# Patient Record
Sex: Female | Born: 1985 | Race: Black or African American | Hispanic: No | Marital: Single | Smoking: Former smoker
Health system: Southern US, Community
[De-identification: ages and names within clinical notes are randomized; demographics above are authoritative.]

## PROBLEM LIST (undated history)

## (undated) ENCOUNTER — Inpatient Hospital Stay (HOSPITAL_COMMUNITY): Payer: Self-pay

## (undated) ENCOUNTER — Ambulatory Visit (HOSPITAL_COMMUNITY): Admission: EM

## (undated) DIAGNOSIS — R87629 Unspecified abnormal cytological findings in specimens from vagina: Secondary | ICD-10-CM

## (undated) DIAGNOSIS — R569 Unspecified convulsions: Secondary | ICD-10-CM

## (undated) DIAGNOSIS — F419 Anxiety disorder, unspecified: Secondary | ICD-10-CM

## (undated) DIAGNOSIS — B009 Herpesviral infection, unspecified: Secondary | ICD-10-CM

## (undated) DIAGNOSIS — C801 Malignant (primary) neoplasm, unspecified: Secondary | ICD-10-CM

## (undated) DIAGNOSIS — O24419 Gestational diabetes mellitus in pregnancy, unspecified control: Secondary | ICD-10-CM

## (undated) DIAGNOSIS — I739 Peripheral vascular disease, unspecified: Secondary | ICD-10-CM

## (undated) DIAGNOSIS — G4730 Sleep apnea, unspecified: Secondary | ICD-10-CM

## (undated) DIAGNOSIS — K760 Fatty (change of) liver, not elsewhere classified: Secondary | ICD-10-CM

## (undated) DIAGNOSIS — G43909 Migraine, unspecified, not intractable, without status migrainosus: Secondary | ICD-10-CM

## (undated) DIAGNOSIS — K219 Gastro-esophageal reflux disease without esophagitis: Secondary | ICD-10-CM

## (undated) DIAGNOSIS — Z87440 Personal history of urinary (tract) infections: Secondary | ICD-10-CM

## (undated) DIAGNOSIS — Z8719 Personal history of other diseases of the digestive system: Secondary | ICD-10-CM

## (undated) DIAGNOSIS — F32A Depression, unspecified: Secondary | ICD-10-CM

## (undated) DIAGNOSIS — K449 Diaphragmatic hernia without obstruction or gangrene: Secondary | ICD-10-CM

## (undated) HISTORY — DX: Anxiety disorder, unspecified: F41.9

## (undated) HISTORY — DX: Depression, unspecified: F32.A

## (undated) HISTORY — DX: Fatty (change of) liver, not elsewhere classified: K76.0

## (undated) HISTORY — PX: DILATION AND CURETTAGE OF UTERUS: SHX78

## (undated) HISTORY — DX: Personal history of urinary (tract) infections: Z87.440

## (undated) HISTORY — PX: ABDOMINAL HYSTERECTOMY: SHX81

## (undated) HISTORY — DX: Migraine, unspecified, not intractable, without status migrainosus: G43.909

## (undated) HISTORY — DX: Unspecified abnormal cytological findings in specimens from vagina: R87.629

## (undated) HISTORY — DX: Sleep apnea, unspecified: G47.30

## (undated) HISTORY — DX: Diaphragmatic hernia without obstruction or gangrene: K44.9

## (undated) HISTORY — DX: Gestational diabetes mellitus in pregnancy, unspecified control: O24.419

---
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
## 2007-01-03 ENCOUNTER — Emergency Department (HOSPITAL_COMMUNITY): Admission: EM | Admit: 2007-01-03 | Discharge: 2007-01-03 | Payer: Self-pay | Admitting: Emergency Medicine

## 2008-01-11 ENCOUNTER — Emergency Department (HOSPITAL_COMMUNITY): Admission: EM | Admit: 2008-01-11 | Discharge: 2008-01-11 | Payer: Self-pay | Admitting: Emergency Medicine

## 2008-02-06 ENCOUNTER — Inpatient Hospital Stay (HOSPITAL_COMMUNITY): Admission: AD | Admit: 2008-02-06 | Discharge: 2008-02-06 | Payer: Self-pay | Admitting: Obstetrics & Gynecology

## 2008-07-24 ENCOUNTER — Inpatient Hospital Stay (HOSPITAL_COMMUNITY): Admission: AD | Admit: 2008-07-24 | Discharge: 2008-07-24 | Payer: Self-pay | Admitting: Obstetrics and Gynecology

## 2008-08-04 ENCOUNTER — Inpatient Hospital Stay (HOSPITAL_COMMUNITY): Admission: AD | Admit: 2008-08-04 | Discharge: 2008-08-04 | Payer: Self-pay | Admitting: Obstetrics and Gynecology

## 2008-10-01 ENCOUNTER — Inpatient Hospital Stay (HOSPITAL_COMMUNITY): Admission: AD | Admit: 2008-10-01 | Discharge: 2008-10-04 | Payer: Self-pay | Admitting: Obstetrics and Gynecology

## 2009-04-18 ENCOUNTER — Observation Stay (HOSPITAL_COMMUNITY): Admission: AD | Admit: 2009-04-18 | Discharge: 2009-04-19 | Payer: Self-pay | Admitting: Obstetrics and Gynecology

## 2009-04-21 HISTORY — PX: WISDOM TOOTH EXTRACTION: SHX21

## 2009-04-24 ENCOUNTER — Observation Stay (HOSPITAL_COMMUNITY): Admission: AD | Admit: 2009-04-24 | Discharge: 2009-04-27 | Payer: Self-pay | Admitting: Obstetrics and Gynecology

## 2009-04-25 ENCOUNTER — Ambulatory Visit (HOSPITAL_COMMUNITY): Admission: RE | Admit: 2009-04-25 | Discharge: 2009-04-25 | Payer: Self-pay | Admitting: Obstetrics and Gynecology

## 2009-05-02 ENCOUNTER — Inpatient Hospital Stay (HOSPITAL_COMMUNITY): Admission: AD | Admit: 2009-05-02 | Discharge: 2009-05-02 | Payer: Self-pay | Admitting: Obstetrics and Gynecology

## 2009-05-03 ENCOUNTER — Inpatient Hospital Stay (HOSPITAL_COMMUNITY): Admission: AD | Admit: 2009-05-03 | Discharge: 2009-05-03 | Payer: Self-pay | Admitting: Obstetrics and Gynecology

## 2009-05-13 ENCOUNTER — Inpatient Hospital Stay (HOSPITAL_COMMUNITY): Admission: AD | Admit: 2009-05-13 | Discharge: 2009-05-13 | Payer: Self-pay | Admitting: Obstetrics and Gynecology

## 2009-05-23 ENCOUNTER — Inpatient Hospital Stay (HOSPITAL_COMMUNITY): Admission: AD | Admit: 2009-05-23 | Discharge: 2009-05-23 | Payer: Self-pay | Admitting: Obstetrics and Gynecology

## 2009-06-17 ENCOUNTER — Inpatient Hospital Stay (HOSPITAL_COMMUNITY): Admission: AD | Admit: 2009-06-17 | Discharge: 2009-06-17 | Payer: Self-pay | Admitting: Obstetrics and Gynecology

## 2009-06-30 ENCOUNTER — Inpatient Hospital Stay (HOSPITAL_COMMUNITY): Admission: AD | Admit: 2009-06-30 | Discharge: 2009-06-30 | Payer: Self-pay | Admitting: Obstetrics and Gynecology

## 2009-07-17 ENCOUNTER — Encounter: Admission: RE | Admit: 2009-07-17 | Discharge: 2009-08-06 | Payer: Self-pay | Admitting: Obstetrics and Gynecology

## 2009-07-18 ENCOUNTER — Inpatient Hospital Stay (HOSPITAL_COMMUNITY): Admission: AD | Admit: 2009-07-18 | Discharge: 2009-07-18 | Payer: Self-pay | Admitting: Obstetrics and Gynecology

## 2009-07-26 ENCOUNTER — Inpatient Hospital Stay (HOSPITAL_COMMUNITY): Admission: AD | Admit: 2009-07-26 | Discharge: 2009-07-26 | Payer: Self-pay | Admitting: Obstetrics and Gynecology

## 2009-08-04 ENCOUNTER — Inpatient Hospital Stay (HOSPITAL_COMMUNITY): Admission: AD | Admit: 2009-08-04 | Discharge: 2009-08-06 | Payer: Self-pay | Admitting: Obstetrics and Gynecology

## 2009-08-09 ENCOUNTER — Inpatient Hospital Stay (HOSPITAL_COMMUNITY): Admission: AD | Admit: 2009-08-09 | Discharge: 2009-08-11 | Payer: Self-pay | Admitting: Obstetrics and Gynecology

## 2009-11-26 ENCOUNTER — Emergency Department (HOSPITAL_COMMUNITY): Admission: EM | Admit: 2009-11-26 | Discharge: 2009-11-26 | Payer: Self-pay | Admitting: Family Medicine

## 2010-01-27 ENCOUNTER — Emergency Department (HOSPITAL_COMMUNITY): Admission: EM | Admit: 2010-01-27 | Discharge: 2010-01-28 | Payer: Self-pay | Admitting: Emergency Medicine

## 2010-03-28 ENCOUNTER — Inpatient Hospital Stay (HOSPITAL_COMMUNITY): Admission: AD | Admit: 2010-03-28 | Discharge: 2009-08-02 | Payer: Self-pay | Admitting: Obstetrics and Gynecology

## 2010-07-07 LAB — URINALYSIS, ROUTINE W REFLEX MICROSCOPIC
Glucose, UA: NEGATIVE mg/dL
Glucose, UA: 100 mg/dL — AB
Hgb urine dipstick: NEGATIVE
Hgb urine dipstick: NEGATIVE
Ketones, ur: NEGATIVE mg/dL
Nitrite: NEGATIVE
Protein, ur: NEGATIVE mg/dL
Specific Gravity, Urine: <1.005 — ABNORMAL LOW (ref 1.005–1.030)
Urobilinogen, UA: 0.2 mg/dL (ref 0.0–1.0)
pH: 6 (ref 5.0–8.0)

## 2010-07-07 LAB — WET PREP, GENITAL
Trich, Wet Prep: NONE SEEN
Yeast Wet Prep HPF POC: NONE SEEN

## 2010-07-07 LAB — GC/CHLAMYDIA PROBE AMP, GENITAL: GC Probe Amp, Genital: NEGATIVE

## 2010-07-07 LAB — STREP B DNA PROBE: Strep Group B Ag: NEGATIVE

## 2010-07-07 LAB — FETAL FIBRONECTIN: Fetal Fibronectin: NEGATIVE

## 2010-07-09 LAB — CULTURE, BLOOD (ROUTINE X 2)

## 2010-07-09 LAB — CBC
Hemoglobin: 10.3 g/dL — ABNORMAL LOW (ref 12.0–15.0)
MCHC: 33.6 g/dL (ref 30.0–36.0)
MCHC: 33.6 g/dL (ref 30.0–36.0)
MCHC: 33.9 g/dL (ref 30.0–36.0)
MCV: 88.1 fL (ref 78.0–100.0)
MCV: 88.5 fL (ref 78.0–100.0)
MCV: 88.8 fL (ref 78.0–100.0)
Platelets: 268 10*3/uL (ref 150–400)
RBC: 3.34 MIL/uL — ABNORMAL LOW (ref 3.87–5.11)
RBC: 3.45 MIL/uL — ABNORMAL LOW (ref 3.87–5.11)
RDW: 13.6 % (ref 11.5–15.5)
WBC: 8.2 10*3/uL (ref 4.0–10.5)

## 2010-07-09 LAB — DIFFERENTIAL
Basophils Relative: 0 % (ref 0–1)
Eosinophils Absolute: 0 10*3/uL (ref 0.0–0.7)
Neutrophils Relative %: 76 % (ref 43–77)

## 2010-07-09 LAB — RPR: RPR Ser Ql: NONREACTIVE

## 2010-07-09 LAB — CCBB MATERNAL DONOR DRAW

## 2010-07-09 LAB — WOUND CULTURE

## 2010-07-10 LAB — GLUCOSE, CAPILLARY: Glucose-Capillary: 103 mg/dL — ABNORMAL HIGH (ref 70–99)

## 2010-07-10 LAB — URINALYSIS, ROUTINE W REFLEX MICROSCOPIC
Glucose, UA: >1000 mg/dL — AB
Glucose, UA: NEGATIVE mg/dL
Hgb urine dipstick: NEGATIVE
Ketones, ur: NEGATIVE mg/dL
Ketones, ur: NEGATIVE mg/dL
Nitrite: NEGATIVE
Nitrite: NEGATIVE
Protein, ur: NEGATIVE mg/dL
Protein, ur: NEGATIVE mg/dL
Urobilinogen, UA: 1 mg/dL (ref 0.0–1.0)
pH: 5.5 (ref 5.0–8.0)
pH: 6 (ref 5.0–8.0)

## 2010-07-10 LAB — WET PREP, GENITAL: Clue Cells Wet Prep HPF POC: NONE SEEN

## 2010-07-10 LAB — GC/CHLAMYDIA PROBE AMP, GENITAL
Chlamydia, DNA Probe: NEGATIVE
GC Probe Amp, Genital: NEGATIVE

## 2010-07-10 LAB — URINE MICROSCOPIC-ADD ON

## 2010-07-14 LAB — URINALYSIS, ROUTINE W REFLEX MICROSCOPIC
Glucose, UA: 500 mg/dL — AB
Glucose, UA: 500 mg/dL — AB
Ketones, ur: NEGATIVE mg/dL
Nitrite: NEGATIVE
Nitrite: NEGATIVE
Protein, ur: NEGATIVE mg/dL
Urobilinogen, UA: 1 mg/dL (ref 0.0–1.0)
pH: 6 (ref 5.0–8.0)

## 2010-07-14 LAB — URINE CULTURE

## 2010-07-14 LAB — GC/CHLAMYDIA PROBE AMP, GENITAL: GC Probe Amp, Genital: NEGATIVE

## 2010-07-22 LAB — CBC
HCT: 33.1 % — ABNORMAL LOW (ref 36.0–46.0)
Hemoglobin: 11.1 g/dL — ABNORMAL LOW (ref 12.0–15.0)
Platelets: 314 10*3/uL (ref 150–400)
RDW: 12.8 % (ref 11.5–15.5)
WBC: 10 10*3/uL (ref 4.0–10.5)

## 2010-07-22 LAB — STREP B DNA PROBE: Strep Group B Ag: NEGATIVE

## 2010-07-29 LAB — CBC
Hemoglobin: 12.2 g/dL (ref 12.0–15.0)
MCHC: 33.9 g/dL (ref 30.0–36.0)
MCV: 91.2 fL (ref 78.0–100.0)
Platelets: 290 10*3/uL (ref 150–400)
RBC: 3.24 MIL/uL — ABNORMAL LOW (ref 3.87–5.11)
RBC: 4.02 MIL/uL (ref 3.87–5.11)
RDW: 14.5 % (ref 11.5–15.5)
WBC: 16.2 10*3/uL — ABNORMAL HIGH (ref 4.0–10.5)

## 2010-07-29 LAB — RPR: RPR Ser Ql: NONREACTIVE

## 2010-07-31 LAB — URINALYSIS, ROUTINE W REFLEX MICROSCOPIC
Glucose, UA: 100 mg/dL — AB
Hgb urine dipstick: NEGATIVE
Nitrite: NEGATIVE
Nitrite: NEGATIVE
Protein, ur: NEGATIVE mg/dL
Specific Gravity, Urine: <1.005 — ABNORMAL LOW (ref 1.005–1.030)
Specific Gravity, Urine: >1.03 — ABNORMAL HIGH (ref 1.005–1.030)
Urobilinogen, UA: 0.2 mg/dL (ref 0.0–1.0)
pH: 6 (ref 5.0–8.0)

## 2010-07-31 LAB — URINE MICROSCOPIC-ADD ON

## 2010-07-31 LAB — URINE CULTURE

## 2010-07-31 LAB — FETAL FIBRONECTIN: Fetal Fibronectin: NEGATIVE

## 2010-09-03 NOTE — H&P (Signed)
NAME:  Holly Brown, Holly Brown NO.:  [id number]   MEDICAL RECORD NO.:  [id number]          PATIENT TYPE:  INP   LOCATION:  9173                          FACILITY:  [facility]   PHYSICIAN:  Osborn Coho, M.D.   DATE OF BIRTH:  1985/09/05   DATE OF ADMISSION:  10/01/2008  DATE OF DISCHARGE:                              HISTORY & PHYSICAL   Holly Brown is a 25 year old, gravida 2, para 0 at 40.[redacted] weeks gestation  who presents in early active labor.  She is followed by the midwives at  [facility].  Her pregnancy is remarkable for:  1. Chronic constipation.  2. History of idiopathic seizures.  3. First trimester Trichomonas  4. Third trimester urinary tract infection and yeast infection.  5. Positive GBS.   PERTINENT LABS:  [name] initial hemoglobin was 13.7, hematocrit  40.5, platelets 364,000, blood type A+, Rh antibody screen negative, RPR  nonreactive x2.  Rubella titer immune, hepatitis B negative, HIV  negative, negative Pap smear, negative gonorrhea, negative Chlamydia,  positive Trichomonas on Pap at new OB visit.  Normal alpha fetoprotein  screen.  Normal 1-hour Glucola at 110 mg/dL, a 81-XBJY hemoglobin 78.2,  RPR negative at that time as well and at 38 weeks 5 days, she had  cultures that were negative for gonorrhea and chlamydia but positive for  beta strep.   CURRENT MEDICATIONS:  Prenatal vitamin only although she recently  finished a course of Macrobid for UTI and Terazol for a vaginal yeast  infection.   ALLERGIES:  CODEINE which causes swelling.   HISTORY OF PRESENT PREGNANCY:  Holly Brown began her prenatal care in  the first trimester with an interview at around [redacted] weeks gestation.  She  had her new OB exam at 10-1/2 weeks.  She was having some dental  problems with impacted wisdom teeth, upper right and the lower left and  was on antibiotics for that and never did have them extracted.  There  was some chronic constipation issues.  At the  new OB visit, she did have  some glycosuria spilling +2 glucose in her urine dip and this went on  for the whole pregnancy even though she had a normal Glucola test.  In  the first trimester she was treated for the Trichomonas that appeared on  her Pap smear.  She had upper respiratory infection at 17 weeks for  which she took some Tussionex.  She had a normal AFP test and then at 19  weeks she had an ultrasound which showed a single intrauterine  pregnancy, size consistent with dates and anterior placenta, three-  vessel cord, and normal anatomy and cervix was 3.37 cm long and she  started having some chest pain and some burning.  She never did take any  antacids for that.  At 28 weeks she had a normal Glucola which was 110.  However, the previous visit she was spilling +4 glucose.  She had normal  Dextrostix of 94.  Her chest pain continued intermittently and she began  to have pedal edema in the second trimester.  Her hemoglobin remained  stable since that time at 11.11, 11.1.  At 38.5 weeks she was being  treated for UTI with Macrobid.  She had been given a prescription and  lost it for 2 weeks and then tried taking it.  At that time Chlamydia  and gonorrhea and GBS cultures were taken and GBS came back positive.  She had an active yeast infection and was given Terazol 7 to take for  the remainder of the Macrobid course.  The last month has been  unremarkable other than ongoing glycosuria with normal Dextrostix.   MEDICAL HISTORY:  1. Holly Brown began menstruation at age 44 with cycles occurring      every month, lasting 4 to 7 days with a normal amount of flow.  Her      contraception use has included Depo-Provera.  She did have a      spontaneous loss that required a dilatation and curettage.  There      was in 2006.  2. She had Trichomonas on her Pap with this pregnancy.  3. No history of any other sexually transmitted infection.  4. She has chronic constipation.  5. She has  occasional bladder infections including one with the      pregnancy.  6. She had a history of the one seizure which did not recur during the      pregnancy.   FAMILY HISTORY:  Maternal grandmother, paternal grandmother with high  blood pressure, paternal aunt with varicosities, maternal grandmother  with anemia.  Maternal grandmother with type 2 diabetes.  Her mother has  hypothyroid.  Her  mother has had a stroke.  Her paternal grandmother  has Alzheimer's disease.  Her father has lung cancer.  Two  paternal  aunts have lung cancer and maternal grandmother has breast cancer after  menopause.   SURGICAL HISTORY:  The only surgery was a dilatation and curettage 2006.   GENETIC HISTORY:  Genetic screen is noncontributory.  There is no record  of the sickle cell screen that was mentioned in the notes and cannot be  found in the computer results.  She did have a normal AFP, however.   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  Father of the baby is listed as a friend and not really  involved in a relationship with him.  His name is Wellsite geologist.  He has a high  school education and works as a Curator.  The patient denies any use of  alcohol, tobacco or street drugs during the pregnancy.   PHYSICAL EXAMINATION:  GENERAL:  Within normal limits.  VITAL SIGNS:  Afebrile and has stable vital signs.  The fetal heart rate  is 160 and variability is present, but there are no accelerations to  meet the criteria for reactivity.  However, there is a negative  contraction stress test  in absence of late decelerations and late  contractions.  Contractions are occurring every 2-3 minutes, lasting 50  to 110 seconds, moderate to strong.  Her cervix is found to be 3-4 cm  dilated, 90% effaced, minus 1, vertex, with a bulging bag of waters,  soft and stretchy.  LUNGS:  Clear to auscultation bilaterally.  HEART:  Regular rate and rhythm.  No murmur.  EXTREMITIES:  Normal.   ABDOMEN:  Soft and gravid, nontender, appropriate for gestational age.   IMPRESSION:  A 25 year old gravida 2, para 0 at 40.3 weeks in early  active labor, positive group B  strep, a nonreactive tracing with a  negative CST.   PLAN:  Admit to CNM Services, penicillin per protocol for the GBS.  Dr.  Su Hilt is notified.      Eulogio Bear, CNM      Osborn Coho, M.D.  Electronically Signed    JM/MEDQ  D:  10/01/2008  T:  10/02/2008  Job:  [id number]

## 2010-09-06 NOTE — Op Note (Signed)
NAME:  Holly Brown, Holly Brown             ACCOUNT NO.:  [id number]   MEDICAL RECORD NO.:  [id number]          PATIENT TYPE:  AMB   LOCATION:  SDC                           [facility]ILITY:  [facility]   PHYSICIAN:  Hal Morales, M.D.DATE OF BIRTH:  January 18, 1986   DATE OF PROCEDURE:  03/08/2005  DATE OF DISCHARGE:  03/08/2005                                 OPERATIVE REPORT   PREOPERATIVE DIAGNOSIS:  Missed abortion.   POSTOPERATIVE DIAGNOSIS:  Missed abortion.   OPERATION:  Suction dilatation and evacuation.   SURGEON:  Vanessa P. Pennie Rushing, M.D.   ANESTHESIA:  Monitored anesthesia care and local.   ESTIMATED BLOOD LOSS:  Less than 10 mL.   COMPLICATIONS:  None.   FINDINGS:  The patient had a moderate amount of products of conception at  the time of D&E.   SPECIMENS TO PATHOLOGY:  Products of conception.   PROCEDURE:  The patient was taken to the operating room after appropriate  identification and placed on the operating table.  After placement of  equipment for monitored anesthesia care, she was placed in the lithotomy  position.  The perineum and vagina were prepped with multiple layers of  Betadine and a red Robinson catheter used to empty the bladder.  The  perineum was draped as a sterile field.  A Graves speculum was placed in the  vagina and a single-tooth tenaculum placed on the anterior cervix.  A  paracervical block with 10 mL of 2% Xylocaine was achieved with injections  in the 5 and 7 o'clock positions.  The cervix was then dilated to  accommodate a #8 suction curette.  This was used to suction-evacuate all  quadrants of the uterus.  Sharp curettage of the uterus revealed that  products of conception had been completely removed.  All instruments were  then removed from the vagina and the patient taken from the operating room  to the recovery room in satisfactory condition, having tolerated the  procedure well with sponge and instrument counts correct.  Blood type A   positive.      Hal Morales, M.D.  Electronically Signed     VPH/MEDQ  D:  05/16/2005  T:  05/16/2005  Job:  [id number]

## 2010-09-06 NOTE — Procedures (Signed)
EEG NUMBER:  [id number].   PERFORMED AND DICTATED:  June 30, 2005.   CLINICAL HISTORY:  A 25 year old who had a seizure a month ago described as  generalized tonic-clonic seizure. Study is being done to look for the  presence of seizures.   PROCEDURE:  The tracing is carried out on a 32-channel digital Cadwell  recorder reformatted into 16-channel montages with one devoted to EKG. The  patient was awake during the recording. The International 10/20 system lead  placement was used.   The patient takes no medication.   DESCRIPTION OF FINDINGS:  Dominant frequency is a 10 Hz 30-40 microvolt  activity that is well regulated and attenuates partially with eye opening.   Background activity is a mixture of predominantly alpha and beta range  activity.  The patient becomes drowsy with generalized 30 microvolt theta  range components. Light natural sleep did not occur.   Photic stimulation and hyperventilation caused no significant change. There  was a single sharp wave at C3 on page 69. This was not definitely  epileptogenic from an electrographic viewpoint.   EKG showed a regular sinus rhythm with ventricular response of 69 beats per  minute.   IMPRESSION:  Normal record with the patient awake and drowsy.      Deanna Artis. Sharene Skeans, M.D.  Electronically Signed     ZOX:WRUE  D:  06/30/2005 18:14:10  T:  07/01/2005 20:17:39  Job #:  [id number]   cc:   [facility]  [address] [place], [place] [address]
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
## 2010-11-22 ENCOUNTER — Ambulatory Visit (INDEPENDENT_AMBULATORY_CARE_PROVIDER_SITE_OTHER): Payer: Self-pay

## 2010-11-22 ENCOUNTER — Inpatient Hospital Stay (INDEPENDENT_AMBULATORY_CARE_PROVIDER_SITE_OTHER)
Admission: RE | Admit: 2010-11-22 | Discharge: 2010-11-22 | Disposition: A | Discharge status: Home / Self Care | Payer: Self-pay | Source: Ambulatory Visit | Attending: Family Medicine | Admitting: Family Medicine

## 2010-11-22 DIAGNOSIS — IMO0002 Reserved for concepts with insufficient information to code with codable children: Secondary | ICD-10-CM

## 2011-01-20 LAB — URINALYSIS, ROUTINE W REFLEX MICROSCOPIC
Hgb urine dipstick: NEGATIVE
Nitrite: NEGATIVE
Protein, ur: NEGATIVE
Specific Gravity, Urine: >1.03 — ABNORMAL HIGH
Urobilinogen, UA: 0.2

## 2011-01-31 LAB — LIPASE, BLOOD: Lipase: 16

## 2011-01-31 LAB — URINALYSIS, ROUTINE W REFLEX MICROSCOPIC
Bilirubin Urine: NEGATIVE
Glucose, UA: NEGATIVE
Ketones, ur: NEGATIVE
Nitrite: NEGATIVE
Protein, ur: NEGATIVE
pH: 6

## 2011-01-31 LAB — COMPREHENSIVE METABOLIC PANEL
Albumin: 3.9
BUN: 7
Calcium: 9
Glucose, Bld: 73
Total Protein: 6.6

## 2011-01-31 LAB — PREGNANCY, URINE: Preg Test, Ur: NEGATIVE

## 2011-01-31 LAB — CBC
HCT: 38.8
Hemoglobin: 13.2
MCHC: 34.1
Platelets: 331
RDW: 13.2

## 2011-01-31 LAB — DIFFERENTIAL
Lymphocytes Relative: 44
Lymphs Abs: 3.2
Monocytes Relative: 8
Neutro Abs: 3.4
Neutrophils Relative %: 46

## 2011-02-27 ENCOUNTER — Encounter (HOSPITAL_COMMUNITY): Payer: Self-pay | Admitting: *Deleted

## 2011-02-27 ENCOUNTER — Inpatient Hospital Stay (HOSPITAL_COMMUNITY)
Admission: AD | Admit: 2011-02-27 | Discharge: 2011-02-27 | Disposition: A | Discharge status: Home / Self Care | Payer: Self-pay | Source: Ambulatory Visit | Attending: Obstetrics and Gynecology | Admitting: Obstetrics and Gynecology

## 2011-02-27 DIAGNOSIS — N760 Acute vaginitis: Secondary | ICD-10-CM | POA: Insufficient documentation

## 2011-02-27 DIAGNOSIS — B9689 Other specified bacterial agents as the cause of diseases classified elsewhere: Secondary | ICD-10-CM | POA: Insufficient documentation

## 2011-02-27 DIAGNOSIS — N390 Urinary tract infection, site not specified: Secondary | ICD-10-CM | POA: Insufficient documentation

## 2011-02-27 DIAGNOSIS — A499 Bacterial infection, unspecified: Secondary | ICD-10-CM | POA: Insufficient documentation

## 2011-02-27 LAB — URINALYSIS, ROUTINE W REFLEX MICROSCOPIC
Ketones, ur: NEGATIVE mg/dL
Nitrite: NEGATIVE
Specific Gravity, Urine: 1.025 (ref 1.005–1.030)
pH: 7.5 (ref 5.0–8.0)

## 2011-02-27 LAB — URINE MICROSCOPIC-ADD ON

## 2011-02-27 LAB — WET PREP, GENITAL: Trich, Wet Prep: NONE SEEN

## 2011-02-27 MED ORDER — MEDROXYPROGESTERONE ACETATE 150 MG/ML IM SUSP
150.0000 mg | Freq: Once | INTRAMUSCULAR | Status: AC
  Administered 2011-02-27: 150 mg via INTRAMUSCULAR
  Filled 2011-02-27: qty 1

## 2011-02-27 NOTE — Progress Notes (Signed)
Patient states she started having pain with urination last night. Feels like something is going to fall out with urination.

## 2011-02-27 NOTE — ED Provider Notes (Signed)
History   25 yo G3P2012 presented unannounced c/o urinary pressure since last night. Denies fever, chills, crampin.  Does report vaginal discharge, without odor or irritation.  Had Nexplanon removed in early 2012, not using contraception since, but considering Depo.  Does not desire pregnancy.  Hx of irregular periods, with resumption of irregular bleeding since Nexplanon removed.  Consistent partner.  CSN: [id number] Arrival date & time: 02/27/2011 10:38 AM   None     Chief Complaint  Patient presents with  . Dysuria    HPI Past Medical History  Diagnosis Date  . No pertinent past medical history     Past Surgical History  Procedure Date  . Wisdom tooth extraction 2011    No family history on file.  History  Substance Use Topics  . Smoking status: Current Everyday Smoker -- 0.2 packs/day    Types: Cigarettes  . Smokeless tobacco: Not on file  . Alcohol Use: Yes     occasional social    OB History    Grav Para Term Preterm Abortions TAB SAB Ect Mult Living   3 2 2  1  1   2       Review of Systems  Constitutional: Negative.   HENT: Negative.   Eyes: Negative.   Respiratory: Negative.   Cardiovascular: Negative.   Gastrointestinal: Negative.   Genitourinary: Positive for difficulty urinating (urinary pressure with voiding). Vaginal discharge: thin, yellow d/c.  Musculoskeletal: Negative.   Skin: Negative.   Neurological: Negative.   Hematological: Negative.   Psychiatric/Behavioral: Negative.     Allergies  Review of patient's allergies indicates no known allergies.  Home Medications   Current Outpatient Rx  Name Route Sig Dispense Refill  . NAPROXEN SODIUM 220 MG PO TABS Oral Take 220 mg by mouth 2 (two) times daily with a meal. For headache       BP 119/68  Pulse 74  Temp(Src) 98.6 F (37 C) (Oral)  Resp 18  Ht 5' 9.5" (1.765 m)  Wt 86.909 kg (191 lb 9.6 oz)  BMI 27.89 kg/m2  SpO2 97%  Physical Exam  Constitutional: She is oriented to  person, place, and time. She appears well-developed and well-nourished.  HENT:  Head: Normocephalic.  Eyes: Conjunctivae are normal. Pupils are equal, round, and reactive to light.  Neck: Normal range of motion. Neck supple.  Cardiovascular: Normal rate and regular rhythm.   Pulmonary/Chest: Effort normal and breath sounds normal.  Abdominal: Soft. Bowel sounds are normal.  Genitourinary: Vagina normal. Vaginal discharge: thin, yellow vaginal discharge.  Musculoskeletal: Normal range of motion.  Neurological: She is alert and oriented to person, place, and time.  Skin: Skin is warm and dry.  Psychiatric: She has a normal mood and affect. Her behavior is normal. Judgment and thought content normal.  Pelvic:  No CMT, small amount discharge in vagina Adnexa WNL, uterus small, NT  ED Course  Procedures GC, chlamydia done Wet prep UA, sent to culture Given DepoProvera 150 mg IM in MAU  Labs Reviewed  URINALYSIS, ROUTINE W REFLEX MICROSCOPIC - Abnormal; Notable for the following:    Appearance HAZY (*)    Glucose, UA 100 (*)    Hgb urine dipstick LARGE (*)    Urobilinogen, UA 2.0 (*)    Leukocytes, UA MODERATE (*)    All other components within normal limits  URINE MICROSCOPIC-ADD ON - Abnormal; Notable for the following:    Squamous Epithelial / LPF FEW (*)    All other components within normal  limits  WET PREP, GENITAL - Abnormal; Notable for the following:    Clue Cells, Wet Prep MANY (*)    WBC, Wet Prep HPF POC MODERATE (*) MODERATE BACTERIA SEEN   All other components within normal limits  POCT PREGNANCY, URINE  URINE CULTURE  GC/CHLAMYDIA PROBE AMP, GENITAL   No results found.   1. BV (bacterial vaginosis)   2. UTI (lower urinary tract infection)       MDM  D/C'd home, with instructions regarding UTI and BV Rx Keflex 500 mg po BID x 7 days, Metronidazole 500 mg po BID x 7 days--called to [facility] on [address] for $4 Rxs DepoProvera 150 mg IM now--call office to  schedule repeat dose in 3 months. Will follow-up on urine culture.  Nigel Bridgeman, CNM 02/27/11 1355

## 2011-02-27 NOTE — Progress Notes (Signed)
CNM notified of wet prep results, DC order received.

## 2011-02-28 LAB — GC/CHLAMYDIA PROBE AMP, GENITAL: Chlamydia, DNA Probe: POSITIVE — AB

## 2011-03-01 LAB — URINE CULTURE

## 2011-06-04 ENCOUNTER — Emergency Department (HOSPITAL_COMMUNITY)
Admission: EM | Admit: 2011-06-04 | Discharge: 2011-06-04 | Disposition: A | Discharge status: Home / Self Care | Payer: Self-pay | Attending: Emergency Medicine | Admitting: Emergency Medicine

## 2011-06-04 ENCOUNTER — Encounter (HOSPITAL_COMMUNITY): Payer: Self-pay | Admitting: Emergency Medicine

## 2011-06-04 DIAGNOSIS — M7989 Other specified soft tissue disorders: Secondary | ICD-10-CM | POA: Insufficient documentation

## 2011-06-04 DIAGNOSIS — L2989 Other pruritus: Secondary | ICD-10-CM | POA: Insufficient documentation

## 2011-06-04 DIAGNOSIS — L298 Other pruritus: Secondary | ICD-10-CM | POA: Insufficient documentation

## 2011-06-04 DIAGNOSIS — M79609 Pain in unspecified limb: Secondary | ICD-10-CM | POA: Insufficient documentation

## 2011-06-04 DIAGNOSIS — R509 Fever, unspecified: Secondary | ICD-10-CM | POA: Insufficient documentation

## 2011-06-04 DIAGNOSIS — L02619 Cutaneous abscess of unspecified foot: Secondary | ICD-10-CM | POA: Insufficient documentation

## 2011-06-04 DIAGNOSIS — R209 Unspecified disturbances of skin sensation: Secondary | ICD-10-CM | POA: Insufficient documentation

## 2011-06-04 DIAGNOSIS — L03115 Cellulitis of right lower limb: Secondary | ICD-10-CM

## 2011-06-04 MED ORDER — CEPHALEXIN 500 MG PO CAPS: 500.0000 mg | ORAL_CAPSULE | Freq: Four times a day (QID) | ORAL | Status: AC

## 2011-06-04 MED ORDER — HYDROXYZINE HCL 25 MG PO TABS: 25.0000 mg | ORAL_TABLET | Freq: Four times a day (QID) | ORAL | Status: AC | PRN

## 2011-06-04 MED ORDER — HYDROCORTISONE 1 % EX CREA: TOPICAL_CREAM | CUTANEOUS | Status: DC

## 2011-06-04 NOTE — Discharge Instructions (Signed)
Please see your doctor, the urgent care, or return to the emergency department in two days for a recheck of your right foot.  If the redness or swelling increases in size or you continue to have fevers please return to the emergency department immediately.  You may return to the ER at any time for worsening condition or any new symptoms that concern you.   Cellulitis Cellulitis is an infection of the skin and the tissue beneath it. The area is typically red and tender. It is caused by germs (bacteria) (usually staph or strep) that enter the body through cuts or sores. Cellulitis most commonly occurs in the arms or lower legs.  HOME CARE INSTRUCTIONS   If you are given a prescription for medications which kill germs (antibiotics), take as directed until finished.   If the infection is on the arm or leg, keep the limb elevated as able.   Use a warm cloth several times per day to relieve pain and encourage healing.   See your caregiver for recheck of the infected site as directed if problems arise.   Only take over-the-counter or prescription medicines for pain, discomfort, or fever as directed by your caregiver.  SEEK MEDICAL CARE IF:   The area of redness (inflammation) is spreading, there are red streaks coming from the infected site, or if a part of the infection begins to turn dark in color.   The joint or bone underneath the infected skin becomes painful after the skin has healed.   The infection returns in the same or another area after it seems to have gone away.   A boil or bump swells up. This may be an abscess.   New, unexplained problems such as pain or fever develop.  SEEK IMMEDIATE MEDICAL CARE IF:   You have a fever.   You or your child feels drowsy or lethargic.   There is vomiting, diarrhea, or lasting discomfort or feeling ill (malaise) with muscle aches and pains.  MAKE SURE YOU:   Understand these instructions.   Will watch your condition.   Will get help  right away if you are not doing well or get worse.  Document Released: 01/15/2005 Document Revised: 12/18/2010 Document Reviewed: 11/24/2007 [facility] Patient Information 2012 [place], [place].

## 2011-06-04 NOTE — ED Notes (Signed)
Rt foot swelling since Friday burning and itching

## 2011-06-04 NOTE — ED Provider Notes (Signed)
History     CSN: [id number]  Arrival date & time 06/04/11  4098   First MD Initiated Contact with Patient 06/04/11 1004      Chief Complaint  Patient presents with  . Foot Pain    (Consider location/radiation/quality/duration/timing/severity/associated sxs/prior treatment) HPI Comments: The patient presents today with a 4 day history of right foot pain, itching, and swelling. Initially, she noticed a small "bump" on the dorsum of her foot that she thought might be an insect bite.  The area continued to swell and she developed fever to 102 two days ago.  Patient has used ibuprofen with minor improvement. Today, she reports mild resolution of the swelling but still experiences pressure due to the swelling, itching, and tingling exacerbated by walking. This morning, she noticed a similar area of of swelling on the volar aspect of her right wrist which is itching as well. She denies fever/chills, abdominal pain, and joint pain.   Patient is a 26 y.o. female presenting with lower extremity pain. The history is provided by the patient.  Foot Pain    Past Medical History  Diagnosis Date  . No pertinent past medical history     Past Surgical History  Procedure Date  . Wisdom tooth extraction 2011    No family history on file.  History  Substance Use Topics  . Smoking status: Current Everyday Smoker -- 0.2 packs/day    Types: Cigarettes  . Smokeless tobacco: Not on file  . Alcohol Use: Yes     occasional social    OB History    Grav Para Term Preterm Abortions TAB SAB Ect Mult Living   3 2 2  1  1   2       Review of Systems  All other systems reviewed and are negative.    Allergies  Review of patient's allergies indicates no known allergies.  Home Medications  No current outpatient prescriptions on file.  BP 128/73  Pulse 85  Temp 98.8 F (37.1 C)  SpO2 100%  Physical Exam  Nursing note and vitals reviewed. Constitutional: She is oriented to person, place,  and time. She appears well-developed and well-nourished.  HENT:  Head: Normocephalic and atraumatic.  Neck: Neck supple.  Cardiovascular: Normal rate and regular rhythm.   Pulmonary/Chest: Effort normal and breath sounds normal.  Musculoskeletal:       Right foot with erythema dorsally, mild edema.  Mild tenderness to palpation.  No discharge.  No excessive warmth.  Dorsalis pedis pulses is intact.  Pt with full AROM ankle and toes.  Capillary refill < 2 seconds.  No bony tenderness.    Neurological: She is alert and oriented to person, place, and time.  Skin:     Psychiatric: She has a normal mood and affect. Her behavior is normal. Judgment and thought content normal.    ED Course  Procedures (including critical care time)  Labs Reviewed - No data to display No results found.   1. Cellulitis of right foot       MDM  Nontoxic, afebrile patient with small area of erythema, edema to dorsum of right foot and small area of right ventral wrist.  Lesions have appearance of mild cellulitis with erythema, edema, and hx fever.  However, patient reports lesions are more pruritic than painful, declines pain medication.  Patient also has new lesion on left foot that has appearance of insect bite.  I am treating patient for cellulitis, though this may be an intense localized allergic  reaction to the insect bite.  Patient given medications for itching as well as infection.  Pt asked to return in two days for a recheck.  Patient verbalizes understanding and agrees with plan. Rise Patience, [place] 06/04/11 [phone]

## 2011-06-04 NOTE — ED Notes (Signed)
Pt received to [facility] wth c/o pain, redness and swelling to the top of her lt foot onset Friday. Pt denies any fever, no SOB. Pt is NAD

## 2011-06-05 NOTE — ED Provider Notes (Signed)
Medical screening examination/treatment/procedure(s) were performed by non-physician practitioner and as supervising physician I was immediately available for consultation/collaboration.   Gwyneth Sprout, MD 06/05/11 1501

## 2011-09-27 ENCOUNTER — Emergency Department (HOSPITAL_COMMUNITY)
Admission: EM | Admit: 2011-09-27 | Discharge: 2011-09-27 | Disposition: A | Discharge status: Home / Self Care | Payer: Medicaid Other | Attending: Emergency Medicine | Admitting: Emergency Medicine

## 2011-09-27 ENCOUNTER — Encounter (HOSPITAL_COMMUNITY): Payer: Self-pay | Admitting: *Deleted

## 2011-09-27 DIAGNOSIS — R45851 Suicidal ideations: Secondary | ICD-10-CM | POA: Insufficient documentation

## 2011-09-27 DIAGNOSIS — F329 Major depressive disorder, single episode, unspecified: Secondary | ICD-10-CM | POA: Insufficient documentation

## 2011-09-27 DIAGNOSIS — F3289 Other specified depressive episodes: Secondary | ICD-10-CM | POA: Insufficient documentation

## 2011-09-27 DIAGNOSIS — F32A Depression, unspecified: Secondary | ICD-10-CM

## 2011-09-27 LAB — RAPID URINE DRUG SCREEN, HOSP PERFORMED
Barbiturates: NOT DETECTED
Benzodiazepines: NOT DETECTED
Cocaine: NOT DETECTED
Opiates: NOT DETECTED

## 2011-09-27 LAB — COMPREHENSIVE METABOLIC PANEL
ALT: 11 U/L (ref 0–35)
Albumin: 4 g/dL (ref 3.5–5.2)
Alkaline Phosphatase: 54 U/L (ref 39–117)
Calcium: 9.6 mg/dL (ref 8.4–10.5)
Potassium: 3.5 mEq/L (ref 3.5–5.1)
Sodium: 138 mEq/L (ref 135–145)
Total Protein: 7.3 g/dL (ref 6.0–8.3)

## 2011-09-27 LAB — ETHANOL: Alcohol, Ethyl (B): <11 mg/dL (ref 0–11)

## 2011-09-27 LAB — URINALYSIS, ROUTINE W REFLEX MICROSCOPIC
Bilirubin Urine: NEGATIVE
Glucose, UA: NEGATIVE mg/dL
Nitrite: NEGATIVE
Specific Gravity, Urine: 1.033 — ABNORMAL HIGH (ref 1.005–1.030)
pH: 5.5 (ref 5.0–8.0)

## 2011-09-27 LAB — URINE MICROSCOPIC-ADD ON

## 2011-09-27 MED ORDER — ONDANSETRON HCL 4 MG PO TABS: 4.0000 mg | ORAL_TABLET | Freq: Three times a day (TID) | ORAL | Status: DC | PRN

## 2011-09-27 MED ORDER — ALUM & MAG HYDROXIDE-SIMETH 200-200-20 MG/5ML PO SUSP: 30.0000 mL | ORAL | Status: DC | PRN

## 2011-09-27 MED ORDER — MIRTAZAPINE 15 MG PO TABS: 15.0000 mg | ORAL_TABLET | Freq: Every day | ORAL | Status: DC

## 2011-09-27 MED ORDER — ACETAMINOPHEN 325 MG PO TABS: 650.0000 mg | ORAL_TABLET | ORAL | Status: DC | PRN

## 2011-09-27 NOTE — BH Assessment (Signed)
Assessment Note   Holly Brown is a 26 y.o. female brought in by police after texting her father's son that she was going to kill herself. She states that she had been arguing earlier in the evening with her ex over being able to visit her son. He refused to let her see him and she became upset. She states she called her sister stating that she was having suicidal ideation and then text her ex. She endorses depressive symptoms including crying spells, anhedonia, and feelings of worthlessness. She states she has had multiple recent stressors including finical difficulties and conflict with family. Pt explains that her family attempts to "run my life always telling me what I need to do but never helping me." She reports no close friends and was unable to identify any social supports. She states her family always "jokes telling me they think I'm I'm bipolar." She explains that she has had depressive symptoms off and on for several years but has never had mental health treatment. She states she feels stressed and overwhelmed. She reports a decrease in appetite and sleep over the past several weeks. She currently lives with her brother.   She denies HI, [facility], and SA. Pt appeared very blunted at the beginning of the assessment and then became tearful as she listed her recent stressors. Pt is currently voluntary and is apprehensive of receiving inpatient treatment.  Axis I: Major Depression, Recurrent severe Axis II: Deferred Axis III:  Past Medical History  Diagnosis Date  . No pertinent past medical history    Axis IV: economic problems, other psychosocial or environmental problems, problems related to social environment and problems with primary support group Axis V: 31-40 impairment in reality testing  Past Medical History:  Past Medical History  Diagnosis Date  . No pertinent past medical history     Past Surgical History  Procedure Date  . Wisdom tooth extraction 2011    Family  History: History reviewed. No pertinent family history.  Social History:  reports that she has been smoking Cigarettes.  She has been smoking about .25 packs per day. She does not have any smokeless tobacco history on file. She reports that she drinks alcohol. She reports that she does not use illicit drugs.  Additional Social History:  Alcohol / Drug Use History of alcohol / drug use?: No history of alcohol / drug abuse  CIWA: CIWA-Ar BP: 110/66 mmHg Pulse Rate: 77  COWS:    Allergies: No Known Allergies  Home Medications:  (Not in a hospital admission)  OB/GYN Status:  Patient's last menstrual period was 09/20/2011.  General Assessment Data Location of Assessment: [facility] ED Living Arrangements: Other relatives (brother) Can pt return to current living arrangement?: Yes Admission Status: Voluntary Is patient capable of signing voluntary admission?: Yes Transfer from: Acute Hospital Referral Source: Self/Family/Friend  Education Status Is patient currently in school?: No  Risk to self Suicidal Ideation: Yes-Currently Present Suicidal Intent: No Is patient at risk for suicide?: No Suicidal Plan?: No Access to Means: No What has been your use of drugs/alcohol within the last 12 months?: none Previous Attempts/Gestures: No How many times?: 0  Other Self Harm Risks: none Triggers for Past Attempts: None known Intentional Self Injurious Behavior: None Family Suicide History: No Recent stressful life event(s): Financial Problems;Conflict (Comment) (argument with family) Persecutory voices/beliefs?: No Depression: Yes Depression Symptoms: Feeling worthless/self pity;Fatigue;Isolating;Tearfulness;Despondent Substance abuse history and/or treatment for substance abuse?: No Suicide prevention information given to non-admitted patients: Not applicable  Risk to Others Homicidal Ideation: No Thoughts of Harm to Others: No Current Homicidal Intent: No Current Homicidal Plan:  No Access to Homicidal Means: No Identified Victim: none History of harm to others?: No Assessment of Violence: None Noted Violent Behavior Description: cooperative  Does patient have access to weapons?: No Criminal Charges Pending?: No Does patient have a court date: No  Psychosis Hallucinations: None noted Delusions: None noted  Mental Status Report Appear/Hygiene:  (casual) Eye Contact: Poor Motor Activity: Unremarkable Speech: Logical/coherent Level of Consciousness: Quiet/awake Mood: Depressed Affect: Blunted Anxiety Level: Severe Thought Processes: Coherent;Relevant Judgement: Impaired Orientation: Person;Place;Time;Situation Obsessive Compulsive Thoughts/Behaviors: None  Cognitive Functioning Concentration: Normal Memory: Recent Intact;Remote Intact IQ: Average Insight: Fair Impulse Control: Fair Appetite: Poor Weight Loss: 0  Weight Gain: 0  Sleep: Decreased Vegetative Symptoms: None  ADLScreening [facility]) Patient's cognitive ability adequate to safely complete daily activities?: Yes Patient able to express need for assistance with ADLs?: Yes Independently performs ADLs?: Yes  Abuse/Neglect [facility]) Physical Abuse: Denies Verbal Abuse: Denies Sexual Abuse: Denies  Prior Inpatient Therapy Prior Inpatient Therapy: No Prior Therapy Dates: na Prior Therapy Facilty/Provider(s): na Reason for Treatment: na  Prior Outpatient Therapy Prior Outpatient Therapy: No Prior Therapy Dates: na Prior Therapy Facilty/Provider(s): na Reason for Treatment: na  ADL Screening (condition at time of admission) Patient's cognitive ability adequate to safely complete daily activities?: Yes Patient able to express need for assistance with ADLs?: Yes Independently performs ADLs?: Yes Weakness of Legs: None Weakness of Arms/Hands: None  Home Assistive Devices/Equipment Home Assistive Devices/Equipment: None    Abuse/Neglect Assessment (Assessment to  be complete while patient is alone) Physical Abuse: Denies Verbal Abuse: Denies Sexual Abuse: Denies Exploitation of patient/patient's resources: Denies Self-Neglect: Denies Values / Beliefs Cultural Requests During Hospitalization: None Spiritual Requests During Hospitalization: None   Advance Directives (For Healthcare) Advance Directive: Patient does not have advance directive;Patient would not like information Pre-existing out of facility DNR order (yellow form or pink MOST form): No Nutrition Screen Diet: Regular Unintentional weight loss greater than 10lbs within the last month: No Problems chewing or swallowing foods and/or liquids: No Home Tube Feeding or Total Parenteral Nutrition (TPN): No Patient appears severely malnourished: No Pregnant or Lactating: No  Additional Information 1:1 In Past 12 Months?: No CIRT Risk: No Elopement Risk: No Does patient have medical clearance?: Yes     Disposition:  Disposition Disposition of Patient: Referred to;Inpatient treatment program Type of inpatient treatment program: Adult Pt has been referred to [facility] for inpatient behavioral health treatment. On Site Evaluation by:   Reviewed with Physician:     Georgina Quint A 09/27/2011 6:45 AM

## 2011-09-27 NOTE — ED Notes (Signed)
Awaiting pt's family to pick pt up, will monitor.

## 2011-09-27 NOTE — ED Provider Notes (Signed)
Telepsych consult notes that the patient is stable for discharge.  She was referred to community health organizations.  Gerhard Munch, MD 09/27/11 1429

## 2011-09-27 NOTE — ED Provider Notes (Addendum)
History     CSN: [id number]  Arrival date & time 09/27/11  4098   First MD Initiated Contact with Patient 09/27/11 0536      Chief Complaint  Patient presents with  . Suicidal    (Consider location/radiation/quality/duration/timing/severity/associated sxs/prior treatment) HPI This is a 26 year old black female who got an argument this morning with her son's father. She wanted her son to move in with her and he objected to this. She became upset and told him as well as her sister that she wanted to kill herself. Her son's father reported this to the police and they brought her here. She states that she didn't really mean it and that she no longer is having any suicidal thought. She states she has been "stressed out" for a long time. She admits to feeling depressed, anxious with insomnia and poor appetite. She denies acute somatic complaints including chest pain, abdominal pain, headache, fever, chills, nausea, vomiting or diarrhea. She denies hearing voices.  Past Medical History  Diagnosis Date  . No pertinent past medical history     Past Surgical History  Procedure Date  . Wisdom tooth extraction 2011    History reviewed. No pertinent family history.  History  Substance Use Topics  . Smoking status: Current Everyday Smoker -- 0.2 packs/day    Types: Cigarettes  . Smokeless tobacco: Not on file  . Alcohol Use: Yes     occasional social    OB History    Grav Para Term Preterm Abortions TAB SAB Ect Mult Living   3 2 2  1  1   2       Review of Systems  All other systems reviewed and are negative.    Allergies  Review of patient's allergies indicates no known allergies.  Home Medications  No current outpatient prescriptions on file.  BP 103/59  Pulse 68  Temp(Src) 97.7 F (36.5 C) (Oral)  Resp 22  Ht 5\' 9"  (1.753 m)  Wt 206 lb (93.441 kg)  BMI 30.42 kg/m2  SpO2 98%  LMP 09/20/2011  Physical Exam General: Well-developed, well-nourished female in no acute  distress; appearance consistent with age of record HENT: normocephalic, atraumatic Eyes: pupils equal round and reactive to light; extraocular muscles intact Neck: supple Heart: regular rate and rhythm Lungs: clear to auscultation bilaterally Abdomen: soft; nondistended Extremities: No deformity; full range of motion Neurologic: Awake, alert and oriented; motor function intact in all extremities and symmetric; no facial droop Skin: Warm and dry Psychiatric: Depressed with congruent affect    ED Course  Procedures (including critical care time)     MDM   Nursing notes and vitals signs, including pulse oximetry, reviewed.  Summary of this visit's results, reviewed by myself:  Labs:  Results for orders placed during the hospital encounter of 09/27/11  ETHANOL      Component Value Range   Alcohol, Ethyl (B) <11  0 - 11 (mg/dL)  COMPREHENSIVE METABOLIC PANEL      Component Value Range   Sodium 138  135 - 145 (mEq/L)   Potassium 3.5  3.5 - 5.1 (mEq/L)   Chloride 102  96 - 112 (mEq/L)   CO2 25  19 - 32 (mEq/L)   Glucose, Bld 100 (*) 70 - 99 (mg/dL)   BUN 10  6 - 23 (mg/dL)   Creatinine, Ser 1.19  0.50 - 1.10 (mg/dL)   Calcium 9.6  8.4 - 14.7 (mg/dL)   Total Protein 7.3  6.0 - 8.3 (g/dL)  Albumin 4.0  3.5 - 5.2 (g/dL)   AST 17  0 - 37 (U/L)   ALT 11  0 - 35 (U/L)   Alkaline Phosphatase 54  39 - 117 (U/L)   Total Bilirubin 0.8  0.3 - 1.2 (mg/dL)   GFR calc non Af Amer >90  >90 (mL/min)   GFR calc Af Amer >90  >90 (mL/min)  URINALYSIS, ROUTINE W REFLEX MICROSCOPIC      Component Value Range   Color, Urine YELLOW  YELLOW    APPearance CLEAR  CLEAR    Specific Gravity, Urine 1.033 (*) 1.005 - 1.030    pH 5.5  5.0 - 8.0    Glucose, UA NEGATIVE  NEGATIVE (mg/dL)   Hgb urine dipstick NEGATIVE  NEGATIVE    Bilirubin Urine NEGATIVE  NEGATIVE    Ketones, ur TRACE (*) NEGATIVE (mg/dL)   Protein, ur NEGATIVE  NEGATIVE (mg/dL)   Urobilinogen, UA 1.0  0.0 - 1.0 (mg/dL)    Nitrite NEGATIVE  NEGATIVE    Leukocytes, UA TRACE (*) NEGATIVE   PREGNANCY, URINE      Component Value Range   Preg Test, Ur NEGATIVE  NEGATIVE   URINE MICROSCOPIC-ADD ON      Component Value Range   Squamous Epithelial / LPF RARE  RARE    Bacteria, UA MANY (*) RARE    Urine-Other MUCOUS PRESENT      5:51 AM Psychiatric holding orders written. Discussed with Judeth Cornfield of [facility] to arrange for the patient to be evaluated.        Hanley Seamen, MD 09/27/11 0543  Hanley Seamen, MD 09/27/11 [phone]

## 2011-09-27 NOTE — ED Notes (Signed)
Per [employer] ,  Pt brought in voluntarily for suicidal ideation,  [employer] was called to residence for domestic issues.

## 2011-09-27 NOTE — Discharge Instructions (Signed)
Depression  Depression is a strong emotion of feeling unhappy that can last for weeks, months, or even longer. Depression causes problems with the ability to function in life. It upsets your:   Relationships.   Sleep.   Eating habits.   Work habits.  HOME CARE  Take all medicine as told by your doctor.   Talk with a therapist, counselor, or friend.   Eat a healthy diet.   Exercise regularly.   Do not drink alcohol or use drugs.  GET HELP RIGHT AWAY IF: You start to have thoughts about hurting yourself or others. MAKE SURE YOU:  Understand these instructions.   Will watch your condition.   Will get help right away if you are not doing well or get worse.  Document Released: 05/10/2010 Document Revised: 03/27/2011 Document Reviewed: 05/10/2010 [employer] Patient Information 2012 [employer]. 

## 2011-09-27 NOTE — ED Notes (Signed)
Pt states she is stressed out about multiple issues. Pt did not want to talk about the issues

## 2012-03-09 ENCOUNTER — Encounter (HOSPITAL_COMMUNITY): Payer: Self-pay | Admitting: Family

## 2012-03-09 ENCOUNTER — Inpatient Hospital Stay (HOSPITAL_COMMUNITY)
Admission: AD | Admit: 2012-03-09 | Discharge: 2012-03-09 | Disposition: A | Discharge status: Home / Self Care | Payer: Medicaid Other | Source: Ambulatory Visit | Attending: Obstetrics & Gynecology | Admitting: Obstetrics & Gynecology

## 2012-03-09 DIAGNOSIS — Z331 Pregnant state, incidental: Secondary | ICD-10-CM

## 2012-03-09 DIAGNOSIS — R300 Dysuria: Secondary | ICD-10-CM | POA: Insufficient documentation

## 2012-03-09 DIAGNOSIS — N390 Urinary tract infection, site not specified: Secondary | ICD-10-CM

## 2012-03-09 DIAGNOSIS — O2340 Unspecified infection of urinary tract in pregnancy, unspecified trimester: Secondary | ICD-10-CM

## 2012-03-09 DIAGNOSIS — O2390 Unspecified genitourinary tract infection in pregnancy, unspecified trimester: Secondary | ICD-10-CM

## 2012-03-09 LAB — URINALYSIS, ROUTINE W REFLEX MICROSCOPIC
Glucose, UA: 100 mg/dL — AB
Ketones, ur: NEGATIVE mg/dL
Nitrite: POSITIVE — AB
Specific Gravity, Urine: 1.025 (ref 1.005–1.030)
pH: 6 (ref 5.0–8.0)

## 2012-03-09 LAB — POCT PREGNANCY, URINE: Preg Test, Ur: POSITIVE — AB

## 2012-03-09 LAB — URINE MICROSCOPIC-ADD ON

## 2012-03-09 MED ORDER — CEPHALEXIN 500 MG PO CAPS: 500.0000 mg | ORAL_CAPSULE | Freq: Four times a day (QID) | ORAL | Status: DC

## 2012-03-09 NOTE — MAU Note (Addendum)
Pt reports history of UTIs and this is "worst one ever"'. Symptoms began 2 weeks ago. Takes Aleve and Tylenol for pain; She is bleeding with urination and reports 10/10 pain/pressure with urination.  Fever Fri and Sat; relieved by Tylenol.  LMP 10/24; reports normal period.

## 2012-03-09 NOTE — MAU Provider Note (Signed)
History     CSN: [id number]  Arrival date and time: 03/09/12 1535   First Provider Initiated Contact with Patient 03/09/12 1644      Chief Complaint  Patient presents with  . Dysuria   HPI Dysuria  The current episode started 1 to 4 weeks ago. The problem occurs every urination. The problem has been gradually worsening. The quality of the pain is described as aching. The pain is moderate. The maximum temperature recorded prior to her arrival was 101 - 101.9 F. The fever has been present for 1 - 2 days. There is no history of pyelonephritis. Associated symptoms include flank pain, frequency and hematuria. Pertinent negatives include no nausea or vomiting. She has tried acetaminophen and home medications for the symptoms. The treatment provided mild relief. Her past medical history is significant for recurrent UTIs.  LMP 02/12/12, normal expected time and normal flow of 5-6 days. Not using anything for contraception.   Past Medical History  Diagnosis Date  . No pertinent past medical history     Past Surgical History  Procedure Date  . Wisdom tooth extraction 2011    History reviewed. No pertinent family history.  History  Substance Use Topics  . Smoking status: Current Every Day Smoker -- 0.2 packs/day    Types: Cigarettes  . Smokeless tobacco: Not on file  . Alcohol Use: Yes     Comment: occasional social    Allergies: No Known Allergies  Prescriptions prior to admission  Medication Sig Dispense Refill  . mirtazapine (REMERON) 15 MG tablet Take 15 mg by mouth at bedtime as needed. For sleep      . naproxen sodium (ANAPROX) 220 MG tablet Take 660 mg by mouth daily as needed. For pain        Review of Systems  Constitutional: Positive for fever.  Gastrointestinal: Positive for abdominal pain. Negative for nausea and vomiting.  Genitourinary: Positive for frequency, hematuria and flank pain. Negative for dysuria.   Physical Exam   Blood pressure 127/73, pulse 86,  temperature 98.1 F (36.7 C), temperature source Oral, resp. rate 16, height 5\' 9"  (1.753 m), weight 88.996 kg (196 lb 3.2 oz), last menstrual period 02/12/2012, SpO2 100.00%.  Physical Exam  Constitutional: She is oriented to person, place, and time. She appears well-developed and well-nourished.  Cardiovascular: Normal rate, regular rhythm and normal heart sounds.   Respiratory: Effort normal and breath sounds normal.  GI: Soft. Bowel sounds are normal. She exhibits no distension and no mass. There is tenderness in the suprapubic area. There is CVA tenderness. There is no rebound and no guarding.  Neurological: She is alert and oriented to person, place, and time.  Skin: Skin is warm and dry.  Psychiatric: She has a normal mood and affect. Her behavior is normal. Judgment and thought content normal.   Results for orders placed during the hospital encounter of 03/09/12 (from the past 24 hour(s))  URINALYSIS, ROUTINE W REFLEX MICROSCOPIC     Status: Abnormal   Collection Time   03/09/12  3:40 PM      Component Value Range   Color, Urine YELLOW  YELLOW   APPearance CLEAR  CLEAR   Specific Gravity, Urine 1.025  1.005 - 1.030   pH 6.0  5.0 - 8.0   Glucose, UA 100 (*) NEGATIVE mg/dL   Hgb urine dipstick MODERATE (*) NEGATIVE   Bilirubin Urine NEGATIVE  NEGATIVE   Ketones, ur NEGATIVE  NEGATIVE mg/dL   Protein, ur NEGATIVE  NEGATIVE  mg/dL   Urobilinogen, UA 1.0  0.0 - 1.0 mg/dL   Nitrite POSITIVE (*) NEGATIVE   Leukocytes, UA MODERATE (*) NEGATIVE  URINE MICROSCOPIC-ADD ON     Status: Abnormal   Collection Time   03/09/12  3:40 PM      Component Value Range   Squamous Epithelial / LPF RARE  RARE   WBC, UA TOO NUMEROUS TO COUNT  <3 WBC/hpf   RBC / HPF 0-2  <3 RBC/hpf   Bacteria, UA MANY (*) RARE  POCT PREGNANCY, URINE     Status: Abnormal   Collection Time   03/09/12  4:21 PM      Component Value Range   Preg Test, Ur POSITIVE (*) NEGATIVE    MAU Course  Procedures  MDM Eve  Key NP consulted with Dr. Marice Potter. HPI reviewed. No w/u necessary for early pregnancy in view of no vaginal bleeding or abdominal pain. Patient surprised with positive UPT.  Assessment and Plan  A: UTI confirmed by UA     Positive urine pregnancy test, [redacted]w[redacted]d based on LMP   P: Treat with Keflex 500 mg qid x1 week. Return to MAU for fever over 101.5, continued bladder symptoms, or increased flank pain. Patient is surprised by positive pregnancy test but does admit she does not use contraceptive. Encouraged her to see her provide to discuss d/c of Remeron and to establish prenatal care.   Corky Downs 03/09/2012, 4:48 PM   I have reviewed the student's HPI, reviewed plan of care, I personally discussed patient's care and plan of care with Dr. Marice Potter.

## 2012-03-09 NOTE — MAU Note (Signed)
Patient states she has had pain with urination for about 2 weeks. Has tried Azo that helped for a short time. Has been getting worse and has blood in the urine now.

## 2012-03-11 LAB — URINE CULTURE

## 2012-03-15 ENCOUNTER — Inpatient Hospital Stay (HOSPITAL_COMMUNITY)
Admission: AD | Admit: 2012-03-15 | Discharge: 2012-03-15 | Disposition: A | Discharge status: Home / Self Care | Payer: Medicaid Other | Source: Ambulatory Visit | Attending: Obstetrics & Gynecology | Admitting: Obstetrics & Gynecology

## 2012-03-15 ENCOUNTER — Encounter (HOSPITAL_COMMUNITY): Payer: Self-pay

## 2012-03-15 ENCOUNTER — Inpatient Hospital Stay (HOSPITAL_COMMUNITY): Payer: Medicaid Other

## 2012-03-15 DIAGNOSIS — Z3490 Encounter for supervision of normal pregnancy, unspecified, unspecified trimester: Secondary | ICD-10-CM

## 2012-03-15 DIAGNOSIS — O99891 Other specified diseases and conditions complicating pregnancy: Secondary | ICD-10-CM | POA: Insufficient documentation

## 2012-03-15 DIAGNOSIS — R109 Unspecified abdominal pain: Secondary | ICD-10-CM

## 2012-03-15 LAB — CBC
Hemoglobin: 12 g/dL (ref 12.0–15.0)
MCHC: 33.1 g/dL (ref 30.0–36.0)
RBC: 4.05 MIL/uL (ref 3.87–5.11)
WBC: 8.4 10*3/uL (ref 4.0–10.5)

## 2012-03-15 LAB — URINALYSIS, ROUTINE W REFLEX MICROSCOPIC
Glucose, UA: NEGATIVE mg/dL
Ketones, ur: NEGATIVE mg/dL
Leukocytes, UA: NEGATIVE
Protein, ur: NEGATIVE mg/dL

## 2012-03-15 LAB — ABO/RH: ABO/RH(D): A POS

## 2012-03-15 LAB — HCG, QUANTITATIVE, PREGNANCY: hCG, Beta Chain, Quant, S: 2539 m[IU]/mL — ABNORMAL HIGH (ref <5)

## 2012-03-15 LAB — WET PREP, GENITAL
Trich, Wet Prep: NONE SEEN
Yeast Wet Prep HPF POC: NONE SEEN

## 2012-03-15 NOTE — MAU Provider Note (Signed)
Attestation of Attending Supervision of Advanced Practitioner (CNM/NP): Evaluation and management procedures were performed by the Advanced Practitioner under my supervision and collaboration.  I have reviewed the Advanced Practitioner's note and chart, and I agree with the management and plan.    , MD, FACOG Attending Obstetrician & Gynecologist Faculty Practice, [facility]  

## 2012-03-15 NOTE — Discharge Instructions (Signed)
Pregnancy - First Trimester During sexual intercourse, millions of sperm go into the vagina. Only 1 sperm will penetrate and fertilize the female egg while it is in the Fallopian tube. One week later, the fertilized egg implants into the wall of the uterus. An embryo begins to develop into a baby. At 6 to 8 weeks, the eyes and face are formed and the heartbeat can be seen on ultrasound. At the end of 12 weeks (first trimester), all the baby's organs are formed. Now that you are pregnant, you will want to do everything you can to have a healthy baby. Two of the most important things are to get good prenatal care and follow your caregiver's instructions. Prenatal care is all the medical care you receive before the baby's birth. It is given to prevent, find, and treat problems during the pregnancy and childbirth. PRENATAL EXAMS  During prenatal visits, your weight, blood pressure and urine are checked. This is done to make sure you are healthy and progressing normally during the pregnancy.  A pregnant woman should gain 25 to 35 pounds during the pregnancy. However, if you are over weight or underweight, your caregiver will advise you regarding your weight.  Your caregiver will ask and answer questions for you.  Blood work, cervical cultures, other necessary tests and a Pap test are done during your prenatal exams. These tests are done to check on your health and the probable health of your baby. Tests are strongly recommended and done for HIV with your permission. This is the virus that causes AIDS. These tests are done because medications can be given to help prevent your baby from being born with this infection should you have been infected without knowing it. Blood work is also used to find out your blood type, previous infections and follow your blood levels (hemoglobin).  Low hemoglobin (anemia) is common during pregnancy. Iron and vitamins are given to help prevent this. Later in the pregnancy, blood  tests for diabetes will be done along with any other tests if any problems develop. You may need tests to make sure you and the baby are doing well.  You may need other tests to make sure you and the baby are doing well. CHANGES DURING THE FIRST TRIMESTER (THE FIRST 3 MONTHS OF PREGNANCY) Your body goes through many changes during pregnancy. They vary from person to person. Talk to your caregiver about changes you notice and are concerned about. Changes can include:  Your menstrual period stops.  The egg and sperm carry the genes that determine what you look like. Genes from you and your partner are forming a baby. The female genes determine whether the baby is a boy or a girl.  Your body increases in girth and you may feel bloated.  Feeling sick to your stomach (nauseous) and throwing up (vomiting). If the vomiting is uncontrollable, call your caregiver.  Your breasts will begin to enlarge and become tender.  Your nipples may stick out more and become darker.  The need to urinate more. Painful urination may mean you have a bladder infection.  Tiring easily.  Loss of appetite.  Cravings for certain kinds of food.  At first, you may gain or lose a couple of pounds.  You may have changes in your emotions from day to day (excited to be pregnant or concerned something may go wrong with the pregnancy and baby).  You may have more vivid and strange dreams. HOME CARE INSTRUCTIONS   It is very important   to avoid all smoking, alcohol and un-prescribed drugs during your pregnancy. These affect the formation and growth of the baby. Avoid chemicals while pregnant to ensure the delivery of a healthy infant.  Start your prenatal visits by the 12th week of pregnancy. They are usually scheduled monthly at first, then more often in the last 2 months before delivery. Keep your caregiver's appointments. Follow your caregiver's instructions regarding medication use, blood and lab tests, exercise, and  diet.  During pregnancy, you are providing food for you and your baby. Eat regular, well-balanced meals. Choose foods such as meat, fish, milk and other low fat dairy products, vegetables, fruits, and whole-grain breads and cereals. Your caregiver will tell you of the ideal weight gain.  You can help morning sickness by keeping soda crackers at the bedside. Eat a couple before arising in the morning. You may want to use the crackers without salt on them.  Eating 4 to 5 small meals rather than 3 large meals a day also may help the nausea and vomiting.  Drinking liquids between meals instead of during meals also seems to help nausea and vomiting.  A physical sexual relationship may be continued throughout pregnancy if there are no other problems. Problems may be early (premature) leaking of amniotic fluid from the membranes, vaginal bleeding, or belly (abdominal) pain.  Exercise regularly if there are no restrictions. Check with your caregiver or physical therapist if you are unsure of the safety of some of your exercises. Greater weight gain will occur in the last 2 trimesters of pregnancy. Exercising will help:  Control your weight.  Keep you in shape.  Prepare you for labor and delivery.  Help you lose your pregnancy weight after you deliver your baby.  Wear a good support or jogging bra for breast tenderness during pregnancy. This may help if worn during sleep too.  Ask when prenatal classes are available. Begin classes when they are offered.  Do not use hot tubs, steam rooms or saunas.  Wear your seat belt when driving. This protects you and your baby if you are in an accident.  Avoid raw meat, uncooked cheese, cat litter boxes and soil used by cats throughout the pregnancy. These carry germs that can cause birth defects in the baby.  The first trimester is a good time to visit your dentist for your dental health. Getting your teeth cleaned is OK. Use a softer toothbrush and brush  gently during pregnancy.  Ask for help if you have financial, counseling or nutritional needs during pregnancy. Your caregiver will be able to offer counseling for these needs as well as refer you for other special needs.  Do not take any medications or herbs unless told by your caregiver.  Inform your caregiver if there is any mental or physical domestic violence.  Make a list of emergency phone numbers of family, friends, hospital, and police and fire departments.  Write down your questions. Take them to your prenatal visit.  Do not douche.  Do not cross your legs.  If you have to stand for long periods of time, rotate you feet or take small steps in a circle.  You may have more vaginal secretions that may require a sanitary pad. Do not use tampons or scented sanitary pads. MEDICATIONS AND DRUG USE IN PREGNANCY  Take prenatal vitamins as directed. The vitamin should contain 1 milligram of folic acid. Keep all vitamins out of reach of children. Only a couple vitamins or tablets containing iron may be   fatal to a baby or young child when ingested.  Avoid use of all medications, including herbs, over-the-counter medications, not prescribed or suggested by your caregiver. Only take over-the-counter or prescription medicines for pain, discomfort, or fever as directed by your caregiver. Do not use aspirin, ibuprofen, or naproxen unless directed by your caregiver.  Let your caregiver also know about herbs you may be using.  Alcohol is related to a number of birth defects. This includes fetal alcohol syndrome. All alcohol, in any form, should be avoided completely. Smoking will cause low birth rate and premature babies.  Street or illegal drugs are very harmful to the baby. They are absolutely forbidden. A baby born to an addicted mother will be addicted at birth. The baby will go through the same withdrawal an adult does.  Let your caregiver know about any medications that you have to take  and for what reason you take them. MISCARRIAGE IS COMMON DURING PREGNANCY A miscarriage does not mean you did something wrong. It is not a reason to worry about getting pregnant again. Your caregiver will help you with questions you may have. If you have a miscarriage, you may need minor surgery. SEEK MEDICAL CARE IF:  You have any concerns or worries during your pregnancy. It is better to call with your questions if you feel they cannot wait, rather than worry about them. SEEK IMMEDIATE MEDICAL CARE IF:   An unexplained oral temperature above 102 F (38.9 C) develops, or as your caregiver suggests.  You have leaking of fluid from the vagina (birth canal). If leaking membranes are suspected, take your temperature and inform your caregiver of this when you call.  There is vaginal spotting or bleeding. Notify your caregiver of the amount and how many pads are used.  You develop a bad smelling vaginal discharge with a change in the color.  You continue to feel sick to your stomach (nauseated) and have no relief from remedies suggested. You vomit blood or coffee ground-like materials.  You lose more than 2 pounds of weight in 1 week.  You gain more than 2 pounds of weight in 1 week and you notice swelling of your face, hands, feet, or legs.  You gain 5 pounds or more in 1 week (even if you do not have swelling of your hands, face, legs, or feet).  You get exposed to German measles and have never had them.  You are exposed to fifth disease or chickenpox.  You develop belly (abdominal) pain. Round ligament discomfort is a common non-cancerous (benign) cause of abdominal pain in pregnancy. Your caregiver still must evaluate this.  You develop headache, fever, diarrhea, pain with urination, or shortness of breath.  You fall or are in a car accident or have any kind of trauma.  There is mental or physical violence in your home. Document Released: 04/01/2001 Document Revised: 06/30/2011  Document Reviewed: 10/03/2008 [employer] Patient Information 2013 [employer].  

## 2012-03-15 NOTE — MAU Note (Signed)
Here last wk for UTI, found out was preg.  On Friday started having cramping, has been severe.

## 2012-03-15 NOTE — MAU Provider Note (Signed)
Chief Complaint: No chief complaint on file.   None    SUBJECTIVE HPI: Holly Brown is a 26 y.o. [phone] at [redacted]w[redacted]d by LMP who presents to maternity admissions reporting severe abdominal cramping, more on the right side x3 days, worsening today.  Patient's last menstrual period was 02/12/2012.  She denies vaginal bleeding, vaginal itching/burning, urinary symptoms, h/a, dizziness, n/v, or fever/chills.     Past Medical History  Diagnosis Date  . No pertinent past medical history    Past Surgical History  Procedure Date  . Wisdom tooth extraction 2011   History   Social History  . Marital Status: Single    Spouse Name: N/A    Number of Children: N/A  . Years of Education: N/A   Occupational History  . Not on file.   Social History Main Topics  . Smoking status: Former Smoker -- 0.2 packs/day    Types: Cigarettes  . Smokeless tobacco: Not on file  . Alcohol Use: Yes     Comment: occasional social  . Drug Use: No  . Sexually Active: Yes    Birth Control/ Protection: None   Other Topics Concern  . Not on file   Social History Narrative  . No narrative on file   No current facility-administered medications on file prior to encounter.   No current outpatient prescriptions on file prior to encounter.   No Known Allergies  ROS: Pertinent items in HPI  OBJECTIVE Blood pressure 129/66, pulse 96, temperature 98.3 F (36.8 C), temperature source Oral, resp. rate 18, height 5' 7.5" (1.715 m), weight 89.359 kg (197 lb), last menstrual period 02/12/2012. GENERAL: Well-developed, well-nourished female in no acute distress.  HEENT: Normocephalic HEART: normal rate RESP: normal effort ABDOMEN: Soft, non-tender Negative CVA tenderness EXTREMITIES: Nontender, no edema NEURO: Alert and oriented Pelvic exam: Cervix pink, visually closed, without lesion, scant white creamy discharge, vaginal walls and external genitalia normal Bimanual exam: Cervix 0/long/high, firm,  anterior, neg CMT, uterus nontender, slightly enlarged, adnexa with mild tenderness on right, none on left, no enlargement or mass bilaterally  LAB RESULTS Results for orders placed during the hospital encounter of 03/15/12 (from the past 24 hour(s))  HCG, QUANTITATIVE, PREGNANCY     Status: Abnormal   Collection Time   03/15/12 12:40 PM      Component Value Range   hCG, Beta Chain, Quant, S 2539 (*) <5 mIU/mL  ABO/RH     Status: Normal (Preliminary result)   Collection Time   03/15/12 12:40 PM      Component Value Range   ABO/RH(D) A POS    CBC     Status: Normal   Collection Time   03/15/12 12:40 PM      Component Value Range   WBC 8.4  4.0 - 10.5 K/uL   RBC 4.05  3.87 - 5.11 MIL/uL   Hemoglobin 12.0  12.0 - 15.0 g/dL   HCT 45.4  09.8 - 11.9 %   MCV 89.4  78.0 - 100.0 fL   MCH 29.6  26.0 - 34.0 pg   MCHC 33.1  30.0 - 36.0 g/dL   RDW 14.7  82.9 - 56.2 %   Platelets 366  150 - 400 K/uL    IMAGING  ASSESSMENT 1. Normal IUP (intrauterine pregnancy) on prenatal ultrasound     PLAN Discharge home F/U with early prenatal care Continue Keflex as prescribed at last MAU visit Pregnancy verification letter given Return to MAU as needed    Medication List  As of 03/15/2012  2:24 PM    ASK your doctor about these medications         cephALEXin 500 MG capsule   Commonly known as: KEFLEX   Take 500 mg by mouth 4 (four) times daily. 7 day course, not yet completed      multivitamin with minerals Tabs   Take 1 tablet by mouth daily.      naproxen sodium 220 MG tablet   Commonly known as: ANAPROX   Take 660 mg by mouth daily as needed. For pain         Sharen Counter Certified Nurse-Midwife 03/15/2012  2:24 PM

## 2012-04-02 ENCOUNTER — Inpatient Hospital Stay (HOSPITAL_COMMUNITY)
Admission: AD | Admit: 2012-04-02 | Discharge: 2012-04-02 | Disposition: A | Discharge status: Home / Self Care | Payer: Medicaid Other | Source: Ambulatory Visit | Attending: Obstetrics and Gynecology | Admitting: Obstetrics and Gynecology

## 2012-04-02 ENCOUNTER — Encounter (HOSPITAL_COMMUNITY): Payer: Self-pay | Admitting: *Deleted

## 2012-04-02 DIAGNOSIS — O219 Vomiting of pregnancy, unspecified: Secondary | ICD-10-CM

## 2012-04-02 DIAGNOSIS — O210 Mild hyperemesis gravidarum: Secondary | ICD-10-CM | POA: Insufficient documentation

## 2012-04-02 LAB — URINALYSIS, ROUTINE W REFLEX MICROSCOPIC
Glucose, UA: 250 mg/dL — AB
Hgb urine dipstick: NEGATIVE
Protein, ur: NEGATIVE mg/dL
Specific Gravity, Urine: >1.03 — ABNORMAL HIGH (ref 1.005–1.030)

## 2012-04-02 MED ORDER — ONDANSETRON HCL 4 MG PO TABS: 4.0000 mg | ORAL_TABLET | Freq: Three times a day (TID) | ORAL | Status: DC | PRN

## 2012-04-02 MED ORDER — PROMETHAZINE HCL 12.5 MG PO TABS: 12.5000 mg | ORAL_TABLET | Freq: Four times a day (QID) | ORAL | Status: DC | PRN

## 2012-04-02 MED ORDER — ONDANSETRON 8 MG PO TBDP
8.0000 mg | ORAL_TABLET | Freq: Once | ORAL | Status: AC
  Administered 2012-04-02: 8 mg via ORAL
  Filled 2012-04-02: qty 1

## 2012-04-02 NOTE — MAU Provider Note (Signed)
History     CSN: [id number]  Arrival date and time: 04/02/12 1154   None     Chief Complaint  Patient presents with  . Morning Sickness   HPI Holly Brown is 26 y.o. [phone] [redacted]w[redacted]d weeks presenting with persistent nausea vomiting.  Has not had vomiting with this pregnancy until yesterday.  Has vomited X 6 yesterday.  ? Fever since Wednesday.  Took ibuprofen Wed night.  Fever went down.  No fever today.  Weak and abdominal cramping. Had complete workup 11/25 to confirm IUP.  She was here for UTI at [redacted]wks gestation, completed medication.  Patient drank a ginger-ale on the way to MAU, states she threw some of it up.    Past Medical History  Diagnosis Date  . No pertinent past medical history     Past Surgical History  Procedure Date  . Wisdom tooth extraction 2011    Family History  Problem Relation Age of Onset  . Cancer Father   . Hypertension Maternal Grandmother   . Diabetes Maternal Grandmother   . Hypertension Paternal Grandmother     History  Substance Use Topics  . Smoking status: Former Smoker -- 0.2 packs/day    Types: Cigarettes  . Smokeless tobacco: Not on file  . Alcohol Use: Yes     Comment: occasional social    Allergies: No Known Allergies  Prescriptions prior to admission  Medication Sig Dispense Refill  . ibuprofen (ADVIL,MOTRIN) 200 MG tablet Take 400 mg by mouth once.      . Multiple Vitamin (MULTIVITAMIN WITH MINERALS) TABS Take 1 tablet by mouth daily.        Review of Systems  Constitutional: Negative for fever and chills.  Gastrointestinal: Positive for nausea, vomiting and abdominal pain (mild cramping). Negative for diarrhea and constipation.  Genitourinary:       Neg for vaginal bleeding or pelvic pain. Positive for breast tenderness  Neurological: Positive for weakness. Negative for headaches.   Physical Exam   Blood pressure 122/69, pulse 89, temperature 99 F (37.2 C), temperature source Oral, resp. rate 18, height 5' 7.75"  (1.721 m), weight 198 lb (89.812 kg), last menstrual period 02/12/2012.  Physical Exam  Constitutional: She is oriented to person, place, and time. She appears well-developed and well-nourished. No distress.  HENT:  Head: Normocephalic.  Neck: Normal range of motion.  Cardiovascular: Normal rate.   Respiratory: Effort normal. Right breast exhibits tenderness (mild). Right breast exhibits no inverted nipple, no mass, no nipple discharge and no skin change. Left breast exhibits tenderness (mild). Left breast exhibits no inverted nipple, no mass, no nipple discharge and no skin change. Breasts are symmetrical.  GI: Soft. She exhibits no distension and no mass. There is no tenderness. There is no rebound and no guarding.  Neurological: She is alert and oriented to person, place, and time.  Skin: Skin is warm and dry.  Psychiatric: She has a normal mood and affect. Her behavior is normal.   Results for orders placed during the hospital encounter of 04/02/12 (from the past 24 hour(s))  URINALYSIS, ROUTINE W REFLEX MICROSCOPIC     Status: Abnormal   Collection Time   04/02/12 12:00 PM      Component Value Range   Color, Urine YELLOW  YELLOW   APPearance CLEAR  CLEAR   Specific Gravity, Urine >1.030 (*) 1.005 - 1.030   pH 6.0  5.0 - 8.0   Glucose, UA 250 (*) NEGATIVE mg/dL   Hgb urine dipstick  NEGATIVE  NEGATIVE   Bilirubin Urine NEGATIVE  NEGATIVE   Ketones, ur NEGATIVE  NEGATIVE mg/dL   Protein, ur NEGATIVE  NEGATIVE mg/dL   Urobilinogen, UA 0.2  0.0 - 1.0 mg/dL   Nitrite NEGATIVE  NEGATIVE   Leukocytes, UA NEGATIVE  NEGATIVE   MAU Course  Procedures  MDM Zofran 8 ODT po in MAU 16:25  Patient is feeling less nauseated.  Trial of crackers and ginger ale.   16:40  Patient is able to keep apple juice down.  States the graham crackers did not taste good and she doesn't feel like they "settled" well.  Discussed plan of care with antiemetic.  Assessment and Plan  A:  Nausea and vomiting  at [redacted]w[redacted]d gestation  P:  Rx for Phenergan 12.5mg  tabs for night time use prn     Rx for Zofran for day time use     Begin prenatal care with doctor of her choice     Note for work  KEY,EVE M 04/02/2012, 3:11 PM

## 2012-04-02 NOTE — MAU Note (Signed)
Hasn't been able to keep anything down.  Cramping for the last couple wks.  Nauseated, weak.had fever off and on.  No sore throat.  only complaint now is nausea and weak.

## 2012-04-06 NOTE — MAU Provider Note (Signed)
Attestation of Attending Supervision of Advanced Practitioner (CNM/NP): Evaluation and management procedures were performed by the Advanced Practitioner under my supervision and collaboration.  I have reviewed the Advanced Practitioner's note and chart, and I agree with the management and plan.  , 04/06/2012 5:49 PM

## 2012-04-19 ENCOUNTER — Encounter (HOSPITAL_COMMUNITY): Payer: Self-pay

## 2012-04-19 ENCOUNTER — Inpatient Hospital Stay (HOSPITAL_COMMUNITY)
Admission: AD | Admit: 2012-04-19 | Discharge: 2012-04-19 | Disposition: A | Discharge status: Home / Self Care | Payer: Medicaid Other | Source: Ambulatory Visit | Attending: Obstetrics and Gynecology | Admitting: Obstetrics and Gynecology

## 2012-04-19 DIAGNOSIS — A499 Bacterial infection, unspecified: Secondary | ICD-10-CM

## 2012-04-19 DIAGNOSIS — B9689 Other specified bacterial agents as the cause of diseases classified elsewhere: Secondary | ICD-10-CM | POA: Insufficient documentation

## 2012-04-19 DIAGNOSIS — N760 Acute vaginitis: Secondary | ICD-10-CM | POA: Insufficient documentation

## 2012-04-19 DIAGNOSIS — R109 Unspecified abdominal pain: Secondary | ICD-10-CM | POA: Insufficient documentation

## 2012-04-19 DIAGNOSIS — O26899 Other specified pregnancy related conditions, unspecified trimester: Secondary | ICD-10-CM

## 2012-04-19 DIAGNOSIS — O2390 Unspecified genitourinary tract infection in pregnancy, unspecified trimester: Secondary | ICD-10-CM | POA: Insufficient documentation

## 2012-04-19 DIAGNOSIS — Z3490 Encounter for supervision of normal pregnancy, unspecified, unspecified trimester: Secondary | ICD-10-CM

## 2012-04-19 LAB — CBC WITH DIFFERENTIAL/PLATELET
HCT: 33.7 % — ABNORMAL LOW (ref 36.0–46.0)
Hemoglobin: 11.3 g/dL — ABNORMAL LOW (ref 12.0–15.0)
Lymphocytes Relative: 29 % (ref 12–46)
Monocytes Absolute: 0.7 10*3/uL (ref 0.1–1.0)
Monocytes Relative: 8 % (ref 3–12)
Neutro Abs: 5.9 10*3/uL (ref 1.7–7.7)
RBC: 3.74 MIL/uL — ABNORMAL LOW (ref 3.87–5.11)
WBC: 9.4 10*3/uL (ref 4.0–10.5)

## 2012-04-19 LAB — WET PREP, GENITAL
Trich, Wet Prep: NONE SEEN
Yeast Wet Prep HPF POC: NONE SEEN

## 2012-04-19 LAB — COMPREHENSIVE METABOLIC PANEL
AST: 14 U/L (ref 0–37)
Alkaline Phosphatase: 47 U/L (ref 39–117)
BUN: 9 mg/dL (ref 6–23)
CO2: 24 mEq/L (ref 19–32)
Chloride: 102 mEq/L (ref 96–112)
Creatinine, Ser: 0.64 mg/dL (ref 0.50–1.10)
GFR calc non Af Amer: >90 mL/min (ref >90)
Potassium: 4 mEq/L (ref 3.5–5.1)
Total Bilirubin: 0.4 mg/dL (ref 0.3–1.2)

## 2012-04-19 LAB — URINALYSIS, ROUTINE W REFLEX MICROSCOPIC
Glucose, UA: 500 mg/dL — AB
Leukocytes, UA: NEGATIVE
Specific Gravity, Urine: >1.03 — ABNORMAL HIGH (ref 1.005–1.030)
pH: 6 (ref 5.0–8.0)

## 2012-04-19 MED ORDER — METRONIDAZOLE 500 MG PO TABS: 500.0000 mg | ORAL_TABLET | Freq: Two times a day (BID) | ORAL | Status: DC

## 2012-04-19 NOTE — MAU Note (Signed)
Patient states she has been having period like cramping in the lower and back for about one week. Denies any bleeding or leaking. No vomiting or diarrhea.

## 2012-04-19 NOTE — MAU Provider Note (Signed)
History     CSN: [id number]  Arrival date and time: 04/19/12 1710   First Provider Initiated Contact with Patient 04/19/12 1812      Chief Complaint  Patient presents with  . Abdominal Cramping   HPI Holly Brown is 26 y.o. [phone] 107w4d weeks presenting with cramping, sharp pain that comes and goes.  Make her stop what she is doing.  Denies vaginal bleeding or abnormal discharge.  Has not begun prenatal care.  Rates pain as 10/10.  Nausea off and on, denies vomiting.  Taking Zofran for nausea that helps.  Denies UTI sxs.  Sometimes she wakes up at night shaking.  Denies diabetes.  Had 1 seizure in 2006 ? Cause.     Past Medical History  Diagnosis Date  . No pertinent past medical history     Past Surgical History  Procedure Date  . Wisdom tooth extraction 2011    Family History  Problem Relation Age of Onset  . Cancer Father   . Hypertension Maternal Grandmother   . Diabetes Maternal Grandmother   . Hypertension Paternal Grandmother     History  Substance Use Topics  . Smoking status: Former Smoker -- 0.2 packs/day    Types: Cigarettes  . Smokeless tobacco: Not on file  . Alcohol Use: Yes     Comment: occasional social    Allergies: No Known Allergies  Prescriptions prior to admission  Medication Sig Dispense Refill  . acetaminophen (TYLENOL) 325 MG tablet Take 650 mg by mouth every 6 (six) hours as needed. Stomach cramping      . ondansetron (ZOFRAN) 4 MG tablet Take 1 tablet (4 mg total) by mouth every 8 (eight) hours as needed for nausea.  20 tablet  0  . Prenatal Vit-Fe Fumarate-FA (PRENATAL MULTIVITAMIN) TABS Take 1 tablet by mouth daily.      . promethazine (PHENERGAN) 12.5 MG tablet Take 1 tablet (12.5 mg total) by mouth every 6 (six) hours as needed for nausea.  30 tablet  0  . [DISCONTINUED] Multiple Vitamin (MULTIVITAMIN WITH MINERALS) TABS Take 1 tablet by mouth daily.        Review of Systems  Constitutional: Negative.   Respiratory:  Negative.   Cardiovascular: Negative.   Gastrointestinal: Positive for nausea and abdominal pain. Negative for vomiting.  Genitourinary: Negative for dysuria and urgency.       Negative for vaginal bleeding or discharge   Physical Exam   Blood pressure 110/53, pulse 77, temperature 98.7 F (37.1 C), temperature source Oral, resp. rate 18, height 5\' 9"  (1.753 m), weight 199 lb (90.266 kg), last menstrual period 02/12/2012, SpO2 100.00%.  Physical Exam  Constitutional: She is oriented to person, place, and time. She appears well-developed and well-nourished. No distress.  HENT:  Head: Normocephalic.  Neck: Normal range of motion.  Cardiovascular: Normal rate.   Respiratory: Effort normal.  GI: Soft. She exhibits no distension and no mass. There is no tenderness. There is no rebound and no guarding.  Genitourinary: There is no tenderness or lesion on the right labia. There is no tenderness or lesion on the left labia. Uterus is enlarged. Uterus is not tender. Cervix exhibits no discharge and no friability. Right adnexum displays no mass, no tenderness and no fullness. Left adnexum displays no mass, no tenderness and no fullness. No bleeding around the vagina. Injury: small amount of white discharge without odor.  Neurological: She is alert and oriented to person, place, and time.  Skin: Skin is warm and  dry.  Psychiatric: She has a normal mood and affect. Her behavior is normal.   Results for orders placed during the hospital encounter of 04/19/12 (from the past 24 hour(s))  URINALYSIS, ROUTINE W REFLEX MICROSCOPIC     Status: Abnormal   Collection Time   04/19/12  5:55 PM      Component Value Range   Color, Urine YELLOW  YELLOW   APPearance CLEAR  CLEAR   Specific Gravity, Urine >1.030 (*) 1.005 - 1.030   pH 6.0  5.0 - 8.0   Glucose, UA 500 (*) NEGATIVE mg/dL   Hgb urine dipstick NEGATIVE  NEGATIVE   Bilirubin Urine NEGATIVE  NEGATIVE   Ketones, ur 15 (*) NEGATIVE mg/dL   Protein,  ur NEGATIVE  NEGATIVE mg/dL   Urobilinogen, UA 1.0  0.0 - 1.0 mg/dL   Nitrite NEGATIVE  NEGATIVE   Leukocytes, UA NEGATIVE  NEGATIVE  CBC WITH DIFFERENTIAL     Status: Abnormal   Collection Time   04/19/12  6:38 PM      Component Value Range   WBC 9.4  4.0 - 10.5 K/uL   RBC 3.74 (*) 3.87 - 5.11 MIL/uL   Hemoglobin 11.3 (*) 12.0 - 15.0 g/dL   HCT 11.9 (*) 14.7 - 82.9 %   MCV 90.1  78.0 - 100.0 fL   MCH 30.2  26.0 - 34.0 pg   MCHC 33.5  30.0 - 36.0 g/dL   RDW 56.2  13.0 - 86.5 %   Platelets 282  150 - 400 K/uL   Neutrophils Relative 62  43 - 77 %   Neutro Abs 5.9  1.7 - 7.7 K/uL   Lymphocytes Relative 29  12 - 46 %   Lymphs Abs 2.7  0.7 - 4.0 K/uL   Monocytes Relative 8  3 - 12 %   Monocytes Absolute 0.7  0.1 - 1.0 K/uL   Eosinophils Relative 1  0 - 5 %   Eosinophils Absolute 0.1  0.0 - 0.7 K/uL   Basophils Relative 0  0 - 1 %   Basophils Absolute 0.0  0.0 - 0.1 K/uL  COMPREHENSIVE METABOLIC PANEL     Status: Abnormal   Collection Time   04/19/12  6:38 PM      Component Value Range   Sodium 135  135 - 145 mEq/L   Potassium 4.0  3.5 - 5.1 mEq/L   Chloride 102  96 - 112 mEq/L   CO2 24  19 - 32 mEq/L   Glucose, Bld 88  70 - 99 mg/dL   BUN 9  6 - 23 mg/dL   Creatinine, Ser 7.84  0.50 - 1.10 mg/dL   Calcium 9.3  8.4 - 69.6 mg/dL   Total Protein 6.4  6.0 - 8.3 g/dL   Albumin 3.2 (*) 3.5 - 5.2 g/dL   AST 14  0 - 37 U/L   ALT 10  0 - 35 U/L   Alkaline Phosphatase 47  39 - 117 U/L   Total Bilirubin 0.4  0.3 - 1.2 mg/dL   GFR calc non Af Amer >90  >90 mL/min   GFR calc Af Amer >90  >90 mL/min  WET PREP, GENITAL     Status: Abnormal   Collection Time   04/19/12  7:08 PM      Component Value Range   Yeast Wet Prep HPF POC NONE SEEN  NONE SEEN   Trich, Wet Prep NONE SEEN  NONE SEEN   Clue Cells Wet Prep  HPF POC FEW (*) NONE SEEN   WBC, Wet Prep HPF POC MODERATE (*) NONE SEEN   MAU Course  Procedures  Bedside ultrasound + cardiac activity and movement  MDM GC/CHL culture  done 11/25 negative   Assessment and Plan  A:  Abdominal pain in early pregnancy     Viable pregnancy]     Bacterial vaginosis  P:  May take tylenol for cramping.      Begin prenatal care with doctor of your choice.     Rx for Flagyl to pharmacy  ,EVE M 04/19/2012, 6:34 PM

## 2012-04-21 NOTE — L&D Delivery Note (Signed)
Delivery Note At 1:25 PM a viable female was delivered via Vaginal, Spontaneous Delivery (Presentation: Left Occiput Anterior).  APGAR: 9, 9; weight .   Placenta status: Intact, Spontaneous.  Cord: 3 vessels with the following complications: None.  Cord pH: not done  Anesthesia: None  Episiotomy: None Lacerations: None Suture Repair: 2.0 Est. Blood Loss (mL): 250  Mom to postpartum.  Baby to nursery-stable.  , A 11/08/2012, 1:50 PM

## 2012-04-22 NOTE — MAU Provider Note (Signed)
Attestation of Attending Supervision of Advanced Practitioner (CNM/NP): Evaluation and management procedures were performed by the Advanced Practitioner under my supervision and collaboration.  I have reviewed the Advanced Practitioner's note and chart, and I agree with the management and plan.  , 04/22/2012 8:55 AM

## 2012-04-28 ENCOUNTER — Encounter (HOSPITAL_COMMUNITY): Payer: Self-pay

## 2012-05-14 ENCOUNTER — Encounter (HOSPITAL_COMMUNITY): Payer: Self-pay | Admitting: *Deleted

## 2012-05-14 ENCOUNTER — Inpatient Hospital Stay (HOSPITAL_COMMUNITY)
Admission: AD | Admit: 2012-05-14 | Discharge: 2012-05-14 | Disposition: A | Discharge status: Home / Self Care | Payer: Medicaid Other | Source: Ambulatory Visit | Attending: Obstetrics & Gynecology | Admitting: Obstetrics & Gynecology

## 2012-05-14 DIAGNOSIS — O209 Hemorrhage in early pregnancy, unspecified: Secondary | ICD-10-CM | POA: Insufficient documentation

## 2012-05-14 DIAGNOSIS — O99891 Other specified diseases and conditions complicating pregnancy: Secondary | ICD-10-CM | POA: Insufficient documentation

## 2012-05-14 DIAGNOSIS — J111 Influenza due to unidentified influenza virus with other respiratory manifestations: Secondary | ICD-10-CM | POA: Insufficient documentation

## 2012-05-14 DIAGNOSIS — A491 Streptococcal infection, unspecified site: Secondary | ICD-10-CM | POA: Insufficient documentation

## 2012-05-14 DIAGNOSIS — R509 Fever, unspecified: Secondary | ICD-10-CM | POA: Insufficient documentation

## 2012-05-14 LAB — RAPID STREP SCREEN (MED CTR MEBANE ONLY): Streptococcus, Group A Screen (Direct): NEGATIVE

## 2012-05-14 LAB — URINALYSIS, ROUTINE W REFLEX MICROSCOPIC
Glucose, UA: 500 mg/dL — AB
Hgb urine dipstick: NEGATIVE
Protein, ur: NEGATIVE mg/dL
pH: 5.5 (ref 5.0–8.0)

## 2012-05-14 LAB — INFLUENZA PANEL BY PCR (TYPE A & B)
H1N1 flu by pcr: NOT DETECTED
Influenza B By PCR: NEGATIVE

## 2012-05-14 MED ORDER — OSELTAMIVIR PHOSPHATE 75 MG PO CAPS: 75.0000 mg | ORAL_CAPSULE | Freq: Two times a day (BID) | ORAL | Status: DC

## 2012-05-14 NOTE — MAU Note (Signed)
Pt states she started running a fever with a sore throat on Tuesday. States some bright red vaginal bleeding when she wipes.

## 2012-05-14 NOTE — MAU Provider Note (Signed)
History     CSN: [id number]  Arrival date and time: 05/14/12 1058   First Provider Initiated Contact with Patient 05/14/12 1224      Chief Complaint  Patient presents with  . Fever  . Chills  . Vaginal Bleeding   HPI  Patient is a 27 y/o female that presents with spotting and flu-like symptoms since Tuesday.  The patient states that she has had flu contact.  She states that her spotting began yesterday.  She has had no previous hx of spotting.  She reports the spotting is when she wipes and there has been no clots.  She does not report any abdominal cramping.  The patient did not state that the bleeding was heavy.  The patients flu symptoms began on Tuesday.  She had a self reported temperature of 101.6 yesterday.  She complains of H/A, coughing, some SOB, and palpitations.  She has had the chills along with night sweats.  She denies N/V, diplopia, malaise, lightheadedness, diarrhea, dysuria, or rashes.  The patient is a G4P2 with SVD for both pregnancies.  She reports no prior complications with those pregnancies.    OB History    Grav Para Term Preterm Abortions TAB SAB Ect Mult Living   4 2 2  1  1   2       Past Medical History  Diagnosis Date  . No pertinent past medical history     Past Surgical History  Procedure Date  . Wisdom tooth extraction 2011    Family History  Problem Relation Age of Onset  . Cancer Father   . Hypertension Maternal Grandmother   . Diabetes Maternal Grandmother   . Hypertension Paternal Grandmother     History  Substance Use Topics  . Smoking status: Former Smoker -- 0.2 packs/day    Types: Cigarettes  . Smokeless tobacco: Not on file  . Alcohol Use: Yes     Comment: occasional social    Allergies: No Known Allergies  Prescriptions prior to admission  Medication Sig Dispense Refill  . acetaminophen (TYLENOL) 325 MG tablet Take 650 mg by mouth every 6 (six) hours as needed. Stomach cramping      . Prenatal Vit-Fe Fumarate-FA  (PRENATAL MULTIVITAMIN) TABS Take 1 tablet by mouth daily.        Review of Systems  Constitutional: Positive for fever, chills and malaise/fatigue.  HENT: Positive for congestion and sore throat.   Respiratory: Positive for cough.   Cardiovascular: Positive for palpitations.  Gastrointestinal: Positive for nausea. Negative for vomiting, abdominal pain and diarrhea.       Spotting   Genitourinary: Negative for dysuria.  Neurological: Positive for headaches.   Physical Exam   Blood pressure 114/65, pulse 87, temperature 99.1 F (37.3 C), temperature source Oral, resp. rate 18, last menstrual period 02/12/2012, SpO2 100.00%.  Physical Exam  Heart: RRR with not extra sounds, clicks, or murmurs Lungs: clear and equal breath sounds bilaterally, no adventitious sounds appreciated ABD: no rashes or masses noted.  No organomegaly.  Soft and non tender to palpation Throat: gums and gingiva intact.  No petechiae noted.  Posterior pharynx mildly erythematous with lymphadenopathy.  No exudates noted. Ears: external canal without erythema bilaterally. Right TM slightly bulging with signs of infection.  Left TM clear Neck: neck supple without lymphadenopathy.  Good ROM Pelvic: no lesions noted on the external genitalia.  Cervical os closed with no signs of active bleeding.  Bimanual: no uterine or adnexal tenderness noted  MAU Course  Procedures   Assessment and Plan  Influenza - flu swap Strep infection - rapid strep test Vaginal Bleeding - pelvic exam- cervix closed, no sign of active bleeding - Fetal Heart sounds appreciated  Plan: Discharge with Tamiflu  Evalee Mutton 05/14/2012, 12:41 PM

## 2012-05-16 NOTE — Progress Notes (Signed)
Patient called for flu test results from 05/14/12 encounter.  Negative test results given.

## 2012-06-09 ENCOUNTER — Other Ambulatory Visit (HOSPITAL_COMMUNITY): Payer: Self-pay | Admitting: Obstetrics

## 2012-06-09 DIAGNOSIS — N949 Unspecified condition associated with female genital organs and menstrual cycle: Secondary | ICD-10-CM

## 2012-06-10 ENCOUNTER — Ambulatory Visit (HOSPITAL_COMMUNITY)
Admission: RE | Admit: 2012-06-10 | Discharge: 2012-06-10 | Disposition: A | Discharge status: Home / Self Care | Payer: Medicaid Other | Source: Ambulatory Visit | Attending: Obstetrics | Admitting: Obstetrics

## 2012-06-10 DIAGNOSIS — Z3689 Encounter for other specified antenatal screening: Secondary | ICD-10-CM | POA: Insufficient documentation

## 2012-06-10 DIAGNOSIS — O09299 Supervision of pregnancy with other poor reproductive or obstetric history, unspecified trimester: Secondary | ICD-10-CM | POA: Insufficient documentation

## 2012-06-10 DIAGNOSIS — N949 Unspecified condition associated with female genital organs and menstrual cycle: Secondary | ICD-10-CM

## 2012-06-10 LAB — OB RESULTS CONSOLE RPR: RPR: NONREACTIVE

## 2012-06-10 LAB — OB RESULTS CONSOLE HEPATITIS B SURFACE ANTIGEN: Hepatitis B Surface Ag: NEGATIVE

## 2012-06-18 ENCOUNTER — Inpatient Hospital Stay (HOSPITAL_COMMUNITY)
Admission: AD | Admit: 2012-06-18 | Discharge: 2012-06-18 | Disposition: A | Discharge status: Home / Self Care | Payer: Medicaid Other | Source: Ambulatory Visit | Attending: Obstetrics | Admitting: Obstetrics

## 2012-06-18 ENCOUNTER — Encounter (HOSPITAL_COMMUNITY): Payer: Self-pay | Admitting: *Deleted

## 2012-06-18 DIAGNOSIS — A499 Bacterial infection, unspecified: Secondary | ICD-10-CM

## 2012-06-18 DIAGNOSIS — O2390 Unspecified genitourinary tract infection in pregnancy, unspecified trimester: Secondary | ICD-10-CM | POA: Insufficient documentation

## 2012-06-18 DIAGNOSIS — N760 Acute vaginitis: Secondary | ICD-10-CM

## 2012-06-18 DIAGNOSIS — O99891 Other specified diseases and conditions complicating pregnancy: Secondary | ICD-10-CM | POA: Insufficient documentation

## 2012-06-18 DIAGNOSIS — B9689 Other specified bacterial agents as the cause of diseases classified elsewhere: Secondary | ICD-10-CM | POA: Insufficient documentation

## 2012-06-18 LAB — URINALYSIS, ROUTINE W REFLEX MICROSCOPIC
Leukocytes, UA: NEGATIVE
Nitrite: NEGATIVE
Specific Gravity, Urine: >1.03 — ABNORMAL HIGH (ref 1.005–1.030)
Urobilinogen, UA: 2 mg/dL — ABNORMAL HIGH (ref 0.0–1.0)

## 2012-06-18 LAB — WET PREP, GENITAL
Trich, Wet Prep: NONE SEEN
Yeast Wet Prep HPF POC: NONE SEEN

## 2012-06-18 MED ORDER — METRONIDAZOLE 500 MG PO TABS: 500.0000 mg | ORAL_TABLET | Freq: Two times a day (BID) | ORAL | Status: DC

## 2012-06-18 NOTE — MAU Provider Note (Signed)
History     CSN: [id number]  Arrival date and time: 06/18/12 1813   First Provider Initiated Contact with Patient 06/18/12 1912      Chief Complaint  Patient presents with  . Vaginal Discharge   HPI Ms. ENOLA SIEBERS is a 27 y.o. [phone] at [redacted]w[redacted]d who presents to MAU today with complaint of LOF. The patient states that starting last night she noticed a thin, clear discharge in her underwear. She find it comes out more with coughing and sneezing and then leaks afterwards as well. She denies abnormal discharge previously, vaginal bleeding, abdominal pain or contractions. She does feel lower abdominal pressure, but states that this has occurred earlier in pregnancy as well. Dr. Gaynell Face has prescribed an abdominal binder to help with her symptoms. The pressure today feels the same as previously, but lasts longer when it is present. She does not have any history of PTL and no complications with this pregnancy.   OB History   Grav Para Term Preterm Abortions TAB SAB Ect Mult Living   4 2 2  1  1   2       Past Medical History  Diagnosis Date  . No pertinent past medical history   . Medical history non-contributory     Past Surgical History  Procedure Laterality Date  . Wisdom tooth extraction  2011  . Dilation and curettage of uterus      Family History  Problem Relation Age of Onset  . Cancer Father   . Hypertension Maternal Grandmother   . Diabetes Maternal Grandmother   . Hypertension Paternal Grandmother     History  Substance Use Topics  . Smoking status: Former Smoker -- 0.25 packs/day    Types: Cigarettes  . Smokeless tobacco: Not on file  . Alcohol Use: No    Allergies: No Known Allergies  Prescriptions prior to admission  Medication Sig Dispense Refill  . Prenatal Vit-Fe Fumarate-FA (PRENATAL MULTIVITAMIN) TABS Take 1 tablet by mouth daily.        Review of Systems  Constitutional: Negative for fever, chills and malaise/fatigue.  Gastrointestinal:  Positive for abdominal pain. Negative for nausea, vomiting, diarrhea and constipation.  Genitourinary: Negative for dysuria, urgency and frequency.       Neg - vaginal bleeding   Physical Exam   Blood pressure 117/65, pulse 81, temperature 98.2 F (36.8 C), temperature source Oral, resp. rate 16, height 5\' 8"  (1.727 m), weight 204 lb (92.534 kg), last menstrual period 02/12/2012.  Physical Exam  Constitutional: She is oriented to person, place, and time. She appears well-developed and well-nourished. No distress.  HENT:  Head: Normocephalic and atraumatic.  Cardiovascular: Normal rate.   Respiratory: Effort normal.  GI: Soft. She exhibits no distension and no mass. There is no tenderness. There is no rebound and no guarding.  Genitourinary: Vagina normal. Uterus is enlarged. Uterus is not tender. Cervix exhibits discharge (small amount of thin, white discharge noted at the cervical os and in the vagina). Cervix exhibits no motion tenderness and no friability. Right adnexum displays no mass and no tenderness. Left adnexum displays no mass and no tenderness.  Neurological: She is alert and oriented to person, place, and time.  Skin: Skin is warm and dry. No erythema.  Psychiatric: She has a normal mood and affect.  Cervix: Closed, long, thick and posterior  Results for orders placed during the hospital encounter of 06/18/12 (from the past 24 hour(s))  URINALYSIS, ROUTINE W REFLEX MICROSCOPIC  Status: Abnormal   Collection Time    06/18/12  6:49 PM      Result Value Range   Color, Urine YELLOW  YELLOW   APPearance CLEAR  CLEAR   Specific Gravity, Urine >1.030 (*) 1.005 - 1.030   pH 6.0  5.0 - 8.0   Glucose, UA 250 (*) NEGATIVE mg/dL   Hgb urine dipstick NEGATIVE  NEGATIVE   Bilirubin Urine NEGATIVE  NEGATIVE   Ketones, ur NEGATIVE  NEGATIVE mg/dL   Protein, ur NEGATIVE  NEGATIVE mg/dL   Urobilinogen, UA 2.0 (*) 0.0 - 1.0 mg/dL   Nitrite NEGATIVE  NEGATIVE   Leukocytes, UA  NEGATIVE  NEGATIVE  WET PREP, GENITAL     Status: Abnormal   Collection Time    06/18/12  7:25 PM      Result Value Range   Yeast Wet Prep HPF POC NONE SEEN  NONE SEEN   Trich, Wet Prep NONE SEEN  NONE SEEN   Clue Cells Wet Prep HPF POC FEW (*) NONE SEEN   WBC, Wet Prep HPF POC MANY (*) NONE SEEN     MAU Course  Procedures None  MDM Wet prep, GC/Chlamydia obtained GC/Chlamydia pending Neg - pooling Neg - ferning  Assessment and Plan  A: Bacterial Vaginosis  P: Discharge home Rx for Flagyl sent to patient's pharmacy Discussed hygiene products and probiotics to avoid recurrence Patient instructed to follow-up with Dr. Gaynell Face as scheduled Patient may return to MAU as needed or if her condition were to change or worsen   Freddi Starr, PA-C  06/18/2012, 8:03 PM

## 2012-06-18 NOTE — MAU Note (Signed)
First noted some leaking last night, clear and watery.  Little bit of leaking all day today.  Also feeling pressure

## 2012-07-06 ENCOUNTER — Other Ambulatory Visit (HOSPITAL_COMMUNITY): Payer: Self-pay | Admitting: Obstetrics

## 2012-07-06 DIAGNOSIS — Z3482 Encounter for supervision of other normal pregnancy, second trimester: Secondary | ICD-10-CM

## 2012-07-07 ENCOUNTER — Ambulatory Visit (HOSPITAL_COMMUNITY)
Admission: RE | Admit: 2012-07-07 | Discharge: 2012-07-07 | Disposition: A | Discharge status: Home / Self Care | Payer: Medicaid Other | Source: Ambulatory Visit | Attending: Obstetrics | Admitting: Obstetrics

## 2012-07-07 DIAGNOSIS — Z3482 Encounter for supervision of other normal pregnancy, second trimester: Secondary | ICD-10-CM

## 2012-07-07 DIAGNOSIS — Z3689 Encounter for other specified antenatal screening: Secondary | ICD-10-CM | POA: Insufficient documentation

## 2012-07-07 DIAGNOSIS — O4400 Placenta previa specified as without hemorrhage, unspecified trimester: Secondary | ICD-10-CM | POA: Insufficient documentation

## 2012-07-09 ENCOUNTER — Ambulatory Visit (HOSPITAL_COMMUNITY): Payer: Medicaid Other

## 2012-07-28 ENCOUNTER — Encounter (HOSPITAL_COMMUNITY): Payer: Self-pay | Admitting: *Deleted

## 2012-07-28 ENCOUNTER — Inpatient Hospital Stay (HOSPITAL_COMMUNITY)
Admission: AD | Admit: 2012-07-28 | Discharge: 2012-07-28 | Disposition: A | Discharge status: Home / Self Care | Payer: Medicaid Other | Source: Ambulatory Visit | Attending: Obstetrics | Admitting: Obstetrics

## 2012-07-28 DIAGNOSIS — R109 Unspecified abdominal pain: Secondary | ICD-10-CM | POA: Insufficient documentation

## 2012-07-28 DIAGNOSIS — M549 Dorsalgia, unspecified: Secondary | ICD-10-CM

## 2012-07-28 DIAGNOSIS — O99891 Other specified diseases and conditions complicating pregnancy: Secondary | ICD-10-CM | POA: Insufficient documentation

## 2012-07-28 DIAGNOSIS — O26892 Other specified pregnancy related conditions, second trimester: Secondary | ICD-10-CM

## 2012-07-28 DIAGNOSIS — N949 Unspecified condition associated with female genital organs and menstrual cycle: Secondary | ICD-10-CM

## 2012-07-28 DIAGNOSIS — M545 Low back pain, unspecified: Secondary | ICD-10-CM | POA: Insufficient documentation

## 2012-07-28 LAB — URINALYSIS, ROUTINE W REFLEX MICROSCOPIC
Bilirubin Urine: NEGATIVE
Hgb urine dipstick: NEGATIVE
Specific Gravity, Urine: >1.03 — ABNORMAL HIGH (ref 1.005–1.030)
pH: 6 (ref 5.0–8.0)

## 2012-07-28 MED ORDER — CYCLOBENZAPRINE HCL 10 MG PO TABS: 10.0000 mg | ORAL_TABLET | Freq: Two times a day (BID) | ORAL | Status: DC | PRN

## 2012-07-28 MED ORDER — ACETAMINOPHEN 500 MG PO TABS
1000.0000 mg | ORAL_TABLET | Freq: Once | ORAL | Status: AC
  Administered 2012-07-28: 1000 mg via ORAL
  Filled 2012-07-28: qty 2

## 2012-07-28 MED ORDER — ACETAMINOPHEN-CODEINE #3 300-30 MG PO TABS: 1.0000 | ORAL_TABLET | Freq: Four times a day (QID) | ORAL | Status: DC | PRN

## 2012-07-28 MED ORDER — CYCLOBENZAPRINE HCL 10 MG PO TABS
10.0000 mg | ORAL_TABLET | Freq: Once | ORAL | Status: AC
  Administered 2012-07-28: 10 mg via ORAL
  Filled 2012-07-28: qty 1

## 2012-07-28 NOTE — MAU Note (Signed)
States when the baby moves she feels it in her vagina.

## 2012-07-28 NOTE — MAU Provider Note (Signed)
History     CSN: [id number]  Arrival date and time: 07/28/12 1025   First Provider Initiated Contact with Patient 07/28/12 1138      Chief Complaint  Patient presents with  . Abdominal Pain  . Back Pain   HPI Ms. Holly Brown is a 27 y.o. [phone] at [redacted]w[redacted]d who presents to MAU today with pain in lower back and lower abdomen since last night. The patient states that she took tylenol last night and slept, but still had pain when she woke up this morning. She has not taken any medication today. She denies vaginal bleeding, abnormal discharge. She endorses occasional contractions and reports good fetal movement.   OB History   Grav Para Term Preterm Abortions TAB SAB Ect Mult Living   4 2 2  1  1   2       Past Medical History  Diagnosis Date  . No pertinent past medical history   . Medical history non-contributory     Past Surgical History  Procedure Laterality Date  . Wisdom tooth extraction  2011  . Dilation and curettage of uterus      Family History  Problem Relation Age of Onset  . Cancer Father   . Hypertension Maternal Grandmother   . Diabetes Maternal Grandmother   . Hypertension Paternal Grandmother     History  Substance Use Topics  . Smoking status: Former Smoker -- 0.25 packs/day    Types: Cigarettes  . Smokeless tobacco: Not on file  . Alcohol Use: No    Allergies: No Known Allergies  Prescriptions prior to admission  Medication Sig Dispense Refill  . metroNIDAZOLE (FLAGYL) 500 MG tablet Take 1 tablet (500 mg total) by mouth 2 (two) times daily.  14 tablet  0  . Prenatal Vit-Fe Fumarate-FA (PRENATAL MULTIVITAMIN) TABS Take 1 tablet by mouth daily.        Review of Systems  Constitutional: Negative for fever.  Gastrointestinal: Positive for abdominal pain. Negative for nausea, vomiting, diarrhea and constipation.  Genitourinary: Negative for dysuria, urgency and frequency.       Neg - vaginal bleeding, discharge  Musculoskeletal: Positive for  back pain.  Neurological: Negative for tingling and focal weakness.   Physical Exam   Blood pressure 114/49, pulse 88, temperature 98.3 F (36.8 C), temperature source Oral, resp. rate 16, height 5\' 8"  (1.727 m), weight 207 lb (93.895 kg), last menstrual period 02/12/2012, SpO2 100.00%.  Physical Exam  Constitutional: She is oriented to person, place, and time. She appears well-developed and well-nourished. No distress.  HENT:  Head: Normocephalic and atraumatic.  Cardiovascular: Normal rate, regular rhythm and normal heart sounds.   Respiratory: Effort normal and breath sounds normal. No respiratory distress.  GI: Soft. Bowel sounds are normal. She exhibits no distension and no mass. There is tenderness (mild tenderness to palpation of the lower abdomen and LUQ). There is no rebound and no guarding.  Musculoskeletal: She exhibits tenderness. She exhibits no edema.       Lumbar back: She exhibits tenderness and pain. She exhibits normal range of motion, no bony tenderness, no swelling, no edema, no deformity and no spasm.  Neurological: She is alert and oriented to person, place, and time.  Skin: Skin is warm and dry. No erythema.  Psychiatric: She has a normal mood and affect.   Results for orders placed during the hospital encounter of 07/28/12 (from the past 24 hour(s))  URINALYSIS, ROUTINE W REFLEX MICROSCOPIC     Status: Abnormal  Collection Time    07/28/12 10:45 AM      Result Value Range   Color, Urine YELLOW  YELLOW   APPearance CLEAR  CLEAR   Specific Gravity, Urine >1.030 (*) 1.005 - 1.030   pH 6.0  5.0 - 8.0   Glucose, UA 500 (*) NEGATIVE mg/dL   Hgb urine dipstick NEGATIVE  NEGATIVE   Bilirubin Urine NEGATIVE  NEGATIVE   Ketones, ur 15 (*) NEGATIVE mg/dL   Protein, ur NEGATIVE  NEGATIVE mg/dL   Urobilinogen, UA 0.2  0.0 - 1.0 mg/dL   Nitrite NEGATIVE  NEGATIVE   Leukocytes, UA NEGATIVE  NEGATIVE   Fetal monitoring: Baseline: 135 bpm, moderate variability, +  accelerations, no decelerations Contractions: none  MAU Course  Procedures None  MDM Discussed with Dr. Gaynell Face. Rx for Tylenol #3 and flexeril. Warm bath and hot water bottle to the back. Firm bed for back pain.   Assessment and Plan  A: Round ligament pain Back pain in pregnancy  P: Discharge home Rx for tylenol #3 and flexeril sent to patient's pharmacy Patient encouraged to use warm baths, hot water bottle for back pain and lower abdominal pain Patient encouraged to keep follow-up with Dr. Gaynell Face as scheduled Patient may return to MAU as needed or if her condition were to change or worsen  Freddi Starr, PA-C 07/28/2012, 11:40 AM

## 2012-07-28 NOTE — MAU Note (Signed)
Patient states she started having low back pain and abdominal pain since last night. Has had pain when walking for the entire pregnancy. Denies bleeding, leaking, nausea, vomiting or diarrhea. Reports feeling fetal movement.

## 2012-09-01 ENCOUNTER — Other Ambulatory Visit (HOSPITAL_COMMUNITY): Payer: Self-pay | Admitting: Obstetrics

## 2012-09-06 ENCOUNTER — Other Ambulatory Visit (HOSPITAL_COMMUNITY): Payer: Self-pay | Admitting: Obstetrics

## 2012-09-10 ENCOUNTER — Other Ambulatory Visit (HOSPITAL_COMMUNITY): Payer: Self-pay | Admitting: Obstetrics

## 2012-09-10 ENCOUNTER — Ambulatory Visit (HOSPITAL_COMMUNITY)
Admission: RE | Admit: 2012-09-10 | Discharge: 2012-09-10 | Disposition: A | Discharge status: Home / Self Care | Payer: Medicaid Other | Source: Ambulatory Visit | Attending: Obstetrics | Admitting: Obstetrics

## 2012-09-10 DIAGNOSIS — O26873 Cervical shortening, third trimester: Secondary | ICD-10-CM

## 2012-09-10 DIAGNOSIS — Z3689 Encounter for other specified antenatal screening: Secondary | ICD-10-CM | POA: Insufficient documentation

## 2012-09-10 DIAGNOSIS — O09299 Supervision of pregnancy with other poor reproductive or obstetric history, unspecified trimester: Secondary | ICD-10-CM | POA: Insufficient documentation

## 2012-09-10 DIAGNOSIS — O4400 Placenta previa specified as without hemorrhage, unspecified trimester: Secondary | ICD-10-CM | POA: Insufficient documentation

## 2012-09-15 ENCOUNTER — Other Ambulatory Visit (HOSPITAL_COMMUNITY): Payer: Medicaid Other

## 2012-09-23 ENCOUNTER — Ambulatory Visit (HOSPITAL_COMMUNITY): Admission: RE | Admit: 2012-09-23 | Payer: Medicaid Other | Source: Ambulatory Visit

## 2012-10-16 ENCOUNTER — Encounter (HOSPITAL_COMMUNITY): Payer: Self-pay | Admitting: *Deleted

## 2012-10-16 ENCOUNTER — Inpatient Hospital Stay (HOSPITAL_COMMUNITY)
Admission: AD | Admit: 2012-10-16 | Discharge: 2012-10-17 | Disposition: A | Discharge status: Home / Self Care | Payer: Medicaid Other | Source: Ambulatory Visit | Attending: Obstetrics | Admitting: Obstetrics

## 2012-10-16 DIAGNOSIS — O99891 Other specified diseases and conditions complicating pregnancy: Secondary | ICD-10-CM | POA: Insufficient documentation

## 2012-10-16 LAB — WET PREP, GENITAL
Trich, Wet Prep: NONE SEEN
Yeast Wet Prep HPF POC: NONE SEEN

## 2012-10-16 NOTE — MAU Note (Signed)
PT SAYS  SHE GOT OFF WORK AT 830-  AT HOME HER UNDERWEAR DRENCHED-  SAYS NOT URINE.  SAYS  NO PAD ON NOW AND UNDERWEAR NOT WET.     NO VE IN OFFICE. DENIES HSV AND MRSA.

## 2012-10-27 ENCOUNTER — Encounter (HOSPITAL_COMMUNITY): Payer: Self-pay

## 2012-10-27 ENCOUNTER — Inpatient Hospital Stay (HOSPITAL_COMMUNITY)
Admission: AD | Admit: 2012-10-27 | Discharge: 2012-10-27 | Disposition: A | Discharge status: Home / Self Care | Payer: Medicaid Other | Source: Ambulatory Visit | Attending: Obstetrics | Admitting: Obstetrics

## 2012-10-27 DIAGNOSIS — O479 False labor, unspecified: Secondary | ICD-10-CM | POA: Insufficient documentation

## 2012-10-27 NOTE — MAU Note (Signed)
contrctions

## 2012-10-27 NOTE — MAU Note (Signed)
Patient is in with c/o ctx q40m. She denies vaginal bleeding or lof. She reports good fetal movement. She reports good fetal movement.

## 2012-11-02 ENCOUNTER — Inpatient Hospital Stay (HOSPITAL_COMMUNITY)
Admission: AD | Admit: 2012-11-02 | Discharge: 2012-11-03 | Disposition: A | Discharge status: Home / Self Care | Payer: Medicaid Other | Source: Ambulatory Visit | Attending: Obstetrics | Admitting: Obstetrics

## 2012-11-02 ENCOUNTER — Encounter (HOSPITAL_COMMUNITY): Payer: Self-pay | Admitting: *Deleted

## 2012-11-02 DIAGNOSIS — O479 False labor, unspecified: Secondary | ICD-10-CM | POA: Insufficient documentation

## 2012-11-02 MED ORDER — ZOLPIDEM TARTRATE 5 MG PO TABS
5.0000 mg | ORAL_TABLET | Freq: Once | ORAL | Status: AC
  Administered 2012-11-03: 5 mg via ORAL
  Filled 2012-11-02: qty 1

## 2012-11-02 NOTE — MAU Note (Signed)
Pt states she started to having labor pains 2 hours ago,pt complaining of leg spasms that she has "always had"

## 2012-11-02 NOTE — MAU Note (Signed)
Pt presents with complaint of contractions.  

## 2012-11-07 ENCOUNTER — Inpatient Hospital Stay (HOSPITAL_COMMUNITY)
Admission: AD | Admit: 2012-11-07 | Discharge: 2012-11-10 | DRG: 775 | Disposition: A | Discharge status: Home / Self Care | Payer: Medicaid Other | Source: Ambulatory Visit | Attending: Obstetrics | Admitting: Obstetrics

## 2012-11-07 ENCOUNTER — Encounter (HOSPITAL_COMMUNITY): Payer: Self-pay | Admitting: *Deleted

## 2012-11-07 NOTE — MAU Note (Signed)
Contractions every 4-5 minutes x 2 hours. Denies leaking of fluid or vaginal bleeding. Positive fetal movement.

## 2012-11-08 ENCOUNTER — Encounter (HOSPITAL_COMMUNITY): Payer: Self-pay | Admitting: *Deleted

## 2012-11-08 LAB — RPR: RPR Ser Ql: NONREACTIVE

## 2012-11-08 LAB — CBC
Hemoglobin: 11 g/dL — ABNORMAL LOW (ref 12.0–15.0)
MCH: 30.1 pg (ref 26.0–34.0)
MCHC: 34 g/dL (ref 30.0–36.0)
Platelets: 240 10*3/uL (ref 150–400)
RDW: 13.1 % (ref 11.5–15.5)

## 2012-11-08 MED ORDER — BUTORPHANOL TARTRATE 1 MG/ML IJ SOLN
1.0000 mg | INTRAMUSCULAR | Status: DC | PRN
  Filled 2012-11-08 (×2): qty 1

## 2012-11-08 MED ORDER — OXYTOCIN 40 UNITS IN LACTATED RINGERS INFUSION - SIMPLE MED
62.5000 mL/h | INTRAVENOUS | Status: DC
  Filled 2012-11-08: qty 1000

## 2012-11-08 MED ORDER — DIPHENHYDRAMINE HCL 25 MG PO CAPS: 25.0000 mg | ORAL_CAPSULE | Freq: Four times a day (QID) | ORAL | Status: DC | PRN

## 2012-11-08 MED ORDER — FLEET ENEMA 7-19 GM/118ML RE ENEM: 1.0000 | ENEMA | RECTAL | Status: DC | PRN

## 2012-11-08 MED ORDER — OXYTOCIN 40 UNITS IN LACTATED RINGERS INFUSION - SIMPLE MED
1.0000 m[IU]/min | INTRAVENOUS | Status: DC
  Administered 2012-11-08: 2 m[IU]/min via INTRAVENOUS

## 2012-11-08 MED ORDER — LACTATED RINGERS IV SOLN
INTRAVENOUS | Status: DC
  Administered 2012-11-08: 02:00:00 via INTRAVENOUS

## 2012-11-08 MED ORDER — LACTATED RINGERS IV SOLN: 500.0000 mL | INTRAVENOUS | Status: DC | PRN

## 2012-11-08 MED ORDER — FERROUS SULFATE 325 (65 FE) MG PO TABS
325.0000 mg | ORAL_TABLET | Freq: Two times a day (BID) | ORAL | Status: DC
  Administered 2012-11-09 – 2012-11-10 (×3): 325 mg via ORAL
  Filled 2012-11-08 (×3): qty 1

## 2012-11-08 MED ORDER — ONDANSETRON HCL 4 MG/2ML IJ SOLN: 4.0000 mg | Freq: Four times a day (QID) | INTRAMUSCULAR | Status: DC | PRN

## 2012-11-08 MED ORDER — LIDOCAINE HCL (PF) 1 % IJ SOLN
30.0000 mL | INTRAMUSCULAR | Status: DC | PRN
  Filled 2012-11-08: qty 30

## 2012-11-08 MED ORDER — OXYCODONE-ACETAMINOPHEN 5-325 MG PO TABS: 1.0000 | ORAL_TABLET | ORAL | Status: DC | PRN

## 2012-11-08 MED ORDER — OXYTOCIN BOLUS FROM INFUSION: 500.0000 mL | INTRAVENOUS | Status: DC

## 2012-11-08 MED ORDER — BUTORPHANOL TARTRATE 1 MG/ML IJ SOLN
1.0000 mg | INTRAMUSCULAR | Status: DC | PRN
  Administered 2012-11-08 (×2): 1 mg via INTRAVENOUS

## 2012-11-08 MED ORDER — ACETAMINOPHEN 325 MG PO TABS: 650.0000 mg | ORAL_TABLET | ORAL | Status: DC | PRN

## 2012-11-08 MED ORDER — DIBUCAINE 1 % RE OINT: 1.0000 "application " | TOPICAL_OINTMENT | RECTAL | Status: DC | PRN

## 2012-11-08 MED ORDER — TETANUS-DIPHTH-ACELL PERTUSSIS 5-2.5-18.5 LF-MCG/0.5 IM SUSP: 0.5000 mL | Freq: Once | INTRAMUSCULAR | Status: DC

## 2012-11-08 MED ORDER — OXYCODONE-ACETAMINOPHEN 5-325 MG PO TABS
1.0000 | ORAL_TABLET | ORAL | Status: DC | PRN
  Administered 2012-11-09 (×2): 1 via ORAL
  Filled 2012-11-08 (×2): qty 1

## 2012-11-08 MED ORDER — IBUPROFEN 600 MG PO TABS
600.0000 mg | ORAL_TABLET | Freq: Four times a day (QID) | ORAL | Status: DC | PRN
  Filled 2012-11-08 (×6): qty 1

## 2012-11-08 MED ORDER — ONDANSETRON HCL 4 MG PO TABS: 4.0000 mg | ORAL_TABLET | ORAL | Status: DC | PRN

## 2012-11-08 MED ORDER — IBUPROFEN 600 MG PO TABS: 600.0000 mg | ORAL_TABLET | Freq: Four times a day (QID) | ORAL | Status: DC | PRN

## 2012-11-08 MED ORDER — WITCH HAZEL-GLYCERIN EX PADS: 1.0000 "application " | MEDICATED_PAD | CUTANEOUS | Status: DC | PRN

## 2012-11-08 MED ORDER — OXYTOCIN 40 UNITS IN LACTATED RINGERS INFUSION - SIMPLE MED
62.5000 mL/h | INTRAVENOUS | Status: DC
  Administered 2012-11-08: 62.5 mL/h via INTRAVENOUS

## 2012-11-08 MED ORDER — CITRIC ACID-SODIUM CITRATE 334-500 MG/5ML PO SOLN: 30.0000 mL | ORAL | Status: DC | PRN

## 2012-11-08 MED ORDER — LACTATED RINGERS IV SOLN: INTRAVENOUS | Status: DC

## 2012-11-08 MED ORDER — LANOLIN HYDROUS EX OINT: TOPICAL_OINTMENT | CUTANEOUS | Status: DC | PRN

## 2012-11-08 MED ORDER — IBUPROFEN 600 MG PO TABS
600.0000 mg | ORAL_TABLET | Freq: Four times a day (QID) | ORAL | Status: DC
  Administered 2012-11-08 – 2012-11-09 (×3): 600 mg via ORAL

## 2012-11-08 MED ORDER — SENNOSIDES-DOCUSATE SODIUM 8.6-50 MG PO TABS: 2.0000 | ORAL_TABLET | Freq: Every day | ORAL | Status: DC

## 2012-11-08 MED ORDER — ONDANSETRON HCL 4 MG/2ML IJ SOLN: 4.0000 mg | INTRAMUSCULAR | Status: DC | PRN

## 2012-11-08 MED ORDER — TERBUTALINE SULFATE 1 MG/ML IJ SOLN: 0.2500 mg | Freq: Once | INTRAMUSCULAR | Status: DC | PRN

## 2012-11-08 MED ORDER — BENZOCAINE-MENTHOL 20-0.5 % EX AERO: 1.0000 "application " | INHALATION_SPRAY | CUTANEOUS | Status: DC | PRN

## 2012-11-08 MED ORDER — PRENATAL MULTIVITAMIN CH: 1.0000 | ORAL_TABLET | Freq: Every day | ORAL | Status: DC

## 2012-11-08 MED ORDER — SIMETHICONE 80 MG PO CHEW: 80.0000 mg | CHEWABLE_TABLET | ORAL | Status: DC | PRN

## 2012-11-08 MED ORDER — ZOLPIDEM TARTRATE 5 MG PO TABS: 5.0000 mg | ORAL_TABLET | Freq: Every evening | ORAL | Status: DC | PRN

## 2012-11-08 NOTE — H&P (Signed)
This is Dr. Francoise Ceo dictating the history and physical on  Holly Brown she's a 27 year old gravida 4 para 2012 at 39 weeks and 4 days [facility] 11/18/2012 admitted in labor 4 cm last night and and this morning at 6:30 she was 4 cm 80% vertex -3 contracting mildly so she was started on Pitocin stimulation she progressed rapidly had a normal vaginal delivery by the nurse Apgar 9 and 22 female placenta spontaneous there were no lacerations shows a negative GBS Past medical history negative Past surgical history negative Social history negative Physical exam well-developed female in labor HEENT negative Rest negative Heart regular rhythm no murmurs no gallops Abdomen 20 week postpartum size uterus Pelvic negative

## 2012-11-08 NOTE — Progress Notes (Signed)
Holly Brown is a 27 y.o. [phone] at [redacted]w[redacted]d by LMP admitted for active labor  Subjective:   Objective: BP 124/71  Pulse 85  Temp(Src) 98.2 F (36.8 C) (Oral)  Resp 18  Ht 5\' 10"  (1.778 m)  Wt 223 lb (101.152 kg)  BMI 32 kg/m2  LMP 02/12/2012      FHT:  FHR: 150 bpm, variability: moderate,  accelerations:  Present,  decelerations:  Absent UC:   regular, every 3-5 minutes SVE:   Dilation: 5 Effacement (%): 80 Station: -3 Exam by:: L. Munford RN  Labs: Lab Results  Component Value Date   WBC 8.5 11/08/2012   HGB 11.0* 11/08/2012   HCT 32.4* 11/08/2012   MCV 88.8 11/08/2012   PLT 240 11/08/2012    Assessment / Plan: Spontaneous labor, progressing normally  Labor: Progressing normally Preeclampsia:  n/a Fetal Wellbeing:  Category I Pain Control:  Labor support without medications I/D:  n/a Anticipated MOD:  NSVD  , A 11/08/2012, 2:59 AM

## 2012-11-08 NOTE — H&P (Signed)
Holly Brown is a 27 y.o. female presenting for UC's. Maternal Medical History:  Reason for admission: Contractions.  26 y o G4 P2.  EDC 11-18-12.  Presents with UC's.  Fetal activity: Perceived fetal activity is normal.   Last perceived fetal movement was within the past hour.    Prenatal Complications - Diabetes: none.    OB History   Grav Para Term Preterm Abortions TAB SAB Ect Mult Living   4 2 2  1  1   2      Past Medical History  Diagnosis Date  . No pertinent past medical history   . Medical history non-contributory    Past Surgical History  Procedure Laterality Date  . Wisdom tooth extraction  2011  . Dilation and curettage of uterus     Family History: family history includes Cancer in her father; Diabetes in her maternal grandmother; and Hypertension in her maternal grandmother and paternal grandmother. Social History:  reports that she has quit smoking. Her smoking use included Cigarettes. She smoked 0.25 packs per day. She does not have any smokeless tobacco history on file. She reports that she does not drink alcohol or use illicit drugs.   Prenatal Transfer Tool  Maternal Diabetes: No Genetic Screening: Normal Maternal Ultrasounds/Referrals: Normal Fetal Ultrasounds or other Referrals:  None Maternal Substance Abuse:  No Significant Maternal Medications:  None Significant Maternal Lab Results:  None Other Comments:  None  Review of Systems  All other systems reviewed and are negative.    Dilation: 5 Effacement (%): 80 Station: -3 Exam by:: L. Munford RN Blood pressure 124/71, pulse 85, temperature 97.9 F (36.6 C), temperature source Oral, resp. rate 20, height 5\' 10"  (1.778 m), weight 223 lb 3.2 oz (101.243 kg), last menstrual period 02/12/2012. Maternal Exam:  Uterine Assessment: Contraction frequency is regular.   Abdomen: Patient reports no abdominal tenderness. Fetal presentation: vertex  Pelvis: adequate for delivery.   Cervix: Cervix  evaluated by digital exam.     Physical Exam  Nursing note and vitals reviewed. Constitutional: She appears well-developed and well-nourished.  Cardiovascular: Normal rate and regular rhythm.   Respiratory: Effort normal and breath sounds normal.  GI: Soft.    Prenatal labs: ABO, Rh: --/--/A POS (11/25 1240) Antibody: Negative (02/20 0000) Rubella: Immune (02/20 0000) RPR: Nonreactive (02/20 0000)  HBsAg: Negative (02/20 0000)  HIV: Non-reactive (02/20 0000)  GBS:     Assessment/Plan: 38 weeks.  Active labor.  Admit.   , A 11/08/2012, 1:52 AM

## 2012-11-09 LAB — CBC
Hemoglobin: 9.7 g/dL — ABNORMAL LOW (ref 12.0–15.0)
MCH: 29.8 pg (ref 26.0–34.0)
Platelets: 236 10*3/uL (ref 150–400)
RBC: 3.25 MIL/uL — ABNORMAL LOW (ref 3.87–5.11)
WBC: 10.5 10*3/uL (ref 4.0–10.5)

## 2012-11-09 MED ORDER — COMPLETENATE 29-1 MG PO CHEW
1.0000 | CHEWABLE_TABLET | Freq: Every day | ORAL | Status: DC
  Filled 2012-11-09 (×2): qty 1

## 2012-11-09 MED ORDER — IBUPROFEN 100 MG/5ML PO SUSP
600.0000 mg | Freq: Four times a day (QID) | ORAL | Status: DC
  Administered 2012-11-10: 600 mg via ORAL
  Filled 2012-11-09 (×2): qty 30

## 2012-11-09 NOTE — Progress Notes (Signed)
Patient ID: Holly Brown, female   DOB: 05-09-1985, 27 y.o.   MRN: [id number] Pp 1 Vs nl Fundus firm Legs neg Doing well

## 2012-11-09 NOTE — Progress Notes (Signed)
UR chart review completed.  

## 2012-11-10 NOTE — Discharge Summary (Signed)
Obstetric Discharge Summary Reason for Admission: onset of labor Prenatal Procedures: none Intrapartum Procedures: spontaneous vaginal delivery Postpartum Procedures: none Complications-Operative and Postpartum: none Hemoglobin  Date Value Range Status  11/09/2012 9.7* 12.0 - 15.0 g/dL Final     HCT  Date Value Range Status  11/09/2012 28.8* 36.0 - 46.0 % Final    Physical Exam:  General: alert Lochia: appropriate Uterine Fundus: firm Incision: healing well DVT Evaluation: No evidence of DVT seen on physical exam.  Discharge Diagnoses: Term Pregnancy-delivered  Discharge Information: Date: 11/10/2012 Activity: pelvic rest Diet: routine Medications: None Condition: stable Instructions: refer to practice specific booklet Discharge to: home Follow-up Information   Schedule an appointment as soon as possible for a visit with Kathreen Cosier, MD.   Contact information:   [address] [place] [address] [phone]       Newborn Data: Live born female  Birth Weight: 7 lb 12.6 oz (3532 g) APGAR: 9, 9  Home with mother.  , A 11/10/2012, 6:47 AM

## 2013-01-18 ENCOUNTER — Ambulatory Visit (INDEPENDENT_AMBULATORY_CARE_PROVIDER_SITE_OTHER): Payer: Medicaid Other | Admitting: Advanced Practice Midwife

## 2013-01-18 ENCOUNTER — Encounter: Payer: Self-pay | Admitting: Advanced Practice Midwife

## 2013-01-18 ENCOUNTER — Ambulatory Visit: Payer: Self-pay | Admitting: Advanced Practice Midwife

## 2013-01-18 VITALS — BP 114/75 | HR 87 | Temp 98.8°F | Ht 69.0 in | Wt 187.0 lb

## 2013-01-18 DIAGNOSIS — Z3202 Encounter for pregnancy test, result negative: Secondary | ICD-10-CM

## 2013-01-18 DIAGNOSIS — IMO0001 Reserved for inherently not codable concepts without codable children: Secondary | ICD-10-CM

## 2013-01-18 DIAGNOSIS — Z30430 Encounter for insertion of intrauterine contraceptive device: Secondary | ICD-10-CM | POA: Insufficient documentation

## 2013-01-18 DIAGNOSIS — Z309 Encounter for contraceptive management, unspecified: Secondary | ICD-10-CM | POA: Insufficient documentation

## 2013-01-18 LAB — POCT URINE PREGNANCY: Preg Test, Ur: NEGATIVE

## 2013-01-18 NOTE — Progress Notes (Signed)
IUD Procedure Note   DIAGNOSIS: Desires long-term, reversible contraception   PROCEDURE: IUD placement Performing Provider: Dory Horn CNM  Patient counseled prior to procedure. I explained risks and benefits of Mirena IUD, reviewed alternative forms of contraception. Patient stated understanding and consented to continue with procedure.   LMP: 12/17/12 Pregnancy Test: Negative Lot #: [id number] Expiration Date: 8/16   IUD type: [x]  Mirena    PROCEDURE:  Timeout procedure was performed to ensure right patient and right site.  A bimanual exam was performed to determine the position of the uterus, midline. The speculum was placed. The vagina and cervix was sterilized in the usual manner and sterile technique was maintained throughout the course of the procedure. A single toothed tenaculum was applied to the posterior lip of the cervix and gentle traction applied. The depth of the uterus was sounded to 7.5 cm. With gentle traction on the tenaculum, the IUD was inserted to the appropriate depth and inserted without difficulty.  The string was cut to an estimated 4 cm length. Bleeding was minimal. The patient tolerated the procedure well.   Follow up: The patient tolerated the procedure well without complications.  Standard post-procedure care is explained and return precautions are given.   Wilson Singer CNM

## 2013-01-18 NOTE — Progress Notes (Signed)
Subjective:     Holly Brown is a 27 y.o. female here for a birth control consult. No current complaints.  Patient is interested in the [place].  She delivered a baby boy on 11/08/12 and had unprotected intercourse once about four weeks ago.   Personal health questionnaire reviewed: yes.   Gynecologic History Patient's last menstrual period was 12/17/2012. Contraception: none Last Pap: 05/05/2012. Results were: normal Last mammogram: N/A  Patient had intercourse 5 weeks ago, negative UPT today. Denies risk of STI.  Obstetric History OB History  Gravida Para Term Preterm AB SAB TAB Ectopic Multiple Living  4 3 3  1 1    3     # Outcome Date GA Lbr Len/2nd Weight Sex Delivery Anes PTL Lv  4 TRM 11/08/12 [redacted]w[redacted]d  7 lb 12.6 oz (3.532 kg) M SVD None  Y  3 TRM     M SVD   Y  2 TRM     F SVD   Y  1 SAB                The following portions of the patient's history were reviewed and updated as appropriate: allergies, current medications, past family history, past medical history, past social history, past surgical history and problem list.  Review of Systems A comprehensive review of systems was negative.    Objective:    BP 114/75  Pulse 87  Temp(Src) 98.8 F (37.1 C) (Oral)  Ht 5\' 9"  (1.753 m)  Wt 187 lb (84.823 kg)  BMI 27.6 kg/m2  LMP 12/17/2012  Breastfeeding? No General appearance: alert and cooperative Pelvic: cervix normal in appearance, external genitalia normal, no adnexal masses or tenderness, no cervical motion tenderness, rectovaginal septum normal, uterus normal size, shape, and consistency and vagina normal without discharge    Assessment:    Healthy female exam.  Canidate for the mirena IUD Mirena IUD insert today w/ out complication   Plan:    Education reviewed: safe sex/STD prevention and Contraception counseling. Contraception: IUD.   See procedure note.   50 min spent with patient greater than 80% spent in counseling and coordination of care.     Wilson Singer CNM

## 2013-01-19 LAB — GC/CHLAMYDIA PROBE AMP
CT Probe RNA: NEGATIVE
GC Probe RNA: NEGATIVE

## 2013-01-28 ENCOUNTER — Ambulatory Visit: Payer: Self-pay | Admitting: Advanced Practice Midwife

## 2013-02-24 ENCOUNTER — Other Ambulatory Visit: Payer: Self-pay

## 2013-03-07 ENCOUNTER — Ambulatory Visit (INDEPENDENT_AMBULATORY_CARE_PROVIDER_SITE_OTHER): Payer: Medicaid Other | Admitting: Obstetrics

## 2013-03-07 ENCOUNTER — Encounter: Payer: Self-pay | Admitting: Obstetrics

## 2013-03-07 VITALS — BP 123/77 | HR 93 | Temp 98.5°F

## 2013-03-07 DIAGNOSIS — Z30431 Encounter for routine checking of intrauterine contraceptive device: Secondary | ICD-10-CM

## 2013-03-07 NOTE — Progress Notes (Signed)
Subjective:     Holly Brown is a 27 y.o. female here for a ro`utine exam.  Current complaints: Patient is in the office today for follow up to insertion of IUD. Patient reports she has had extended bleeding after insertion that has subsided. Patient reports she can feel her string.  Personal health questionnaire reviewed: yes.   Gynecologic History Patient's last menstrual period was 01/19/2013. Contraception: IUD Last Pap: 05/2012. Results were: normal   Obstetric History OB History  Gravida Para Term Preterm AB SAB TAB Ectopic Multiple Living  4 3 3  1 1    3     # Outcome Date GA Lbr Len/2nd Weight Sex Delivery Anes PTL Lv  4 TRM 11/08/12 [redacted]w[redacted]d  7 lb 12.6 oz (3.532 kg) M SVD None  Y  3 TRM     M SVD   Y  2 TRM     F SVD   Y  1 SAB                The following portions of the patient's history were reviewed and updated as appropriate: allergies, current medications, past family history, past medical history, past social history, past surgical history and problem list.  Review of Systems Pertinent items are noted in HPI.    Objective:    General appearance: alert and no distress    Abdomen:  Soft, NT.  Pelvic:  NEFG.  Vagina without discharge.  Cervix normal and IUD string visible.  Uterus NSSC, NT.  Adnexa negative.    Assessment:    Healthy female exam.   IUD surveillance.   Plan:    Education reviewed: safe sex/STD prevention.    F/U for routine GYN care with Dr. Gaynell Face.

## 2013-05-18 ENCOUNTER — Encounter (HOSPITAL_COMMUNITY): Payer: Self-pay | Admitting: Emergency Medicine

## 2013-05-18 ENCOUNTER — Emergency Department (HOSPITAL_COMMUNITY)
Admission: EM | Admit: 2013-05-18 | Discharge: 2013-05-19 | Disposition: A | Discharge status: Home / Self Care | Payer: Medicaid Other | Attending: Emergency Medicine | Admitting: Emergency Medicine

## 2013-05-18 ENCOUNTER — Emergency Department (HOSPITAL_COMMUNITY): Payer: Medicaid Other

## 2013-05-18 DIAGNOSIS — Z87891 Personal history of nicotine dependence: Secondary | ICD-10-CM | POA: Insufficient documentation

## 2013-05-18 DIAGNOSIS — R51 Headache: Secondary | ICD-10-CM | POA: Insufficient documentation

## 2013-05-18 DIAGNOSIS — Z79899 Other long term (current) drug therapy: Secondary | ICD-10-CM | POA: Insufficient documentation

## 2013-05-18 DIAGNOSIS — R002 Palpitations: Secondary | ICD-10-CM | POA: Insufficient documentation

## 2013-05-18 NOTE — ED Notes (Signed)
Pt c/o palpitations with intermittent upper mid chest discomfort. Pt denies changes in diet or medications. A & O, NAD. EKG completed

## 2013-05-18 NOTE — Progress Notes (Signed)
   CARE MANAGEMENT ED NOTE 05/18/2013  Patient:  Holly Brown,Holly Brown   Account Number:  [id number]  Date Initiated:  05/18/2013  Documentation initiated by:  Radford PaxFERRERO,  Subjective/Objective Assessment:   Patient presents to Ed with palpitations and chest discomfort     Subjective/Objective Assessment Detail:     Action/Plan:   EKG completed   Action/Plan Detail:   Anticipated DC Date:       Status Recommendation to Physician:   Result of Recommendation:    Other ED Services  Consult Working Plan    DC Planning Services  Other  PCP issues    Choice offered to / List presented to:            Status of service:  Completed, signed off  ED Comments:   ED Comments Detail:  Patient confirms she has Medicaid insurance.  Patient confirms she does not have  a pcp listed on her Medicaid card.  [facility] provided patient a list of pcps who accept Medicaid insurance in [place].  Patient thankful for resources.

## 2013-05-19 LAB — CBC WITH DIFFERENTIAL/PLATELET
BASOS PCT: 0 % (ref 0–1)
Basophils Absolute: 0 10*3/uL (ref 0.0–0.1)
EOS ABS: 0.2 10*3/uL (ref 0.0–0.7)
EOS PCT: 2 % (ref 0–5)
HEMATOCRIT: 37.4 % (ref 36.0–46.0)
HEMOGLOBIN: 12.7 g/dL (ref 12.0–15.0)
Lymphocytes Relative: 40 % (ref 12–46)
Lymphs Abs: 3.1 10*3/uL (ref 0.7–4.0)
MCH: 30.8 pg (ref 26.0–34.0)
MCHC: 34 g/dL (ref 30.0–36.0)
MCV: 90.6 fL (ref 78.0–100.0)
MONO ABS: 0.5 10*3/uL (ref 0.1–1.0)
MONOS PCT: 6 % (ref 3–12)
Neutro Abs: 4 10*3/uL (ref 1.7–7.7)
Neutrophils Relative %: 51 % (ref 43–77)
Platelets: 292 10*3/uL (ref 150–400)
RBC: 4.13 MIL/uL (ref 3.87–5.11)
RDW: 12.8 % (ref 11.5–15.5)
WBC: 7.8 10*3/uL (ref 4.0–10.5)

## 2013-05-19 LAB — BASIC METABOLIC PANEL
BUN: 15 mg/dL (ref 6–23)
CO2: 24 mEq/L (ref 19–32)
Calcium: 9 mg/dL (ref 8.4–10.5)
Chloride: 103 mEq/L (ref 96–112)
Creatinine, Ser: 0.83 mg/dL (ref 0.50–1.10)
GFR calc Af Amer: >90 mL/min (ref >90)
GLUCOSE: 80 mg/dL (ref 70–99)
POTASSIUM: 3.5 meq/L — AB (ref 3.7–5.3)
Sodium: 139 mEq/L (ref 137–147)

## 2013-05-19 LAB — TSH: TSH: 1.221 u[IU]/mL (ref 0.350–4.500)

## 2013-05-19 MED ORDER — POTASSIUM CHLORIDE CRYS ER 20 MEQ PO TBCR
40.0000 meq | EXTENDED_RELEASE_TABLET | Freq: Once | ORAL | Status: AC
  Administered 2013-05-19: 40 meq via ORAL
  Filled 2013-05-19: qty 2

## 2013-05-19 NOTE — Discharge Instructions (Signed)
Palpitations  A palpitation is the feeling that your heartbeat is irregular or is faster than normal. It may feel like your heart is fluttering or skipping a beat. Palpitations are usually not a serious problem. However, in some cases, you may need further medical evaluation. CAUSES  Palpitations can be caused by:  Smoking.  Caffeine or other stimulants, such as diet pills or energy drinks.  Alcohol.  Stress and anxiety.  Strenuous physical activity.  Fatigue.  Certain medicines.  Heart disease, especially if you have a history of arrhythmias. This includes atrial fibrillation, atrial flutter, or supraventricular tachycardia.  An improperly working pacemaker or defibrillator. DIAGNOSIS  To find the cause of your palpitations, your caregiver will take your history and perform a physical exam. Tests may also be done, including:  Electrocardiography (ECG). This test records the heart's electrical activity.  Cardiac monitoring. This allows your caregiver to monitor your heart rate and rhythm in real time.  Holter monitor. This is a portable device that records your heartbeat and can help diagnose heart arrhythmias. It allows your caregiver to track your heart activity for several days, if needed.  Stress tests by exercise or by giving medicine that makes the heart beat faster. TREATMENT  Treatment of palpitations depends on the cause of your symptoms and can vary greatly. Most cases of palpitations do not require any treatment other than time, relaxation, and monitoring your symptoms. Other causes, such as atrial fibrillation, atrial flutter, or supraventricular tachycardia, usually require further treatment. HOME CARE INSTRUCTIONS   Avoid:  Caffeinated coffee, tea, soft drinks, diet pills, and energy drinks.  Chocolate.  Alcohol.  Stop smoking if you smoke.  Reduce your stress and anxiety. Things that can help you relax include:  A method that measures bodily functions so  you can learn to control them (biofeedback).  Yoga.  Meditation.  Physical activity such as swimming, jogging, or walking.  Get plenty of rest and sleep. SEEK MEDICAL CARE IF:   You continue to have a fast or irregular heartbeat beyond 24 hours.  Your palpitations occur more often. SEEK IMMEDIATE MEDICAL CARE IF:  You develop chest pain or shortness of breath.  You have a severe headache.  You feel dizzy, or you faint. MAKE SURE YOU:  Understand these instructions.  Will watch your condition.  Will get help right away if you are not doing well or get worse. Document Released: 04/04/2000 Document Revised: 08/02/2012 Document Reviewed: 06/06/2011 [facility] Patient Information 2014 [place], [place].   If you were a [facility] patient or do not have insurance, here are some clinics in our community that may be able to provide care for free or on sliding payment scales.  [facility], [address], [place], [phone]; Monday to Friday, 9 a.m. - 7 p.m.; Saturday 9 a.m. to 1 p.m.   [facility], [address], [place]; [phone]; or [address],  [place]; [phone].    [facility] [place], [place]. [address] [place]; [phone]; Monday to Wednesday, 8:30 a.m. - 5 p.m.; Thursday, 8:30 a.m. - 8 p.m.   [facility], [address], [place]; [phone]; Monday to Friday, 8 a.m. - 4:30 p.m.    [facility], [place] [address], [place], [phone]; first and third Saturday of the  month, 9:30 a.m. - 12:30 p.m.   Living [address], [place], [phone]; second Saturday of the month, 9  a.m. -noon.   RESOURCE GUIDE  Dental Problems  Patients with Medicaid: [facility] [phone]                                                                    [phone] [address] [employer] [address] Phone:  [phone]                                                                             Phone:  [phone]  If unable to pay or uninsured, contact:  [facility] or [facility]. to become qualified for the adult dental clinic.  Chronic Pain Problems Contact [facility]  [phone] Patients need to be referred by their primary care doctor.  Insufficient Money for Medicine Contact [employer]:  call "211" or [facility] [phone].  No Primary Care Doctor Call [facility]  [phone] Other agencies that provide inexpensive medical care    [facility]  [phone]    [facility]  [phone]    [facility]  [phone]    [facility]  [phone]    [facility]  [phone]  Psychological Services [facility]  [phone] [facility]  [phone] [facility]   [phone] (emergency services [phone])  Abuse/Neglect [facility] Child Abuse Hotline (484)611-1548 [facility] Child Abuse Hotline 620-593-6989 (After Hours)  Emergency Shelter [facility] 3301373292  Maternity Homes Room at the [place] [facility] [place] [phone] [place] Alert Services [phone]  MRSA Hotline #:   531-863-5993  [facility] Resources  [facility] [place]     [employer]                          [facility]. [address] [place]                        [address]          [address] [place] [address]  [place]                                                Cristobal Goldmann Phone:  [phone]  Phone:  [phone]                   Phone:  [phone]  [facility] Phone:  [phone]  [facility] Child Abuse Hotline (743)786-6519(336) [phone](336) [phone] (After Hours)   Community Resources: *IF YOU ARE IN IMMEDIATE DANGER  CALL 911!  Abuse/Neglect:  Family Services Crisis Hotline [facility]([place]): [phone](336) 203-209-1181 Center Against Violence [facility]([place]): [phone]) [phone]  After hours, holidays and weekends: [phone](336) [phone] National Domestic Violence Hotline: 251 415 7310(612)097-1074  Mental Health: [facility]: [address]: [phone](336) [phone]  Health Clinics:  [facility]): [phone] Monday - Friday 8 AM - 9 PM, Saturday and Sunday 10 AM - 9 PM   [place] [facility]  [address]: [phone](336) [phone] Monday- Friday 8:30 AM - 5:30 PM, Sat 9 AM - 1 PM  24 HR [place] [facility] [address]: [phone](336) [phone] [facility]: [phone] [facility] [address]: [phone]  24 HR [facility]: [address] [phone]

## 2013-05-19 NOTE — ED Provider Notes (Addendum)
CSN: [id number]     Arrival date & time 05/18/13  1914 History   First MD Initiated Contact with Patient 05/18/13 2300     Chief Complaint  Patient presents with  . Palpitations   (Consider location/radiation/quality/duration/timing/severity/associated sxs/prior Treatment) HPI 28 year old female presents to emergency department with 2 days of intermittent sense of palpitations.  She reports that she will have occasional rushes of adrenaline and are associated with headache.  These episodes last less than 1 minute.  Headache is described as global.  No associated symptoms.  She denies previous history of same.  Palpitations have been ongoing for the last 2 days.  She is currently feeling.  These palpitations while in the emergency department.  She denies any medications, no caffeine, no illicit substances.  No family history of similar symptoms.  She does not currently have a primary care Dr.  She denies any additional stressors in her life, no history of anxiety. Past Medical History  Diagnosis Date  . No pertinent past medical history   . Medical history non-contributory    Past Surgical History  Procedure Laterality Date  . Wisdom tooth extraction  2011  . Dilation and curettage of uterus     Family History  Problem Relation Age of Onset  . Cancer Father   . Hypertension Maternal Grandmother   . Diabetes Maternal Grandmother   . Hypertension Paternal Grandmother    History  Substance Use Topics  . Smoking status: Former Smoker -- 0.25 packs/day    Types: Cigarettes    Start date: 12/21/2011  . Smokeless tobacco: Not on file  . Alcohol Use: No   OB History   Grav Para Term Preterm Abortions TAB SAB Ect Mult Living   4 3 3  1  1   3      Review of Systems  See History of Present Illness; otherwise all other systems are reviewed and negative Allergies  Review of patient's allergies indicates no known allergies.  Home Medications   Current Outpatient Rx  Name  Route  Sig   Dispense  Refill  . levonorgestrel (MIRENA) 20 MCG/24HR IUD   Intrauterine   1 each by Intrauterine route once.         . Prenatal Vit-Fe Fumarate-FA (PRENATAL MULTIVITAMIN) TABS   Oral   Take 1 tablet by mouth daily.           BP 119/72  Pulse 62  Temp(Src) 97.9 F (36.6 C) (Oral)  Resp 12  Ht 5\' 10"  (1.778 m)  Wt 198 lb (89.812 kg)  BMI 28.41 kg/m2  SpO2 99%  Breastfeeding? No Physical Exam  Nursing note and vitals reviewed. Constitutional: She is oriented to person, place, and time. She appears well-developed and well-nourished.  HENT:  Head: Normocephalic and atraumatic.  Right Ear: External ear normal.  Left Ear: External ear normal.  Nose: Nose normal.  Mouth/Throat: Oropharynx is clear and moist.  Eyes: Conjunctivae and EOM are normal. Pupils are equal, round, and reactive to light.  Neck: Normal range of motion. Neck supple. No JVD present. No tracheal deviation present. No thyromegaly present.  Cardiovascular: Normal rate, regular rhythm, normal heart sounds and intact distal pulses.  Exam reveals no gallop and no friction rub.   No murmur heard. Pulmonary/Chest: Effort normal and breath sounds normal. No stridor. No respiratory distress. She has no wheezes. She has no rales. She exhibits no tenderness.  Abdominal: Soft. Bowel sounds are normal. She exhibits no distension and no mass. There is  no tenderness. There is no rebound and no guarding.  Musculoskeletal: Normal range of motion. She exhibits no edema and no tenderness.  Lymphadenopathy:    She has no cervical adenopathy.  Neurological: She is alert and oriented to person, place, and time. She has normal reflexes. No cranial nerve deficit. She exhibits normal muscle tone. Coordination normal.  Skin: Skin is warm and dry. No rash noted. No erythema. No pallor.  Psychiatric: She has a normal mood and affect. Her behavior is normal. Judgment and thought content normal.    ED Course  Procedures (including  critical care time) Labs Review Labs Reviewed  BASIC METABOLIC PANEL - Abnormal; Notable for the following:    Potassium 3.5 (*)    All other components within normal limits  CBC WITH DIFFERENTIAL  TSH   Imaging Review Dg Chest 2 View  05/18/2013   CLINICAL DATA:  Chest pain, palpitations  EXAM: CHEST  2 VIEW  COMPARISON:  01/03/2007  FINDINGS: The heart size and mediastinal contours are within normal limits. Both lungs are clear. The visualized skeletal structures are unremarkable.  IMPRESSION: No active cardiopulmonary disease.   Electronically Signed   By: Ruel Favorsrevor  Shick M.D.   On: 05/18/2013 19:57    EKG Interpretation    Date/Time:  Wednesday May 18 2013 19:28:10 EST Ventricular Rate:  59 PR Interval:  144 QRS Duration: 84 QT Interval:  412 QTC Calculation: 408 R Axis:   55 Text Interpretation:  Sinus rhythm Atrial premature complex No old tracing to compare Confirmed by   MD,  ([id number]) on 05/18/2013 11:10:16 PM            MDM   1. Palpitations    28 year old female with reported palpitations.  She is having occasional PACs, but no significant arrhythmia noted.  She has not had a rush of adrenaline with headache.  I felt unlikely she has something is rare as pheochromocytoma.  We'll check baseline labs, and refer her to primary care Dr. for further workup.    Olivia Mackielga M , MD 05/19/13 0017  12:43 AM K is 3.5, will give 40 meq po here, and handout for diet supplementation.  Olivia Mackielga M , MD 05/19/13 [phone]

## 2014-02-03 ENCOUNTER — Other Ambulatory Visit: Payer: Self-pay

## 2014-02-20 ENCOUNTER — Encounter (HOSPITAL_COMMUNITY): Payer: Self-pay | Admitting: Emergency Medicine

## 2014-03-07 LAB — PROCEDURE REPORT - SCANNED: Pap: ABNORMAL — AB

## 2014-05-07 ENCOUNTER — Encounter (HOSPITAL_COMMUNITY): Payer: Self-pay | Admitting: *Deleted

## 2014-05-07 ENCOUNTER — Emergency Department (HOSPITAL_COMMUNITY)
Admission: EM | Admit: 2014-05-07 | Discharge: 2014-05-07 | Disposition: A | Discharge status: Home / Self Care | Payer: Medicaid Other | Attending: Emergency Medicine | Admitting: Emergency Medicine

## 2014-05-07 DIAGNOSIS — W01198A Fall on same level from slipping, tripping and stumbling with subsequent striking against other object, initial encounter: Secondary | ICD-10-CM | POA: Diagnosis not present

## 2014-05-07 DIAGNOSIS — Y9289 Other specified places as the place of occurrence of the external cause: Secondary | ICD-10-CM | POA: Insufficient documentation

## 2014-05-07 DIAGNOSIS — Z043 Encounter for examination and observation following other accident: Secondary | ICD-10-CM | POA: Diagnosis not present

## 2014-05-07 DIAGNOSIS — R569 Unspecified convulsions: Secondary | ICD-10-CM

## 2014-05-07 DIAGNOSIS — Y998 Other external cause status: Secondary | ICD-10-CM | POA: Insufficient documentation

## 2014-05-07 DIAGNOSIS — Y9389 Activity, other specified: Secondary | ICD-10-CM | POA: Insufficient documentation

## 2014-05-07 DIAGNOSIS — Z87891 Personal history of nicotine dependence: Secondary | ICD-10-CM | POA: Insufficient documentation

## 2014-05-07 HISTORY — DX: Unspecified convulsions: R56.9

## 2014-05-07 LAB — BASIC METABOLIC PANEL
ANION GAP: 8 (ref 5–15)
BUN: 9 mg/dL (ref 6–23)
CALCIUM: 9.5 mg/dL (ref 8.4–10.5)
CHLORIDE: 104 meq/L (ref 96–112)
CO2: 29 mmol/L (ref 19–32)
CREATININE: 0.77 mg/dL (ref 0.50–1.10)
GFR calc Af Amer: >90 mL/min (ref >90)
GLUCOSE: 86 mg/dL (ref 70–99)
POTASSIUM: 3.5 mmol/L (ref 3.5–5.1)
Sodium: 141 mmol/L (ref 135–145)

## 2014-05-07 LAB — CBC
HCT: 39.6 % (ref 36.0–46.0)
Hemoglobin: 13.1 g/dL (ref 12.0–15.0)
MCH: 30.5 pg (ref 26.0–34.0)
MCHC: 33.1 g/dL (ref 30.0–36.0)
MCV: 92.1 fL (ref 78.0–100.0)
PLATELETS: 375 10*3/uL (ref 150–400)
RBC: 4.3 MIL/uL (ref 3.87–5.11)
RDW: 13.1 % (ref 11.5–15.5)
WBC: 8.5 10*3/uL (ref 4.0–10.5)

## 2014-05-07 LAB — CBG MONITORING, ED: GLUCOSE-CAPILLARY: 77 mg/dL (ref 70–99)

## 2014-05-07 MED ORDER — LEVETIRACETAM 500 MG PO TABS: 500.0000 mg | ORAL_TABLET | Freq: Two times a day (BID) | ORAL | Status: DC

## 2014-05-07 MED ORDER — ACETAMINOPHEN 500 MG PO TABS
1000.0000 mg | ORAL_TABLET | Freq: Once | ORAL | Status: AC
  Administered 2014-05-07: 1000 mg via ORAL
  Filled 2014-05-07: qty 2

## 2014-05-07 MED ORDER — LEVETIRACETAM IN NACL 1000 MG/100ML IV SOLN
1000.0000 mg | Freq: Once | INTRAVENOUS | Status: AC
  Administered 2014-05-07: 1000 mg via INTRAVENOUS
  Filled 2014-05-07: qty 100

## 2014-05-07 NOTE — Discharge Instructions (Signed)

## 2014-05-07 NOTE — ED Notes (Addendum)
Pt reports having a seizure this am, witnessed by her mother. Reports hx of seizures but 7-8 years ago and pt has never taken seizure meds. Reports hitting back of her head on coffee table. No acute distress noted at this time.

## 2014-05-07 NOTE — ED Provider Notes (Signed)
CSN: [id number]     Arrival date & time 05/07/14  1330 History   First MD Initiated Contact with Patient 05/07/14 1547     Chief Complaint  Patient presents with  . Seizures     (Consider location/radiation/quality/duration/timing/severity/associated sxs/prior Treatment) HPI 29 year old female with past history of one grand mal type seizure during her life about 6 years ago who presents to ED after having a seizure this morning.  Patient states she was eating breakfast and going about her usual day when she was told she had a seizure lasting approximately 1 minute. The seizure was witnessed and she was noted to fall from a seated position and hit the back of her head on the floor. Patient states she was told she was having generalized shaking. No tongue biting or incontinence reported. Patient states after the seizure she had mild chest tightness which she did not have before. She denies having any shortness of breath, chest pain, nausea, vomiting, abdominal pain or other symptoms. Patient states she is currently on her period. She states prior to today she's been in her usual state of health and has had no recent illness. She denies any recent drug or alcohol use. No other complaints at this time. Patient states she has never been on any antiepileptic drugs or seen neurology.   Past Medical History  Diagnosis Date  . No pertinent past medical history   . Medical history non-contributory   . Seizures    Past Surgical History  Procedure Laterality Date  . Wisdom tooth extraction  2011  . Dilation and curettage of uterus     Family History  Problem Relation Age of Onset  . Cancer Father   . Hypertension Maternal Grandmother   . Diabetes Maternal Grandmother   . Hypertension Paternal Grandmother    History  Substance Use Topics  . Smoking status: Former Smoker -- 0.25 packs/day    Types: Cigarettes    Start date: 12/21/2011  . Smokeless tobacco: Not on file  . Alcohol Use: No    OB History    Gravida Para Term Preterm AB TAB SAB Ectopic Multiple Living   4 3 3  1  1   3      Review of Systems  Constitutional: Negative for fever and chills.  HENT: Negative for congestion, rhinorrhea and sore throat.   Eyes: Negative for visual disturbance.  Respiratory: Negative for cough and shortness of breath.   Cardiovascular: Negative for chest pain, palpitations and leg swelling.  Gastrointestinal: Negative for nausea, vomiting, abdominal pain, diarrhea and constipation.  Genitourinary: Negative for dysuria, hematuria, vaginal bleeding and vaginal discharge.  Musculoskeletal: Negative for back pain and neck pain.  Skin: Negative for rash.  Neurological: Positive for seizures. Negative for weakness and headaches.  All other systems reviewed and are negative.     Allergies  Review of patient's allergies indicates no known allergies.  Home Medications   Prior to Admission medications   Medication Sig Start Date End Date Taking? Authorizing Provider  levonorgestrel (MIRENA) 20 MCG/24HR IUD 1 each by Intrauterine route once. Implanted September 19,2014   Yes Historical Provider, MD   BP 113/51 mmHg  Pulse 66  Temp(Src) 98.2 F (36.8 C) (Oral)  Resp 20  Ht 5\' 10"  (1.778 m)  Wt 185 lb (83.915 kg)  BMI 26.54 kg/m2  SpO2 99% Physical Exam  Constitutional: She is oriented to person, place, and time. She appears well-developed and well-nourished. No distress.  HENT:  Head: Normocephalic and atraumatic.  Eyes: Conjunctivae are normal.  Neck: Normal range of motion.  Cardiovascular: Normal rate, regular rhythm, normal heart sounds and intact distal pulses.   No murmur heard. Pulmonary/Chest: Effort normal and breath sounds normal. No respiratory distress. She has no wheezes. She has no rales. She exhibits no tenderness.  Abdominal: Soft. Bowel sounds are normal. She exhibits no distension.  Musculoskeletal: Normal range of motion.  Neurological: She is alert and  oriented to person, place, and time. No cranial nerve deficit. GCS eye subscore is 4. GCS verbal subscore is 5. GCS motor subscore is 6.  HDS, AAOx4. PERRL, EOMI, TML, face sym. CN 2-12 grossly intact. 5/5 sym, no drift, SILT, normal gait and coordination.    Skin: Skin is warm and dry.  Psychiatric: She has a normal mood and affect.  Nursing note and vitals reviewed.   ED Course  Procedures (including critical care time) Labs Review Labs Reviewed  BASIC METABOLIC PANEL  CBC  CBG MONITORING, ED    Imaging Review No results found.   EKG Interpretation None      MDM   Final diagnoses:  None    Patient presents after having seizure. No seizure activity on arrival. Vital signs within normal limits. Neuro exam benign. History suggests likely grand mal type versus syncope. Will load patient with Keppra at this time. Discussed with neurology on call and they recommend starting Keppra 500 mg twice a day. Patient will follow up closely with neurology as an outpatient.  Clinical Impression: 1. Seizure     Disposition: Discharge  Condition: Good  I have discussed the results, Dx and Tx plan with the pt(& family if present). He/she/they expressed understanding and agree(s) with the plan. Discharge instructions discussed at great length. Strict return precautions discussed and pt &/or family have verbalized understanding of the instructions. No further questions at time of discharge.    New Prescriptions   LEVETIRACETAM (KEPPRA) 500 MG TABLET    Take 1 tablet (500 mg total) by mouth 2 (two) times daily.    Follow Up: [facility] [address]  [address] [place] [address]  Schedule an appointment as soon as possible for a visit in 2 weeks   [facility] EMERGENCY DEPARTMENT [address] [id number] [place] [address] [phone]  If symptoms worsen   Pt seen in conjunction with Dr. Audree Camel,  MD  Ames Dura, DO [facility] Emergency Medicine Resident - PGY-2    Ames Dura, MD 05/07/14 6295  Audree Camel, MD 05/11/14 [phone]

## 2014-05-07 NOTE — ED Notes (Addendum)
Pt  States she was sitting in a chair today, states her friend saw her fall out of the chair and start shaking like she was having a seizure. Pt states this occurred around 10 or 11 this am, last seizure 7 years ago, no definite dx then, pt is not on seizure meds. C/o posterior headache from hit her head on end table as well as some chest tightness. Pt alert, oriented, nad.

## 2014-05-07 NOTE — ED Notes (Signed)
Patient called x1 time for a room with no answer.

## 2014-05-24 ENCOUNTER — Encounter: Payer: Self-pay | Admitting: Diagnostic Neuroimaging

## 2014-05-24 ENCOUNTER — Ambulatory Visit (INDEPENDENT_AMBULATORY_CARE_PROVIDER_SITE_OTHER): Payer: Medicaid Other | Admitting: Diagnostic Neuroimaging

## 2014-05-24 ENCOUNTER — Encounter: Payer: Self-pay | Admitting: *Deleted

## 2014-05-24 VITALS — BP 106/65 | HR 79 | Ht 70.0 in | Wt 192.8 lb

## 2014-05-24 DIAGNOSIS — R55 Syncope and collapse: Secondary | ICD-10-CM

## 2014-05-24 DIAGNOSIS — R569 Unspecified convulsions: Secondary | ICD-10-CM

## 2014-05-24 MED ORDER — LEVETIRACETAM 500 MG PO TABS: 500.0000 mg | ORAL_TABLET | Freq: Two times a day (BID) | ORAL | Status: DC

## 2014-05-24 NOTE — Patient Instructions (Signed)
 -   According to [place] law, you can not drive unless you are seizure free for at least 6 months and under physician's care.   - Please maintain seizure precautions. Do not participate in activities where a loss of awareness could harm you or someone else. No swimming alone, no tub bathing, no hot tubs, no driving, no operating motorized vehicles (cars, ATVs, motocycles, etc), lawnmowers or power tools. No standing at heights, such as rooftops, ladders or stairs. Avoid hot objects such as stoves, heaters, open fires. Wear a helmet when riding a bicycle, scooter, skateboard, etc. and avoid areas of traffic. Set your water heater to 120 degrees or less.  

## 2014-05-24 NOTE — Progress Notes (Signed)
[facility]  PATIENT: Holly Brown DOB: 11/21/1985  REFERRING CLINICIAN: ER  HISTORY FROM: patient  REASON FOR VISIT: new consult    HISTORICAL  CHIEF COMPLAINT:  Chief Complaint  Patient presents with  . New Evaluation    seizures    HISTORY OF PRESENT ILLNESS:   29 year old right-handed female here for evaluation of seizure. 2 weeks ago patient was at home, sitting at the countertop on the stool, when without warning she had loss of consciousness, fell backwards and had shaking for approximately 1 minute. No tongue biting or incontinence. No prodromal warning symptoms. Patient was somewhat confused for 2-3 minutes following the event. Event was witnessed by patient's boyfriend. Patient went to the emergency room for evaluation, diagnosed with possible seizure, discharged on levetiracetam 500 mg twice a day.  8 years ago patient had a similar event. Patient had been sitting down, and after standing up she felt room spinning, lightheadedness and passed out. This was witnessed by patient's mother. Patient had some shaking with this event. She was evaluated outpatient clinic which apparently showed no significant abnormalities.  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 She has not been back to work since this event.  No history of significant head trauma, meningitis or encephalitis. No family history of seizure. Patient has had staring spell, daydreaming spells in the past as noted per mother. Usually if mother would Patient on shoulder she would snap out of it. Patient did not have any significant problems paying attention in school.   REVIEW OF SYSTEMS: Full 14 system review of systems performed and notable only for blurred vision chest pain.  ALLERGIES: No Known Allergies  HOME MEDICATIONS: Outpatient Prescriptions Prior to Visit  Medication Sig Dispense Refill  . levonorgestrel (MIRENA) 20 MCG/24HR IUD 1 each by  Intrauterine route once. Implanted September 19,2014    . levETIRAcetam (KEPPRA) 500 MG tablet Take 1 tablet (500 mg total) by mouth 2 (two) times daily. 60 tablet 0   No facility-administered medications prior to visit.    PAST MEDICAL HISTORY: Past Medical History  Diagnosis Date  . No pertinent past medical history   . Medical history non-contributory   . Seizures     PAST SURGICAL HISTORY: Past Surgical History  Procedure Laterality Date  . Wisdom tooth extraction  2011  . Dilation and curettage of uterus      FAMILY HISTORY: Family History  Problem Relation Age of Onset  . Cancer Father   . Hypertension Maternal Grandmother   . Diabetes Maternal Grandmother   . Hypertension Paternal Grandmother     SOCIAL HISTORY:  History   Social History  . Marital Status: Single    Spouse Name: N/A    Number of Children: 3  . Years of Education: college   Occupational History  . [employer]     Social History Main Topics  . Smoking status: Current Some Day Smoker -- 0.25 packs/day    Types: Cigarettes    Start date: 12/21/2011  . Smokeless tobacco: Not on file  . Alcohol Use: 0.0 oz/week    0 Not specified per week     Comment: ocassionally   . Drug Use: No  . Sexual Activity: No   Other Topics Concern  . Not on file   Social History Narrative   Lives with her grandmother and her three children   Does not drink caffeine         PHYSICAL  EXAM  Filed Vitals:   05/24/14 1013  BP: 106/65  Pulse: 79  Height:  (1.778 m)  Weight: 192 lb 12.8 oz (87.454 kg)    Body mass index is 27.66 kg/(m^2).   Visual Acuity Screening   Right eye Left eye Both eyes  Without correction: 20/50 20/100   With correction:       No flowsheet data found.  GENERAL EXAM: Patient is in no distress; well developed, nourished and groomed; neck is supple  CARDIOVASCULAR: Regular rate and rhythm, no murmurs, no carotid bruits  NEUROLOGIC: MENTAL STATUS:  awake, alert, oriented to person, place and time, recent and remote memory intact, normal attention and concentration, language fluent, comprehension intact, naming intact, fund of knowledge appropriate CRANIAL NERVE: no papilledema on fundoscopic exam, pupils equal and reactive to light, visual fields full to confrontation, extraocular muscles intact, no nystagmus, facial sensation and strength symmetric, hearing intact, palate elevates symmetrically, uvula midline, shoulder shrug symmetric, tongue midline. MOTOR: normal bulk and tone, full strength in the BUE, BLE SENSORY: normal and symmetric to light touch, temperature, vibration   COORDINATION: finger-nose-finger, fine finger movements normal REFLEXES: deep tendon reflexes present and symmetric GAIT/STATION: narrow based gait; able to walk tandem; romberg is negative    DIAGNOSTIC DATA (LABS, IMAGING, TESTING) - I reviewed patient records, labs, notes, testing and imaging myself where available.  Lab Results  Component Value Date   WBC 8.5 05/07/2014   HGB 13.1 05/07/2014   HCT 39.6 05/07/2014   MCV 92.1 05/07/2014   PLT 375 05/07/2014      Component Value Date/Time   NA 141 05/07/2014 1434   K 3.5 05/07/2014 1434   CL 104 05/07/2014 1434   CO2 29 05/07/2014 1434   GLUCOSE 86 05/07/2014 1434   BUN 9 05/07/2014 1434   CREATININE 0.77 05/07/2014 1434   CALCIUM 9.5 05/07/2014 1434   PROT 6.4 04/19/2012 1838   ALBUMIN 3.2* 04/19/2012 1838   AST 14 04/19/2012 1838   ALT 10 04/19/2012 1838   ALKPHOS 47 04/19/2012 1838   BILITOT 0.4 04/19/2012 1838   GFRNONAA >90 05/07/2014 1434   GFRAA >90 05/07/2014 1434   No results found for: CHOL, HDL, LDLCALC, LDLDIRECT, TRIG, CHOLHDL No results found for: FAOZ3Y No results found for: VITAMINB12 Lab Results  Component Value Date   TSH 1.221 05/18/2013      ASSESSMENT AND PLAN  29 y.o. year old female here with 2 events of loss of consciousness in her life (2008, 2016)  both  associated with convulsions. First event had prodrome of standing up in room spinning sensation. Second event occurred when patient was sitting down without any warning sign. First event sounds more like an orthostatic syncopal event. The event 2 weeks ago sounds more like a seizure.  Ddx: seizure vs convulsive syncope   PLAN: - Additional workup. - No driving until seizure free x 6 months. - Continue LEV  BID. - Cardiology consult to evaluate for convulsive syncope / arrhythmia as alternate explanation of recent event.   Orders Placed This Encounter  Procedures  . MR Brain W Wo Contrast  . Ambulatory referral to Cardiology  . EEG adult   Meds ordered this encounter  Medications  . levETIRAcetam (KEPPRA) 500 MG tablet    Sig: Take 1 tablet (500 mg total) by mouth 2 (two) times daily.    Dispense:  60 tablet    Refill:  12   Return in about 3 months (around 08/22/2014).  Suanne Marker, MD 05/24/2014, 11:11 AM Certified in Neurology, Neurophysiology and Neuroimaging  [facility] [address], [address] [place], [place] [address] [phone]

## 2014-05-30 ENCOUNTER — Ambulatory Visit (INDEPENDENT_AMBULATORY_CARE_PROVIDER_SITE_OTHER): Payer: Medicaid Other | Admitting: Diagnostic Neuroimaging

## 2014-05-30 DIAGNOSIS — R569 Unspecified convulsions: Secondary | ICD-10-CM

## 2014-05-30 DIAGNOSIS — R55 Syncope and collapse: Secondary | ICD-10-CM

## 2014-06-12 ENCOUNTER — Inpatient Hospital Stay: Admission: RE | Admit: 2014-06-12 | Payer: Medicaid Other | Source: Ambulatory Visit

## 2014-06-14 NOTE — Procedures (Signed)
   [facility]  EEG (ELECTROENCEPHALOGRAM) REPORT   STUDY DATE: 05/30/14 PATIENT NAME: Holly Brown DOB: 12/05/1985 MRN: [id number]  ORDERING CLINICIAN: Joycelyn SchmidVikram Penumalli, MD   TECHNOLOGIST: Gearldine ShownLorraine Jones  TECHNIQUE: Electroencephalogram was recorded utilizing standard 10-20 system of lead placement and reformatted into average and bipolar montages.  RECORDING TIME: 28 minutes  ACTIVATION: hyperventilation and photic stimulation  CLINICAL INFORMATION: 29 year old female with seizure vs syncope.  FINDINGS: Background rhythms of 10-11 hertz and 50-60 microvolts. No focal, lateralizing, epileptiform activity or seizures are seen. Patient recorded in the awake state.   IMPRESSION:  Normal EEG in the awake state.    INTERPRETING PHYSICIAN:  Suanne MarkerVIKRAM R. PENUMALLI, MD Certified in Neurology, Neurophysiology and Neuroimaging  [facility] [address], [address] [place], [place] [address] [phone]

## 2014-06-15 ENCOUNTER — Telehealth: Payer: Self-pay | Admitting: Diagnostic Neuroimaging

## 2014-06-15 ENCOUNTER — Telehealth: Payer: Self-pay | Admitting: *Deleted

## 2014-06-15 MED ORDER — LEVETIRACETAM ER 500 MG PO TB24: 1000.0000 mg | ORAL_TABLET | Freq: Every day | ORAL | Status: DC

## 2014-06-15 NOTE — Telephone Encounter (Signed)
Spoke with the pt on the phone and explained that Dr. Marjory LiesPenumalli would be ok with switching her to the generic version of Keppra XL 1000 mg at bedtime if she wanted to do this. She stated that she would. So i told her I would ask Dr. Marjory LiesPenumalli to order this for her. She stated a thank you

## 2014-06-15 NOTE — Telephone Encounter (Signed)
-----   Message from Elise BenneSamantha Allen, RN sent at 06/15/2014 10:43 AM EST ----- She said she would like to switch to this please. Lelon MastSamantha  ----- Message -----    From: Suanne MarkerVikram R Penumalli, MD    Sent: 06/15/2014  10:29 AM      To: Elise BenneSamantha Allen, RN  We can change to extended release (generic) version. levetiracetam XR 1000mg  at bedtime if desired. -VRP  ----- Message -----    From: Elise BenneSamantha Allen, RN    Sent: 06/15/2014   8:47 AM      To: Suanne MarkerVikram R Penumalli, MD  Pt stating that the 500 mg Keppra dose in the am is making her too tired to function at work. She wants to know if there is a smaller dose available. States that she has been skipping doses to be able to work. Could she take the full 1000mg  at night so she is not sleepy in the am? [place]

## 2014-06-15 NOTE — Telephone Encounter (Signed)
Rx sent in. LEV XR 500mg  (2 tabs at bedtime). -VRP

## 2014-06-20 ENCOUNTER — Encounter: Payer: Self-pay | Admitting: Neurology

## 2014-06-20 ENCOUNTER — Encounter: Payer: Medicaid Other | Admitting: Neurology

## 2014-06-20 NOTE — Progress Notes (Signed)
Patient reported being told by ER to follow-up with Neurology and set up appointment here. After clarifying with patient, she has already been seen by Dr. Marjory LiesPenumalli for the seizures. She would like to continue care with [facility] as scheduled. Patient was not seen. This encounter was created in error - please disregard.

## 2014-06-22 ENCOUNTER — Ambulatory Visit
Admission: RE | Admit: 2014-06-22 | Discharge: 2014-06-22 | Disposition: A | Discharge status: Home / Self Care | Payer: Medicaid Other | Source: Ambulatory Visit | Attending: Diagnostic Neuroimaging | Admitting: Diagnostic Neuroimaging

## 2014-06-22 DIAGNOSIS — R569 Unspecified convulsions: Secondary | ICD-10-CM | POA: Diagnosis not present

## 2014-06-22 DIAGNOSIS — R55 Syncope and collapse: Secondary | ICD-10-CM

## 2014-06-22 MED ORDER — GADOBENATE DIMEGLUMINE 529 MG/ML IV SOLN: 18.0000 mL | Freq: Once | INTRAVENOUS | Status: AC | PRN

## 2014-06-26 ENCOUNTER — Institutional Professional Consult (permissible substitution): Payer: Medicaid Other | Admitting: Cardiology

## 2014-07-04 ENCOUNTER — Ambulatory Visit (INDEPENDENT_AMBULATORY_CARE_PROVIDER_SITE_OTHER): Payer: Medicaid Other | Admitting: Cardiology

## 2014-07-04 ENCOUNTER — Encounter: Payer: Self-pay | Admitting: Cardiology

## 2014-07-04 VITALS — BP 110/74 | HR 73 | Ht 70.0 in | Wt 190.8 lb

## 2014-07-04 DIAGNOSIS — R079 Chest pain, unspecified: Secondary | ICD-10-CM

## 2014-07-04 DIAGNOSIS — R55 Syncope and collapse: Secondary | ICD-10-CM

## 2014-07-04 DIAGNOSIS — R002 Palpitations: Secondary | ICD-10-CM | POA: Insufficient documentation

## 2014-07-04 NOTE — Patient Instructions (Signed)
Your physician recommends that you continue on your current medications as directed. Please refer to the Current Medication list given to you today.  Your physician has requested that you have an echocardiogram. Echocardiography is a painless test that uses sound waves to create images of your heart. It provides your doctor with information about the size and shape of your heart and how well your heart's chambers and valves are working. This procedure takes approximately one hour. There are no restrictions for this procedure.  Your physician has requested that you have an exercise tolerance test. For further information please visit [email or web address]. Please also follow instruction sheet, as given.  Your physician has recommended that you wear an event monitor. Event monitors are medical devices that record the heart's electrical activity. Doctors most often us these monitors to diagnose arrhythmias. Arrhythmias are problems with the speed or rhythm of the heartbeat. The monitor is a small, portable device. You can wear one while you do your normal daily activities. This is usually used to diagnose what is causing palpitations/syncope (passing out).  Your physician recommends that you schedule a follow-up appointment AS NEEDED with Dr. Mayford Knifeurner pending the results of your tests.

## 2014-07-04 NOTE — Progress Notes (Signed)
Cardiology Office Note   Date:  07/04/2014   ID:  Holly Brown, DOB 1985-12-25, MRN [id number]  PCP:  No PCP Per Patient  Cardiologist:   Quintella Reichert, MD   Chief Complaint  Patient presents with  . Palpitations  . Loss of Consciousness      History of Present Illness: Holly Brown is a 29 y.o. female who presents for evaluation of palpitations.  She was seen in the ER in January 2015.  She told them that she would feel occasional adrenaline rushes associated with headaches and usually last about a minute.  Since that ER visit she has continued to have palpitations off and on. She has a ? History of seizures in the past and had a seizure and presented to the ER on 05/07/2014.  She had felt fine that am and was eating breakfast and then had a seizure that was witnessed and lasted about 1 minute.  She hit her head on the coffee table and suffered a minor concussion but there was no tongue biting or urinary incontinence.  After the episode she had some chest tightness.  She denies any SOB.  She has continued to have some mild chest tightness since then.  She describes a fluttering in her chest as well.  This has been off an on for a while but since the seizure it occurs almost on a daily basis.  The chest tightness occurs more when she is stressed and then is she sits down and takes deep breathes it will ease off.  She denies any dizziness, syncope or LE edema.  EKG showed NSR with no ST changes and normal QT    Past Medical History  Diagnosis Date  . No pertinent past medical history   . Medical history non-contributory   . Seizures     Past Surgical History  Procedure Laterality Date  . Wisdom tooth extraction  2011  . Dilation and curettage of uterus       Current Outpatient Prescriptions  Medication Sig Dispense Refill  . levETIRAcetam (KEPPRA XR) 500 MG 24 hr tablet Take 2 tablets (1,000 mg total) by mouth at bedtime. 60 tablet 12  . levonorgestrel (MIRENA) 20  MCG/24HR IUD 1 each by Intrauterine route once. Implanted September 19,2014     No current facility-administered medications for this visit.    Allergies:   Review of patient's allergies indicates no known allergies.    Social History:  The patient  reports that she has been smoking Cigarettes.  She started smoking about 2 years ago. She has been smoking about 0.25 packs per day. She does not have any smokeless tobacco history on file. She reports that she drinks alcohol. She reports that she does not use illicit drugs.   Family History:  The patient's family history includes Cancer in her father; Diabetes in her maternal grandmother and mother; Hypertension in her maternal grandmother and paternal grandmother.    ROS:  Please see the history of present illness.   Otherwise, review of systems are positive for none.   All other systems are reviewed and negative.    PHYSICAL EXAM: VS:  BP 110/74 mmHg  Pulse 73  Ht  (1.778 m)  Wt 190 lb 12.8 oz (86.546 kg)  BMI 27.38 kg/m2  SpO2 97%  LMP 06/22/2014 (Exact Date) , BMI Body mass index is 27.38 kg/(m^2). GEN: Well nourished, well developed, in no acute distress HEENT: normal Neck: no JVD, carotid bruits, or masses  Cardiac: RRR; no murmurs, rubs, or gallops,no edema  Respiratory:  clear to auscultation bilaterally, normal work of breathing GI: soft, nontender, nondistended, + BS MS: no deformity or atrophy Skin: warm and dry, no rash Neuro:  Strength and sensation are intact Psych: euthymic mood, full affect   EKG:  EKG is not ordered today.    Recent Labs: 05/07/2014: BUN 9; Creatinine 0.77; Hemoglobin 13.1; Platelets 375; Potassium 3.5; Sodium 141    Lipid Panel No results found for: CHOL, TRIG, HDL, CHOLHDL, VLDL, LDLCALC, LDLDIRECT    Wt Readings from Last 3 Encounters:  07/04/14 190 lb 12.8 oz (86.546 kg)  06/20/14 195 lb (88.451 kg)  05/24/14 192 lb 12.8 oz (87.454 kg)      ASSESSMENT AND PLAN:  1.   Palpitations - these have been fairly frequent.  With history of seizure need to rule out a primary arrhythmia as the cause of the seizure. I will get an event monitor to assess. 2.  Chest tightness that is most likely stress related.  I will get an ETT to assess further. 3.  Seizure - ? Secondary to syncope vs. Primary seizure - I will get a 2D echo to assess LVF   Current medicines are reviewed at length with the patient today.  The patient does not have concerns regarding medicines.  The following changes have been made:  no change  Labs/ tests ordered today include: ETT/ event monitor /2D echo  Orders Placed This Encounter  Procedures  . Exercise tolerance test  . Cardiac event monitor  . 2D Echocardiogram without contrast     Disposition:   FU with me PRN pending results of studies i  SignedQuintella Reichert, , R, MD  07/04/2014 10:24 AM    [facility] [address], [place], [place]  [address] Phone: [phone]; Fax: [phone]

## 2014-07-06 ENCOUNTER — Encounter: Payer: Self-pay | Admitting: *Deleted

## 2014-07-06 ENCOUNTER — Ambulatory Visit (HOSPITAL_COMMUNITY): Payer: Medicaid Other | Attending: Cardiology

## 2014-07-06 ENCOUNTER — Encounter (INDEPENDENT_AMBULATORY_CARE_PROVIDER_SITE_OTHER): Payer: Medicaid Other

## 2014-07-06 DIAGNOSIS — R55 Syncope and collapse: Secondary | ICD-10-CM | POA: Insufficient documentation

## 2014-07-06 DIAGNOSIS — R079 Chest pain, unspecified: Secondary | ICD-10-CM

## 2014-07-06 DIAGNOSIS — R002 Palpitations: Secondary | ICD-10-CM

## 2014-07-06 NOTE — Progress Notes (Signed)
2D Echo completed. 07/06/2014 

## 2014-07-06 NOTE — Progress Notes (Signed)
Patient ID: Holly Brown, female   DOB: 04/18/1986, 29 y.o.   MRN: [id number] [employer] 30 day cardiac event monitor applied to patient.

## 2014-08-18 ENCOUNTER — Ambulatory Visit (INDEPENDENT_AMBULATORY_CARE_PROVIDER_SITE_OTHER): Payer: Medicaid Other | Admitting: Physician Assistant

## 2014-08-18 DIAGNOSIS — R55 Syncope and collapse: Secondary | ICD-10-CM

## 2014-08-18 DIAGNOSIS — R079 Chest pain, unspecified: Secondary | ICD-10-CM

## 2014-08-18 NOTE — Progress Notes (Signed)
Exercise Treadmill Test  Pre-Exercise Testing Evaluation Rhythm: normal sinus  Rate: 72 bpm     Test  Exercise Tolerance Test Ordering MD: Armanda Magicraci Turner, MD  Interpreting MD: Tereso NewcomerScott , PA-C  Unique Test No: 1  Treadmill:  1  Indication for ETT: chest pain - rule out ischemia  Contraindication to ETT: No   Stress Modality: exercise - treadmill  Cardiac Imaging Performed: non   Protocol: standard Bruce - maximal  Max BP:  178/72  Max MPHR (bpm):  194 85% MPR (bpm):  165  MPHR obtained (bpm):  179 % MPHR obtained:  92  Reached 85% MPHR (min:sec):  6:24 Total Exercise Time (min-sec):  9:00  Workload in METS:  10.1 Borg Scale: 16  Reason ETT Terminated:  desired heart rate attained    ST Segment Analysis At Rest: normal ST segments - no evidence of significant ST depression With Exercise: no evidence of significant ST depression  Other Information Arrhythmia:  No Angina during ETT:  absent (0) Quality of ETT:  diagnostic  ETT Interpretation:  normal - no evidence of ischemia by ST analysis  Comments: Good exercise capacity. No chest pain. Normal BP response to exercise. No ST changes to suggest ischemia.   Recommendations: FU with Dr. Armanda Magicraci Turner as planned. Signed,  Tereso NewcomerScott , PA-C   08/18/2014 9:36 AM

## 2014-08-21 ENCOUNTER — Telehealth: Payer: Self-pay | Admitting: Cardiology

## 2014-08-21 NOTE — Telephone Encounter (Signed)
Left message to call back  

## 2014-08-21 NOTE — Telephone Encounter (Signed)
2D echo was normal.  CHADS2VASC score is 1 (female) so no anticoagulation needed.  Please find out if she is still having palpitations

## 2014-08-21 NOTE — Telephone Encounter (Signed)
Please let patient know that heart monitor showed NSR with an episode of atrial fibrillation with controlled VR.

## 2014-08-22 ENCOUNTER — Ambulatory Visit: Payer: Medicaid Other | Admitting: Diagnostic Neuroimaging

## 2014-08-24 NOTE — Telephone Encounter (Signed)
Informed patient of results and verbal understanding expressed.  Patient st she is not having anymore palpitations. Patient understands to call if she has palpitations or other cardiac symptoms.

## 2014-08-28 ENCOUNTER — Encounter: Payer: Self-pay | Admitting: Diagnostic Neuroimaging

## 2014-09-01 ENCOUNTER — Encounter: Payer: Self-pay | Admitting: Diagnostic Neuroimaging

## 2014-09-01 ENCOUNTER — Encounter: Payer: Self-pay | Admitting: *Deleted

## 2014-09-01 ENCOUNTER — Ambulatory Visit (INDEPENDENT_AMBULATORY_CARE_PROVIDER_SITE_OTHER): Payer: Medicaid Other | Admitting: Diagnostic Neuroimaging

## 2014-09-01 VITALS — BP 111/78 | HR 78 | Ht 70.0 in | Wt 198.7 lb

## 2014-09-01 DIAGNOSIS — R55 Syncope and collapse: Secondary | ICD-10-CM

## 2014-09-01 DIAGNOSIS — R569 Unspecified convulsions: Secondary | ICD-10-CM

## 2014-09-01 MED ORDER — LEVETIRACETAM ER 500 MG PO TB24: 1000.0000 mg | ORAL_TABLET | Freq: Every day | ORAL | Status: DC

## 2014-09-01 NOTE — Patient Instructions (Signed)
Continue current medications. 

## 2014-09-01 NOTE — Progress Notes (Signed)
[facility]  PATIENT: Holly Brown DOB: 24-Oct-1985  REFERRING CLINICIAN:  HISTORY FROM: patient  REASON FOR VISIT: follow up    HISTORICAL  CHIEF COMPLAINT:  Chief Complaint  Patient presents with  . Follow-up    convulsions     HISTORY OF PRESENT ILLNESS:   UPDATE 09/01/14: Since last visit, doing well. No further events. Went to cardiology --> echo and stress test good. Heart monitor detected 1 episode of afib with controlled ventricular response, and due to "CHADS2VASC score is 1 (female) so no anticoagulation needed" (per Dr. Mayford Knife, cardiology).   PRIOR HPI (05/27/14, VRP): 29 year old right-handed female here for evaluation of seizure. 2 weeks ago patient was at home, sitting at the countertop on the stool, when without warning she had loss of consciousness, fell backwards and had shaking for approximately 1 minute. No tongue biting or incontinence. No prodromal warning symptoms. Patient was somewhat confused for 2-3 minutes following the event. Event was witnessed by patient's boyfriend. Patient went to the emergency room for evaluation, diagnosed with possible seizure, discharged on levetiracetam 500 mg twice a day. 8 years ago patient had a similar event. Patient had been sitting down, and after standing up she felt room spinning, lightheadedness and passed out. This was witnessed by patient's mother. Patient had some shaking with this event. She was evaluated outpatient clinic which apparently showed no significant abnormalities. 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 She has not been back to work since this event. No history of significant head trauma, meningitis or encephalitis. No family history of seizure. Patient has had staring spell, daydreaming spells in the past as noted per mother. Usually if mother would Patient on shoulder she would snap out of it. Patient did not have any significant problems paying attention in  school.   REVIEW OF SYSTEMS: Full 14 system review of systems performed and notable only for as per HPI.  ALLERGIES: No Known Allergies  HOME MEDICATIONS: Outpatient Prescriptions Prior to Visit  Medication Sig Dispense Refill  . levonorgestrel (MIRENA) 20 MCG/24HR IUD 1 each by Intrauterine route once. Implanted September 19,2014    . levETIRAcetam (KEPPRA XR) 500 MG 24 hr tablet Take 2 tablets (1,000 mg total) by mouth at bedtime. 60 tablet 12   No facility-administered medications prior to visit.    PAST MEDICAL HISTORY: Past Medical History  Diagnosis Date  . No pertinent past medical history   . Medical history non-contributory   . Seizures     PAST SURGICAL HISTORY: Past Surgical History  Procedure Laterality Date  . Wisdom tooth extraction  2011  . Dilation and curettage of uterus      FAMILY HISTORY: Family History  Problem Relation Age of Onset  . Cancer Father     colon  . Hypertension Maternal Grandmother   . Diabetes Maternal Grandmother   . Hypertension Paternal Grandmother   . Diabetes Mother     SOCIAL HISTORY:  History   Social History  . Marital Status: Single    Spouse Name: N/A  . Number of Children: 3  . Years of Education: college   Occupational History  . [employer]     Social History Main Topics  . Smoking status: Current Some Day Smoker -- 0.25 packs/day    Types: Cigarettes    Start date: 12/21/2011  . Smokeless tobacco: Not on file  . Alcohol Use: 0.0 oz/week    0 Standard drinks or equivalent per week  Comment: ocassionally   . Drug Use: No  . Sexual Activity: No   Other Topics Concern  . Not on file   Social History Narrative   Lives with her grandmother and her three children   Does not drink caffeine         PHYSICAL EXAM  Filed Vitals:   09/01/14 0821  BP: 111/78  Pulse: 78  Height: 5\' 10"  (1.778 m)  Weight: 198 lb 11.2 oz (90.13 kg)    Body mass index is 28.51 kg/(m^2).  No exam data  present  No flowsheet data found.  GENERAL EXAM: Patient is in no distress; well developed, nourished and groomed; neck is supple  CARDIOVASCULAR: Regular rate and rhythm, no murmurs, no carotid bruits  NEUROLOGIC: MENTAL STATUS: awake, alert, language fluent, comprehension intact, naming intact, fund of knowledge appropriate CRANIAL NERVE: no papilledema on fundoscopic exam, pupils equal and reactive to light, visual fields full to confrontation, extraocular muscles intact, no nystagmus, facial sensation and strength symmetric, hearing intact, palate elevates symmetrically, uvula midline, shoulder shrug symmetric, tongue midline. MOTOR: normal bulk and tone, full strength in the BUE, BLE SENSORY: normal and symmetric to light touch, temperature, vibration   COORDINATION: finger-nose-finger, fine finger movements normal REFLEXES: deep tendon reflexes present and symmetric GAIT/STATION: narrow based gait; able to walk tandem; romberg is negative    DIAGNOSTIC DATA (LABS, IMAGING, TESTING) - I reviewed patient records, labs, notes, testing and imaging myself where available.  Lab Results  Component Value Date   WBC 8.5 05/07/2014   HGB 13.1 05/07/2014   HCT 39.6 05/07/2014   MCV 92.1 05/07/2014   PLT 375 05/07/2014      Component Value Date/Time   NA 141 05/07/2014 1434   K 3.5 05/07/2014 1434   CL 104 05/07/2014 1434   CO2 29 05/07/2014 1434   GLUCOSE 86 05/07/2014 1434   BUN 9 05/07/2014 1434   CREATININE 0.77 05/07/2014 1434   CALCIUM 9.5 05/07/2014 1434   PROT 6.4 04/19/2012 1838   ALBUMIN 3.2* 04/19/2012 1838   AST 14 04/19/2012 1838   ALT 10 04/19/2012 1838   ALKPHOS 47 04/19/2012 1838   BILITOT 0.4 04/19/2012 1838   GFRNONAA >90 05/07/2014 1434   GFRAA >90 05/07/2014 1434   No results found for: CHOL, HDL, LDLCALC, LDLDIRECT, TRIG, CHOLHDL No results found for: ZOXW9UHGBA1C No results found for: VITAMINB12 Lab Results  Component Value Date   TSH 1.221 05/18/2013     06/23/14 MRI brain - normal [I reviewed images myself and agree with interpretation. -VRP]   05/30/14 EEG - normal  07/06/14 TTE - normal  08/18/14 exercise treadmill test - normal - no evidence of ischemia by ST analysis     ASSESSMENT AND PLAN  29 y.o. year old female here with 2 events of loss of consciousness in her life (2008, 2016)  both associated with convulsions. First event had prodrome of standing up in room spinning sensation. Second event (05/07/14) occurred when patient was sitting down without any warning sign. First event sounds more like an orthostatic syncopal event. Second event sounds more like a seizure. Doing well on LEV XR 1000mg  qhs.  Ddx: seizure vs convulsive syncope  PLAN: - No driving until seizure free x 6 months (last event 05/07/14). - Continue LEV XR 1000mg  qhs  Meds ordered this encounter  Medications  . levETIRAcetam (KEPPRA XR) 500 MG 24 hr tablet    Sig: Take 2 tablets (1,000 mg total) by mouth at bedtime.  Dispense:  180 tablet    Refill:  4   Return in about 6 months (around 03/04/2015).  I spent 25 minutes of face to face time with patient. Greater than 50% of time was spent in counseling and coordination of care with patient.     Suanne MarkerVIKRAM R. PENUMALLI, MD 09/01/2014, 8:47 AM Certified in Neurology, Neurophysiology and Neuroimaging  [facility] [address], [address] [place], [place] [address] [phone]

## 2014-10-04 ENCOUNTER — Telehealth: Payer: Self-pay | Admitting: Diagnostic Neuroimaging

## 2014-10-04 NOTE — Telephone Encounter (Signed)
Patient called requesting return to work note to be faxed to her employer. She states she has faxed it 2x but employer is saying they did not get. She is wanting it faxed from [employer]. Her employer is [employer] and fax # [phone]. Please call and advise. Patient can be reached at [phone].

## 2014-10-04 NOTE — Telephone Encounter (Signed)
Called and left a message informing the pt that I had faxed the paperwork and had received a conformation. i asked her to call back for anything else and with any questions

## 2014-11-06 ENCOUNTER — Encounter: Payer: Self-pay | Admitting: *Deleted

## 2014-11-06 ENCOUNTER — Telehealth: Payer: Self-pay | Admitting: Diagnostic Neuroimaging

## 2014-11-06 NOTE — Telephone Encounter (Addendum)
The patient is wanting a letter stating that she has been seizure free for 6 month dated today so she could go back to work. The fax number to her job is [phone] The best number to contact her is [phone].

## 2014-11-06 NOTE — Telephone Encounter (Addendum)
Tried to reach patient by mobile number, recording stated person could not receive calls. Unable to leave voice mail re: her letter she requested has been successfully faxed per her request.   4:26 pm   Attempted to reach patient again, unsuccessful.

## 2015-01-06 ENCOUNTER — Encounter (HOSPITAL_COMMUNITY): Payer: Self-pay | Admitting: Emergency Medicine

## 2015-01-06 ENCOUNTER — Emergency Department (HOSPITAL_COMMUNITY)
Admission: EM | Admit: 2015-01-06 | Discharge: 2015-01-06 | Disposition: A | Discharge status: Home / Self Care | Payer: Medicaid Other | Attending: Physician Assistant | Admitting: Physician Assistant

## 2015-01-06 DIAGNOSIS — R51 Headache: Secondary | ICD-10-CM | POA: Insufficient documentation

## 2015-01-06 DIAGNOSIS — R112 Nausea with vomiting, unspecified: Secondary | ICD-10-CM | POA: Insufficient documentation

## 2015-01-06 DIAGNOSIS — Z720 Tobacco use: Secondary | ICD-10-CM | POA: Insufficient documentation

## 2015-01-06 DIAGNOSIS — Z3202 Encounter for pregnancy test, result negative: Secondary | ICD-10-CM | POA: Insufficient documentation

## 2015-01-06 DIAGNOSIS — G40909 Epilepsy, unspecified, not intractable, without status epilepticus: Secondary | ICD-10-CM | POA: Insufficient documentation

## 2015-01-06 DIAGNOSIS — R519 Headache, unspecified: Secondary | ICD-10-CM

## 2015-01-06 LAB — CBC WITH DIFFERENTIAL/PLATELET
BASOS PCT: 0 %
Basophils Absolute: 0 10*3/uL (ref 0.0–0.1)
EOS PCT: 1 %
Eosinophils Absolute: 0.1 10*3/uL (ref 0.0–0.7)
HCT: 38.3 % (ref 36.0–46.0)
Hemoglobin: 12.8 g/dL (ref 12.0–15.0)
Lymphocytes Relative: 27 %
Lymphs Abs: 2.8 10*3/uL (ref 0.7–4.0)
MCH: 30.7 pg (ref 26.0–34.0)
MCHC: 33.4 g/dL (ref 30.0–36.0)
MCV: 91.8 fL (ref 78.0–100.0)
MONO ABS: 0.7 10*3/uL (ref 0.1–1.0)
Monocytes Relative: 7 %
NEUTROS ABS: 6.7 10*3/uL (ref 1.7–7.7)
Neutrophils Relative %: 65 %
PLATELETS: 313 10*3/uL (ref 150–400)
RBC: 4.17 MIL/uL (ref 3.87–5.11)
RDW: 12.8 % (ref 11.5–15.5)
WBC: 10.5 10*3/uL (ref 4.0–10.5)

## 2015-01-06 LAB — BASIC METABOLIC PANEL
ANION GAP: 8 (ref 5–15)
BUN: 17 mg/dL (ref 6–20)
CALCIUM: 9 mg/dL (ref 8.9–10.3)
CO2: 23 mmol/L (ref 22–32)
Chloride: 105 mmol/L (ref 101–111)
Creatinine, Ser: 1.05 mg/dL — ABNORMAL HIGH (ref 0.44–1.00)
GFR calc non Af Amer: >60 mL/min (ref >60)
Glucose, Bld: 88 mg/dL (ref 65–99)
Potassium: 3.8 mmol/L (ref 3.5–5.1)
Sodium: 136 mmol/L (ref 135–145)

## 2015-01-06 LAB — HCG, SERUM, QUALITATIVE: Preg, Serum: NEGATIVE

## 2015-01-06 MED ORDER — PROCHLORPERAZINE EDISYLATE 5 MG/ML IJ SOLN
10.0000 mg | Freq: Once | INTRAMUSCULAR | Status: AC
  Administered 2015-01-06: 10 mg via INTRAVENOUS
  Filled 2015-01-06: qty 2

## 2015-01-06 MED ORDER — KETOROLAC TROMETHAMINE 30 MG/ML IJ SOLN
30.0000 mg | Freq: Once | INTRAMUSCULAR | Status: AC
  Administered 2015-01-06: 30 mg via INTRAVENOUS
  Filled 2015-01-06: qty 1

## 2015-01-06 MED ORDER — SODIUM CHLORIDE 0.9 % IV BOLUS (SEPSIS)
1000.0000 mL | Freq: Once | INTRAVENOUS | Status: AC
  Administered 2015-01-06: 1000 mL via INTRAVENOUS

## 2015-01-06 NOTE — ED Provider Notes (Addendum)
CSN: [id number]     Arrival date & time 01/06/15  1948 History   First MD Initiated Contact with Patient 01/06/15 2050     Chief Complaint  Patient presents with  . Migraine     (Consider location/radiation/quality/duration/timing/severity/associated sxs/prior Treatment) HPI   Patient is a 29 year old female presenting with headache. Patient states she woke up with a headache. She says it's been getting worse over the course the day. She called her mom and her mom told her might be migraines and she came here to the hospital. She tried ibuprofen and Tylenol at home. She said the lights are bothering her eyes, she has nausea.  Past Medical History  Diagnosis Date  . No pertinent past medical history   . Medical history non-contributory   . Seizures    Past Surgical History  Procedure Laterality Date  . Wisdom tooth extraction  2011  . Dilation and curettage of uterus     Family History  Problem Relation Age of Onset  . Cancer Father     colon  . Hypertension Maternal Grandmother   . Diabetes Maternal Grandmother   . Hypertension Paternal Grandmother   . Diabetes Mother    Social History  Substance Use Topics  . Smoking status: Current Some Day Smoker -- 0.25 packs/day    Types: Cigarettes    Start date: 12/21/2011  . Smokeless tobacco: None  . Alcohol Use: 0.0 oz/week    0 Standard drinks or equivalent per week     Comment: ocassionally    OB History    Gravida Para Term Preterm AB TAB SAB Ectopic Multiple Living   Review of Systems  Constitutional: Negative for activity change and fatigue.  HENT: Negative for congestion and drooling.   Eyes: Negative for discharge.  Respiratory: Negative for cough and chest tightness.   Cardiovascular: Negative for chest pain.  Gastrointestinal: Positive for nausea and vomiting. Negative for abdominal distention.  Genitourinary: Negative for dysuria and difficulty urinating.  Musculoskeletal: Negative for  joint swelling.  Skin: Negative for rash.  Allergic/Immunologic: Negative for immunocompromised state.  Neurological: Positive for headaches. Negative for dizziness, seizures, syncope, speech difficulty and weakness.  Psychiatric/Behavioral: Negative for behavioral problems and agitation.      Allergies  Review of patient's allergies indicates no known allergies.  Home Medications   Prior to Admission medications   Medication Sig Start Date End Date Taking? Authorizing Provider  acetaminophen (TYLENOL) 325 MG tablet Take 650 mg by mouth every 6 (six) hours as needed for moderate pain.   Yes Historical Provider, MD  ibuprofen (ADVIL,MOTRIN) 200 MG tablet Take 400 mg by mouth every 6 (six) hours as needed for moderate pain.   Yes Historical Provider, MD  levETIRAcetam (KEPPRA XR) 500 MG 24 hr tablet Take 2 tablets (1,000 mg total) by mouth at bedtime. 09/01/14  Yes Suanne Marker, MD  naproxen sodium (ANAPROX) 220 MG tablet Take 660 mg by mouth daily as needed (pain).   Yes Historical Provider, MD  levonorgestrel (MIRENA) 20 MCG/24HR IUD 1 each by Intrauterine route once. Implanted September 19,2014    Historical Provider, MD   BP 128/71 mmHg  Pulse 103  Temp(Src) 98.5 F (36.9 C) (Oral)  Resp 20  Ht  (1.803 m)  Wt 195 lb (88.451 kg)  BMI 27.21 kg/m2  SpO2 100%  LMP 12/19/2014 (Exact Date) Physical Exam  Constitutional: She is oriented to person,  place, and time. She appears well-developed and well-nourished.  HENT:  Head: Normocephalic and atraumatic.  Eyes: Conjunctivae are normal. Pupils are equal, round, and reactive to light. Right eye exhibits no discharge.  No papilledema noted  Neck: Neck supple.  Cardiovascular: Normal rate, regular rhythm and normal heart sounds.   No murmur heard. Pulmonary/Chest: Effort normal and breath sounds normal. She has no wheezes. She has no rales.  Abdominal: Soft. She exhibits no distension. There is no tenderness.   Musculoskeletal: Normal range of motion. She exhibits no edema.  Neurological: She is oriented to person, place, and time. No cranial nerve deficit.  Skin: Skin is warm and dry. No rash noted. She is not diaphoretic.  Psychiatric: She has a normal mood and affect. Her behavior is normal.  Nursing note and vitals reviewed.   ED Course  Procedures (including critical care time) Labs Review Labs Reviewed  CBC WITH DIFFERENTIAL/PLATELET  BASIC METABOLIC PANEL  HCG, SERUM, QUALITATIVE    Imaging Review No results found. I have personally reviewed and evaluated these images and lab results as part of my medical decision-making.   EKG Interpretation None      MDM   Final diagnoses:  None   patient is a healthy 29 year old female presenting today with worsening headache. Patient states that she feels like it's a migraine according to gooogle and her mom. She has photophobia and nausea. Do not suspect subarachnoid hemorrhage given the fact that its gotten worse over time rather than a thunderclap headache. Patient has normal cranial nerve exam. No papilledema. We will treat with migraine cocktail and anticipate improvement.  Patietn was inteded to have neurology follow up but has stopped going, we will encourage her and her mother to make sure she goes to her neurology follow up.   Courteney Randall An, MD 01/06/15 2322   Courteney Randall An, MD 01/06/15 2332

## 2015-01-06 NOTE — ED Notes (Signed)
Pt c/o migraine since this morning. Has taken Aleve/Tylenol/Ibuprofen without alleviation. C/o nausea and light sensitivity. No other c/c. Ambulatory to triage.

## 2015-01-06 NOTE — ED Notes (Signed)
Pt given discharge information and voiced understanding. Pt departed with family member, under her own power and in NAD.

## 2015-01-06 NOTE — ED Notes (Signed)
Nurse drawing labs. 

## 2015-01-06 NOTE — ED Notes (Signed)
PTA pt has taken  ibuprofen @ approx 1630 today. Aleve last taken at 0500 today.

## 2015-03-09 ENCOUNTER — Ambulatory Visit: Payer: Medicaid Other | Admitting: Diagnostic Neuroimaging

## 2015-03-23 ENCOUNTER — Encounter: Payer: Self-pay | Admitting: Diagnostic Neuroimaging

## 2015-03-23 ENCOUNTER — Ambulatory Visit (INDEPENDENT_AMBULATORY_CARE_PROVIDER_SITE_OTHER): Payer: Medicaid Other | Admitting: Diagnostic Neuroimaging

## 2015-03-23 VITALS — BP 106/70 | HR 73 | Ht 70.5 in | Wt 211.2 lb

## 2015-03-23 DIAGNOSIS — G43109 Migraine with aura, not intractable, without status migrainosus: Secondary | ICD-10-CM | POA: Diagnosis not present

## 2015-03-23 DIAGNOSIS — R569 Unspecified convulsions: Secondary | ICD-10-CM

## 2015-03-23 MED ORDER — LEVETIRACETAM ER 500 MG PO TB24: 1000.0000 mg | ORAL_TABLET | Freq: Every day | ORAL | Status: DC

## 2015-03-23 MED ORDER — RIZATRIPTAN BENZOATE 10 MG PO TBDP: 10.0000 mg | ORAL_TABLET | ORAL | Status: DC | PRN

## 2015-03-23 MED ORDER — TOPIRAMATE 50 MG PO TABS: 50.0000 mg | ORAL_TABLET | Freq: Two times a day (BID) | ORAL | Status: DC

## 2015-03-23 NOTE — Patient Instructions (Signed)
Thank you for coming to see [place] at [facility]. I hope we have been able to provide you high quality care today.  You may receive a patient satisfaction survey over the next few weeks. We would appreciate your feedback and comments so that we may continue to improve ourselves and the health of our patients.  - I will check MRI brain  - try topiramate 5m at bedtime; after 1-2 weeks increase to twice a day - use rizatriptan as needed for breakthrough headaches; max 2 tabs per day or 8 tabs per month   ~~~~~~~~~~~~~~~~~~~~~~~~~~~~~~~~~~~~~~~~~~~~~~~~~~~~~~~~~~~~~~~~~  DR. 'S GUIDE TO HAPPY AND HEALTHY LIVING These are some of my general health and wellness recommendations. Some of them may apply to you better than others. Please use common sense as you try these suggestions and feel free to ask me any questions.   ACTIVITY/FITNESS Mental, social, emotional and physical stimulation are very important for brain and body health. Try learning a new activity (arts, music, language, sports, games).  Keep moving your body to the best of your abilities. You can do this at home, inside or outside, the park, community center, gym or anywhere you like. Consider a physical therapist or personal trainer to get started. Consider the app Sworkit. Fitness trackers such as smart-watches, smart-phones or Fitbits can help as well.   NUTRITION Eat more plants: colorful vegetables, nuts, seeds and berries.  Eat less sugar, salt, preservatives and processed foods.  Avoid toxins such as cigarettes and alcohol.  Drink water when you are thirsty. Warm water with a slice of lemon is an excellent morning drink to start the day.  Consider these websites for more information The Nutrition Source (hhttps://www.henry-hernandez.biz/ [employer] Nutrition (wWindowBlog.ch   RELAXATION Consider practicing mindfulness meditation or other relaxation  techniques such as deep breathing, prayer, yoga, tai chi, massage. See website mindful.org or the apps Headspace or Calm to help get started.   SLEEP Try to get at least 7-8+ hours sleep per day. Regular exercise and reduced caffeine will help you sleep better. Practice good sleep hygeine techniques. See website sleep.org for more information.   PLANNING Prepare estate planning, living will, healthcare POA documents. Sometimes this is best planned with the help of an attorney. Theconversationproject.org and agingwithdignity.org are excellent resources.

## 2015-03-23 NOTE — Progress Notes (Signed)
[facility]  PATIENT: Holly Brown DOB: 01/27/1986  REFERRING CLINICIAN:  HISTORY FROM: patient  REASON FOR VISIT: follow up    HISTORICAL  CHIEF COMPLAINT:  Chief Complaint  Patient presents with  . Follow-up    HISTORY OF PRESENT ILLNESS:   UPDATE 03/23/15: Since last visit, no seizures. However, now having new onset headaches since Sept 2016, went to ER, had migraine treatments. Has throbbing, global HA, with photophobia, severe, hrs at a time, 1-2 per week. No nausea or vomiting. No triggers. Sometimes sees black dots/floaters before headaches.  UPDATE 09/01/14: Since last visit, doing well. No further events. Went to cardiology --> echo and stress test good. Heart monitor detected 1 episode of afib with controlled ventricular response, and due to "CHADS2VASC score is 1 (female) so no anticoagulation needed" (per Dr. Mayford Knife, cardiology).   PRIOR HPI (05/27/14, VRP): 29 year old right-handed female here for evaluation of seizure. 2 weeks ago patient was at home, sitting at the countertop on the stool, when without warning she had loss of consciousness, fell backwards and had shaking for approximately 1 minute. No tongue biting or incontinence. No prodromal warning symptoms. Patient was somewhat confused for 2-3 minutes following the event. Event was witnessed by patient's boyfriend. Patient went to the emergency room for evaluation, diagnosed with possible seizure, discharged on levetiracetam 500 mg twice a day. 8 years ago patient had a similar event. Patient had been sitting down, and after standing up she felt room spinning, lightheadedness and passed out. This was witnessed by patient's mother. Patient had some shaking with this event. She was evaluated outpatient clinic which apparently showed no significant abnormalities. 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 She has not been back to work since this event. No history of  significant head trauma, meningitis or encephalitis. No family history of seizure. Patient has had staring spell, daydreaming spells in the past as noted per mother. Usually if mother would Patient on shoulder she would snap out of it. Patient did not have any significant problems paying attention in school.   REVIEW OF SYSTEMS: Full 14 system review of systems performed and notable only for as per HPI.  ALLERGIES: No Known Allergies  HOME MEDICATIONS: Outpatient Prescriptions Prior to Visit  Medication Sig Dispense Refill  . acetaminophen (TYLENOL) 325 MG tablet Take 650 mg by mouth every 6 (six) hours as needed for moderate pain.    Marland Kitchen ibuprofen (ADVIL,MOTRIN) 200 MG tablet Take 400 mg by mouth every 6 (six) hours as needed for moderate pain.    Marland Kitchen levonorgestrel (MIRENA) 20 MCG/24HR IUD 1 each by Intrauterine route once. Implanted September 19,2014    . naproxen sodium (ANAPROX) 220 MG tablet Take 660 mg by mouth as needed (pain).     Marland Kitchen levETIRAcetam (KEPPRA XR) 500 MG 24 hr tablet Take 2 tablets (1,000 mg total) by mouth at bedtime. 180 tablet 4   No facility-administered medications prior to visit.    PAST MEDICAL HISTORY: Past Medical History  Diagnosis Date  . No pertinent past medical history   . Medical history non-contributory   . Seizures (HCC)     PAST SURGICAL HISTORY: Past Surgical History  Procedure Laterality Date  . Wisdom tooth extraction  2011  . Dilation and curettage of uterus      FAMILY HISTORY: Family History  Problem Relation Age of Onset  . Cancer Father     colon  . Hypertension Maternal Grandmother   .  Diabetes Maternal Grandmother   . Hypertension Paternal Grandmother   . Diabetes Mother     SOCIAL HISTORY:  Social History   Social History  . Marital Status: Single    Spouse Name: N/A  . Number of Children: 3  . Years of Education: college   Occupational History  . [employer]     Social History Main Topics  . Smoking  status: Current Some Day Smoker -- 0.25 packs/day    Types: Cigarettes    Start date: 12/21/2011  . Smokeless tobacco: Not on file  . Alcohol Use: 0.0 oz/week    0 Standard drinks or equivalent per week     Comment: ocassionally   . Drug Use: No  . Sexual Activity: No   Other Topics Concern  . Not on file   Social History Narrative   Lives with her grandmother and her three children   Does not drink caffeine         PHYSICAL EXAM  Filed Vitals:   03/23/15 1138  BP: 106/70  Pulse: 73  Height: 5' 10.5" (1.791 m)  Weight: 211 lb 3.2 oz (95.8 kg)    Body mass index is 29.87 kg/(m^2).  No exam data present  No flowsheet data found.  GENERAL EXAM: Patient is in no distress; well developed, nourished and groomed; neck is supple  CARDIOVASCULAR: Regular rate and rhythm, no murmurs, no carotid bruits  NEUROLOGIC: MENTAL STATUS: awake, alert, language fluent, comprehension intact, naming intact, fund of knowledge appropriate CRANIAL NERVE: no papilledema on fundoscopic exam, pupils equal and reactive to light, visual fields full to confrontation, extraocular muscles intact, no nystagmus, facial sensation and strength symmetric, hearing intact, palate elevates symmetrically, uvula midline, shoulder shrug symmetric, tongue midline. MOTOR: normal bulk and tone, full strength in the BUE, BLE SENSORY: normal and symmetric to light touch, temperature, vibration   COORDINATION: finger-nose-finger, fine finger movements normal REFLEXES: deep tendon reflexes present and symmetric GAIT/STATION: narrow based gait; able to walk tandem; romberg is negative    DIAGNOSTIC DATA (LABS, IMAGING, TESTING) - I reviewed patient records, labs, notes, testing and imaging myself where available.  Lab Results  Component Value Date   WBC 10.5 01/06/2015   HGB 12.8 01/06/2015   HCT 38.3 01/06/2015   MCV 91.8 01/06/2015   PLT 313 01/06/2015      Component Value Date/Time   NA 136  01/06/2015 2135   K 3.8 01/06/2015 2135   CL 105 01/06/2015 2135   CO2 23 01/06/2015 2135   GLUCOSE 88 01/06/2015 2135   BUN 17 01/06/2015 2135   CREATININE 1.05* 01/06/2015 2135   CALCIUM 9.0 01/06/2015 2135   PROT 6.4 04/19/2012 1838   ALBUMIN 3.2* 04/19/2012 1838   AST 14 04/19/2012 1838   ALT 10 04/19/2012 1838   ALKPHOS 47 04/19/2012 1838   BILITOT 0.4 04/19/2012 1838   GFRNONAA >60 01/06/2015 2135   GFRAA >60 01/06/2015 2135   No results found for: CHOL, HDL, LDLCALC, LDLDIRECT, TRIG, CHOLHDL No results found for: ZOXW9U No results found for: VITAMINB12 Lab Results  Component Value Date   TSH 1.221 05/18/2013    06/23/14 MRI brain - normal [I reviewed images myself and agree with interpretation. -VRP]   05/30/14 EEG - normal  07/06/14 TTE - normal  08/18/14 exercise treadmill test - normal - no evidence of ischemia by ST analysis     ASSESSMENT AND PLAN  29 y.o. year old female here with 2 events of loss of consciousness  in her life (2008, 2016)  both associated with convulsions. First event had prodrome of standing up in room spinning sensation. Second event (05/07/14) occurred when patient was sitting down without any warning sign. First event sounds more like an orthostatic syncopal event. Second event sounds more like a seizure. Doing well on LEV XR 1000mg  qhs.  Now with new onset headaches with migraine features since Sept 2016. Likely migraine with aura. Will check MRI to rule out secondary causes.   Dx:  Convulsions, unspecified convulsion type (HCC) - Plan: MR Brain Wo Contrast  Migraine with aura and without status migrainosus, not intractable - Plan: MR Brain Wo Contrast   PLAN: I spent 25 minutes of face to face time with patient. Greater than 50% of time was spent in counseling and coordination of care with patient.   - Continue LEV XR 1000mg  qhs - start TPX 50mg  qhs for migraine prevention; then increase to BID - rizatriptan prn breakthrough  headaches - check MRI brain to rule out secondary causes of headaches  Meds ordered this encounter  Medications  . topiramate (TOPAMAX) 50 MG tablet    Sig: Take 1 tablet (50 mg total) by mouth 2 (two) times daily.    Dispense:  60 tablet    Refill:  6  . rizatriptan (MAXALT-MLT) 10 MG disintegrating tablet    Sig: Take 1 tablet (10 mg total) by mouth as needed for migraine. May repeat in 2 hours if needed    Dispense:  9 tablet    Refill:  6  . levETIRAcetam (KEPPRA XR) 500 MG 24 hr tablet    Sig: Take 2 tablets (1,000 mg total) by mouth at bedtime.    Dispense:  180 tablet    Refill:  4   Return in about 3 months (around 06/21/2015).     Suanne MarkerVIKRAM R. , MD 03/23/2015, 12:09 PM Certified in Neurology, Neurophysiology and Neuroimaging  [facility] [address], [address] [place], [place] [address] [phone]

## 2015-04-10 ENCOUNTER — Ambulatory Visit (INDEPENDENT_AMBULATORY_CARE_PROVIDER_SITE_OTHER): Payer: Medicaid Other | Admitting: Certified Nurse Midwife

## 2015-04-10 VITALS — BP 115/78 | HR 92 | Wt 214.0 lb

## 2015-04-10 DIAGNOSIS — Z30432 Encounter for removal of intrauterine contraceptive device: Secondary | ICD-10-CM

## 2015-04-10 DIAGNOSIS — Z30013 Encounter for initial prescription of injectable contraceptive: Secondary | ICD-10-CM

## 2015-04-10 MED ORDER — CITRANATAL HARMONY 30-1-260 MG PO CAPS: 1.0000 | ORAL_CAPSULE | Freq: Every day | ORAL | Status: DC

## 2015-04-10 MED ORDER — MEDROXYPROGESTERONE ACETATE 150 MG/ML IM SUSP: 150.0000 mg | INTRAMUSCULAR | Status: DC

## 2015-04-10 NOTE — Progress Notes (Signed)
Patient ID: Holly Brown, female   DOB: 07/16/1985, 29 y.o.   MRN: [id number]  MIRENA REMOVAL   Reasons  for removal:  Is having severe lower abdominal pain, and irregular bleeding. Has the same bleeding pattern with Nexplanon and Depo injections in the past.  IUD was placed 01/18/13.       A timeout was performed confirming the patient, the procedure and allergy status. The patient was placed in the lithotomy position, a speculum was placed.  Cervix and strings visualized.   Long forceps used in a strile manner.  Stings grasped with long forceps and device removed intact.  The patient tolerated the procedure well.  New contraceptive method: Depo-Injections.  Prenatal vitamins given to the patient.

## 2015-04-13 ENCOUNTER — Telehealth: Payer: Self-pay | Admitting: *Deleted

## 2015-04-13 ENCOUNTER — Ambulatory Visit
Admission: RE | Admit: 2015-04-13 | Discharge: 2015-04-13 | Disposition: A | Discharge status: Home / Self Care | Payer: Medicaid Other | Source: Ambulatory Visit | Attending: Diagnostic Neuroimaging | Admitting: Diagnostic Neuroimaging

## 2015-04-13 DIAGNOSIS — G43109 Migraine with aura, not intractable, without status migrainosus: Secondary | ICD-10-CM | POA: Diagnosis not present

## 2015-04-13 DIAGNOSIS — R569 Unspecified convulsions: Secondary | ICD-10-CM

## 2015-04-13 LAB — SURESWAB, VAGINOSIS/VAGINITIS PLUS
Atopobium vaginae: 6 Log (cells/mL)
C. TROPICALIS, DNA: NOT DETECTED
C. albicans, DNA: NOT DETECTED
C. glabrata, DNA: NOT DETECTED
C. parapsilosis, DNA: NOT DETECTED
C. trachomatis RNA, TMA: NOT DETECTED
GARDNERELLA VAGINALIS: 7.3 Log (cells/mL)
LACTOBACILLUS SPECIES: NOT DETECTED Log (cells/mL)
MEGASPHAERA SPECIES: 7.3 Log (cells/mL)
N. gonorrhoeae RNA, TMA: DETECTED — AB
T. VAGINALIS RNA, QL TMA: DETECTED — AB

## 2015-04-13 NOTE — Telephone Encounter (Signed)
Patient is bleeding after removal of IUD- heavy with clots. 12:52 Attempt to call patient back- LM  On VM- can have a withdrawal bleed if it goes on for more than 7 days call for appointment.

## 2015-04-17 ENCOUNTER — Other Ambulatory Visit: Payer: Self-pay | Admitting: Certified Nurse Midwife

## 2015-04-17 DIAGNOSIS — A5909 Other urogenital trichomoniasis: Secondary | ICD-10-CM

## 2015-04-17 DIAGNOSIS — N760 Acute vaginitis: Secondary | ICD-10-CM

## 2015-04-17 MED ORDER — TERCONAZOLE 0.4 % VA CREA: 1.0000 | TOPICAL_CREAM | Freq: Every day | VAGINAL | Status: DC

## 2015-04-17 MED ORDER — TINIDAZOLE 500 MG PO TABS: 2.0000 g | ORAL_TABLET | Freq: Every day | ORAL | Status: DC

## 2015-04-17 MED ORDER — FLUCONAZOLE 100 MG PO TABS: 100.0000 mg | ORAL_TABLET | Freq: Once | ORAL | Status: DC

## 2015-04-19 ENCOUNTER — Ambulatory Visit (INDEPENDENT_AMBULATORY_CARE_PROVIDER_SITE_OTHER): Payer: Medicaid Other | Admitting: *Deleted

## 2015-04-19 VITALS — BP 109/75 | HR 75 | Temp 98.8°F | Wt 214.0 lb

## 2015-04-19 DIAGNOSIS — A64 Unspecified sexually transmitted disease: Secondary | ICD-10-CM

## 2015-04-19 DIAGNOSIS — A549 Gonococcal infection, unspecified: Secondary | ICD-10-CM

## 2015-04-19 MED ORDER — CEFTRIAXONE SODIUM 1 G IJ SOLR
250.0000 mg | Freq: Once | INTRAMUSCULAR | Status: AC
  Administered 2015-04-19: 250 mg via INTRAMUSCULAR

## 2015-04-19 NOTE — Progress Notes (Signed)
Patient is in the office for Rocephin injection for GC. She is aware to avoid IC afterward and she will need to come back for TOC in 6-8 weeks. Patient is going to start her depo but she forgot her injection today. She is going to abstain until she gets her depo.

## 2015-04-26 ENCOUNTER — Ambulatory Visit: Payer: Self-pay | Admitting: Certified Nurse Midwife

## 2015-05-10 ENCOUNTER — Ambulatory Visit: Payer: Self-pay | Admitting: Certified Nurse Midwife

## 2015-05-15 ENCOUNTER — Ambulatory Visit: Payer: Self-pay | Admitting: Certified Nurse Midwife

## 2015-06-27 ENCOUNTER — Ambulatory Visit: Payer: Medicaid Other | Admitting: Diagnostic Neuroimaging

## 2015-07-11 ENCOUNTER — Telehealth: Payer: Self-pay | Admitting: *Deleted

## 2015-07-11 NOTE — Telephone Encounter (Signed)
Spoke with patient and rescheduled her FU for 07/18/15 due to provider being out of office with illness. She verbalized understanding.

## 2015-07-12 ENCOUNTER — Ambulatory Visit: Payer: Medicaid Other | Admitting: Diagnostic Neuroimaging

## 2015-07-18 ENCOUNTER — Ambulatory Visit: Payer: Self-pay | Admitting: Diagnostic Neuroimaging

## 2015-07-20 ENCOUNTER — Encounter: Payer: Self-pay | Admitting: Diagnostic Neuroimaging

## 2015-09-28 ENCOUNTER — Ambulatory Visit: Payer: Medicaid Other | Admitting: Certified Nurse Midwife

## 2015-10-10 ENCOUNTER — Encounter: Payer: Self-pay | Admitting: Certified Nurse Midwife

## 2015-10-10 ENCOUNTER — Encounter: Payer: Self-pay | Admitting: Internal Medicine

## 2015-10-10 ENCOUNTER — Ambulatory Visit (INDEPENDENT_AMBULATORY_CARE_PROVIDER_SITE_OTHER): Payer: Medicaid Other | Admitting: Certified Nurse Midwife

## 2015-10-10 VITALS — BP 113/74 | HR 75 | Wt 216.0 lb

## 2015-10-10 DIAGNOSIS — Z30018 Encounter for initial prescription of other contraceptives: Secondary | ICD-10-CM

## 2015-10-10 DIAGNOSIS — N926 Irregular menstruation, unspecified: Secondary | ICD-10-CM

## 2015-10-10 DIAGNOSIS — Z01419 Encounter for gynecological examination (general) (routine) without abnormal findings: Secondary | ICD-10-CM | POA: Diagnosis not present

## 2015-10-10 DIAGNOSIS — Z800 Family history of malignant neoplasm of digestive organs: Secondary | ICD-10-CM

## 2015-10-10 DIAGNOSIS — Z113 Encounter for screening for infections with a predominantly sexual mode of transmission: Secondary | ICD-10-CM

## 2015-10-10 DIAGNOSIS — Z0000 Encounter for general adult medical examination without abnormal findings: Secondary | ICD-10-CM | POA: Diagnosis not present

## 2015-10-10 DIAGNOSIS — N921 Excessive and frequent menstruation with irregular cycle: Secondary | ICD-10-CM

## 2015-10-10 MED ORDER — ETONOGESTREL-ETHINYL ESTRADIOL 0.12-0.015 MG/24HR VA RING: VAGINAL_RING | VAGINAL | Status: DC

## 2015-10-10 NOTE — Progress Notes (Signed)
Patient ID: Holly Brown, female   DOB: 11/03/1985, 30 y.o.   MRN: [id number]    Subjective:      Holly KayDesiree E Orrick is a 30 y.o. female here for a routine exam.  Current complaints: desires contraception d/t heavy periods with clots, menorrhagia and irregular periods. Reports two periods in May.   IUD was removed  04/10/15.  Started having regular periods in January.  Depo injections worked in the past for her to have amenorrhea, but does not want to start back on Depo d/t concerns of weight gain.  Currently smoking about 1-2X/wk.  Is trying to quit smoking.  Desires to [employer].   Employed full time.    Personal health questionnaire:  Is patient Ashkenazi Jewish, have a family history of breast and/or ovarian cancer: no Is there a family history of uterine cancer diagnosed at age < 3450, gastrointestinal cancer, urinary tract cancer, family member who is a Personnel officerLynch syndrome-associated carrier: yes, deceased from colon cancer Is the patient overweight and hypertensive, family history of diabetes, personal history of gestational diabetes, preeclampsia or PCOS: yes Is patient over 155, have PCOS,  family history of premature CHD under age 28, diabetes, smoke, have hypertension or peripheral artery disease:  yes At any time, has a partner hit, kicked or otherwise hurt or frightened you?: no Over the past 2 weeks, have you felt down, depressed or hopeless?: no Over the past 2 weeks, have you felt little interest or pleasure in doing things?:no   Gynecologic History Patient's last menstrual period was 08/24/2015. Contraception: abstinence Last Pap: unknown. Results were: normal according to the patient Last mammogram: N/A.  Obstetric History OB History  Gravida Para Term Preterm AB SAB TAB Ectopic Multiple Living  4 3 3  1 1    3     # Outcome Date GA Lbr Len/2nd Weight Sex Delivery Anes PTL Lv  4 Term 11/08/12 [id number]  7 lb 12.6 oz (3.532 kg) M Vag-Spont None  Y  3 Term     M Vag-Spont   Y  2  Term     F Vag-Spont   Y  1 SAB               Past Medical History  Diagnosis Date  . No pertinent past medical history   . Medical history non-contributory   . Seizures [facility](HCC)     Past Surgical History  Procedure Laterality Date  . Wisdom tooth extraction  2011  . Dilation and curettage of uterus       Current outpatient prescriptions:  .  levETIRAcetam (KEPPRA XR) 500 MG 24 hr tablet, Take 2 tablets (1,000 mg total) by mouth at bedtime., Disp: 180 tablet, Rfl: 4 .  acetaminophen (TYLENOL) 325 MG tablet, Take 650 mg by mouth every 6 (six) hours as needed for moderate pain. Reported on 10/10/2015, Disp: , Rfl:  .  ibuprofen (ADVIL,MOTRIN) 200 MG tablet, Take 400 mg by mouth every 6 (six) hours as needed for moderate pain. Reported on 10/10/2015, Disp: , Rfl:  .  naproxen sodium (ANAPROX) 220 MG tablet, Take 660 mg by mouth as needed (pain). Reported on 10/10/2015, Disp: , Rfl:  .  rizatriptan (MAXALT-MLT) 10 MG disintegrating tablet, Take 1 tablet (10 mg total) by mouth as needed for migraine. May repeat in 2 hours if needed (Patient not taking: Reported on 10/10/2015), Disp: 9 tablet, Rfl: 6 .  topiramate (TOPAMAX) 50 MG tablet, Take 1 tablet (50 mg total) by mouth 2 (  two) times daily. (Patient not taking: Reported on 10/10/2015), Disp: 60 tablet, Rfl: 6 No Known Allergies  Social History  Substance Use Topics  . Smoking status: Current Some Day Smoker -- 0.25 packs/day    Types: Cigarettes    Start date: 12/21/2011  . Smokeless tobacco: Not on file  . Alcohol Use: 0.0 oz/week    0 Standard drinks or equivalent per week     Comment: ocassionally     Family History  Problem Relation Age of Onset  . Cancer Father     colon  . Hypertension Maternal Grandmother   . Diabetes Maternal Grandmother   . Hypertension Paternal Grandmother   . Diabetes Mother       Review of Systems  Constitutional: negative for fatigue and weight loss Respiratory: negative for cough and  wheezing Cardiovascular: negative for chest pain, fatigue and palpitations Gastrointestinal: negative for abdominal pain and change in bowel habits Musculoskeletal:negative for myalgias Neurological: negative for gait problems and tremors Behavioral/Psych: negative for abusive relationship, depression Endocrine: negative for temperature intolerance   Genitourinary:negative for abnormal menstrual periods, genital lesions, hot flashes, sexual problems and vaginal discharge Integument/breast: negative for breast lump, breast tenderness, nipple discharge and skin lesion(s)    Objective:       BP 113/74 mmHg  Pulse 75  Wt 216 lb (97.977 kg)  LMP 08/24/2015 General:   alert  Skin:   no rash or abnormalities  Lungs:   clear to auscultation bilaterally  Heart:   regular rate and rhythm, S1, S2 normal, no murmur, click, rub or gallop  Breasts:   normal without suspicious masses, skin or nipple changes or axillary nodes  Abdomen:  normal findings: no organomegaly, soft, non-tender and no hernia  Pelvis:  External genitalia: normal general appearance Urinary system: urethral meatus normal and bladder without fullness, nontender Vaginal: normal without tenderness, induration or masses Cervix: normal appearance Adnexa: normal bimanual exam Uterus: anteverted and non-tender, normal size   Lab Review Urine pregnancy test Labs reviewed yes Radiologic studies reviewed no  50% of 30 min visit spent on counseling and coordination of care.   Assessment:    Healthy female exam.   STD screening exam  Irregular periods with menorrhagia  Family hx of colon CA in father    Plan:    Education reviewed: calcium supplements, depression evaluation, low fat, low cholesterol diet, safe sex/STD prevention, self breast exams, skin cancer screening and weight bearing exercise. Contraception: NuvaRing vaginal inserts. Follow up in: 1 year.   No orders of the defined types were placed in this  encounter.   Orders Placed This Encounter  Procedures  . CBC with Differential/Platelet  . Comprehensive metabolic panel  . TSH  . Hepatitis B surface antigen  . RPR  . Hepatitis C antibody  . HIV antibody  . Ambulatory referral to Gastroenterology    Referral Priority:  Routine    Referral Type:  Consultation    Referral Reason:  Specialty Services Required    Number of Visits Requested:  1   Need to obtain previous records Possible management options include: genetics counseling Follow up as needed.

## 2015-10-11 ENCOUNTER — Encounter: Payer: Self-pay | Admitting: Internal Medicine

## 2015-10-11 LAB — COMPREHENSIVE METABOLIC PANEL
A/G RATIO: 1.4 (ref 1.2–2.2)
ALBUMIN: 4.3 g/dL (ref 3.5–5.5)
ALK PHOS: 65 IU/L (ref 39–117)
ALT: 11 IU/L (ref 0–32)
AST: 14 IU/L (ref 0–40)
BILIRUBIN TOTAL: 0.7 mg/dL (ref 0.0–1.2)
BUN / CREAT RATIO: 7 — AB (ref 9–23)
BUN: 7 mg/dL (ref 6–20)
CHLORIDE: 99 mmol/L (ref 96–106)
CO2: 22 mmol/L (ref 18–29)
Calcium: 9.3 mg/dL (ref 8.7–10.2)
Creatinine, Ser: 0.94 mg/dL (ref 0.57–1.00)
GFR calc non Af Amer: 83 mL/min/{1.73_m2} (ref >59)
GFR, EST AFRICAN AMERICAN: 96 mL/min/{1.73_m2} (ref >59)
GLUCOSE: 95 mg/dL (ref 65–99)
Globulin, Total: 3.1 g/dL (ref 1.5–4.5)
POTASSIUM: 3.8 mmol/L (ref 3.5–5.2)
Sodium: 139 mmol/L (ref 134–144)
Total Protein: 7.4 g/dL (ref 6.0–8.5)

## 2015-10-11 LAB — HEPATITIS C ANTIBODY

## 2015-10-11 LAB — CBC WITH DIFFERENTIAL/PLATELET
BASOS ABS: 0 10*3/uL (ref 0.0–0.2)
BASOS: 0 %
EOS (ABSOLUTE): 0.2 10*3/uL (ref 0.0–0.4)
Eos: 2 %
Hematocrit: 39.2 % (ref 34.0–46.6)
Hemoglobin: 13 g/dL (ref 11.1–15.9)
Immature Grans (Abs): 0 10*3/uL (ref 0.0–0.1)
Immature Granulocytes: 0 %
LYMPHS ABS: 3 10*3/uL (ref 0.7–3.1)
Lymphs: 38 %
MCH: 30 pg (ref 26.6–33.0)
MCHC: 33.2 g/dL (ref 31.5–35.7)
MCV: 90 fL (ref 79–97)
Monocytes Absolute: 0.5 10*3/uL (ref 0.1–0.9)
Monocytes: 7 %
NEUTROS ABS: 4.2 10*3/uL (ref 1.4–7.0)
Neutrophils: 53 %
PLATELETS: 402 10*3/uL — AB (ref 150–379)
RBC: 4.34 x10E6/uL (ref 3.77–5.28)
RDW: 12.8 % (ref 12.3–15.4)
WBC: 7.9 10*3/uL (ref 3.4–10.8)

## 2015-10-11 LAB — HEPATITIS B SURFACE ANTIGEN: Hepatitis B Surface Ag: NEGATIVE

## 2015-10-11 LAB — RPR: RPR Ser Ql: NONREACTIVE

## 2015-10-11 LAB — HIV ANTIBODY (ROUTINE TESTING W REFLEX): HIV Screen 4th Generation wRfx: NONREACTIVE

## 2015-10-11 LAB — TSH: TSH: 1.19 u[IU]/mL (ref 0.450–4.500)

## 2015-10-12 LAB — PAP IG W/ RFLX HPV ASCU: PAP SMEAR COMMENT: 0

## 2015-10-13 LAB — NUSWAB VG+, CANDIDA 6SP
ATOPOBIUM VAGINAE: HIGH {score} — AB
BVAB 2: HIGH Score — AB
CANDIDA GLABRATA, NAA: NEGATIVE
CANDIDA KRUSEI, NAA: NEGATIVE
CANDIDA LUSITANIAE, NAA: NEGATIVE
CANDIDA PARAPSILOSIS, NAA: NEGATIVE
Candida albicans, NAA: NEGATIVE
Candida tropicalis, NAA: NEGATIVE
Chlamydia trachomatis, NAA: NEGATIVE
Neisseria gonorrhoeae, NAA: NEGATIVE
Trich vag by NAA: POSITIVE — AB

## 2015-10-16 ENCOUNTER — Other Ambulatory Visit: Payer: Self-pay | Admitting: Certified Nurse Midwife

## 2015-10-16 DIAGNOSIS — N760 Acute vaginitis: Principal | ICD-10-CM

## 2015-10-16 DIAGNOSIS — A5901 Trichomonal vulvovaginitis: Secondary | ICD-10-CM

## 2015-10-16 DIAGNOSIS — B9689 Other specified bacterial agents as the cause of diseases classified elsewhere: Secondary | ICD-10-CM

## 2015-10-16 MED ORDER — TINIDAZOLE 500 MG PO TABS: 2.0000 g | ORAL_TABLET | Freq: Every day | ORAL | Status: DC

## 2015-11-15 ENCOUNTER — Telehealth: Payer: Self-pay | Admitting: *Deleted

## 2015-11-19 ENCOUNTER — Other Ambulatory Visit: Payer: Self-pay | Admitting: *Deleted

## 2015-11-19 DIAGNOSIS — N760 Acute vaginitis: Principal | ICD-10-CM

## 2015-11-19 DIAGNOSIS — B9689 Other specified bacterial agents as the cause of diseases classified elsewhere: Secondary | ICD-10-CM

## 2015-11-19 DIAGNOSIS — A599 Trichomoniasis, unspecified: Secondary | ICD-10-CM

## 2015-11-19 DIAGNOSIS — A5901 Trichomonal vulvovaginitis: Secondary | ICD-10-CM

## 2015-11-19 MED ORDER — TINIDAZOLE 500 MG PO TABS: 2.0000 g | ORAL_TABLET | Freq: Every day | ORAL | 0 refills | Status: DC

## 2015-11-19 MED ORDER — METRONIDAZOLE 500 MG PO TABS: 2000.0000 mg | ORAL_TABLET | Freq: Once | ORAL | 0 refills | Status: AC

## 2015-11-20 ENCOUNTER — Other Ambulatory Visit: Payer: Self-pay | Admitting: Certified Nurse Midwife

## 2015-11-28 NOTE — Telephone Encounter (Signed)
Patient aware of lab result and plan of care.

## 2015-11-28 NOTE — Telephone Encounter (Signed)
Lab result discussed with patient and she understands plan of care.

## 2015-11-29 ENCOUNTER — Telehealth: Payer: Self-pay | Admitting: *Deleted

## 2015-11-29 NOTE — Telephone Encounter (Signed)
Patient is aware of PAP result and has an appointment scheduled for 12/11/15 per patient.

## 2015-11-29 NOTE — Telephone Encounter (Signed)
Message left for patient to call office

## 2015-11-30 ENCOUNTER — Encounter: Payer: Self-pay | Admitting: *Deleted

## 2015-12-04 ENCOUNTER — Encounter (HOSPITAL_COMMUNITY): Payer: Self-pay | Admitting: *Deleted

## 2015-12-04 ENCOUNTER — Emergency Department (HOSPITAL_COMMUNITY)
Admission: EM | Admit: 2015-12-04 | Discharge: 2015-12-04 | Disposition: A | Discharge status: Home / Self Care | Payer: Medicaid Other | Attending: Emergency Medicine | Admitting: Emergency Medicine

## 2015-12-04 DIAGNOSIS — L509 Urticaria, unspecified: Secondary | ICD-10-CM | POA: Insufficient documentation

## 2015-12-04 DIAGNOSIS — Z791 Long term (current) use of non-steroidal anti-inflammatories (NSAID): Secondary | ICD-10-CM | POA: Insufficient documentation

## 2015-12-04 DIAGNOSIS — L508 Other urticaria: Secondary | ICD-10-CM

## 2015-12-04 DIAGNOSIS — Z79899 Other long term (current) drug therapy: Secondary | ICD-10-CM | POA: Diagnosis not present

## 2015-12-04 DIAGNOSIS — F17210 Nicotine dependence, cigarettes, uncomplicated: Secondary | ICD-10-CM | POA: Diagnosis not present

## 2015-12-04 DIAGNOSIS — R21 Rash and other nonspecific skin eruption: Secondary | ICD-10-CM | POA: Diagnosis present

## 2015-12-04 MED ORDER — FAMOTIDINE 20 MG PO TABS: 20.0000 mg | ORAL_TABLET | Freq: Two times a day (BID) | ORAL | 0 refills | Status: DC

## 2015-12-04 MED ORDER — DIPHENHYDRAMINE HCL 25 MG PO CAPS: 25.0000 mg | ORAL_CAPSULE | Freq: Four times a day (QID) | ORAL | 0 refills | Status: DC | PRN

## 2015-12-04 MED ORDER — DIPHENHYDRAMINE HCL 50 MG/ML IJ SOLN
25.0000 mg | Freq: Once | INTRAMUSCULAR | Status: AC
  Administered 2015-12-04: 25 mg via INTRAVENOUS
  Filled 2015-12-04: qty 1

## 2015-12-04 MED ORDER — METHYLPREDNISOLONE SODIUM SUCC 125 MG IJ SOLR
125.0000 mg | Freq: Once | INTRAMUSCULAR | Status: AC
  Administered 2015-12-04: 125 mg via INTRAVENOUS
  Filled 2015-12-04: qty 2

## 2015-12-04 MED ORDER — FAMOTIDINE IN NACL 20-0.9 MG/50ML-% IV SOLN
20.0000 mg | Freq: Once | INTRAVENOUS | Status: AC
  Administered 2015-12-04: 20 mg via INTRAVENOUS
  Filled 2015-12-04 (×2): qty 50

## 2015-12-04 NOTE — Progress Notes (Signed)
Entered in d/c instructions Medicaid [place] Access Covered Patient     As a Medicaid client you MUST contact DSS/SSI each time you change address, move to another [place] or another state to keep your address updated  [place] Transportation to Dr appts if you are have full Medicaid: [phone], [phone]   InstructionsHaynes Bast: [place]: [phone] [address] [place], [place] [address] [email or web address].ncdhhs.gov/ Use this website to assist with understanding your coverage finding medicaid doctors & to renew application  [place] dept public health     Medicaid [place] access response hx indicates the assigned pcp [facility] [address] [place], [place] [address] [phone]   Instructions: This is your assigned Medicaid [place] access doctor If you prefer to see another Medicaid doctor other than the one on your Medicaid card PLEASE CALL DSS [phone] or [phone]

## 2015-12-04 NOTE — ED Triage Notes (Signed)
Pt reports hives all over since last night.  Took benadryl 25mg  0700 today.  Denies eating new food or new meds, or new detergent

## 2015-12-04 NOTE — Progress Notes (Signed)
Medicaid [place] access response hx indicates the [facility] [address] [place], [place] [id number] [phone]

## 2015-12-04 NOTE — ED Provider Notes (Signed)
[facility]-EMERGENCY DEPT Provider Note   CSN: [id number] Arrival date & time: 12/04/15  1137  By signing my name below, I, Aggie MoatsJenny Song, attest that this documentation has been prepared under the direction and in the presence of Langston MaskerKaren , [place]. Electronically signed by: Aggie MoatsJenny Song, ED Scribe. 12/04/15. 2:41 PM.    History   Chief Complaint Chief Complaint  Patient presents with  . Allergic Reaction  . Urticaria     The history is provided by the patient. No language interpreter was used.  Urticaria    HPI Comments:  Holly Brown is a 30 y.o. female who presents to the Emergency Department complaining of hives, which started this morning. Pt reports that she woke up with hives localized all over body. Associated symptoms include warmth to affected areas and itching. Pt took 25 mg of Benadryl this morning. Denies fever. Pt has not had contact with new foods, medications or detergents.   Past Medical History:  Diagnosis Date  . Medical history non-contributory   . No pertinent past medical history   . Seizures [facility](HCC)     Patient Active Problem List   Diagnosis Date Noted  . Family hx of colon cancer 10/10/2015  . Syncope 07/04/2014  . Heart palpitations 07/04/2014  . Chest pain 07/04/2014  . Contraception management 01/18/2013    Past Surgical History:  Procedure Laterality Date  . DILATION AND CURETTAGE OF UTERUS    . WISDOM TOOTH EXTRACTION  2011    OB History    Gravida Para Term Preterm AB Living   4 3 3   1 3    SAB TAB Ectopic Multiple Live Births   1       3       Home Medications    Prior to Admission medications   Medication Sig Start Date End Date Taking? Authorizing Provider  acetaminophen (TYLENOL) 325 MG tablet Take 650 mg by mouth every 6 (six) hours as needed for moderate pain. Reported on 10/10/2015    Historical Provider, MD  etonogestrel-ethinyl estradiol (NUVARING) 0.12-0.015 MG/24HR vaginal ring Insert vaginally and leave in place for 4  consecutive weeks, then remove and replace. 10/10/15   Rachelle A Denney, CNM  ibuprofen (ADVIL,MOTRIN) 200 MG tablet Take 400 mg by mouth every 6 (six) hours as needed for moderate pain. Reported on 10/10/2015    Historical Provider, MD  levETIRAcetam (KEPPRA XR) 500 MG 24 hr tablet Take 2 tablets (1,000 mg total) by mouth at bedtime. 03/23/15   Suanne MarkerVikram R Penumalli, MD  naproxen sodium (ANAPROX) 220 MG tablet Take 660 mg by mouth as needed (pain). Reported on 10/10/2015    Historical Provider, MD  rizatriptan (MAXALT-MLT) 10 MG disintegrating tablet Take 1 tablet (10 mg total) by mouth as needed for migraine. May repeat in 2 hours if needed Patient not taking: Reported on 10/10/2015 03/23/15   Suanne MarkerVikram R Penumalli, MD  topiramate (TOPAMAX) 50 MG tablet Take 1 tablet (50 mg total) by mouth 2 (two) times daily. Patient not taking: Reported on 10/10/2015 03/23/15   Suanne MarkerVikram R Penumalli, MD    Family History Family History  Problem Relation Age of Onset  . Colon cancer Father   . Hypertension Maternal Grandmother   . Diabetes Maternal Grandmother   . Hypertension Paternal Grandmother   . Diabetes Mother     Social History Social History  Substance Use Topics  . Smoking status: Current Some Day Smoker    Packs/day: 0.25    Types: Cigarettes  Start date: 12/21/2011  . Smokeless tobacco: Not on file  . Alcohol use 0.0 oz/week     Comment: ocassionally      Allergies   Review of patient's allergies indicates no known allergies.   Review of Systems Review of Systems  Constitutional: Negative for fever.  Skin: Positive for rash.  All other systems reviewed and are negative.    Physical Exam Updated Vital Signs BP 130/77 (BP Location: Left Arm)   Pulse 104   Temp 98.8 F (37.1 C) (Oral)   Resp 16   Ht 5\' 10"  (1.778 m)   Wt 220 lb (99.8 kg)   LMP 10/09/2015 Comment: nuvaring  SpO2 100%   BMI 31.57 kg/m   Physical Exam  Constitutional: She appears well-developed and well-nourished.   HENT:  Head: Normocephalic and atraumatic.  Eyes: Conjunctivae are normal. Right eye exhibits no discharge. Left eye exhibits no discharge.  Pulmonary/Chest: Effort normal. No respiratory distress.  Neurological: She is alert. Coordination normal.  Skin: Skin is warm and dry. Rash noted. She is not diaphoretic. No erythema.  Large diffuse hive like areas, full body.   Psychiatric: She has a normal mood and affect.  Nursing note and vitals reviewed.    ED Treatments / Results  DIAGNOSTIC STUDIES:  Oxygen Saturation is 100% on room air, normal by my interpretation.    COORDINATION OF CARE:  12:18 PM Discussed treatment plan with pt at bedside, which includes IV Solumedrol, IV Benadryl and IV Pepcid and observe, and pt has agreed to plan.  Labs (all labs ordered are listed, but only abnormal results are displayed) Labs Reviewed - No data to display  EKG  EKG Interpretation None       Radiology No results found.  Procedures Procedures (including critical care time)  Medications Ordered in ED Medications - No data to display   Initial Impression / Assessment and Plan / ED Course  I have reviewed the triage vital signs and the nursing notes.  Pertinent labs & imaging results that were available during my care of the patient were reviewed by me and considered in my medical decision making (see chart for details).  Clinical Course    MDM Pt was re-examined. Decreased redness, no throat swelling or SOB. Pt to be discharged with prescription for Benadryl and Pepcid. .mdm  Final Clinical Impressions(s) / ED Diagnoses   Final diagnoses:  Urticaria, acute    New Prescriptions New Prescriptions   DIPHENHYDRAMINE (BENADRYL) 25 MG CAPSULE    Take 1 capsule (25 mg total) by mouth every 6 (six) hours as needed.   FAMOTIDINE (PEPCID) 20 MG TABLET    Take 1 tablet (20 mg total) by mouth 2 (two) times daily.    I personally performed the services in this  documentation, which was scribed in my presence.  The recorded information has been reviewed and considered.   Barnet PallKaren SofiaPAC.    Lonia SkinnerLeslie K KingvaleSofia, PA-C 12/04/15 1445    Alvira MondayErin Schlossman, MD 12/06/15 [phone]

## 2015-12-04 NOTE — ED Notes (Signed)
PT DISCHARGED. INSTRUCTIONS AND PRESCRIPTIONS GIVEN. AAOX4. PT IN NO APPARENT DISTRESS OR PAIN. THE OPPORTUNITY TO ASK QUESTIONS WAS PROVIDED. 

## 2015-12-13 ENCOUNTER — Ambulatory Visit: Payer: Medicaid Other | Admitting: Internal Medicine

## 2015-12-18 ENCOUNTER — Other Ambulatory Visit: Payer: Self-pay | Admitting: Obstetrics

## 2015-12-18 ENCOUNTER — Ambulatory Visit (INDEPENDENT_AMBULATORY_CARE_PROVIDER_SITE_OTHER): Payer: Medicaid Other | Admitting: Obstetrics

## 2015-12-18 ENCOUNTER — Encounter: Payer: Self-pay | Admitting: Obstetrics

## 2015-12-18 VITALS — BP 121/80 | HR 88 | Temp 99.5°F | Wt 215.8 lb

## 2015-12-18 DIAGNOSIS — Z8742 Personal history of other diseases of the female genital tract: Secondary | ICD-10-CM

## 2015-12-18 DIAGNOSIS — R87613 High grade squamous intraepithelial lesion on cytologic smear of cervix (HGSIL): Secondary | ICD-10-CM

## 2015-12-18 DIAGNOSIS — Z01818 Encounter for other preprocedural examination: Secondary | ICD-10-CM

## 2015-12-18 DIAGNOSIS — Z3202 Encounter for pregnancy test, result negative: Secondary | ICD-10-CM

## 2015-12-18 LAB — POCT URINE PREGNANCY: Preg Test, Ur: NEGATIVE

## 2015-12-18 NOTE — Progress Notes (Signed)
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 The prior pap showed no abnormalities.  Prior cervical/vaginal disease: Trichomoniasis / BV. Prior cervical treatment: no treatment.  Procedure Details  The risks and benefits of the procedure and Written informed consent obtained.  A time-out was performed confirming the patient, procedure and allergy status  Speculum placed in vagina and excellent visualization of cervix achieved, cervix swabbed x 3 with acetic acid solution.  Findings: Cervix: no visible lesions, no mosaicism, no punctation, no abnormal vasculature and acetowhite lesion(s) noted at 1 o'clock; SCJ visualized 360 degrees without lesions, endocervical curettage performed, cervical biopsies taken at 1 and 6 o'clock, specimen labelled and sent to pathology and hemostasis achieved with silver nitrate.   Vaginal inspection: normal without visible lesions. Vulvar colposcopy: vulvar colposcopy not performed.   Physical Exam   Specimens: ECC and Cervical Biopsies  Complications: none.  Plan: Specimens labelled and sent to Pathology. Will base further treatment on Pathology findings. Post biopsy instructions given to patient. Return to discuss Pathology results in 2 weeks.

## 2015-12-27 LAB — NUSWAB VG+, CANDIDA 6SP
CANDIDA ALBICANS, NAA: NEGATIVE
CANDIDA GLABRATA, NAA: NEGATIVE
CANDIDA KRUSEI, NAA: NEGATIVE
CANDIDA LUSITANIAE, NAA: NEGATIVE
CANDIDA PARAPSILOSIS, NAA: NEGATIVE
CANDIDA TROPICALIS, NAA: NEGATIVE
CHLAMYDIA TRACHOMATIS, NAA: NEGATIVE
Neisseria gonorrhoeae, NAA: NEGATIVE
TRICH VAG BY NAA: POSITIVE — AB

## 2015-12-28 ENCOUNTER — Telehealth: Payer: Self-pay | Admitting: Certified Nurse Midwife

## 2015-12-28 ENCOUNTER — Other Ambulatory Visit: Payer: Self-pay | Admitting: Obstetrics

## 2015-12-28 DIAGNOSIS — A599 Trichomoniasis, unspecified: Secondary | ICD-10-CM

## 2015-12-28 MED ORDER — METRONIDAZOLE 500 MG PO TABS: 2000.0000 mg | ORAL_TABLET | Freq: Once | ORAL | 0 refills | Status: AC

## 2015-12-28 NOTE — Telephone Encounter (Signed)
Patient called and stated that needs someone to call her back to help her understand her test results.

## 2015-12-31 NOTE — Progress Notes (Signed)
Pt. Appt. 01/08/16

## 2016-01-02 ENCOUNTER — Telehealth: Payer: Self-pay | Admitting: *Deleted

## 2016-01-02 NOTE — Telephone Encounter (Signed)
Phone message left to call for lab results. 

## 2016-01-03 ENCOUNTER — Telehealth: Payer: Self-pay | Admitting: *Deleted

## 2016-01-03 NOTE — Telephone Encounter (Signed)
Test results explained to patient as seen in my chart.Patient made aware needs to consult with Dr Clearance CootsHarper about having a Cryo done.A TOC needs to be done 4-6 weeks after completion of Antibiotic treatment.

## 2016-01-07 ENCOUNTER — Encounter: Payer: Self-pay | Admitting: *Deleted

## 2016-01-08 ENCOUNTER — Ambulatory Visit: Payer: Self-pay | Admitting: Obstetrics

## 2016-01-18 ENCOUNTER — Ambulatory Visit: Payer: Medicaid Other | Admitting: Obstetrics

## 2016-01-22 ENCOUNTER — Encounter (HOSPITAL_COMMUNITY): Payer: Self-pay | Admitting: Emergency Medicine

## 2016-01-22 ENCOUNTER — Emergency Department (HOSPITAL_COMMUNITY)
Admission: EM | Admit: 2016-01-22 | Discharge: 2016-01-22 | Disposition: A | Discharge status: Home / Self Care | Payer: Medicaid Other | Attending: Emergency Medicine | Admitting: Emergency Medicine

## 2016-01-22 DIAGNOSIS — J069 Acute upper respiratory infection, unspecified: Secondary | ICD-10-CM | POA: Diagnosis not present

## 2016-01-22 DIAGNOSIS — F17210 Nicotine dependence, cigarettes, uncomplicated: Secondary | ICD-10-CM | POA: Diagnosis not present

## 2016-01-22 DIAGNOSIS — Z79899 Other long term (current) drug therapy: Secondary | ICD-10-CM | POA: Insufficient documentation

## 2016-01-22 DIAGNOSIS — R05 Cough: Secondary | ICD-10-CM | POA: Diagnosis present

## 2016-01-22 LAB — RAPID STREP SCREEN (MED CTR MEBANE ONLY): Streptococcus, Group A Screen (Direct): NEGATIVE

## 2016-01-22 MED ORDER — BENZONATATE 100 MG PO CAPS: 100.0000 mg | ORAL_CAPSULE | Freq: Every evening | ORAL | 0 refills | Status: DC | PRN

## 2016-01-22 MED ORDER — KETOROLAC TROMETHAMINE 60 MG/2ML IM SOLN
60.0000 mg | Freq: Once | INTRAMUSCULAR | Status: AC
  Administered 2016-01-22: 60 mg via INTRAMUSCULAR
  Filled 2016-01-22: qty 2

## 2016-01-22 MED ORDER — IBUPROFEN 100 MG PO TABS: 800.0000 mg | ORAL_TABLET | Freq: Three times a day (TID) | ORAL | 0 refills | Status: DC | PRN

## 2016-01-22 MED ORDER — GUAIFENESIN-DM 100-10 MG/5ML PO SYRP: 5.0000 mL | ORAL_SOLUTION | ORAL | 0 refills | Status: DC | PRN

## 2016-01-22 NOTE — ED Provider Notes (Signed)
[facility] DEPT Provider Note   CSN: [id number] Arrival date & time: 01/22/16  [id number]     History   Chief Complaint Chief Complaint  Patient presents with  . Sore Throat  . Cough    HPI Holly Brown is a 30 y.o. female.  HPI   Pt has been sick for four days with chills, bodyaches, nasal congestion, cough productive of yellow sputum.  Has been taking theraflu, cough drops, hot lemon juice and honey without improvement.  Denies CP, SOB.    Past Medical History:  Diagnosis Date  . Medical history non-contributory   . No pertinent past medical history   . Seizures [facility](HCC)     Patient Active Problem List   Diagnosis Date Noted  . Family hx of colon cancer 10/10/2015  . Syncope 07/04/2014  . Heart palpitations 07/04/2014  . Chest pain 07/04/2014  . Contraception management 01/18/2013    Past Surgical History:  Procedure Laterality Date  . DILATION AND CURETTAGE OF UTERUS    . WISDOM TOOTH EXTRACTION  2011    OB History    Gravida Para Term Preterm AB Living   4 3 3   1 3    SAB TAB Ectopic Multiple Live Births   1       3       Home Medications    Prior to Admission medications   Medication Sig Start Date End Date Taking? Authorizing Provider  acetaminophen (TYLENOL) 325 MG tablet Take 650 mg by mouth every 6 (six) hours as needed for moderate pain. Reported on 10/10/2015    Historical Provider, MD  benzonatate (TESSALON) 100 MG capsule Take 1 capsule (100 mg total) by mouth at bedtime as needed for cough. 01/22/16   Trixie DredgeEmily , PA-C  etonogestrel-ethinyl estradiol (NUVARING) 0.12-0.015 MG/24HR vaginal ring Insert vaginally and leave in place for 4 consecutive weeks, then remove and replace. 10/10/15   Rachelle A Denney, CNM  famotidine (PEPCID) 20 MG tablet Take 1 tablet (20 mg total) by mouth 2 (two) times daily. 12/04/15   Elson AreasLeslie K Sofia, PA-C  guaiFENesin-dextromethorphan (ROBITUSSIN DM) 100-10 MG/5ML syrup Take 5 mLs by mouth every 4 (four) hours as  needed for cough (and congestion). 01/22/16   Trixie DredgeEmily , PA-C  ibuprofen (MOTRIN JUNIOR STRENGTH) 100 MG tablet Take 8 tablets (800 mg total) by mouth every 8 (eight) hours as needed for pain or fever. 01/22/16   Trixie DredgeEmily , PA-C  levETIRAcetam (KEPPRA XR) 500 MG 24 hr tablet Take 2 tablets (1,000 mg total) by mouth at bedtime. 03/23/15   Suanne MarkerVikram R Penumalli, MD  rizatriptan (MAXALT-MLT) 10 MG disintegrating tablet Take 1 tablet (10 mg total) by mouth as needed for migraine. May repeat in 2 hours if needed 03/23/15   Suanne MarkerVikram R Penumalli, MD    Family History Family History  Problem Relation Age of Onset  . Colon cancer Father   . Hypertension Maternal Grandmother   . Diabetes Maternal Grandmother   . Hypertension Paternal Grandmother   . Diabetes Mother     Social History Social History  Substance Use Topics  . Smoking status: Current Some Day Smoker    Packs/day: 0.25    Types: Cigarettes    Start date: 12/21/2011  . Smokeless tobacco: Current User     Comment: patient is trying different methods of quitting  . Alcohol use 0.0 oz/week     Comment: ocassionally      Allergies   Review of patient's allergies indicates no known allergies.  Review of Systems Review of Systems  All other systems reviewed and are negative.    Physical Exam Updated Vital Signs BP 125/77 (BP Location: Right Arm)   Pulse 83   Temp 98.7 F (37.1 C) (Oral)   Resp 16   LMP 12/23/2015   SpO2 98%   Physical Exam  Constitutional: She appears well-developed and well-nourished. No distress.  HENT:  Head: Normocephalic and atraumatic.  Mouth/Throat: Uvula is midline. No trismus in the jaw. Posterior oropharyngeal erythema present. No oropharyngeal exudate, posterior oropharyngeal edema or tonsillar abscesses.  Eyes: Conjunctivae are normal.  Neck: Neck supple.  Cardiovascular: Normal rate and regular rhythm.   Pulmonary/Chest: Effort normal and breath sounds normal. No respiratory distress. She has  no wheezes. She has no rales.  Neurological: She is alert.  Skin: She is not diaphoretic.  Nursing note and vitals reviewed.    ED Treatments / Results  Labs (all labs ordered are listed, but only abnormal results are displayed) Labs Reviewed  RAPID STREP SCREEN (NOT AT [facility])  CULTURE, GROUP A STREP [facility])    EKG  EKG Interpretation None       Radiology No results found.  Procedures Procedures (including critical care time)  Medications Ordered in ED Medications  ketorolac (TORADOL) injection 60 mg (60 mg Intramuscular Given 01/22/16 1039)     Initial Impression / Assessment and Plan / ED Course  I have reviewed the triage vital signs and the nursing notes.  Pertinent labs & imaging results that were available during my care of the patient were reviewed by me and considered in my medical decision making (see chart for details).  Clinical Course    Afebrile, nontoxic patient with constellation of symptoms suggestive of viral syndrome.  No concerning findings on exam.  Discharged home with supportive care, PCP follow up.  Discussed result, findings, treatment, and follow up  with patient.  Pt given return precautions.  Pt verbalizes understanding and agrees with plan.      Final Clinical Impressions(s) / ED Diagnoses   Final diagnoses:  Viral upper respiratory tract infection    New Prescriptions Discharge Medication List as of 01/22/2016 10:39 AM    START taking these medications   Details  benzonatate (TESSALON) 100 MG capsule Take 1 capsule (100 mg total) by mouth at bedtime as needed for cough., Starting Tue 01/22/2016, Print    guaiFENesin-dextromethorphan (ROBITUSSIN DM) 100-10 MG/5ML syrup Take 5 mLs by mouth every 4 (four) hours as needed for cough (and congestion)., Starting Tue 01/22/2016, Print         [place], PA-C 01/22/16 1502    Cathren Laine, MD 01/22/16 [phone]

## 2016-01-22 NOTE — ED Triage Notes (Addendum)
Pt c/o sudden onset sore throat, malaise, cough with yellow phlegm onset Saturday. Self treated with cough drops. Posterior pharynx erythematous, tonsils 1+ without exudate.

## 2016-01-22 NOTE — Discharge Instructions (Signed)
Read the information below.  Use the prescribed medication as directed.  Please discuss all new medications with your pharmacist.  You may return to the Emergency Department at any time for worsening condition or any new symptoms that concern you.  If you develop high fevers that do not resolve with tylenol or ibuprofen, you have difficulty swallowing or breathing, or you are unable to tolerate fluids by mouth, return to the ER for a recheck.    °

## 2016-01-24 LAB — CULTURE, GROUP A STREP (THRC)

## 2016-02-15 ENCOUNTER — Ambulatory Visit: Payer: Medicaid Other | Admitting: Internal Medicine

## 2016-02-15 ENCOUNTER — Ambulatory Visit (INDEPENDENT_AMBULATORY_CARE_PROVIDER_SITE_OTHER): Payer: Medicaid Other | Admitting: Gastroenterology

## 2016-02-15 ENCOUNTER — Encounter: Payer: Self-pay | Admitting: Gastroenterology

## 2016-02-15 VITALS — BP 100/70 | HR 80 | Ht 68.75 in | Wt 214.1 lb

## 2016-02-15 DIAGNOSIS — Z800 Family history of malignant neoplasm of digestive organs: Secondary | ICD-10-CM | POA: Diagnosis not present

## 2016-02-15 MED ORDER — NA SULFATE-K SULFATE-MG SULF 17.5-3.13-1.6 GM/177ML PO SOLN: 1.0000 | Freq: Once | ORAL | 0 refills | Status: AC

## 2016-02-15 NOTE — Patient Instructions (Addendum)
You have been scheduled for a colonoscopy. Please follow written instructions given to you at your visit today.  Please pick up your prep supplies at the pharmacy within the next 1-3 days. [facility] [place].  If you use inhalers (even only as needed), please bring them with you on the day of your procedure. Your physician has requested that you go to www.startemmi.com and enter the access code given to you at your visit today. This web site gives a general overview about your procedure. However, you should still follow specific instructions given to you by our office regarding your preparation for the procedure.

## 2016-02-15 NOTE — Progress Notes (Signed)
Error

## 2016-02-15 NOTE — Progress Notes (Addendum)
02/15/2016 SHANEEKA SCARBORO [id number] 07-06-1985   HISTORY OF PRESENT ILLNESS:  This is a pleasant 30 year old female who is new to our practice and was referred here by Orvilla Cornwall, CNM, to discuss colonoscopy.  Her father died of colon cancer when she was about 30 years old.  Not sure how old he was when he was diagnosed.  She denies any GI complaints.   Past Medical History:  Diagnosis Date  . Migraines   . Seizures (HCC)    Past Surgical History:  Procedure Laterality Date  . DILATION AND CURETTAGE OF UTERUS    . WISDOM TOOTH EXTRACTION  2011    reports that she has been smoking Cigarettes.  She started smoking about 4 years ago. She has been smoking about 0.25 packs per day. She has never used smokeless tobacco. She reports that she drinks alcohol. She reports that she does not use drugs. family history includes Colon cancer in her father; Diabetes in her maternal grandmother and mother; Hypertension in her maternal grandmother and paternal grandmother. No Known Allergies    Outpatient Encounter Prescriptions as of 02/15/2016  Medication Sig  . benzonatate (TESSALON) 100 MG capsule Take 1 capsule (100 mg total) by mouth at bedtime as needed for cough.  . famotidine (PEPCID) 20 MG tablet Take 1 tablet (20 mg total) by mouth 2 (two) times daily.  Marland Kitchen levETIRAcetam (KEPPRA XR) 500 MG 24 hr tablet Take 2 tablets (1,000 mg total) by mouth at bedtime.  . rizatriptan (MAXALT-MLT) 10 MG disintegrating tablet Take 1 tablet (10 mg total) by mouth as needed for migraine. May repeat in 2 hours if needed  . [DISCONTINUED] acetaminophen (TYLENOL) 325 MG tablet Take 650 mg by mouth every 6 (six) hours as needed for moderate pain. Reported on 10/10/2015  . [DISCONTINUED] etonogestrel-ethinyl estradiol (NUVARING) 0.12-0.015 MG/24HR vaginal ring Insert vaginally and leave in place for 4 consecutive weeks, then remove and replace.  . [DISCONTINUED] guaiFENesin-dextromethorphan (ROBITUSSIN  DM) 100-10 MG/5ML syrup Take 5 mLs by mouth every 4 (four) hours as needed for cough (and congestion).  . [DISCONTINUED] ibuprofen (MOTRIN JUNIOR STRENGTH) 100 MG tablet Take 8 tablets (800 mg total) by mouth every 8 (eight) hours as needed for pain or fever.   No facility-administered encounter medications on file as of 02/15/2016.      REVIEW OF SYSTEMS  : All other systems reviewed and negative except where noted in the History of Present Illness.   PHYSICAL EXAM: BP 100/70 (BP Location: Left Arm, Patient Position: Sitting, Cuff Size: Normal)   Pulse 80   Ht 5' 8.75" (1.746 m) Comment: height measured without shoes  Wt 214 lb 2 oz (97.1 kg)   LMP 01/26/2016   BMI 31.85 kg/m  General: Well developed black female in no acute distress Head: Normocephalic and atraumatic Eyes:  Sclerae anicteric, conjunctiva pink. Ears: Normal auditory acuity Lungs: Clear throughout to auscultation Heart: Regular rate and rhythm Abdomen: Soft, non-distended.  Normal bowel sounds.  Non-tender. Rectal:  Will be done at the time of colonoscopy. Musculoskeletal: Symmetrical with no gross deformities  Skin: No lesions on visible extremities Extremities: No edema  Neurological: Alert oriented x 4, grossly non-focal Psychological:  Alert and cooperative. Normal mood and affect  ASSESSMENT AND PLAN: -Family history of colon cancer:  Father died of colon cancer when she was about 30 years old.  Will schedule colonoscopy with Dr. Rhea Belton.  The risks, benefits, and alternatives to colonoscopy were discussed with the  patient and she consents to proceed.    CC:  Roe Coombsenney, Rachelle A, CNM  Addendum: Reviewed and agree with initial management. Beverley FiedlerJay M Pyrtle, MD

## 2016-04-24 ENCOUNTER — Ambulatory Visit (AMBULATORY_SURGERY_CENTER): Payer: Medicaid Other | Admitting: Internal Medicine

## 2016-04-24 ENCOUNTER — Encounter: Payer: Self-pay | Admitting: Internal Medicine

## 2016-04-24 VITALS — BP 114/67 | HR 71 | Temp 99.3°F | Resp 20 | Ht 68.0 in | Wt 214.0 lb

## 2016-04-24 DIAGNOSIS — Z1212 Encounter for screening for malignant neoplasm of rectum: Secondary | ICD-10-CM | POA: Diagnosis not present

## 2016-04-24 DIAGNOSIS — Z1211 Encounter for screening for malignant neoplasm of colon: Secondary | ICD-10-CM | POA: Diagnosis not present

## 2016-04-24 DIAGNOSIS — Z800 Family history of malignant neoplasm of digestive organs: Secondary | ICD-10-CM

## 2016-04-24 MED ORDER — SODIUM CHLORIDE 0.9 % IV SOLN: 500.0000 mL | INTRAVENOUS | Status: DC

## 2016-04-24 NOTE — Patient Instructions (Signed)
Discharge instructions given. Will schedule a repeat colonoscopy with pre-visit. Incomplete exam. Resume previous medications. YOU HAD AN ENDOSCOPIC PROCEDURE TODAY AT THE [facility]:   Refer to the procedure report that was given to you for any specific questions about what was found during the examination.  If the procedure report does not answer your questions, please call your gastroenterologist to clarify.  If you requested that your care partner not be given the details of your procedure findings, then the procedure report has been included in a sealed envelope for you to review at your convenience later.  YOU SHOULD EXPECT: Some feelings of bloating in the abdomen. Passage of more gas than usual.  Walking can help get rid of the air that was put into your GI tract during the procedure and reduce the bloating. If you had a lower endoscopy (such as a colonoscopy or flexible sigmoidoscopy) you may notice spotting of blood in your stool or on the toilet paper. If you underwent a bowel prep for your procedure, you may not have a normal bowel movement for a few days.  Please Note:  You might notice some irritation and congestion in your nose or some drainage.  This is from the oxygen used during your procedure.  There is no need for concern and it should clear up in a day or so.  SYMPTOMS TO REPORT IMMEDIATELY:   Following lower endoscopy (colonoscopy or flexible sigmoidoscopy):  Excessive amounts of blood in the stool  Significant tenderness or worsening of abdominal pains  Swelling of the abdomen that is new, acute  Fever of 100F or higher   For urgent or emergent issues, a gastroenterologist can be reached at any hour by calling [phone].   DIET:  We do recommend a small meal at first, but then you may proceed to your regular diet.  Drink plenty of fluids but you should avoid alcoholic beverages for 24 hours.  ACTIVITY:  You should plan to take it easy for the rest  of today and you should NOT DRIVE or use heavy machinery until tomorrow (because of the sedation medicines used during the test).    FOLLOW UP: Our staff will call the number listed on your records the next business day following your procedure to check on you and address any questions or concerns that you may have regarding the information given to you following your procedure. If we do not reach you, we will leave a message.  However, if you are feeling well and you are not experiencing any problems, there is no need to return our call.  We will assume that you have returned to your regular daily activities without incident.  If any biopsies were taken you will be contacted by phone or by letter within the next 1-3 weeks.  Please call us at [phone] if you have not heard about the biopsies in 3 weeks.    SIGNATURES/CONFIDENTIALITY: You and/or your care partner have signed paperwork which will be entered into your electronic medical record.  These signatures attest to the fact that that the information above on your After Visit Summary has been reviewed and is understood.  Full responsibility of the confidentiality of this discharge information lies with you and/or your care-partner.

## 2016-04-24 NOTE — Progress Notes (Signed)
Report given to PACU RN, vss 

## 2016-04-24 NOTE — Op Note (Signed)
[facility] Patient Name: Holly Brown Procedure Date: 04/24/2016 10:52 AM MRN: [id number] Endoscopist: Beverley Fiedler , MD Age: 31 Referring MD:  Date of Birth: 1985/10/29 Gender: Female Account #: [id number] Procedure:                Colonoscopy Indications:              Screening in patient at increased risk: Family                            history of 1st-degree relative with colorectal                            cancer before age 3 years (father at age 45), This                            is the patient's first colonoscopy Medicines:                Monitored Anesthesia Care Procedure:                Pre-Anesthesia Assessment:                           - Prior to the procedure, a History and Physical                            was performed, and patient medications and                            allergies were reviewed. The patient's tolerance of                            previous anesthesia was also reviewed. The risks                            and benefits of the procedure and the sedation                            options and risks were discussed with the patient.                            All questions were answered, and informed consent                            was obtained. Prior Anticoagulants: The patient has                            taken no previous anticoagulant or antiplatelet                            agents. ASA Grade Assessment: II - A patient with                            mild systemic disease. After reviewing the risks  and benefits, the patient was deemed in                            satisfactory condition to undergo the procedure.                           After obtaining informed consent, the colonoscope                            was passed under direct vision. Throughout the                            procedure, the patient's blood pressure, pulse, and                            oxygen saturations were  monitored continuously. The                            Model PCF-H190L [id number](SN#[id number]) scope was introduced                            through the anus with the intention of advancing to                            the cecum. The scope was advanced to the sigmoid                            colon before the procedure was aborted. Medications                            were given. The colonoscopy was performed with                            difficulty due to unsatisfactory bowel prep. The                            patient tolerated the procedure well. The quality                            of the bowel preparation was unsatisfactory. The                            rectum was photographed. Scope In: 11:10:41 AM Scope Out: 11:10:53 AM Total Procedure Duration: 0 hours 0 minutes 12 seconds  Findings:                 The digital rectal exam was normal.                           A large amount of solid stool was found in the                            rectum and in the recto-sigmoid colon, precluding  visualization.                           Retroflexion in the rectum was not performed. Complications:            No immediate complications. Estimated Blood Loss:     Estimated blood loss: none. Impression:               - Preparation of the colon was unsatisfactory.                           - Stool in the rectum and in the recto-sigmoid                            colon.                           - No specimens collected. Recommendation:           - Patient has a contact number available for                            emergencies. The signs and symptoms of potential                            delayed complications were discussed with the                            patient. Return to normal activities tomorrow.                            Written discharge instructions were provided to the                            patient.                           - Resume previous  diet.                           - Continue present medications.                           - Repeat colonoscopy with 2 day preparation at the                            next available appointment for screening purposes. Beverley Fiedler, MD 04/24/2016 11:14:22 AM This report has been signed electronically.

## 2016-04-25 ENCOUNTER — Telehealth: Payer: Self-pay | Admitting: *Deleted

## 2016-04-25 NOTE — Telephone Encounter (Signed)
No answer, message box full. Unable to leave message.

## 2016-06-13 ENCOUNTER — Ambulatory Visit (AMBULATORY_SURGERY_CENTER): Payer: Self-pay | Admitting: *Deleted

## 2016-06-13 VITALS — Ht 70.0 in | Wt 208.0 lb

## 2016-06-13 DIAGNOSIS — Z800 Family history of malignant neoplasm of digestive organs: Secondary | ICD-10-CM

## 2016-06-13 MED ORDER — NA SULFATE-K SULFATE-MG SULF 17.5-3.13-1.6 GM/177ML PO SOLN: ORAL | 0 refills | Status: DC

## 2016-06-13 NOTE — Progress Notes (Signed)
Patient denies any allergies to eggs or soy. Patient denies any problems with anesthesia/sedation. Patient denies any oxygen use at home and does not take any diet/weight loss medications. 2 day prep instructions given to patient.

## 2016-06-26 ENCOUNTER — Encounter: Payer: Medicaid Other | Admitting: Internal Medicine

## 2016-08-26 ENCOUNTER — Telehealth: Payer: Self-pay | Admitting: Internal Medicine

## 2016-08-26 ENCOUNTER — Encounter: Payer: Medicaid Other | Admitting: Internal Medicine

## 2016-09-08 NOTE — Telephone Encounter (Signed)
Patient not billed Colon NO SHOW FEE due to Insurance Medicaid. This is her 1st time.

## 2016-11-19 ENCOUNTER — Other Ambulatory Visit: Payer: Self-pay | Admitting: *Deleted

## 2016-11-19 ENCOUNTER — Other Ambulatory Visit: Payer: Self-pay | Admitting: Diagnostic Neuroimaging

## 2016-11-19 NOTE — Telephone Encounter (Signed)
Received fax form [facility] and needing a refill of generic keppra.  Pt last seen 2016.  Called and LMVM for pt to return call.    Busy signal at pharmacy.

## 2016-11-20 NOTE — Telephone Encounter (Signed)
[facility] spoke with [facility]adion. Advised him that this patient has not been seen since Dec 2016, and this office has been unable to reach her on all numbers available. He stated he has no way of knowing if refill was requested by someone or if the refill request was automatically generated. He stated that they have a note that patient needs a follow up appointment. He stated that if patient calls back, she will be told she must call and schedule a FU. He verbalized understanding of call.

## 2016-11-20 NOTE — Telephone Encounter (Signed)
LVM on mobile number requesting patient call back re: Keppra refill. Advised her she must be seen for follow up, was last seen 03/2015. Left office number. Called mother, on [place]. Mother's listed number is no longer in service.  Called patient's listed work phone, was unable to reach anyone.  [facility] phone immediately rang busy.

## 2016-12-03 ENCOUNTER — Telehealth: Payer: Self-pay | Admitting: Diagnostic Neuroimaging

## 2016-12-03 MED ORDER — LEVETIRACETAM ER 500 MG PO TB24: 1000.0000 mg | ORAL_TABLET | Freq: Every day | ORAL | 0 refills | Status: DC

## 2016-12-03 NOTE — Telephone Encounter (Signed)
LVM requesting patient call back to reschedule her FU. Advised her this RN can get her in sooner than Dec. Advised her after she reschedules, a refill will be sent in.

## 2016-12-03 NOTE — Addendum Note (Signed)
Addended by: [place] PinkHESSON,  C on: 12/03/2016 03:58 PM   Modules accepted: Orders

## 2016-12-03 NOTE — Telephone Encounter (Signed)
Patient returned call, stated she would be able to come in sooner. Rescheduled to be seen 12/17/16 and advised a refill will be sent in today. Advised her Dr Marjory LiesPenumalli must see her at least yearly to continue to refill Keppra. Advised she arrive 30 min early for FU. She verbalized understanding, appreciation.

## 2016-12-03 NOTE — Telephone Encounter (Signed)
Pt calling to see if a refill of levETIRAcetam (KEPPRA XR) 500 MG 24 hr tablet can be called in while she waits to be seen appointment set for 12-17, she is on wait list.  Pt states she has been without medication for about 3 weeks, please call.  [facility]   [facility] [place], [place] - [address] [phone] (Phone) [phone] (Fax)

## 2016-12-08 ENCOUNTER — Emergency Department (HOSPITAL_COMMUNITY): Payer: Medicaid Other

## 2016-12-08 ENCOUNTER — Emergency Department (HOSPITAL_COMMUNITY)
Admission: EM | Admit: 2016-12-08 | Discharge: 2016-12-09 | Disposition: A | Discharge status: Home / Self Care | Payer: Medicaid Other | Attending: Emergency Medicine | Admitting: Emergency Medicine

## 2016-12-08 ENCOUNTER — Encounter (HOSPITAL_COMMUNITY): Payer: Self-pay | Admitting: *Deleted

## 2016-12-08 DIAGNOSIS — F17210 Nicotine dependence, cigarettes, uncomplicated: Secondary | ICD-10-CM | POA: Diagnosis not present

## 2016-12-08 DIAGNOSIS — Z79899 Other long term (current) drug therapy: Secondary | ICD-10-CM | POA: Diagnosis not present

## 2016-12-08 DIAGNOSIS — W108XXA Fall (on) (from) other stairs and steps, initial encounter: Secondary | ICD-10-CM | POA: Insufficient documentation

## 2016-12-08 DIAGNOSIS — S99911A Unspecified injury of right ankle, initial encounter: Secondary | ICD-10-CM | POA: Diagnosis present

## 2016-12-08 DIAGNOSIS — Y92008 Other place in unspecified non-institutional (private) residence as the place of occurrence of the external cause: Secondary | ICD-10-CM | POA: Diagnosis not present

## 2016-12-08 DIAGNOSIS — Y999 Unspecified external cause status: Secondary | ICD-10-CM | POA: Diagnosis not present

## 2016-12-08 DIAGNOSIS — Y939 Activity, unspecified: Secondary | ICD-10-CM | POA: Insufficient documentation

## 2016-12-08 DIAGNOSIS — S93401A Sprain of unspecified ligament of right ankle, initial encounter: Secondary | ICD-10-CM | POA: Diagnosis not present

## 2016-12-08 MED ORDER — ACETAMINOPHEN 500 MG PO TABS
1000.0000 mg | ORAL_TABLET | Freq: Once | ORAL | Status: AC
  Administered 2016-12-08: 1000 mg via ORAL
  Filled 2016-12-08: qty 2

## 2016-12-08 NOTE — ED Provider Notes (Signed)
[facility]-EMERGENCY DEPT Provider Note   CSN: [id number] Arrival date & time: 12/08/16  2158     History   Chief Complaint Chief Complaint  Patient presents with  . Fall    HPI Holly Brown is a 31 y.o. female with a hx of migraines, seizures (on keppra) presents to the Emergency Department complaining of acute, persistent, right ankle pain onset around 7pm when she missed the last step at her home and inverted her ankle, causing immediate pain.  Pt denies falling or hitting her head.  She reports she has been able to walk with pain.  She denies treatments PTA.  Movement and palpation make the pain worse.  Nothing makes it better.  Pt denies numbness, tingling, weakness, open wounds.     The history is provided by the patient and medical records. No language interpreter was used.    Past Medical History:  Diagnosis Date  . Migraines   . Seizures (HCC)    Jan.2017    Patient Active Problem List   Diagnosis Date Noted  . Family history of colon cancer 10/10/2015  . Syncope 07/04/2014  . Heart palpitations 07/04/2014  . Chest pain 07/04/2014  . Contraception management 01/18/2013    Past Surgical History:  Procedure Laterality Date  . DILATION AND CURETTAGE OF UTERUS    . WISDOM TOOTH EXTRACTION  2011    OB History    Gravida Para Term Preterm AB Living   4 3 3   1 3    SAB TAB Ectopic Multiple Live Births   1       3       Home Medications    Prior to Admission medications   Medication Sig Start Date End Date Taking? Authorizing Provider  benzonatate (TESSALON) 100 MG capsule Take 1 capsule (100 mg total) by mouth at bedtime as needed for cough. 01/22/16   Trixie Dredge, PA-C  famotidine (PEPCID) 20 MG tablet Take 1 tablet (20 mg total) by mouth 2 (two) times daily. 12/04/15   Elson Areas, PA-C  levETIRAcetam (KEPPRA XR) 500 MG 24 hr tablet Take 2 tablets (1,000 mg total) by mouth at bedtime. 12/03/16   Penumalli, Glenford Bayley, MD  Na Sulfate-K Sulfate-Mg Sulf  17.5-3.13-1.6 GM/180ML SOLN Suprep (no substitutions)-TAKE AS DIRECTED. 06/13/16   Pyrtle, Carie Caddy, MD  rizatriptan (MAXALT-MLT) 10 MG disintegrating tablet Take 1 tablet (10 mg total) by mouth as needed for migraine. May repeat in 2 hours if needed 03/23/15   Penumalli, Glenford Bayley, MD    Family History Family History  Problem Relation Age of Onset  . Colon cancer Father   . Hypertension Maternal Grandmother   . Diabetes Maternal Grandmother   . Hypertension Paternal Grandmother   . Diabetes Mother     Social History Social History  Substance Use Topics  . Smoking status: Current Some Day Smoker    Packs/day: 0.25    Types: Cigarettes    Start date: 12/21/2011  . Smokeless tobacco: Never Used     Comment: patient is trying different methods of quitting  . Alcohol use 0.0 oz/week     Comment: ocassionally      Allergies   Patient has no known allergies.   Review of Systems Review of Systems  Constitutional: Negative for chills and fever.  Gastrointestinal: Negative for nausea and vomiting.  Musculoskeletal: Positive for arthralgias and joint swelling. Negative for back pain, neck pain and neck stiffness.  Skin: Negative for wound.  Neurological: Negative for  numbness.  Hematological: Does not bruise/bleed easily.  Psychiatric/Behavioral: The patient is not nervous/anxious.   All other systems reviewed and are negative.    Physical Exam Updated Vital Signs BP 108/67 (BP Location: Right Arm)   Pulse 87   Temp 98.2 F (36.8 C) (Oral)   Resp 18   Ht 5\' 11"  (1.803 m)   Wt 97.5 kg (215 lb)   LMP 11/24/2016 (Approximate)   SpO2 99%   BMI 29.99 kg/m   Physical Exam  Constitutional: She appears well-developed and well-nourished. No distress.  HENT:  Head: Normocephalic and atraumatic.  Eyes: Conjunctivae are normal.  Neck: Normal range of motion.  Cardiovascular: Normal rate, regular rhythm and intact distal pulses.   Capillary refill < 3 sec  Pulmonary/Chest: Effort  normal.  Musculoskeletal: She exhibits tenderness. She exhibits no edema.  Right ankle:  Decreased ROM due to pain.  Minimal swelling to the ankle with TTP along the medial joint line.  No TTP along the lateral joint line.  No palpable defect to the achilles tendon. No open wounds.  Neurological: She is alert. Coordination normal.  Sensation intact to normal touch Strength 4/5 with plantar flexion, dorsiflexion of the right ankle due to pain.  5/5 strength in the right great toe, knee and hip.  Skin: Skin is warm and dry. She is not diaphoretic.  No tenting of the skin  Psychiatric: She has a normal mood and affect.  Nursing note and vitals reviewed.    ED Treatments / Results   Radiology Dg Ankle Complete Right  Result Date: 12/08/2016 CLINICAL DATA:  Right ankle pain after twisting injury walking down steps. Pain and swelling laterally. EXAM: RIGHT ANKLE - COMPLETE 3+ VIEW COMPARISON:  None. FINDINGS: There is no evidence of fracture, dislocation, or joint effusion. There is no evidence of arthropathy or other focal bone abnormality. Soft tissues are unremarkable. IMPRESSION: Negative radiographs of the right ankle. Electronically Signed   By: Rubye Oaks M.D.   On: 12/08/2016 23:11    Procedures Procedures (including critical care time)  Medications Ordered in ED Medications  acetaminophen (TYLENOL) tablet 1,000 mg (not administered)     Initial Impression / Assessment and Plan / ED Course  I have reviewed the triage vital signs and the nursing notes.  Pertinent labs & imaging results that were available during my care of the patient were reviewed by me and considered in my medical decision making (see chart for details).     Patient X-Ray negative for obvious fracture or dislocation. Pain managed in ED. Pt advised to follow up with orthopedics if symptoms persist for possibility of missed fracture diagnosis. Patient given brace while in ED, conservative therapy  recommended and discussed. Patient will be dc home & is agreeable with above plan.  Final Clinical Impressions(s) / ED Diagnoses   Final diagnoses:  Sprain of right ankle, unspecified ligament, initial encounter    New Prescriptions New Prescriptions   No medications on file     Milta Deiters 12/08/16 2325    Shon Baton, MD 12/10/16 [phone]

## 2016-12-08 NOTE — ED Triage Notes (Signed)
Pt states she missed a step tonight hurting rt ankle and top of foot.

## 2016-12-08 NOTE — Discharge Instructions (Signed)

## 2016-12-17 ENCOUNTER — Telehealth: Payer: Self-pay | Admitting: *Deleted

## 2016-12-17 ENCOUNTER — Ambulatory Visit: Payer: Self-pay | Admitting: Diagnostic Neuroimaging

## 2016-12-17 NOTE — Telephone Encounter (Signed)
Called patient to advise she missed her follow up today. She stated she forgot. Advised her that this RN discussed with Dr Marjory Lies who stated she may follow up with [facility] for medication refills if she would prefer. She stated she has not been there in a long time; advised she call and ask about an appointment and call this RN back if she needs or wants to continue to be follow here for her seizures. Patient verbalized understanding, appreciation.

## 2016-12-18 ENCOUNTER — Encounter: Payer: Self-pay | Admitting: Diagnostic Neuroimaging

## 2017-03-16 ENCOUNTER — Other Ambulatory Visit: Payer: Self-pay

## 2017-03-16 ENCOUNTER — Emergency Department (HOSPITAL_COMMUNITY)
Admission: EM | Admit: 2017-03-16 | Discharge: 2017-03-16 | Disposition: A | Discharge status: Home / Self Care | Payer: Medicaid Other | Attending: Emergency Medicine | Admitting: Emergency Medicine

## 2017-03-16 ENCOUNTER — Encounter (HOSPITAL_COMMUNITY): Payer: Self-pay

## 2017-03-16 DIAGNOSIS — M79644 Pain in right finger(s): Secondary | ICD-10-CM | POA: Diagnosis present

## 2017-03-16 DIAGNOSIS — F17210 Nicotine dependence, cigarettes, uncomplicated: Secondary | ICD-10-CM | POA: Diagnosis not present

## 2017-03-16 DIAGNOSIS — Z79899 Other long term (current) drug therapy: Secondary | ICD-10-CM | POA: Insufficient documentation

## 2017-03-16 MED ORDER — IBUPROFEN 200 MG PO TABS
600.0000 mg | ORAL_TABLET | Freq: Once | ORAL | Status: AC
  Administered 2017-03-16: 600 mg via ORAL
  Filled 2017-03-16: qty 3

## 2017-03-16 NOTE — Discharge Instructions (Signed)
You can take Advil every 6 hours as needed for pain.   It is recommended use of the false nail to remove it.   Follow-up with your primary care if symptoms persist.

## 2017-03-16 NOTE — ED Triage Notes (Signed)
Pt states that her right index fingernail was coming off and super glue was put on it instead of nail glue, Thursday it started to burn and she says it hurts to bend her finger

## 2017-03-16 NOTE — ED Notes (Signed)
Patient right pointer finger hurts when she stretches it out. No obvious injury. No dried glue seen.

## 2017-03-16 NOTE — ED Provider Notes (Signed)
[facility]-EMERGENCY DEPT Provider Note   CSN: [id number] Arrival date & time: 03/16/17  [id number]     History   Chief Complaint Chief [id number]  Patient presents with  . Hand Pain    HPI Holly Brown is a 31 y.o. female presenting to the ED with a few days of right index finger pain.  She states her acrylic nail was falling off at the base and she applied superglue instead of nail glue on Thursday.  States the glue burned initially, and has been having some pain the end of her right finger since that time.  Pain is worse with movement.  No recent injuries.  No purulent drainage.  No fever.  The history is provided by the patient.    Past Medical History:  Diagnosis Date  . Migraines   . Seizures (HCC)    Jan.2017    Patient Active Problem List   Diagnosis Date Noted  . Family history of colon cancer 10/10/2015  . Syncope 07/04/2014  . Heart palpitations 07/04/2014  . Chest pain 07/04/2014  . Contraception management 01/18/2013    Past Surgical History:  Procedure Laterality Date  . DILATION AND CURETTAGE OF UTERUS    . WISDOM TOOTH EXTRACTION  2011    OB History    Gravida Para Term Preterm AB Living   4 3 3   1 3    SAB TAB Ectopic Multiple Live Births   1       3       Home Medications    Prior to Admission medications   Medication Sig Start Date End Date Taking? Authorizing Provider  benzonatate (TESSALON) 100 MG capsule Take 1 capsule (100 mg total) by mouth at bedtime as needed for cough. 01/22/16   Trixie DredgeWest, Emily, PA-C  famotidine (PEPCID) 20 MG tablet Take 1 tablet (20 mg total) by mouth 2 (two) times daily. 12/04/15   Elson AreasSofia, Leslie K, PA-C  levETIRAcetam (KEPPRA XR) 500 MG 24 hr tablet Take 2 tablets (1,000 mg total) by mouth at bedtime. 12/03/16   Penumalli, Glenford BayleyVikram R, MD  Na Sulfate-K Sulfate-Mg Sulf 17.5-3.13-1.6 GM/180ML SOLN Suprep (no substitutions)-TAKE AS DIRECTED. 06/13/16   Pyrtle, Carie CaddyJay M, MD  rizatriptan (MAXALT-MLT) 10 MG  disintegrating tablet Take 1 tablet (10 mg total) by mouth as needed for migraine. May repeat in 2 hours if needed 03/23/15   Penumalli, Glenford BayleyVikram R, MD    Family History Family History  Problem Relation Age of Onset  . Colon cancer Father   . Hypertension Maternal Grandmother   . Diabetes Maternal Grandmother   . Hypertension Paternal Grandmother   . Diabetes Mother     Social History Social History   Tobacco Use  . Smoking status: Current Some Day Smoker    Packs/day: 0.25    Types: Cigarettes    Start date: 12/21/2011  . Smokeless tobacco: Never Used  . Tobacco comment: patient is trying different methods of quitting  Substance Use Topics  . Alcohol use: Yes    Alcohol/week: 0.0 oz    Comment: ocassionally   . Drug use: No     Allergies   Patient has no known allergies.   Review of Systems Review of Systems  Constitutional: Negative for fever.  Musculoskeletal: Positive for myalgias.  Skin: Negative for color change.     Physical Exam Updated Vital Signs BP 117/83 (BP Location: Left Arm)   Pulse 78   Temp 97.7 F (36.5 C) (Oral)   Resp  20   Ht 5\' 10"  (1.778 m)   Wt 97.5 kg (215 lb)   LMP 02/27/2017   SpO2 99%   BMI 30.85 kg/m   Physical Exam  Constitutional: She appears well-developed and well-nourished. No distress.  HENT:  Head: Normocephalic and atraumatic.  Eyes: Conjunctivae are normal.  Cardiovascular: Normal rate and intact distal pulses.  Pulmonary/Chest: Effort normal.  Musculoskeletal:  Distal right first phalanx with tenderness, no firmness or edema noted.  No erythema.  No purulent drainage.  No paronychia.  No glue visualized.  Full normal range of motion.  Psychiatric: She has a normal mood and affect. Her behavior is normal.  Nursing note and vitals reviewed.    ED Treatments / Results  Labs (all labs ordered are listed, but only abnormal results are displayed) Labs Reviewed - No data to display  EKG  EKG Interpretation None         Radiology No results found.  Procedures Procedures (including critical care time)  Medications Ordered in ED Medications  ibuprofen (ADVIL,MOTRIN) tablet 600 mg (600 mg Oral Given 03/16/17 0236)     Initial Impression / Assessment and Plan / ED Course  I have reviewed the triage vital signs and the nursing notes.  Pertinent labs & imaging results that were available during my care of the patient were reviewed by me and considered in my medical decision making (see chart for details).     Patient with right first distal phalanx pain after applying superglue to her acrylic nail.  No evidence of infection.  No injury.  Normal range of motion.  Afebrile.  Recommend patient soak the finger to remove the nail.  Patient to follow-up with her PCP if symptoms persist.  Safe for discharge.  Discussed results, findings, treatment and follow up. Patient advised of return precautions. Patient verbalized understanding and agreed with plan.  Final Clinical Impressions(s) / ED Diagnoses   Final diagnoses:  Finger pain, right    ED Discharge Orders    None       Robinson, [place] N, PA-C 03/16/17 0244    Palumbo, April, MD 03/16/17 [id number]

## 2017-03-20 ENCOUNTER — Emergency Department (HOSPITAL_COMMUNITY)
Admission: EM | Admit: 2017-03-20 | Discharge: 2017-03-20 | Disposition: A | Discharge status: Home / Self Care | Payer: Medicaid Other | Attending: Emergency Medicine | Admitting: Emergency Medicine

## 2017-03-20 ENCOUNTER — Other Ambulatory Visit: Payer: Self-pay

## 2017-03-20 ENCOUNTER — Emergency Department (HOSPITAL_COMMUNITY)
Admission: EM | Admit: 2017-03-20 | Discharge: 2017-03-20 | Disposition: A | Discharge status: Home / Self Care | Payer: Medicaid Other | Source: Home / Self Care | Attending: Emergency Medicine | Admitting: Emergency Medicine

## 2017-03-20 ENCOUNTER — Encounter (HOSPITAL_COMMUNITY): Payer: Self-pay | Admitting: Emergency Medicine

## 2017-03-20 DIAGNOSIS — Z5321 Procedure and treatment not carried out due to patient leaving prior to being seen by health care provider: Secondary | ICD-10-CM | POA: Diagnosis not present

## 2017-03-20 DIAGNOSIS — L03011 Cellulitis of right finger: Secondary | ICD-10-CM

## 2017-03-20 DIAGNOSIS — M79643 Pain in unspecified hand: Secondary | ICD-10-CM | POA: Diagnosis present

## 2017-03-20 MED ORDER — LIDOCAINE HCL (PF) 1 % IJ SOLN
5.0000 mL | Freq: Once | INTRAMUSCULAR | Status: AC
  Administered 2017-03-20: 5 mL via INTRADERMAL
  Filled 2017-03-20: qty 5

## 2017-03-20 MED ORDER — DOXYCYCLINE HYCLATE 100 MG PO CAPS: 100.0000 mg | ORAL_CAPSULE | Freq: Two times a day (BID) | ORAL | 0 refills | Status: DC

## 2017-03-20 NOTE — ED Triage Notes (Signed)
Pt states she does not want to wait   Pt stated "If I cant get pain medication right now then I am not going to wait"

## 2017-03-20 NOTE — ED Provider Notes (Signed)
[facility] Note   CSN: [id number] Arrival date & time: 03/20/17  0242     History   Chief Complaint Chief Complaint  Patient presents with  . Finger Swelling    HPI Holly Brown is a 31 y.o. female.  Patient presents to the emergency department with a chief complaint of right index finger pain.  She reports swelling and pain around her nail and underneath her nail.  She states the symptoms started yesterday.  She denies any fevers, chills, or any injuries.  She states that she removed her false nail, but didn't have any relief of her symptoms.  She has tried taking OTC meds without relief.   The history is provided by the patient. No language interpreter was used.    Past Medical History:  Diagnosis Date  . Migraines   . Seizures (HCC)    Jan.2017    Patient Active Problem List   Diagnosis Date Noted  . Family history of colon cancer 10/10/2015  . Syncope 07/04/2014  . Heart palpitations 07/04/2014  . Chest pain 07/04/2014  . Contraception management 01/18/2013    Past Surgical History:  Procedure Laterality Date  . DILATION AND CURETTAGE OF UTERUS    . WISDOM TOOTH EXTRACTION  2011    OB History    Gravida Para Term Preterm AB Living   4 3 3   1 3    SAB TAB Ectopic Multiple Live Births   1       3       Home Medications    Prior to Admission medications   Medication Sig Start Date End Date Taking? Authorizing Provider  benzonatate (TESSALON) 100 MG capsule Take 1 capsule (100 mg total) by mouth at bedtime as needed for cough. 01/22/16   Trixie DredgeWest, Emily, PA-C  famotidine (PEPCID) 20 MG tablet Take 1 tablet (20 mg total) by mouth 2 (two) times daily. 12/04/15   Elson AreasSofia, Leslie K, PA-C  levETIRAcetam (KEPPRA XR) 500 MG 24 hr tablet Take 2 tablets (1,000 mg total) by mouth at bedtime. 12/03/16   Penumalli, Glenford BayleyVikram R, MD  Na Sulfate-K Sulfate-Mg Sulf 17.5-3.13-1.6 GM/180ML SOLN Suprep (no substitutions)-TAKE AS  DIRECTED. 06/13/16   Pyrtle, Carie CaddyJay M, MD  rizatriptan (MAXALT-MLT) 10 MG disintegrating tablet Take 1 tablet (10 mg total) by mouth as needed for migraine. May repeat in 2 hours if needed 03/23/15   Penumalli, Glenford BayleyVikram R, MD    Family History Family History  Problem Relation Age of Onset  . Colon cancer Father   . Hypertension Maternal Grandmother   . Diabetes Maternal Grandmother   . Hypertension Paternal Grandmother   . Diabetes Mother     Social History Social History   Tobacco Use  . Smoking status: Current Some Day Smoker    Packs/day: 0.25    Types: Cigarettes    Start date: 12/21/2011  . Smokeless tobacco: Never Used  . Tobacco comment: patient is trying different methods of quitting  Substance Use Topics  . Alcohol use: Yes    Alcohol/week: 0.0 oz    Comment: ocassionally   . Drug use: No     Allergies   Patient has no known allergies.   Review of Systems Review of Systems  All other systems reviewed and are negative.    Physical Exam Updated Vital Signs BP 125/63 (BP Location: Left Arm)   Pulse 84   Temp 99 F (37.2 C) (Oral)   Resp 16   Ht  5\' 10"  (1.778 m)   Wt 97.5 kg (215 lb)   LMP 02/27/2017   SpO2 100%   BMI 30.85 kg/m   Physical Exam  Constitutional: She is oriented to person, place, and time. No distress.  HENT:  Head: Normocephalic and atraumatic.  Eyes: Conjunctivae and EOM are normal. Pupils are equal, round, and reactive to light.  Neck: No tracheal deviation present.  Cardiovascular: Normal rate.  Pulmonary/Chest: Effort normal. No respiratory distress.  Abdominal: Soft.  Musculoskeletal: Normal range of motion.  Neurological: She is alert and oriented to person, place, and time.  Skin: Skin is warm and dry. She is not diaphoretic.  Subungual abscess of right index finger  Psychiatric: Judgment normal.  Nursing note and vitals reviewed.    ED Treatments / Results  Labs (all labs ordered are listed, but only abnormal results are  displayed) Labs Reviewed - No data to display  EKG  EKG Interpretation None       Radiology No results found.  Procedures Procedures (including critical care time) INCISION AND DRAINAGE Performed by: Roxy HorsemanBROWNING,  Consent: Verbal consent obtained. Risks and benefits: risks, benefits and alternatives were discussed Type: abscess  Body area: right index subungual   Anesthesia: digital block  Incision was made with a electocautery.  Local anesthetic: lidocaine 1% without epinephrine  Anesthetic total: 4 ml  Complexity: complex Blunt dissection to break up loculations  Drainage: purulent  Drainage amount: moderate  Packing material: not packed  Patient tolerance: Patient tolerated the procedure well with no immediate complications.    Medications Ordered in ED Medications  lidocaine (PF) (XYLOCAINE) 1 % injection 5 mL (not administered)     Initial Impression / Assessment and Plan / ED Course  I have reviewed the triage vital signs and the nursing notes.  Pertinent labs & imaging results that were available during my care of the patient were reviewed by me and considered in my medical decision making (see chart for details).     Patient was subungual abscess to right index finger.  This was drained using electrocautery device.  Patient had significant relief of her symptoms.  Will discharge to home with doxy.  Final Clinical Impressions(s) / ED Diagnoses   Final diagnoses:  Subungual infection of finger of right hand    ED Discharge Orders    None       Roxy HorsemanBrowning, , PA-C 03/20/17 0530    Geoffery Lyonselo, Douglas, MD 03/20/17 [phone]

## 2017-03-20 NOTE — ED Notes (Signed)
See EDP assessment 

## 2017-03-20 NOTE — ED Notes (Signed)
Pt verbalizes understanding of d/c instructions. Pt received prescriptions. Pt ambulatory at d/c with all belongings.  

## 2017-03-20 NOTE — ED Triage Notes (Signed)
Patient reports pain with mild swelling at right distal index finger onset yesterday , denies injury / no drainage .

## 2017-04-06 ENCOUNTER — Ambulatory Visit: Payer: Medicaid Other | Admitting: Diagnostic Neuroimaging

## 2017-05-13 ENCOUNTER — Other Ambulatory Visit (HOSPITAL_COMMUNITY)
Admission: RE | Admit: 2017-05-13 | Discharge: 2017-05-13 | Disposition: A | Discharge status: Home / Self Care | Payer: Medicaid Other | Source: Ambulatory Visit | Attending: Certified Nurse Midwife | Admitting: Certified Nurse Midwife

## 2017-05-13 ENCOUNTER — Encounter: Payer: Self-pay | Admitting: Certified Nurse Midwife

## 2017-05-13 ENCOUNTER — Ambulatory Visit (INDEPENDENT_AMBULATORY_CARE_PROVIDER_SITE_OTHER): Payer: Medicaid Other | Admitting: Certified Nurse Midwife

## 2017-05-13 VITALS — BP 138/79 | HR 74 | Ht 70.0 in | Wt 219.6 lb

## 2017-05-13 DIAGNOSIS — Z309 Encounter for contraceptive management, unspecified: Secondary | ICD-10-CM

## 2017-05-13 DIAGNOSIS — N760 Acute vaginitis: Secondary | ICD-10-CM

## 2017-05-13 DIAGNOSIS — Z01411 Encounter for gynecological examination (general) (routine) with abnormal findings: Secondary | ICD-10-CM | POA: Insufficient documentation

## 2017-05-13 DIAGNOSIS — Z30011 Encounter for initial prescription of contraceptive pills: Secondary | ICD-10-CM

## 2017-05-13 DIAGNOSIS — Z113 Encounter for screening for infections with a predominantly sexual mode of transmission: Secondary | ICD-10-CM

## 2017-05-13 DIAGNOSIS — B9689 Other specified bacterial agents as the cause of diseases classified elsewhere: Secondary | ICD-10-CM

## 2017-05-13 DIAGNOSIS — A5901 Trichomonal vulvovaginitis: Secondary | ICD-10-CM | POA: Insufficient documentation

## 2017-05-13 DIAGNOSIS — A5489 Other gonococcal infections: Secondary | ICD-10-CM | POA: Insufficient documentation

## 2017-05-13 DIAGNOSIS — N926 Irregular menstruation, unspecified: Secondary | ICD-10-CM

## 2017-05-13 DIAGNOSIS — Z800 Family history of malignant neoplasm of digestive organs: Secondary | ICD-10-CM

## 2017-05-13 DIAGNOSIS — Z01419 Encounter for gynecological examination (general) (routine) without abnormal findings: Secondary | ICD-10-CM

## 2017-05-13 DIAGNOSIS — Z1151 Encounter for screening for human papillomavirus (HPV): Secondary | ICD-10-CM | POA: Insufficient documentation

## 2017-05-13 DIAGNOSIS — N898 Other specified noninflammatory disorders of vagina: Secondary | ICD-10-CM

## 2017-05-13 LAB — POCT URINE PREGNANCY: Preg Test, Ur: NEGATIVE

## 2017-05-13 MED ORDER — METRONIDAZOLE 500 MG PO TABS: 500.0000 mg | ORAL_TABLET | Freq: Two times a day (BID) | ORAL | 0 refills | Status: DC

## 2017-05-13 MED ORDER — LEVONORGEST-ETH ESTRAD 91-DAY 0.15-0.03 MG PO TABS: 1.0000 | ORAL_TABLET | Freq: Every day | ORAL | 4 refills | Status: DC

## 2017-05-13 NOTE — Progress Notes (Signed)
LMP:04/26/2017 Mammogram:N/A  Last Pap:10/10/2015 HGSIL never scheduled Cryo Colonoscopy:N/A  Contraception:condoms sometimes PCP: None  STD Screening: Declines GN:FAOZHCC:heavy and irregular periods w/ clots with sharp pain in both pelvic and vaginal area per pt pt takes IBP 800mg  for relief.

## 2017-05-13 NOTE — Progress Notes (Signed)
Subjective:        Holly Brown is a 32 y.o. female here for a routine exam.  IUD was removed  04/10/15.  Started having regular periods in January, irregular previously and heavy per patient report.  Depo injections worked in the past for her to have amenorrhea, but does not want to start back on Depo d/t concerns of weight gain. Has tried [employer] in the past.  Currently smoking about 1-2X/wk.  Is trying to quit smoking.  Employed full time. Hx of abnormal pap smears, colpo perfored 12/18/15.  Will try Seasonale for contraception and period control.  Encouraged to enroll for insurance through her employer.  Aware that she needs a colonoscopy given her family history; did not have one several years ago.  Aware that she most likely needs a colpo or cryotherapy depending on her pap smear results.  Has only smoked 1 cigg/ this year (2019); encouragement given to quit.   Personal health questionnaire:  Is patient Ashkenazi Jewish, have a family history of breast and/or ovarian cancer: no Is there a family history of uterine cancer diagnosed at age < 40, gastrointestinal cancer, urinary tract cancer, family member who is a Personnel officer syndrome-associated carrier: yes, deceased from colon cancer Is the patient overweight and hypertensive, family history of diabetes, personal history of gestational diabetes, preeclampsia or PCOS: yes Is patient over 56, have PCOS,  family history of premature CHD under age 44, diabetes, smoke, have hypertension or peripheral artery disease:  yes At any time, has a partner hit, kicked or otherwise hurt or frightened you?: no Over the past 2 weeks, have you felt down, depressed or hopeless?: no Over the past 2 weeks, have you felt little interest or pleasure in doing things?:no   Gynecologic History Patient's last menstrual period was 04/26/2017. Contraception: none Last Pap: 10/10/15. Results were: abnormal; HSIL Last mammogram: n/a <40 years.   Obstetric  History OB History  Gravida Para Term Preterm AB Living  4 3 3   1 3   SAB TAB Ectopic Multiple Live Births  1       3    # Outcome Date GA Lbr Len/2nd Weight Sex Delivery Anes PTL Lv  4 Term 11/08/12 [redacted]w[redacted]d  7 lb 12.6 oz (3.532 kg) M Vag-Spont None  LIV     Birth Comments: No complications during pregnancy or delivery.  No special care nursery, went home with mother after 2 days.  3 Term     M Vag-Spont   LIV  2 Term     F Vag-Spont   LIV  1 SAB               Past Medical History:  Diagnosis Date  . Migraines   . Seizures (HCC)    Jan.2017    Past Surgical History:  Procedure Laterality Date  . DILATION AND CURETTAGE OF UTERUS    . WISDOM TOOTH EXTRACTION  2011     Current Outpatient Medications:  .  benzonatate (TESSALON) 100 MG capsule, Take 1 capsule (100 mg total) by mouth at bedtime as needed for cough. (Patient not taking: Reported on 05/13/2017), Disp: 10 capsule, Rfl: 0 .  doxycycline (VIBRAMYCIN) 100 MG capsule, Take 1 capsule (100 mg total) by mouth 2 (two) times daily. (Patient not taking: Reported on 05/13/2017), Disp: 20 capsule, Rfl: 0 .  famotidine (PEPCID) 20 MG tablet, Take 1 tablet (20 mg total) by mouth 2 (two) times daily. (Patient not taking: Reported on 05/13/2017),  Disp: 30 tablet, Rfl: 0 .  levETIRAcetam (KEPPRA XR) 500 MG 24 hr tablet, Take 2 tablets (1,000 mg total) by mouth at bedtime. (Patient not taking: Reported on 05/13/2017), Disp: 180 tablet, Rfl: 0 .  levonorgestrel-ethinyl estradiol (SEASONALE,INTROVALE,JOLESSA) 0.15-0.03 MG tablet, Take 1 tablet by mouth daily., Disp: 1 Package, Rfl: 4 .  metroNIDAZOLE (FLAGYL) 500 MG tablet, Take 1 tablet (500 mg total) by mouth 2 (two) times daily., Disp: 14 tablet, Rfl: 0 .  Na Sulfate-K Sulfate-Mg Sulf 17.5-3.13-1.6 GM/180ML SOLN, Suprep (no substitutions)-TAKE AS DIRECTED. (Patient not taking: Reported on 05/13/2017), Disp: 354 mL, Rfl: 0 .  rizatriptan (MAXALT-MLT) 10 MG disintegrating tablet, Take 1 tablet (10  mg total) by mouth as needed for migraine. May repeat in 2 hours if needed (Patient not taking: Reported on 05/13/2017), Disp: 9 tablet, Rfl: 6  Current Facility-Administered Medications:  .  0.9 %  sodium chloride infusion, 500 mL, Intravenous, Continuous, Pyrtle, Carie CaddyJay M, MD No Known Allergies  Social History   Tobacco Use  . Smoking status: Current Some Day Smoker    Packs/day: 0.25    Types: Cigarettes    Start date: 12/21/2011  . Smokeless tobacco: Never Used  . Tobacco comment: patient is trying different methods of quitting  Substance Use Topics  . Alcohol use: Yes    Alcohol/week: 0.0 oz    Comment: ocassionally     Family History  Problem Relation Age of Onset  . Colon cancer Father   . Hypertension Maternal Grandmother   . Diabetes Maternal Grandmother   . Hypertension Paternal Grandmother   . Diabetes Mother       Review of Systems  Constitutional: negative for fatigue and weight loss Respiratory: negative for cough and wheezing Cardiovascular: negative for chest pain, fatigue and palpitations Gastrointestinal: negative for abdominal pain and change in bowel habits Musculoskeletal:negative for myalgias Neurological: negative for gait problems and tremors Behavioral/Psych: negative for abusive relationship, depression Endocrine: negative for temperature intolerance    Genitourinary:negative for abnormal menstrual periods, genital lesions, hot flashes, sexual problems and vaginal discharge Integument/breast: negative for breast lump, breast tenderness, nipple discharge and skin lesion(s)    Objective:       BP 138/79   Pulse 74   Ht 5\' 10"  (1.778 m)   Wt 219 lb 9.6 oz (99.6 kg)   LMP 04/26/2017   BMI 31.51 kg/m  General:   alert  Skin:   no rash or abnormalities  Lungs:   clear to auscultation bilaterally  Heart:   regular rate and rhythm, S1, S2 normal, no murmur, click, rub or gallop  Breasts:   normal without suspicious masses, skin or nipple changes or  axillary nodes  Abdomen:  normal findings: no organomegaly, soft, non-tender and no hernia  Pelvis:  External genitalia: normal general appearance Urinary system: urethral meatus normal and bladder without fullness, nontender Vaginal: normal without tenderness, induration or masses Cervix: normal appearance, off right side wall of vagina towards the left  Adnexa: normal bimanual exam Uterus: anteverted, anteflexed and non-tender, normal size   Lab Review Urine pregnancy test Labs reviewed yes Radiologic studies reviewed no  50% of 45 min visit spent on counseling and coordination of care.   Assessment & Plan    Healthy female exam.    1. Irregular periods    - POCT urine pregnancy  2. Encounter for initial prescription of contraceptive pills    - levonorgestrel-ethinyl estradiol (SEASONALE,INTROVALE,JOLESSA) 0.15-0.03 MG tablet; Take 1 tablet by mouth daily.  Dispense:  1 Package; Refill: 4 - Cytology - PAP  3. BV (bacterial vaginosis)    - Cervicovaginal ancillary only - metroNIDAZOLE (FLAGYL) 500 MG tablet; Take 1 tablet (500 mg total) by mouth 2 (two) times daily.  Dispense: 14 tablet; Refill: 0  4. Screen for STD (sexually transmitted disease)    - Cervicovaginal ancillary only  5. Vaginal discharge    - Cervicovaginal ancillary only  6. Well woman exam    - Cytology - PAP  7. Family history of colon cancer    - Ambulatory referral to Gastroenterology   Education reviewed: calcium supplements, depression evaluation, low fat, low cholesterol diet, safe sex/STD prevention, self breast exams, skin cancer screening and weight bearing exercise. Contraception: OCP (estrogen/progesterone). Follow up in: 1 month for colposcopy or cryotherapy depending on Pap results and insurance .   Meds ordered this encounter  Medications  . levonorgestrel-ethinyl estradiol (SEASONALE,INTROVALE,JOLESSA) 0.15-0.03 MG tablet    Sig: Take 1 tablet by mouth daily.    Dispense:  1  Package    Refill:  4  . metroNIDAZOLE (FLAGYL) 500 MG tablet    Sig: Take 1 tablet (500 mg total) by mouth 2 (two) times daily.    Dispense:  14 tablet    Refill:  0   Orders Placed This Encounter  Procedures  . Ambulatory referral to Gastroenterology    Referral Priority:   Routine    Referral Type:   Consultation    Referral Reason:   Specialty Services Required    Number of Visits Requested:   1  . POCT urine pregnancy    Possible management options include: LARK  Follow up as needed.

## 2017-05-14 LAB — CERVICOVAGINAL ANCILLARY ONLY
Bacterial vaginitis: POSITIVE — AB
Candida vaginitis: NEGATIVE
Chlamydia: NEGATIVE
Neisseria Gonorrhea: POSITIVE — AB
Trichomonas: POSITIVE — AB

## 2017-05-15 ENCOUNTER — Ambulatory Visit (INDEPENDENT_AMBULATORY_CARE_PROVIDER_SITE_OTHER): Payer: Medicaid Other

## 2017-05-15 ENCOUNTER — Other Ambulatory Visit: Payer: Self-pay | Admitting: Certified Nurse Midwife

## 2017-05-15 VITALS — BP 115/77 | HR 85

## 2017-05-15 DIAGNOSIS — A549 Gonococcal infection, unspecified: Secondary | ICD-10-CM | POA: Diagnosis not present

## 2017-05-15 DIAGNOSIS — O98211 Gonorrhea complicating pregnancy, first trimester: Secondary | ICD-10-CM | POA: Insufficient documentation

## 2017-05-15 DIAGNOSIS — A599 Trichomoniasis, unspecified: Secondary | ICD-10-CM | POA: Insufficient documentation

## 2017-05-15 LAB — CYTOLOGY - PAP
Diagnosis: NEGATIVE
HPV 16/18/45 genotyping: POSITIVE — AB
HPV: DETECTED — AB

## 2017-05-15 MED ORDER — AZITHROMYCIN 250 MG PO TABS: ORAL_TABLET | ORAL | 0 refills | Status: DC

## 2017-05-15 MED ORDER — CEFTRIAXONE SODIUM 250 MG IJ SOLR
250.0000 mg | Freq: Once | INTRAMUSCULAR | Status: AC
  Administered 2017-05-15: 250 mg via INTRAMUSCULAR

## 2017-05-15 NOTE — Progress Notes (Signed)
Chart reviewed for nurse visit. Agree with plan of care. Azithromycin was sent in to pharmacy.   Pincus Largehelps, Jazma Y, DO 05/15/2017 11:38 AM

## 2017-05-15 NOTE — Progress Notes (Signed)
az

## 2017-05-15 NOTE — Progress Notes (Signed)
Pt here for rocephin inj. Inj given in Right upper outer quad. Pt tolerated well

## 2017-05-18 ENCOUNTER — Telehealth: Payer: Self-pay | Admitting: Pediatrics

## 2017-05-18 ENCOUNTER — Other Ambulatory Visit: Payer: Self-pay | Admitting: Certified Nurse Midwife

## 2017-05-18 DIAGNOSIS — R87810 Cervical high risk human papillomavirus (HPV) DNA test positive: Secondary | ICD-10-CM | POA: Insufficient documentation

## 2017-05-18 NOTE — Telephone Encounter (Signed)
Pt called office and states she was recently dx'd w/ +GC, +BVag, +Trich, and +strep throat.  She reports rec the rocephin inj and taking Zithromax 1 gm dose.  She states she is currently taking Flagyl for BVag/Trich and Amoxil for strep.  She states the flagyl is to big for her to swallow d/t throat pain.  I advised her to crush and put in chocolate pudding or apple sauce.  Use lemon/lime soda to "wash it down".  I advised her to call back if she has any questions. She voiced understanding and agreed with plan.  [facility] notified.

## 2017-06-11 ENCOUNTER — Ambulatory Visit: Payer: Medicaid Other | Admitting: Certified Nurse Midwife

## 2017-06-22 ENCOUNTER — Encounter: Payer: Medicaid Other | Admitting: Obstetrics and Gynecology

## 2017-06-22 ENCOUNTER — Ambulatory Visit: Payer: 59 | Admitting: Certified Nurse Midwife

## 2017-06-22 ENCOUNTER — Other Ambulatory Visit (HOSPITAL_COMMUNITY)
Admission: RE | Admit: 2017-06-22 | Discharge: 2017-06-22 | Disposition: A | Discharge status: Home / Self Care | Payer: 59 | Source: Ambulatory Visit | Attending: Certified Nurse Midwife | Admitting: Certified Nurse Midwife

## 2017-06-22 ENCOUNTER — Ambulatory Visit (INDEPENDENT_AMBULATORY_CARE_PROVIDER_SITE_OTHER): Payer: 59 | Admitting: Certified Nurse Midwife

## 2017-06-22 ENCOUNTER — Encounter: Payer: Self-pay | Admitting: Certified Nurse Midwife

## 2017-06-22 VITALS — BP 123/84 | HR 86 | Wt 229.0 lb

## 2017-06-22 DIAGNOSIS — F17210 Nicotine dependence, cigarettes, uncomplicated: Secondary | ICD-10-CM | POA: Insufficient documentation

## 2017-06-22 DIAGNOSIS — Z87898 Personal history of other specified conditions: Secondary | ICD-10-CM | POA: Diagnosis not present

## 2017-06-22 DIAGNOSIS — Z8742 Personal history of other diseases of the female genital tract: Secondary | ICD-10-CM

## 2017-06-22 DIAGNOSIS — Z30011 Encounter for initial prescription of contraceptive pills: Secondary | ICD-10-CM | POA: Diagnosis not present

## 2017-06-22 DIAGNOSIS — N898 Other specified noninflammatory disorders of vagina: Secondary | ICD-10-CM

## 2017-06-22 DIAGNOSIS — Z3202 Encounter for pregnancy test, result negative: Secondary | ICD-10-CM

## 2017-06-22 DIAGNOSIS — Z23 Encounter for immunization: Secondary | ICD-10-CM | POA: Diagnosis not present

## 2017-06-22 DIAGNOSIS — A549 Gonococcal infection, unspecified: Secondary | ICD-10-CM

## 2017-06-22 DIAGNOSIS — R87810 Cervical high risk human papillomavirus (HPV) DNA test positive: Secondary | ICD-10-CM | POA: Diagnosis not present

## 2017-06-22 LAB — POCT URINE PREGNANCY: Preg Test, Ur: NEGATIVE

## 2017-06-22 MED ORDER — LEVONORGEST-ETH ESTRAD 91-DAY 0.15-0.03 MG PO TABS: 1.0000 | ORAL_TABLET | Freq: Every day | ORAL | 4 refills | Status: DC

## 2017-06-22 NOTE — Progress Notes (Signed)
C/O: sharp pains while on cycle.   Has not been able to pick up BCP's.  Gardasil given in Lt Deltoid w/no problems  Pt waited 15 mins to assure no reaction  Office supplied

## 2017-06-22 NOTE — Progress Notes (Signed)
Patient ID: Holly Brown, female   DOB: 02/04/1986, 32 y.o.   MRN: [id number]  Chief Complaint  Patient presents with  . Follow-up    TOC + Trich/Gonorrhea     HPI Holly Brown is a 32 y.o. female.  Here for f/u on Gonorrhea and Trichomonas that was positive on 05/13/17.  Has not been taking OCPs because she was not able to pick them up from the pharmacy.  UPT in office today.  Gardasil series started for +HPV on pap smear in January. Educated on yearly pap smears and the importance.     HPI  Past Medical History:  Diagnosis Date  . Migraines   . Seizures (HCC)    Jan.2017    Past Surgical History:  Procedure Laterality Date  . DILATION AND CURETTAGE OF UTERUS    . WISDOM TOOTH EXTRACTION  2011    Family History  Problem Relation Age of Onset  . Colon cancer Father   . Hypertension Maternal Grandmother   . Diabetes Maternal Grandmother   . Hypertension Paternal Grandmother   . Diabetes Mother     Social History Social History   Tobacco Use  . Smoking status: Current Some Day Smoker    Packs/day: 0.25    Types: Cigarettes    Start date: 12/21/2011  . Smokeless tobacco: Never Used  . Tobacco comment: patient is trying different methods of quitting  Substance Use Topics  . Alcohol use: Yes    Alcohol/week: 0.0 oz    Comment: ocassionally   . Drug use: No    No Known Allergies  Current Outpatient Medications  Medication Sig Dispense Refill  . azithromycin (ZITHROMAX) 250 MG tablet Take 4 tablets all together now. (Patient not taking: Reported on 06/22/2017) 4 tablet 0  . levonorgestrel-ethinyl estradiol (SEASONALE,INTROVALE,JOLESSA) 0.15-0.03 MG tablet Take 1 tablet by mouth daily. 1 Package 4  . metroNIDAZOLE (FLAGYL) 500 MG tablet Take 1 tablet (500 mg total) by mouth 2 (two) times daily. (Patient not taking: Reported on 06/22/2017) 14 tablet 0   Current Facility-Administered Medications  Medication Dose Route Frequency Provider Last Rate Last Dose  . 0.9 %   sodium chloride infusion  500 mL Intravenous Continuous Pyrtle, Carie CaddyJay M, MD        Review of Systems Review of Systems Constitutional: negative for fatigue and weight loss Respiratory: negative for cough and wheezing Cardiovascular: negative for chest pain, fatigue and palpitations Gastrointestinal: negative for abdominal pain and change in bowel habits Genitourinary:negative Integument/breast: negative for nipple discharge Musculoskeletal:negative for myalgias Neurological: negative for gait problems and tremors Behavioral/Psych: negative for abusive relationship, depression Endocrine: negative for temperature intolerance      Blood pressure 123/84, pulse 86, weight 229 lb (103.9 kg), last menstrual period 06/16/2017.  Physical Exam Physical Exam General:   alert  Skin:   no rash or abnormalities  Lungs:   clear to auscultation bilaterally  Heart:   regular rate and rhythm, S1, S2 normal, no murmur, click, rub or gallop  Breasts:   deferred  Abdomen:  normal findings: no organomegaly, soft, non-tender and no hernia  Pelvis:  External genitalia: normal general appearance Urinary system: urethral meatus normal and bladder without fullness, nontender Vaginal: normal without tenderness, induration or masses Cervix: no CMT Adnexa: normal bimanual exam Uterus: anteverted and non-tender, normal size    50% of 15 min visit spent on counseling and coordination of care.   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   Plan    Orders Placed This Encounter  Procedures  . HPV 9-valent vaccine,Recombinat  . POCT urine pregnancy   Meds ordered this encounter  Medications  . levonorgestrel-ethinyl estradiol (SEASONALE,INTROVALE,JOLESSA) 0.15-0.03 MG tablet    Sig: Take 1 tablet by mouth daily.    Dispense:  1 Package    Refill:  4     Follow up as needed.

## 2017-06-23 LAB — CERVICOVAGINAL ANCILLARY ONLY
BACTERIAL VAGINITIS: POSITIVE — AB
Chlamydia: NEGATIVE
Neisseria Gonorrhea: NEGATIVE
TRICH (WINDOWPATH): NEGATIVE

## 2017-06-24 ENCOUNTER — Other Ambulatory Visit: Payer: Self-pay | Admitting: Certified Nurse Midwife

## 2017-06-24 DIAGNOSIS — B9689 Other specified bacterial agents as the cause of diseases classified elsewhere: Secondary | ICD-10-CM

## 2017-06-24 DIAGNOSIS — N760 Acute vaginitis: Principal | ICD-10-CM

## 2017-06-24 MED ORDER — TINIDAZOLE 500 MG PO TABS: 2.0000 g | ORAL_TABLET | Freq: Every day | ORAL | 0 refills | Status: DC

## 2017-07-09 ENCOUNTER — Encounter: Payer: Self-pay | Admitting: Neurology

## 2017-07-09 ENCOUNTER — Ambulatory Visit: Payer: Self-pay | Admitting: Neurology

## 2017-08-10 ENCOUNTER — Other Ambulatory Visit: Payer: Self-pay | Admitting: *Deleted

## 2017-08-10 MED ORDER — ETONOGESTREL-ETHINYL ESTRADIOL 0.12-0.015 MG/24HR VA RING: VAGINAL_RING | VAGINAL | 12 refills | Status: DC

## 2017-08-10 NOTE — Progress Notes (Signed)
Pt called to office with [facility] with current birth control. Pt request to change back to [employer]. Per R.Denney, CNM- may change back to [employer]. Rx has been sent to pharmacy.

## 2017-08-19 ENCOUNTER — Ambulatory Visit (INDEPENDENT_AMBULATORY_CARE_PROVIDER_SITE_OTHER): Payer: 59

## 2017-08-19 DIAGNOSIS — Z23 Encounter for immunization: Secondary | ICD-10-CM | POA: Diagnosis not present

## 2017-08-19 NOTE — Progress Notes (Signed)
Pt here for her 2nd gardasil inj. Inj given in left deltoid. Pt tolerated well. Pt advised to schedule nurse visit for 4 months out for her last gardasil inj.

## 2017-11-04 ENCOUNTER — Ambulatory Visit (INDEPENDENT_AMBULATORY_CARE_PROVIDER_SITE_OTHER): Payer: 59 | Admitting: Obstetrics

## 2017-11-04 ENCOUNTER — Encounter: Payer: Self-pay | Admitting: Obstetrics

## 2017-11-04 ENCOUNTER — Other Ambulatory Visit (HOSPITAL_COMMUNITY)
Admission: RE | Admit: 2017-11-04 | Discharge: 2017-11-04 | Disposition: A | Discharge status: Home / Self Care | Payer: 59 | Source: Ambulatory Visit | Attending: Obstetrics | Admitting: Obstetrics

## 2017-11-04 VITALS — BP 135/76 | HR 96 | Wt 240.6 lb

## 2017-11-04 DIAGNOSIS — N898 Other specified noninflammatory disorders of vagina: Secondary | ICD-10-CM | POA: Diagnosis not present

## 2017-11-04 DIAGNOSIS — N760 Acute vaginitis: Secondary | ICD-10-CM

## 2017-11-04 DIAGNOSIS — N944 Primary dysmenorrhea: Secondary | ICD-10-CM | POA: Diagnosis not present

## 2017-11-04 DIAGNOSIS — B9689 Other specified bacterial agents as the cause of diseases classified elsewhere: Secondary | ICD-10-CM | POA: Diagnosis not present

## 2017-11-04 MED ORDER — TINIDAZOLE 500 MG PO TABS: 1.0000 g | ORAL_TABLET | Freq: Every day | ORAL | 5 refills | Status: DC

## 2017-11-04 MED ORDER — IBUPROFEN 800 MG PO TABS: 800.0000 mg | ORAL_TABLET | Freq: Three times a day (TID) | ORAL | 5 refills | Status: DC | PRN

## 2017-11-04 NOTE — Progress Notes (Signed)
Patient ID: Holly Brown, female   DOB: 05/28/1985, 32 y.o.   MRN: [id number]  Chief Complaint  Patient presents with  . Vaginal Discharge    HPI Holly Brown is a 32 y.o. female.  hAS malodorous vaginal discharge.  Also having intense cramping with periods. HPI  Past Medical History:  Diagnosis Date  . Migraines   . Seizures (HCC)    Jan.2017    Past Surgical History:  Procedure Laterality Date  . DILATION AND CURETTAGE OF UTERUS    . WISDOM TOOTH EXTRACTION  2011    Family History  Problem Relation Age of Onset  . Colon cancer Father   . Hypertension Maternal Grandmother   . Diabetes Maternal Grandmother   . Hypertension Paternal Grandmother   . Diabetes Mother     Social History Social History   Tobacco Use  . Smoking status: Current Some Day Smoker    Packs/day: 0.25    Types: Cigarettes    Start date: 12/21/2011  . Smokeless tobacco: Never Used  . Tobacco comment: patient is trying different methods of quitting  Substance Use Topics  . Alcohol use: Yes    Alcohol/week: 0.0 oz    Comment: ocassionally   . Drug use: No    No Known Allergies  Current Outpatient Medications  Medication Sig Dispense Refill  . levonorgestrel-ethinyl estradiol (SEASONALE,INTROVALE,JOLESSA) 0.15-0.03 MG tablet Take 1 tablet by mouth daily. 1 Package 4  . azithromycin (ZITHROMAX) 250 MG tablet Take 4 tablets all together now. (Patient not taking: Reported on 06/22/2017) 4 tablet 0  . etonogestrel-ethinyl estradiol (NUVARING) 0.12-0.015 MG/24HR vaginal ring Insert vaginally and leave in place for 3 consecutive weeks, then remove for 1 week. (Patient not taking: Reported on 11/04/2017) 1 each 12  . ibuprofen (ADVIL,MOTRIN) 800 MG tablet Take 1 tablet (800 mg total) by mouth every 8 (eight) hours as needed. 30 tablet 5  . metroNIDAZOLE (FLAGYL) 500 MG tablet Take 1 tablet (500 mg total) by mouth 2 (two) times daily. (Patient not taking: Reported on 06/22/2017) 14 tablet 0  .  tinidazole (TINDAMAX) 500 MG tablet Take 2 tablets (1,000 mg total) by mouth daily with breakfast. 10 tablet 5   Current Facility-Administered Medications  Medication Dose Route Frequency Provider Last Rate Last Dose  . 0.9 %  sodium chloride infusion  500 mL Intravenous Continuous Pyrtle, Carie CaddyJay M, MD        Review of Systems Review of Systems Constitutional: negative for fatigue and weight loss Respiratory: negative for cough and wheezing Cardiovascular: negative for chest pain, fatigue and palpitations Gastrointestinal: negative for abdominal pain and change in bowel habits Genitourinary:POSITIVE for malodorous vaginal discharge and painful periods Integument/breast: negative for nipple discharge Musculoskeletal:negative for myalgias Neurological: negative for gait problems and tremors Behavioral/Psych: negative for abusive relationship, depression Endocrine: negative for temperature intolerance      Blood pressure 135/76, pulse 96, weight 240 lb 9.6 oz (109.1 kg), last menstrual period 10/25/2017.  Physical Exam Physical Exam           General:  Alert and no distress Abdomen:  normal findings: no organomegaly, soft, non-tender and no hernia  Pelvis:  External genitalia: normal general appearance Urinary system: urethral meatus normal and bladder without fullness, nontender Vaginal: normal without tenderness, induration or masses Cervix: normal appearance Adnexa: normal bimanual exam Uterus: anteverted and non-tender, normal size    50% of 15 min visit spent on counseling and coordination of care.   Data Reviewed Wet Prep and cultures  Assessment     1. Vaginal discharge Rx: - Cervicovaginal ancillary only  2. BV (bacterial vaginosis) Rx: - tinidazole (TINDAMAX) 500 MG tablet; Take 2 tablets (1,000 mg total) by mouth daily with breakfast.  Dispense: 10 tablet; Refill: 5  3. Primary dysmenorrhea Rx: - ibuprofen (ADVIL,MOTRIN) 800 MG tablet; Take 1 tablet (800 mg  total) by mouth every 8 (eight) hours as needed.  Dispense: 30 tablet; Refill: 5    Plan    Follow up prn   No orders of the defined types were placed in this encounter.  Meds ordered this encounter  Medications  . tinidazole (TINDAMAX) 500 MG tablet    Sig: Take 2 tablets (1,000 mg total) by mouth daily with breakfast.    Dispense:  10 tablet    Refill:  5  . ibuprofen (ADVIL,MOTRIN) 800 MG tablet    Sig: Take 1 tablet (800 mg total) by mouth every 8 (eight) hours as needed.    Dispense:  30 tablet    Refill:  5    Brock Bad MD 11-04-2017

## 2017-11-04 NOTE — Progress Notes (Signed)
RGYN patient presents for problem visit today. CC: vaginal discharge for a few weeks now per pt. Pt also notes when she is on her cycle she will have sharp pains/spasms that last a few sec.and clots.

## 2017-11-05 LAB — CERVICOVAGINAL ANCILLARY ONLY
BACTERIAL VAGINITIS: POSITIVE — AB
CHLAMYDIA, DNA PROBE: NEGATIVE
Candida vaginitis: NEGATIVE
Neisseria Gonorrhea: NEGATIVE
TRICH (WINDOWPATH): NEGATIVE

## 2017-12-08 DIAGNOSIS — H52223 Regular astigmatism, bilateral: Secondary | ICD-10-CM | POA: Diagnosis not present

## 2017-12-22 ENCOUNTER — Ambulatory Visit: Payer: 59

## 2017-12-30 ENCOUNTER — Ambulatory Visit: Payer: 59

## 2018-01-04 ENCOUNTER — Ambulatory Visit (HOSPITAL_COMMUNITY)
Admission: EM | Admit: 2018-01-04 | Discharge: 2018-01-04 | Disposition: A | Discharge status: Home / Self Care | Payer: 59 | Attending: Family Medicine | Admitting: Family Medicine

## 2018-01-04 ENCOUNTER — Encounter (HOSPITAL_COMMUNITY): Payer: Self-pay | Admitting: Emergency Medicine

## 2018-01-04 ENCOUNTER — Telehealth (HOSPITAL_COMMUNITY): Payer: Self-pay

## 2018-01-04 DIAGNOSIS — R110 Nausea: Secondary | ICD-10-CM | POA: Diagnosis not present

## 2018-01-04 DIAGNOSIS — F17210 Nicotine dependence, cigarettes, uncomplicated: Secondary | ICD-10-CM | POA: Insufficient documentation

## 2018-01-04 DIAGNOSIS — R102 Pelvic and perineal pain: Secondary | ICD-10-CM | POA: Diagnosis not present

## 2018-01-04 DIAGNOSIS — N390 Urinary tract infection, site not specified: Secondary | ICD-10-CM | POA: Diagnosis not present

## 2018-01-04 DIAGNOSIS — N3000 Acute cystitis without hematuria: Secondary | ICD-10-CM

## 2018-01-04 DIAGNOSIS — N925 Other specified irregular menstruation: Secondary | ICD-10-CM

## 2018-01-04 DIAGNOSIS — R350 Frequency of micturition: Secondary | ICD-10-CM | POA: Diagnosis not present

## 2018-01-04 DIAGNOSIS — N644 Mastodynia: Secondary | ICD-10-CM | POA: Diagnosis not present

## 2018-01-04 DIAGNOSIS — Z800 Family history of malignant neoplasm of digestive organs: Secondary | ICD-10-CM | POA: Insufficient documentation

## 2018-01-04 DIAGNOSIS — Z3201 Encounter for pregnancy test, result positive: Secondary | ICD-10-CM | POA: Insufficient documentation

## 2018-01-04 LAB — POCT URINALYSIS DIP (DEVICE)
BILIRUBIN URINE: NEGATIVE
Glucose, UA: NEGATIVE mg/dL
Hgb urine dipstick: NEGATIVE
Ketones, ur: 15 mg/dL — AB
Nitrite: POSITIVE — AB
PH: 5.5 (ref 5.0–8.0)
Protein, ur: NEGATIVE mg/dL
SPECIFIC GRAVITY, URINE: 1.025 (ref 1.005–1.030)
Urobilinogen, UA: 0.2 mg/dL (ref 0.0–1.0)

## 2018-01-04 LAB — HCG, QUANTITATIVE, PREGNANCY: hCG, Beta Chain, Quant, S: 1638 m[IU]/mL — ABNORMAL HIGH (ref <5)

## 2018-01-04 LAB — POCT PREGNANCY, URINE: Preg Test, Ur: POSITIVE — AB

## 2018-01-04 MED ORDER — AMOXICILLIN-POT CLAVULANATE 875-125 MG PO TABS: 1.0000 | ORAL_TABLET | Freq: Two times a day (BID) | ORAL | 0 refills | Status: DC

## 2018-01-04 NOTE — Telephone Encounter (Signed)
Pt called regarding hcg. Educated on starting prenatal vitamins and refraining from alcohol, tobacco, and ibuprofen usage.  Patient had questions about using Excedrin migraine for headaches. Advised patient to just use tylenol until they can follow up with an OBGYN for further recommendations.

## 2018-01-04 NOTE — ED Provider Notes (Signed)
[facility]    CSN: [id number] Arrival date & time: 01/04/18  1003     History   Chief Complaint Chief Complaint  Patient presents with  . Pelvic Pain  . Possible Pregnancy    HPI Holly Brown is a 32 y.o. female.   Patient has urinary frequency and skipped her.  That was due last week.  Her periods are normal irregular.  As far as pregnancy symptoms she has having some tiredness and nausea and breast soreness.  HPI  Past Medical History:  Diagnosis Date  . Migraines   . Seizures (HCC)    Jan.2017    Patient Active Problem List   Diagnosis Date Noted  . Cervical high risk HPV (human papillomavirus) test positive 05/18/2017  . Gonorrhea 05/15/2017  . Trichomoniasis 05/15/2017  . Family history of colon cancer 10/10/2015  . Syncope 07/04/2014  . Heart palpitations 07/04/2014  . Chest pain 07/04/2014  . Contraception management 01/18/2013    Past Surgical History:  Procedure Laterality Date  . DILATION AND CURETTAGE OF UTERUS    . WISDOM TOOTH EXTRACTION  2011    OB History    Gravida  4   Para  3   Term  3   Preterm      AB  1   Living  3     SAB  1   TAB      Ectopic      Multiple      Live Births  3            Home Medications    Prior to Admission medications   Medication Sig Start Date End Date Taking? Authorizing Provider  azithromycin (ZITHROMAX) 250 MG tablet Take 4 tablets all together now. Patient not taking: Reported on 06/22/2017 05/15/17   Roe Coombsenney, Rachelle A, CNM  etonogestrel-ethinyl estradiol (NUVARING) 0.12-0.015 MG/24HR vaginal ring Insert vaginally and leave in place for 3 consecutive weeks, then remove for 1 week. Patient not taking: Reported on 11/04/2017 08/10/17   Orvilla Cornwallenney, Rachelle A, CNM  ibuprofen (ADVIL,MOTRIN) 800 MG tablet Take 1 tablet (800 mg total) by mouth every 8 (eight) hours as needed. 11/04/17   Brock BadHarper, Charles A, MD  levonorgestrel-ethinyl estradiol (SEASONALE,INTROVALE,JOLESSA) 0.15-0.03  MG tablet Take 1 tablet by mouth daily. Patient not taking: Reported on 01/04/2018 06/22/17   Orvilla Cornwallenney, Rachelle A, CNM  metroNIDAZOLE (FLAGYL) 500 MG tablet Take 1 tablet (500 mg total) by mouth 2 (two) times daily. Patient not taking: Reported on 06/22/2017 05/13/17   Roe Coombsenney, Rachelle A, CNM  tinidazole (TINDAMAX) 500 MG tablet Take 2 tablets (1,000 mg total) by mouth daily with breakfast. Patient not taking: Reported on 01/04/2018 11/04/17   Brock BadHarper, Charles A, MD    Family History Family History  Problem Relation Age of Onset  . Colon cancer Father   . Hypertension Maternal Grandmother   . Diabetes Maternal Grandmother   . Hypertension Paternal Grandmother   . Diabetes Mother     Social History Social History   Tobacco Use  . Smoking status: Current Some Day Smoker    Packs/day: 0.25    Types: Cigarettes    Start date: 12/21/2011  . Smokeless tobacco: Never Used  . Tobacco comment: patient is trying different methods of quitting  Substance Use Topics  . Alcohol use: Yes    Alcohol/week: 0.0 standard drinks    Comment: ocassionally   . Drug use: No     Allergies   Patient has no known allergies.  Review of Systems Review of Systems  Constitutional: Negative.   Genitourinary: Positive for frequency and pelvic pain.     Physical Exam Triage Vital Signs ED Triage Vitals [01/04/18 1122]  Enc Vitals Group     BP (!) 141/73     Pulse Rate 92     Resp 16     Temp 98.7 F (37.1 C)     Temp src      SpO2 100 %     Weight      Height      Head Circumference      Peak Flow      Pain Score      Pain Loc      Pain Edu?      Excl. in GC?    No data found.  Updated Vital Signs BP (!) 141/73   Pulse 92   Temp 98.7 F (37.1 C)   Resp 16   LMP 11/19/2017   SpO2 100%   Visual Acuity Right Eye Distance:   Left Eye Distance:   Bilateral Distance:    Right Eye Near:   Left Eye Near:    Bilateral Near:     Physical Exam  Constitutional: She appears  well-developed and well-nourished.  Cardiovascular: Normal rate, regular rhythm and normal heart sounds.  Pulmonary/Chest: Effort normal and breath sounds normal.  Abdominal: Soft. Bowel sounds are normal.     UC Treatments / Results  Labs (all labs ordered are listed, but only abnormal results are displayed) Labs Reviewed  POCT URINALYSIS DIP (DEVICE) - Abnormal; Notable for the following components:      Result Value   Ketones, ur 15 (*)    Nitrite POSITIVE (*)    Leukocytes, UA SMALL (*)    All other components within normal limits  POCT PREGNANCY, URINE - Abnormal; Notable for the following components:   Preg Test, Ur POSITIVE (*)    All other components within normal limits    EKG None  Radiology No results found.  Procedures Procedures (including critical care time)  Medications Ordered in UC Medications - No data to display  Initial Impression / Assessment and Plan / UC Course  I have reviewed the triage vital signs and the nursing notes.  Pertinent labs & imaging results that were available during my care of the patient were reviewed by me and considered in my medical decision making (see chart for details).     Urinary tract infection.  Will treat with antibiotic Positive pregnancy test.  Will obtain quantitative hCG G to determine how far along pregnancy is Final Clinical Impressions(s) / UC Diagnoses   Final diagnoses:  None   Discharge Instructions   None    ED Prescriptions    None     Controlled Substance Prescriptions [place] Controlled Substance Registry consulted? No   Frederica Kuster, MD 01/04/18 1145

## 2018-01-04 NOTE — ED Triage Notes (Signed)
Pt c/o lower pelvic pain, states her last cycle was the beginning of august. Took preg test at home with possible positive.

## 2018-01-05 ENCOUNTER — Inpatient Hospital Stay (HOSPITAL_COMMUNITY): Payer: 59

## 2018-01-05 ENCOUNTER — Other Ambulatory Visit: Payer: Self-pay

## 2018-01-05 ENCOUNTER — Encounter (HOSPITAL_COMMUNITY): Payer: Self-pay | Admitting: *Deleted

## 2018-01-05 ENCOUNTER — Inpatient Hospital Stay (HOSPITAL_COMMUNITY)
Admission: AD | Admit: 2018-01-05 | Discharge: 2018-01-05 | Disposition: A | Discharge status: Home / Self Care | Payer: 59 | Source: Ambulatory Visit | Attending: Obstetrics & Gynecology | Admitting: Obstetrics & Gynecology

## 2018-01-05 DIAGNOSIS — O2311 Infections of bladder in pregnancy, first trimester: Secondary | ICD-10-CM | POA: Insufficient documentation

## 2018-01-05 DIAGNOSIS — Z3A01 Less than 8 weeks gestation of pregnancy: Secondary | ICD-10-CM | POA: Insufficient documentation

## 2018-01-05 DIAGNOSIS — R109 Unspecified abdominal pain: Secondary | ICD-10-CM | POA: Diagnosis not present

## 2018-01-05 DIAGNOSIS — B9689 Other specified bacterial agents as the cause of diseases classified elsewhere: Secondary | ICD-10-CM | POA: Diagnosis not present

## 2018-01-05 DIAGNOSIS — N760 Acute vaginitis: Secondary | ICD-10-CM

## 2018-01-05 DIAGNOSIS — O23591 Infection of other part of genital tract in pregnancy, first trimester: Secondary | ICD-10-CM | POA: Insufficient documentation

## 2018-01-05 DIAGNOSIS — O26891 Other specified pregnancy related conditions, first trimester: Secondary | ICD-10-CM | POA: Diagnosis not present

## 2018-01-05 DIAGNOSIS — O3680X0 Pregnancy with inconclusive fetal viability, not applicable or unspecified: Secondary | ICD-10-CM

## 2018-01-05 DIAGNOSIS — N3000 Acute cystitis without hematuria: Secondary | ICD-10-CM

## 2018-01-05 LAB — WET PREP, GENITAL
Sperm: NONE SEEN
TRICH WET PREP: NONE SEEN
YEAST WET PREP: NONE SEEN

## 2018-01-05 LAB — HCG, QUANTITATIVE, PREGNANCY: hCG, Beta Chain, Quant, S: 2191 m[IU]/mL — ABNORMAL HIGH (ref <5)

## 2018-01-05 MED ORDER — CEPHALEXIN 500 MG PO CAPS: 500.0000 mg | ORAL_CAPSULE | Freq: Four times a day (QID) | ORAL | 0 refills | Status: DC

## 2018-01-05 MED ORDER — CEPHALEXIN 500 MG PO CAPS: 500.0000 mg | ORAL_CAPSULE | Freq: Three times a day (TID) | ORAL | 0 refills | Status: DC

## 2018-01-05 NOTE — MAU Provider Note (Signed)
History     CSN: [id number]  Arrival date and time: 01/05/18 1422   First Provider Initiated Contact with Patient 01/05/18 1520      Chief Complaint  Patient presents with  . Abdominal Pain   Holly Brown is a 32 y.o. [id number] at 5161w4d with 1 week history of suprapubic abdominal pain.  She describes intermittent menstrual-like cramps that are at times severe.  Pain does not radiate.  No associated vaginal spotting or bleeding.  She tried ibuprofen with partial relief earlier in the week before she knew she was pregnant.  She denies vaginal bleeding.  [facility] for urinary frequency and late menses yesterday and given Augmentin for UTI which she started yesterday. Culture shows >100,000 E. Coli with sensitivities pending.      OB History  Gravida Para Term Preterm AB Living  5 3 3   1 3   SAB TAB Ectopic Multiple Live Births  1       3    # Outcome Date GA Lbr Len/2nd Weight Sex Delivery Anes PTL Lv  5 Current           4 Term 11/08/12 1761w4d  3532 g M Vag-Spont None  LIV     Birth Comments: No complications during pregnancy or delivery.  No special care nursery, went home with mother after 2 days.  3 Term     M Vag-Spont   LIV  2 Term     F Vag-Spont   LIV  1 SAB              Past Medical History:  Diagnosis Date  . Migraines   . Seizures (HCC)    Jan.2017    Past Surgical History:  Procedure Laterality Date  . DILATION AND CURETTAGE OF UTERUS    . WISDOM TOOTH EXTRACTION  2011    Family History  Problem Relation Age of Onset  . Colon cancer Father   . Hypertension Maternal Grandmother   . Diabetes Maternal Grandmother   . Hypertension Paternal Grandmother   . Diabetes Mother     Social History   Tobacco Use  . Smoking status: Current Some Day Smoker    Packs/day: 0.25    Types: Cigarettes    Start date: 12/21/2011  . Smokeless tobacco: Never Used  . Tobacco comment: patient is trying different methods of quitting  Substance Use  Topics  . Alcohol use: Yes    Alcohol/week: 0.0 standard drinks    Comment: ocassionally   . Drug use: No    Allergies: No Known Allergies  Facility-Administered Medications Prior to Admission  Medication Dose Route Frequency Provider Last Rate Last Dose  . 0.9 %  sodium chloride infusion  500 mL Intravenous Continuous Pyrtle, Carie CaddyJay M, MD       Medications Prior to Admission  Medication Sig Dispense Refill Last Dose  . amoxicillin-clavulanate (AUGMENTIN) 875-125 MG tablet Take 1 tablet by mouth 2 (two) times daily. 10 tablet 0   . azithromycin (ZITHROMAX) 250 MG tablet Take 4 tablets all together now. (Patient not taking: Reported on 06/22/2017) 4 tablet 0 Not Taking  . etonogestrel-ethinyl estradiol (NUVARING) 0.12-0.015 MG/24HR vaginal ring Insert vaginally and leave in place for 3 consecutive weeks, then remove for 1 week. (Patient not taking: Reported on 11/04/2017) 1 each 12 Not Taking  . ibuprofen (ADVIL,MOTRIN) 800 MG tablet Take 1 tablet (800 mg total) by mouth every 8 (eight) hours as needed. 30 tablet 5   .  levonorgestrel-ethinyl estradiol (SEASONALE,INTROVALE,JOLESSA) 0.15-0.03 MG tablet Take 1 tablet by mouth daily. (Patient not taking: Reported on 01/04/2018) 1 Package 4 Not Taking at Unknown time  . metroNIDAZOLE (FLAGYL) 500 MG tablet Take 1 tablet (500 mg total) by mouth 2 (two) times daily. (Patient not taking: Reported on 06/22/2017) 14 tablet 0 Not Taking  . tinidazole (TINDAMAX) 500 MG tablet Take 2 tablets (1,000 mg total) by mouth daily with breakfast. (Patient not taking: Reported on 01/04/2018) 10 tablet 5 Not Taking at Unknown time    Review of Systems Physical Exam   Blood pressure 116/73, pulse 92, temperature 98.6 F (37 C), temperature source Oral, resp. rate 17, height 5\' 10"  (1.778 m), weight 104.6 kg, last menstrual period 11/20/2017, SpO2 100 %.  Physical Exam  Nursing note and vitals reviewed. Constitutional: She is oriented to person, place, and time. She  appears well-developed and well-nourished.  HENT:  Head: Normocephalic.  Eyes: No scleral icterus.  Neck: Normal range of motion.  Cardiovascular: Normal rate.  Respiratory: Effort normal.  GI: Soft. There is tenderness.  Minimally tender in suprapubic region  Musculoskeletal: Normal range of motion.  Neurological: She is alert and oriented to person, place, and time.  Skin: Skin is warm and dry.    MAU Course  Procedures Results for orders placed or performed during the hospital encounter of 01/05/18 (from the past 24 hour(s))  Wet prep, genital     Status: Abnormal   Collection Time: 01/05/18  3:59 PM  Result Value Ref Range   Yeast Wet Prep HPF POC NONE SEEN NONE SEEN   Trich, Wet Prep NONE SEEN NONE SEEN   Clue Cells Wet Prep HPF POC PRESENT (A) NONE SEEN   WBC, Wet Prep HPF POC MODERATE (A) NONE SEEN   Sperm NONE SEEN   hCG, quantitative, pregnancy     Status: Abnormal   Collection Time: 01/05/18  4:04 PM  Result Value Ref Range   hCG, Beta Chain, Quant, S 2,191 (H) <5 mIU/mL   US Ob Less Than 14 Weeks With Ob Transvaginal  Result Date: 01/05/2018 CLINICAL DATA:  Abdominal pain in 1st trimester pregnancy. EXAM: OBSTETRIC <14 WK [place] AND TRANSVAGINAL OB US TECHNIQUE: Both transabdominal and transvaginal ultrasound examinations were performed for complete evaluation of the gestation as well as the maternal uterus, adnexal regions, and pelvic cul-de-sac. Transvaginal technique was performed to assess early pregnancy. COMPARISON:  None. FINDINGS: Intrauterine gestational sac: Single, with double decidual sac sign noted Yolk sac:  Not Visualized. Embryo:  Not Visualized. MSD: 3 mm   5 w   0 d Subchorionic hemorrhage:  None visualized. Maternal uterus/adnexae: Normal appearance of both ovaries. No mass or abnormal free fluid identified. IMPRESSION: Single early intrauterine gestational sac measuring 5 weeks 0 days by mean sac diameter. Consider following b-hCG levels, with followup  ultrasound to assess viability in 10-14 days. No significant maternal uterine or adnexal abnormality identified. Electronically Signed   By: Myles Rosenthal M.D.   On: 01/05/2018 16:43    Will D/C Augmentin, start Keflex. Advised increased fluids.  Ectopic and bleeding precautions discussed. Return for severe pain or heavy bleeding.  Assessment and Plan   1. Abdominal pain in pregnancy, first trimester   2. BV (bacterial vaginosis)   3. Acute cystitis without hematuria   4. Pregnancy of unknown anatomic location    Allergies as of 01/05/2018   No Known Allergies     Medication List    STOP taking these medications  amoxicillin-clavulanate 875-125 MG tablet Commonly known as:  AUGMENTIN   azithromycin 250 MG tablet Commonly known as:  ZITHROMAX   etonogestrel-ethinyl estradiol 0.12-0.015 MG/24HR vaginal ring Commonly known as:  NUVARING   ibuprofen 800 MG tablet Commonly known as:  ADVIL,MOTRIN   levonorgestrel-ethinyl estradiol 0.15-0.03 MG tablet Commonly known as:  SEASONALE,INTROVALE,JOLESSA   metroNIDAZOLE 500 MG tablet Commonly known as:  FLAGYL   tinidazole 500 MG tablet Commonly known as:  TINDAMAX     TAKE these medications   cephALEXin 500 MG capsule Commonly known as:  KEFLEX Take 1 capsule (500 mg total) by mouth 3 (three) times daily.      Follow-up Information    [facility] .   Specialty:  Obstetrics and Gynecology Contact information: [address] [place] [address] [phone]            CNM 01/05/2018, 3:20 PM

## 2018-01-05 NOTE — Discharge Instructions (Signed)
Abdominal Pain During Pregnancy Abdominal pain is common in pregnancy. Most of the time, it does not cause harm. There are many causes of abdominal pain. Some causes are more serious than others and sometimes the cause is not known. Abdominal pain can be a sign that something is very wrong with the pregnancy or the pain may have nothing to do with the pregnancy. Always tell your health care provider if you have any abdominal pain. Follow these instructions at home:  Do not have sex or put anything in your vagina until your symptoms go away completely.  Watch your abdominal pain for any changes.  Get plenty of rest until your pain improves.  Drink enough fluid to keep your urine clear or pale yellow.  Take over-the-counter or prescription medicines only as told by your health care provider.  Keep all follow-up visits as told by your health care provider. This is important. Contact a health care provider if:  You have a fever.  Your pain gets worse or you have cramping.  Your pain continues after resting. Get help right away if:  You are bleeding, leaking fluid, or passing tissue from the vagina.  You have vomiting or diarrhea that does not go away.  You have painful or bloody urination.  You notice a decrease in your baby's movements.  You feel very weak or faint.  You have shortness of breath.  You develop a severe headache with abdominal pain.  You have abnormal vaginal discharge with abdominal pain. This information is not intended to replace advice given to you by your health care provider. Make sure you discuss any questions you have with your health care provider. Document Released: 04/07/2005 Document Revised: 01/17/2016 Document Reviewed: 11/04/2012 Elsevier Interactive Patient Education  2018 [employer].  Ectopic Pregnancy An ectopic pregnancy is when the fertilized egg attaches (implants) outside the uterus. Most ectopic pregnancies occur in one of the  tubes where eggs travel from the ovary to the uterus (fallopian tubes), but the implanting can occur in other locations. In rare cases, ectopic pregnancies occur on the ovary, intestine, pelvis, abdomen, or cervix. In an ectopic pregnancy, the fertilized egg does not have the ability to develop into a normal, healthy baby. A ruptured ectopic pregnancy is one in which tearing or bursting of a fallopian tube causes internal bleeding. Often, there is intense lower abdominal pain, and vaginal bleeding sometimes occurs. Having an ectopic pregnancy can be life-threatening. If this dangerous condition is not treated, it can lead to blood loss, shock, or even death. What are the causes? The most common cause of this condition is damage to one of the fallopian tubes. A fallopian tube may be narrowed or blocked, and that keeps the fertilized egg from reaching the uterus. What increases the risk? This condition is more likely to develop in women of childbearing age who have different levels of risk. The levels of risk can be divided into three categories. High risk  You have gone through infertility treatment.  You have had an ectopic pregnancy before.  You have had surgery on the fallopian tubes, or another surgical procedure, such as an abortion.  You have had surgery to have the fallopian tubes tied (tubal ligation).  You have problems or diseases of the fallopian tubes.  You have been exposed to diethylstilbestrol (DES). This medicine was used until 1971, and it had effects on babies whose mothers took the medicine.  You become pregnant while using an IUD (intrauterine device) for birth  control. Moderate risk  You have a history of infertility.  You have had an STI (sexually transmitted infection).  You have a history of pelvic inflammatory disease (PID).  You have scarring from endometriosis.  You have multiple sexual partners.  You smoke. Low risk  You have had pelvic surgery.  You  use vaginal douches.  You became sexually active before age 51. What are the signs or symptoms? Common symptoms of this condition include normal pregnancy symptoms, such as missing a period, nausea, tiredness, abdominal pain, breast tenderness, and bleeding. However, ectopic pregnancy will have additional symptoms, such as:  Pain with intercourse.  Irregular vaginal bleeding or spotting.  Cramping or pain on one side or in the lower abdomen.  Fast heartbeat, low blood pressure, and sweating.  Passing out while having a bowel movement.  Symptoms of a ruptured ectopic pregnancy and internal bleeding may include:  Sudden, severe pain in the abdomen and pelvis.  Dizziness, weakness, light-headedness, or fainting.  Pain in the shoulder or neck area.  How is this diagnosed? This condition is diagnosed by:  A pelvic exam to locate pain or a mass in the abdomen.  A pregnancy test. This blood test checks for the presence as well as the specific level of pregnancy hormone in the bloodstream.  Ultrasound. This is performed if a pregnancy test is positive. In this test, a probe is inserted into the vagina. The probe will detect a fetus, possibly in a location other than the uterus.  Taking a sample of uterus tissue (dilation and curettage, or D&C).  Surgery to perform a visual exam of the inside of the abdomen using a thin, lighted tube that has a tiny camera on the end (laparoscope).  Culdocentesis. This procedure involves inserting a needle at the top of the vagina, behind the uterus. If blood is present in this area, it may indicate that a fallopian tube is torn.  How is this treated? This condition is treated with medicine or surgery. Medicine  An injection of a medicine (methotrexate) may be given to cause the pregnancy tissue to be absorbed. This medicine may save your fallopian tube. It may be given if: ? The diagnosis is made early, with no signs of active bleeding. ? The  fallopian tube has not ruptured. ? You are considered to be a good candidate for the medicine. Usually, pregnancy hormone blood levels are checked after methotrexate treatment. This is to be sure that the medicine is effective. It may take 4-6 weeks for the pregnancy to be absorbed. Most pregnancies will be absorbed by 3 weeks. Surgery  A laparoscope may be used to remove the pregnancy tissue.  If severe internal bleeding occurs, a larger cut (incision) may be made in the lower abdomen (laparotomy) to remove the fetus and placenta. This is done to stop the bleeding.  Part or all of the fallopian tube may be removed (salpingectomy) along with the fetus and placenta. The fallopian tube may also be repaired during the surgery.  In very rare circumstances, removal of the uterus (hysterectomy) may be required.  After surgery, pregnancy hormone testing may be done to be sure that there is no pregnancy tissue left. Whether your treatment is medicine or surgery, you may receive a Rho (D) immune globulin shot to prevent problems with any future pregnancy. This shot may be given if:  You are Rh-negative and the baby's father is Rh-positive.  You are Rh-negative and you do not know the Rh type of the  baby's father.  Follow these instructions at home:  Rest and limit your activity after the procedure for as long as told by your health care provider.  Until your health care provider says that it is safe: ? Do not lift anything that is heavier than 10 lb (4.5 kg), or the limit that your health care provider tells you. ? Avoid physical exercise and any movement that requires effort (is strenuous).  To help prevent constipation: ? Eat a healthy diet that includes fruits, vegetables, and whole grains. ? Drink 6-8 glasses of water per day. Get help right away if:  You develop worsening pain that is not relieved by medicine.  You have: ? A fever or chills. ? Vaginal bleeding. ? Redness and  swelling at the incision site. ? Nausea and vomiting.  You feel dizzy or weak.  You feel light-headed or you faint. This information is not intended to replace advice given to you by your health care provider. Make sure you discuss any questions you have with your health care provider. Document Released: 05/15/2004 Document Revised: 12/05/2015 Document Reviewed: 11/07/2015 Elsevier Interactive Patient Education  2018 [employer]. Bacterial Vaginosis Bacterial vaginosis is a vaginal infection that occurs when the normal balance of bacteria in the vagina is disrupted. It results from an overgrowth of certain bacteria. This is the most common vaginal infection among women ages 36-44. Because bacterial vaginosis increases your risk for STIs (sexually transmitted infections), getting treated can help reduce your risk for chlamydia, gonorrhea, herpes, and HIV (human immunodeficiency virus). Treatment is also important for preventing complications in pregnant women, because this condition can cause an early (premature) delivery. What are the causes? This condition is caused by an increase in harmful bacteria that are normally present in small amounts in the vagina. However, the reason that the condition develops is not fully understood. What increases the risk? The following factors may make you more likely to develop this condition:  Having a new sexual partner or multiple sexual partners.  Having unprotected sex.  Douching.  Having an intrauterine device (IUD).  Smoking.  Drug and alcohol abuse.  Taking certain antibiotic medicines.  Being pregnant.  You cannot get bacterial vaginosis from toilet seats, bedding, swimming pools, or contact with objects around you. What are the signs or symptoms? Symptoms of this condition include:  Grey or white vaginal discharge. The discharge can also be watery or foamy.  A fish-like odor with discharge, especially after sexual intercourse or  during menstruation.  Itching in and around the vagina.  Burning or pain with urination.  Some women with bacterial vaginosis have no signs or symptoms. How is this diagnosed? This condition is diagnosed based on:  Your medical history.  A physical exam of the vagina.  Testing a sample of vaginal fluid under a microscope to look for a large amount of bad bacteria or abnormal cells. Your health care provider may use a cotton swab or a small wooden spatula to collect the sample.  How is this treated? This condition is treated with antibiotics. These may be given as a pill, a vaginal cream, or a medicine that is put into the vagina (suppository). If the condition comes back after treatment, a second round of antibiotics may be needed. Follow these instructions at home: Medicines  Take over-the-counter and prescription medicines only as told by your health care provider.  Take or use your antibiotic as told by your health care provider. Do not stop taking or using the antibiotic  even if you start to feel better. General instructions  If you have a female sexual partner, tell her that you have a vaginal infection. She should see her health care provider and be treated if she has symptoms. If you have a female sexual partner, he does not need treatment.  During treatment: ? Avoid sexual activity until you finish treatment. ? Do not douche. ? Avoid alcohol as directed by your health care provider. ? Avoid breastfeeding as directed by your health care provider.  Drink enough water and fluids to keep your urine clear or pale yellow.  Keep the area around your vagina and rectum clean. ? Wash the area daily with warm water. ? Wipe yourself from front to back after using the toilet.  Keep all follow-up visits as told by your health care provider. This is important. How is this prevented?  Do not douche.  Wash the outside of your vagina with warm water only.  Use protection when  having sex. This includes latex condoms and dental dams.  Limit how many sexual partners you have. To help prevent bacterial vaginosis, it is best to have sex with just one partner (monogamous).  Make sure you and your sexual partner are tested for STIs.  Wear cotton or cotton-lined underwear.  Avoid wearing tight pants and pantyhose, especially during summer.  Limit the amount of alcohol that you drink.  Do not use any products that contain nicotine or tobacco, such as cigarettes and e-cigarettes. If you need help quitting, ask your health care provider.  Do not use illegal drugs. Where to find more information:  Centers for Disease Control and Prevention: SolutionApps.co.zawww.cdc.gov/std  American Sexual Health Association (ASHA): www.ashastd.org  [place]. Department of Health and Health and safety inspectorHuman Services, Office on Women's Health: ConventionalMedicines.siwww.womenshealth.gov/ or http://www.anderson-williamson.info/https://www.womenshealth.gov/a-z-topics/bacterial-vaginosis Contact a health care provider if:  Your symptoms do not improve, even after treatment.  You have more discharge or pain when urinating.  You have a fever.  You have pain in your abdomen.  You have pain during sex.  You have vaginal bleeding between periods. Summary  Bacterial vaginosis is a vaginal infection that occurs when the normal balance of bacteria in the vagina is disrupted.  Because bacterial vaginosis increases your risk for STIs (sexually transmitted infections), getting treated can help reduce your risk for chlamydia, gonorrhea, herpes, and HIV (human immunodeficiency virus). Treatment is also important for preventing complications in pregnant women, because the condition can cause an early (premature) delivery.  This condition is treated with antibiotic medicines. These may be given as a pill, a vaginal cream, or a medicine that is put into the vagina (suppository). This information is not intended to replace advice given to you by your health care provider. Make sure you  discuss any questions you have with your health care provider. Document Released: 04/07/2005 Document Revised: 08/11/2016 Document Reviewed: 12/22/2015 Elsevier Interactive Patient Education  [employer]2018 [employer] Inc.

## 2018-01-05 NOTE — MAU Note (Signed)
Thought she was preg.  Been cramping for the longest, feels like she was going to start her period. [facility] is when she found out she was preg.  No bleeding or d/c.

## 2018-01-05 NOTE — Progress Notes (Signed)
Vaginal cultures obtained & sent to lab. 

## 2018-01-06 LAB — URINE CULTURE

## 2018-01-06 LAB — GC/CHLAMYDIA PROBE AMP (~~LOC~~) NOT AT ARMC
Chlamydia: NEGATIVE
Neisseria Gonorrhea: POSITIVE — AB

## 2018-01-06 LAB — HIV ANTIBODY (ROUTINE TESTING W REFLEX): HIV SCREEN 4TH GENERATION: NONREACTIVE

## 2018-01-07 ENCOUNTER — Telehealth (HOSPITAL_COMMUNITY): Payer: Self-pay

## 2018-01-07 NOTE — Telephone Encounter (Signed)
Urine culture positive for E.coli. This was [facility] with Keflex.  Pt called and made aware.

## 2018-01-08 ENCOUNTER — Ambulatory Visit (INDEPENDENT_AMBULATORY_CARE_PROVIDER_SITE_OTHER): Payer: 59 | Admitting: *Deleted

## 2018-01-08 ENCOUNTER — Other Ambulatory Visit: Payer: Self-pay | Admitting: *Deleted

## 2018-01-08 VITALS — BP 119/72 | HR 86 | Wt 233.3 lb

## 2018-01-08 DIAGNOSIS — A549 Gonococcal infection, unspecified: Secondary | ICD-10-CM | POA: Diagnosis not present

## 2018-01-08 DIAGNOSIS — Z3200 Encounter for pregnancy test, result unknown: Secondary | ICD-10-CM

## 2018-01-08 MED ORDER — AZITHROMYCIN 250 MG PO TABS
1000.0000 mg | ORAL_TABLET | Freq: Once | ORAL | Status: AC
  Administered 2018-01-08: 1000 mg via ORAL

## 2018-01-08 MED ORDER — CEFTRIAXONE SODIUM 250 MG IJ SOLR
250.0000 mg | Freq: Once | INTRAMUSCULAR | Status: AC
  Administered 2018-01-08: 250 mg via INTRAMUSCULAR

## 2018-01-08 NOTE — Progress Notes (Signed)
I have reviewed the chart and agree with nursing staff's documentation of this patient's encounter.  Vonzella NippleJulie Wenzel, PA-C 01/08/2018 3:47 PM

## 2018-01-08 NOTE — Progress Notes (Signed)
Mickie Kayesiree E Nishikawa here for Rocephin  Injection for positive gonorrhea test on 01/05/18 while in [facility].  Injection administered without complication. Pt also given Zithromax 1g for 1 dose which she took while here in the office. [facility] and left [employer] message.  Osvaldo Humanshley P , RN 01/08/2018  3:36 PM

## 2018-01-11 ENCOUNTER — Ambulatory Visit: Payer: 59

## 2018-01-20 ENCOUNTER — Ambulatory Visit (HOSPITAL_COMMUNITY)
Admission: RE | Admit: 2018-01-20 | Discharge: 2018-01-20 | Disposition: A | Discharge status: Home / Self Care | Payer: 59 | Source: Ambulatory Visit | Attending: Obstetrics & Gynecology | Admitting: Obstetrics & Gynecology

## 2018-01-20 ENCOUNTER — Ambulatory Visit (INDEPENDENT_AMBULATORY_CARE_PROVIDER_SITE_OTHER): Payer: 59 | Admitting: *Deleted

## 2018-01-20 ENCOUNTER — Telehealth: Payer: Self-pay | Admitting: Family Medicine

## 2018-01-20 DIAGNOSIS — Z3200 Encounter for pregnancy test, result unknown: Secondary | ICD-10-CM

## 2018-01-20 DIAGNOSIS — Z3480 Encounter for supervision of other normal pregnancy, unspecified trimester: Secondary | ICD-10-CM

## 2018-01-20 DIAGNOSIS — Z3481 Encounter for supervision of other normal pregnancy, first trimester: Secondary | ICD-10-CM

## 2018-01-20 DIAGNOSIS — O3680X0 Pregnancy with inconclusive fetal viability, not applicable or unspecified: Secondary | ICD-10-CM | POA: Diagnosis not present

## 2018-01-20 DIAGNOSIS — Z3201 Encounter for pregnancy test, result positive: Secondary | ICD-10-CM | POA: Insufficient documentation

## 2018-01-20 DIAGNOSIS — Z3A01 Less than 8 weeks gestation of pregnancy: Secondary | ICD-10-CM | POA: Diagnosis not present

## 2018-01-20 NOTE — Telephone Encounter (Signed)
Patient left a message with the after hours line wanting someone to call her back in regards to her ultrasound that she had today (01/20/18). Stating that ultrasound given is not adding up to what she was told and wants a further explanation.

## 2018-01-20 NOTE — Progress Notes (Signed)
Here for results of [place]. Reviewed with Dr. Earlene Plater and informed patient US shows live baby [redacted]w[redacted]d with EDD 09/07/18. Already has ob appt with [facility]. Not taking any meds- will start prenatal vitamins. Pictures given.

## 2018-01-20 NOTE — Progress Notes (Signed)
I have reviewed this chart and agree with the RN/CMA assessment and management.    K. Meryl Davis, M.D. [facility]  

## 2018-01-22 NOTE — Telephone Encounter (Signed)
Called patient and she states she was confused because they told her she was 7 weeks but at the top of the ultrasound picture it says 11/20/17 and that she would've been [redacted]w[redacted]d. Told patient that was based off her reported LMP but the ultrasound revealed that day, that wasn't the best dating for her. Discussed she is [redacted]w[redacted]d today and reviewed due date. Patient verbalized understanding & had no other questions.

## 2018-02-01 ENCOUNTER — Encounter: Payer: Self-pay | Admitting: Advanced Practice Midwife

## 2018-02-01 ENCOUNTER — Other Ambulatory Visit (HOSPITAL_COMMUNITY)
Admission: RE | Admit: 2018-02-01 | Discharge: 2018-02-01 | Disposition: A | Discharge status: Home / Self Care | Payer: 59 | Source: Ambulatory Visit | Attending: Advanced Practice Midwife | Admitting: Advanced Practice Midwife

## 2018-02-01 ENCOUNTER — Encounter: Payer: Self-pay | Admitting: Obstetrics

## 2018-02-01 ENCOUNTER — Ambulatory Visit (INDEPENDENT_AMBULATORY_CARE_PROVIDER_SITE_OTHER): Payer: 59 | Admitting: Advanced Practice Midwife

## 2018-02-01 VITALS — BP 135/81 | HR 86 | Wt 234.6 lb

## 2018-02-01 DIAGNOSIS — O09899 Supervision of other high risk pregnancies, unspecified trimester: Secondary | ICD-10-CM | POA: Insufficient documentation

## 2018-02-01 DIAGNOSIS — Z3A00 Weeks of gestation of pregnancy not specified: Secondary | ICD-10-CM | POA: Insufficient documentation

## 2018-02-01 DIAGNOSIS — K117 Disturbances of salivary secretion: Secondary | ICD-10-CM

## 2018-02-01 DIAGNOSIS — O0990 Supervision of high risk pregnancy, unspecified, unspecified trimester: Secondary | ICD-10-CM | POA: Insufficient documentation

## 2018-02-01 DIAGNOSIS — Z3480 Encounter for supervision of other normal pregnancy, unspecified trimester: Secondary | ICD-10-CM | POA: Diagnosis not present

## 2018-02-01 DIAGNOSIS — N760 Acute vaginitis: Secondary | ICD-10-CM | POA: Insufficient documentation

## 2018-02-01 DIAGNOSIS — Z23 Encounter for immunization: Secondary | ICD-10-CM

## 2018-02-01 DIAGNOSIS — Z3481 Encounter for supervision of other normal pregnancy, first trimester: Secondary | ICD-10-CM

## 2018-02-01 DIAGNOSIS — O23599 Infection of other part of genital tract in pregnancy, unspecified trimester: Secondary | ICD-10-CM | POA: Insufficient documentation

## 2018-02-01 DIAGNOSIS — O09211 Supervision of pregnancy with history of pre-term labor, first trimester: Secondary | ICD-10-CM

## 2018-02-01 DIAGNOSIS — A549 Gonococcal infection, unspecified: Secondary | ICD-10-CM

## 2018-02-01 DIAGNOSIS — B9689 Other specified bacterial agents as the cause of diseases classified elsewhere: Secondary | ICD-10-CM | POA: Diagnosis not present

## 2018-02-01 DIAGNOSIS — O09219 Supervision of pregnancy with history of pre-term labor, unspecified trimester: Secondary | ICD-10-CM

## 2018-02-01 DIAGNOSIS — R87810 Cervical high risk human papillomavirus (HPV) DNA test positive: Secondary | ICD-10-CM

## 2018-02-01 MED ORDER — GLYCOPYRROLATE 2 MG PO TABS: 2.0000 mg | ORAL_TABLET | Freq: Three times a day (TID) | ORAL | 3 refills | Status: DC | PRN

## 2018-02-01 NOTE — Patient Instructions (Signed)
First Trimester of Pregnancy The first trimester of pregnancy is from week 1 until the end of week 13 (months 1 through 3). A week after a sperm fertilizes an egg, the egg will implant on the wall of the uterus. This embryo will begin to develop into a baby. Genes from you and your partner will form the baby. The female genes will determine whether the baby will be a boy or a girl. At 6-8 weeks, the eyes and face will be formed, and the heartbeat can be seen on ultrasound. At the end of 12 weeks, all the baby's organs will be formed. Now that you are pregnant, you will want to do everything you can to have a healthy baby. Two of the most important things are to get good prenatal care and to follow your health care provider's instructions. Prenatal care is all the medical care you receive before the baby's birth. This care will help prevent, find, and treat any problems during the pregnancy and childbirth. Body changes during your first trimester Your body goes through many changes during pregnancy. The changes vary from woman to woman.  You may gain or lose a couple of pounds at first.  You may feel sick to your stomach (nauseous) and you may throw up (vomit). If the vomiting is uncontrollable, call your health care provider.  You may tire easily.  You may develop headaches that can be relieved by medicines. All medicines should be approved by your health care provider.  You may urinate more often. Painful urination may mean you have a bladder infection.  You may develop heartburn as a result of your pregnancy.  You may develop constipation because certain hormones are causing the muscles that push stool through your intestines to slow down.  You may develop hemorrhoids or swollen veins (varicose veins).  Your breasts may begin to grow larger and become tender. Your nipples may stick out more, and the tissue that surrounds them (areola) may become darker.  Your gums may bleed and may be  sensitive to brushing and flossing.  Dark spots or blotches (chloasma, mask of pregnancy) may develop on your face. This will likely fade after the baby is born.  Your menstrual periods will stop.  You may have a loss of appetite.  You may develop cravings for certain kinds of food.  You may have changes in your emotions from day to day, such as being excited to be pregnant or being concerned that something may go wrong with the pregnancy and baby.  You may have more vivid and strange dreams.  You may have changes in your hair. These can include thickening of your hair, rapid growth, and changes in texture. Some women also have hair loss during or after pregnancy, or hair that feels dry or thin. Your hair will most likely return to normal after your baby is born.  What to expect at prenatal visits During a routine prenatal visit:  You will be weighed to make sure you and the baby are growing normally.  Your blood pressure will be taken.  Your abdomen will be measured to track your baby's growth.  The fetal heartbeat will be listened to between weeks 10 and 14 of your pregnancy.  Test results from any previous visits will be discussed.  Your health care provider may ask you:  How you are feeling.  If you are feeling the baby move.  If you have had any abnormal symptoms, such as leaking fluid, bleeding, severe headaches,   or abdominal cramping.  If you are using any tobacco products, including cigarettes, chewing tobacco, and electronic cigarettes.  If you have any questions.  Other tests that may be performed during your first trimester include:  Blood tests to find your blood type and to check for the presence of any previous infections. The tests will also be used to check for low iron levels (anemia) and protein on red blood cells (Rh antibodies). Depending on your risk factors, or if you previously had diabetes during pregnancy, you may have tests to check for high blood  sugar that affects pregnant women (gestational diabetes).  Urine tests to check for infections, diabetes, or protein in the urine.  An ultrasound to confirm the proper growth and development of the baby.  Fetal screens for spinal cord problems (spina bifida) and Down syndrome.  HIV (human immunodeficiency virus) testing. Routine prenatal testing includes screening for HIV, unless you choose not to have this test.  You may need other tests to make sure you and the baby are doing well.  Follow these instructions at home: Medicines  Follow your health care provider's instructions regarding medicine use. Specific medicines may be either safe or unsafe to take during pregnancy.  Take a prenatal vitamin that contains at least 600 micrograms (mcg) of folic acid.  If you develop constipation, try taking a stool softener if your health care provider approves. Eating and drinking  Eat a balanced diet that includes fresh fruits and vegetables, whole grains, good sources of protein such as meat, eggs, or tofu, and low-fat dairy. Your health care provider will help you determine the amount of weight gain that is right for you.  Avoid raw meat and uncooked cheese. These carry germs that can cause birth defects in the baby.  Eating four or five small meals rather than three large meals a day may help relieve nausea and vomiting. If you start to feel nauseous, eating a few soda crackers can be helpful. Drinking liquids between meals, instead of during meals, also seems to help ease nausea and vomiting.  Limit foods that are high in fat and processed sugars, such as fried and sweet foods.  To prevent constipation: ? Eat foods that are high in fiber, such as fresh fruits and vegetables, whole grains, and beans. ? Drink enough fluid to keep your urine clear or pale yellow. Activity  Exercise only as directed by your health care provider. Most women can continue their usual exercise routine during  pregnancy. Try to exercise for 30 minutes at least 5 days a week. Exercising will help you: ? Control your weight. ? Stay in shape. ? Be prepared for labor and delivery.  Experiencing pain or cramping in the lower abdomen or lower back is a good sign that you should stop exercising. Check with your health care provider before continuing with normal exercises.  Try to avoid standing for long periods of time. Move your legs often if you must stand in one place for a long time.  Avoid heavy lifting.  Wear low-heeled shoes and practice good posture.  You may continue to have sex unless your health care provider tells you not to. Relieving pain and discomfort  Wear a good support bra to relieve breast tenderness.  Take warm sitz baths to soothe any pain or discomfort caused by hemorrhoids. Use hemorrhoid cream if your health care provider approves.  Rest with your legs elevated if you have leg cramps or low back pain.  If you develop   varicose veins in your legs, wear support hose. Elevate your feet for 15 minutes, 3-4 times a day. Limit salt in your diet. Prenatal care  Schedule your prenatal visits by the twelfth week of pregnancy. They are usually scheduled monthly at first, then more often in the last 2 months before delivery.  Write down your questions. Take them to your prenatal visits.  Keep all your prenatal visits as told by your health care provider. This is important. Safety  Wear your seat belt at all times when driving.  Make a list of emergency phone numbers, including numbers for family, friends, the hospital, and police and fire departments. General instructions  Ask your health care provider for a referral to a local prenatal education class. Begin classes no later than the beginning of month 6 of your pregnancy.  Ask for help if you have counseling or nutritional needs during pregnancy. Your health care provider can offer advice or refer you to specialists for help  with various needs.  Do not use hot tubs, steam rooms, or saunas.  Do not douche or use tampons or scented sanitary pads.  Do not cross your legs for long periods of time.  Avoid cat litter boxes and soil used by cats. These carry germs that can cause birth defects in the baby and possibly loss of the fetus by miscarriage or stillbirth.  Avoid all smoking, herbs, alcohol, and medicines not prescribed by your health care provider. Chemicals in these products affect the formation and growth of the baby.  Do not use any products that contain nicotine or tobacco, such as cigarettes and e-cigarettes. If you need help quitting, ask your health care provider. You may receive counseling support and other resources to help you quit.  Schedule a dentist appointment. At home, brush your teeth with a soft toothbrush and be gentle when you floss. Contact a health care provider if:  You have dizziness.  You have mild pelvic cramps, pelvic pressure, or nagging pain in the abdominal area.  You have persistent nausea, vomiting, or diarrhea.  You have a bad smelling vaginal discharge.  You have pain when you urinate.  You notice increased swelling in your face, hands, legs, or ankles.  You are exposed to fifth disease or chickenpox.  You are exposed to German measles (rubella) and have never had it. Get help right away if:  You have a fever.  You are leaking fluid from your vagina.  You have spotting or bleeding from your vagina.  You have severe abdominal cramping or pain.  You have rapid weight gain or loss.  You vomit blood or material that looks like coffee grounds.  You develop a severe headache.  You have shortness of breath.  You have any kind of trauma, such as from a fall or a car accident. Summary  The first trimester of pregnancy is from week 1 until the end of week 13 (months 1 through 3).  Your body goes through many changes during pregnancy. The changes vary from  woman to woman.  You will have routine prenatal visits. During those visits, your health care provider will examine you, discuss any test results you may have, and talk with you about how you are feeling. This information is not intended to replace advice given to you by your health care provider. Make sure you discuss any questions you have with your health care provider. Document Released: 04/01/2001 Document Revised: 03/19/2016 Document Reviewed: 03/19/2016 Elsevier Interactive Patient Education  2018 [employer]   Inc[employer]  

## 2018-02-01 NOTE — Progress Notes (Signed)
   PRENATAL VISIT NOTE  Subjective:  Holly Brown is a 32 y.o. [phone] at [redacted]w[redacted]d being seen today for initial prenatal visit.  She is currently monitored for the following issues for this low-risk pregnancy and has Contraception management; Syncope; Heart palpitations; Chest pain; Family history of colon cancer; Gonorrhea; Trichomoniasis; Cervical high risk HPV (human papillomavirus) test positive; and Supervision of other normal pregnancy, antepartum on their problem list.  Patient reports spitting.  Contractions: Not present. Vag. Bleeding: None.   . Denies leaking of fluid.   The following portions of the patient's history were reviewed and updated as appropriate: allergies, current medications, past family history, past medical history, past social history, past surgical history and problem list. Problem list updated.  Objective:   Vitals:   02/01/18 1327  BP: 135/81  Pulse: 86  Weight: 106.4 kg    Fetal Status:           Bedside US with visible FHR, CRL c/w LMP dates  General:  Alert, oriented and cooperative. Patient is in no acute distress.  Skin: Skin is warm and dry. No rash noted.   Cardiovascular: Normal heart rate noted  Respiratory: Normal respiratory effort, no problems with respiration noted  Abdomen: Soft, gravid, appropriate for gestational age.  Pain/Pressure: Absent     Pelvic: Cervical exam deferred        Extremities: Normal range of motion.  Edema: None  Mental Status: Normal mood and affect. Normal behavior. Normal judgment and thought content.   Assessment and Plan:  Pregnancy: [id number] at [redacted]w[redacted]d  1. Supervision of other normal pregnancy, antepartum --Discussed and offered genetic screening options, including Quad screen/AFP, NIPS testing, and option to decline testing. Benefits/risks/alternatives reviewed. Pt aware that anatomy US is form of genetic screening with lower accuracy in detecting trisomies than blood work.  Pt chooses NIPS for genetic  screening today. At 8.6 weeks, pt is too early for NIPS, will draw all labs at next visit. --Anticipatory guidance about next visits/weeks of pregnancy given. - Cervicovaginal ancillary only - Enroll Patient in Babyscripts - Culture, OB Urine  2. Need for immunization against influenza  - Flu Vaccine QUAD 36+ mos IM  3. Ptyalism  - glycopyrrolate (ROBINUL) 2 MG tablet; Take 1 tablet (2 mg total) by mouth 3 (three) times daily as needed.  Dispense: 30 tablet; Refill: 3  4.  Gonorrhea --Positive on 01/05/18, treated.  TOC today.   Preterm labor symptoms and general obstetric precautions including but not limited to vaginal bleeding, contractions, leaking of fluid and fetal movement were reviewed in detail with the patient. Please refer to After Visit Summary for other counseling recommendations.  No follow-ups on file.  Future Appointments  Date Time Provider Department Center  03/01/2018 10:15 AM [facility]irby, Wilmer Floor, CNM [facility] None    Sharen Counter, CNM

## 2018-02-02 LAB — CERVICOVAGINAL ANCILLARY ONLY
BACTERIAL VAGINITIS: POSITIVE — AB
CANDIDA VAGINITIS: NEGATIVE
CHLAMYDIA, DNA PROBE: NEGATIVE
Neisseria Gonorrhea: NEGATIVE
Trichomonas: NEGATIVE

## 2018-02-05 LAB — URINE CULTURE, OB REFLEX

## 2018-02-05 LAB — CULTURE, OB URINE

## 2018-02-08 ENCOUNTER — Telehealth: Payer: Self-pay | Admitting: Advanced Practice Midwife

## 2018-02-08 MED ORDER — METRONIDAZOLE 500 MG PO TABS: 500.0000 mg | ORAL_TABLET | Freq: Two times a day (BID) | ORAL | 0 refills | Status: AC

## 2018-02-08 MED ORDER — CEPHALEXIN 500 MG PO CAPS: 500.0000 mg | ORAL_CAPSULE | Freq: Three times a day (TID) | ORAL | 0 refills | Status: AC

## 2018-02-08 NOTE — Telephone Encounter (Signed)
Left message regarding lab results. Pt vaginal swab negative for gonorrhea as test of cure but positive for BV.  She also has a UTI with positive urine culture.  Pt to take Keflex 500 mg TID x 7 days then complete course of Flagyl 500 mg BID x 7 days.  Rx sent to pt pharmacy.

## 2018-02-08 NOTE — Telephone Encounter (Signed)
Pt returned call. Notified her of UTI with Rx and BV with Rx. Pt to treat UTI, may treat BV now, or not treat if no symptoms.  Pt states understanding.

## 2018-03-01 ENCOUNTER — Ambulatory Visit (INDEPENDENT_AMBULATORY_CARE_PROVIDER_SITE_OTHER): Payer: 59 | Admitting: Advanced Practice Midwife

## 2018-03-01 ENCOUNTER — Encounter: Payer: Self-pay | Admitting: Advanced Practice Midwife

## 2018-03-01 VITALS — BP 119/69 | HR 90 | Wt 237.0 lb

## 2018-03-01 DIAGNOSIS — O09219 Supervision of pregnancy with history of pre-term labor, unspecified trimester: Secondary | ICD-10-CM

## 2018-03-01 DIAGNOSIS — O09899 Supervision of other high risk pregnancies, unspecified trimester: Secondary | ICD-10-CM

## 2018-03-01 DIAGNOSIS — R519 Headache, unspecified: Secondary | ICD-10-CM

## 2018-03-01 DIAGNOSIS — R102 Pelvic and perineal pain unspecified side: Secondary | ICD-10-CM

## 2018-03-01 DIAGNOSIS — O26899 Other specified pregnancy related conditions, unspecified trimester: Secondary | ICD-10-CM

## 2018-03-01 DIAGNOSIS — O0991 Supervision of high risk pregnancy, unspecified, first trimester: Secondary | ICD-10-CM

## 2018-03-01 DIAGNOSIS — O26891 Other specified pregnancy related conditions, first trimester: Secondary | ICD-10-CM

## 2018-03-01 DIAGNOSIS — O0990 Supervision of high risk pregnancy, unspecified, unspecified trimester: Secondary | ICD-10-CM

## 2018-03-01 DIAGNOSIS — O09211 Supervision of pregnancy with history of pre-term labor, first trimester: Secondary | ICD-10-CM

## 2018-03-01 DIAGNOSIS — R51 Headache: Secondary | ICD-10-CM

## 2018-03-01 MED ORDER — COMFORT FIT MATERNITY SUPP MED MISC: 1.0000 | Freq: Every day | 0 refills | Status: DC

## 2018-03-01 NOTE — Patient Instructions (Addendum)
° °  PREGNANCY SUPPORT BELT: °You are not alone, Seventy-five percent of women have some sort of abdominal or back pain at some point in their pregnancy. Your baby is growing at a fast pace, which means that your whole body is rapidly trying to adjust to the changes. As your uterus grows, your back may start feeling a bit under stress and this can result in back or abdominal pain that can go from mild, and therefore bearable, to severe pains that will not allow you to sit or lay down comfortably, When it comes to dealing with pregnancy-related pains and cramps, some pregnant women usually prefer natural remedies, which the market is filled with nowadays. For example, wearing a pregnancy support belt can help ease and lessen your discomfort and pain. °WHAT ARE THE BENEFITS OF WEARING A PREGNANCY SUPPORT BELT? A pregnancy support belt provides support to the lower portion of the belly taking some of the weight of the growing uterus and distributing to the other parts of your body. It is designed make you comfortable and gives you extra support. Over the years, the pregnancy apparel market has been studying the needs and wants of pregnant women and they have come up with the most comfortable pregnancy support belts that woman could ever ask for. In fact, you will no longer have to wear a stretched-out or bulky pregnancy belt that is visible underneath your clothes and makes you feel even more uncomfortable. Nowadays, a pregnancy support belt is made of comfortable and stretchy materials that will not irritate your skin but will actually make you feel at ease and you will not even notice you are wearing it. They are easy to put on and adjust during the day and can be worn at night for additional support.  °BENEFITS: °• Relives Back pain °• Relieves Abdominal Muscle and Leg Pain °• Stabilizes the Pelvic Ring °• Offers a Cushioned Abdominal Lift Pad °• Relieves pressure on the Sciatic Nerve Within Minutes °WHERE TO GET  YOUR PREGNANCY BELT: [employer] [phone] @[address] [place],  [phone] ° ° °

## 2018-03-01 NOTE — Progress Notes (Signed)
   PRENATAL VISIT NOTE  Subjective:  Holly Brown is a 32 y.o. [phone] at [redacted]w[redacted]d being seen today for ongoing prenatal care.  She is currently monitored for the following issues for this low-risk pregnancy and has Syncope; Heart palpitations; Family history of colon cancer; Gonorrhea; Cervical high risk HPV (human papillomavirus) test positive; Supervision of high risk pregnancy, antepartum; and History of preterm delivery, currently pregnant on their problem list.  Patient reports headache and intermittent abdominal pain.  Contractions: Not present. Vag. Bleeding: None.  Movement: Absent. Denies leaking of fluid.   The following portions of the patient's history were reviewed and updated as appropriate: allergies, current medications, past family history, past medical history, past social history, past surgical history and problem list. Problem list updated.  Objective:   Vitals:   03/01/18 1025  BP: 119/69  Pulse: 90  Weight: 107.5 kg    Fetal Status:     Movement: Absent     General:  Alert, oriented and cooperative. Patient is in no acute distress.  Skin: Skin is warm and dry. No rash noted.   Cardiovascular: Normal heart rate noted  Respiratory: Normal respiratory effort, no problems with respiration noted  Abdomen: Soft, gravid, appropriate for gestational age.  Pain/Pressure: Present     Pelvic: Cervical exam deferred        Extremities: Normal range of motion.  Edema: None  Mental Status: Normal mood and affect. Normal behavior. Normal judgment and thought content.   Assessment and Plan:  Pregnancy: [id number] at [redacted]w[redacted]d  1. Supervision of high risk pregnancy, antepartum --Anticipatory guidance about next visits/weeks of pregnancy given.  2. History of preterm delivery, currently pregnant --Previous 36 week delivery --17-P discussed. Pt undecided.  3. Pain of round ligament affecting pregnancy, antepartum --Bilateral groin pain when changing positions in bed or after  walking long distances --Rest/ice/heat/warm bath/Tylenol/pregnancy support belt as pregnancy progresses - Elastic Bandages & Supports (COMFORT FIT MATERNITY SUPP MED) MISC; 1 Device by Does not apply route daily.  Dispense: 1 each; Refill: 0  4. Nonintractable episodic headache, unspecified headache type --Pt with weekly headaches, resolved by Tylenol and rest but she does not like to take medicine. Tylenol makes her sleepy so she does not take it at work.   --Elevated BP at work, 140s/90s.  Only checks when she has a h/a. Today she is 119/69.   --Check BP at home/work when she does not have pain to compare. BP check in 2 weeks in office.  --Increase PO fluids, take Tylenol at earlier onset of h/a. --Offered Fioricet or Flexeril but pt declines, does not want to take medicine and cannot take at work.   Preterm labor symptoms and general obstetric precautions including but not limited to vaginal bleeding, contractions, leaking of fluid and fetal movement were reviewed in detail with the patient. Please refer to After Visit Summary for other counseling recommendations.  Return in about 2 weeks (around 03/15/2018).  Future Appointments  Date Time Provider Department Center  03/16/2018 10:30 AM [facility] NURSE [facility] None  03/29/2018 10:00 AM Leftwich-Kirby, Wilmer Floor, CNM [facility] None    Sharen Counter, CNM

## 2018-03-01 NOTE — Progress Notes (Signed)
Pt complains of cramping in pelvic area and notices that it may be hard to walk  Pt states she has been checking B/P at work and it has been elevated Denies no any edema, no visual changes  Notes HA daily take tylenol for relief.

## 2018-03-04 ENCOUNTER — Inpatient Hospital Stay (HOSPITAL_COMMUNITY): Payer: 59

## 2018-03-04 ENCOUNTER — Inpatient Hospital Stay (HOSPITAL_COMMUNITY)
Admission: AD | Admit: 2018-03-04 | Discharge: 2018-03-04 | Disposition: A | Discharge status: Home / Self Care | Payer: 59 | Source: Ambulatory Visit | Attending: Obstetrics & Gynecology | Admitting: Obstetrics & Gynecology

## 2018-03-04 ENCOUNTER — Encounter (HOSPITAL_COMMUNITY): Payer: Self-pay

## 2018-03-04 DIAGNOSIS — O99611 Diseases of the digestive system complicating pregnancy, first trimester: Secondary | ICD-10-CM | POA: Insufficient documentation

## 2018-03-04 DIAGNOSIS — O2341 Unspecified infection of urinary tract in pregnancy, first trimester: Secondary | ICD-10-CM | POA: Diagnosis not present

## 2018-03-04 DIAGNOSIS — R1011 Right upper quadrant pain: Secondary | ICD-10-CM | POA: Diagnosis not present

## 2018-03-04 DIAGNOSIS — I959 Hypotension, unspecified: Secondary | ICD-10-CM | POA: Diagnosis not present

## 2018-03-04 DIAGNOSIS — O219 Vomiting of pregnancy, unspecified: Secondary | ICD-10-CM

## 2018-03-04 DIAGNOSIS — R1084 Generalized abdominal pain: Secondary | ICD-10-CM | POA: Diagnosis not present

## 2018-03-04 DIAGNOSIS — Z87891 Personal history of nicotine dependence: Secondary | ICD-10-CM | POA: Insufficient documentation

## 2018-03-04 DIAGNOSIS — Z3A13 13 weeks gestation of pregnancy: Secondary | ICD-10-CM

## 2018-03-04 DIAGNOSIS — R112 Nausea with vomiting, unspecified: Secondary | ICD-10-CM | POA: Diagnosis not present

## 2018-03-04 DIAGNOSIS — R12 Heartburn: Secondary | ICD-10-CM | POA: Insufficient documentation

## 2018-03-04 DIAGNOSIS — O26891 Other specified pregnancy related conditions, first trimester: Secondary | ICD-10-CM | POA: Diagnosis not present

## 2018-03-04 DIAGNOSIS — R110 Nausea: Secondary | ICD-10-CM | POA: Diagnosis not present

## 2018-03-04 DIAGNOSIS — R52 Pain, unspecified: Secondary | ICD-10-CM | POA: Diagnosis not present

## 2018-03-04 LAB — CBC
HCT: 37.6 % (ref 36.0–46.0)
HEMOGLOBIN: 12.4 g/dL (ref 12.0–15.0)
MCH: 30 pg (ref 26.0–34.0)
MCHC: 33 g/dL (ref 30.0–36.0)
MCV: 90.8 fL (ref 80.0–100.0)
Platelets: 374 10*3/uL (ref 150–400)
RBC: 4.14 MIL/uL (ref 3.87–5.11)
RDW: 13 % (ref 11.5–15.5)
WBC: 15.1 10*3/uL — ABNORMAL HIGH (ref 4.0–10.5)
nRBC: 0 % (ref 0.0–0.2)

## 2018-03-04 LAB — URINALYSIS, ROUTINE W REFLEX MICROSCOPIC
Bilirubin Urine: NEGATIVE
Hgb urine dipstick: NEGATIVE
KETONES UR: 20 mg/dL — AB
Leukocytes, UA: NEGATIVE
Nitrite: POSITIVE — AB
PH: 7 (ref 5.0–8.0)
Protein, ur: NEGATIVE mg/dL
SPECIFIC GRAVITY, URINE: 1.02 (ref 1.005–1.030)

## 2018-03-04 LAB — COMPREHENSIVE METABOLIC PANEL
ALBUMIN: 3.6 g/dL (ref 3.5–5.0)
ALT: 28 U/L (ref 0–44)
AST: 23 U/L (ref 15–41)
Alkaline Phosphatase: 62 U/L (ref 38–126)
Anion gap: 11 (ref 5–15)
BUN: 9 mg/dL (ref 6–20)
CO2: 21 mmol/L — AB (ref 22–32)
CREATININE: 0.83 mg/dL (ref 0.44–1.00)
Calcium: 9.3 mg/dL (ref 8.9–10.3)
Chloride: 104 mmol/L (ref 98–111)
GFR calc Af Amer: >60 mL/min (ref >60)
Glucose, Bld: 124 mg/dL — ABNORMAL HIGH (ref 70–99)
POTASSIUM: 3.7 mmol/L (ref 3.5–5.1)
SODIUM: 136 mmol/L (ref 135–145)
Total Bilirubin: 0.6 mg/dL (ref 0.3–1.2)
Total Protein: 7.7 g/dL (ref 6.5–8.1)

## 2018-03-04 LAB — LIPASE, BLOOD: Lipase: 24 U/L (ref 11–51)

## 2018-03-04 MED ORDER — METOCLOPRAMIDE HCL 5 MG/ML IJ SOLN
10.0000 mg | Freq: Once | INTRAMUSCULAR | Status: AC
  Administered 2018-03-04: 10 mg via INTRAVENOUS
  Filled 2018-03-04: qty 2

## 2018-03-04 MED ORDER — CEPHALEXIN 500 MG PO CAPS: 500.0000 mg | ORAL_CAPSULE | Freq: Four times a day (QID) | ORAL | 0 refills | Status: DC

## 2018-03-04 MED ORDER — FAMOTIDINE IN NACL 20-0.9 MG/50ML-% IV SOLN
20.0000 mg | Freq: Once | INTRAVENOUS | Status: AC
  Administered 2018-03-04: 20 mg via INTRAVENOUS
  Filled 2018-03-04: qty 50

## 2018-03-04 MED ORDER — LACTATED RINGERS IV BOLUS
1000.0000 mL | Freq: Once | INTRAVENOUS | Status: AC
  Administered 2018-03-04: 1000 mL via INTRAVENOUS

## 2018-03-04 MED ORDER — PROMETHAZINE HCL 25 MG/ML IJ SOLN
12.5000 mg | Freq: Four times a day (QID) | INTRAMUSCULAR | Status: DC | PRN
  Administered 2018-03-04: 12.5 mg via INTRAVENOUS
  Filled 2018-03-04: qty 1

## 2018-03-04 MED ORDER — MORPHINE SULFATE (PF) 4 MG/ML IV SOLN
4.0000 mg | Freq: Once | INTRAVENOUS | Status: AC
  Administered 2018-03-04: 4 mg via INTRAVENOUS
  Filled 2018-03-04: qty 1

## 2018-03-04 MED ORDER — PROMETHAZINE HCL 25 MG PO TABS: 25.0000 mg | ORAL_TABLET | Freq: Four times a day (QID) | ORAL | 0 refills | Status: DC | PRN

## 2018-03-04 MED ORDER — FAMOTIDINE 20 MG PO TABS: 20.0000 mg | ORAL_TABLET | Freq: Two times a day (BID) | ORAL | 0 refills | Status: DC

## 2018-03-04 NOTE — MAU Note (Signed)
Presents via EMS with upper right sided abdominal pain.  Patient is writhing in the bed.  No VB or discharge.  Hasn't taken anything for the pain.  Pain came on suddenly.

## 2018-03-04 NOTE — MAU Provider Note (Addendum)
History    CSN: [id number] Arrival date and time: 03/04/18 0425  Chief Complaint  Patient presents with  . Abdominal Pain   HPI 32yo [id number] at 2938w2d who presents from EMS with epigastric and RUQ abdominal pain, nausea and vomiting, and diarrhea. Per EMS report, she woke up about 4 hours prior to arrival with pain and had two episodes of non-bilious emesis. She reports some bloody streaks with most recent episode. Has had nausea off/on the beginning of her pregnancy but denies ever having pain like this. States had diarrhea yesterday, no blood in stool.   Denies vaginal bleeding, vaginal discharge. Denies any urinary symptoms. Denies history of any abdominal surgeries. Denies fever, sick contacts, recent travel.    OB History    Gravida  5   Para  3   Term  2   Preterm  1   AB  1   Living  3     SAB  1   TAB      Ectopic      Multiple      Live Births  3           Past Medical History:  Diagnosis Date  . Migraines   . Seizures (HCC)    Jan.2017    Past Surgical History:  Procedure Laterality Date  . DILATION AND CURETTAGE OF UTERUS    . WISDOM TOOTH EXTRACTION  2011    Family History  Problem Relation Age of Onset  . Colon cancer Father   . Hypertension Maternal Grandmother   . Diabetes Maternal Grandmother   . Hypertension Paternal Grandmother   . Diabetes Mother     Social History   Tobacco Use  . Smoking status: Former Smoker    Packs/day: 0.25    Types: Cigarettes    Start date: 12/21/2011  . Smokeless tobacco: Never Used  Substance Use Topics  . Alcohol use: Not Currently    Alcohol/week: 0.0 standard drinks    Comment: ocassionally   . Drug use: No   Allergies: No Known Allergies  Medications Prior to Admission  Medication Sig Dispense Refill Last Dose  . Elastic Bandages & Supports (COMFORT FIT MATERNITY SUPP MED) MISC 1 Device by Does not apply route daily. 1 each 0   . glycopyrrolate (ROBINUL) 2 MG tablet Take 1 tablet (2 mg  total) by mouth 3 (three) times daily as needed. 30 tablet 3    *Somewhat limited by patient discomfort, participation* Review of Systems  Constitutional: Negative for appetite change and fever.  Respiratory: Negative for shortness of breath.   Cardiovascular: Negative for chest pain and leg swelling.  Gastrointestinal: Positive for abdominal pain, diarrhea, nausea and vomiting. Negative for blood in stool.  Genitourinary: Negative for difficulty urinating, dysuria, flank pain, pelvic pain, urgency, vaginal bleeding, vaginal discharge and vaginal pain.  Musculoskeletal: Negative for back pain.  Neurological: Negative for light-headedness and headaches.    Physical Exam   Blood pressure (!) 111/45, pulse 73, temperature 98.8 F (37.1 C), temperature source Oral, resp. rate 20, last menstrual period 11/20/2017, SpO2 100 %.  Physical Exam  Nursing note and vitals reviewed. Constitutional: She is oriented to person, place, and time. She appears well-developed and well-nourished. She appears distressed (uncomfortable appearing, lying on right side in fetal position and writhing around).  HENT:  Head: Normocephalic and atraumatic.  Eyes: Conjunctivae and EOM are normal. No scleral icterus.  Cardiovascular: Normal rate, regular rhythm, normal heart sounds and intact distal pulses.  No  murmur heard. Respiratory: Effort normal and breath sounds normal. She has no wheezes.  GI: Soft. Bowel sounds are normal. She exhibits no distension. There is tenderness (RUQ, epigastric). There is guarding (+) Murphy's sign. There is no rebound.  Musculoskeletal: She exhibits no edema.  Neurological: She is alert and oriented to person, place, and time.  Skin: Skin is warm and dry. No rash noted.  Psychiatric:  Tearful, moaning difficult to redirect   MAU Course  Procedures Results for orders placed or performed during the hospital encounter of 03/04/18 (from the past 24 hour(s))  CBC     Status: Abnormal    Collection Time: 03/04/18  4:56 AM  Result Value Ref Range   WBC 15.1 (H) 4.0 - 10.5 K/uL   RBC 4.14 3.87 - 5.11 MIL/uL   Hemoglobin 12.4 12.0 - 15.0 g/dL   HCT 16.1 09.6 - 04.5 %   MCV 90.8 80.0 - 100.0 fL   MCH 30.0 26.0 - 34.0 pg   MCHC 33.0 30.0 - 36.0 g/dL   RDW 40.9 81.1 - 91.4 %   Platelets 374 150 - 400 K/uL   nRBC 0.0 0.0 - 0.2 %  Comprehensive metabolic panel     Status: Abnormal   Collection Time: 03/04/18  4:56 AM  Result Value Ref Range   Sodium 136 135 - 145 mmol/L   Potassium 3.7 3.5 - 5.1 mmol/L   Chloride 104 98 - 111 mmol/L   CO2 21 (L) 22 - 32 mmol/L   Glucose, Bld 124 (H) 70 - 99 mg/dL   BUN 9 6 - 20 mg/dL   Creatinine, Ser 7.82 0.44 - 1.00 mg/dL   Calcium 9.3 8.9 - 95.6 mg/dL   Total Protein 7.7 6.5 - 8.1 g/dL   Albumin 3.6 3.5 - 5.0 g/dL   AST 23 15 - 41 U/L   ALT 28 0 - 44 U/L   Alkaline Phosphatase 62 38 - 126 U/L   Total Bilirubin 0.6 0.3 - 1.2 mg/dL   GFR calc non Af Amer >60 >60 mL/min   GFR calc Af Amer >60 >60 mL/min   Anion gap 11 5 - 15  Lipase, blood     Status: None   Collection Time: 03/04/18  4:56 AM  Result Value Ref Range   Lipase 24 11 - 51 U/L  Urinalysis, Routine w reflex microscopic     Status: Abnormal   Collection Time: 03/04/18  7:18 AM  Result Value Ref Range   Color, Urine YELLOW YELLOW   APPearance HAZY (A) CLEAR   Specific Gravity, Urine 1.020 1.005 - 1.030   pH 7.0 5.0 - 8.0   Glucose, UA >=500 (A) NEGATIVE mg/dL   Hgb urine dipstick NEGATIVE NEGATIVE   Bilirubin Urine NEGATIVE NEGATIVE   Ketones, ur 20 (A) NEGATIVE mg/dL   Protein, ur NEGATIVE NEGATIVE mg/dL   Nitrite POSITIVE (A) NEGATIVE   Leukocytes, UA NEGATIVE NEGATIVE   RBC / HPF 0-5 0 - 5 RBC/hpf   WBC, UA 0-5 0 - 5 WBC/hpf   Bacteria, UA MANY (A) NONE SEEN   Mucus PRESENT    US Abdomen Limited  Result Date: 03/04/2018 CLINICAL DATA:  RIGHT upper quadrant pain. Pregnant, 13 weeks and 2 days. EXAM: ULTRASOUND ABDOMEN LIMITED RIGHT UPPER QUADRANT  COMPARISON:  None. FINDINGS: Gallbladder: No gallstones or wall thickening visualized. No sonographic Murphy sign noted by sonographer. Common bile duct: Diameter: 3 mm. Liver: No focal lesion identified. Within normal limits in parenchymal echogenicity. Portal vein  is patent on color Doppler imaging with normal direction of blood flow towards the liver. IMPRESSION: Normal RIGHT upper quadrant ultrasound. Electronically Signed   By: Awilda Metro M.D.   On: 03/04/2018 06:12    MDM -- Very uncomfortable, laying on right side and writhing around in pain, states unable to lay on back because of pain. DDX includes cholecystitis, gallstones, GERD, gas pain, kidney stones (recent UTI). Has had confirmed IUP this pregnancy.  -- FHTs doppler 150s  -- Will obtain CBC, CMP, lipase  morphine IV for pain  RUQ U/S  U/A + culture  1L LR bolus  -- leukocytosis on CBC with left shift  stable H/H  -- CMP, lipase wnl  -- pain improved with morphine, nausea subsided with pain control  -- discussed results with patient - US wnl, now sleeping, will attempt U/A once fluids finish  -- still vomiting about an hour after phenergan, will try IV pepcid and reglan   Signed out patient to [employer]. Just awaiting urine results.  Rechecked on patient and sleeping, family in room.   Cristal Deer. Earlene Plater, DO OB/GYN Fellow   U/a + nitrites. Pt states she completed abx for last UTI. Denies urinary complaints, fever, or flank pain. No CVAT on exam & pt afebrile in MAU. Will d/c home with keflex. Urine cx in process Assessment and Plan  A:  1. Nausea and vomiting during pregnancy prior to [redacted] weeks gestation   2. RUQ pain   3. [redacted] weeks gestation of pregnancy   4. Heartburn during pregnancy in first trimester   5. UTI (urinary tract infection) during pregnancy, first trimester    P: Discharge home Rx phenergan, pepcid, keflex Urine culture pending Discussed reasons to return to MAU  Judeth Horn, NP

## 2018-03-04 NOTE — Discharge Instructions (Signed)
Abdominal Pain, Adult Many things can cause belly (abdominal) pain. Most times, belly pain is not dangerous. Many cases of belly pain can be watched and treated at home. Sometimes belly pain is serious, though. Your doctor will try to find the cause of your belly pain. Follow these instructions at home:  Take over-the-counter and prescription medicines only as told by your doctor. Do not take medicines that help you poop (laxatives) unless told to by your doctor.  Drink enough fluid to keep your pee (urine) clear or pale yellow.  Watch your belly pain for any changes.  Keep all follow-up visits as told by your doctor. This is important. Contact a doctor if:  Your belly pain changes or gets worse.  You are not hungry, or you lose weight without trying.  You are having trouble pooping (constipated) or have watery poop (diarrhea) for more than 2-3 days.  You have pain when you pee or poop.  Your belly pain wakes you up at night.  Your pain gets worse with meals, after eating, or with certain foods.  You are throwing up and cannot keep anything down.  You have a fever. Get help right away if:  Your pain does not go away as soon as your doctor says it should.  You cannot stop throwing up.  Your pain is only in areas of your belly, such as the right side or the left lower part of the belly.  You have bloody or black poop, or poop that looks like tar.  You have very bad pain, cramping, or bloating in your belly.  You have signs of not having enough fluid or water in your body (dehydration), such as: ? Dark pee, very little pee, or no pee. ? Cracked lips. ? Dry mouth. ? Sunken eyes. ? Sleepiness. ? Weakness. This information is not intended to replace advice given to you by your health care provider. Make sure you discuss any questions you have with your health care provider. Document Released: 09/24/2007 Document Revised: 10/26/2015 Document Reviewed: 09/19/2015 Elsevier  Interactive Patient Education  2018 Else[employer] Inc[employer] Pregnancy and Urinary Tract Infection What is a urinary tract infection? A urinary tract infection (UTI) is an infection of any part of the urinary tract. This includes the kidneys, the tubes that connect your kidneys to your bladder (ureters), the bladder, and the tube that carries urine out of your body (urethra). These organs make, store, and get rid of urine in the body. A UTI can be a bladder infection (cystitis) or a kidney infection (pyelonephritis). This infection may be caused by fungi, viruses, and bacteria. Bacteria are the most common cause of UTIs. You are more likely to develop a UTI during pregnancy because:  The physical and hormonal changes your body goes through can make it easier for bacteria to get into your urinary tract.  Your growing baby puts pressure on your uterus and can affect urine flow.  Does a UTI place my baby at risk? An untreated UTI during pregnancy could lead to a kidney infection, which can cause health problems that could affect your baby. Possible complications of an untreated UTI include:  Having your baby before 37 weeks of pregnancy (premature).  Having a baby with a low birth weight.  Developing high blood pressure during pregnancy (preeclampsia).  What are the symptoms of a UTI? Symptoms of a UTI include:  Fever.  Frequent urination or passing small amounts of urine frequently.  Needing to urinate urgently.  Pain or  a burning sensation with urination.  Urine that smells bad or unusual.  Cloudy urine.  Pain in the lower abdomen or back.  Trouble urinating.  Blood in the urine.  Vomiting or being less hungry than normal.  Diarrhea or abdominal pain.  Vaginal discharge.  What are the treatment options for a UTI during pregnancy? Treatment for this condition may include:  Antibiotic medicines that are safe to take during pregnancy.  Other medicines to treat less common causes  of UTI.  How can I prevent a UTI?  To prevent a UTI:  Go to the bathroom as soon as you feel the need.  Always wipe from front to back.  Wash your genital area with soap and warm water daily.  Empty your bladder before and after sex.  Wear cotton underwear.  Limit your intake of high sugar foods or drinks, such as regular soda, juice, and sweets.  Drink 6-8 glasses of water daily.  Do not wear tight-fitting pants.  Do not douche or use deodorant sprays.  Do not drink alcohol, caffeine, or carbonated drinks. These can irritate the bladder.  Contact a health care provider if:  Your symptoms do not improve or get worse.  You have a fever after two days of treatment.  You have a rash.  You have abnormal vaginal discharge.  You have back or side pain.  You have chills.  You have nausea and vomiting. Get help right away if: Seek immediate medical care if you are pregnant and:  You feel contractions in your uterus.  You have lower belly pain.  You have a gush of fluid from your vagina.  You have blood in your urine.  You are vomiting and cannot keep down any medicines or water.  This information is not intended to replace advice given to you by your health care provider. Make sure you discuss any questions you have with your health care provider. Document Released: 08/02/2010 Document Revised: 03/21/2016 Document Reviewed: 02/26/2015 Elsevier Interactive Patient Education  2017 [employer].

## 2018-03-06 LAB — CULTURE, OB URINE

## 2018-03-07 ENCOUNTER — Other Ambulatory Visit: Payer: Self-pay | Admitting: Student

## 2018-03-07 DIAGNOSIS — O2341 Unspecified infection of urinary tract in pregnancy, first trimester: Secondary | ICD-10-CM | POA: Insufficient documentation

## 2018-03-16 ENCOUNTER — Ambulatory Visit (INDEPENDENT_AMBULATORY_CARE_PROVIDER_SITE_OTHER): Payer: 59

## 2018-03-16 VITALS — BP 123/76 | HR 89 | Wt 243.9 lb

## 2018-03-16 DIAGNOSIS — O0990 Supervision of high risk pregnancy, unspecified, unspecified trimester: Secondary | ICD-10-CM

## 2018-03-16 DIAGNOSIS — O0992 Supervision of high risk pregnancy, unspecified, second trimester: Secondary | ICD-10-CM

## 2018-03-16 NOTE — Progress Notes (Signed)
Presents for BP check.  BP 123/76  P 89  Subjective:  Holly Brown is a 32 y.o. female here for BP check.   Hypertension ROS: No headaches, no dizziness, no heart palpitation, no swelling in ankles.    Objective:  LMP 11/20/2017 Comment: states it stopped and came back a couple times.  Appearance alert, well appearing, and in no distress. General exam BP noted to be well controlled today in office.    Assessment:   Blood Pressure well controlled, denies headaches.   Plan:  Current treatment plan is effective, no change in therapy..Marland Kitchen

## 2018-03-29 ENCOUNTER — Ambulatory Visit (INDEPENDENT_AMBULATORY_CARE_PROVIDER_SITE_OTHER): Payer: 59 | Admitting: Advanced Practice Midwife

## 2018-03-29 ENCOUNTER — Encounter: Payer: Self-pay | Admitting: Advanced Practice Midwife

## 2018-03-29 VITALS — BP 120/75 | HR 92 | Wt 240.0 lb

## 2018-03-29 DIAGNOSIS — O219 Vomiting of pregnancy, unspecified: Secondary | ICD-10-CM

## 2018-03-29 DIAGNOSIS — O26899 Other specified pregnancy related conditions, unspecified trimester: Secondary | ICD-10-CM

## 2018-03-29 DIAGNOSIS — O2341 Unspecified infection of urinary tract in pregnancy, first trimester: Secondary | ICD-10-CM

## 2018-03-29 DIAGNOSIS — O2342 Unspecified infection of urinary tract in pregnancy, second trimester: Secondary | ICD-10-CM

## 2018-03-29 DIAGNOSIS — O26892 Other specified pregnancy related conditions, second trimester: Secondary | ICD-10-CM

## 2018-03-29 DIAGNOSIS — O0990 Supervision of high risk pregnancy, unspecified, unspecified trimester: Secondary | ICD-10-CM

## 2018-03-29 DIAGNOSIS — O0992 Supervision of high risk pregnancy, unspecified, second trimester: Secondary | ICD-10-CM

## 2018-03-29 DIAGNOSIS — R102 Pelvic and perineal pain: Secondary | ICD-10-CM

## 2018-03-29 MED ORDER — CEFTRIAXONE SODIUM 250 MG IJ SOLR
250.0000 mg | Freq: Once | INTRAMUSCULAR | Status: AC
  Administered 2018-03-29: 250 mg via INTRAMUSCULAR

## 2018-03-29 MED ORDER — ONDANSETRON HCL 4 MG PO TABS: 4.0000 mg | ORAL_TABLET | Freq: Three times a day (TID) | ORAL | 0 refills | Status: DC | PRN

## 2018-03-29 MED ORDER — CYCLOBENZAPRINE HCL 10 MG PO TABS: 5.0000 mg | ORAL_TABLET | Freq: Three times a day (TID) | ORAL | 0 refills | Status: DC | PRN

## 2018-03-29 MED ORDER — SULFAMETHOXAZOLE-TRIMETHOPRIM 800-160 MG PO TABS: 1.0000 | ORAL_TABLET | Freq: Two times a day (BID) | ORAL | 0 refills | Status: AC

## 2018-03-29 NOTE — Progress Notes (Signed)
PRENATAL VISIT NOTE  Subjective:  Holly Brown is a 32 y.o. [id number] at 7520w6d being seen today for ongoing prenatal care.  She is currently monitored for the following issues for this low-risk pregnancy and has Syncope; Heart palpitations; Family history of colon cancer; Gonorrhea; Cervical high risk HPV (human papillomavirus) test positive; Supervision of high risk pregnancy, antepartum; History of preterm delivery, currently pregnant; and Urinary tract infection affecting care of mother in first trimester, antepartum on their problem list.  Patient reports pelvic pain, sharp shooting RLQ pain with movement.  Contractions: Not present. Vag. Bleeding: None.  Movement: Absent. Denies leaking of fluid.   The following portions of the patient's history were reviewed and updated as appropriate: allergies, current medications, past family history, past medical history, past social history, past surgical history and problem list. Problem list updated.  Objective:   Vitals:   03/29/18 1018  BP: 120/75  Pulse: 92  Weight: 108.9 kg    Fetal Status: Fetal Heart Rate (bpm): 138   Movement: Absent     General:  Alert, oriented and cooperative. Patient is in no acute distress.  Skin: Skin is warm and dry. No rash noted.   Cardiovascular: Normal heart rate noted  Respiratory: Normal respiratory effort, no problems with respiration noted  Abdomen: Soft, gravid, appropriate for gestational age.  Pain/Pressure: Present     Pelvic: Cervical exam deferred        Extremities: Normal range of motion.  Edema: None  Mental Status: Normal mood and affect. Normal behavior. Normal judgment and thought content.   Assessment and Plan:  Pregnancy: [id number] at 8520w6d  1. Supervision of high risk pregnancy, antepartum --Anticipatory guidance about next visits/weeks of pregnancy given. --Prenatal panel and genetic screening labs drawn today, too early at initial OB.  - Obstetric Panel, Including HIV - AFP,  Serum, Open Spina Bifida - Genetic Screening - Hemoglobinopathy evaluation - Cystic Fibrosis Mutation 97 - SMN1 COPY NUMBER ANALYSIS (SMA Carrier Screen) - [facility] AMB BABYSCRIPTS OPT IN - US MFM OB COMP + 14 WK; Future  2. Urinary tract infection affecting care of mother in first trimester, antepartum --Pt treated x 2 for UTI but reports today she was unable to keep down all the abx each time.  Likely one UTI, untreated vs repetitive infections requiring suppression.  --Will treat today with Rocephin IM and course of Bactrim DS.   --F/U appt in 2 weeks - Culture, OB Urine - cefTRIAXone (ROCEPHIN) injection 250 mg - sulfamethoxazole-trimethoprim (BACTRIM DS,SEPTRA DS) 800-160 MG tablet; Take 1 tablet by mouth 2 (two) times daily for 7 days.  Dispense: 14 tablet; Refill: 0  3. Nausea and vomiting during pregnancy prior to [redacted] weeks gestation  - ondansetron (ZOFRAN) 4 MG tablet; Take 1 tablet (4 mg total) by mouth every 8 (eight) hours as needed for nausea or vomiting.  Dispense: 20 tablet; Refill: 0  4. Pain of round ligament affecting pregnancy, antepartum --Persistent pain in RLQ, sudden sharp pain with movement, especially standing up, not resolved with comfort measures. Pt drinking enough water per her report. --Rest/ice/heat/warm bath/Tylenol/pregnancy support belt (to start ~ 20 weeks)  --Add Rx for Flexeril 5-10 mg TID PRN.  Pt to take at night, may not be able to take at work.    Preterm labor symptoms and general obstetric precautions including but not limited to vaginal bleeding, contractions, leaking of fluid and fetal movement were reviewed in detail with the patient. Please refer to After Visit Summary for other  counseling recommendations.  Return in about 4 weeks (around 04/26/2018).  Future Appointments  Date Time Provider Department Center  04/15/2018  8:45 AM [facility] [place] 2 [facility]    Sharen Counter, CNM

## 2018-03-29 NOTE — Patient Instructions (Addendum)
Round Ligament Pain The round ligament is a cord of muscle and tissue that helps to support the uterus. It can become a source of pain during pregnancy if it becomes stretched or twisted as the baby grows. The pain usually begins in the second trimester of pregnancy, and it can come and go until the baby is delivered. It is not a serious problem, and it does not cause harm to the baby. Round ligament pain is usually a short, sharp, and pinching pain, but it can also be a dull, lingering, and aching pain. The pain is felt in the lower side of the abdomen or in the groin. It usually starts deep in the groin and moves up to the outside of the hip area. Pain can occur with:  A sudden change in position.  Rolling over in bed.  Coughing or sneezing.  Physical activity.  Follow these instructions at home: Watch your condition for any changes. Take these steps to help with your pain:  When the pain starts, relax. Then try: ? Sitting down. ? Flexing your knees up to your abdomen. ? Lying on your side with one pillow under your abdomen and another pillow between your legs. ? Sitting in a warm bath for 15-20 minutes or until the pain goes away.  Take over-the-counter and prescription medicines only as told by your health care provider.  Move slowly when you sit and stand.  Avoid long walks if they cause pain.  Stop or lessen your physical activities if they cause pain.  Contact a health care provider if:  Your pain does not go away with treatment.  You feel pain in your back that you did not have before.  Your medicine is not helping. Get help right away if:  You develop a fever or chills.  You develop uterine contractions.  You develop vaginal bleeding.  You develop nausea or vomiting.  You develop diarrhea.  You have pain when you urinate. This information is not intended to replace advice given to you by your health care provider. Make sure you discuss any questions you have  with your health care provider. Document Released: 01/15/2008 Document Revised: 09/13/2015 Document Reviewed: 06/14/2014 Elsevier Interactive Patient Education  2018 Elsevier Inc.   Pregnancy and Urinary Tract Infection What is a urinary tract infection? A urinary tract infection (UTI) is an infection of any part of the urinary tract. This includes the kidneys, the tubes that connect your kidneys to your bladder (ureters), the bladder, and the tube that carries urine out of your body (urethra). These organs make, store, and get rid of urine in the body. A UTI can be a bladder infection (cystitis) or a kidney infection (pyelonephritis). This infection may be caused by fungi, viruses, and bacteria. Bacteria are the most common cause of UTIs. You are more likely to develop a UTI during pregnancy because:  The physical and hormonal changes your body goes through can make it easier for bacteria to get into your urinary tract.  Your growing baby puts pressure on your uterus and can affect urine flow.  Does a UTI place my baby at risk? An untreated UTI during pregnancy could lead to a kidney infection, which can cause health problems that could affect your baby. Possible complications of an untreated UTI include:  Having your baby before 37 weeks of pregnancy (premature).  Having a baby with a low birth weight.  Developing high blood pressure during pregnancy (preeclampsia).  What are the symptoms of a UTI?  Symptoms of a UTI include:  Fever.  Frequent urination or passing small amounts of urine frequently.  Needing to urinate urgently.  Pain or a burning sensation with urination.  Urine that smells bad or unusual.  Cloudy urine.  Pain in the lower abdomen or back.  Trouble urinating.  Blood in the urine.  Vomiting or being less hungry than normal.  Diarrhea or abdominal pain.  Vaginal discharge.  What are the treatment options for a UTI during pregnancy? Treatment for  this condition may include:  Antibiotic medicines that are safe to take during pregnancy.  Other medicines to treat less common causes of UTI.  How can I prevent a UTI?  To prevent a UTI:  Go to the bathroom as soon as you feel the need.  Always wipe from front to back.  Wash your genital area with soap and warm water daily.  Empty your bladder before and after sex.  Wear cotton underwear.  Limit your intake of high sugar foods or drinks, such as regular soda, juice, and sweets.  Drink 6-8 glasses of water daily.  Do not wear tight-fitting pants.  Do not douche or use deodorant sprays.  Do not drink alcohol, caffeine, or carbonated drinks. These can irritate the bladder.  Contact a health care provider if:  Your symptoms do not improve or get worse.  You have a fever after two days of treatment.  You have a rash.  You have abnormal vaginal discharge.  You have back or side pain.  You have chills.  You have nausea and vomiting. Get help right away if: Seek immediate medical care if you are pregnant and:  You feel contractions in your uterus.  You have lower belly pain.  You have a gush of fluid from your vagina.  You have blood in your urine.  You are vomiting and cannot keep down any medicines or water.  This information is not intended to replace advice given to you by your health care provider. Make sure you discuss any questions you have with your health care provider. Document Released: 08/02/2010 Document Revised: 03/21/2016 Document Reviewed: 02/26/2015 Elsevier Interactive Patient Education  2017 [employer]

## 2018-03-29 NOTE — Progress Notes (Signed)
Pt presents for ROB c/o pelvic pain making it difficult to walk.  NOB labs and NIPS needed, TOC urine cx due today.

## 2018-03-31 ENCOUNTER — Telehealth: Payer: Self-pay | Admitting: *Deleted

## 2018-03-31 NOTE — Telephone Encounter (Signed)
Received leave paperwork from [employer] via fax. Attempt to contact pt regarding paper, ?intermittent leave or ?maternity leave. LM on VM for pt to call office to discuss.

## 2018-04-01 LAB — OBSTETRIC PANEL, INCLUDING HIV
Antibody Screen: NEGATIVE
BASOS ABS: 0 10*3/uL (ref 0.0–0.2)
BASOS: 0 %
EOS (ABSOLUTE): 0.1 10*3/uL (ref 0.0–0.4)
EOS: 1 %
HEMATOCRIT: 38 % (ref 34.0–46.6)
HIV SCREEN 4TH GENERATION: NONREACTIVE
Hemoglobin: 12.6 g/dL (ref 11.1–15.9)
Hepatitis B Surface Ag: NEGATIVE
Immature Grans (Abs): 0 10*3/uL (ref 0.0–0.1)
Immature Granulocytes: 0 %
LYMPHS: 24 %
Lymphocytes Absolute: 2.3 10*3/uL (ref 0.7–3.1)
MCH: 30 pg (ref 26.6–33.0)
MCHC: 33.2 g/dL (ref 31.5–35.7)
MCV: 91 fL (ref 79–97)
MONOCYTES: 7 %
MONOS ABS: 0.7 10*3/uL (ref 0.1–0.9)
NEUTROS PCT: 68 %
Neutrophils Absolute: 6.4 10*3/uL (ref 1.4–7.0)
PLATELETS: 368 10*3/uL (ref 150–450)
RBC: 4.2 x10E6/uL (ref 3.77–5.28)
RDW: 12.4 % (ref 12.3–15.4)
RPR: NONREACTIVE
Rh Factor: POSITIVE
Rubella Antibodies, IGG: 5.01 index (ref >0.99)
WBC: 9.5 10*3/uL (ref 3.4–10.8)

## 2018-04-01 LAB — HEMOGLOBINOPATHY EVALUATION
HEMOGLOBIN A2 QUANTITATION: 2.7 % (ref 1.8–3.2)
HEMOGLOBIN F QUANTITATION: 0 % (ref 0.0–2.0)
HGB C: 0 %
HGB S: 0 %
HGB VARIANT: 0 %
Hgb A: 97.3 % (ref 96.4–98.8)

## 2018-04-01 LAB — URINE CULTURE, OB REFLEX

## 2018-04-01 LAB — CULTURE, OB URINE

## 2018-04-06 ENCOUNTER — Encounter (HOSPITAL_COMMUNITY): Payer: Self-pay

## 2018-04-07 ENCOUNTER — Encounter: Payer: Self-pay | Admitting: Advanced Practice Midwife

## 2018-04-07 LAB — AFP, SERUM, OPEN SPINA BIFIDA
AFP MOM: 2.21
AFP VALUE AFPOSL: 68.5 ng/mL
Gest. Age on Collection Date: 16.9 weeks
Maternal Age At EDD: 30.7 yr
OSBR Risk 1 IN: 1025
Test Results:: NEGATIVE
Weight: 240 [lb_av]

## 2018-04-07 LAB — CYSTIC FIBROSIS MUTATION 97: Interpretation: NOT DETECTED

## 2018-04-07 LAB — SMN1 COPY NUMBER ANALYSIS (SMA CARRIER SCREENING)

## 2018-04-08 ENCOUNTER — Encounter: Payer: Self-pay | Admitting: Advanced Practice Midwife

## 2018-04-12 ENCOUNTER — Ambulatory Visit (INDEPENDENT_AMBULATORY_CARE_PROVIDER_SITE_OTHER): Payer: 59 | Admitting: Advanced Practice Midwife

## 2018-04-12 VITALS — BP 107/71 | HR 85 | Wt 247.0 lb

## 2018-04-12 DIAGNOSIS — O26892 Other specified pregnancy related conditions, second trimester: Secondary | ICD-10-CM

## 2018-04-12 DIAGNOSIS — O2341 Unspecified infection of urinary tract in pregnancy, first trimester: Secondary | ICD-10-CM

## 2018-04-12 DIAGNOSIS — R102 Pelvic and perineal pain: Secondary | ICD-10-CM

## 2018-04-12 DIAGNOSIS — O0992 Supervision of high risk pregnancy, unspecified, second trimester: Secondary | ICD-10-CM

## 2018-04-12 DIAGNOSIS — O0990 Supervision of high risk pregnancy, unspecified, unspecified trimester: Secondary | ICD-10-CM

## 2018-04-12 DIAGNOSIS — R12 Heartburn: Secondary | ICD-10-CM

## 2018-04-12 DIAGNOSIS — O2342 Unspecified infection of urinary tract in pregnancy, second trimester: Secondary | ICD-10-CM

## 2018-04-12 MED ORDER — FAMOTIDINE 20 MG PO TABS: 20.0000 mg | ORAL_TABLET | Freq: Two times a day (BID) | ORAL | 5 refills | Status: DC | PRN

## 2018-04-12 NOTE — Patient Instructions (Signed)

## 2018-04-12 NOTE — Progress Notes (Signed)
Patient states that she has not started feeling fetal movement yet, complains of pelvic pain that comes and goes, denies bleeding.

## 2018-04-12 NOTE — Progress Notes (Signed)
   PRENATAL VISIT NOTE  Subjective:  Holly Brown is a 32 y.o. [phone] at 7448w6d being seen today for ongoing prenatal care.  She is currently monitored for the following issues for this high-risk pregnancy and has Syncope; Heart palpitations; Family history of colon cancer; Gonorrhea affecting pregnancy in first trimester; Cervical high risk HPV (human papillomavirus) test positive; Supervision of high risk pregnancy, antepartum; History of preterm delivery, currently pregnant; and Urinary tract infection affecting care of mother in first trimester, antepartum on their problem list.  Patient reports pelvic pain, mostly right hip when rolling over in bed, getting out of chair, no cramping contractions no dysuria.  Contractions: Not present. Vag. Bleeding: None.   . Denies leaking of fluid.   The following portions of the patient's history were reviewed and updated as appropriate: allergies, current medications, past family history, past medical history, past social history, past surgical history and problem list. Problem list updated.  Objective:   Vitals:   04/12/18 1059  BP: 107/71  Pulse: 85  Weight: 112 kg    Fetal Status:           General:  Alert, oriented and cooperative. Patient is in no acute distress.  Skin: Skin is warm and dry. No rash noted.   Cardiovascular: Normal heart rate noted  Respiratory: Normal respiratory effort, no problems with respiration noted  Abdomen: Soft, gravid, appropriate for gestational age.  Pain/Pressure: Present     Pelvic: Cervical exam deferred        Extremities: Normal range of motion.  Edema: Trace  Mental Status: Normal mood and affect. Normal behavior. Normal judgment and thought content.   Assessment and Plan:  Pregnancy: [id number] at 4948w6d  1. Supervision of high risk pregnancy, antepartum --Anticipatory guidance about next visits/weeks of pregnancy given.   2. Urinary tract infection affecting care of mother in first trimester,  antepartum --TOC positive 12/9.  Retreated. TOC at next visit.  3. Pelvic pain affecting pregnancy in second trimester, antepartum --Pt using heat/ice/Tylenol. --Short course of ibuprofen is Ok in mid pregnancy. --Pt reports she had PT in previous pregnancy.   - Ambulatory referral to Physical Therapy  4. Heartburn during pregnancy in second trimester --Pt using Tums but not effective.  Consider PPI if H2B not effective.  - famotidine (PEPCID) 20 MG tablet; Take 1 tablet (20 mg total) by mouth 2 (two) times daily as needed for heartburn or indigestion.  Dispense: 60 tablet; Refill: 5  Preterm labor symptoms and general obstetric precautions including but not limited to vaginal bleeding, contractions, leaking of fluid and fetal movement were reviewed in detail with the patient. Please refer to After Visit Summary for other counseling recommendations.  Return in about 4 weeks (around 05/10/2018).  Future Appointments  Date Time Provider Department Center  04/15/2018  8:45 AM [facility] US 2 [facility]  04/26/2018  8:00 AM [facility] None    Sharen CounterLisa Leftwich-Kirby, CNM

## 2018-04-15 ENCOUNTER — Other Ambulatory Visit (HOSPITAL_COMMUNITY): Payer: Self-pay | Admitting: *Deleted

## 2018-04-15 ENCOUNTER — Other Ambulatory Visit: Payer: Self-pay | Admitting: Advanced Practice Midwife

## 2018-04-15 ENCOUNTER — Ambulatory Visit (HOSPITAL_COMMUNITY)
Admission: RE | Admit: 2018-04-15 | Discharge: 2018-04-15 | Disposition: A | Discharge status: Home / Self Care | Payer: 59 | Source: Ambulatory Visit | Attending: Advanced Practice Midwife | Admitting: Advanced Practice Midwife

## 2018-04-15 DIAGNOSIS — O09212 Supervision of pregnancy with history of pre-term labor, second trimester: Secondary | ICD-10-CM | POA: Insufficient documentation

## 2018-04-15 DIAGNOSIS — O09213 Supervision of pregnancy with history of pre-term labor, third trimester: Secondary | ICD-10-CM

## 2018-04-15 DIAGNOSIS — O0990 Supervision of high risk pregnancy, unspecified, unspecified trimester: Secondary | ICD-10-CM

## 2018-04-15 DIAGNOSIS — Z3A19 19 weeks gestation of pregnancy: Secondary | ICD-10-CM | POA: Diagnosis not present

## 2018-04-15 DIAGNOSIS — Z363 Encounter for antenatal screening for malformations: Secondary | ICD-10-CM | POA: Diagnosis not present

## 2018-04-15 DIAGNOSIS — Z362 Encounter for other antenatal screening follow-up: Secondary | ICD-10-CM

## 2018-04-15 DIAGNOSIS — O99212 Obesity complicating pregnancy, second trimester: Secondary | ICD-10-CM | POA: Diagnosis not present

## 2018-04-21 NOTE — L&D Delivery Note (Signed)
Delivery Note At 5:39 PM a viable female was delivered via Vaginal, Spontaneous (Presentation: ROA- delivered with RN only in room).  APGAR: 9, 9; weight 6 lb 15.6 oz (3164 g).   Placenta status:Spontaneous, Intant .  3VC Cord:  with the following complications: None  Anesthesia:  None Episiotomy: None Lacerations: 1st degree Suture Repair: na Est. Blood Loss (mL): 75  Mom to postpartum.  Baby to Couplet care / Skin to Skin.  Isa Rankin [facility] 08/31/2018, 8:15 PM

## 2018-04-26 ENCOUNTER — Other Ambulatory Visit: Payer: 59

## 2018-04-26 ENCOUNTER — Ambulatory Visit: Payer: 59 | Attending: Advanced Practice Midwife | Admitting: Physical Therapy

## 2018-04-27 DIAGNOSIS — O339 Maternal care for disproportion, unspecified: Secondary | ICD-10-CM | POA: Diagnosis not present

## 2018-05-10 ENCOUNTER — Ambulatory Visit (INDEPENDENT_AMBULATORY_CARE_PROVIDER_SITE_OTHER): Payer: 59 | Admitting: Advanced Practice Midwife

## 2018-05-10 VITALS — BP 137/82 | HR 89 | Wt 245.0 lb

## 2018-05-10 DIAGNOSIS — O26899 Other specified pregnancy related conditions, unspecified trimester: Secondary | ICD-10-CM

## 2018-05-10 DIAGNOSIS — Z3A22 22 weeks gestation of pregnancy: Secondary | ICD-10-CM

## 2018-05-10 DIAGNOSIS — O2341 Unspecified infection of urinary tract in pregnancy, first trimester: Secondary | ICD-10-CM

## 2018-05-10 DIAGNOSIS — R102 Pelvic and perineal pain unspecified side: Secondary | ICD-10-CM

## 2018-05-10 DIAGNOSIS — O0992 Supervision of high risk pregnancy, unspecified, second trimester: Secondary | ICD-10-CM

## 2018-05-10 DIAGNOSIS — O0990 Supervision of high risk pregnancy, unspecified, unspecified trimester: Secondary | ICD-10-CM

## 2018-05-10 DIAGNOSIS — O26892 Other specified pregnancy related conditions, second trimester: Secondary | ICD-10-CM

## 2018-05-10 DIAGNOSIS — O2342 Unspecified infection of urinary tract in pregnancy, second trimester: Secondary | ICD-10-CM

## 2018-05-10 NOTE — Patient Instructions (Signed)
Segundo trimestre de embarazo Second Trimester of Pregnancy  El segundo trimestre va desde la semana14 hasta la 27 (desde el mes 4 hasta el 6). Este suele ser el momento en el que mejor se siente. En general, las nuseas matutinas han disminuido o han desaparecido completamente. Tendr ms energa y podr aumentarle el apetito. El beb en gestacin se desarrolla rpidamente. Hacia el final del sexto mes, el beb mide aproximadamente 9 pulgadas (23 cm) y pesa alrededor de 1 libras (700 g). Es probable que sienta al beb moverse entre las 18 y 20 semanas del embarazo. Siga estas indicaciones en su casa: Medicamentos  Tome los medicamentos de venta libre y los recetados solamente como se lo haya indicado el mdico. Algunos medicamentos son seguros para tomar durante el embarazo y otros no lo son.  Tome vitaminas prenatales que contengan por lo menos 600microgramos (?g) de cido flico.  Si tiene dificultad para mover el intestino (estreimiento), tome un medicamento para ablandar las heces (laxante) si su mdico se lo autoriza. Comida y bebida   Ingiera alimentos saludables de manera regular.  No coma carne cruda ni quesos sin cocinar.  Si obtiene poca cantidad de calcio de los alimentos que ingiere, consulte a su mdico sobre la posibilidad de tomar un suplemento diario de calcio.  Evite el consumo de alimentos ricos en grasas y azcares, como los alimentos fritos y los dulces.  Si tiene malestar estomacal (nuseas) o devuelve (vomita): ? Ingiera 4 o 5comidas pequeas por da en lugar de 3abundantes. ? Intente comer algunas galletitas saladas. ? Beba lquidos entre las comidas, en lugar de hacerlo durante estas.  Para evitar el estreimiento: ? Consuma alimentos ricos en fibra, como frutas y verduras frescas, cereales integrales y frijoles. ? Beba suficiente lquido para mantener el pis (orina) claro o de color amarillo plido. Actividad  Haga ejercicios solamente como se lo haya  indicado el mdico. Interrumpa la actividad fsica si comienza a tener calambres.  No haga ejercicio si hace demasiado calor, hay demasiada humedad o se encuentra en un lugar de mucha altura (altitud alta).  Evite levantar pesos excesivos.  Use zapatos con tacones bajos. Mantenga una buena postura al sentarse y pararse.  Puede continuar teniendo relaciones sexuales, a menos que el mdico le indique lo contrario. Alivio del dolor y del malestar  Use un sostn que le brinde buen soporte si sus mamas estn sensibles.  Dese baos de asiento con agua tibia para aliviar el dolor o las molestias causadas por las hemorroides. Use una crema para las hemorroides si el mdico la autoriza.  Descanse con las piernas elevadas si tiene calambres o dolor de cintura.  Si desarrolla venas hinchadas y abultadas (vrices) en las piernas: ? Use medias de compresin o medias de descanso como se lo haya indicado el mdico. ? Levante (eleve) los pies durante 15minutos, 3 o 4veces por da. ? Limite el consumo de sal en sus alimentos. Cuidado prenatal  Escriba sus preguntas. Llvelas cuando concurra a las visitas prenatales.  Concurra a todas las visitas prenatales como se lo haya indicado el mdico. Esto es importante. Seguridad  Colquese el cinturn de seguridad cuando conduzca.  Haga una lista de los nmeros de telfono de emergencia, que incluya los nmeros de telfono de familiares, amigos, el hospital, as como los departamentos de polica y bomberos. Instrucciones generales  Consulte a su mdico sobre los alimentos que debe comer o pdale que la ayude a encontrar a quien pueda aconsejarla si necesita ese servicio.    Consulte a su mdico acerca de dnde se dictan clases prenatales cerca de donde vive. Comience las clases antes del mes 6 de embarazo.  No se d baos de inmersin en agua caliente, baos turcos ni saunas.  No se haga duchas vaginales ni use tampones o toallas higinicas perfumadas.   No mantenga las piernas cruzadas durante mucho tiempo.  Vaya al dentista si an no lo hizo. Use un cepillo de cerdas suaves para cepillarse los dientes. Psese el hilo dental suavemente.  No fume, no consuma hierbas ni beba alcohol. No tome frmacos que el mdico no haya autorizado.  No consuma ningn producto que contenga nicotina o tabaco, como cigarrillos y cigarrillos electrnicos. Si necesita ayuda para dejar de fumar, consulte al mdico.  Evite el contacto con las bandejas sanitarias de los gatos y la tierra que estos animales usan. Estos elementos contienen bacterias que pueden causar defectos congnitos al beb y la posible prdida del beb (aborto espontneo) o la muerte fetal. Comunquese con un mdico si:  Tiene clicos leves o siente presin en la parte baja del vientre.  Tiene dolor al hacer pis (orinar).  Advierte un lquido con olor ftido que proviene de la vagina.  Tiene malestar estomacal (nuseas), devuelve (vomita) o tiene deposiciones acuosas (diarrea).  Sufre un dolor persistente en el abdomen.  Siente mareos. Solicite ayuda de inmediato si:  Tiene fiebre.  Tiene una prdida de lquido por la vagina.  Tiene sangrado o pequeas prdidas vaginales.  Siente dolor intenso o clicos en el abdomen.  Sube o baja de peso rpidamente.  Tiene dificultades para recuperar el aliento y siente dolor en el pecho.  Sbitamente se le hinchan mucho el rostro, las manos, los tobillos, los pies o las piernas.  No ha sentido los movimientos del beb durante una hora.  Siente un dolor de cabeza intenso que no se alivia al tomar medicamentos.  Tiene dificultad para ver. Resumen  El segundo trimestre va desde la semana14 hasta la 27, desde el mes 4 hasta el 6. Este suele ser el momento en el que mejor se siente.  Para cuidarse y cuidar a su beb en gestacin, debe comer alimentos saludables, tomar medicamentos solamente si su mdico le indica que lo haga y hacer  actividades que sean seguras para usted y su beb.  Llame al mdico si se enferma o si nota algo inusual acerca de su embarazo. Tambin llame al mdico si necesita ayuda para saber qu alimentos debe comer o si quiere saber qu actividades puede realizar de forma segura. Esta informacin no tiene como fin reemplazar el consejo del mdico. Asegrese de hacerle al mdico cualquier pregunta que tenga. Document Released: 12/08/2012 Document Revised: 12/31/2016 Document Reviewed: 12/31/2016 Elsevier Interactive Patient Education  2019 Elsevier Inc.  

## 2018-05-10 NOTE — Progress Notes (Signed)
   PRENATAL VISIT NOTE  Subjective:  Holly Brown is a 33 y.o. [phone] at [redacted]w[redacted]d being seen today for ongoing prenatal care.  She is currently monitored for the following issues for this high-risk pregnancy and has Syncope; Heart palpitations; Family history of colon cancer; Gonorrhea affecting pregnancy in first trimester; Cervical high risk HPV (human papillomavirus) test positive; Supervision of high risk pregnancy, antepartum; History of preterm delivery, currently pregnant; and Urinary tract infection affecting care of mother in first trimester, antepartum on their problem list.  Patient reports ongoing pelvic pain, unchanged.  Contractions: Not present. Vag. Bleeding: None.  Movement: Present. Denies leaking of fluid.   The following portions of the patient's history were reviewed and updated as appropriate: allergies, current medications, past family history, past medical history, past social history, past surgical history and problem list. Problem list updated.  Objective:   Vitals:   05/10/18 0935  BP: 137/82  Pulse: 89  Weight: 111.1 kg    Fetal Status: Fetal Heart Rate (bpm): 145   Movement: Present     General:  Alert, oriented and cooperative. Patient is in no acute distress.  Skin: Skin is warm and dry. No rash noted.   Cardiovascular: Normal heart rate noted  Respiratory: Normal respiratory effort, no problems with respiration noted  Abdomen: Soft, gravid, appropriate for gestational age.  Pain/Pressure: Absent     Pelvic: Cervical exam deferred        Extremities: Normal range of motion.     Mental Status: Normal mood and affect. Normal behavior. Normal judgment and thought content.   Assessment and Plan:  Pregnancy: [id number] at [redacted]w[redacted]d  1. Supervision of high risk pregnancy, antepartum --Anticipatory guidance about next visits/weeks of pregnancy given.  2. Pain of round ligament affecting pregnancy, antepartum --Pt pain is worse when up and walking at work.  Improved with maternity support but hard to wear at work when sitting. --Discussed options for pregnancy support belts to purchase that are smaller, may be more comfortable for sitting.   3. UTI (urinary tract infection) during pregnancy, first trimester --UTI x 2 in early pregnancy, pt reports she did not keep down all abx during tx.  TOC positive 12/9 so retreated. --TOC today.  If positive again, consider prophylaxis daily.   Preterm labor symptoms and general obstetric precautions including but not limited to vaginal bleeding, contractions, leaking of fluid and fetal movement were reviewed in detail with the patient. Please refer to After Visit Summary for other counseling recommendations.  No follow-ups on file.  Future Appointments  Date Time Provider Department Center  05/20/2018  8:45 AM [facility] [place] 2 [facility]  06/14/2018  8:15 AM Leftwich-Kirby, Wilmer Floor, CNM [facility] None    Sharen Counter, CNM

## 2018-05-13 LAB — URINE CULTURE, OB REFLEX

## 2018-05-13 LAB — CULTURE, OB URINE

## 2018-05-16 MED ORDER — NITROFURANTOIN MONOHYD MACRO 100 MG PO CAPS: 100.0000 mg | ORAL_CAPSULE | Freq: Every day | ORAL | 5 refills | Status: DC

## 2018-05-16 MED ORDER — NITROFURANTOIN MONOHYD MACRO 100 MG PO CAPS: 100.0000 mg | ORAL_CAPSULE | Freq: Two times a day (BID) | ORAL | 0 refills | Status: DC

## 2018-05-16 NOTE — Addendum Note (Signed)
Addended by: Sharen Counter A on: 05/16/2018 01:29 AM   Modules accepted: Orders

## 2018-05-20 ENCOUNTER — Ambulatory Visit (HOSPITAL_COMMUNITY)
Admission: RE | Admit: 2018-05-20 | Discharge: 2018-05-20 | Disposition: A | Discharge status: Home / Self Care | Payer: 59 | Source: Ambulatory Visit | Attending: Advanced Practice Midwife | Admitting: Advanced Practice Midwife

## 2018-05-20 DIAGNOSIS — Z362 Encounter for other antenatal screening follow-up: Secondary | ICD-10-CM | POA: Diagnosis not present

## 2018-05-20 DIAGNOSIS — O09212 Supervision of pregnancy with history of pre-term labor, second trimester: Secondary | ICD-10-CM

## 2018-05-20 DIAGNOSIS — Z3A24 24 weeks gestation of pregnancy: Secondary | ICD-10-CM

## 2018-05-20 DIAGNOSIS — O99212 Obesity complicating pregnancy, second trimester: Secondary | ICD-10-CM | POA: Diagnosis not present

## 2018-05-31 ENCOUNTER — Telehealth: Payer: Self-pay

## 2018-05-31 NOTE — Telephone Encounter (Signed)
Patient called in and stated that she is taking macrobid for the remainder of pregnancy due to recurrent UTI. Despite this, Pt states that all weekend she has been feeling increased pressure. Denies leaking of fluid, bleeding. Reports positive fetal movement and denies contractions. Provider advised pt to be added to schedule for tomorrow. Advised pt to go to [facility] immediately is symptoms get worse before appt. Pt agreed.

## 2018-06-01 ENCOUNTER — Ambulatory Visit (INDEPENDENT_AMBULATORY_CARE_PROVIDER_SITE_OTHER): Payer: 59 | Admitting: Advanced Practice Midwife

## 2018-06-01 ENCOUNTER — Encounter: Payer: Self-pay | Admitting: Advanced Practice Midwife

## 2018-06-01 VITALS — BP 127/81 | HR 94 | Wt 251.6 lb

## 2018-06-01 DIAGNOSIS — O0990 Supervision of high risk pregnancy, unspecified, unspecified trimester: Secondary | ICD-10-CM

## 2018-06-01 DIAGNOSIS — O09212 Supervision of pregnancy with history of pre-term labor, second trimester: Secondary | ICD-10-CM

## 2018-06-01 DIAGNOSIS — O09899 Supervision of other high risk pregnancies, unspecified trimester: Secondary | ICD-10-CM

## 2018-06-01 DIAGNOSIS — O2340 Unspecified infection of urinary tract in pregnancy, unspecified trimester: Secondary | ICD-10-CM | POA: Diagnosis not present

## 2018-06-01 DIAGNOSIS — O2342 Unspecified infection of urinary tract in pregnancy, second trimester: Secondary | ICD-10-CM

## 2018-06-01 DIAGNOSIS — O09219 Supervision of pregnancy with history of pre-term labor, unspecified trimester: Secondary | ICD-10-CM

## 2018-06-01 DIAGNOSIS — O0992 Supervision of high risk pregnancy, unspecified, second trimester: Secondary | ICD-10-CM

## 2018-06-01 DIAGNOSIS — Z3A26 26 weeks gestation of pregnancy: Secondary | ICD-10-CM

## 2018-06-01 LAB — POCT URINALYSIS DIPSTICK OB
Bilirubin, UA: NEGATIVE
Ketones, UA: NEGATIVE
Nitrite, UA: POSITIVE
ODOR: POSITIVE
PH UA: 6.5 (ref 5.0–8.0)
Spec Grav, UA: 1.01 (ref 1.010–1.025)
Urobilinogen, UA: 0.2 E.U./dL

## 2018-06-01 NOTE — Patient Instructions (Addendum)
TDaP Vaccine Pregnancy Get the Whooping Cough Vaccine While You Are Pregnant (CDC)  It is important for women to get the whooping cough vaccine in the third trimester of each pregnancy. Vaccines are the best way to prevent this disease. There are 2 different whooping cough vaccines. Both vaccines combine protection against whooping cough, tetanus and diphtheria, but they are for different age groups: Tdap: for everyone 11 years or older, including pregnant women  DTaP: for children 2 months through 6 years of age  You need the whooping cough vaccine during each of your pregnancies The recommended time to get the shot is during your 27th through 36th week of pregnancy, preferably during the earlier part of this time period. The Centers for Disease Control and Prevention (CDC) recommends that pregnant women receive the whooping cough vaccine for adolescents and adults (called Tdap vaccine) during the third trimester of each pregnancy. The recommended time to get the shot is during your 27th through 36th week of pregnancy, preferably during the earlier part of this time period. This replaces the original recommendation that pregnant women get the vaccine only if they had not previously received it. The American College of Obstetricians and Gynecologists and the American College of Nurse-Midwives support this recommendation.  You should get the whooping cough vaccine while pregnant to pass protection to your baby frame support disabled and/or not supported in this browser  Learn why Laura decided to get the whooping cough vaccine in her 3rd trimester of pregnancy and how her baby girl was born with some protection against the disease. Also available on YouTube. After receiving the whooping cough vaccine, your body will create protective antibodies (proteins produced by the body to fight off diseases) and pass some of them to your baby before birth. These antibodies provide your baby some short-term  protection against whooping cough in early life. These antibodies can also protect your baby from some of the more serious complications that come along with whooping cough. Your protective antibodies are at their highest about 2 weeks after getting the vaccine, but it takes time to pass them to your baby. So the preferred time to get the whooping cough vaccine is early in your third trimester. The amount of whooping cough antibodies in your body decreases over time. That is why CDC recommends you get a whooping cough vaccine during each pregnancy. Doing so allows each of your babies to get the greatest number of protective antibodies from you. This means each of your babies will get the best protection possible against this disease.  Getting the whooping cough vaccine while pregnant is better than getting the vaccine after you give birth Whooping cough vaccination during pregnancy is ideal so your baby will have short-term protection as soon as he is born. This early protection is important because your baby will not start getting his whooping cough vaccines until he is 2 months old. These first few months of life are when your baby is at greatest risk for catching whooping cough. This is also when he's at greatest risk for having severe, potentially life-threating complications from the infection. To avoid that gap in protection, it is best to get a whooping cough vaccine during pregnancy. You will then pass protection to your baby before he is born. To continue protecting your baby, he should get whooping cough vaccines starting at 2 months old. You may never have gotten the Tdap vaccine before and did not get it during this pregnancy. If so, you should make sure   to get the vaccine immediately after you give birth, before leaving the hospital or birthing center. It will take about 2 weeks before your body develops protection (antibodies) in response to the vaccine. Once you have protection from the vaccine,  you are less likely to give whooping cough to your newborn while caring for him. But remember, your baby will still be at risk for catching whooping cough from others. A recent study looked to see how effective Tdap was at preventing whooping cough in babies whose mothers got the vaccine while pregnant or in the hospital after giving birth. The study found that getting Tdap between 27 through 36 weeks of pregnancy is 85% more effective at preventing whooping cough in babies younger than 2 months old. Blood tests cannot tell if you need a whooping cough vaccine There are no blood tests that can tell you if you have enough antibodies in your body to protect yourself or your baby against whooping cough. Even if you have been sick with whooping cough in the past or previously received the vaccine, you still should get the vaccine during each pregnancy. Breastfeeding may pass some protective antibodies onto your baby By breastfeeding, you may pass some antibodies you have made in response to the vaccine to your baby. When you get a whooping cough vaccine during your pregnancy, you will have antibodies in your breast milk that you can share with your baby as soon as your milk comes in. However, your baby will not get protective antibodies immediately if you wait to get the whooping cough vaccine until after delivering your baby. This is because it takes about 2 weeks for your body to create antibodies. Learn more about the health benefits of breastfeeding.   Preterm Labor and Birth Information  The normal length of a pregnancy is 39-41 weeks. Preterm labor is when labor starts before 37 completed weeks of pregnancy. What are the risk factors for preterm labor? Preterm labor is more likely to occur in women who:  Have certain infections during pregnancy such as a bladder infection, sexually transmitted infection, or infection inside the uterus (chorioamnionitis).  Have a shorter-than-normal cervix.  Have  gone into preterm labor before.  Have had surgery on their cervix.  Are younger than age 55 or older than age 54.  Are African American.  Are pregnant with twins or multiple babies (multiple gestation).  Take street drugs or smoke while pregnant.  Do not gain enough weight while pregnant.  Became pregnant shortly after having been pregnant. What are the symptoms of preterm labor? Symptoms of preterm labor include:  Cramps similar to those that can happen during a menstrual period. The cramps may happen with diarrhea.  Pain in the abdomen or lower back.  Regular uterine contractions that may feel like tightening of the abdomen.  A feeling of increased pressure in the pelvis.  Increased watery or bloody mucus discharge from the vagina.  Water breaking (ruptured amniotic sac). Why is it important to recognize signs of preterm labor? It is important to recognize signs of preterm labor because babies who are born prematurely may not be fully developed. This can put them at an increased risk for:  Long-term (chronic) heart and lung problems.  Difficulty immediately after birth with regulating body systems, including blood sugar, body temperature, heart rate, and breathing rate.  Bleeding in the brain.  Cerebral palsy.  Learning difficulties.  Death. These risks are highest for babies who are born before 6 weeks of pregnancy. How  is preterm labor treated? Treatment depends on the length of your pregnancy, your condition, and the health of your baby. It may involve:  Having a stitch (suture) placed in your cervix to prevent your cervix from opening too early (cerclage).  Taking or being given medicines, such as: ? Hormone medicines. These may be given early in pregnancy to help support the pregnancy. ? Medicine to stop contractions. ? Medicines to help mature the baby's lungs. These may be prescribed if the risk of delivery is high. ? Medicines to prevent your baby from  developing cerebral palsy. If the labor happens before 34 weeks of pregnancy, you may need to stay in the hospital. What should I do if I think I am in preterm labor? If you think that you are going into preterm labor, call your health care provider right away. How can I prevent preterm labor in future pregnancies? To increase your chance of having a full-term pregnancy:  Do not use any tobacco products, such as cigarettes, chewing tobacco, and e-cigarettes. If you need help quitting, ask your health care provider.  Do not use street drugs or medicines that have not been prescribed to you during your pregnancy.  Talk with your health care provider before taking any herbal supplements, even if you have been taking them regularly.  Make sure you gain a healthy amount of weight during your pregnancy.  Watch for infection. If you think that you might have an infection, get it checked right away.  Make sure to tell your health care provider if you have gone into preterm labor before. This information is not intended to replace advice given to you by your health care provider. Make sure you discuss any questions you have with your health care provider. Document Released: 06/28/2003 Document Revised: 09/18/2015 Document Reviewed: 08/29/2015 Elsevier Interactive Patient Education  2019 Elsevier Inc.  

## 2018-06-01 NOTE — Progress Notes (Signed)
   PRENATAL VISIT NOTE  Subjective:  Holly Brown is a 33 y.o. [phone] at [redacted]w[redacted]d being seen today for ongoing prenatal care.  She is currently monitored for the following issues for this high-risk pregnancy and has Syncope; Heart palpitations; Family history of colon cancer; Gonorrhea affecting pregnancy in first trimester; Cervical high risk HPV (human papillomavirus) test positive; Supervision of high risk pregnancy, antepartum; History of preterm delivery, currently pregnant; and Urinary tract infection affecting care of mother in first trimester, antepartum on their problem list.  Patient reports pelvic pressure and pain worse than with other pregnancies.  Denies hematuria, urgency, frequency.   Contractions: Not present. Vag. Bleeding: None.  Movement: Present. Denies leaking of fluid.   Taking Macrobid as directed for UTI.   The following portions of the patient's history were reviewed and updated as appropriate: allergies, current medications, past family history, past medical history, past social history, past surgical history and problem list. Problem list updated.  Objective:   Vitals:   06/01/18 1326  BP: 127/81  Pulse: 94  Weight: 251 lb 9.6 oz (114.1 kg)    Fetal Status: Fetal Heart Rate (bpm): 149 Fundal Height: 28 cm Movement: Present  Presentation: Vertex  General:  Alert, oriented and cooperative. Patient is in no acute distress.  Skin: Skin is warm and dry. No rash noted.   Cardiovascular: Normal heart rate noted  Respiratory: Normal respiratory effort, no problems with respiration noted  Abdomen: Soft, gravid, appropriate for gestational age.  Pain/Pressure: Present     Pelvic: Cervical exam performed Dilation: Closed Effacement (%): 0 Station: Ballotable soft, moderate amount of thin, white, slightly malodorous discharge. No bleeding.   Extremities: Normal range of motion.  Edema: None  Mental Status: Normal mood and affect. Normal behavior. Normal judgment and  thought content.   Assessment and Plan:  Pregnancy: [id number] at [redacted]w[redacted]d  1. Hx UTI (urinary tract infection) in pregnancy, antepartum  - Culture, OB Urine - POC Urinalysis Dipstick OB  2. History of preterm delivery, currently pregnant  - POC Urinalysis Dipstick OB - fFN  3. Supervision of high risk pregnancy, antepartum  4. Pelvic pressure - fFN - Wet prep  Preterm labor symptoms and general obstetric precautions including but not limited to vaginal bleeding, contractions, leaking of fluid and fetal movement were reviewed in detail with the patient. Please refer to After Visit Summary for other counseling recommendations.  Return in about 2 weeks (around 06/15/2018) for ROB/GTT.  Future Appointments  Date Time Provider Department Center  06/14/2018  8:15 AM Leftwich-Kirby, Wilmer Floor, CNM [facility] None    Dorathy Kinsman, [place]

## 2018-06-02 ENCOUNTER — Other Ambulatory Visit (HOSPITAL_COMMUNITY)
Admission: RE | Admit: 2018-06-02 | Discharge: 2018-06-02 | Disposition: A | Discharge status: Home / Self Care | Payer: 59 | Source: Ambulatory Visit | Attending: Advanced Practice Midwife | Admitting: Advanced Practice Midwife

## 2018-06-02 DIAGNOSIS — O0990 Supervision of high risk pregnancy, unspecified, unspecified trimester: Secondary | ICD-10-CM | POA: Diagnosis not present

## 2018-06-02 NOTE — Addendum Note (Signed)
Addended by: Kennon Portela on: 06/02/2018 08:53 AM   Modules accepted: Orders

## 2018-06-02 NOTE — Addendum Note (Signed)
Addended by: Kennon Portela on: 06/02/2018 08:47 AM   Modules accepted: Orders

## 2018-06-03 LAB — CERVICOVAGINAL ANCILLARY ONLY
Bacterial vaginitis: POSITIVE — AB
Candida vaginitis: NEGATIVE
Chlamydia: NEGATIVE
Neisseria Gonorrhea: NEGATIVE
Trichomonas: NEGATIVE

## 2018-06-04 ENCOUNTER — Other Ambulatory Visit: Payer: Self-pay

## 2018-06-04 ENCOUNTER — Encounter (HOSPITAL_COMMUNITY): Payer: Self-pay | Admitting: *Deleted

## 2018-06-04 ENCOUNTER — Inpatient Hospital Stay (HOSPITAL_COMMUNITY)
Admission: AD | Admit: 2018-06-04 | Discharge: 2018-06-04 | Disposition: A | Discharge status: Home / Self Care | Payer: 59 | Attending: Obstetrics & Gynecology | Admitting: Obstetrics & Gynecology

## 2018-06-04 DIAGNOSIS — O26892 Other specified pregnancy related conditions, second trimester: Secondary | ICD-10-CM | POA: Diagnosis not present

## 2018-06-04 DIAGNOSIS — O09899 Supervision of other high risk pregnancies, unspecified trimester: Secondary | ICD-10-CM

## 2018-06-04 DIAGNOSIS — R109 Unspecified abdominal pain: Secondary | ICD-10-CM

## 2018-06-04 DIAGNOSIS — O2342 Unspecified infection of urinary tract in pregnancy, second trimester: Secondary | ICD-10-CM | POA: Insufficient documentation

## 2018-06-04 DIAGNOSIS — B9689 Other specified bacterial agents as the cause of diseases classified elsewhere: Secondary | ICD-10-CM | POA: Insufficient documentation

## 2018-06-04 DIAGNOSIS — O23592 Infection of other part of genital tract in pregnancy, second trimester: Secondary | ICD-10-CM | POA: Diagnosis not present

## 2018-06-04 DIAGNOSIS — O0990 Supervision of high risk pregnancy, unspecified, unspecified trimester: Secondary | ICD-10-CM

## 2018-06-04 DIAGNOSIS — Z87891 Personal history of nicotine dependence: Secondary | ICD-10-CM | POA: Insufficient documentation

## 2018-06-04 DIAGNOSIS — N760 Acute vaginitis: Secondary | ICD-10-CM | POA: Diagnosis not present

## 2018-06-04 DIAGNOSIS — Z3A26 26 weeks gestation of pregnancy: Secondary | ICD-10-CM | POA: Insufficient documentation

## 2018-06-04 DIAGNOSIS — O2340 Unspecified infection of urinary tract in pregnancy, unspecified trimester: Secondary | ICD-10-CM

## 2018-06-04 DIAGNOSIS — O26899 Other specified pregnancy related conditions, unspecified trimester: Secondary | ICD-10-CM

## 2018-06-04 DIAGNOSIS — O98211 Gonorrhea complicating pregnancy, first trimester: Secondary | ICD-10-CM

## 2018-06-04 DIAGNOSIS — O09219 Supervision of pregnancy with history of pre-term labor, unspecified trimester: Secondary | ICD-10-CM

## 2018-06-04 DIAGNOSIS — Z3A23 23 weeks gestation of pregnancy: Secondary | ICD-10-CM

## 2018-06-04 LAB — CULTURE, OB URINE

## 2018-06-04 LAB — URINE CULTURE, OB REFLEX

## 2018-06-04 LAB — URINALYSIS, MICROSCOPIC (REFLEX): WBC, UA: >50 WBC/hpf (ref 0–5)

## 2018-06-04 LAB — URINALYSIS, ROUTINE W REFLEX MICROSCOPIC
Bilirubin Urine: NEGATIVE
Glucose, UA: NEGATIVE mg/dL
KETONES UR: 15 mg/dL — AB
NITRITE: NEGATIVE
PH: 6 (ref 5.0–8.0)
PROTEIN: 30 mg/dL — AB
Specific Gravity, Urine: 1.015 (ref 1.005–1.030)

## 2018-06-04 MED ORDER — METRONIDAZOLE 0.75 % VA GEL: 1.0000 | Freq: Every day | VAGINAL | 0 refills | Status: DC

## 2018-06-04 MED ORDER — NITROFURANTOIN MONOHYD MACRO 100 MG PO CAPS: 100.0000 mg | ORAL_CAPSULE | Freq: Two times a day (BID) | ORAL | 5 refills | Status: DC

## 2018-06-04 NOTE — MAU Provider Note (Signed)
History     CSN: [id number]  Arrival date and time: 06/04/18 4128   First Provider Initiated Contact with Patient 06/04/18 1719      Chief Complaint  Patient presents with  . Cramping   Holly Brown is a 33 y.o. [phone] at [redacted]w[redacted]d who presents for Cramping. Patient states the cramping started today and has been intermittent in nature.  Patient reports that she was seen in the office on Tuesday and had a pelvic exam due to complaint of pelvic pressure.  Patient denies issues with urination, constipation, and diarrhea.  She endorses fetal movement and denies vaginal concerns including leaking, bleeding, discharge, odor, irritation, and itching.      OB History    Gravida  5   Para  3   Term  2   Preterm  1   AB  1   Living  3     SAB  1   TAB      Ectopic      Multiple      Live Births  3           Past Medical History:  Diagnosis Date  . Migraines   . Seizures (HCC)    Jan.2017    Past Surgical History:  Procedure Laterality Date  . DILATION AND CURETTAGE OF UTERUS    . WISDOM TOOTH EXTRACTION  2011    Family History  Problem Relation Age of Onset  . Colon cancer Father   . Hypertension Maternal Grandmother   . Diabetes Maternal Grandmother   . Hypertension Paternal Grandmother   . Diabetes Mother     Social History   Tobacco Use  . Smoking status: Former Smoker    Packs/day: 0.25    Types: Cigarettes    Start date: 12/21/2011  . Smokeless tobacco: Never Used  Substance Use Topics  . Alcohol use: Not Currently    Alcohol/week: 0.0 standard drinks    Comment: ocassionally   . Drug use: No    Allergies: No Known Allergies  Medications Prior to Admission  Medication Sig Dispense Refill Last Dose  . cyclobenzaprine (FLEXERIL) 10 MG tablet Take 0.5-1 tablets (5-10 mg total) by mouth 3 (three) times daily as needed for muscle spasms. (Patient taking differently: Take 5-10 mg by mouth 3 (three) times daily as needed for muscle spasms.  ) 20 tablet 0 Taking  . nitrofurantoin, macrocrystal-monohydrate, (MACROBID) 100 MG capsule Take 1 capsule (100 mg total) by mouth at bedtime. 30 capsule 5 Taking  . ondansetron (ZOFRAN) 4 MG tablet Take 1 tablet (4 mg total) by mouth every 8 (eight) hours as needed for nausea or vomiting. (Patient taking differently: Take 4 mg by mouth every 8 (eight) hours as needed for nausea or vomiting. ) 20 tablet 0 Taking    Review of Systems  Constitutional: Negative for chills and fever.  Gastrointestinal: Positive for abdominal pain. Negative for diarrhea, nausea and vomiting.  Genitourinary: Negative for vaginal bleeding and vaginal discharge.   Physical Exam   Blood pressure 127/71, pulse (!) 106, temperature 98.7 F (37.1 C), temperature source Oral, resp. rate 20, height 5\' 11"  (1.803 m), weight 113.7 kg, last menstrual period 11/20/2017, SpO2 100 %.  Physical Exam  Constitutional: She is oriented to person, place, and time. She appears well-developed and well-nourished.  HENT:  Head: Normocephalic and atraumatic.  Eyes: Conjunctivae are normal.  Neck: Normal range of motion.  Cardiovascular: Normal rate, regular rhythm and normal heart sounds.  Respiratory: Effort  normal and breath sounds normal.  GI: Soft. Bowel sounds are normal. There is no abdominal tenderness. There is no CVA tenderness.  Musculoskeletal: Normal range of motion.  Neurological: She is alert and oriented to person, place, and time.  Skin: Skin is warm and dry.  Psychiatric: She has a normal mood and affect. Her behavior is normal.    Fetal Assessment 145 bpm, Mod Var, -Decels, +15x15 Accels Toco: None graphed  MAU Course   Results for orders placed or performed during the hospital encounter of 06/04/18 (from the past 24 hour(s))  Urinalysis, Routine w reflex microscopic     Status: Abnormal   Collection Time: 06/04/18  5:38 PM  Result Value Ref Range   Color, Urine YELLOW YELLOW   APPearance CLEAR CLEAR    Specific Gravity, Urine 1.015 1.005 - 1.030   pH 6.0 5.0 - 8.0   Glucose, UA NEGATIVE NEGATIVE mg/dL   Hgb urine dipstick TRACE (A) NEGATIVE   Bilirubin Urine NEGATIVE NEGATIVE   Ketones, ur 15 (A) NEGATIVE mg/dL   Protein, ur 30 (A) NEGATIVE mg/dL   Nitrite NEGATIVE NEGATIVE   Leukocytes,Ua LARGE (A) NEGATIVE  Urinalysis, Microscopic (reflex)     Status: Abnormal   Collection Time: 06/04/18  5:38 PM  Result Value Ref Range   RBC / HPF 0-5 0 - 5 RBC/hpf   WBC, UA >50 0 - 5 WBC/hpf   Bacteria, UA MANY (A) NONE SEEN   Squamous Epithelial / LPF 0-5 0 - 5    MDM PE Labs: UA Review of office labs  Assessment and Plan  33 year old  [id number] at 26.3wks Cat I FT  Abdominal Cramping Bacterial Vaginosis UTI-Recurrent  -Discussed lab results from appt on Wednesday -Informed of need to increase Macrobid to BID x 7 days for E.coli in urine -Discussed script for Metrogel to be sent to pharmacy -Deferred Pelvic Exam and limited exam given; Exam findings discussed -Patient without further questions or concerns. -NST reactive and reassuring for fetal age -Encouraged to call or return to MAU if symptoms worsen or with the onset of new symptoms. -Discharged to home in good condition   Cherre Robins MSN, CNM 06/04/2018, 5:20 PM

## 2018-06-04 NOTE — Discharge Instructions (Signed)
Pregnancy and Urinary Tract Infection What is a urinary tract infection?  A urinary tract infection (UTI) is an infection of any part of the urinary tract. This includes the kidneys, the tubes that connect your kidneys to your bladder (ureters), the bladder, and the tube that carries urine out of your body (urethra). These organs make, store, and get rid of urine in the body.  An upper UTI affects the ureters and kidneys (pyelonephritis), and a lower UTI affects the bladder (cystitis) and urethra (urethritis). Most urinary tract infections are caused by bacteria in your genital area, around the entrance to your urinary tract (urethra). These bacteria grow and cause irritation and inflammation of your urinary tract. Why am I more likely to get a UTI during pregnancy? You are more likely to develop a UTI during pregnancy because:  The physical and hormonal changes your body goes through can make it easier for bacteria to get into your urinary tract.  Your growing baby puts pressure on your uterus and can affect urine flow. Does a UTI place my baby at risk? An untreated UTI during pregnancy could lead to a kidney infection, which can cause health problems that could affect your baby. Possible complications of an untreated UTI include:  Having your baby before 37 weeks of pregnancy (premature).  Having a baby with a low birth weight.  Developing high blood pressure during pregnancy (preeclampsia).  Having a low hemoglobin level (anemia). What are the symptoms of a UTI? Symptoms of a UTI include:  Needing to urinate right away (urgently).  Frequent urination or passing small amounts of urine frequently.  Pain or burning with urination.  Blood in the urine.  Urine that smells bad or unusual.  Trouble urinating.  Cloudy urine.  Pain in the abdomen or lower back.  Vaginal discharge. You may also have:  Vomiting or a decreased appetite.  Confusion.  Irritability or  tiredness.  A fever.  Diarrhea. What are the treatment options for a UTI during pregnancy? Treatment for this condition may include:  Antibiotic medicines that are safe to take during pregnancy.  Other medicines to treat less common causes of UTI. How can I prevent a UTI? To prevent a UTI:  Go to the bathroom as soon as you feel the need. Do not hold urine for long periods of time.  Always wipe from front to back after a bowel movement. Use each tissue one time when you wipe.  Empty your bladder after sex.  Keep your genital area dry.  Drink 6-10 glasses of water each day.  Do not douche or use deodorant sprays. Contact a health care provider if:  Your symptoms do not improve or they get worse.  You have abnormal vaginal discharge. Get help right away if:  You have a fever.  You have nausea and vomiting.  You have back or side pain.  You feel contractions in your uterus.  You have lower belly pain.  You have a gush of fluid from your vagina.  You have blood in your urine. Summary  A urinary tract infection (UTI) is an infection of any part of the urinary tract, which includes the kidneys, ureters, bladder, and urethra.  Most urinary tract infections are caused by bacteria in your genital area, around the entrance to your urinary tract (urethra).  You are more likely to develop a UTI during pregnancy.  If you were prescribed an antibiotic, take it as told by your health care provider. Do not stop taking the  antibiotic even if you start to feel better. This information is not intended to replace advice given to you by your health care provider. Make sure you discuss any questions you have with your health care provider. Document Released: 08/02/2010 Document Revised: 06/02/2017 Document Reviewed: 02/26/2015 Elsevier Interactive Patient Education  2019 [employer]. Bacterial Vaginosis  Bacterial vaginosis is a vaginal infection that occurs when the normal  balance of bacteria in the vagina is disrupted. It results from an overgrowth of certain bacteria. This is the most common vaginal infection among women ages 22-44. Because bacterial vaginosis increases your risk for STIs (sexually transmitted infections), getting treated can help reduce your risk for chlamydia, gonorrhea, herpes, and HIV (human immunodeficiency virus). Treatment is also important for preventing complications in pregnant women, because this condition can cause an early (premature) delivery. What are the causes? This condition is caused by an increase in harmful bacteria that are normally present in small amounts in the vagina. However, the reason that the condition develops is not fully understood. What increases the risk? The following factors may make you more likely to develop this condition:  Having a new sexual partner or multiple sexual partners.  Having unprotected sex.  Douching.  Having an intrauterine device (IUD).  Smoking.  Drug and alcohol abuse.  Taking certain antibiotic medicines.  Being pregnant. You cannot get bacterial vaginosis from toilet seats, bedding, swimming pools, or contact with objects around you. What are the signs or symptoms? Symptoms of this condition include:  Grey or white vaginal discharge. The discharge can also be watery or foamy.  A fish-like odor with discharge, especially after sexual intercourse or during menstruation.  Itching in and around the vagina.  Burning or pain with urination. Some women with bacterial vaginosis have no signs or symptoms. How is this diagnosed? This condition is diagnosed based on:  Your medical history.  A physical exam of the vagina.  Testing a sample of vaginal fluid under a microscope to look for a large amount of bad bacteria or abnormal cells. Your health care provider may use a cotton swab or a small wooden spatula to collect the sample. How is this treated? This condition is treated  with antibiotics. These may be given as a pill, a vaginal cream, or a medicine that is put into the vagina (suppository). If the condition comes back after treatment, a second round of antibiotics may be needed. Follow these instructions at home: Medicines  Take over-the-counter and prescription medicines only as told by your health care provider.  Take or use your antibiotic as told by your health care provider. Do not stop taking or using the antibiotic even if you start to feel better. General instructions  If you have a female sexual partner, tell her that you have a vaginal infection. She should see her health care provider and be treated if she has symptoms. If you have a female sexual partner, he does not need treatment.  During treatment: ? Avoid sexual activity until you finish treatment. ? Do not douche. ? Avoid alcohol as directed by your health care provider. ? Avoid breastfeeding as directed by your health care provider.  Drink enough water and fluids to keep your urine clear or pale yellow.  Keep the area around your vagina and rectum clean. ? Wash the area daily with warm water. ? Wipe yourself from front to back after using the toilet.  Keep all follow-up visits as told by your health care provider. This is important.  How is this prevented?  Do not douche.  Wash the outside of your vagina with warm water only.  Use protection when having sex. This includes latex condoms and dental dams.  Limit how many sexual partners you have. To help prevent bacterial vaginosis, it is best to have sex with just one partner (monogamous).  Make sure you and your sexual partner are tested for STIs.  Wear cotton or cotton-lined underwear.  Avoid wearing tight pants and pantyhose, especially during summer.  Limit the amount of alcohol that you drink.  Do not use any products that contain nicotine or tobacco, such as cigarettes and e-cigarettes. If you need help quitting, ask your  health care provider.  Do not use illegal drugs. Where to find more information  Centers for Disease Control and Prevention: SolutionApps.co.za  American Sexual Health Association (ASHA): www.ashastd.org  U.S. Department of Health and Health and safety inspector, Office on Women's Health: ConventionalMedicines.si or http://www.anderson-williamson.info/ Contact a health care provider if:  Your symptoms do not improve, even after treatment.  You have more discharge or pain when urinating.  You have a fever.  You have pain in your abdomen.  You have pain during sex.  You have vaginal bleeding between periods. Summary  Bacterial vaginosis is a vaginal infection that occurs when the normal balance of bacteria in the vagina is disrupted.  Because bacterial vaginosis increases your risk for STIs (sexually transmitted infections), getting treated can help reduce your risk for chlamydia, gonorrhea, herpes, and HIV (human immunodeficiency virus). Treatment is also important for preventing complications in pregnant women, because the condition can cause an early (premature) delivery.  This condition is treated with antibiotic medicines. These may be given as a pill, a vaginal cream, or a medicine that is put into the vagina (suppository). This information is not intended to replace advice given to you by your health care provider. Make sure you discuss any questions you have with your health care provider. Document Released: 04/07/2005 Document Revised: 08/11/2016 Document Reviewed: 12/22/2015 Elsevier Interactive Patient Education  2019 [employer].

## 2018-06-04 NOTE — MAU Note (Signed)
Presents with c/o abdominal cramping, denies VB or LOF.  Reports +FM.  Took Tylenol @ 1430, some relief noted. Drinking fluids, 2 bottles H2O thus far.

## 2018-06-09 ENCOUNTER — Telehealth: Payer: Self-pay

## 2018-06-09 MED ORDER — FLUCONAZOLE 150 MG PO TABS: 150.0000 mg | ORAL_TABLET | Freq: Once | ORAL | 0 refills | Status: AC

## 2018-06-09 NOTE — Telephone Encounter (Signed)
TC from c/o "cottage-cheese clumpy" vaginal discharge after wiping. Pt informed she has a yeast infection and it is common with recent ABx use. Pt has one more treatment left of Metrogel for BV. Consulted with CNM, Diflucan sent to pharmacy.   During call, pt asked about preterm delivery test from last week. Consulted with provider, informed pt she is not at risk for preterm delivery at this time. PTL precautions reiterated with patient.

## 2018-06-14 ENCOUNTER — Encounter: Payer: 59 | Admitting: Advanced Practice Midwife

## 2018-06-14 NOTE — Progress Notes (Deleted)
   PRENATAL VISIT NOTE  Subjective:  Holly Brown is a 33 y.o. [phone] at [redacted]w[redacted]d being seen today for ongoing prenatal care.  She is currently monitored for the following issues for this {Blank single:19197::"high-risk","low-risk"} pregnancy and has Syncope; Heart palpitations; Family history of colon cancer; Gonorrhea affecting pregnancy in first trimester; Cervical high risk HPV (human papillomavirus) test positive; Supervision of high risk pregnancy, antepartum; History of preterm delivery, currently pregnant; and Urinary tract infection affecting care of mother in first trimester, antepartum on their problem list.  Patient reports {sx:14538}.   .  .   . Denies leaking of fluid.   The following portions of the patient's history were reviewed and updated as appropriate: allergies, current medications, past family history, past medical history, past social history, past surgical history and problem list. Problem list updated.  Objective:  There were no vitals filed for this visit.  Fetal Status:           General:  Alert, oriented and cooperative. Patient is in no acute distress.  Skin: Skin is warm and dry. No rash noted.   Cardiovascular: Normal heart rate noted  Respiratory: Normal respiratory effort, no problems with respiration noted  Abdomen: Soft, gravid, appropriate for gestational age.        Pelvic: {Blank single:19197::"Cervical exam performed","Cervical exam deferred"}        Extremities: Normal range of motion.     Mental Status: Normal mood and affect. Normal behavior. Normal judgment and thought content.   Assessment and Plan:  Pregnancy: [id number] at [redacted]w[redacted]d  1. Supervision of high risk pregnancy, antepartum ***  2. History of preterm delivery, currently pregnant ***  3. Urinary tract infection affecting pregnancy ***  {Blank single:19197::"Term","Preterm"} labor symptoms and general obstetric precautions including but not limited to vaginal bleeding,  contractions, leaking of fluid and fetal movement were reviewed in detail with the patient. Please refer to After Visit Summary for other counseling recommendations.  No follow-ups on file.  No future appointments.  Sharen Counter, CNM

## 2018-06-16 ENCOUNTER — Other Ambulatory Visit: Payer: 59

## 2018-06-16 ENCOUNTER — Encounter: Payer: 59 | Admitting: Nurse Practitioner

## 2018-06-22 ENCOUNTER — Other Ambulatory Visit: Payer: 59

## 2018-06-22 ENCOUNTER — Ambulatory Visit (INDEPENDENT_AMBULATORY_CARE_PROVIDER_SITE_OTHER): Payer: 59 | Admitting: Advanced Practice Midwife

## 2018-06-22 VITALS — BP 117/77 | HR 91 | Wt 254.4 lb

## 2018-06-22 DIAGNOSIS — O0993 Supervision of high risk pregnancy, unspecified, third trimester: Secondary | ICD-10-CM

## 2018-06-22 DIAGNOSIS — O0990 Supervision of high risk pregnancy, unspecified, unspecified trimester: Secondary | ICD-10-CM

## 2018-06-22 DIAGNOSIS — Z23 Encounter for immunization: Secondary | ICD-10-CM | POA: Diagnosis not present

## 2018-06-22 DIAGNOSIS — O09213 Supervision of pregnancy with history of pre-term labor, third trimester: Secondary | ICD-10-CM

## 2018-06-22 DIAGNOSIS — Z3A29 29 weeks gestation of pregnancy: Secondary | ICD-10-CM

## 2018-06-22 DIAGNOSIS — O2340 Unspecified infection of urinary tract in pregnancy, unspecified trimester: Secondary | ICD-10-CM

## 2018-06-22 DIAGNOSIS — O2343 Unspecified infection of urinary tract in pregnancy, third trimester: Secondary | ICD-10-CM

## 2018-06-22 DIAGNOSIS — O09219 Supervision of pregnancy with history of pre-term labor, unspecified trimester: Secondary | ICD-10-CM

## 2018-06-22 DIAGNOSIS — O09899 Supervision of other high risk pregnancies, unspecified trimester: Secondary | ICD-10-CM

## 2018-06-22 NOTE — Addendum Note (Signed)
Addended by: Sharen Counter A on: 06/22/2018 09:23 AM   Modules accepted: Orders

## 2018-06-22 NOTE — Patient Instructions (Signed)
Tdap Vaccine (Tetanus, Diphtheria and Pertussis): What You Need to Know  1. Why get vaccinated?  Tetanus, diphtheria and pertussis are very serious diseases. Tdap vaccine can protect us from these diseases. And, Tdap vaccine given to pregnant women can protect newborn babies against pertussis..  TETANUS (Lockjaw) is rare in the [place] today. It causes painful muscle tightening and stiffness, usually all over the body.  · It can lead to tightening of muscles in the head and neck so you can't open your mouth, swallow, or sometimes even breathe. Tetanus kills about 1 out of 10 people who are infected even after receiving the best medical care.  DIPHTHERIA is also rare in the [place] today. It can cause a thick coating to form in the back of the throat.  · It can lead to breathing problems, heart failure, paralysis, and death.  PERTUSSIS (Whooping Cough) causes severe coughing spells, which can cause difficulty breathing, vomiting and disturbed sleep.  · It can also lead to weight loss, incontinence, and rib fractures. Up to 2 in 100 adolescents and 5 in 100 adults with pertussis are hospitalized or have complications, which could include pneumonia or death.  These diseases are caused by bacteria. Diphtheria and pertussis are spread from person to person through secretions from coughing or sneezing. Tetanus enters the body through cuts, scratches, or wounds.  Before vaccines, as many as 200,000 cases of diphtheria, 200,000 cases of pertussis, and hundreds of cases of tetanus, were reported in the [place] each year. Since vaccination began, reports of cases for tetanus and diphtheria have dropped by about 99% and for pertussis by about 80%.  2. Tdap vaccine  Tdap vaccine can protect adolescents and adults from tetanus, diphtheria, and pertussis. One dose of Tdap is routinely given at age 11 or 12. People who did not get Tdap at that age should get it as soon as possible.  Tdap is especially important  for healthcare professionals and anyone having close contact with a baby younger than 12 months.  Pregnant women should get a dose of Tdap during every pregnancy, to protect the newborn from pertussis. Infants are most at risk for severe, life-threatening complications from pertussis.  Another vaccine, called Td, protects against tetanus and diphtheria, but not pertussis. A Td booster should be given every 10 years. Tdap may be given as one of these boosters if you have never gotten Tdap before. Tdap may also be given after a severe cut or burn to prevent tetanus infection.  Your doctor or the person giving you the vaccine can give you more information.  Tdap may safely be given at the same time as other vaccines.  3. Some people should not get this vaccine  · A person who has ever had a life-threatening allergic reaction after a previous dose of any diphtheria, tetanus or pertussis containing vaccine, OR has a severe allergy to any part of this vaccine, should not get Tdap vaccine. Tell the person giving the vaccine about any severe allergies.  · Anyone who had coma or long repeated seizures within 7 days after a childhood dose of DTP or DTaP, or a previous dose of Tdap, should not get Tdap, unless a cause other than the vaccine was found. They can still get Td.  · Talk to your doctor if you:  ? have seizures or another nervous system problem,  ? had severe pain or swelling after any vaccine containing diphtheria, tetanus or pertussis,  ? ever had a condition   called Guillain-Barré Syndrome (GBS),  ? aren't feeling well on the day the shot is scheduled.  4. Risks  With any medicine, including vaccines, there is a chance of side effects. These are usually mild and go away on their own. Serious reactions are also possible but are rare.  Most people who get Tdap vaccine do not have any problems with it.  Mild problems following Tdap  (Did not interfere with activities)  · Pain where the shot was given (about 3 in 4  adolescents or 2 in 3 adults)  · Redness or swelling where the shot was given (about 1 person in 5)  · Mild fever of at least 100.4°F (up to about 1 in 25 adolescents or 1 in 100 adults)  · Headache (about 3 or 4 people in 10)  · Tiredness (about 1 person in 3 or 4)  · Nausea, vomiting, diarrhea, stomach ache (up to 1 in 4 adolescents or 1 in 10 adults)  · Chills, sore joints (about 1 person in 10)  · Body aches (about 1 person in 3 or 4)  · Rash, swollen glands (uncommon)  Moderate problems following Tdap  (Interfered with activities, but did not require medical attention)  · Pain where the shot was given (up to 1 in 5 or 6)  · Redness or swelling where the shot was given (up to about 1 in 16 adolescents or 1 in 12 adults)  · Fever over 102°F (about 1 in 100 adolescents or 1 in 250 adults)  · Headache (about 1 in 7 adolescents or 1 in 10 adults)  · Nausea, vomiting, diarrhea, stomach ache (up to 1 or 3 people in 100)  · Swelling of the entire arm where the shot was given (up to about 1 in 500).  Severe problems following Tdap  (Unable to perform usual activities; required medical attention)  · Swelling, severe pain, bleeding and redness in the arm where the shot was given (rare).  Problems that could happen after any vaccine:  · People sometimes faint after a medical procedure, including vaccination. Sitting or lying down for about 15 minutes can help prevent fainting, and injuries caused by a fall. Tell your doctor if you feel dizzy, or have vision changes or ringing in the ears.  · Some people get severe pain in the shoulder and have difficulty moving the arm where a shot was given. This happens very rarely.  · Any medication can cause a severe allergic reaction. Such reactions from a vaccine are very rare, estimated at fewer than 1 in a million doses, and would happen within a few minutes to a few hours after the vaccination.  As with any medicine, there is a very remote chance of a vaccine causing a serious  injury or death.  The safety of vaccines is always being monitored. For more information, visit: www.cdc.gov/vaccinesafety/  5. What if there is a serious problem?  What should I look for?  · Look for anything that concerns you, such as signs of a severe allergic reaction, very high fever, or unusual behavior.  Signs of a severe allergic reaction can include hives, swelling of the face and throat, difficulty breathing, a fast heartbeat, dizziness, and weakness. These would usually start a few minutes to a few hours after the vaccination.  What should I do?  · If you think it is a severe allergic reaction or other emergency that can't wait, call 9-1-1 or get the person to the nearest hospital. Otherwise,   call your doctor.  · Afterward, the reaction should be reported to the Vaccine Adverse Event Reporting System (VAERS). Your doctor might file this report, or you can do it yourself through the VAERS web site at www.vaers.hhs.gov, or by calling 1-800-822-7967.  VAERS does not give medical advice.  6. The National Vaccine Injury Compensation Program  The National Vaccine Injury Compensation Program (VICP) is a federal program that was created to compensate people who may have been injured by certain vaccines.  Persons who believe they may have been injured by a vaccine can learn about the program and about filing a claim by calling 1-800-338-2382 or visiting the VICP website at www.hrsa.gov/vaccinecompensation. There is a time limit to file a claim for compensation.  7. How can I learn more?  · Ask your doctor. He or she can give you the vaccine package insert or suggest other sources of information.  · Call your local or state health department.  · Contact the Centers for Disease Control and Prevention (CDC):  ? Call 1-800-232-4636 (1-800-CDC-INFO) or  ? Visit CDC's website at www.cdc.gov/vaccines  Vaccine Information Statement Tdap Vaccine (06/14/2013)  This information is not intended to replace advice given to you  by your health care provider. Make sure you discuss any questions you have with your health care provider.  Document Released: 10/07/2011 Document Revised: 11/23/2017 Document Reviewed: 11/23/2017  Elsevier Interactive Patient Education © 2019 [employer].

## 2018-06-22 NOTE — Addendum Note (Signed)
Addended by: Areta Haber B on: 06/22/2018 09:32 AM   Modules accepted: Orders

## 2018-06-22 NOTE — Progress Notes (Signed)
   PRENATAL VISIT NOTE  Subjective:  Holly Brown is a 33 y.o. [phone] at [redacted]w[redacted]d being seen today for ongoing prenatal care.  She is currently monitored for the following issues for this high-risk pregnancy and has Syncope; Heart palpitations; Family history of colon cancer; Gonorrhea affecting pregnancy in first trimester; Cervical high risk HPV (human papillomavirus) test positive; Supervision of high risk pregnancy, antepartum; History of preterm delivery, currently pregnant; and Urinary tract infection affecting care of mother in first trimester, antepartum on their problem list.  Patient reports no complaints.  Contractions: Not present. Vag. Bleeding: None.  Movement: Present. Denies leaking of fluid.   The following portions of the patient's history were reviewed and updated as appropriate: allergies, current medications, past family history, past medical history, past social history, past surgical history and problem list. Problem list updated.  Objective:   Vitals:   06/22/18 0838  BP: 117/77  Pulse: 91  Weight: 115.4 kg    Fetal Status: Fetal Heart Rate (bpm): 140   Movement: Present     General:  Alert, oriented and cooperative. Patient is in no acute distress.  Skin: Skin is warm and dry. No rash noted.   Cardiovascular: Normal heart rate noted  Respiratory: Normal respiratory effort, no problems with respiration noted  Abdomen: Soft, gravid, appropriate for gestational age.  Pain/Pressure: Present     Pelvic: Cervical exam deferred        Extremities: Normal range of motion.  Edema: None  Mental Status: Normal mood and affect. Normal behavior. Normal judgment and thought content.   Assessment and Plan:  Pregnancy: [id number] at [redacted]w[redacted]d  1. Supervision of high risk pregnancy, antepartum --Anticipatory guidance about next visits/weeks of pregnancy given.  2. History of preterm delivery, currently pregnant --No contractions or cramping today.  3. UTI (urinary tract  infection) in pregnancy, antepartum --On daily Macrobid for suppression  Preterm labor symptoms and general obstetric precautions including but not limited to vaginal bleeding, contractions, leaking of fluid and fetal movement were reviewed in detail with the patient. Please refer to After Visit Summary for other counseling recommendations.  Return in about 2 weeks (around 07/06/2018).  No future appointments.  Sharen Counter, CNM

## 2018-06-23 ENCOUNTER — Other Ambulatory Visit: Payer: Self-pay

## 2018-06-23 DIAGNOSIS — O24419 Gestational diabetes mellitus in pregnancy, unspecified control: Secondary | ICD-10-CM

## 2018-06-23 LAB — RPR: RPR Ser Ql: NONREACTIVE

## 2018-06-23 LAB — CBC
HEMATOCRIT: 32.2 % — AB (ref 34.0–46.6)
Hemoglobin: 11.3 g/dL (ref 11.1–15.9)
MCH: 30.9 pg (ref 26.6–33.0)
MCHC: 35.1 g/dL (ref 31.5–35.7)
MCV: 88 fL (ref 79–97)
Platelets: 342 10*3/uL (ref 150–450)
RBC: 3.66 x10E6/uL — AB (ref 3.77–5.28)
RDW: 12.4 % (ref 11.7–15.4)
WBC: 9.1 10*3/uL (ref 3.4–10.8)

## 2018-06-23 LAB — GLUCOSE TOLERANCE, 2 HOURS W/ 1HR
Glucose, 1 hour: 174 mg/dL (ref 65–179)
Glucose, 2 hour: 125 mg/dL (ref 65–152)
Glucose, Fasting: 96 mg/dL — ABNORMAL HIGH (ref 65–91)

## 2018-06-23 LAB — HIV ANTIBODY (ROUTINE TESTING W REFLEX): HIV Screen 4th Generation wRfx: NONREACTIVE

## 2018-06-23 MED ORDER — BLOOD GLUCOSE MONITORING SUPPL DEVI: 0 refills | Status: DC

## 2018-06-23 MED ORDER — GLUCOSE BLOOD VI STRP: ORAL_STRIP | 12 refills | Status: DC

## 2018-06-23 MED ORDER — ACCU-CHEK FASTCLIX LANCETS MISC: 1.0000 [IU] | Freq: Four times a day (QID) | 12 refills | Status: DC

## 2018-06-24 ENCOUNTER — Other Ambulatory Visit: Payer: Self-pay

## 2018-06-24 DIAGNOSIS — O24419 Gestational diabetes mellitus in pregnancy, unspecified control: Secondary | ICD-10-CM

## 2018-06-24 MED ORDER — GLUCOSE BLOOD VI STRP: ORAL_STRIP | 12 refills | Status: DC

## 2018-06-27 ENCOUNTER — Inpatient Hospital Stay (HOSPITAL_COMMUNITY)
Admission: AD | Admit: 2018-06-27 | Discharge: 2018-06-27 | Disposition: A | Discharge status: Home / Self Care | Payer: 59 | Attending: Obstetrics & Gynecology | Admitting: Obstetrics & Gynecology

## 2018-06-27 ENCOUNTER — Other Ambulatory Visit: Payer: Self-pay

## 2018-06-27 ENCOUNTER — Encounter (HOSPITAL_COMMUNITY): Payer: Self-pay

## 2018-06-27 DIAGNOSIS — O2343 Unspecified infection of urinary tract in pregnancy, third trimester: Secondary | ICD-10-CM | POA: Diagnosis not present

## 2018-06-27 DIAGNOSIS — Z79899 Other long term (current) drug therapy: Secondary | ICD-10-CM | POA: Insufficient documentation

## 2018-06-27 DIAGNOSIS — Z87891 Personal history of nicotine dependence: Secondary | ICD-10-CM | POA: Diagnosis not present

## 2018-06-27 DIAGNOSIS — Z3A29 29 weeks gestation of pregnancy: Secondary | ICD-10-CM | POA: Diagnosis not present

## 2018-06-27 DIAGNOSIS — O98313 Other infections with a predominantly sexual mode of transmission complicating pregnancy, third trimester: Secondary | ICD-10-CM | POA: Diagnosis not present

## 2018-06-27 DIAGNOSIS — Z87440 Personal history of urinary (tract) infections: Secondary | ICD-10-CM | POA: Diagnosis not present

## 2018-06-27 DIAGNOSIS — A6000 Herpesviral infection of urogenital system, unspecified: Secondary | ICD-10-CM | POA: Diagnosis not present

## 2018-06-27 LAB — URINALYSIS, ROUTINE W REFLEX MICROSCOPIC
Bilirubin Urine: NEGATIVE
Glucose, UA: >=500 mg/dL — AB
Hgb urine dipstick: NEGATIVE
Ketones, ur: NEGATIVE mg/dL
Nitrite: POSITIVE — AB
Protein, ur: 30 mg/dL — AB
Specific Gravity, Urine: 1.02 (ref 1.005–1.030)
WBC, UA: >50 WBC/hpf — ABNORMAL HIGH (ref 0–5)
pH: 5 (ref 5.0–8.0)

## 2018-06-27 LAB — WET PREP, GENITAL
Clue Cells Wet Prep HPF POC: NONE SEEN
Sperm: NONE SEEN
Trich, Wet Prep: NONE SEEN
Yeast Wet Prep HPF POC: NONE SEEN

## 2018-06-27 MED ORDER — CEPHALEXIN 500 MG PO CAPS: 500.0000 mg | ORAL_CAPSULE | Freq: Four times a day (QID) | ORAL | 0 refills | Status: DC

## 2018-06-27 MED ORDER — VALACYCLOVIR HCL 1 G PO TABS: 1000.0000 mg | ORAL_TABLET | Freq: Three times a day (TID) | ORAL | 0 refills | Status: DC

## 2018-06-27 NOTE — MAU Provider Note (Signed)
History     CSN: [id number]  Arrival date and time: 06/27/18 3382   First Provider Initiated Contact with Patient 06/27/18 1706      Chief Complaint  Patient presents with  . Cut on labia   HPI Holly Brown is a 33 y.o. [phone] at [redacted]w[redacted]d who presents with vaginal pain. She states she has a cut on her labia that she thinks is from her finger nails. She states it is painful and hurts with urination. She denies any leaking or bleeding. Reports normal fetal movement.   OB History    Gravida  5   Para  3   Term  2   Preterm  1   AB  1   Living  3     SAB  1   TAB      Ectopic      Multiple      Live Births  3           Past Medical History:  Diagnosis Date  . Migraines   . Seizures (HCC)    Jan.2017    Past Surgical History:  Procedure Laterality Date  . DILATION AND CURETTAGE OF UTERUS    . WISDOM TOOTH EXTRACTION  2011    Family History  Problem Relation Age of Onset  . Colon cancer Father   . Hypertension Maternal Grandmother   . Diabetes Maternal Grandmother   . Hypertension Paternal Grandmother   . Diabetes Mother     Social History   Tobacco Use  . Smoking status: Former Smoker    Packs/day: 0.25    Types: Cigarettes    Start date: 12/21/2011  . Smokeless tobacco: Never Used  Substance Use Topics  . Alcohol use: Not Currently    Alcohol/week: 0.0 standard drinks    Comment: ocassionally   . Drug use: No    Allergies: No Known Allergies  Medications Prior to Admission  Medication Sig Dispense Refill Last Dose  . cyclobenzaprine (FLEXERIL) 10 MG tablet Take 0.5-1 tablets (5-10 mg total) by mouth 3 (three) times daily as needed for muscle spasms. (Patient taking differently: Take 5-10 mg by mouth 3 (three) times daily as needed for muscle spasms. ) 20 tablet 0 Past Week at Unknown time  . nitrofurantoin, macrocrystal-monohydrate, (MACROBID) 100 MG capsule Take 1 capsule (100 mg total) by mouth 2 (two) times daily. For 7 days, then  daily as prescribed. 30 capsule 5 Past Month at Unknown time  . ACCU-CHEK FASTCLIX LANCETS MISC 1 Units by Percutaneous route 4 (four) times daily. 100 each 12   . Blood Glucose Monitoring Suppl DEVI Use as directed 4 times a day. 1 each 0   . glucose blood (ACCU-CHEK GUIDE) test strip Use to check blood sugars four times a day was instructed 100 each 12   . glucose blood test strip Use as instructed 100 each 12     Review of Systems  Constitutional: Negative.  Negative for fatigue and fever.  HENT: Negative.   Respiratory: Negative.  Negative for shortness of breath.   Cardiovascular: Negative.  Negative for chest pain.  Gastrointestinal: Negative.  Negative for abdominal pain, constipation, diarrhea, nausea and vomiting.  Genitourinary: Positive for vaginal pain. Negative for dysuria, vaginal bleeding and vaginal discharge.  Neurological: Negative.  Negative for dizziness and headaches.   Physical Exam   Blood pressure 126/70, pulse (!) 108, temperature 98.5 F (36.9 C), temperature source Oral, resp. rate 18, height 5\' 11"  (1.803 m), weight 114.1  kg, last menstrual period 11/20/2017, SpO2 99 %.  Physical Exam  Nursing note and vitals reviewed. Constitutional: She is oriented to person, place, and time. She appears well-developed and well-nourished. No distress.  HENT:  Head: Normocephalic.  Eyes: Pupils are equal, round, and reactive to light.  Cardiovascular: Normal rate, regular rhythm and normal heart sounds.  Respiratory: Effort normal and breath sounds normal. No respiratory distress.  GI: Soft. Bowel sounds are normal. She exhibits no distension. There is no abdominal tenderness.  Genitourinary:       Genitourinary Comments: Ulcerated lesions on internal vaginal walls. Small amount of creamy yellow discharge   Neurological: She is alert and oriented to person, place, and time.  Skin: Skin is warm and dry.  Psychiatric: She has a normal mood and affect. Her behavior is  normal. Judgment and thought content normal.    Fetal Tracing:  Baseline: 150 Variability: moderate Accels: 15x15 Decels: none  Toco: none   MAU Course  Procedures Results for orders placed or performed during the hospital encounter of 06/27/18 (from the past 24 hour(s))  Urinalysis, Routine w reflex microscopic     Status: Abnormal   Collection Time: 06/27/18  5:00 PM  Result Value Ref Range   Color, Urine AMBER (A) YELLOW   APPearance CLOUDY (A) CLEAR   Specific Gravity, Urine 1.020 1.005 - 1.030   pH 5.0 5.0 - 8.0   Glucose, UA >=500 (A) NEGATIVE mg/dL   Hgb urine dipstick NEGATIVE NEGATIVE   Bilirubin Urine NEGATIVE NEGATIVE   Ketones, ur NEGATIVE NEGATIVE mg/dL   Protein, ur 30 (A) NEGATIVE mg/dL   Nitrite POSITIVE (A) NEGATIVE   Leukocytes,Ua LARGE (A) NEGATIVE   RBC / HPF 6-10 0 - 5 RBC/hpf   WBC, UA >50 (H) 0 - 5 WBC/hpf   Bacteria, UA FEW (A) NONE SEEN   Squamous Epithelial / LPF 11-20 0 - 5   WBC Clumps PRESENT    Mucus PRESENT   Wet prep, genital     Status: Abnormal   Collection Time: 06/27/18  5:05 PM  Result Value Ref Range   Yeast Wet Prep HPF POC NONE SEEN NONE SEEN   Trich, Wet Prep NONE SEEN NONE SEEN   Clue Cells Wet Prep HPF POC NONE SEEN NONE SEEN   WBC, Wet Prep HPF POC MANY (A) NONE SEEN   Sperm NONE SEEN      MDM UA NST reactive HSV PCR Wet prep and gc/chlamydia  Likely primary HSV outbreak. Culture collected and will plan to treat  Patient not taking UTI suppression as prescribed. UA positive for UTI again today. Lengthy discussion with patient about importance of daily supression and risks of recurrent UTIs. No symptoms today but will treat and culture UA.  Assessment and Plan   1. Primary genital herpes simplex infection   2. Recurrent urinary tract infection affecting pregnancy in third trimester   3. [redacted] weeks gestation of pregnancy    -Discharge home in stable condition -Rx for keflex, valtrex sent to patient's pharmacy -HSV  and UTI precautions discussed -Patient advised to follow-up with [facility] as scheduled for prenatal care -Patient may return to MAU as needed or if her condition were to change or worsen   Rolm Bookbinder CNM 06/27/2018, 5:06 PM

## 2018-06-27 NOTE — MAU Note (Signed)
Holly Brown is a 33 y.o. at [redacted]w[redacted]d here in MAU reporting: pt reports that she thinks she cut herself on her labia and now it burns when she pees. States the urine and also soap burns the area. No other UTI symptoms. No abdominal pain, vaginal bleeding, or LOF. + FM  Onset of complaint: Thursday  Pain score: 0/10  Vitals:   06/27/18 1640  BP: 126/70  Pulse: (!) 108  Resp: 18  Temp: 98.5 F (36.9 C)  SpO2: 99%      FHT:+ FM  Lab orders placed from triage: UA

## 2018-06-27 NOTE — Discharge Instructions (Signed)
Safe Medications in Pregnancy   Acne: Benzoyl Peroxide Salicylic Acid  Backache/Headache: Tylenol: 2 regular strength every 4 hours OR              2 Extra strength every 6 hours  Colds/Coughs/Allergies: Benadryl (alcohol free) 25 mg every 6 hours as needed Breath right strips Claritin Cepacol throat lozenges Chloraseptic throat spray Cold-Eeze- up to three times per day Cough drops, alcohol free Flonase (by prescription only) Guaifenesin Mucinex Robitussin DM (plain only, alcohol free) Saline nasal spray/drops Sudafed (pseudoephedrine) & Actifed ** use only after [redacted] weeks gestation and if you do not have high blood pressure Tylenol Vicks Vaporub Zinc lozenges Zyrtec   Constipation: Colace Ducolax suppositories Fleet enema Glycerin suppositories Metamucil Milk of magnesia Miralax Senokot Smooth move tea  Diarrhea: Kaopectate Imodium A-D  *NO pepto Bismol  Hemorrhoids: Anusol Anusol HC Preparation H Tucks  Indigestion: Tums Maalox Mylanta Zantac  Pepcid  Insomnia: Benadryl (alcohol free) 25mg  every 6 hours as needed Tylenol PM Unisom, no Gelcaps  Leg Cramps: Tums MagGel  Nausea/Vomiting:  Bonine Dramamine Emetrol Ginger extract Sea bands Meclizine  Nausea medication to take during pregnancy:  Unisom (doxylamine succinate 25 mg tablets) Take one tablet daily at bedtime. If symptoms are not adequately controlled, the dose can be increased to a maximum recommended dose of two tablets daily (1/2 tablet in the morning, 1/2 tablet mid-afternoon and one at bedtime). Vitamin B6 100mg  tablets. Take one tablet twice a day (up to 200 mg per day).  Skin Rashes: Aveeno products Benadryl cream or 25mg  every 6 hours as needed Calamine Lotion 1% cortisone cream  Yeast infection: Gyne-lotrimin 7 Monistat 7   **If taking multiple medications, please check labels to avoid duplicating the same active ingredients **take medication as directed on  the label ** Do not exceed 4000 mg of tylenol in 24 hours **Do not take medications that contain aspirin or ibuprofen      Pregnancy and Urinary Tract Infection What is a urinary tract infection?  A urinary tract infection (UTI) is an infection of any part of the urinary tract. This includes the kidneys, the tubes that connect your kidneys to your bladder (ureters), the bladder, and the tube that carries urine out of your body (urethra). These organs make, store, and get rid of urine in the body.  An upper UTI affects the ureters and kidneys (pyelonephritis), and a lower UTI affects the bladder (cystitis) and urethra (urethritis). Most urinary tract infections are caused by bacteria in your genital area, around the entrance to your urinary tract (urethra). These bacteria grow and cause irritation and inflammation of your urinary tract. Why am I more likely to get a UTI during pregnancy? You are more likely to develop a UTI during pregnancy because:  The physical and hormonal changes your body goes through can make it easier for bacteria to get into your urinary tract.  Your growing baby puts pressure on your uterus and can affect urine flow. Does a UTI place my baby at risk? An untreated UTI during pregnancy could lead to a kidney infection, which can cause health problems that could affect your baby. Possible complications of an untreated UTI include:  Having your baby before 37 weeks of pregnancy (premature).  Having a baby with a low birth weight.  Developing high blood pressure during pregnancy (preeclampsia).  Having a low hemoglobin level (anemia). What are the symptoms of a UTI? Symptoms of a UTI include:  Needing to urinate right away (urgently).  Frequent urination or passing small amounts of urine frequently.  Pain or burning with urination.  Blood in the urine.  Urine that smells bad or unusual.  Trouble urinating.  Cloudy urine.  Pain in the abdomen or lower  back.  Vaginal discharge. You may also have:  Vomiting or a decreased appetite.  Confusion.  Irritability or tiredness.  A fever.  Diarrhea. What are the treatment options for a UTI during pregnancy? Treatment for this condition may include:  Antibiotic medicines that are safe to take during pregnancy.  Other medicines to treat less common causes of UTI. How can I prevent a UTI? To prevent a UTI:  Go to the bathroom as soon as you feel the need. Do not hold urine for long periods of time.  Always wipe from front to back after a bowel movement. Use each tissue one time when you wipe.  Empty your bladder after sex.  Keep your genital area dry.  Drink 6-10 glasses of water each day.  Do not douche or use deodorant sprays. Contact a health care provider if:  Your symptoms do not improve or they get worse.  You have abnormal vaginal discharge. Get help right away if:  You have a fever.  You have nausea and vomiting.  You have back or side pain.  You feel contractions in your uterus.  You have lower belly pain.  You have a gush of fluid from your vagina.  You have blood in your urine. Summary  A urinary tract infection (UTI) is an infection of any part of the urinary tract, which includes the kidneys, ureters, bladder, and urethra.  Most urinary tract infections are caused by bacteria in your genital area, around the entrance to your urinary tract (urethra).  You are more likely to develop a UTI during pregnancy.  If you were prescribed an antibiotic, take it as told by your health care provider. Do not stop taking the antibiotic even if you start to feel better. This information is not intended to replace advice given to you by your health care provider. Make sure you discuss any questions you have with your health care provider. Document Released: 08/02/2010 Document Revised: 06/02/2017 Document Reviewed: 02/26/2015 Elsevier Interactive Patient Education   [employer].

## 2018-06-28 LAB — GC/CHLAMYDIA PROBE AMP (~~LOC~~) NOT AT ARMC
Chlamydia: NEGATIVE
Neisseria Gonorrhea: NEGATIVE

## 2018-06-29 LAB — HSV CULTURE AND TYPING

## 2018-06-30 ENCOUNTER — Ambulatory Visit: Payer: 59 | Admitting: Registered"

## 2018-07-01 LAB — CULTURE, OB URINE: Culture: >=100000 — AB

## 2018-07-02 ENCOUNTER — Other Ambulatory Visit: Payer: Self-pay

## 2018-07-02 ENCOUNTER — Telehealth: Payer: Self-pay

## 2018-07-02 NOTE — Telephone Encounter (Signed)
Patient called stating that she is not able to swallow the valtrex pills. She is wondering if there are any other options for her for treatment.

## 2018-07-03 ENCOUNTER — Encounter: Payer: Self-pay | Admitting: Advanced Practice Midwife

## 2018-07-03 ENCOUNTER — Other Ambulatory Visit: Payer: Self-pay | Admitting: Advanced Practice Midwife

## 2018-07-03 MED ORDER — VALACYCLOVIR HCL 500 MG PO TABS: 1000.0000 mg | ORAL_TABLET | Freq: Two times a day (BID) | ORAL | 0 refills | Status: DC

## 2018-07-03 NOTE — Progress Notes (Signed)
Pt not tolerating PO Valtrex.  Rx for Valtrex 500 mg, 2 tablets for total of 1000 mg, twice daily.  Changed from 1000 mg TID as previously prescribed.

## 2018-07-05 ENCOUNTER — Other Ambulatory Visit: Payer: Self-pay | Admitting: Advanced Practice Midwife

## 2018-07-05 DIAGNOSIS — O2343 Unspecified infection of urinary tract in pregnancy, third trimester: Secondary | ICD-10-CM

## 2018-07-05 DIAGNOSIS — A6000 Herpesviral infection of urogenital system, unspecified: Secondary | ICD-10-CM

## 2018-07-05 MED ORDER — CEPHALEXIN 250 MG/5ML PO SUSR: 500.0000 mg | Freq: Every day | ORAL | 2 refills | Status: AC

## 2018-07-05 MED ORDER — ACYCLOVIR 200 MG/5ML PO SUSP: 400.0000 mg | Freq: Three times a day (TID) | ORAL | 0 refills | Status: AC

## 2018-07-05 NOTE — Progress Notes (Signed)
Pt medications changed to liquid for compliance. Pt having difficulty with Macrobid and Valtrex tablets/capsules.

## 2018-07-05 NOTE — Patient Instructions (Signed)
Third Trimester of Pregnancy The third trimester is from week 28 through week 40 (months 7 through 9). The third trimester is a time when the unborn baby (fetus) is growing rapidly. At the end of the ninth month, the fetus is about 20 inches in length and weighs 6-10 pounds. Body changes during your third trimester Your body will continue to go through many changes during pregnancy. The changes vary from woman to woman. During the third trimester:  Your weight will continue to increase. You can expect to gain 25-35 pounds (11-16 kg) by the end of the pregnancy.  You may begin to get stretch marks on your hips, abdomen, and breasts.  You may urinate more often because the fetus is moving lower into your pelvis and pressing on your bladder.  You may develop or continue to have heartburn. This is caused by increased hormones that slow down muscles in the digestive tract.  You may develop or continue to have constipation because increased hormones slow digestion and cause the muscles that push waste through your intestines to relax.  You may develop hemorrhoids. These are swollen veins (varicose veins) in the rectum that can itch or be painful.  You may develop swollen, bulging veins (varicose veins) in your legs.  You may have increased body aches in the pelvis, back, or thighs. This is due to weight gain and increased hormones that are relaxing your joints.  You may have changes in your hair. These can include thickening of your hair, rapid growth, and changes in texture. Some women also have hair loss during or after pregnancy, or hair that feels dry or thin. Your hair will most likely return to normal after your baby is born.  Your breasts will continue to grow and they will continue to become tender. A yellow fluid (colostrum) may leak from your breasts. This is the first milk you are producing for your baby.  Your belly button may stick out.  You may notice more swelling in your hands,  face, or ankles.  You may have increased tingling or numbness in your hands, arms, and legs. The skin on your belly may also feel numb.  You may feel short of breath because of your expanding uterus.  You may have more problems sleeping. This can be caused by the size of your belly, increased need to urinate, and an increase in your body's metabolism.  You may notice the fetus "dropping," or moving lower in your abdomen (lightening).  You may have increased vaginal discharge.  You may notice your joints feel loose and you may have pain around your pelvic bone. What to expect at prenatal visits You will have prenatal exams every 2 weeks until week 36. Then you will have weekly prenatal exams. During a routine prenatal visit:  You will be weighed to make sure you and the baby are growing normally.  Your blood pressure will be taken.  Your abdomen will be measured to track your baby's growth.  The fetal heartbeat will be listened to.  Any test results from the previous visit will be discussed.  You may have a cervical check near your due date to see if your cervix has softened or thinned (effaced).  You will be tested for Group B streptococcus. This happens between 35 and 37 weeks. Your health care provider may ask you:  What your birth plan is.  How you are feeling.  If you are feeling the baby move.  If you have had any abnormal   symptoms, such as leaking fluid, bleeding, severe headaches, or abdominal cramping.  If you are using any tobacco products, including cigarettes, chewing tobacco, and electronic cigarettes.  If you have any questions. Other tests or screenings that may be performed during your third trimester include:  Blood tests that check for low iron levels (anemia).  Fetal testing to check the health, activity level, and growth of the fetus. Testing is done if you have certain medical conditions or if there are problems during the pregnancy.  Nonstress test  (NST). This test checks the health of your baby to make sure there are no signs of problems, such as the baby not getting enough oxygen. During this test, a belt is placed around your belly. The baby is made to move, and its heart rate is monitored during movement. What is false labor? False labor is a condition in which you feel small, irregular tightenings of the muscles in the womb (contractions) that usually go away with rest, changing position, or drinking water. These are called Braxton Hicks contractions. Contractions may last for hours, days, or even weeks before true labor sets in. If contractions come at regular intervals, become more frequent, increase in intensity, or become painful, you should see your health care provider. What are the signs of labor?  Abdominal cramps.  Regular contractions that start at 10 minutes apart and become stronger and more frequent with time.  Contractions that start on the top of the uterus and spread down to the lower abdomen and back.  Increased pelvic pressure and dull back pain.  A watery or bloody mucus discharge that comes from the vagina.  Leaking of amniotic fluid. This is also known as your "water breaking." It could be a slow trickle or a gush. Let your health care provider know if it has a color or strange odor. If you have any of these signs, call your health care provider right away, even if it is before your due date. Follow these instructions at home: Medicines  Follow your health care provider's instructions regarding medicine use. Specific medicines may be either safe or unsafe to take during pregnancy.  Take a prenatal vitamin that contains at least 600 micrograms (mcg) of folic acid.  If you develop constipation, try taking a stool softener if your health care provider approves. Eating and drinking   Eat a balanced diet that includes fresh fruits and vegetables, whole grains, good sources of protein such as meat, eggs, or tofu,  and low-fat dairy. Your health care provider will help you determine the amount of weight gain that is right for you.  Avoid raw meat and uncooked cheese. These carry germs that can cause birth defects in the baby.  If you have low calcium intake from food, talk to your health care provider about whether you should take a daily calcium supplement.  Eat four or five small meals rather than three large meals a day.  Limit foods that are high in fat and processed sugars, such as fried and sweet foods.  To prevent constipation: ? Drink enough fluid to keep your urine clear or pale yellow. ? Eat foods that are high in fiber, such as fresh fruits and vegetables, whole grains, and beans. Activity  Exercise only as directed by your health care provider. Most women can continue their usual exercise routine during pregnancy. Try to exercise for 30 minutes at least 5 days a week. Stop exercising if you experience uterine contractions.  Avoid heavy lifting.  Do   not exercise in extreme heat or humidity, or at high altitudes.  Wear low-heel, comfortable shoes.  Practice good posture.  You may continue to have sex unless your health care provider tells you otherwise. Relieving pain and discomfort  Take frequent breaks and rest with your legs elevated if you have leg cramps or low back pain.  Take warm sitz baths to soothe any pain or discomfort caused by hemorrhoids. Use hemorrhoid cream if your health care provider approves.  Wear a good support bra to prevent discomfort from breast tenderness.  If you develop varicose veins: ? Wear support pantyhose or compression stockings as told by your healthcare provider. ? Elevate your feet for 15 minutes, 3-4 times a day. Prenatal care  Write down your questions. Take them to your prenatal visits.  Keep all your prenatal visits as told by your health care provider. This is important. Safety  Wear your seat belt at all times when driving.  Make  a list of emergency phone numbers, including numbers for family, friends, the hospital, and police and fire departments. General instructions  Avoid cat litter boxes and soil used by cats. These carry germs that can cause birth defects in the baby. If you have a cat, ask someone to clean the litter box for you.  Do not travel far distances unless it is absolutely necessary and only with the approval of your health care provider.  Do not use hot tubs, steam rooms, or saunas.  Do not drink alcohol.  Do not use any products that contain nicotine or tobacco, such as cigarettes and e-cigarettes. If you need help quitting, ask your health care provider.  Do not use any medicinal herbs or unprescribed drugs. These chemicals affect the formation and growth of the baby.  Do not douche or use tampons or scented sanitary pads.  Do not cross your legs for long periods of time.  To prepare for the arrival of your baby: ? Take prenatal classes to understand, practice, and ask questions about labor and delivery. ? Make a trial run to the hospital. ? Visit the hospital and tour the maternity area. ? Arrange for maternity or paternity leave through employers. ? Arrange for family and friends to take care of pets while you are in the hospital. ? Purchase a rear-facing car seat and make sure you know how to install it in your car. ? Pack your hospital bag. ? Prepare the baby's nursery. Make sure to remove all pillows and stuffed animals from the baby's crib to prevent suffocation.  Visit your dentist if you have not gone during your pregnancy. Use a soft toothbrush to brush your teeth and be gentle when you floss. Contact a health care provider if:  You are unsure if you are in labor or if your water has broken.  You become dizzy.  You have mild pelvic cramps, pelvic pressure, or nagging pain in your abdominal area.  You have lower back pain.  You have persistent nausea, vomiting, or  diarrhea.  You have an unusual or bad smelling vaginal discharge.  You have pain when you urinate. Get help right away if:  Your water breaks before 37 weeks.  You have regular contractions less than 5 minutes apart before 37 weeks.  You have a fever.  You are leaking fluid from your vagina.  You have spotting or bleeding from your vagina.  You have severe abdominal pain or cramping.  You have rapid weight loss or weight gain.  You have   shortness of breath with chest pain.  You notice sudden or extreme swelling of your face, hands, ankles, feet, or legs.  Your baby makes fewer than 10 movements in 2 hours.  You have severe headaches that do not go away when you take medicine.  You have vision changes. Summary  The third trimester is from week 28 through week 40, months 7 through 9. The third trimester is a time when the unborn baby (fetus) is growing rapidly.  During the third trimester, your discomfort may increase as you and your baby continue to gain weight. You may have abdominal, leg, and back pain, sleeping problems, and an increased need to urinate.  During the third trimester your breasts will keep growing and they will continue to become tender. A yellow fluid (colostrum) may leak from your breasts. This is the first milk you are producing for your baby.  False labor is a condition in which you feel small, irregular tightenings of the muscles in the womb (contractions) that eventually go away. These are called Braxton Hicks contractions. Contractions may last for hours, days, or even weeks before true labor sets in.  Signs of labor can include: abdominal cramps; regular contractions that start at 10 minutes apart and become stronger and more frequent with time; watery or bloody mucus discharge that comes from the vagina; increased pelvic pressure and dull back pain; and leaking of amniotic fluid. This information is not intended to replace advice given to you by your  health care provider. Make sure you discuss any questions you have with your health care provider. Document Released: 04/01/2001 Document Revised: 05/13/2016 Document Reviewed: 05/13/2016 Elsevier Interactive Patient Education  2019 [employer]  

## 2018-07-05 NOTE — Progress Notes (Addendum)
   PRENATAL VISIT NOTE  Subjective:  Holly Brown is a 33 y.o. [phone] at [redacted]w[redacted]d being seen today for ongoing prenatal care.  She is currently monitored for the following issues for this high-risk pregnancy and has Syncope; Heart palpitations; Family history of colon cancer; Gonorrhea affecting pregnancy in first trimester; Cervical high risk HPV (human papillomavirus) test positive; Supervision of high risk pregnancy, antepartum; History of preterm delivery, currently pregnant; and Urinary tract infection affecting care of mother in first trimester, antepartum on their problem list.  Patient reports occasional contractions.  Contractions: Not present. Vag. Bleeding: None.  Movement: Present. Denies leaking of fluid.   The following portions of the patient's history were reviewed and updated as appropriate: allergies, current medications, past family history, past medical history, past social history, past surgical history and problem list.   Objective:   Vitals:   07/06/18 0836  BP: 119/77  Pulse: 98  Weight: 116.3 kg    Fetal Status: Fetal Heart Rate (bpm): 144 Fundal Height: 31 cm Movement: Present     General:  Alert, oriented and cooperative. Patient is in no acute distress.  Skin: Skin is warm and dry. No rash noted.   Cardiovascular: Normal heart rate noted  Respiratory: Normal respiratory effort, no problems with respiration noted  Abdomen: Soft, gravid, appropriate for gestational age.  Pain/Pressure: Present     Pelvic: Cervical exam deferred        Extremities: Normal range of motion.  Edema: Trace  Mental Status: Normal mood and affect. Normal behavior. Normal judgment and thought content.   Assessment and Plan:  Pregnancy: [id number] at [redacted]w[redacted]d  1. Primary genital herpes simplex infection --Initially Valtrex 1000 mg BID but pt not tolerating pills so Rx changed to 500 mg tablets, still 1000 mg dose BID.  Pt sent a message via MyChart and medication changed to liquid,  acyclovir 400 mg TID.   --Discussed risks of vaginal delivery with HSV, diagnosis at 29 weeks so depends on timing of delivery and when this episode is clear.   2. Recurrent urinary tract infection affecting pregnancy in third trimester --Poor compliance with daily abx due to difficulty taking pills so Rx changed to Keflex 500 mg Q HS in liquid form.    3. Gestational diabetes mellitus (GDM), antepartum, gestational diabetes method of control unspecified --Pt does not have log but reports normal fasting and PP glucose.   4. Supervision of high risk pregnancy, antepartum --Reviewed safety, visitor policy, reassurance about COVID-19 for pregnancy at this time.  5. History of preterm delivery, currently pregnant --Some pelvic pressure and intermittent cramping, essentially unchanged in last 2 weeks per pt.   Preterm labor symptoms and general obstetric precautions including but not limited to vaginal bleeding, contractions, leaking of fluid and fetal movement were reviewed in detail with the patient. Please refer to After Visit Summary for other counseling recommendations.   Return in about 2 weeks (around 07/20/2018).  Future Appointments  Date Time Provider Department Center  07/20/2018  9:55 AM Leftwich-Kirby, Wilmer Floor, CNM [facility] None    Sharen Counter, CNM

## 2018-07-06 ENCOUNTER — Ambulatory Visit (INDEPENDENT_AMBULATORY_CARE_PROVIDER_SITE_OTHER): Payer: 59 | Admitting: Advanced Practice Midwife

## 2018-07-06 ENCOUNTER — Other Ambulatory Visit: Payer: Self-pay

## 2018-07-06 VITALS — BP 119/77 | HR 98 | Wt 256.4 lb

## 2018-07-06 DIAGNOSIS — O0990 Supervision of high risk pregnancy, unspecified, unspecified trimester: Secondary | ICD-10-CM

## 2018-07-06 DIAGNOSIS — Z3A30 30 weeks gestation of pregnancy: Secondary | ICD-10-CM

## 2018-07-06 DIAGNOSIS — O2343 Unspecified infection of urinary tract in pregnancy, third trimester: Secondary | ICD-10-CM

## 2018-07-06 DIAGNOSIS — O98813 Other maternal infectious and parasitic diseases complicating pregnancy, third trimester: Secondary | ICD-10-CM

## 2018-07-06 DIAGNOSIS — O24419 Gestational diabetes mellitus in pregnancy, unspecified control: Secondary | ICD-10-CM

## 2018-07-06 DIAGNOSIS — A6000 Herpesviral infection of urogenital system, unspecified: Secondary | ICD-10-CM

## 2018-07-06 DIAGNOSIS — Z8751 Personal history of pre-term labor: Secondary | ICD-10-CM

## 2018-07-06 DIAGNOSIS — O09219 Supervision of pregnancy with history of pre-term labor, unspecified trimester: Secondary | ICD-10-CM

## 2018-07-06 DIAGNOSIS — O09899 Supervision of other high risk pregnancies, unspecified trimester: Secondary | ICD-10-CM

## 2018-07-06 DIAGNOSIS — O09213 Supervision of pregnancy with history of pre-term labor, third trimester: Secondary | ICD-10-CM

## 2018-07-12 ENCOUNTER — Encounter (HOSPITAL_COMMUNITY): Payer: Self-pay

## 2018-07-12 ENCOUNTER — Other Ambulatory Visit: Payer: Self-pay

## 2018-07-12 ENCOUNTER — Inpatient Hospital Stay (HOSPITAL_COMMUNITY)
Admission: AD | Admit: 2018-07-12 | Discharge: 2018-07-13 | Disposition: A | Discharge status: Home / Self Care | Payer: 59 | Source: Ambulatory Visit | Attending: Obstetrics and Gynecology | Admitting: Obstetrics and Gynecology

## 2018-07-12 DIAGNOSIS — O26893 Other specified pregnancy related conditions, third trimester: Secondary | ICD-10-CM | POA: Diagnosis not present

## 2018-07-12 DIAGNOSIS — Z79899 Other long term (current) drug therapy: Secondary | ICD-10-CM | POA: Insufficient documentation

## 2018-07-12 DIAGNOSIS — O2343 Unspecified infection of urinary tract in pregnancy, third trimester: Secondary | ICD-10-CM | POA: Insufficient documentation

## 2018-07-12 DIAGNOSIS — Z3A31 31 weeks gestation of pregnancy: Secondary | ICD-10-CM | POA: Insufficient documentation

## 2018-07-12 DIAGNOSIS — M545 Low back pain, unspecified: Secondary | ICD-10-CM

## 2018-07-12 DIAGNOSIS — Z3689 Encounter for other specified antenatal screening: Secondary | ICD-10-CM

## 2018-07-12 DIAGNOSIS — Z87891 Personal history of nicotine dependence: Secondary | ICD-10-CM | POA: Insufficient documentation

## 2018-07-12 DIAGNOSIS — Z87440 Personal history of urinary (tract) infections: Secondary | ICD-10-CM | POA: Diagnosis not present

## 2018-07-12 LAB — URINALYSIS, ROUTINE W REFLEX MICROSCOPIC
Bilirubin Urine: NEGATIVE
Glucose, UA: >=500 mg/dL — AB
Ketones, ur: NEGATIVE mg/dL
Nitrite: POSITIVE — AB
Protein, ur: NEGATIVE mg/dL
Specific Gravity, Urine: 1.01 (ref 1.005–1.030)
WBC, UA: >50 WBC/hpf — ABNORMAL HIGH (ref 0–5)
pH: 6 (ref 5.0–8.0)

## 2018-07-12 MED ORDER — ACETAMINOPHEN 325 MG PO TABS
650.0000 mg | ORAL_TABLET | Freq: Once | ORAL | Status: AC
  Administered 2018-07-12: 650 mg via ORAL
  Filled 2018-07-12: qty 2

## 2018-07-12 NOTE — MAU Note (Signed)
Pt presents to MAU with c/o pelvic pressure and back pain that started today. Pt denies VB and LOF. +FM

## 2018-07-12 NOTE — MAU Provider Note (Signed)
History     CSN: [id number]  Arrival date and time: 07/12/18 2204   First Provider Initiated Contact with Patient 07/12/18 2305      Chief Complaint  Patient presents with  . Pelvic Pain   Holly Brown is a 33 y.o. G5P3 at [id number] who presents to MAU with complaints of pelvic pain and back pain. Reports symptoms started occurring today- this morning. Describes pelvic pain as pelvic pressure that is suprapubic and intermittent. Rates pain 2/10- has not taken any medication for pelvic pain. Has a hx of UTI and currently not taking suppression medication, "does not take pills well", recently prescribed liquid version of Macrobid today. She reports constant lower back pain that started today, describes as cramping. Rates pain 2/10 as well. Patient drove self to hospital. Denies vaginal bleeding or vaginal discharge. +FM.     OB History    Gravida  5   Para  3   Term  2   Preterm  1   AB  1   Living  3     SAB  1   TAB      Ectopic      Multiple      Live Births  3           Past Medical History:  Diagnosis Date  . Migraines   . Seizures (HCC)    Jan.2017    Past Surgical History:  Procedure Laterality Date  . DILATION AND CURETTAGE OF UTERUS    . WISDOM TOOTH EXTRACTION  2011    Family History  Problem Relation Age of Onset  . Colon cancer Father   . Hypertension Maternal Grandmother   . Diabetes Maternal Grandmother   . Hypertension Paternal Grandmother   . Diabetes Mother     Social History   Tobacco Use  . Smoking status: Former Smoker    Packs/day: 0.25    Types: Cigarettes    Start date: 12/21/2011  . Smokeless tobacco: Never Used  Substance Use Topics  . Alcohol use: Not Currently    Alcohol/week: 0.0 standard drinks    Comment: ocassionally   . Drug use: No    Allergies: No Known Allergies  Medications Prior to Admission  Medication Sig Dispense Refill Last Dose  . ACCU-CHEK FASTCLIX LANCETS MISC 1 Units by Percutaneous route  4 (four) times daily. 100 each 12 07/12/2018 at Unknown time  . acyclovir (ZOVIRAX) 200 MG/5ML suspension Take 10 mLs (400 mg total) by mouth 3 (three) times daily for 7 days. 120 mL 0 07/11/2018 at Unknown time  . Blood Glucose Monitoring Suppl DEVI Use as directed 4 times a day. 1 each 0 07/12/2018 at Unknown time  . cephALEXin (KEFLEX) 250 MG/5ML suspension Take 10 mLs (500 mg total) by mouth at bedtime for 30 days. 300 mL 2 07/11/2018 at Unknown time  . cyclobenzaprine (FLEXERIL) 10 MG tablet Take 0.5-1 tablets (5-10 mg total) by mouth 3 (three) times daily as needed for muscle spasms. (Patient taking differently: Take 5-10 mg by mouth 3 (three) times daily as needed for muscle spasms. ) 20 tablet 0 Past Month at Unknown time  . glucose blood (ACCU-CHEK GUIDE) test strip Use to check blood sugars four times a day was instructed 100 each 12 07/12/2018 at Unknown time  . glucose blood test strip Use as instructed 100 each 12 07/12/2018 at Unknown time    Review of Systems  Constitutional: Negative.   Respiratory: Negative.   Cardiovascular: Negative.  Gastrointestinal: Negative.   Genitourinary: Positive for pelvic pain. Negative for difficulty urinating, dysuria, frequency, urgency, vaginal bleeding and vaginal discharge.       Pelvic pressure  Musculoskeletal: Positive for back pain.   Physical Exam   Blood pressure 121/78, pulse (!) 104, temperature 98.3 F (36.8 C), temperature source Oral, resp. rate 18, height 5\' 10"  (1.778 m), weight 115.1 kg, last menstrual period 11/20/2017.  Physical Exam  Nursing note and vitals reviewed. Constitutional: She is oriented to person, place, and time. She appears well-developed and well-nourished. No distress.  Cardiovascular: Normal rate, regular rhythm and normal heart sounds.  Respiratory: Effort normal and breath sounds normal. No respiratory distress. She has no wheezes. She has no rales.  GI: Soft. There is no abdominal tenderness. There is no  rebound and no guarding.  Gravid appropriate for gestational age  Neurological: She is alert and oriented to person, place, and time.  Psychiatric: She has a normal mood and affect. Her behavior is normal. Thought content normal.   Dilation: Closed Effacement (%): Thick Cervical Position: Posterior Exam by:: Steward Drone CNM  FHR: 140/ moderate/ +accels/ one variable decel  Toco: 1 UC/ mild by palpation  MAU Course  Procedures  MDM FFN collected but not sent  Tylenol 650mg  ordered, patient drove self to hospital  Medical screening and cervical examination- negative CVA tenderness UA positive for nitrates   Results for orders placed or performed during the hospital encounter of 07/12/18 (from the past 24 hour(s))  Urinalysis, Routine w reflex microscopic     Status: Abnormal   Collection Time: 07/12/18 10:32 PM  Result Value Ref Range   Color, Urine YELLOW YELLOW   APPearance CLOUDY (A) CLEAR   Specific Gravity, Urine 1.010 1.005 - 1.030   pH 6.0 5.0 - 8.0   Glucose, UA >=500 (A) NEGATIVE mg/dL   Hgb urine dipstick MODERATE (A) NEGATIVE   Bilirubin Urine NEGATIVE NEGATIVE   Ketones, ur NEGATIVE NEGATIVE mg/dL   Protein, ur NEGATIVE NEGATIVE mg/dL   Nitrite POSITIVE (A) NEGATIVE   Leukocytes,Ua LARGE (A) NEGATIVE   RBC / HPF 21-50 0 - 5 RBC/hpf   WBC, UA >50 (H) 0 - 5 WBC/hpf   Bacteria, UA MANY (A) NONE SEEN   Squamous Epithelial / LPF 0-5 0 - 5   WBC Clumps PRESENT    UA positive for UTI again today. Educated and discussed with patient importance of daily suppression and risk of recurrent UTIs, patient verbalizes understanding and plans to start taking liquid form.   Discussed reasons to return to MAU. Encouraged use of maternity support belt and taking daily suppression for UTI. Follow up as scheduled for prenatal appointments. Pt stable at time of discharge.   Assessment and Plan   1. Recurrent urinary tract infection affecting pregnancy in third trimester   2.  [redacted] weeks gestation of pregnancy   3. Low back pain during pregnancy in third trimester   4. NST (non-stress test) reactive    Discharge home Follow up as scheduled for prenatal appointments Return to MAU as needed Continue medication as prescribed   Follow-up Information    [facility] Follow up.   Specialty:  Obstetrics and Gynecology Why:  Follow up as scheduled for prenatal appointments  Contact information: [address] [place] [address] [phone]         Allergies as of 07/13/2018   No Known Allergies     Medication List  TAKE these medications   Accu-Chek FastClix Lancets Misc 1 Units by Percutaneous route 4 (four) times daily.   Blood Glucose Monitoring Suppl Devi Use as directed 4 times a day.   cephALEXin 250 MG/5ML suspension Commonly known as:  KEFLEX Take 10 mLs (500 mg total) by mouth at bedtime for 30 days.   cyclobenzaprine 10 MG tablet Commonly known as:  FLEXERIL Take 0.5-1 tablets (5-10 mg total) by mouth 3 (three) times daily as needed for muscle spasms.   glucose blood test strip Use as instructed   glucose blood test strip Commonly known as:  Accu-Chek Guide Use to check blood sugars four times a day was instructed     ASK your doctor about these medications   acyclovir 200 MG/5ML suspension Commonly known as:  Zovirax Take 10 mLs (400 mg total) by mouth 3 (three) times daily for 7 days. Ask about: Should I take this medication?      Sharyon Cable CNM 07/13/2018, 12:21 AM

## 2018-07-20 ENCOUNTER — Ambulatory Visit (INDEPENDENT_AMBULATORY_CARE_PROVIDER_SITE_OTHER): Payer: 59 | Admitting: Advanced Practice Midwife

## 2018-07-20 ENCOUNTER — Encounter: Payer: Self-pay | Admitting: Advanced Practice Midwife

## 2018-07-20 ENCOUNTER — Other Ambulatory Visit: Payer: Self-pay

## 2018-07-20 VITALS — BP 113/69 | HR 98 | Wt 258.0 lb

## 2018-07-20 DIAGNOSIS — A6000 Herpesviral infection of urogenital system, unspecified: Secondary | ICD-10-CM

## 2018-07-20 DIAGNOSIS — R102 Pelvic and perineal pain: Secondary | ICD-10-CM

## 2018-07-20 DIAGNOSIS — O26893 Other specified pregnancy related conditions, third trimester: Secondary | ICD-10-CM

## 2018-07-20 DIAGNOSIS — O24419 Gestational diabetes mellitus in pregnancy, unspecified control: Secondary | ICD-10-CM

## 2018-07-20 DIAGNOSIS — M25552 Pain in left hip: Secondary | ICD-10-CM

## 2018-07-20 DIAGNOSIS — O1203 Gestational edema, third trimester: Secondary | ICD-10-CM

## 2018-07-20 DIAGNOSIS — O98513 Other viral diseases complicating pregnancy, third trimester: Secondary | ICD-10-CM

## 2018-07-20 DIAGNOSIS — O0990 Supervision of high risk pregnancy, unspecified, unspecified trimester: Secondary | ICD-10-CM

## 2018-07-20 MED ORDER — ACYCLOVIR 200 MG/5ML PO SUSP: 400.0000 mg | Freq: Three times a day (TID) | ORAL | 1 refills | Status: AC

## 2018-07-20 NOTE — Progress Notes (Signed)
   PRENATAL VISIT NOTE  Subjective:  Holly Brown is a 33 y.o. [phone] at [redacted]w[redacted]d being seen today for ongoing prenatal care.  She is currently monitored for the following issues for this high-risk pregnancy and has Syncope; Heart palpitations; Family history of colon cancer; Gonorrhea affecting pregnancy in first trimester; Cervical high risk HPV (human papillomavirus) test positive; Supervision of high risk pregnancy, antepartum; History of preterm delivery, currently pregnant; and Urinary tract infection affecting care of mother in first trimester, antepartum on their problem list.  Patient reports pain in left hip, some swelling there yesterday, resolved today.  Contractions: Irritability. Vag. Bleeding: None.  Movement: Present. Denies leaking of fluid.   The following portions of the patient's history were reviewed and updated as appropriate: allergies, current medications, past family history, past medical history, past social history, past surgical history and problem list.   Objective:   Vitals:   07/20/18 1005  BP: 113/69  Pulse: 98  Weight: 117 kg    Fetal Status:     Movement: Present     General:  Alert, oriented and cooperative. Patient is in no acute distress.  Skin: Skin is warm and dry. No rash noted.   Cardiovascular: Normal heart rate noted  Respiratory: Normal respiratory effort, no problems with respiration noted  Abdomen: Soft, gravid, appropriate for gestational age.  Pain/Pressure: Present     Pelvic: Cervical exam deferred        Extremities: Normal range of motion.  Edema: Trace  Mental Status: Normal mood and affect. Normal behavior. Normal judgment and thought content.   Assessment and Plan:  Pregnancy: [id number] at [redacted]w[redacted]d 1. Supervision of high risk pregnancy, antepartum --Anticipatory guidance about next visits/weeks of pregnancy given. --Reviewed safety, visitor policy, reassurance about COVID-19 for pregnancy at this time. Discussed possible changes  to visits, including televisits, that may occur due to COVID-19.  The office remains open if pt needs to be seen and MAU is open 24 hours/day for OB emergencies.  --Pt to return to office in 2 weeks for high risk pregnancy  2. Primary genital herpes simplex infection --Pt completed treatment dose of acyclovir. --Begin suppressive therapy at 34 weeks (next week) with acyclovir liquid (pt preference) 400 mg TID until delivery.  3. Gestational diabetes mellitus (GDM), antepartum, gestational diabetes method of control unspecified --Reviewed glucose log.  Fasting CBG all below 90. PP CBG range all below 120. Glucose well controlled, remain on current therapy.   4. Left hip pain --Pain that radiates down her left leg, unchanged in recent weeks.  5. Pelvic pain affecting pregnancy in third trimester, antepartum --Pt pain is improved from earlier in the pregnancy but still has pain in her left hip sometimes radiates down left leg.   6. Edema during pregnancy, third trimester --Pt noticed an area of swelling that was hardened on her left hip yesterday but it resolved with movement, heat, and ice.   --Soft on exam, no changes to ROM.  No pain in calf or knee. Negative Homans, leg cool to touch, no differences in left or right BLE.  No evidence of DVT.  Preterm labor symptoms and general obstetric precautions including but not limited to vaginal bleeding, contractions, leaking of fluid and fetal movement were reviewed in detail with the patient. Please refer to After Visit Summary for other counseling recommendations.   No follow-ups on file.  No future appointments.  Sharen Counter, CNM

## 2018-07-20 NOTE — Patient Instructions (Signed)
Third Trimester of Pregnancy The third trimester is from week 28 through week 40 (months 7 through 9). The third trimester is a time when the unborn baby (fetus) is growing rapidly. At the end of the ninth month, the fetus is about 20 inches in length and weighs 6-10 pounds. Body changes during your third trimester Your body will continue to go through many changes during pregnancy. The changes vary from woman to woman. During the third trimester:  Your weight will continue to increase. You can expect to gain 25-35 pounds (11-16 kg) by the end of the pregnancy.  You may begin to get stretch marks on your hips, abdomen, and breasts.  You may urinate more often because the fetus is moving lower into your pelvis and pressing on your bladder.  You may develop or continue to have heartburn. This is caused by increased hormones that slow down muscles in the digestive tract.  You may develop or continue to have constipation because increased hormones slow digestion and cause the muscles that push waste through your intestines to relax.  You may develop hemorrhoids. These are swollen veins (varicose veins) in the rectum that can itch or be painful.  You may develop swollen, bulging veins (varicose veins) in your legs.  You may have increased body aches in the pelvis, back, or thighs. This is due to weight gain and increased hormones that are relaxing your joints.  You may have changes in your hair. These can include thickening of your hair, rapid growth, and changes in texture. Some women also have hair loss during or after pregnancy, or hair that feels dry or thin. Your hair will most likely return to normal after your baby is born.  Your breasts will continue to grow and they will continue to become tender. A yellow fluid (colostrum) may leak from your breasts. This is the first milk you are producing for your baby.  Your belly button may stick out.  You may notice more swelling in your hands,  face, or ankles.  You may have increased tingling or numbness in your hands, arms, and legs. The skin on your belly may also feel numb.  You may feel short of breath because of your expanding uterus.  You may have more problems sleeping. This can be caused by the size of your belly, increased need to urinate, and an increase in your body's metabolism.  You may notice the fetus "dropping," or moving lower in your abdomen (lightening).  You may have increased vaginal discharge.  You may notice your joints feel loose and you may have pain around your pelvic bone. What to expect at prenatal visits You will have prenatal exams every 2 weeks until week 36. Then you will have weekly prenatal exams. During a routine prenatal visit:  You will be weighed to make sure you and the baby are growing normally.  Your blood pressure will be taken.  Your abdomen will be measured to track your baby's growth.  The fetal heartbeat will be listened to.  Any test results from the previous visit will be discussed.  You may have a cervical check near your due date to see if your cervix has softened or thinned (effaced).  You will be tested for Group B streptococcus. This happens between 35 and 37 weeks. Your health care provider may ask you:  What your birth plan is.  How you are feeling.  If you are feeling the baby move.  If you have had any abnormal   symptoms, such as leaking fluid, bleeding, severe headaches, or abdominal cramping.  If you are using any tobacco products, including cigarettes, chewing tobacco, and electronic cigarettes.  If you have any questions. Other tests or screenings that may be performed during your third trimester include:  Blood tests that check for low iron levels (anemia).  Fetal testing to check the health, activity level, and growth of the fetus. Testing is done if you have certain medical conditions or if there are problems during the pregnancy.  Nonstress test  (NST). This test checks the health of your baby to make sure there are no signs of problems, such as the baby not getting enough oxygen. During this test, a belt is placed around your belly. The baby is made to move, and its heart rate is monitored during movement. What is false labor? False labor is a condition in which you feel small, irregular tightenings of the muscles in the womb (contractions) that usually go away with rest, changing position, or drinking water. These are called Braxton Hicks contractions. Contractions may last for hours, days, or even weeks before true labor sets in. If contractions come at regular intervals, become more frequent, increase in intensity, or become painful, you should see your health care provider. What are the signs of labor?  Abdominal cramps.  Regular contractions that start at 10 minutes apart and become stronger and more frequent with time.  Contractions that start on the top of the uterus and spread down to the lower abdomen and back.  Increased pelvic pressure and dull back pain.  A watery or bloody mucus discharge that comes from the vagina.  Leaking of amniotic fluid. This is also known as your "water breaking." It could be a slow trickle or a gush. Let your health care provider know if it has a color or strange odor. If you have any of these signs, call your health care provider right away, even if it is before your due date. Follow these instructions at home: Medicines  Follow your health care provider's instructions regarding medicine use. Specific medicines may be either safe or unsafe to take during pregnancy.  Take a prenatal vitamin that contains at least 600 micrograms (mcg) of folic acid.  If you develop constipation, try taking a stool softener if your health care provider approves. Eating and drinking   Eat a balanced diet that includes fresh fruits and vegetables, whole grains, good sources of protein such as meat, eggs, or tofu,  and low-fat dairy. Your health care provider will help you determine the amount of weight gain that is right for you.  Avoid raw meat and uncooked cheese. These carry germs that can cause birth defects in the baby.  If you have low calcium intake from food, talk to your health care provider about whether you should take a daily calcium supplement.  Eat four or five small meals rather than three large meals a day.  Limit foods that are high in fat and processed sugars, such as fried and sweet foods.  To prevent constipation: ? Drink enough fluid to keep your urine clear or pale yellow. ? Eat foods that are high in fiber, such as fresh fruits and vegetables, whole grains, and beans. Activity  Exercise only as directed by your health care provider. Most women can continue their usual exercise routine during pregnancy. Try to exercise for 30 minutes at least 5 days a week. Stop exercising if you experience uterine contractions.  Avoid heavy lifting.  Do   not exercise in extreme heat or humidity, or at high altitudes.  Wear low-heel, comfortable shoes.  Practice good posture.  You may continue to have sex unless your health care provider tells you otherwise. Relieving pain and discomfort  Take frequent breaks and rest with your legs elevated if you have leg cramps or low back pain.  Take warm sitz baths to soothe any pain or discomfort caused by hemorrhoids. Use hemorrhoid cream if your health care provider approves.  Wear a good support bra to prevent discomfort from breast tenderness.  If you develop varicose veins: ? Wear support pantyhose or compression stockings as told by your healthcare provider. ? Elevate your feet for 15 minutes, 3-4 times a day. Prenatal care  Write down your questions. Take them to your prenatal visits.  Keep all your prenatal visits as told by your health care provider. This is important. Safety  Wear your seat belt at all times when driving.  Make  a list of emergency phone numbers, including numbers for family, friends, the hospital, and police and fire departments. General instructions  Avoid cat litter boxes and soil used by cats. These carry germs that can cause birth defects in the baby. If you have a cat, ask someone to clean the litter box for you.  Do not travel far distances unless it is absolutely necessary and only with the approval of your health care provider.  Do not use hot tubs, steam rooms, or saunas.  Do not drink alcohol.  Do not use any products that contain nicotine or tobacco, such as cigarettes and e-cigarettes. If you need help quitting, ask your health care provider.  Do not use any medicinal herbs or unprescribed drugs. These chemicals affect the formation and growth of the baby.  Do not douche or use tampons or scented sanitary pads.  Do not cross your legs for long periods of time.  To prepare for the arrival of your baby: ? Take prenatal classes to understand, practice, and ask questions about labor and delivery. ? Make a trial run to the hospital. ? Visit the hospital and tour the maternity area. ? Arrange for maternity or paternity leave through employers. ? Arrange for family and friends to take care of pets while you are in the hospital. ? Purchase a rear-facing car seat and make sure you know how to install it in your car. ? Pack your hospital bag. ? Prepare the baby's nursery. Make sure to remove all pillows and stuffed animals from the baby's crib to prevent suffocation.  Visit your dentist if you have not gone during your pregnancy. Use a soft toothbrush to brush your teeth and be gentle when you floss. Contact a health care provider if:  You are unsure if you are in labor or if your water has broken.  You become dizzy.  You have mild pelvic cramps, pelvic pressure, or nagging pain in your abdominal area.  You have lower back pain.  You have persistent nausea, vomiting, or  diarrhea.  You have an unusual or bad smelling vaginal discharge.  You have pain when you urinate. Get help right away if:  Your water breaks before 37 weeks.  You have regular contractions less than 5 minutes apart before 37 weeks.  You have a fever.  You are leaking fluid from your vagina.  You have spotting or bleeding from your vagina.  You have severe abdominal pain or cramping.  You have rapid weight loss or weight gain.  You have   shortness of breath with chest pain.  You notice sudden or extreme swelling of your face, hands, ankles, feet, or legs.  Your baby makes fewer than 10 movements in 2 hours.  You have severe headaches that do not go away when you take medicine.  You have vision changes. Summary  The third trimester is from week 28 through week 40, months 7 through 9. The third trimester is a time when the unborn baby (fetus) is growing rapidly.  During the third trimester, your discomfort may increase as you and your baby continue to gain weight. You may have abdominal, leg, and back pain, sleeping problems, and an increased need to urinate.  During the third trimester your breasts will keep growing and they will continue to become tender. A yellow fluid (colostrum) may leak from your breasts. This is the first milk you are producing for your baby.  False labor is a condition in which you feel small, irregular tightenings of the muscles in the womb (contractions) that eventually go away. These are called Braxton Hicks contractions. Contractions may last for hours, days, or even weeks before true labor sets in.  Signs of labor can include: abdominal cramps; regular contractions that start at 10 minutes apart and become stronger and more frequent with time; watery or bloody mucus discharge that comes from the vagina; increased pelvic pressure and dull back pain; and leaking of amniotic fluid. This information is not intended to replace advice given to you by your  health care provider. Make sure you discuss any questions you have with your health care provider. Document Released: 04/01/2001 Document Revised: 05/13/2016 Document Reviewed: 05/13/2016 Elsevier Interactive Patient Education  2019 [employer]  

## 2018-07-26 ENCOUNTER — Encounter: Payer: Self-pay | Admitting: Advanced Practice Midwife

## 2018-07-29 ENCOUNTER — Encounter: Payer: Self-pay | Admitting: Advanced Practice Midwife

## 2018-08-03 ENCOUNTER — Ambulatory Visit (INDEPENDENT_AMBULATORY_CARE_PROVIDER_SITE_OTHER): Payer: 59 | Admitting: Advanced Practice Midwife

## 2018-08-03 ENCOUNTER — Other Ambulatory Visit: Payer: Self-pay

## 2018-08-03 DIAGNOSIS — O0990 Supervision of high risk pregnancy, unspecified, unspecified trimester: Secondary | ICD-10-CM

## 2018-08-03 DIAGNOSIS — O24415 Gestational diabetes mellitus in pregnancy, controlled by oral hypoglycemic drugs: Secondary | ICD-10-CM | POA: Insufficient documentation

## 2018-08-03 DIAGNOSIS — R102 Pelvic and perineal pain: Secondary | ICD-10-CM

## 2018-08-03 DIAGNOSIS — O1203 Gestational edema, third trimester: Secondary | ICD-10-CM

## 2018-08-03 DIAGNOSIS — Z3A35 35 weeks gestation of pregnancy: Secondary | ICD-10-CM

## 2018-08-03 DIAGNOSIS — O26893 Other specified pregnancy related conditions, third trimester: Secondary | ICD-10-CM

## 2018-08-03 MED ORDER — METFORMIN HCL 500 MG PO TABS: 500.0000 mg | ORAL_TABLET | Freq: Two times a day (BID) | ORAL | 2 refills | Status: DC

## 2018-08-03 NOTE — Progress Notes (Signed)
TELEHEALTH VIRTUAL OBSTETRICS VISIT ENCOUNTER NOTE  I connected with Holly Brown on 08/03/18 at  8:50 AM EDT by telephone at home and verified that I am speaking with the correct person using two identifiers.   I discussed the limitations, risks, security and privacy concerns of performing an evaluation and management service by telephone and the availability of in person appointments. I also discussed with the patient that there may be a patient responsible charge related to this service. The patient expressed understanding and agreed to proceed.  Subjective:  Holly KayDesiree E Lyon is a 33 y.o. [phone] at 4142w0d being followed for ongoing prenatal care.  She is currently monitored for the following issues for this high-risk pregnancy and has Syncope; Heart palpitations; Family history of colon cancer; Gonorrhea affecting pregnancy in first trimester; Cervical high risk HPV (human papillomavirus) test positive; Supervision of high risk pregnancy, antepartum; History of preterm delivery, currently pregnant; and Urinary tract infection affecting care of mother in first trimester, antepartum on their problem list.  Patient reports occasional contractions and edema. Reports fetal movement. Denies any contractions, bleeding or leaking of fluid.   The following portions of the patient's history were reviewed and updated as appropriate: allergies, current medications, past family history, past medical history, past social history, past surgical history and problem list.   Objective:   General:  Alert, oriented and cooperative.   Mental Status: Normal mood and affect perceived. Normal judgment and thought content.  Rest of physical exam deferred due to type of encounter  Assessment and Plan:  Pregnancy: [id number] at 5242w0d  1. Supervision of high risk pregnancy, antepartum --Anticipatory guidance about next visits/weeks of pregnancy given. --Reviewed safety, visitor policy, reassurance about  COVID-19 for pregnancy at this time. Discussed possible changes to visits, including televisits, that may occur due to COVID-19.  The office remains open if pt needs to be seen and MAU is open 24 hours/day for OB emergencies.   - Babyscripts Schedule Optimization --Pt has access to BP cuff and will start using today  2. Pelvic pain affecting pregnancy in third trimester, antepartum --Sharp occasional pain with fetal movement.  Discussed as normal pain of pregnancy, may be nerve pain.  Recommend hands and knees position to rotate baby off nerve.  PTL precautions reviewed.  3. Gestational diabetes mellitus (GDM), antepartum, gestational diabetes method of control unspecified --Pt not taking fasting glucose. PP all 130-135.   --Start Metformin 500 mg BID, pt to f/u in office in 1 week  4. Edema during pregnancy in third trimester --Intermittent, resolves when lying down, elevating feet --No s/sx of PEC, no hx HTN --Pt to take BP today and enter into Babyscripts, recommended she call office if elevated >140/90.  Preterm labor symptoms and general obstetric precautions including but not limited to vaginal bleeding, contractions, leaking of fluid and fetal movement were reviewed in detail with the patient.  I discussed the assessment and treatment plan with the patient. The patient was provided an opportunity to ask questions and all were answered. The patient agreed with the plan and demonstrated an understanding of the instructions. The patient was advised to call back or seek an in-person office evaluation/[facility] for any urgent or concerning symptoms. Please refer to After Visit Summary for other counseling recommendations.   I provided 15 minutes of non-face-to-face time during this encounter.  Return in about 1 week (around 08/10/2018).  No future appointments.  Sharen CounterLisa Leftwich-Kirby, CNM [facility],  [facility]

## 2018-08-03 NOTE — Progress Notes (Signed)
S/w patient via tele visit, pt reports fetal movement with irregular contractions. Pt does not have BP cuff currently with her, states that she can use her grandmother's cuff. Pt is unsure if she wants to use babyrx, advised that I would send the link. Pt states that she has not checked BG yet today.

## 2018-08-07 ENCOUNTER — Encounter (HOSPITAL_COMMUNITY): Payer: Self-pay

## 2018-08-07 ENCOUNTER — Inpatient Hospital Stay (HOSPITAL_COMMUNITY)
Admission: AD | Admit: 2018-08-07 | Discharge: 2018-08-07 | Disposition: A | Discharge status: Home / Self Care | Payer: 59 | Attending: Obstetrics and Gynecology | Admitting: Obstetrics and Gynecology

## 2018-08-07 ENCOUNTER — Other Ambulatory Visit: Payer: Self-pay

## 2018-08-07 DIAGNOSIS — Z87891 Personal history of nicotine dependence: Secondary | ICD-10-CM | POA: Diagnosis not present

## 2018-08-07 DIAGNOSIS — Z7984 Long term (current) use of oral hypoglycemic drugs: Secondary | ICD-10-CM | POA: Insufficient documentation

## 2018-08-07 DIAGNOSIS — B9689 Other specified bacterial agents as the cause of diseases classified elsewhere: Secondary | ICD-10-CM | POA: Insufficient documentation

## 2018-08-07 DIAGNOSIS — O2343 Unspecified infection of urinary tract in pregnancy, third trimester: Secondary | ICD-10-CM

## 2018-08-07 DIAGNOSIS — O23593 Infection of other part of genital tract in pregnancy, third trimester: Secondary | ICD-10-CM | POA: Insufficient documentation

## 2018-08-07 DIAGNOSIS — Z3A35 35 weeks gestation of pregnancy: Secondary | ICD-10-CM | POA: Diagnosis not present

## 2018-08-07 DIAGNOSIS — N760 Acute vaginitis: Secondary | ICD-10-CM

## 2018-08-07 DIAGNOSIS — O4703 False labor before 37 completed weeks of gestation, third trimester: Secondary | ICD-10-CM | POA: Insufficient documentation

## 2018-08-07 DIAGNOSIS — O4693 Antepartum hemorrhage, unspecified, third trimester: Secondary | ICD-10-CM | POA: Insufficient documentation

## 2018-08-07 LAB — URINALYSIS, ROUTINE W REFLEX MICROSCOPIC

## 2018-08-07 LAB — URINALYSIS, MICROSCOPIC (REFLEX): RBC / HPF: >50 RBC/hpf (ref 0–5)

## 2018-08-07 LAB — WET PREP, GENITAL
Sperm: NONE SEEN
Trich, Wet Prep: NONE SEEN

## 2018-08-07 MED ORDER — METRONIDAZOLE 0.75 % VA GEL: 1.0000 | Freq: Every day | VAGINAL | 0 refills | Status: DC

## 2018-08-07 MED ORDER — ACETAMINOPHEN 500 MG PO TABS
1000.0000 mg | ORAL_TABLET | Freq: Once | ORAL | Status: AC
  Administered 2018-08-07: 1000 mg via ORAL
  Filled 2018-08-07: qty 2

## 2018-08-07 MED ORDER — NIFEDIPINE 10 MG PO CAPS
10.0000 mg | ORAL_CAPSULE | ORAL | Status: DC | PRN
  Filled 2018-08-07: qty 1

## 2018-08-07 NOTE — MAU Provider Note (Signed)
History     CSN: [id number]  Arrival date and time: 08/07/18 1331   First Provider Initiated Contact with Patient 08/07/18 1401      Chief Complaint  Patient presents with  . Vaginal Bleeding   Holly Brown is a 33 y.o. [phone] at 3335w4d who presents for Vaginal Bleeding.  She reports she noted bleeding about 2 hours ago that has since lightened up.  However, patient states she passed a "quarter sized clot" and become concerned. She denies contractions, but endorses cramping and fetal movement.  Patient reports mucoid type discharge that is cream in color.  She denies vaginal odor, itching, or burning.  She denies sexual activity in the last 3 days.      OB History    Gravida  5   Para  3   Term  2   Preterm  1   AB  1   Living  3     SAB  1   TAB      Ectopic      Multiple      Live Births  3           Past Medical History:  Diagnosis Date  . Migraines   . Seizures (HCC)    Jan.2017    Past Surgical History:  Procedure Laterality Date  . DILATION AND CURETTAGE OF UTERUS    . WISDOM TOOTH EXTRACTION  2011    Family History  Problem Relation Age of Onset  . Colon cancer Father   . Hypertension Maternal Grandmother   . Diabetes Maternal Grandmother   . Hypertension Paternal Grandmother   . Diabetes Mother     Social History   Tobacco Use  . Smoking status: Former Smoker    Packs/day: 0.25    Types: Cigarettes    Start date: 12/21/2011  . Smokeless tobacco: Never Used  Substance Use Topics  . Alcohol use: Not Currently    Alcohol/week: 0.0 standard drinks    Comment: ocassionally   . Drug use: No    Allergies: No Known Allergies  Medications Prior to Admission  Medication Sig Dispense Refill Last Dose  . ACCU-CHEK FASTCLIX LANCETS MISC 1 Units by Percutaneous route 4 (four) times daily. 100 each 12 Taking  . acyclovir (ZOVIRAX) 200 MG/5ML suspension Take 10 mLs (400 mg total) by mouth 3 (three) times daily for 30 days. 900 mL 1  Taking  . Blood Glucose Monitoring Suppl DEVI Use as directed 4 times a day. 1 each 0 Taking  . cyclobenzaprine (FLEXERIL) 10 MG tablet Take 0.5-1 tablets (5-10 mg total) by mouth 3 (three) times daily as needed for muscle spasms. (Patient taking differently: Take 5-10 mg by mouth 3 (three) times daily as needed for muscle spasms. ) 20 tablet 0 Taking  . glucose blood (ACCU-CHEK GUIDE) test strip Use to check blood sugars four times a day was instructed 100 each 12 Taking  . glucose blood test strip Use as instructed 100 each 12 Taking  . metFORMIN (GLUCOPHAGE) 500 MG tablet Take 1 tablet (500 mg total) by mouth 2 (two) times daily with a meal. 60 tablet 2    Reports compliance: Acycolvir, Metformin, Keflex   Review of Systems  Constitutional: Negative for chills and fever.  Respiratory: Negative for cough and shortness of breath.   Gastrointestinal: Negative for constipation, diarrhea, nausea and vomiting.  Genitourinary: Positive for vaginal bleeding and vaginal discharge. Negative for dysuria.  Neurological: Negative for dizziness, light-headedness and headaches.  Fasting 104 After Breakfast 124 Took Metformin after breakfast Physical Exam   Blood pressure 124/67, pulse (!) 101, resp. rate 18, height 5\' 10"  (1.778 m), weight 120.3 kg, last menstrual period 11/20/2017, SpO2 98 %.  Physical Exam  Constitutional: She is oriented to person, place, and time. She appears well-developed and well-nourished. No distress.  HENT:  Head: Normocephalic and atraumatic.  Eyes: Conjunctivae are normal.  Neck: Normal range of motion.  Cardiovascular: Normal rate.  Respiratory: Effort normal.  GI: Soft.  Genitourinary: Cervix exhibits no motion tenderness, no discharge and no friability.    Vaginal discharge present.     No vaginal bleeding.  No bleeding in the vagina.    Genitourinary Comments: Speculum Exam: -Vaginal Vault: Pink mucosa.  Copious amt white frothy discharge in vault -wet prep  collected -Cervix:Pink with some inflammation.  Small friable area at 7 o'clock.  No apparent discharge or bleeding from os -Bimanual Exam: Dilation: 2.5 Effacement (%): 50 Cervical Position: Posterior Station: Ballotable Presentation: Vertex Exam by:: Sabas Sous CNM      Musculoskeletal: Normal range of motion.  Neurological: She is alert and oriented to person, place, and time.  Skin: Skin is warm and dry.  Psychiatric: She has a normal mood and affect. Her behavior is normal.    Fetal Assessment 135 bpm, Mod Var, -Decels, +Accels Toco: Occasional, palpates mild  MAU Course   Results for orders placed or performed during the hospital encounter of 08/07/18 (from the past 24 hour(s))  Urinalysis, Routine w reflex microscopic     Status: Abnormal   Collection Time: 08/07/18  2:19 PM  Result Value Ref Range   Color, Urine RED (A) YELLOW   APPearance TURBID (A) CLEAR   Specific Gravity, Urine  1.005 - 1.030    TEST NOT REPORTED DUE TO COLOR INTERFERENCE OF URINE PIGMENT   pH  5.0 - 8.0    TEST NOT REPORTED DUE TO COLOR INTERFERENCE OF URINE PIGMENT   Glucose, UA (A) NEGATIVE mg/dL    TEST NOT REPORTED DUE TO COLOR INTERFERENCE OF URINE PIGMENT   Hgb urine dipstick (A) NEGATIVE    TEST NOT REPORTED DUE TO COLOR INTERFERENCE OF URINE PIGMENT   Bilirubin Urine (A) NEGATIVE    TEST NOT REPORTED DUE TO COLOR INTERFERENCE OF URINE PIGMENT   Ketones, ur (A) NEGATIVE mg/dL    TEST NOT REPORTED DUE TO COLOR INTERFERENCE OF URINE PIGMENT   Protein, ur (A) NEGATIVE mg/dL    TEST NOT REPORTED DUE TO COLOR INTERFERENCE OF URINE PIGMENT   Nitrite (A) NEGATIVE    TEST NOT REPORTED DUE TO COLOR INTERFERENCE OF URINE PIGMENT   Leukocytes,Ua (A) NEGATIVE    TEST NOT REPORTED DUE TO COLOR INTERFERENCE OF URINE PIGMENT  Urinalysis, Microscopic (reflex)     Status: Abnormal   Collection Time: 08/07/18  2:19 PM  Result Value Ref Range   RBC / HPF >50 0 - 5 RBC/hpf   WBC, UA 6-10 0 - 5 WBC/hpf    Bacteria, UA MANY (A) NONE SEEN   Squamous Epithelial / LPF 0-5 0 - 5   Mucus PRESENT   Wet prep, genital     Status: Abnormal   Collection Time: 08/07/18  2:39 PM  Result Value Ref Range   Yeast Wet Prep HPF POC (A) NONE SEEN    Specimen diluted due to transport tube containing more than 1 ml of saline, interpret results with caution.   Trich, Wet Prep NONE SEEN NONE SEEN  Clue Cells Wet Prep HPF POC PRESENT (A) NONE SEEN   WBC, Wet Prep HPF POC MANY (A) NONE SEEN   Sperm NONE SEEN    No results found.  MDM PE Labs:UA, Wet Prep, GBS Oral Hydration [employer] Assessment and Plan  33 year old [id number] at 35.4wks Cat I FT Vaginal Bleeding  -Exam findings discussed -Wet prep and GBS collected. -Will await results -NST Reactive  Follow Up (3:26 PM) Contractions Bacterial Vaginosis  -Wet prep returns significant for clue cells -Results discussed with patient -Rx for Metrogel -Patient reporting pain and discomfort since VE, tracing and palpation confirms contractions -Patient offered and declines pain medication. -Will provide oral hydration.   Follow Up (4:14 PM)  -Patient reports continued perception of contractions. -Will start procardia protocol and reassess.  Follow Up (5:05 PM)  -Patient unable to swallow procardia capsule. -Cervical exam remains the same. -Will give tylenol for pain -Will discharge to home with precautions -Encouraged to call if any questions or concerns arise prior to next scheduled office visit.  -Discharged to home in stable condition  Cherre Robins MSN, CNM 08/07/2018, 2:01 PM

## 2018-08-07 NOTE — MAU Note (Signed)
Holly Brown is a 33 y.o. at [redacted]w[redacted]d here in MAU reporting: saw bleeding today around 1230 and a clot the size of a quarter. States she is still bleeding now- sees blood when she wipes. No pain, + FM. No recent IC, no vaginal discharge.  Onset of complaint: today  Pain score: 0/10  Vitals:   08/07/18 1341  BP: 124/67  Pulse: (!) 101  Resp: 18  SpO2: 98%     FHT: +FM  Lab orders placed from triage:UA, states she is unable to give sample at this time

## 2018-08-07 NOTE — Discharge Instructions (Signed)

## 2018-08-07 NOTE — MAU Note (Signed)
Pt unable to keep procardia capsule down.

## 2018-08-09 ENCOUNTER — Ambulatory Visit (INDEPENDENT_AMBULATORY_CARE_PROVIDER_SITE_OTHER): Payer: 59

## 2018-08-09 ENCOUNTER — Other Ambulatory Visit: Payer: Self-pay

## 2018-08-09 ENCOUNTER — Other Ambulatory Visit: Payer: Self-pay | Admitting: Obstetrics

## 2018-08-09 VITALS — BP 127/75 | HR 85 | Temp 98.6°F | Wt 267.0 lb

## 2018-08-09 DIAGNOSIS — O2343 Unspecified infection of urinary tract in pregnancy, third trimester: Secondary | ICD-10-CM | POA: Diagnosis not present

## 2018-08-09 LAB — CULTURE, BETA STREP (GROUP B ONLY)

## 2018-08-09 LAB — POCT URINALYSIS DIPSTICK
Glucose, UA: NEGATIVE
Nitrite, UA: POSITIVE
Protein, UA: POSITIVE — AB
Spec Grav, UA: 1.02 (ref 1.010–1.025)
Urobilinogen, UA: 2 E.U./dL — AB
pH, UA: 6 (ref 5.0–8.0)

## 2018-08-09 NOTE — Progress Notes (Signed)
SUBJECTIVE: Holly Brown is a 33 y.o. female who complains of urinary frequency, urgency and dysuria x 3 months, with back pain, denies fever, chills, or abnormal vaginal discharge or bleeding.   OBJECTIVE: Appears well, in no apparent distress.  Vital signs are normal. Urine dipstick shows positive for leukocytes, nitrate, blood, ketones, urobilin, bili.    ASSESSMENT: Dysuria, hematuria.  PLAN: urine culture sent to Lab. Treatment per orders.  Call or return to clinic prn if these symptoms worsen or fail to improve as anticipated.

## 2018-08-11 ENCOUNTER — Ambulatory Visit (INDEPENDENT_AMBULATORY_CARE_PROVIDER_SITE_OTHER): Payer: 59 | Admitting: Certified Nurse Midwife

## 2018-08-11 ENCOUNTER — Other Ambulatory Visit: Payer: Self-pay

## 2018-08-11 ENCOUNTER — Encounter: Payer: Self-pay | Admitting: Certified Nurse Midwife

## 2018-08-11 DIAGNOSIS — O2340 Unspecified infection of urinary tract in pregnancy, unspecified trimester: Secondary | ICD-10-CM

## 2018-08-11 DIAGNOSIS — O2343 Unspecified infection of urinary tract in pregnancy, third trimester: Secondary | ICD-10-CM

## 2018-08-11 DIAGNOSIS — O0990 Supervision of high risk pregnancy, unspecified, unspecified trimester: Secondary | ICD-10-CM

## 2018-08-11 DIAGNOSIS — B009 Herpesviral infection, unspecified: Secondary | ICD-10-CM

## 2018-08-11 DIAGNOSIS — Z3A36 36 weeks gestation of pregnancy: Secondary | ICD-10-CM

## 2018-08-11 DIAGNOSIS — A6000 Herpesviral infection of urogenital system, unspecified: Secondary | ICD-10-CM | POA: Insufficient documentation

## 2018-08-11 DIAGNOSIS — O24415 Gestational diabetes mellitus in pregnancy, controlled by oral hypoglycemic drugs: Secondary | ICD-10-CM

## 2018-08-11 NOTE — Progress Notes (Signed)
   TELEHEALTH [facility] OBSTETRICS VISIT ENCOUNTER NOTE  I connected with Holly Brown on 08/11/18 at  3:35 PM EDT by webex at home and verified that I am speaking with the correct person using two identifiers.   I discussed the limitations, risks, security and privacy concerns of performing an evaluation and management service by telephone and the availability of in person appointments. I also discussed with the patient that there may be a patient responsible charge related to this service. The patient expressed understanding and agreed to proceed.  Subjective:  Holly Brown is a 33 y.o. [phone] at [redacted]w[redacted]d being followed for ongoing prenatal care.  She is currently monitored for the following issues for this high-risk pregnancy and has Syncope; Heart palpitations; Family history of colon cancer; Gonorrhea affecting pregnancy in first trimester; Cervical high risk HPV (human papillomavirus) test positive; Supervision of high risk pregnancy, antepartum; History of preterm delivery, currently pregnant; Urinary tract infection affecting care of mother in first trimester, antepartum; and Gestational diabetes mellitus (GDM) in third trimester controlled on oral hypoglycemic drug on their problem list.  Patient reports no complaints. Reports fetal movement. Denies any contractions, bleeding or leaking of fluid.   The following portions of the patient's history were reviewed and updated as appropriate: allergies, current medications, past family history, past medical history, past social history, past surgical history and problem list.   Objective:   General:  Alert, oriented and cooperative.   Mental Status: Normal mood and affect perceived. Normal judgment and thought content.  Rest of physical exam deferred due to type of encounter  Assessment and Plan:  Pregnancy: [id number] at [redacted]w[redacted]d 1. Supervision of high risk pregnancy, antepartum - Patient doing well, no complaints - Anticipatory guidance on  upcoming appointments with [place] next week due to recent Rx for Metformin on 4/14.  - COVID 19 precautions   2. Gestational diabetes mellitus (GDM) in third trimester controlled on oral hypoglycemic drug - Fasting: range from 77-82 - 2hrPP: range from 104-120 with one elevated at 123 - Continue Metformin as prescribed  - [place] MFM OB FOLLOW UP; Future - [place] MFM FETAL BPP WO NON STRESS; Future  3. UTI (urinary tract infection) in pregnancy, antepartum - Was seen on Monday 4/20 with Dr Clearance Coots for recurrent UTI, prescribed Augmentin   4. HSV (herpes simplex virus) infection - Continue suppression until delivery   Preterm labor symptoms and general obstetric precautions including but not limited to vaginal bleeding, contractions, leaking of fluid and fetal movement were reviewed in detail with the patient.  I discussed the assessment and treatment plan with the patient. The patient was provided an opportunity to ask questions and all were answered. The patient agreed with the plan and demonstrated an understanding of the instructions. The patient was advised to call back or seek an in-person office evaluation/go to MAU at [facility] for any urgent or concerning symptoms. Please refer to After Visit Summary for other counseling recommendations.   I provided 10 minutes of non-face-to-face time during this encounter.  Return in about 1 week (around 08/18/2018) for ROB-WEBEX.  Future Appointments  Date Time Provider Department Center  08/16/2018  8:00 AM [facility] NURSE [facility]  08/16/2018  8:00 AM [facility] [place] 3 [facility]  08/17/2018  9:15 AM Leftwich-Kirby, Wilmer Floor, CNM [facility] None    Sharyon Cable, CNM Center for [employer], [facility]

## 2018-08-11 NOTE — Progress Notes (Signed)
Pt is on the phone preparing for Webex visit with provider. [redacted]w[redacted]d. Pt is checking BG levels at home: Fasting (77) After meals (highest 104). Pt is waiting for babyscripts BP cuff to come in the mail.

## 2018-08-12 ENCOUNTER — Encounter: Payer: Self-pay | Admitting: Advanced Practice Midwife

## 2018-08-12 LAB — CULTURE, OB URINE

## 2018-08-12 LAB — URINE CULTURE, OB REFLEX

## 2018-08-16 ENCOUNTER — Other Ambulatory Visit: Payer: Self-pay

## 2018-08-16 ENCOUNTER — Other Ambulatory Visit (HOSPITAL_COMMUNITY): Payer: Self-pay | Admitting: *Deleted

## 2018-08-16 ENCOUNTER — Encounter (HOSPITAL_COMMUNITY): Payer: Self-pay

## 2018-08-16 ENCOUNTER — Ambulatory Visit (HOSPITAL_COMMUNITY)
Admission: RE | Admit: 2018-08-16 | Discharge: 2018-08-16 | Disposition: A | Discharge status: Home / Self Care | Payer: 59 | Source: Ambulatory Visit | Attending: Obstetrics and Gynecology | Admitting: Obstetrics and Gynecology

## 2018-08-16 ENCOUNTER — Ambulatory Visit (HOSPITAL_COMMUNITY): Payer: 59 | Admitting: *Deleted

## 2018-08-16 VITALS — BP 118/70 | HR 87 | Temp 98.6°F

## 2018-08-16 DIAGNOSIS — O24415 Gestational diabetes mellitus in pregnancy, controlled by oral hypoglycemic drugs: Secondary | ICD-10-CM | POA: Insufficient documentation

## 2018-08-16 DIAGNOSIS — Z362 Encounter for other antenatal screening follow-up: Secondary | ICD-10-CM | POA: Diagnosis not present

## 2018-08-16 DIAGNOSIS — Z3A36 36 weeks gestation of pregnancy: Secondary | ICD-10-CM

## 2018-08-17 ENCOUNTER — Encounter (HOSPITAL_COMMUNITY): Payer: Self-pay | Admitting: *Deleted

## 2018-08-17 ENCOUNTER — Other Ambulatory Visit: Payer: Self-pay

## 2018-08-17 ENCOUNTER — Ambulatory Visit (INDEPENDENT_AMBULATORY_CARE_PROVIDER_SITE_OTHER): Payer: 59 | Admitting: Advanced Practice Midwife

## 2018-08-17 ENCOUNTER — Telehealth (HOSPITAL_COMMUNITY): Payer: Self-pay | Admitting: *Deleted

## 2018-08-17 VITALS — BP 123/78 | HR 97 | Wt 271.0 lb

## 2018-08-17 DIAGNOSIS — O0993 Supervision of high risk pregnancy, unspecified, third trimester: Secondary | ICD-10-CM

## 2018-08-17 DIAGNOSIS — R102 Pelvic and perineal pain: Secondary | ICD-10-CM

## 2018-08-17 DIAGNOSIS — O24415 Gestational diabetes mellitus in pregnancy, controlled by oral hypoglycemic drugs: Secondary | ICD-10-CM

## 2018-08-17 DIAGNOSIS — O0990 Supervision of high risk pregnancy, unspecified, unspecified trimester: Secondary | ICD-10-CM

## 2018-08-17 DIAGNOSIS — H539 Unspecified visual disturbance: Secondary | ICD-10-CM

## 2018-08-17 DIAGNOSIS — Z3A37 37 weeks gestation of pregnancy: Secondary | ICD-10-CM

## 2018-08-17 DIAGNOSIS — O26893 Other specified pregnancy related conditions, third trimester: Secondary | ICD-10-CM

## 2018-08-17 NOTE — Progress Notes (Signed)
I have reviewed the chart and agree with the nurse's note and plan of care for this visit.   Lisa Leftwich-Kirby, CNM 5:12 PM  

## 2018-08-17 NOTE — Progress Notes (Signed)
NOB had Webex visit this morning is here for BP Check.  BP     123/78 Pulse 97 Wgt    271lbs

## 2018-08-17 NOTE — Telephone Encounter (Signed)
Preadmission screen  

## 2018-08-17 NOTE — Addendum Note (Signed)
Addended by: Maretta Bees on: 08/17/2018 03:27 PM   Modules accepted: Orders

## 2018-08-17 NOTE — Progress Notes (Signed)
PRENATAL VISIT NOTE TELEHEALTH VIRTUAL OBSTETRICS VISIT ENCOUNTER NOTE  I connected with@ on 08/17/18 at  9:15 AM EDT by Webex at home and verified that I am speaking with the correct person using two identifiers.   I discussed the limitations, risks, security and privacy concerns of performing an evaluation and management service by telephone and the availability of in person appointments. I also discussed with the patient that there may be a patient responsible charge related to this service. The patient expressed understanding and agreed to proceed. Subjective:  Holly Brown is a 33 y.o. [phone] at [redacted]w[redacted]d being seen today for ongoing prenatal care.  She is currently monitored for the following issues for this high-risk pregnancy and has Syncope; Heart palpitations; Family history of colon cancer; Gonorrhea affecting pregnancy in first trimester; Cervical high risk HPV (human papillomavirus) test positive; Supervision of high risk pregnancy, antepartum; History of preterm delivery, currently pregnant; Urinary tract infection affecting care of mother in first trimester, antepartum; Gestational diabetes mellitus (GDM) in third trimester controlled on oral hypoglycemic drug; and HSV (herpes simplex virus) infection on their problem list.  Patient reports visual changes with blurred vision yesterday, sharp shooting vaginal pains throughout the day.  Reports fetal movement. Contractions: Irregular. Vag. Bleeding: None.  Movement: Present. Denies any contractions, bleeding or leaking of fluid.   The following portions of the patient's history were reviewed and updated as appropriate: allergies, current medications, past family history, past medical history, past social history, past surgical history and problem list.   Objective:  There were no vitals filed for this visit.  Fetal Status:     Movement: Present     General:  Alert, oriented and cooperative. Patient is in no acute distress.   Respiratory: Normal respiratory effort, no problems with respiration noted  Mental Status: Normal mood and affect. Normal behavior. Normal judgment and thought content.  Rest of physical exam deferred due to type of encounter  Assessment and Plan:  Pregnancy: [id number] at [redacted]w[redacted]d 1. Supervision of high risk pregnancy, antepartum --Anticipatory guidance about next visits/weeks of pregnancy given. --Reviewed safety, visitor policy, reassurance about COVID-19 for pregnancy at this time. Discussed possible changes to visits, including televisits, that may occur due to COVID-19.  The office remains open if pt needs to be seen and MAU is open 24 hours/day for OB emergencies.   --Pt has not accessed Babyscripts and does not have BP cuff. --Pt to office today for BP check r/t symptoms, see below.  2. Gestational diabetes mellitus (GDM) in third trimester controlled on oral hypoglycemic drug --Fasting all in 80s, PP highest 124. Continue current course with Metformin. Pt is only taking occasionally.  3. Vaginal pain --Sharp shooting pain is likely nerve pain and/or pain at pubic symphysis related to fetal position.  No regular cramping/contractions.  Good fetal movement.  4. Pelvic pain affecting pregnancy in third trimester, antepartum --See above. Recommended she wear support belt, rest/ice/heat/Tylenol.  5. Transient visual disturbance --Pt reports episode of blurred vision lasting 2 hours yesterday.  No hx HTN.  Pt is term and does not have BP cuff at home.  I recommend she come to the office today for BP check.  RN visit scheduled.  Term labor symptoms and general obstetric precautions including but not limited to vaginal bleeding, contractions, leaking of fluid and fetal movement were reviewed in detail with the patient. I discussed the assessment and treatment plan with the patient. The patient was provided an opportunity to ask questions and  all were answered. The patient agreed with the plan  and demonstrated an understanding of the instructions. The patient was advised to call back or seek an in-person office evaluation/[facility] for any urgent or concerning symptoms. Please refer to After Visit Summary for other counseling recommendations.  I provided 12 minutes of non-face-to-face time during this encounter. Return in about 1 week (around 08/24/2018).  Future Appointments  Date Time Provider Department Center  08/23/2018  9:00 AM [facility] NURSE [facility]  08/23/2018  9:00 AM [facility] US 3 [facility]-MFCUS M[facility]-US  08/23/2018  9:45 AM [facility] NST [facility]-US  08/26/2018  9:30 AM Sharyon Cableogers, Veronica C, CNM [facility] None  08/31/2018  7:00 AM MC-LD SCHED ROOM [facility]-INDC None    Sharen CounterLisa Leftwich-Kirby, CNM [facility], [facility]

## 2018-08-17 NOTE — Progress Notes (Signed)
Pt is on the phone preparing for Webex visit with provider. [redacted]w[redacted]d Pt is checking BG levels at home: Fasting (78) After meals (highest 124). Pt did not activate babyscripts, going to resend the email and pt instructed to activate so she can get kit in the mail.

## 2018-08-18 ENCOUNTER — Encounter: Payer: Self-pay | Admitting: Advanced Practice Midwife

## 2018-08-20 ENCOUNTER — Encounter (HOSPITAL_COMMUNITY): Payer: Self-pay

## 2018-08-20 ENCOUNTER — Other Ambulatory Visit: Payer: Self-pay

## 2018-08-20 ENCOUNTER — Inpatient Hospital Stay (HOSPITAL_COMMUNITY)
Admission: AD | Admit: 2018-08-20 | Discharge: 2018-08-20 | Disposition: A | Discharge status: Home / Self Care | Payer: 59 | Attending: Obstetrics & Gynecology | Admitting: Obstetrics & Gynecology

## 2018-08-20 DIAGNOSIS — O2343 Unspecified infection of urinary tract in pregnancy, third trimester: Secondary | ICD-10-CM | POA: Diagnosis not present

## 2018-08-20 DIAGNOSIS — N3000 Acute cystitis without hematuria: Secondary | ICD-10-CM

## 2018-08-20 DIAGNOSIS — Z833 Family history of diabetes mellitus: Secondary | ICD-10-CM | POA: Diagnosis not present

## 2018-08-20 DIAGNOSIS — Z800 Family history of malignant neoplasm of digestive organs: Secondary | ICD-10-CM | POA: Insufficient documentation

## 2018-08-20 DIAGNOSIS — Z87891 Personal history of nicotine dependence: Secondary | ICD-10-CM | POA: Insufficient documentation

## 2018-08-20 DIAGNOSIS — Z79899 Other long term (current) drug therapy: Secondary | ICD-10-CM | POA: Insufficient documentation

## 2018-08-20 DIAGNOSIS — O9989 Other specified diseases and conditions complicating pregnancy, childbirth and the puerperium: Secondary | ICD-10-CM | POA: Insufficient documentation

## 2018-08-20 DIAGNOSIS — Z3A37 37 weeks gestation of pregnancy: Secondary | ICD-10-CM | POA: Insufficient documentation

## 2018-08-20 DIAGNOSIS — O24415 Gestational diabetes mellitus in pregnancy, controlled by oral hypoglycemic drugs: Secondary | ICD-10-CM | POA: Diagnosis not present

## 2018-08-20 DIAGNOSIS — M5441 Lumbago with sciatica, right side: Secondary | ICD-10-CM

## 2018-08-20 DIAGNOSIS — M545 Low back pain: Secondary | ICD-10-CM | POA: Diagnosis not present

## 2018-08-20 DIAGNOSIS — G8929 Other chronic pain: Secondary | ICD-10-CM

## 2018-08-20 DIAGNOSIS — M5442 Lumbago with sciatica, left side: Secondary | ICD-10-CM

## 2018-08-20 LAB — URINALYSIS, ROUTINE W REFLEX MICROSCOPIC
Bilirubin Urine: NEGATIVE
Glucose, UA: >=500 mg/dL — AB
Hgb urine dipstick: NEGATIVE
Ketones, ur: NEGATIVE mg/dL
Nitrite: POSITIVE — AB
Protein, ur: 30 mg/dL — AB
Specific Gravity, Urine: 1.026 (ref 1.005–1.030)
pH: 5 (ref 5.0–8.0)

## 2018-08-20 MED ORDER — CYCLOBENZAPRINE HCL 10 MG PO TABS
5.0000 mg | ORAL_TABLET | Freq: Once | ORAL | Status: AC
  Administered 2018-08-20: 5 mg via ORAL
  Filled 2018-08-20: qty 1

## 2018-08-20 MED ORDER — CEPHALEXIN 500 MG PO CAPS: 500.0000 mg | ORAL_CAPSULE | Freq: Four times a day (QID) | ORAL | 2 refills | Status: DC

## 2018-08-20 MED ORDER — OXYCODONE-ACETAMINOPHEN 5-325 MG PO TABS: 1.0000 | ORAL_TABLET | Freq: Four times a day (QID) | ORAL | 0 refills | Status: DC | PRN

## 2018-08-20 NOTE — MAU Provider Note (Signed)
Chief Complaint:  Back Pain   First Provider Initiated Contact with Patient 08/20/18 0101      HPI: Holly Brown is a 33 y.o. [id number] at 1w3dwho presents to maternity admissions reporting low back pain.  Has a history of sciatica and uses Flexeril at home This time the pain is more intense.. She reports good fetal movement, denies LOF, vaginal bleeding, vaginal itching/burning, urinary symptoms, h/a, dizziness, n/v, diarrhea, constipation or fever/chills.   RN Note: Pt reports to MAU c/o back pain that is constant. Pt reports the pain is like a aching pain and she rates the pain at a 10/10. Pt reports she took tylenol at 1600 and it only helped a little bit. No bleeding or LOF. +FM.   Past Medical History: Past Medical History:  Diagnosis Date  . Gestational diabetes    metformin  . History of UTI   . Migraines   . Seizures (HCC)    Jan.2017  . Vaginal Pap smear, abnormal     Past obstetric history: OB History  Gravida Para Term Preterm AB Living  SAB TAB Ectopic Multiple Live Births  1       3    # Outcome Date GA Lbr Len/2nd Weight Sex Delivery Anes PTL Lv  5 Current           4 Term 11/08/12 [redacted]w[redacted]d  3532 g M Vag-Spont None  LIV     Birth Comments: No complications during pregnancy or delivery.  No special care nursery, went home with mother after 2 days.  3 Preterm 08/04/09 [redacted]w[redacted]d  3345 g M Vag-Spont  Y LIV  2 Term 10/02/08    F Vag-Spont   LIV  1 SAB             Past Surgical History: Past Surgical History:  Procedure Laterality Date  . DILATION AND CURETTAGE OF UTERUS    . WISDOM TOOTH EXTRACTION  2011    Family History: Family History  Problem Relation Age of Onset  . Colon cancer Father   . Hypertension Maternal Grandmother   . Diabetes Maternal Grandmother   . Hypertension Paternal Grandmother   . Diabetes Mother     Social History: Social History   Tobacco Use  . Smoking status: Former Smoker    Packs/day: 0.25    Types:  Cigarettes    Start date: 12/21/2011  . Smokeless tobacco: Never Used  Substance Use Topics  . Alcohol use: Not Currently    Alcohol/week: 0.0 standard drinks    Comment: ocassionally   . Drug use: No    Allergies: No Known Allergies  Meds:  Medications Prior to Admission  Medication Sig Dispense Refill Last Dose  . ACCU-CHEK FASTCLIX LANCETS MISC 1 Units by Percutaneous route 4 (four) times daily. 100 each 12 08/20/2018 at Unknown time  . amoxicillin-clavulanate (AUGMENTIN) 125-31.25 MG/5ML suspension Take by mouth 3 (three) times daily.   08/20/2018 at Unknown time  . Blood Glucose Monitoring Suppl DEVI Use as directed 4 times a day. 1 each 0 08/20/2018 at Unknown time  . glucose blood (ACCU-CHEK GUIDE) test strip Use to check blood sugars four times a day was instructed 100 each 12 08/20/2018 at Unknown time  . glucose blood test strip Use as instructed 100 each 12 08/20/2018 at Unknown time  . metFORMIN (GLUCOPHAGE) 500 MG tablet Take 1 tablet (500 mg total) by mouth 2 (two) times daily with a meal. 60 tablet 2  Past Week at Unknown time  . valACYclovir (VALTREX) 500 MG tablet Take 500 mg by mouth 2 (two) times daily.     . cyclobenzaprine (FLEXERIL) 10 MG tablet Take 0.5-1 tablets (5-10 mg total) by mouth 3 (three) times daily as needed for muscle spasms. (Patient taking differently: Take 5-10 mg by mouth 3 (three) times daily as needed for muscle spasms. ) 20 tablet 0 Taking  . metroNIDAZOLE (METROGEL VAGINAL) 0.75 % vaginal gel Place 1 Applicatorful vaginally at bedtime. Insert one applicator, at bedtime, for 5 nights. (Patient not taking: Reported on 08/16/2018) 70 g 0 Not Taking    I have reviewed patient's Past Medical Hx, Surgical Hx, Family Hx, Social Hx, medications and allergies.   ROS:  Review of Systems  Constitutional: Negative for chills and fever.  Genitourinary: Negative for vaginal bleeding and vaginal discharge.  Musculoskeletal: Positive for back pain. Negative for gait  problem.  Neurological: Negative for weakness and numbness.   Other systems negative  Physical Exam   Patient Vitals for the past 24 hrs:  BP Temp Temp src Pulse Resp Height Weight  08/20/18 0025 129/79 98.6 F (37 C) Oral (!) 107 17 - -  08/20/18 0023 - - - - - 5\' 11"  (1.803 m) 123.1 kg   Constitutional: Well-developed, well-nourished female in no acute distress.  Cardiovascular: normal rate and rhythm Respiratory: normal effort, clear to auscultation bilaterally GI: Abd soft, non-tender, gravid appropriate for gestational age.   No rebound or guarding. MS: Extremities nontender, no edema, normal ROM  Back is somewhat tender though not impressively so Neurologic: Alert and oriented x 4.  GU: Neg CVAT.  PELVIC EXAM:      FHT:  Baseline 145 , moderate variability, accelerations present, no decelerations Contractions:  Irregular     Labs: Results for orders placed or performed during the hospital encounter of 08/20/18 (from the past 24 hour(s))  Urinalysis, Routine w reflex microscopic     Status: Abnormal   Collection Time: 08/20/18 12:50 AM  Result Value Ref Range   Color, Urine AMBER (A) YELLOW   APPearance HAZY (A) CLEAR   Specific Gravity, Urine 1.026 1.005 - 1.030   pH 5.0 5.0 - 8.0   Glucose, UA >=500 (A) NEGATIVE mg/dL   Hgb urine dipstick NEGATIVE NEGATIVE   Bilirubin Urine NEGATIVE NEGATIVE   Ketones, ur NEGATIVE NEGATIVE mg/dL   Protein, ur 30 (A) NEGATIVE mg/dL   Nitrite POSITIVE (A) NEGATIVE   Leukocytes,Ua SMALL (A) NEGATIVE   RBC / HPF 0-5 0 - 5 RBC/hpf   WBC, UA 6-10 0 - 5 WBC/hpf   Bacteria, UA RARE (A) NONE SEEN   Squamous Epithelial / LPF 0-5 0 - 5   Mucus PRESENT     A/Positive/-- (12/09 1105)  Imaging:    MAU Course/MDM: I have ordered labs and reviewed results. UA is remarkable for signs of infection NST reviewed and is reactive, Category I   Treatments in MAU included Flexeril with some relief.    Assessment: Single intrauterine  pregnancy at 6657w3d Low back pain Urinary Tract infection  Plan: Discharge home Rx Keflex for UTI, already on Augmentin suppression Rx #6tab Percocet for pain until antibiotic kicks in Labor precautions and fetal kick counts Follow up in Office for prenatal visits and recheck  Encouraged to return here or to other Urgent Care/ED if she develops worsening of symptoms, increase in pain, fever, or other concerning symptoms.   Pt stable at time of discharge.  Wynelle BourgeoisMarie   CNM, MSN Certified Nurse-Midwife 08/20/2018 1:02 AM

## 2018-08-20 NOTE — Discharge Instructions (Signed)

## 2018-08-20 NOTE — MAU Note (Signed)
Pt reports to MAU c/o back pain that is constant. Pt reports the pain is like a aching pain and she rates the pain at a 10/10. Pt reports she took tylenol at 1600 and it only helped a little bit. No bleeding or LOF. +FM.

## 2018-08-23 ENCOUNTER — Encounter (HOSPITAL_COMMUNITY): Payer: Self-pay

## 2018-08-23 ENCOUNTER — Other Ambulatory Visit: Payer: Self-pay

## 2018-08-23 ENCOUNTER — Other Ambulatory Visit: Payer: Self-pay | Admitting: Advanced Practice Midwife

## 2018-08-23 ENCOUNTER — Encounter: Payer: Self-pay | Admitting: Advanced Practice Midwife

## 2018-08-23 ENCOUNTER — Ambulatory Visit (HOSPITAL_COMMUNITY)
Admission: RE | Admit: 2018-08-23 | Discharge: 2018-08-23 | Disposition: A | Discharge status: Home / Self Care | Payer: 59 | Source: Ambulatory Visit | Attending: Obstetrics and Gynecology | Admitting: Obstetrics and Gynecology

## 2018-08-23 ENCOUNTER — Ambulatory Visit (HOSPITAL_COMMUNITY): Payer: 59

## 2018-08-23 ENCOUNTER — Other Ambulatory Visit (HOSPITAL_COMMUNITY): Payer: Self-pay | Admitting: Maternal & Fetal Medicine

## 2018-08-23 ENCOUNTER — Ambulatory Visit (HOSPITAL_COMMUNITY): Payer: 59 | Admitting: *Deleted

## 2018-08-23 VITALS — BP 110/71 | HR 94 | Temp 98.2°F

## 2018-08-23 DIAGNOSIS — O99213 Obesity complicating pregnancy, third trimester: Secondary | ICD-10-CM | POA: Diagnosis not present

## 2018-08-23 DIAGNOSIS — O24410 Gestational diabetes mellitus in pregnancy, diet controlled: Secondary | ICD-10-CM | POA: Insufficient documentation

## 2018-08-23 DIAGNOSIS — Z3A37 37 weeks gestation of pregnancy: Secondary | ICD-10-CM

## 2018-08-23 DIAGNOSIS — O24415 Gestational diabetes mellitus in pregnancy, controlled by oral hypoglycemic drugs: Secondary | ICD-10-CM | POA: Insufficient documentation

## 2018-08-23 DIAGNOSIS — O09213 Supervision of pregnancy with history of pre-term labor, third trimester: Secondary | ICD-10-CM

## 2018-08-23 MED ORDER — ACYCLOVIR 200 MG/5ML PO SUSP: 400.0000 mg | Freq: Three times a day (TID) | ORAL | 0 refills | Status: AC

## 2018-08-24 ENCOUNTER — Telehealth (HOSPITAL_COMMUNITY): Payer: Self-pay | Admitting: *Deleted

## 2018-08-24 NOTE — Telephone Encounter (Signed)
covid call pt refuses  Instructed to call her provider

## 2018-08-26 ENCOUNTER — Encounter: Payer: Self-pay | Admitting: Certified Nurse Midwife

## 2018-08-26 ENCOUNTER — Other Ambulatory Visit: Payer: Self-pay

## 2018-08-26 ENCOUNTER — Ambulatory Visit (INDEPENDENT_AMBULATORY_CARE_PROVIDER_SITE_OTHER): Payer: 59 | Admitting: Certified Nurse Midwife

## 2018-08-26 VITALS — BP 123/82 | HR 93 | Wt 269.9 lb

## 2018-08-26 DIAGNOSIS — O24415 Gestational diabetes mellitus in pregnancy, controlled by oral hypoglycemic drugs: Secondary | ICD-10-CM

## 2018-08-26 DIAGNOSIS — O2341 Unspecified infection of urinary tract in pregnancy, first trimester: Secondary | ICD-10-CM

## 2018-08-26 DIAGNOSIS — O0990 Supervision of high risk pregnancy, unspecified, unspecified trimester: Secondary | ICD-10-CM

## 2018-08-26 DIAGNOSIS — O2343 Unspecified infection of urinary tract in pregnancy, third trimester: Secondary | ICD-10-CM

## 2018-08-26 DIAGNOSIS — Z3A38 38 weeks gestation of pregnancy: Secondary | ICD-10-CM

## 2018-08-26 NOTE — Patient Instructions (Signed)
Labor Induction    Labor induction is when steps are taken to cause a pregnant woman to begin the labor process. Most women go into labor on their own between 37 weeks and 42 weeks of pregnancy. When this does not happen or when there is a medical need for labor to begin, steps may be taken to induce labor. Labor induction causes a pregnant woman's uterus to contract. It also causes the cervix to soften (ripen), open (dilate), and thin out (efface). Usually, labor is not induced before 39 weeks of pregnancy unless there is a medical reason to do so. Your health care provider will determine if labor induction is needed.  Before inducing labor, your health care provider will consider a number of factors, including:  · Your medical condition and your baby's.  · How many weeks along you are in your pregnancy.  · How mature your baby's lungs are.  · The condition of your cervix.  · The position of your baby.  · The size of your birth canal.  What are some reasons for labor induction?  Labor may be induced if:  · Your health or your baby's health is at risk.  · Your pregnancy is overdue by 1 week or more.  · Your water breaks but labor does not start on its own.  · There is a low amount of amniotic fluid around your baby.  You may also choose (elect) to have labor induced at a certain time. Generally, elective labor induction is done no earlier than 39 weeks of pregnancy.  What methods are used for labor induction?  Methods used for labor induction include:  · Prostaglandin medicine. This medicine starts contractions and causes the cervix to dilate and ripen. It can be taken by mouth (orally) or by being inserted into the vagina (suppository).  · Inserting a small, thin tube (catheter) with a balloon into the vagina and then expanding the balloon with water to dilate the cervix.  · Stripping the membranes. In this method, your health care provider gently separates amniotic sac tissue from the cervix. This causes the  cervix to stretch, which in turn causes the release of a hormone called progesterone. The hormone causes the uterus to contract. This procedure is often done during an office visit, after which you will be sent home to wait for contractions to begin.  · Breaking the water. In this method, your health care provider uses a small instrument to make a small hole in the amniotic sac. This eventually causes the amniotic sac to break. Contractions should begin after a few hours.  · Medicine to trigger or strengthen contractions. This medicine is given through an IV that is inserted into a vein in your arm.  Except for membrane stripping, which can be done in a clinic, labor induction is done in the hospital so that you and your baby can be carefully monitored.  How long does it take for labor to be induced?  The length of time it takes to induce labor depends on how ready your body is for labor. Some inductions can take up to 2-3 days, while others may take less than a day. Induction may take longer if:  · You are induced early in your pregnancy.  · It is your first pregnancy.  · Your cervix is not ready.  What are some risks associated with labor induction?  Some risks associated with labor induction include:  · Changes in fetal heart rate, such as being too   high, too low, or irregular (erratic).  · Failed induction.  · Infection in the mother or the baby.  · Increased risk of having a cesarean delivery.  · Fetal death.  · Breaking off (abruption) of the placenta from the uterus (rare).  · Rupture of the uterus (very rare).  When induction is needed for medical reasons, the benefits of induction generally outweigh the risks.  What are some reasons for not inducing labor?  Labor induction should not be done if:  · Your baby does not tolerate contractions.  · You have had previous surgeries on your uterus, such as a myomectomy, removal of fibroids, or a vertical scar from a previous cesarean delivery.  · Your placenta lies  very low in your uterus and blocks the opening of the cervix (placenta previa).  · Your baby is not in a head-down position.  · The umbilical cord drops down into the birth canal in front of the baby.  · There are unusual circumstances, such as the baby being very early (premature).  · You have had more than 2 previous cesarean deliveries.  Summary  · Labor induction is when steps are taken to cause a pregnant woman to begin the labor process.  · Labor induction causes a pregnant woman's uterus to contract. It also causes the cervix to ripen, dilate, and efface.  · Labor is not induced before 39 weeks of pregnancy unless there is a medical reason to do so.  · When induction is needed for medical reasons, the benefits of induction generally outweigh the risks.  This information is not intended to replace advice given to you by your health care provider. Make sure you discuss any questions you have with your health care provider.  Document Released: 08/27/2006 Document Revised: 05/21/2016 Document Reviewed: 05/21/2016  Elsevier Interactive Patient Education © 2019 [employer]

## 2018-08-26 NOTE — Progress Notes (Signed)
PRENATAL VISIT NOTE  Subjective:  Holly Brown is a 33 y.o. [phone] at 4362w2d being seen today for ongoing prenatal care.  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  Patient reports no complaints.  Contractions: Irregular. Vag. Bleeding: None.  Movement: Present. Denies leaking of fluid.   The following portions of the patient's history were reviewed and updated as appropriate: allergies, current medications, past family history, past medical history, past social history, past surgical history and problem list.   Objective:   Vitals:   08/26/18 0942  BP: 123/82  Pulse: 93  Weight: 269 lb 14.4 oz (122.4 kg)    Fetal Status: Fetal Heart Rate (bpm): 140 Fundal Height: 35 cm Movement: Present     General:  Alert, oriented and cooperative. Patient is in no acute distress.  Skin: Skin is warm and dry. No rash noted.   Cardiovascular: Normal heart rate noted  Respiratory: Normal respiratory effort, no problems with respiration noted  Abdomen: Soft, gravid, appropriate for gestational age.  Pain/Pressure: Absent     Pelvic:  detailed vaginal examination to inspect for lesions performed   Extremities: Normal range of motion.  Edema: Trace  Mental Status: Normal mood and affect. Normal behavior. Normal judgment and thought content.   Assessment and Plan:  Pregnancy: [id number] at 3462w2d 1. Supervision of high risk pregnancy, antepartum - Patient doing well, reports no  complaints or concerns  - Discussed COVID19 screening that is scheduled for tomorrow, patient reports that she initially declined because she does not understand the reason if everyone else is not getting testing, "If I go into labor on my own or my water breaks, you would not test me".  - Educated on the purpose of the screening and that If it did come back positive and she stays asymptomatic then it does not mean baby will be taken from her, pediatrician and her will have a discussion and come up with plan, patient verbalizes understanding and plans to present tomorrow for screening.  - Anticipatory guidance on upcoming appointment with postpartum appointment, can be webex appointment due to receiving Depo prior to discharge home from hospital in midst of COVID19 and BTL that she plans being put on hold.   2. Urinary tract infection affecting care of mother in first trimester, antepartum - Continue medication as prescribed for recurrent UTI   3. Gestational diabetes mellitus (GDM) in third trimester controlled on oral hypoglycemic drug - Patient is noncompliant with medication, reports stopped taking Metformin because she does not like the way it makes her feel  - Reports "blood sugar levels are good", does not have records today in office to evaluate.  - Educated on fetal risks with uncontrolled GDM and noncompliance   4. HSV - MyChart message with Fleet ContrasLisa CNM on 4/29- patient reports big abscess on labia that is questioned to be another outbreak due to both labias being swollen on 5/2. No in person evaluation of labia or introitus. Patient suspected to secondary outbreak due to [employer] message.  - Patient is office today reports that it was not an outbreak .  She reports her vagina being irritated due to having diarrhea over the past days before. Denies any symptoms of outbreak today in office. Currently on suppression and is taking medication.  Misty Stanley consulted with Dr Jolayne Panther on 5/4 who recommends  evaluation and counseling at time of labor admission to determine mode of delivery  - Detailed evaluation and assessment of vaginal area, no lesions seen on examination today, encouraged to continue suppression. Discussed risks of vaginal delivery with current outbreak or recent outbreak and risks to fetus, patient verbalizes understanding.  - IOL scheduled for Tuesday, plan to reevaluate and time of induction, discuss risks again and determine mode of delivery with attending that day.  - Dr Earlene Plater consulted today in office today and agrees d/t not being able to confirm that it was an outbreak due to no picture or in person evaluation.   Term labor symptoms and general obstetric precautions including but not limited to vaginal bleeding, contractions, leaking of fluid and fetal movement were reviewed in detail with the patient. Please refer to After Visit Summary for other counseling recommendations.   Return in about 5 weeks (around 09/30/2018) for POSTPARTUM- webex.  Future Appointments  Date Time Provider Department Center  08/27/2018  8:10 AM [facility]U 1 [facility] None  08/31/2018  7:00 AM [facility]-LD SCHED ROOM [facility] None    Sharyon Cable, CNM

## 2018-08-26 NOTE — Progress Notes (Signed)
Pt is her for ROB. [redacted]w[redacted]d. Pt reports she has been checking her BG levels at home, she reports she is not taking the Metformin due to not liking "how she feels" when she takes it.

## 2018-08-27 ENCOUNTER — Other Ambulatory Visit: Payer: Self-pay

## 2018-08-27 ENCOUNTER — Other Ambulatory Visit (HOSPITAL_COMMUNITY)
Admission: RE | Admit: 2018-08-27 | Discharge: 2018-08-27 | Disposition: A | Discharge status: Home / Self Care | Payer: 59 | Source: Ambulatory Visit | Attending: Obstetrics & Gynecology | Admitting: Obstetrics & Gynecology

## 2018-08-27 DIAGNOSIS — Z1159 Encounter for screening for other viral diseases: Secondary | ICD-10-CM | POA: Diagnosis not present

## 2018-08-27 NOTE — MAU Note (Signed)
Asymptomatic.  Swabbed without problem.

## 2018-08-28 LAB — NOVEL CORONAVIRUS, NAA (HOSP ORDER, SEND-OUT TO REF LAB; TAT 18-24 HRS): SARS-CoV-2, NAA: NOT DETECTED

## 2018-08-30 ENCOUNTER — Other Ambulatory Visit (HOSPITAL_COMMUNITY): Payer: Self-pay | Admitting: *Deleted

## 2018-08-31 ENCOUNTER — Inpatient Hospital Stay (HOSPITAL_COMMUNITY)
Admission: AD | Admit: 2018-08-31 | Discharge: 2018-09-02 | DRG: 806 | Disposition: A | Discharge status: Home / Self Care | Payer: 59 | Attending: Obstetrics and Gynecology | Admitting: Obstetrics and Gynecology

## 2018-08-31 ENCOUNTER — Encounter (HOSPITAL_COMMUNITY): Payer: Self-pay

## 2018-08-31 ENCOUNTER — Other Ambulatory Visit: Payer: Self-pay

## 2018-08-31 ENCOUNTER — Inpatient Hospital Stay (HOSPITAL_COMMUNITY): Payer: 59

## 2018-08-31 DIAGNOSIS — Z3A39 39 weeks gestation of pregnancy: Secondary | ICD-10-CM | POA: Diagnosis not present

## 2018-08-31 DIAGNOSIS — Z87891 Personal history of nicotine dependence: Secondary | ICD-10-CM | POA: Diagnosis not present

## 2018-08-31 DIAGNOSIS — A6000 Herpesviral infection of urogenital system, unspecified: Secondary | ICD-10-CM | POA: Diagnosis present

## 2018-08-31 DIAGNOSIS — O2341 Unspecified infection of urinary tract in pregnancy, first trimester: Secondary | ICD-10-CM | POA: Diagnosis present

## 2018-08-31 DIAGNOSIS — O99824 Streptococcus B carrier state complicating childbirth: Secondary | ICD-10-CM | POA: Diagnosis present

## 2018-08-31 DIAGNOSIS — O9832 Other infections with a predominantly sexual mode of transmission complicating childbirth: Secondary | ICD-10-CM | POA: Diagnosis present

## 2018-08-31 DIAGNOSIS — O98211 Gonorrhea complicating pregnancy, first trimester: Secondary | ICD-10-CM | POA: Diagnosis present

## 2018-08-31 DIAGNOSIS — O24415 Gestational diabetes mellitus in pregnancy, controlled by oral hypoglycemic drugs: Secondary | ICD-10-CM | POA: Diagnosis present

## 2018-08-31 DIAGNOSIS — O24425 Gestational diabetes mellitus in childbirth, controlled by oral hypoglycemic drugs: Secondary | ICD-10-CM | POA: Diagnosis not present

## 2018-08-31 DIAGNOSIS — O0990 Supervision of high risk pregnancy, unspecified, unspecified trimester: Secondary | ICD-10-CM

## 2018-08-31 DIAGNOSIS — R87810 Cervical high risk human papillomavirus (HPV) DNA test positive: Secondary | ICD-10-CM | POA: Diagnosis present

## 2018-08-31 DIAGNOSIS — O09899 Supervision of other high risk pregnancies, unspecified trimester: Secondary | ICD-10-CM

## 2018-08-31 LAB — CBC
HCT: 34.1 % — ABNORMAL LOW (ref 36.0–46.0)
Hemoglobin: 11.1 g/dL — ABNORMAL LOW (ref 12.0–15.0)
MCH: 28.6 pg (ref 26.0–34.0)
MCHC: 32.6 g/dL (ref 30.0–36.0)
MCV: 87.9 fL (ref 80.0–100.0)
Platelets: 341 10*3/uL (ref 150–400)
RBC: 3.88 MIL/uL (ref 3.87–5.11)
RDW: 14.5 % (ref 11.5–15.5)
WBC: 11 10*3/uL — ABNORMAL HIGH (ref 4.0–10.5)
nRBC: 0 % (ref 0.0–0.2)

## 2018-08-31 LAB — TYPE AND SCREEN
ABO/RH(D): A POS
Antibody Screen: NEGATIVE

## 2018-08-31 LAB — RPR: RPR Ser Ql: NONREACTIVE

## 2018-08-31 LAB — GLUCOSE, CAPILLARY
Glucose-Capillary: 70 mg/dL (ref 70–99)
Glucose-Capillary: 87 mg/dL (ref 70–99)

## 2018-08-31 MED ORDER — DOCUSATE SODIUM 100 MG PO CAPS
100.0000 mg | ORAL_CAPSULE | Freq: Two times a day (BID) | ORAL | Status: DC
  Administered 2018-09-01: 100 mg via ORAL
  Filled 2018-08-31 (×3): qty 1

## 2018-08-31 MED ORDER — LACTATED RINGERS IV SOLN
500.0000 mL | INTRAVENOUS | Status: DC | PRN
  Administered 2018-08-31: 300 mL via INTRAVENOUS

## 2018-08-31 MED ORDER — FENTANYL CITRATE (PF) 100 MCG/2ML IJ SOLN
INTRAMUSCULAR | Status: AC
  Filled 2018-08-31: qty 2

## 2018-08-31 MED ORDER — OXYCODONE-ACETAMINOPHEN 5-325 MG PO TABS: 2.0000 | ORAL_TABLET | ORAL | Status: DC | PRN

## 2018-08-31 MED ORDER — WITCH HAZEL-GLYCERIN EX PADS: 1.0000 "application " | MEDICATED_PAD | CUTANEOUS | Status: DC | PRN

## 2018-08-31 MED ORDER — TERBUTALINE SULFATE 1 MG/ML IJ SOLN: 0.2500 mg | Freq: Once | INTRAMUSCULAR | Status: DC | PRN

## 2018-08-31 MED ORDER — ACETAMINOPHEN 325 MG PO TABS
650.0000 mg | ORAL_TABLET | ORAL | Status: DC | PRN
  Administered 2018-09-01 (×3): 650 mg via ORAL
  Filled 2018-08-31 (×3): qty 2

## 2018-08-31 MED ORDER — ONDANSETRON HCL 4 MG/2ML IJ SOLN: 4.0000 mg | INTRAMUSCULAR | Status: DC | PRN

## 2018-08-31 MED ORDER — MISOPROSTOL 50MCG HALF TABLET: 50.0000 ug | ORAL_TABLET | Freq: Once | ORAL | Status: DC

## 2018-08-31 MED ORDER — SIMETHICONE 80 MG PO CHEW: 80.0000 mg | CHEWABLE_TABLET | ORAL | Status: DC | PRN

## 2018-08-31 MED ORDER — OXYCODONE HCL 5 MG PO TABS: 5.0000 mg | ORAL_TABLET | ORAL | Status: DC | PRN

## 2018-08-31 MED ORDER — FENTANYL CITRATE (PF) 100 MCG/2ML IJ SOLN
100.0000 ug | INTRAMUSCULAR | Status: DC | PRN
  Administered 2018-08-31: 100 ug via INTRAVENOUS

## 2018-08-31 MED ORDER — DIPHENHYDRAMINE HCL 25 MG PO CAPS: 25.0000 mg | ORAL_CAPSULE | Freq: Four times a day (QID) | ORAL | Status: DC | PRN

## 2018-08-31 MED ORDER — LACTATED RINGERS IV SOLN
INTRAVENOUS | Status: DC
  Administered 2018-08-31 (×2): via INTRAVENOUS

## 2018-08-31 MED ORDER — COCONUT OIL OIL: 1.0000 "application " | TOPICAL_OIL | Status: DC | PRN

## 2018-08-31 MED ORDER — OXYCODONE HCL 5 MG PO TABS: 10.0000 mg | ORAL_TABLET | ORAL | Status: DC | PRN

## 2018-08-31 MED ORDER — OXYCODONE-ACETAMINOPHEN 5-325 MG PO TABS: 1.0000 | ORAL_TABLET | ORAL | Status: DC | PRN

## 2018-08-31 MED ORDER — IBUPROFEN 600 MG PO TABS
600.0000 mg | ORAL_TABLET | Freq: Four times a day (QID) | ORAL | Status: DC
  Administered 2018-08-31 – 2018-09-01 (×2): 600 mg via ORAL
  Filled 2018-08-31 (×4): qty 1

## 2018-08-31 MED ORDER — SOD CITRATE-CITRIC ACID 500-334 MG/5ML PO SOLN: 30.0000 mL | ORAL | Status: DC | PRN

## 2018-08-31 MED ORDER — OXYTOCIN BOLUS FROM INFUSION
500.0000 mL | Freq: Once | INTRAVENOUS | Status: AC
  Administered 2018-08-31: 500 mL via INTRAVENOUS

## 2018-08-31 MED ORDER — ONDANSETRON HCL 4 MG PO TABS: 4.0000 mg | ORAL_TABLET | ORAL | Status: DC | PRN

## 2018-08-31 MED ORDER — LIDOCAINE HCL (PF) 1 % IJ SOLN: 30.0000 mL | INTRAMUSCULAR | Status: DC | PRN

## 2018-08-31 MED ORDER — ACETAMINOPHEN 325 MG PO TABS: 650.0000 mg | ORAL_TABLET | ORAL | Status: DC | PRN

## 2018-08-31 MED ORDER — OXYTOCIN 40 UNITS IN NORMAL SALINE INFUSION - SIMPLE MED
1.0000 m[IU]/min | INTRAVENOUS | Status: DC
  Administered 2018-08-31: 2 m[IU]/min via INTRAVENOUS

## 2018-08-31 MED ORDER — BENZOCAINE-MENTHOL 20-0.5 % EX AERO
1.0000 "application " | INHALATION_SPRAY | CUTANEOUS | Status: DC | PRN
  Filled 2018-08-31: qty 56

## 2018-08-31 MED ORDER — ONDANSETRON HCL 4 MG/2ML IJ SOLN: 4.0000 mg | Freq: Four times a day (QID) | INTRAMUSCULAR | Status: DC | PRN

## 2018-08-31 MED ORDER — OXYTOCIN 40 UNITS IN NORMAL SALINE INFUSION - SIMPLE MED
2.5000 [IU]/h | INTRAVENOUS | Status: DC
  Administered 2018-08-31: 2.5 [IU]/h via INTRAVENOUS
  Filled 2018-08-31: qty 1000

## 2018-08-31 MED ORDER — SODIUM CHLORIDE 0.9 % IV SOLN
5.0000 10*6.[IU] | Freq: Once | INTRAVENOUS | Status: AC
  Administered 2018-08-31: 5 10*6.[IU] via INTRAVENOUS
  Filled 2018-08-31: qty 5

## 2018-08-31 MED ORDER — DIBUCAINE (PERIANAL) 1 % EX OINT: 1.0000 "application " | TOPICAL_OINTMENT | CUTANEOUS | Status: DC | PRN

## 2018-08-31 MED ORDER — PRENATAL MULTIVITAMIN CH
1.0000 | ORAL_TABLET | Freq: Every day | ORAL | Status: DC
  Filled 2018-08-31: qty 1

## 2018-08-31 MED ORDER — PENICILLIN G 3 MILLION UNITS IVPB - SIMPLE MED
3.0000 10*6.[IU] | INTRAVENOUS | Status: DC
  Administered 2018-08-31: 3 10*6.[IU] via INTRAVENOUS
  Filled 2018-08-31 (×7): qty 100

## 2018-08-31 NOTE — H&P (Signed)
Holly Brown is a 33 y.o. female [phone] @[redacted]w[redacted]d  presenting for IOL for GDM on metformin.  Pregnancy was complicated by recurrent UTI with daily abx for suppression and by primary outbreak of genital HSV at 29 weeks, treated with acyclovir. She also had gonorrhea with negative TOC.  She is currently on suppressive therapy for HSV with acyclovir. She had some symptoms she thought might be prodromal symptoms on 08/21/18, which she discussed with her provider in the office but no outbreak occurred and afterwards she reported it was only irritation from wiping with frequent BMs that day.  She had no visible lesions on exam in the office on 08/26/18 and presents without symptoms or lesions on admission today, 08/31/18.     Nursing Staff Provider  Office Location  [facility] Dating  US 10/2  Language  english Anatomy US  Wnl, incomplete repeat @ 23-24w  Flu Vaccine  02/01/18 Genetic Screen  NIPS: low risk female  AFP: normal    TDaP vaccine  06/22/2018 Hgb A1C or  GTT Early  Third trimester   Rhogam  n/a   LAB RESULTS   Feeding Plan breast Blood Type A/Positive/-- (12/09 1105)   Contraception BTL- depo prior to dc home from hospital d/t COVID19 Antibody Negative (12/09 1105)  Circumcision Yes if female Rubella 5.01 (12/09 1105)  Pediatrician  [facility] RPR Non Reactive (03/03 [id number])   Support Person FOB   HBsAg Negative (12/09 1105)   Prenatal Classes Unsure  HIV Non Reactive (03/03 [id number])  BTL Consent  GBS  positive (For PCN allergy, check sensitivities)   VBAC Consent  Pap     Hgb Electro      CF normal    SMA normal    Waterbirth  [ ]  Class [ ]  Consent [ ]  CNM visit     OB History    Gravida  5   Para  3   Term  2   Preterm  1   AB  1   Living  3     SAB  1   TAB      Ectopic      Multiple      Live Births  3          Past Medical History:  Diagnosis Date  . Gestational diabetes    metformin  . History of UTI   . Migraines   . Seizures (HCC)    Jan.2017  .  Vaginal Pap smear, abnormal    Past Surgical History:  Procedure Laterality Date  . DILATION AND CURETTAGE OF UTERUS    . WISDOM TOOTH EXTRACTION  2011   Family History: family history includes Colon cancer in her father; Diabetes in her maternal grandmother and mother; Hypertension in her maternal grandmother and paternal grandmother. Social History:  reports that she has quit smoking. Her smoking use included cigarettes. She started smoking about 6 years ago. She smoked 0.25 packs per day. She has never used smokeless tobacco. She reports previous alcohol use. She reports that she does not use drugs.     Maternal Diabetes: Yes:  Diabetes Type:  Insulin/Medication controlled Genetic Screening: Normal Maternal Ultrasounds/Referrals: Normal Fetal Ultrasounds or other Referrals:  None Maternal Substance Abuse:  No Significant Maternal Medications:  Meds include: Other:  Significant Maternal Lab Results:  Lab values include: Group B Strep positive Other Comments:  Pt taking Metformin  Review of Systems  Constitutional: Negative for chills and fever.  Respiratory: Negative for shortness of breath.  Cardiovascular: Negative for chest pain.  Gastrointestinal: Negative for abdominal pain, constipation, diarrhea and vomiting.  Neurological: Negative for dizziness and headaches.  All other systems reviewed and are negative.  Maternal Medical History:  Reason for admission: IOL for GDM on meds   Contractions: Frequency: irregular.   Perceived severity is mild.    Fetal activity: Perceived fetal activity is normal.   Last perceived fetal movement was within the past hour.    Prenatal complications: Infection.   Prenatal Complications - Diabetes: gestational. Diabetes is managed by oral agent (monotherapy).      Dilation: 3.5 Effacement (%): 50 Station: -2 Exam by:: Purvis Kilts, RN Blood pressure 120/71, pulse 94, temperature 98.4 F (36.9 C), temperature source Oral, resp.  rate 18, height 5\' 11"  (1.803 m), weight 76.2 kg, last menstrual period 11/20/2017. Maternal Exam:  Uterine Assessment: Contraction strength is mild.  Contraction frequency is irregular.   Abdomen: Fetal presentation: vertex  Pelvis: adequate for delivery.   Cervix: Cervix evaluated by digital exam.     Fetal Exam Fetal Monitor Review: Mode: ultrasound.   Baseline rate: 135.  Variability: moderate (6-25 bpm).   Pattern: accelerations present and no decelerations.    Fetal State Assessment: Category I - tracings are normal.     Physical Exam  Nursing note and vitals reviewed. Constitutional: She is oriented to person, place, and time. She appears well-developed and well-nourished.  Neck: Normal range of motion.  Cardiovascular: Normal rate, regular rhythm and normal heart sounds.  Respiratory: Effort normal and breath sounds normal.  GI: Soft.  Musculoskeletal: Normal range of motion.  Neurological: She is alert and oriented to person, place, and time.  Skin: Skin is warm and dry.  Psychiatric: She has a normal mood and affect. Her behavior is normal. Judgment and thought content normal.    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 Pt aware of risks of transmission to infant, low risk since primary outbreak earlier in pregnancy and she is taking acyclovir. Pt wants to proceed with IOL and vaginal delivery. Exam performed without any evidence of lesions.  Admit to Labor and Delivery PCN for GBS prophylaxis Cytotec for cervical ripening Anticipate NSVD   Sharen Counter 08/31/2018, 10:15 AM

## 2018-08-31 NOTE — Discharge Summary (Signed)
Postpartum Discharge Summary     Patient Name: Holly Brown DOB: 10/30/1985 MRN: [id number]  Date of admission: 08/31/2018 Delivering Provider: Federico FlakeNEWTON, KIMBERLY NILES   Date of discharge: 09/02/2018  Admitting diagnosis: pregnancy Intrauterine pregnancy: 3074w0d     Secondary diagnosis:  Principal Problem:   NSVD (normal spontaneous vaginal delivery) Active Problems:   Gonorrhea affecting pregnancy in first trimester   Cervical high risk HPV (human papillomavirus) test positive   Supervision of high risk pregnancy, antepartum   History of preterm delivery, currently pregnant   Urinary tract infection affecting care of mother in first trimester, antepartum   Gestational diabetes mellitus (GDM) in third trimester controlled on oral hypoglycemic drug   Genital HSV   Gestational diabetes mellitus (GDM) controlled on oral hypoglycemic drug  Additional problems: None      Discharge diagnosis: Term Pregnancy Delivered                                                                                                Post partum procedures:DepoProvera given   Augmentation: Pitocin  Complications: None  Hospital course:  Induction of Labor With Vaginal Delivery   33 y.o. yo [id number] at 5074w0d was admitted to the hospital 08/31/2018 for induction of labor.  Indication for induction: A2 DM.  Patient had an uncomplicated labor course as follows: Membrane Rupture Time/Date: 5:39 PM ,08/31/2018   Intrapartum Procedures: Episiotomy: None [1]                                         Lacerations:  1st degree [2]  Patient had delivery of a Viable infant.  Information for the patient's newborn:  Viann ShoveSanders, Girl Elvis [[id number]][[id number]]  Delivery Method: Vaginal, Spontaneous(Filed from Delivery Summary)   08/31/2018  Details of delivery can be found in separate delivery note.  Patient had a routine postpartum course. Patient is discharged home 09/02/18.  Magnesium Sulfate recieved: No BMZ received:  No  Physical exam  Vitals:   09/01/18 0445 09/01/18 1421 09/01/18 2221 09/02/18 0646  BP: 139/76 131/78 138/69 (!) 141/85  Pulse: (!) 103 93 92 (!) 108  Resp: 16 16 16 18   Temp: 98.1 F (36.7 C) 98.3 F (36.8 C) 98.5 F (36.9 C) 98.9 F (37.2 C)  TempSrc: Oral Oral  Oral  SpO2: 100% 100% 97%   Weight:      Height:       General: alert and cooperative Lochia: appropriate Uterine Fundus: firm Incision: N/A DVT Evaluation: No evidence of DVT seen on physical exam. Labs: Lab Results  Component Value Date   WBC 11.0 (H) 08/31/2018   HGB 11.1 (L) 08/31/2018   HCT 34.1 (L) 08/31/2018   MCV 87.9 08/31/2018   PLT 341 08/31/2018   CMP Latest Ref Rng & Units 03/04/2018  Glucose 70 - 99 mg/dL 130(Q124(H)  BUN 6 - 20 mg/dL 9  Creatinine 6.570.44 - 8.461.00 mg/dL 9.620.83  Sodium 952135 - 841145 mmol/L 136  Potassium 3.5 - 5.1 mmol/L 3.7  Chloride  98 - 111 mmol/L 104  CO2 22 - 32 mmol/L 21(L)  Calcium 8.9 - 10.3 mg/dL 9.3  Total Protein 6.5 - 8.1 g/dL 7.7  Total Bilirubin 0.3 - 1.2 mg/dL 0.6  Alkaline Phos 38 - 126 U/L 62  AST 15 - 41 U/L 23  ALT 0 - 44 U/L 28    Discharge instruction: per After Visit Summary and "Baby and Me Booklet".  After visit meds:  Allergies as of 09/02/2018   No Known Allergies     Medication List    STOP taking these medications   metFORMIN 500 MG tablet Commonly known as:  GLUCOPHAGE     TAKE these medications   Accu-Chek FastClix Lancets Misc 1 Units by Percutaneous route 4 (four) times daily.   Blood Glucose Monitoring Suppl Devi Use as directed 4 times a day.   cephALEXin 500 MG capsule Commonly known as:  KEFLEX Take 1 capsule (500 mg total) by mouth 4 (four) times daily.   cyclobenzaprine 10 MG tablet Commonly known as:  FLEXERIL Take 0.5-1 tablets (5-10 mg total) by mouth 3 (three) times daily as needed for muscle spasms.   docusate sodium 100 MG capsule Commonly known as:  COLACE Take 1 capsule (100 mg total) by mouth 2 (two) times daily.    glucose blood test strip Use as instructed   glucose blood test strip Commonly known as:  Accu-Chek Guide Use to check blood sugars four times a day was instructed   ibuprofen 800 MG tablet Commonly known as:  ADVIL Take 1 tablet (800 mg total) by mouth every 8 (eight) hours as needed.   metroNIDAZOLE 0.75 % vaginal gel Commonly known as:  METROGEL VAGINAL Place 1 Applicatorful vaginally at bedtime. Insert one applicator, at bedtime, for 5 nights.   oxyCODONE-acetaminophen 5-325 MG tablet Commonly known as:  PERCOCET/ROXICET Take 1 tablet by mouth every 6 (six) hours as needed.       Diet: carb modified diet  Activity: Advance as tolerated. Pelvic rest for 6 weeks.   Outpatient follow up:4 weeks Follow up Appt: Future Appointments  Date Time Provider Department Center  09/27/2018  9:00 AM Constant, Gigi Gin, MD [facility] None  10/12/2018  8:45 AM [facility] None   Follow up Visit:  Please schedule this patient for Postpartum visit in: 6 weeks with the following provider: Any provider For C/S patients schedule nurse incision check in weeks 2 weeks: na High risk pregnancy complicated by: GDM Delivery mode:  SVD Anticipated Birth Control:  Plans Interval BTL, Depo given prior to discharge  PP Procedures needed: 2 hour GTT  Schedule Integrated BH visit: no    Newborn Data: Live born female  Birth Weight: 6 lb 15.6 oz (3164 g) APGAR: 9, 9  Newborn Delivery   Birth date/time:  08/31/2018 17:39:00 Delivery type:  Vaginal, Spontaneous     Baby Feeding: Breast Disposition:home with mother   09/02/2018 De Hollingshead, DO

## 2018-08-31 NOTE — Progress Notes (Signed)
Holly Brown is a 33 y.o. [phone] at [redacted]w[redacted]d by ultrasound admitted for induction of labor due to Gestational diabetes.  Subjective: Pt comfortable, feeling contractions but pain is mild, tolerating well.    Objective: BP 123/71   Pulse 86   Temp (!) 97.4 F (36.3 C) (Oral)   Resp 18   Ht 5\' 11"  (1.803 m)   Wt 76.2 kg   LMP 11/20/2017 Comment: states it stopped and came back a couple times.  BMI 23.43 kg/m  No intake/output data recorded. No intake/output data recorded.  FHT:  FHR: 145 bpm, variability: moderate,  accelerations:  Present,  decelerations:  Absent UC:   regular, every 4-5 minutes, but difficult to assess with TOCO SVE:   5/50/-2, vertex  Labs: Lab Results  Component Value Date   WBC 11.0 (H) 08/31/2018   HGB 11.1 (L) 08/31/2018   HCT 34.1 (L) 08/31/2018   MCV 87.9 08/31/2018   PLT 341 08/31/2018    Assessment / Plan: Induction of labor due to gestational diabetes,  progressing well on pitocin  Labor: Progressing normally Preeclampsia:  n/a Fetal Wellbeing:  Category I Pain Control:  Labor support without medications I/D:  GBS pos on PCN Anticipated MOD:  NSVD  Sharen Counter 08/31/2018, 2:05 PM

## 2018-08-31 NOTE — Progress Notes (Signed)
Holly Brown is a 33 y.o. [phone] at [redacted]w[redacted]d by ultrasound admitted for induction of labor due to Gestational diabetes.  Subjective: Pt comfortable, feeling mild cramping. S/O at bedside for support.  Objective: BP 120/71   Pulse 94   Temp 98.4 F (36.9 C) (Oral)   Resp 18   Ht 5\' 11"  (1.803 m)   Wt 76.2 kg   LMP 11/20/2017 Comment: states it stopped and came back a couple times.  BMI 23.43 kg/m  No intake/output data recorded. No intake/output data recorded.  FHT:  FHR: 135 bpm, variability: moderate,  accelerations:  Present,  decelerations:  Absent UC:   irregular, every 5-15 minutes SVE:   Dilation: 3.5 Effacement (%): 50 Station: -2 Exam by:: Purvis Kilts, RN  Labs: Lab Results  Component Value Date   WBC 11.0 (H) 08/31/2018   HGB 11.1 (L) 08/31/2018   HCT 34.1 (L) 08/31/2018   MCV 87.9 08/31/2018   PLT 341 08/31/2018    Assessment / Plan: Induction of labor due to gestational diabetes GBS positive  Labor: Hold Cytotec and start Pitocin 2 by 2 with favorable cervix Preeclampsia:  n/a Fetal Wellbeing:  Category I Pain Control:  Labor support without medications I/D:  GBS positive Anticipated MOD:  NSVD  Sharen Counter 08/31/2018, 10:11 AM

## 2018-08-31 NOTE — Progress Notes (Addendum)
Holly Brown is a 33 y.o. [phone] at [redacted]w[redacted]d by ultrasound admitted for induction of labor due to Gestational diabetes.  Subjective: Pt breathing with contractions, coping well, does not desire pain medications.  Objective: BP 134/74   Pulse 79   Temp 98.4 F (36.9 C) (Oral)   Resp 20   Ht 5\' 11"  (1.803 m)   Wt 76.2 kg   LMP 11/20/2017 Comment: states it stopped and came back a couple times.  BMI 23.43 kg/m  No intake/output data recorded. No intake/output data recorded.  FHT:  FHR: 145 bpm, variability: moderate,  accelerations:  Present,  decelerations:  Present repetitive lates, resolved with position change UC:   regular, every 1.5-2 minutes SVE:   Dilation: 6 Effacement (%): 80 Station: -2 Exam by:: Leftwich-Kirby, CNM  Labs: Lab Results  Component Value Date   WBC 11.0 (H) 08/31/2018   HGB 11.1 (L) 08/31/2018   HCT 34.1 (L) 08/31/2018   MCV 87.9 08/31/2018   PLT 341 08/31/2018    Assessment / Plan: Induction of labor due to gestational diabetes,  progressing well on pitocin  Labor: Progressing normally . Pitocin reduced from 18 to 16 mu/min. Discussed AROM, pt declined at this time, will consider later. Preeclampsia:  n/a Fetal Wellbeing:  Category II Pain Control:  Labor support without medications I/D:  GBS pos Anticipated MOD:  NSVD  Sharen Counter 08/31/2018, 4:24 PM

## 2018-09-01 LAB — ABO/RH: ABO/RH(D): A POS

## 2018-09-01 NOTE — Progress Notes (Signed)
Post Partum Day 1TSVD Subjective: no complaints  Objective: Blood pressure 139/76, pulse (!) 103, temperature 98.1 F (36.7 C), temperature source Oral, resp. rate 16, height 5\' 11"  (1.803 m), weight 76.2 kg, last menstrual period 11/20/2017, SpO2 100 %, unknown if currently breastfeeding.  Physical Exam:  General: alert Lochia: appropriate Uterine Fundus: firm Incision: healing well DVT Evaluation: No evidence of DVT seen on physical exam.  Recent Labs    08/31/18 0803  HGB 11.1*  HCT 34.1*    Assessment/Plan: Plan for discharge tomorrow   LOS: 1 day   Hermina Staggers 09/01/2018, 8:31 AM

## 2018-09-01 NOTE — Lactation Note (Signed)
This note was copied from a baby's chart. Lactation Consultation Note Baby 12 hrs old. Baby sleeping. Mom stated to [facility] she BF her other children up to 8 months then gave bottles of BM up to 1 yr. Mom has asked RN for formula, yet mom is leaking.  Gave mom hand pump to give colostrum to baby. Encouraged mom if she is leaking not to use formula. Mom has everted nipples. Encouraged to pump when breast feeling heavy or leaking. Mom [employer] Employee, gave mom DEBP through [place], insurance papers copied. Reviewed newborn behavior, STS, I&O,milk storage, supply and demand. Mom states she is tired would like to sleep while baby is sleeping. Encouraged mom to call if has any questions or needs assistance. Lactation brochure given.  Patient Name: Holly Brown Today's Date: 09/01/2018 Reason for consult: Initial assessment   Maternal Data Has patient been taught Hand Expression?: Yes  Feeding    LATCH Score       Type of Nipple: Everted at rest and after stimulation  Comfort (Breast/Nipple): Filling, red/small blisters or bruises, mild/mod discomfort        Interventions Interventions: Breast feeding basics reviewed;Hand pump;Breast compression;Breast massage;Hand express  Lactation Tools Discussed/Used Tools: Pump WIC Program: Yes Pump Review: Setup, frequency, and cleaning;Milk Storage Initiated by:: Peri Jefferson RN IBCLC Date initiated:: 09/01/18   Consult Status Consult Status: Follow-up Date: 09/02/18 Follow-up type: In-patient    Charyl Dancer 09/01/2018, 5:39 AM

## 2018-09-02 DIAGNOSIS — Z3483 Encounter for supervision of other normal pregnancy, third trimester: Secondary | ICD-10-CM

## 2018-09-02 MED ORDER — MEDROXYPROGESTERONE ACETATE 150 MG/ML IM SUSP
150.0000 mg | Freq: Once | INTRAMUSCULAR | Status: AC
  Administered 2018-09-02: 150 mg via INTRAMUSCULAR
  Filled 2018-09-02: qty 1

## 2018-09-02 MED ORDER — IBUPROFEN 800 MG PO TABS: 800.0000 mg | ORAL_TABLET | Freq: Three times a day (TID) | ORAL | 1 refills | Status: DC | PRN

## 2018-09-02 MED ORDER — DOCUSATE SODIUM 100 MG PO CAPS: 100.0000 mg | ORAL_CAPSULE | Freq: Two times a day (BID) | ORAL | 0 refills | Status: DC

## 2018-09-02 NOTE — Lactation Note (Signed)
Lactation Consultation Note  Patient Name: Holly Brown [id number] Date: 09/02/2018   Visited mother who denies questions or concerns. Baby recently bf for 30 min. Feed on demand approximately 8-12 times per day.   Reviewed engorgement care and monitoring voids/stools. Mother had [employer] DEBP for employee.      Maternal Data    Feeding    LATCH Score                   Interventions    Lactation Tools Discussed/Used     Consult Status      Dahlia Byes [facility] 09/02/2018, 11:34 AM

## 2018-09-02 NOTE — Discharge Instructions (Signed)

## 2018-09-21 DIAGNOSIS — Z3483 Encounter for supervision of other normal pregnancy, third trimester: Secondary | ICD-10-CM

## 2018-09-27 ENCOUNTER — Encounter: Payer: Self-pay | Admitting: Obstetrics and Gynecology

## 2018-09-27 ENCOUNTER — Other Ambulatory Visit (HOSPITAL_COMMUNITY)
Admission: RE | Admit: 2018-09-27 | Discharge: 2018-09-27 | Disposition: A | Discharge status: Home / Self Care | Payer: 59 | Source: Ambulatory Visit | Attending: Obstetrics and Gynecology | Admitting: Obstetrics and Gynecology

## 2018-09-27 ENCOUNTER — Other Ambulatory Visit: Payer: Self-pay

## 2018-09-27 ENCOUNTER — Ambulatory Visit (INDEPENDENT_AMBULATORY_CARE_PROVIDER_SITE_OTHER): Payer: 59 | Admitting: Obstetrics and Gynecology

## 2018-09-27 DIAGNOSIS — Z1389 Encounter for screening for other disorder: Secondary | ICD-10-CM

## 2018-09-27 NOTE — Progress Notes (Signed)
Subjective:     Holly Brown is a 33 y.o. female who presents for a postpartum visit. She is 4 weeks postpartum following a spontaneous vaginal delivery. I have fully reviewed the prenatal and intrapartum course. The delivery was at 24 gestational weeks. Outcome: spontaneous vaginal delivery. Anesthesia: none. Postpartum course has been Unremarkable. Baby's course has been Unremarkable. Baby is feeding by breast. Bleeding staining only. Bowel function is normal. Bladder function is normal. Patient is not sexually active. Contraception method is abstinence and Depo-Provera injections. Postpartum depression screening: negative.     Review of Systems Pertinent items are noted in HPI.   Objective:    BP 103/68   Pulse 80   Ht 5\' 11"  (1.803 m)   Wt 238 lb 14.4 oz (108.4 kg)   Breastfeeding Yes   BMI 33.32 kg/m   General:  alert, cooperative and no distress   Breasts:  inspection negative, no nipple discharge or bleeding, no masses or nodularity palpable  Lungs: clear to auscultation bilaterally  Heart:  regular rate and rhythm  Abdomen: soft, non-tender; bowel sounds normal; no masses,  no organomegaly   Vulva:  normal  Vagina: normal vagina, no discharge, exudate, lesion, or erythema  Cervix:  multiparous appearance  Corpus: normal size, contour, position, consistency, mobility, non-tender  Adnexa:  normal adnexa and no mass, fullness, tenderness  Rectal Exam: Not performed.        Assessment:     Normal postpartum exam. Pap smear done at today's visit.   Plan:    1. Contraception: Depo-Provera injections pending [place] BTL 2. Patient is medically cleared to resume all activities of daily living 3. Follow up in: 2 weeks for glucola or as needed.

## 2018-09-28 LAB — CYTOLOGY - PAP: Diagnosis: NEGATIVE

## 2018-10-12 ENCOUNTER — Other Ambulatory Visit (INDEPENDENT_AMBULATORY_CARE_PROVIDER_SITE_OTHER): Payer: 59

## 2018-10-12 ENCOUNTER — Other Ambulatory Visit: Payer: Self-pay

## 2018-10-12 DIAGNOSIS — O0990 Supervision of high risk pregnancy, unspecified, unspecified trimester: Secondary | ICD-10-CM | POA: Diagnosis not present

## 2018-10-13 LAB — GLUCOSE TOLERANCE, 2 HOURS W/ 1HR
Glucose, 1 hour: 149 mg/dL (ref 65–179)
Glucose, 2 hour: 136 mg/dL (ref 65–152)
Glucose, Fasting: 86 mg/dL (ref 65–91)

## 2018-10-13 LAB — RPR: RPR Ser Ql: NONREACTIVE

## 2018-10-13 LAB — HIV ANTIBODY (ROUTINE TESTING W REFLEX): HIV Screen 4th Generation wRfx: NONREACTIVE

## 2018-10-13 LAB — CBC
Hematocrit: 38 % (ref 34.0–46.6)
Hemoglobin: 12.5 g/dL (ref 11.1–15.9)
MCH: 28 pg (ref 26.6–33.0)
MCHC: 32.9 g/dL (ref 31.5–35.7)
MCV: 85 fL (ref 79–97)
Platelets: 372 10*3/uL (ref 150–450)
RBC: 4.46 x10E6/uL (ref 3.77–5.28)
RDW: 14 % (ref 11.7–15.4)
WBC: 7.3 10*3/uL (ref 3.4–10.8)

## 2018-11-10 ENCOUNTER — Telehealth: Payer: Self-pay

## 2018-11-10 NOTE — Telephone Encounter (Signed)
Returned call about recovery for BTL

## 2018-11-17 ENCOUNTER — Other Ambulatory Visit: Payer: Self-pay | Admitting: Obstetrics and Gynecology

## 2018-11-30 ENCOUNTER — Other Ambulatory Visit: Payer: Self-pay

## 2018-11-30 ENCOUNTER — Encounter (HOSPITAL_BASED_OUTPATIENT_CLINIC_OR_DEPARTMENT_OTHER): Payer: Self-pay | Admitting: *Deleted

## 2018-12-04 ENCOUNTER — Other Ambulatory Visit (HOSPITAL_COMMUNITY)
Admission: RE | Admit: 2018-12-04 | Discharge: 2018-12-04 | Disposition: A | Discharge status: Home / Self Care | Payer: 59 | Source: Ambulatory Visit | Attending: Obstetrics and Gynecology | Admitting: Obstetrics and Gynecology

## 2018-12-04 DIAGNOSIS — Z01812 Encounter for preprocedural laboratory examination: Secondary | ICD-10-CM | POA: Insufficient documentation

## 2018-12-04 DIAGNOSIS — Z20828 Contact with and (suspected) exposure to other viral communicable diseases: Secondary | ICD-10-CM | POA: Diagnosis not present

## 2018-12-04 LAB — SARS CORONAVIRUS 2 (TAT 6-24 HRS): SARS Coronavirus 2: NEGATIVE

## 2018-12-06 DIAGNOSIS — Z3482 Encounter for supervision of other normal pregnancy, second trimester: Secondary | ICD-10-CM

## 2018-12-07 NOTE — H&P (Signed)
Holly Brown is an 33 y.o. female [phone] s/p vaginal delivery 08/2018 with undesired fertility here for scheduled permanent sterilization. Patient is doing well without complaints. She is using depo-provera for contraception.   Pertinent Gynecological History: Menses: amenorrhea due to depo-provera Contraception: Depo-Provera injections DES exposure: denies Blood transfusions: none Last pap: normal Date: 09/2018 OB History: G5, P3114   Menstrual History: Patient's last menstrual period was 12/05/2018.    Past Medical History:  Diagnosis Date  . Gestational diabetes    metformin  . History of UTI   . Migraines   . Seizures ([place])    Jan.2017  . Vaginal Pap smear, abnormal     Past Surgical History:  Procedure Laterality Date  . DILATION AND CURETTAGE OF UTERUS    . WISDOM TOOTH EXTRACTION  2011    Family History  Problem Relation Age of Onset  . Colon cancer Father   . Hypertension Maternal Grandmother   . Diabetes Maternal Grandmother   . Hypertension Paternal Grandmother   . Diabetes Mother     Social History:  reports that she has quit smoking. Her smoking use included cigarettes. She started smoking about 6 years ago. She smoked 0.25 packs per day. She has never used smokeless tobacco. She reports previous alcohol use. She reports that she does not use drugs.  Allergies: No Known Allergies  Medications Prior to Admission  Medication Sig Dispense Refill Last Dose  . cyclobenzaprine (FLEXERIL) 10 MG tablet Take 0.5-1 tablets (5-10 mg total) by mouth 3 (three) times daily as needed for muscle spasms. 20 tablet 0 Past Month at Unknown time    ROS See pertinent in HPI Blood pressure 125/61, pulse 80, temperature 98.4 F (36.9 C), temperature source Oral, resp. rate 20, height 5\' 11"  (1.803 m), weight 114.1 kg, last menstrual period 12/05/2018, SpO2 100 %, not currently breastfeeding. Physical Exam GENERAL: Well-developed, well-nourished female in no acute  distress.  LUNGS: Clear to auscultation bilaterally.  HEART: Regular rate and rhythm. ABDOMEN: Soft, nontender, nondistended. No organomegaly. PELVIC: Deferred to OR. EXTREMITIES: No cyanosis, clubbing, or edema, 2+ distal pulses. Results for orders placed or performed during the hospital encounter of 12/08/18 (from the past 24 hour(s))  Pregnancy, urine POC     Status: None   Collection Time: 12/08/18 11:17 AM  Result Value Ref Range   Preg Test, Ur NEGATIVE NEGATIVE    No results found.  Assessment/Plan: 33 y.o. [id number]  with undesired fertility who desires permanent sterilization. Other reversible forms of contraception were discussed with patient; she declines all other modalities.  Risks of procedure discussed with patient including permanence of method, bleeding, infection, injury to surrounding organs and need for additional procedures including laparotomy, risk of regret.  Failure risk of 0.5-1% with increased risk of ectopic gestation if pregnancy occurs was also discussed with patient.        12/08/2018, 12:03 PM

## 2018-12-08 ENCOUNTER — Ambulatory Visit (HOSPITAL_BASED_OUTPATIENT_CLINIC_OR_DEPARTMENT_OTHER)
Admission: RE | Admit: 2018-12-08 | Discharge: 2018-12-08 | Disposition: A | Discharge status: Home / Self Care | Payer: 59 | Attending: Obstetrics and Gynecology | Admitting: Obstetrics and Gynecology

## 2018-12-08 ENCOUNTER — Ambulatory Visit (HOSPITAL_BASED_OUTPATIENT_CLINIC_OR_DEPARTMENT_OTHER): Payer: 59 | Admitting: Anesthesiology

## 2018-12-08 ENCOUNTER — Encounter (HOSPITAL_BASED_OUTPATIENT_CLINIC_OR_DEPARTMENT_OTHER): Payer: Self-pay | Admitting: Anesthesiology

## 2018-12-08 ENCOUNTER — Other Ambulatory Visit: Payer: Self-pay

## 2018-12-08 ENCOUNTER — Encounter (HOSPITAL_BASED_OUTPATIENT_CLINIC_OR_DEPARTMENT_OTHER)
Admission: RE | Disposition: A | Discharge status: Home / Self Care | Payer: Self-pay | Source: Home / Self Care | Attending: Obstetrics and Gynecology

## 2018-12-08 DIAGNOSIS — Z302 Encounter for sterilization: Secondary | ICD-10-CM | POA: Insufficient documentation

## 2018-12-08 DIAGNOSIS — Z87891 Personal history of nicotine dependence: Secondary | ICD-10-CM | POA: Insufficient documentation

## 2018-12-08 DIAGNOSIS — E119 Type 2 diabetes mellitus without complications: Secondary | ICD-10-CM | POA: Insufficient documentation

## 2018-12-08 HISTORY — PX: LAPAROSCOPIC TUBAL LIGATION: SHX1937

## 2018-12-08 LAB — POCT PREGNANCY, URINE: Preg Test, Ur: NEGATIVE

## 2018-12-08 SURGERY — LIGATION, FALLOPIAN TUBE, LAPAROSCOPIC
Anesthesia: General | Site: Abdomen | Laterality: Bilateral

## 2018-12-08 MED ORDER — ACETAMINOPHEN 325 MG PO TABS: 325.0000 mg | ORAL_TABLET | ORAL | Status: DC | PRN

## 2018-12-08 MED ORDER — OXYCODONE HCL 5 MG PO TABS
ORAL_TABLET | ORAL | Status: AC
  Filled 2018-12-08: qty 1

## 2018-12-08 MED ORDER — SUGAMMADEX SODIUM 200 MG/2ML IV SOLN
INTRAVENOUS | Status: DC | PRN
  Administered 2018-12-08: 225 mg via INTRAVENOUS

## 2018-12-08 MED ORDER — FENTANYL CITRATE (PF) 100 MCG/2ML IJ SOLN
INTRAMUSCULAR | Status: AC
  Filled 2018-12-08: qty 2

## 2018-12-08 MED ORDER — ONDANSETRON HCL 4 MG/2ML IJ SOLN
INTRAMUSCULAR | Status: DC | PRN
  Administered 2018-12-08: 4 mg via INTRAVENOUS

## 2018-12-08 MED ORDER — PROPOFOL 10 MG/ML IV BOLUS
INTRAVENOUS | Status: DC | PRN
  Administered 2018-12-08: 200 mg via INTRAVENOUS

## 2018-12-08 MED ORDER — FENTANYL CITRATE (PF) 100 MCG/2ML IJ SOLN
25.0000 ug | INTRAMUSCULAR | Status: DC | PRN
  Administered 2018-12-08 (×2): 50 ug via INTRAVENOUS

## 2018-12-08 MED ORDER — ROCURONIUM BROMIDE 100 MG/10ML IV SOLN
INTRAVENOUS | Status: DC | PRN
  Administered 2018-12-08: 40 mg via INTRAVENOUS

## 2018-12-08 MED ORDER — MIDAZOLAM HCL 2 MG/2ML IJ SOLN
INTRAMUSCULAR | Status: AC
  Filled 2018-12-08: qty 2

## 2018-12-08 MED ORDER — ONDANSETRON HCL 4 MG/2ML IJ SOLN: 4.0000 mg | Freq: Once | INTRAMUSCULAR | Status: DC | PRN

## 2018-12-08 MED ORDER — OXYCODONE-ACETAMINOPHEN 5-325 MG PO TABS: 1.0000 | ORAL_TABLET | Freq: Four times a day (QID) | ORAL | 0 refills | Status: DC | PRN

## 2018-12-08 MED ORDER — DEXAMETHASONE SODIUM PHOSPHATE 4 MG/ML IJ SOLN
INTRAMUSCULAR | Status: DC | PRN
  Administered 2018-12-08: 10 mg via INTRAVENOUS

## 2018-12-08 MED ORDER — LACTATED RINGERS IV SOLN
INTRAVENOUS | Status: DC
  Administered 2018-12-08: 12:00:00 via INTRAVENOUS

## 2018-12-08 MED ORDER — ACETAMINOPHEN 160 MG/5ML PO SOLN: 325.0000 mg | ORAL | Status: DC | PRN

## 2018-12-08 MED ORDER — LIDOCAINE HCL (CARDIAC) PF 100 MG/5ML IV SOSY
PREFILLED_SYRINGE | INTRAVENOUS | Status: DC | PRN
  Administered 2018-12-08: 100 mg via INTRAVENOUS

## 2018-12-08 MED ORDER — BUPIVACAINE HCL (PF) 0.25 % IJ SOLN
INTRAMUSCULAR | Status: DC | PRN
  Administered 2018-12-08: 10 mL

## 2018-12-08 MED ORDER — OXYCODONE HCL 5 MG PO TABS
5.0000 mg | ORAL_TABLET | Freq: Once | ORAL | Status: AC | PRN
  Administered 2018-12-08: 5 mg via ORAL

## 2018-12-08 MED ORDER — MIDAZOLAM HCL 5 MG/5ML IJ SOLN
INTRAMUSCULAR | Status: DC | PRN
  Administered 2018-12-08: 2 mg via INTRAVENOUS

## 2018-12-08 MED ORDER — FENTANYL CITRATE (PF) 100 MCG/2ML IJ SOLN
INTRAMUSCULAR | Status: DC | PRN
  Administered 2018-12-08: 50 ug via INTRAVENOUS
  Administered 2018-12-08: 100 ug via INTRAVENOUS
  Administered 2018-12-08: 50 ug via INTRAVENOUS

## 2018-12-08 MED ORDER — OXYCODONE HCL 5 MG/5ML PO SOLN: 5.0000 mg | Freq: Once | ORAL | Status: AC | PRN

## 2018-12-08 MED ORDER — SUGAMMADEX SODIUM 500 MG/5ML IV SOLN
INTRAVENOUS | Status: AC
  Filled 2018-12-08: qty 5

## 2018-12-08 MED ORDER — IBUPROFEN 600 MG PO TABS: 600.0000 mg | ORAL_TABLET | Freq: Four times a day (QID) | ORAL | 3 refills | Status: DC | PRN

## 2018-12-08 MED ORDER — MEPERIDINE HCL 25 MG/ML IJ SOLN: 6.2500 mg | INTRAMUSCULAR | Status: DC | PRN

## 2018-12-08 SURGICAL SUPPLY — 29 items
BRIEF STRETCH FOR OB PAD XXL (UNDERPADS AND DIAPERS) ×2 IMPLANT
CATH ROBINSON RED A/P 16FR (CATHETERS) ×1 IMPLANT
COVER MAYO STAND REUSABLE (DRAPES) IMPLANT
DECANTER SPIKE VIAL GLASS SM (MISCELLANEOUS) IMPLANT
DRSG OPSITE POSTOP 3X4 (GAUZE/BANDAGES/DRESSINGS) ×2 IMPLANT
DURAPREP 26ML APPLICATOR (WOUND CARE) ×2 IMPLANT
GAUZE SPONGE 4X4 16PLY XRAY LF (GAUZE/BANDAGES/DRESSINGS) ×2 IMPLANT
GLOVE BIOGEL PI IND STRL 6.5 (GLOVE) ×2 IMPLANT
GLOVE BIOGEL PI IND STRL 7.0 (GLOVE) ×2 IMPLANT
GLOVE BIOGEL PI INDICATOR 6.5 (GLOVE) ×2
GLOVE BIOGEL PI INDICATOR 7.0 (GLOVE) ×2
GLOVE SURG SS PI 6.0 STRL IVOR (GLOVE) ×2 IMPLANT
GOWN STRL REUS W/TWL LRG LVL3 (GOWN DISPOSABLE) ×2 IMPLANT
NS IRRIG 1000ML POUR BTL (IV SOLUTION) IMPLANT
PACK LAPAROSCOPY BASIN (CUSTOM PROCEDURE TRAY) ×2 IMPLANT
PACK TRENDGUARD 450 HYBRID PRO (MISCELLANEOUS) IMPLANT
PACK TRENDGUARD 600 HYBRD PROC (MISCELLANEOUS) IMPLANT
PAD ARMBOARD 7.5X6 YLW CONV (MISCELLANEOUS) ×2 IMPLANT
PAD OB MATERNITY 4.3X12.25 (PERSONAL CARE ITEMS) ×2 IMPLANT
PAD PREP 24X48 CUFFED NSTRL (MISCELLANEOUS) ×2 IMPLANT
SET TUBE SMOKE EVAC HIGH FLOW (TUBING) ×1 IMPLANT
SLEEVE SCD COMPRESS KNEE MED (MISCELLANEOUS) ×3 IMPLANT
SUT MON AB 4-0 PS1 27 (SUTURE) ×2 IMPLANT
SUT VICRYL 0 UR6 27IN ABS (SUTURE) ×2 IMPLANT
TOWEL GREEN STERILE FF (TOWEL DISPOSABLE) ×4 IMPLANT
TRENDGUARD 450 HYBRID PRO PACK (MISCELLANEOUS) ×2
TRENDGUARD 600 HYBRID PROC PK (MISCELLANEOUS)
TROCAR BALLN 12MMX100 BLUNT (TROCAR) ×2 IMPLANT
WARMER LAPAROSCOPE (MISCELLANEOUS) ×2 IMPLANT

## 2018-12-08 NOTE — Anesthesia Postprocedure Evaluation (Signed)
Anesthesia Post Note  Patient: Holly Brown  Procedure(s) Performed: LAPAROSCOPIC TUBAL LIGATION (Bilateral Abdomen)     Patient location during evaluation: PACU Anesthesia Type: General Level of consciousness: awake and alert Pain management: pain level controlled Vital Signs Assessment: post-procedure vital signs reviewed and stable Respiratory status: spontaneous breathing, nonlabored ventilation, respiratory function stable and patient connected to nasal cannula oxygen Cardiovascular status: blood pressure returned to baseline and stable Postop Assessment: no apparent nausea or vomiting Anesthetic complications: no    Last Vitals:  Vitals:   12/08/18 1345 12/08/18 1400  BP: 122/90 121/76  Pulse: 84 77  Resp: 15 15  Temp:    SpO2: 97% 98%    Last Pain:  Vitals:   12/08/18 1345  TempSrc:   PainSc: 5                  ,

## 2018-12-08 NOTE — Discharge Instructions (Signed)
°Post Anesthesia Home Care Instructions ° °Activity: °Get plenty of rest for the remainder of the day. A responsible individual must stay with you for 24 hours following the procedure.  °For the next 24 hours, DO NOT: °-Drive a car °-Operate machinery °-Drink alcoholic beverages °-Take any medication unless instructed by your physician °-Make any legal decisions or sign important papers. ° °Meals: °Start with liquid foods such as gelatin or soup. Progress to regular foods as tolerated. Avoid greasy, spicy, heavy foods. If nausea and/or vomiting occur, drink only clear liquids until the nausea and/or vomiting subsides. Call your physician if vomiting continues. ° °Special Instructions/Symptoms: °Your throat may feel dry or sore from the anesthesia or the breathing tube placed in your throat during surgery. If this causes discomfort, gargle with warm salt water. The discomfort should disappear within 24 hours. ° °If you had a scopolamine patch placed behind your ear for the management of post- operative nausea and/or vomiting: ° °1. The medication in the patch is effective for 72 hours, after which it should be removed.  Wrap patch in a tissue and discard in the trash. Wash hands thoroughly with soap and water. °2. You may remove the patch earlier than 72 hours if you experience unpleasant side effects which may include dry mouth, dizziness or visual disturbances. °3. Avoid touching the patch. Wash your hands with soap and water after contact with the patch. °   °\ ° ° °Laparoscopic Tubal Ligation, Care After °This sheet gives you information about how to care for yourself after your procedure. Your health care provider may also give you more specific instructions. If you have problems or questions, contact your health care provider. °What can I expect after the procedure? °After the procedure, it is common to have: °· A sore throat. °· Discomfort in your shoulder. °· Mild discomfort or cramping in your  abdomen. °· Gas pains. °· Pain or soreness in the area where the surgical incision was made. °· A bloated feeling. °· Tiredness. °· Nausea. °· Vomiting. °Follow these instructions at home: °Medicines °· Take over-the-counter and prescription medicines only as told by your health care provider. °· Do not take aspirin because it can cause bleeding. °· Ask your health care provider if the medicine prescribed to you: °? Requires you to avoid driving or using heavy machinery. °? Can cause constipation. You may need to take actions to prevent or treat constipation, such as: °§ Drink enough fluid to keep your urine pale yellow. °§ Take over-the-counter or prescription medicines. °§ Eat foods that are high in fiber, such as beans, whole grains, and fresh fruits and vegetables. °§ Limit foods that are high in fat and processed sugars, such as fried or sweet foods. °Incision care ° °  ° °· Follow instructions from your health care provider about how to take care of your incision. Make sure you: °? Wash your hands with soap and water before and after you change your bandage (dressing). If soap and water are not available, use hand sanitizer. °? Change your dressing as told by your health care provider. °? Leave stitches (sutures), skin glue, or adhesive strips in place. These skin closures may need to stay in place for 2 weeks or longer. If adhesive strip edges start to loosen and curl up, you may trim the loose edges. Do not remove adhesive strips completely unless your health care provider tells you to do that. °· Check your incision area every day for signs of infection. Check for: °?   Redness, swelling, or pain. °? Fluid or blood. °? Warmth. °? Pus or a bad smell. °Activity °· Rest as told by your health care provider. °· Avoid sitting for a long time without moving. Get up to take short walks every 1-2 hours. This is important to improve blood flow and breathing. Ask for help if you feel weak or unsteady. °· Return to your  normal activities as told by your health care provider. Ask your health care provider what activities are safe for you. °General instructions °· Do not take baths, swim, or use a hot tub until your health care provider approves. Ask your health care provider if you may take showers. You may only be allowed to take sponge baths. °· Have someone help you with your daily household tasks for the first few days. °· Keep all follow-up visits as told by your health care provider. This is important. °Contact a health care provider if: °· You have redness, swelling, or pain around your incision. °· Your incision feels warm to the touch. °· You have pus or a bad smell coming from your incision. °· The edges of your incision break open after the sutures have been removed. °· Your pain does not improve after 2-3 days. °· You have a rash. °· You repeatedly become dizzy or light-headed. °· Your pain medicine is not helping. °Get help right away if you: °· Have a fever. °· Faint. °· Have increasing pain in your abdomen. °· Have severe pain in one or both of your shoulders. °· Have fluid or blood coming from your sutures or from your vagina. °· Have shortness of breath or difficulty breathing. °· Have chest pain or leg pain. °· Have ongoing nausea, vomiting, or diarrhea. °Summary °· After the procedure, it is common to have mild discomfort or cramping in your abdomen. °· Take over-the-counter and prescription medicines only as told by your health care provider. °· Watch for symptoms that should prompt you to call your health care provider. °· Keep all follow-up visits as told by your health care provider. This is important. °This information is not intended to replace advice given to you by your health care provider. Make sure you discuss any questions you have with your health care provider. °Document Released: 10/25/2004 Document Revised: 03/02/2018 Document Reviewed: 03/02/2018 °Elsevier Patient Education © 2020 [employer] ° °

## 2018-12-08 NOTE — Anesthesia Procedure Notes (Signed)
Procedure Name: Intubation Date/Time: 12/08/2018 12:18 PM Performed by: Lyndee Leo, CRNA Pre-anesthesia Checklist: Patient identified, Emergency Drugs available, Suction available and Patient being monitored Patient Re-evaluated:Patient Re-evaluated prior to induction Oxygen Delivery Method: Circle system utilized Preoxygenation: Pre-oxygenation with 100% oxygen Induction Type: IV induction Ventilation: Mask ventilation without difficulty Laryngoscope Size: Mac and 3 Grade View: Grade I Tube type: Oral Tube size: 7.0 mm Number of attempts: 1 Airway Equipment and Method: Stylet and Oral airway Placement Confirmation: ETT inserted through vocal cords under direct vision,  positive ETCO2 and breath sounds checked- equal and bilateral Secured at: 22 cm Tube secured with: Tape Dental Injury: Teeth and Oropharynx as per pre-operative assessment

## 2018-12-08 NOTE — Transfer of Care (Signed)
Immediate Anesthesia Transfer of Care Note  Patient: Holly Brown  Procedure(s) Performed: LAPAROSCOPIC TUBAL LIGATION (Bilateral Abdomen)  Patient Location: PACU  Anesthesia Type:General  Level of Consciousness: awake, sedated and patient cooperative  Airway & Oxygen Therapy: Patient Spontanous Breathing and Patient connected to face mask oxygen  Post-op Assessment: Report given to RN  Post vital signs: Reviewed and stable  Last Vitals:  Vitals Value Taken Time  BP 134/92 12/08/18 1304  Temp    Pulse 110 12/08/18 1305  Resp 22 12/08/18 1305  SpO2 100 % 12/08/18 1305    Last Pain:  Vitals:   12/08/18 1111  TempSrc: Oral  PainSc: 0-No pain      Patients Stated Pain Goal: 5 (73/71/06 2694)  Complications: No apparent anesthesia complications

## 2018-12-08 NOTE — Op Note (Signed)
Holly Brown 12/08/2018  PREOPERATIVE DIAGNOSIS:  Undesired fertility  POSTOPERATIVE DIAGNOSIS:  Undesired fertility  PROCEDURE:  Laparoscopic Bilateral Tubal Sterilization using Bipolar forceps  SURGEON: Dr. Mora Bellman  ANESTHESIA:  General endotracheal  COMPLICATIONS:  None immediate.  ESTIMATED BLOOD LOSS:  Less than 20 ml.  FLUIDS: 1000 ml LR.  URINE OUTPUT:  50 ml of clear urine.  INDICATIONS: 33 y.o. [id number]  with undesired fertility, desires permanent sterilization. Other reversible forms of contraception were discussed with patient; she declines all other modalities.  Risks of procedure discussed with patient including permanence of method, bleeding, infection, injury to surrounding organs and need for additional procedures including laparotomy, risk of regret.  Failure risk of 0.5-1% with increased risk of ectopic gestation if pregnancy occurs was also discussed with patient.      FINDINGS:  Normal uterus, tubes, and ovaries.  TECHNIQUE:  The patient was taken to the operating room where general anesthesia was obtained without difficulty.  She was then placed in the dorsal lithotomy position and prepared and draped in sterile fashion.  After an adequate timeout was performed, a bivalved speculum was then placed in the patient's vagina, and the anterior lip of cervix grasped with the single-tooth tenaculum.  The uterine manipulator was then advanced into the uterus.  The speculum was removed from the vagina.  Attention was then turned to the patient's abdomen where a 10-mm skin incision was made on the umbilical fold.  The underlying fascia was identified, grasped with Kocher clamps, tented up and entered sharply with mayo scissors. The fascia was tagged with 0-Vicryl. The peritoneum was entered bluntly.The 11 mm trocar and sleeve were introduced into the abdominal cavityl.  Intraperitoneal placement was confirmed with the use of the laparoscope. Pneumoperitoneum was  achieved by the insufflation of CO2 gas. A survey of the patient's pelvis and abdomen revealed entirely normal anatomy.  The fallopian tubes were observed and found to be normal in appearance. Bipolar forceps was then advanced through the operative port and used to coagulate a 3-cm portion of the left tube in the mid isthmic area.  Good blanching and coagulation was noted at the site of the application.  There was no bleeding noted in the mesosalpinx.  A similar process was carried out on the right fallopian tube. Good hemostasis was noted overall. The instruments were then removed from the patient's abdomen and the fascial incision was repaired with 0 Vicryl, and the skin was closed with 4-0 Vicryl. The uterine manipulator and the tenaculum were removed from the vagina without complications. The patient tolerated the procedure well.  Sponge, lap, and needle counts were correct times two.  The patient was then taken to the recovery room awake, extubated and in stable  in stable condition.

## 2018-12-08 NOTE — Anesthesia Preprocedure Evaluation (Signed)
Anesthesia Evaluation  Patient identified by MRN, date of birth, ID band Patient awake    Reviewed: Allergy & Precautions, H&P , NPO status , Patient's Chart, lab work & pertinent test results, reviewed documented beta blocker date and time   Airway Mallampati: II  TM Distance: >3 FB Neck ROM: full    Dental no notable dental hx.    Pulmonary neg pulmonary ROS, former smoker,    Pulmonary exam normal breath sounds clear to auscultation       Cardiovascular Exercise Tolerance: Good negative cardio ROS   Rhythm:regular Rate:Normal     Neuro/Psych  Headaches, Seizures -,  negative psych ROS   GI/Hepatic negative GI ROS, Neg liver ROS,   Endo/Other  negative endocrine ROSdiabetes  Renal/GU negative Renal ROS  negative genitourinary   Musculoskeletal   Abdominal   Peds  Hematology negative hematology ROS (+)   Anesthesia Other Findings   Reproductive/Obstetrics negative OB ROS                             Anesthesia Physical Anesthesia Plan  ASA: II  Anesthesia Plan: General   Post-op Pain Management:    Induction: Intravenous  PONV Risk Score and Plan: 3 and Ondansetron, Treatment may vary due to age or medical condition, Dexamethasone and Midazolam  Airway Management Planned: Oral ETT and LMA  Additional Equipment:   Intra-op Plan:   Post-operative Plan: Extubation in OR  Informed Consent: I have reviewed the patients History and Physical, chart, labs and discussed the procedure including the risks, benefits and alternatives for the proposed anesthesia with the patient or authorized representative who has indicated his/her understanding and acceptance.     Dental Advisory Given  Plan Discussed with: CRNA  Anesthesia Plan Comments: (  )        Anesthesia Quick Evaluation

## 2018-12-09 ENCOUNTER — Encounter (HOSPITAL_BASED_OUTPATIENT_CLINIC_OR_DEPARTMENT_OTHER): Payer: Self-pay | Admitting: Obstetrics and Gynecology

## 2018-12-10 ENCOUNTER — Other Ambulatory Visit: Payer: Self-pay

## 2018-12-10 ENCOUNTER — Emergency Department (HOSPITAL_COMMUNITY)
Admission: EM | Admit: 2018-12-10 | Discharge: 2018-12-10 | Disposition: A | Discharge status: Home / Self Care | Payer: 59 | Attending: Emergency Medicine | Admitting: Emergency Medicine

## 2018-12-10 ENCOUNTER — Telehealth: Payer: Self-pay

## 2018-12-10 DIAGNOSIS — N939 Abnormal uterine and vaginal bleeding, unspecified: Secondary | ICD-10-CM | POA: Insufficient documentation

## 2018-12-10 DIAGNOSIS — G8918 Other acute postprocedural pain: Secondary | ICD-10-CM | POA: Diagnosis not present

## 2018-12-10 LAB — CBC WITH DIFFERENTIAL/PLATELET
Abs Immature Granulocytes: 0.07 10*3/uL (ref 0.00–0.07)
Basophils Absolute: 0 10*3/uL (ref 0.0–0.1)
Basophils Relative: 0 %
Eosinophils Absolute: 0.1 10*3/uL (ref 0.0–0.5)
Eosinophils Relative: 0 %
HCT: 41 % (ref 36.0–46.0)
Hemoglobin: 13 g/dL (ref 12.0–15.0)
Immature Granulocytes: 1 %
Lymphocytes Relative: 11 %
Lymphs Abs: 1.5 10*3/uL (ref 0.7–4.0)
MCH: 28.4 pg (ref 26.0–34.0)
MCHC: 31.7 g/dL (ref 30.0–36.0)
MCV: 89.7 fL (ref 80.0–100.0)
Monocytes Absolute: 1 10*3/uL (ref 0.1–1.0)
Monocytes Relative: 7 %
Neutro Abs: 11.6 10*3/uL — ABNORMAL HIGH (ref 1.7–7.7)
Neutrophils Relative %: 81 %
Platelets: 376 10*3/uL (ref 150–400)
RBC: 4.57 MIL/uL (ref 3.87–5.11)
RDW: 14.1 % (ref 11.5–15.5)
WBC: 14.3 10*3/uL — ABNORMAL HIGH (ref 4.0–10.5)
nRBC: 0 % (ref 0.0–0.2)

## 2018-12-10 LAB — BASIC METABOLIC PANEL
Anion gap: 10 (ref 5–15)
BUN: 12 mg/dL (ref 6–20)
CO2: 22 mmol/L (ref 22–32)
Calcium: 9.3 mg/dL (ref 8.9–10.3)
Chloride: 106 mmol/L (ref 98–111)
Creatinine, Ser: 1.08 mg/dL — ABNORMAL HIGH (ref 0.44–1.00)
GFR calc Af Amer: >60 mL/min (ref >60)
GFR calc non Af Amer: >60 mL/min (ref >60)
Glucose, Bld: 89 mg/dL (ref 70–99)
Potassium: 4.1 mmol/L (ref 3.5–5.1)
Sodium: 138 mmol/L (ref 135–145)

## 2018-12-10 LAB — TYPE AND SCREEN
ABO/RH(D): A POS
Antibody Screen: NEGATIVE

## 2018-12-10 MED ORDER — ACETAMINOPHEN 500 MG PO TABS
500.0000 mg | ORAL_TABLET | Freq: Once | ORAL | Status: AC
  Administered 2018-12-10: 500 mg via ORAL
  Filled 2018-12-10: qty 1

## 2018-12-10 MED ORDER — KETOROLAC TROMETHAMINE 10 MG PO TABS: 10.0000 mg | ORAL_TABLET | Freq: Four times a day (QID) | ORAL | 0 refills | Status: DC | PRN

## 2018-12-10 NOTE — ED Provider Notes (Signed)
[facility] Provider [facility]   CSN: [id number] Arrival date & time: 12/10/18  1246     History   Chief Complaint Chief Complaint  Patient presents with  . Incisional Pain    HPI Holly Brown is a 33 y.o. female.  Presents with pain, bleeding related to recent tubal ligation.  Tubal ligation on Wednesday.  No immediate postoperative complications.  Patient states she had been doing well pain was okay soon after surgery.  However since last night she has had a couple episodes of bleeding vaginally.  Reports one quarter sized clot last night and one this morning.  Patient also states that she has been having increased pain around her abdominal incision site.  States pain is 10 out of 10 sharp, stabbing, nonradiating.  Had some improvement with ibuprofen.  Had been taking Percocet but states she felt paranoid while taking this and has since stopped taking Percocet.  No fever, rash, swelling.     HPI  Past Medical History:  Diagnosis Date  . Gestational diabetes    metformin  . History of UTI   . Migraines   . Seizures ([facility])    Jan.2017  . Vaginal Pap smear, abnormal     Patient Active Problem List   Diagnosis Date Noted  . Gestational diabetes mellitus (GDM) controlled on oral hypoglycemic drug 08/31/2018  . NSVD (normal spontaneous vaginal delivery) 08/31/2018  . 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 Gestational diabetes mellitus (GDM) in third trimester controlled on oral hypoglycemic drug 08/03/2018  . Urinary tract infection affecting care of mother in first trimester, antepartum 03/07/2018  . Supervision of high risk pregnancy, antepartum 02/01/2018  . History of preterm delivery, currently pregnant 02/01/2018  . Cervical high risk HPV (human papillomavirus) test positive 05/18/2017  . Gonorrhea affecting pregnancy in first trimester 05/15/2017  . Family history of colon cancer 10/10/2015  . Syncope 07/04/2014  . Heart palpitations  07/04/2014    Past Surgical History:  Procedure Laterality Date  . DILATION AND CURETTAGE OF UTERUS    . LAPAROSCOPIC TUBAL LIGATION Bilateral 12/08/2018   Procedure: LAPAROSCOPIC TUBAL LIGATION;  Surgeon: Catalina Antiguaonstant, Peggy, MD;  Location: [facility];  Service: Gynecology;  Laterality: Bilateral;  . WISDOM TOOTH EXTRACTION  2011     OB History    Gravida  5   Para  4   Term  3   Preterm  1   AB  1   Living  4     SAB  1   TAB      Ectopic      Multiple  0   Live Births  4            Home Medications    Prior to Admission medications   Medication Sig Start Date End Date Taking? Authorizing Provider  cyclobenzaprine (FLEXERIL) 10 MG tablet Take 0.5-1 tablets (5-10 mg total) by mouth 3 (three) times daily as needed for muscle spasms. 03/29/18   Leftwich-Kirby, Wilmer FloorLisa A, CNM  ibuprofen (ADVIL) 600 MG tablet Take 1 tablet (600 mg total) by mouth every 6 (six) hours as needed. 12/08/18   Constant, Peggy, MD  oxyCODONE-acetaminophen (PERCOCET/ROXICET) 5-325 MG tablet Take 1 tablet by mouth every 6 (six) hours as needed. 12/08/18   Constant, Peggy, MD    Family History Family History  Problem Relation Age of Onset  . Colon cancer Father   . Hypertension Maternal Grandmother   . Diabetes Maternal Grandmother   .  Hypertension Paternal Grandmother   . Diabetes Mother     Social History Social History   Tobacco Use  . Smoking status: Former Smoker    Packs/day: 0.25    Types: Cigarettes    Start date: 12/21/2011  . Smokeless tobacco: Never Used  Substance Use Topics  . Alcohol use: Not Currently    Alcohol/week: 0.0 standard drinks    Comment: ocassionally   . Drug use: No     Allergies   Patient has no known allergies.   Review of Systems Review of Systems  Constitutional: Negative for chills and fever.  HENT: Negative for ear pain and sore throat.   Eyes: Negative for pain and visual disturbance.  Respiratory: Negative for cough and  shortness of breath.   Cardiovascular: Negative for chest pain and palpitations.  Gastrointestinal: Positive for abdominal pain. Negative for vomiting.  Genitourinary: Negative for dysuria and hematuria.  Musculoskeletal: Negative for arthralgias and back pain.  Skin: Negative for color change and rash.  Neurological: Negative for seizures and syncope.  All other systems reviewed and are negative.    Physical Exam Updated Vital Signs BP 117/76 (BP Location: Right Arm)   Pulse (!) 106   Temp 99.8 F (37.7 C) (Oral)   Resp 16   Ht 5\' 11"  (1.803 m)   Wt 114.1 kg   LMP 12/05/2018 Comment: "spotting" per patient   SpO2 98%   BMI 35.08 kg/m   Physical Exam Vitals signs and nursing note reviewed.  Constitutional:      General: She is not in acute distress.    Appearance: She is well-developed.  HENT:     Head: Normocephalic and atraumatic.  Eyes:     Conjunctiva/sclera: Conjunctivae normal.  Neck:     Musculoskeletal: Neck supple.  Cardiovascular:     Rate and Rhythm: Normal rate and regular rhythm.     Heart sounds: No murmur.  Pulmonary:     Effort: Pulmonary effort is normal. No respiratory distress.     Breath sounds: Normal breath sounds.  Abdominal:     Palpations: Abdomen is soft.     Comments: Lower abdominal surgical incision site is C/D/I, no erythema or induration noted; generalized TTP throughout abdomen  Skin:    General: Skin is warm and dry.  Neurological:     General: No focal deficit present.     Mental Status: She is alert.  Psychiatric:        Mood and Affect: Mood normal.        Behavior: Behavior normal.      ED Treatments / Results  Labs (all labs ordered are listed, but only abnormal results are displayed) Labs Reviewed  CBC WITH DIFFERENTIAL/PLATELET - Abnormal; Notable for the following components:      Result Value   WBC 14.3 (*)    Neutro Abs 11.6 (*)    All other components within normal limits  BASIC METABOLIC PANEL - Abnormal;  Notable for the following components:   Creatinine, Ser 1.08 (*)    All other components within normal limits  TYPE AND SCREEN    EKG None  Radiology No results found.  Procedures Procedures (including critical care time)  Medications Ordered in ED Medications  acetaminophen (TYLENOL) tablet 500 mg (has no administration in time range)     Initial Impression / Assessment and Plan / ED Course  I have reviewed the triage vital signs and the nursing notes.  Pertinent labs & imaging results that were available  during my care of the patient were reviewed by me and considered in my medical decision making (see chart for details).  Clinical Course as of Dec 09 1533  Fri Dec 10, 2018  1437 Discussed case with Dr. Alysia PennaErvin, some post op pain expected, small to moderate vaginal bleeding also expected, he will arrange follow up in clinic to be seen early next week, likely Monday   [RD]  1443 Updated patient, reviewed results, strict discharge instructions   [RD]    Clinical Course User Index [RD] Milagros Lollykstra,  S, MD      33 year old lady presented to the ER with complaint of vaginal bleeding, postop pain after recent tubal ligation.  On exam here patient was well-appearing, incision site looks well without obvious signs of infection, abdomen was soft but did have some tenderness around incision site.  Discussed the pain and bleeding with gynecology on-call, Dr. Alysia PennaErvin.  States symptoms within expected range.  Hemoglobin stable.  Noted mild white count, given appearance of incision site, soft abdomen, afebrile, low suspicion for abscess, postop infection.  However, reviewed strict return precautions for patient should symptoms worsen or develop fever.  Gynecology will arrange for close outpatient follow-up.    After the discussed management above, the patient was determined to be safe for discharge.  The patient was in agreement with this plan and all questions regarding their care were  answered.  ED return precautions were discussed and the patient will return to the ED with any significant worsening of condition.   Final Clinical Impressions(s) / ED Diagnoses   Final diagnoses:  Post-op pain    ED Discharge Orders    None       Milagros Lollykstra,  S, MD 12/10/18 1537

## 2018-12-10 NOTE — Discharge Instructions (Signed)
Recommend taking anti-inflammatory such as newly prescribed Toradol for ibuprofen as well as Tylenol for pain control.  If you develop fever, worsening abdominal pain, worsening vaginal bleeding, please return to the ER for reassessment.  Please call your OB/GYN office to schedule a close follow-up appointment, ideally for visit on Monday or Tuesday.

## 2018-12-10 NOTE — ED Triage Notes (Signed)
Presents from home via POV for pain and bleeding related tubal ligation on Wednesday. Report passes two quarter sized clots last night and this morning, pain that is 10/10 (stopped taking percocet d/t paranoia, taking advil otherwise). States she presented for poorly controlled pain, bad enough to make her feel SOB.

## 2018-12-10 NOTE — Telephone Encounter (Signed)
Pt c/o severe pain and vaginal bleeding w/ clots C/osoaking a pad.   no relief w/ pain medication Pt states after taking pain medication she felt as if she was having a allergic reaction Pain is 10/10x per pt Consulted w/ provider pt advised to go to ER for evaluation.

## 2018-12-14 ENCOUNTER — Other Ambulatory Visit: Payer: Self-pay | Admitting: *Deleted

## 2018-12-14 DIAGNOSIS — Z30432 Encounter for removal of intrauterine contraceptive device: Secondary | ICD-10-CM

## 2018-12-14 DIAGNOSIS — Z30013 Encounter for initial prescription of injectable contraceptive: Secondary | ICD-10-CM

## 2018-12-14 MED ORDER — MEDROXYPROGESTERONE ACETATE 150 MG/ML IM SUSP: 150.0000 mg | INTRAMUSCULAR | 3 refills | Status: DC

## 2018-12-14 NOTE — Progress Notes (Signed)
Pt called to office stating she wants to continue on Depo after recent tubal and needs Rx.  Rx for Depo sent to pharmacy today. LM on pt VM making her aware and to bring to visit.

## 2018-12-23 ENCOUNTER — Encounter: Payer: Self-pay | Admitting: Obstetrics and Gynecology

## 2018-12-23 ENCOUNTER — Ambulatory Visit (INDEPENDENT_AMBULATORY_CARE_PROVIDER_SITE_OTHER): Payer: 59 | Admitting: Obstetrics and Gynecology

## 2018-12-23 ENCOUNTER — Telehealth: Payer: Self-pay

## 2018-12-23 ENCOUNTER — Other Ambulatory Visit: Payer: Self-pay

## 2018-12-23 VITALS — BP 122/77 | HR 80 | Wt 250.6 lb

## 2018-12-23 DIAGNOSIS — N920 Excessive and frequent menstruation with regular cycle: Secondary | ICD-10-CM | POA: Diagnosis not present

## 2018-12-23 DIAGNOSIS — Z30013 Encounter for initial prescription of injectable contraceptive: Secondary | ICD-10-CM

## 2018-12-23 DIAGNOSIS — Z9889 Other specified postprocedural states: Secondary | ICD-10-CM

## 2018-12-23 MED ORDER — MEDROXYPROGESTERONE ACETATE 150 MG/ML IM SUSP
150.0000 mg | Freq: Once | INTRAMUSCULAR | Status: AC
  Administered 2018-12-23: 150 mg via INTRAMUSCULAR

## 2018-12-23 NOTE — Progress Notes (Signed)
Pt is here for post op appt, BTL on 12/08/18. Pt reports she is no longer bleeding and states she is doing well. Pt reports bleeding stopped on 12/11/18. Depo injection given today. Next depo injection due 11/19-12/3.

## 2018-12-23 NOTE — Progress Notes (Signed)
33 yp P4 s/p Lsc BTL on 12/08/18 presenting today for post op check. Patient reports feeling well without complaints. She denies fever, incisional pain or drainage from her incision.  Past Medical History:  Diagnosis Date  . Gestational diabetes    metformin  . History of UTI   . Migraines   . Seizures ([facility])    Jan.2017  . Vaginal Pap smear, abnormal    Past Surgical History:  Procedure Laterality Date  . DILATION AND CURETTAGE OF UTERUS    . LAPAROSCOPIC TUBAL LIGATION Bilateral 12/08/2018   Procedure: LAPAROSCOPIC TUBAL LIGATION;  Surgeon: Mora Bellman, MD;  Location: [place];  Service: Gynecology;  Laterality: Bilateral;  . WISDOM TOOTH EXTRACTION  2011   Family History  Problem Relation Age of Onset  . Colon cancer Father   . Hypertension Maternal Grandmother   . Diabetes Maternal Grandmother   . Hypertension Paternal Grandmother   . Diabetes Mother    Social History   Tobacco Use  . Smoking status: Former Smoker    Packs/day: 0.25    Types: Cigarettes    Start date: 12/21/2011  . Smokeless tobacco: Never Used  Substance Use Topics  . Alcohol use: Not Currently    Alcohol/week: 0.0 standard drinks    Comment: ocassionally   . Drug use: No   ROS See pertinent in HPI  Blood pressure 122/77, pulse 80, weight 250 lb 9.6 oz (113.7 kg), last menstrual period 12/05/2018, not currently breastfeeding. GENERAL: Well-developed, well-nourished female in no acute distress.  ABDOMEN: Soft, nontender, nondistended. No organomegaly. Incision: honeycomb dressing removed. Incision healed completely without erythema, induration or drainage EXTREMITIES: No cyanosis, clubbing, or edema, 2+ distal pulses.  A/P 33 yo here for post op check s/p Lsc BTL - Patient is medically cleared to resume all activities of daily living - Patient desires to continue with depo-provera for the control of her menorrhagia - RTC in 1 year for annual exam or prn

## 2018-12-23 NOTE — Telephone Encounter (Signed)
Pt requests a return to work letter for 12/28/2018.  PP appt yesterday cleared patient to resume normal activities.  Letter available in My Chart.

## 2019-03-15 ENCOUNTER — Ambulatory Visit: Payer: 59

## 2019-04-19 ENCOUNTER — Other Ambulatory Visit: Payer: Self-pay

## 2019-04-19 ENCOUNTER — Ambulatory Visit (INDEPENDENT_AMBULATORY_CARE_PROVIDER_SITE_OTHER): Payer: 59

## 2019-04-19 ENCOUNTER — Encounter (HOSPITAL_COMMUNITY): Payer: Self-pay

## 2019-04-19 ENCOUNTER — Ambulatory Visit (HOSPITAL_COMMUNITY)
Admission: EM | Admit: 2019-04-19 | Discharge: 2019-04-19 | Disposition: A | Discharge status: Home / Self Care | Payer: 59 | Attending: Family Medicine | Admitting: Family Medicine

## 2019-04-19 DIAGNOSIS — M79671 Pain in right foot: Secondary | ICD-10-CM | POA: Diagnosis not present

## 2019-04-19 DIAGNOSIS — M25571 Pain in right ankle and joints of right foot: Secondary | ICD-10-CM

## 2019-04-19 DIAGNOSIS — S99911A Unspecified injury of right ankle, initial encounter: Secondary | ICD-10-CM | POA: Diagnosis not present

## 2019-04-19 MED ORDER — DICLOFENAC SODIUM 75 MG PO TBEC: 75.0000 mg | DELAYED_RELEASE_TABLET | Freq: Two times a day (BID) | ORAL | 0 refills | Status: DC

## 2019-04-19 NOTE — ED Triage Notes (Signed)
Pt she dropped a weight on her foot at home working out. This happened a month ago.

## 2019-04-19 NOTE — ED Provider Notes (Signed)
[facility]   [id number] 04/19/19 Arrival Time: 9233  ASSESSMENT & PLAN:  1. Pain in joint involving right ankle and foot     I have personally viewed the imaging studies ordered this visit. No fractures or other abnormalities identified.   Meds ordered this encounter  Medications  . diclofenac (VOLTAREN) 75 MG EC tablet    Sig: Take 1 tablet (75 mg total) by mouth 2 (two) times daily.    Dispense:  14 tablet    Refill:  0   Placed in CAM walker to assist with weight bearing. Work note with light duty restrictions given.  Recommend:   Follow-up Information    [facility].   Why: If worsening or failing to improve as anticipated. Contact information: [address] [place] [address] [phone]          Reviewed expectations re: course of current medical issues. Questions answered. Outlined signs and symptoms indicating need for more acute intervention. Patient verbalized understanding. After Visit Summary given.  SUBJECTIVE: History from: patient. Holly Brown is a 33 y.o. female who reports fairly persistent mild to moderate pain of her right dorsal forefoot extending over ankle; described as aching; "occasionally pain shoots into my shin". Onset: gradual. First noted: a couple of days ago. Injury/trama: reports dropping a weight onto her foot 3-4 weeks ago; questions relation. Symptoms have progressed to a point and plateaued since beginning. Aggravating factors: certain movements, weight bearing and prolonged walking/standing. Alleviating factors: rest. Associated symptoms: none reported. Extremity sensation changes or weakness: none. Self treatment: acetaminophen, with minimal relief.  History of similar: no.  Past Surgical History:  Procedure Laterality Date  . DILATION AND CURETTAGE OF UTERUS    . LAPAROSCOPIC TUBAL LIGATION Bilateral 12/08/2018   Procedure: LAPAROSCOPIC TUBAL  LIGATION;  Surgeon: Catalina Antigua, MD;  Location:  SURGERY CENTER;  Service: Gynecology;  Laterality: Bilateral;  . WISDOM TOOTH EXTRACTION  2011     ROS: As per HPI. All other systems negative.    OBJECTIVE:  Vitals:   04/19/19 1008 04/19/19 1011  BP:  110/68  Pulse:  86  Resp:  18  Temp:  98.6 F (37 C)  TempSrc:  Oral  SpO2:  100%  Weight: 111.1 kg     General appearance: alert; no distress HEENT: [place]; AT Neck: supple with FROM Resp: unlabored respirations Extremities: . RLE: warm with well perfused appearance; fairly well localized moderate tenderness over right proximal dorsal foot and ankle; without gross deformities; swelling: none; bruising: none; ankle ROM: normal, with discomfort reported CV: brisk extremity capillary refill of RLE; 2+ DP/PT pulses of RLE. Skin: warm and dry; no visible rashes Neurologic: gait: favors RLE when walking; normal reflexes of RLE; normal sensation of RLE; normal strength of RLE Psychological: alert and cooperative; normal mood and affect  Imaging: DG Ankle Complete Right  Result Date: 04/19/2019 CLINICAL DATA:  Pain.  Trauma approximately 1 month prior EXAM: RIGHT ANKLE - COMPLETE 3+ VIEW COMPARISON:  December 08, 2016 FINDINGS: Frontal, oblique, and lateral views obtained. No fracture or joint effusion. Joint spaces appear normal. No erosive change. Ankle mortise appears intact. IMPRESSION: No evident fracture or appreciable arthropathy. Ankle mortise appears intact. Electronically Signed   By: Bretta Bang III M.D.   On: 04/19/2019 10:51   DG Foot Complete Right  Result Date: 04/19/2019 CLINICAL DATA:  Pain.  Dropped heavy object on foot 1 month prior EXAM: RIGHT FOOT COMPLETE -  3+ VIEW COMPARISON:  None. FINDINGS: Frontal, oblique, and lateral views obtained. No fracture or dislocation. Joint spaces appear normal. No erosive change. IMPRESSION: No fracture or dislocation.  No evident arthropathy. Electronically Signed    By: Lowella Grip III M.D.   On: 04/19/2019 10:50      No Known Allergies  Past Medical History:  Diagnosis Date  . Gestational diabetes    metformin  . History of UTI   . Migraines   . Seizures ([place])    Jan.2017  . Vaginal Pap smear, abnormal    Social History   Socioeconomic History  . Marital status: Single    Spouse name: Not on file  . Number of children: 3  . Years of education: college  . Highest education level: Not on file  Occupational History  . Occupation: Materials engineer   Tobacco Use  . Smoking status: Former Smoker    Packs/day: 0.25    Types: Cigarettes    Start date: 12/21/2011  . Smokeless tobacco: Never Used  Substance and Sexual Activity  . Alcohol use: Not Currently    Alcohol/week: 0.0 standard drinks    Comment: ocassionally   . Drug use: No  . Sexual activity: Not Currently    Partners: Male    Birth control/protection: None  Other Topics Concern  . Not on file  Social History Narrative   Lives with her grandmother and her three children   Does not drink caffeine    Social Determinants of Health   Financial Resource Strain:   . Difficulty of Paying Living Expenses: Not on file  Food Insecurity:   . Worried About Charity fundraiser in the Last Year: Not on file  . Ran Out of Food in the Last Year: Not on file  Transportation Needs:   . Lack of Transportation (Medical): Not on file  . Lack of Transportation (Non-Medical): Not on file  Physical Activity:   . Days of Exercise per Week: Not on file  . Minutes of Exercise per Session: Not on file  Stress: No Stress Concern Present  . Feeling of Stress : Not at all  Social Connections: Unknown  . Frequency of Communication with Friends and Family: More than three times a week  . Frequency of Social Gatherings with Friends and Family: More than three times a week  . Attends Religious Services: Not on file  . Active Member of Clubs or Organizations: Not on file  . Attends English as a second language teacher Meetings: Not on file  . Marital Status: Not on file   Family History  Problem Relation Age of Onset  . Colon cancer Father   . Hypertension Maternal Grandmother   . Diabetes Maternal Grandmother   . Hypertension Paternal Grandmother   . Diabetes Mother    Past Surgical History:  Procedure Laterality Date  . DILATION AND CURETTAGE OF UTERUS    . LAPAROSCOPIC TUBAL LIGATION Bilateral 12/08/2018   Procedure: LAPAROSCOPIC TUBAL LIGATION;  Surgeon: Mora Bellman, MD;  Location: [place];  Service: Gynecology;  Laterality: Bilateral;  . WISDOM TOOTH EXTRACTION  2011      Vanessa Kick, MD 04/19/19 1126

## 2019-05-31 ENCOUNTER — Ambulatory Visit (INDEPENDENT_AMBULATORY_CARE_PROVIDER_SITE_OTHER): Payer: 59

## 2019-05-31 ENCOUNTER — Other Ambulatory Visit: Payer: Self-pay

## 2019-05-31 DIAGNOSIS — Z3042 Encounter for surveillance of injectable contraceptive: Secondary | ICD-10-CM

## 2019-05-31 DIAGNOSIS — Z3202 Encounter for pregnancy test, result negative: Secondary | ICD-10-CM

## 2019-05-31 LAB — POCT URINE PREGNANCY: Preg Test, Ur: NEGATIVE

## 2019-05-31 NOTE — Progress Notes (Signed)
Holly Brown is here for restart depo.  This is her first upt. UPT negative. Pt scheduled to come back in 2 weeks for second upt and restart depo. -EH/RMA

## 2019-06-14 ENCOUNTER — Ambulatory Visit: Payer: 59

## 2019-06-15 ENCOUNTER — Ambulatory Visit: Payer: 59

## 2019-06-15 ENCOUNTER — Ambulatory Visit (INDEPENDENT_AMBULATORY_CARE_PROVIDER_SITE_OTHER): Payer: 59

## 2019-06-15 ENCOUNTER — Other Ambulatory Visit: Payer: Self-pay

## 2019-06-15 VITALS — BP 118/70 | HR 84 | Wt 259.0 lb

## 2019-06-15 DIAGNOSIS — Z3202 Encounter for pregnancy test, result negative: Secondary | ICD-10-CM | POA: Diagnosis not present

## 2019-06-15 DIAGNOSIS — Z3042 Encounter for surveillance of injectable contraceptive: Secondary | ICD-10-CM

## 2019-06-15 LAB — POCT URINE PREGNANCY: Preg Test, Ur: NEGATIVE

## 2019-06-15 MED ORDER — MEDROXYPROGESTERONE ACETATE 150 MG/ML IM SUSP
150.0000 mg | Freq: Once | INTRAMUSCULAR | Status: AC
  Administered 2019-06-15: 16:00:00 150 mg via INTRAMUSCULAR

## 2019-06-15 NOTE — Progress Notes (Signed)
GYN presents for 2nd UPT/DEPO restart. UPT today is NEGATIVE. DEPO given in RUOQ, tolerated well.  Next DEPO May 12-26, 2021  Administrations This Visit    medroxyPROGESTERone (DEPO-PROVERA) injection 150 mg    Admin Date 06/15/2019 Action Given Dose 150 mg Route Intramuscular Administered By Maretta Bees, RMA

## 2019-06-16 NOTE — Progress Notes (Signed)
Patient seen and assessed by nursing staff during this encounter. I have reviewed the chart and agree with the documentation and plan.   , MD 06/16/2019 10:24 AM    

## 2019-07-31 ENCOUNTER — Emergency Department (HOSPITAL_COMMUNITY): Payer: 59

## 2019-07-31 ENCOUNTER — Emergency Department (HOSPITAL_COMMUNITY)
Admission: EM | Admit: 2019-07-31 | Discharge: 2019-07-31 | Disposition: A | Discharge status: Home / Self Care | Payer: 59 | Attending: Emergency Medicine | Admitting: Emergency Medicine

## 2019-07-31 ENCOUNTER — Encounter (HOSPITAL_COMMUNITY): Payer: Self-pay | Admitting: Emergency Medicine

## 2019-07-31 ENCOUNTER — Other Ambulatory Visit: Payer: Self-pay

## 2019-07-31 DIAGNOSIS — X500XXA Overexertion from strenuous movement or load, initial encounter: Secondary | ICD-10-CM | POA: Diagnosis not present

## 2019-07-31 DIAGNOSIS — Y929 Unspecified place or not applicable: Secondary | ICD-10-CM | POA: Insufficient documentation

## 2019-07-31 DIAGNOSIS — S6991XA Unspecified injury of right wrist, hand and finger(s), initial encounter: Secondary | ICD-10-CM | POA: Diagnosis not present

## 2019-07-31 DIAGNOSIS — Y999 Unspecified external cause status: Secondary | ICD-10-CM | POA: Diagnosis not present

## 2019-07-31 DIAGNOSIS — Z87891 Personal history of nicotine dependence: Secondary | ICD-10-CM | POA: Insufficient documentation

## 2019-07-31 DIAGNOSIS — Y939 Activity, unspecified: Secondary | ICD-10-CM | POA: Diagnosis not present

## 2019-07-31 MED ORDER — IBUPROFEN 800 MG PO TABS
800.0000 mg | ORAL_TABLET | Freq: Once | ORAL | Status: AC
  Administered 2019-07-31: 04:00:00 800 mg via ORAL
  Filled 2019-07-31: qty 1

## 2019-07-31 NOTE — ED Provider Notes (Signed)
[facility] Provider Note   CSN: [id number] Arrival date & time: 07/31/19  0245     History Chief Complaint  Patient presents with  . Finger Injury    Holly Brown is a 34 y.o. female with a hx of seizures presents to the Emergency Department complaining of acute, persistent pain and decreased range of motion in the right middle finger onset 3 days ago.  Patient reports she was closing the trunk of her car when her right middle finger "bent all the way back."  She reports immediate pain that has been persistent since that time.  She reports she is unable to bend that finger due to severe pain.  No numbness or tingling in noted.  No open wounds.  Patient denies previous injury to the hand.  Movement and palpation make her symptoms worse.  Nothing seems to make them better.   The history is provided by the patient and medical records. No language interpreter was used.       Past Medical History:  Diagnosis Date  . Gestational diabetes    metformin  . History of UTI   . Migraines   . Seizures ([place])    Jan.2017  . Vaginal Pap smear, abnormal     Patient Active Problem List   Diagnosis Date Noted  . Gestational diabetes mellitus (GDM) controlled on oral hypoglycemic drug 08/31/2018  . NSVD (normal spontaneous vaginal delivery) 08/31/2018  . 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 Gestational diabetes mellitus (GDM) in third trimester controlled on oral hypoglycemic drug 08/03/2018  . Urinary tract infection affecting care of mother in first trimester, antepartum 03/07/2018  . Supervision of high risk pregnancy, antepartum 02/01/2018  . History of preterm delivery, currently pregnant 02/01/2018  . Cervical high risk HPV (human papillomavirus) test positive 05/18/2017  . Gonorrhea affecting pregnancy in first trimester 05/15/2017  . Family history of colon cancer 10/10/2015  . Syncope 07/04/2014  . Heart palpitations 07/04/2014    Past Surgical  History:  Procedure Laterality Date  . DILATION AND CURETTAGE OF UTERUS    . LAPAROSCOPIC TUBAL LIGATION Bilateral 12/08/2018   Procedure: LAPAROSCOPIC TUBAL LIGATION;  Surgeon: Mora Bellman, MD;  Location: [place];  Service: Gynecology;  Laterality: Bilateral;  . WISDOM TOOTH EXTRACTION  2011     OB History    Gravida  5   Para  4   Term  3   Preterm  1   AB  1   Living  4     SAB  1   TAB      Ectopic      Multiple  0   Live Births  4           Family History  Problem Relation Age of Onset  . Colon cancer Father   . Hypertension Maternal Grandmother   . Diabetes Maternal Grandmother   . Hypertension Paternal Grandmother   . Diabetes Mother     Social History   Tobacco Use  . Smoking status: Former Smoker    Packs/day: 0.25    Types: Cigarettes    Start date: 12/21/2011  . Smokeless tobacco: Never Used  Substance Use Topics  . Alcohol use: Not Currently    Alcohol/week: 0.0 standard drinks    Comment: ocassionally   . Drug use: No    Home Medications Prior to Admission medications   Medication Sig Start Date End Date Taking? Authorizing Provider  cyclobenzaprine (FLEXERIL) 10 MG tablet  Take 0.5-1 tablets (5-10 mg total) by mouth 3 (three) times daily as needed for muscle spasms. Patient not taking: Reported on 12/23/2018 03/29/18   Leftwich-Kirby, Wilmer Floor, CNM  diclofenac (VOLTAREN) 75 MG EC tablet Take 1 tablet (75 mg total) by mouth 2 (two) times daily. 04/19/19   Mardella Layman, MD  ibuprofen (ADVIL) 600 MG tablet Take 1 tablet (600 mg total) by mouth every 6 (six) hours as needed. Patient not taking: Reported on 12/23/2018 12/08/18   Constant, Peggy, MD  ketorolac (TORADOL) 10 MG tablet Take 1 tablet (10 mg total) by mouth every 6 (six) hours as needed. 12/10/18   Milagros Loll, MD  medroxyPROGESTERone (DEPO-PROVERA) 150 MG/ML injection Inject 1 mL (150 mg total) into the muscle every 3 (three) months. Patient not taking:  Reported on 12/23/2018 12/14/18   Constant, Peggy, MD  oxyCODONE-acetaminophen (PERCOCET/ROXICET) 5-325 MG tablet Take 1 tablet by mouth every 6 (six) hours as needed. 12/08/18   Constant, Peggy, MD    Allergies    Patient has no known allergies.  Review of Systems   Review of Systems  Constitutional: Negative for chills and fever.  Gastrointestinal: Negative for nausea and vomiting.  Musculoskeletal: Positive for arthralgias and joint swelling. Negative for back pain, neck pain and neck stiffness.  Skin: Negative for wound.  Neurological: Negative for numbness.  Hematological: Does not bruise/bleed easily.  Psychiatric/Behavioral: The patient is not nervous/anxious.   All other systems reviewed and are negative.   Physical Exam Updated Vital Signs BP 131/85 (BP Location: Left Arm)   Pulse 93   Temp 98.8 F (37.1 C) (Oral)   Resp 16   SpO2 100%   Physical Exam Vitals and nursing note reviewed.  Constitutional:      General: She is not in acute distress.    Appearance: She is well-developed.  HENT:     Head: Normocephalic.  Eyes:     General: No scleral icterus.    Conjunctiva/sclera: Conjunctivae normal.  Cardiovascular:     Rate and Rhythm: Normal rate.  Pulmonary:     Effort: Pulmonary effort is normal.  Musculoskeletal:     Right hand: Swelling present. Decreased range of motion. Normal sensation. There is no disruption of two-point discrimination.     Cervical back: Normal range of motion.     Comments: Full range of motion of the DIP, PIP and MCP of the thumb, pointer, ring and little fingers on the right hand.  Patient with flexion to 90 degrees at the DIP with isolation.  Patient unable to actively flex PIP.  Crepitus with passive PIP flexion.  Active flexion to 90 degrees at the MCP.  Skin:    General: Skin is warm and dry.  Neurological:     Mental Status: She is alert.     Comments: Sensation intact to normal touch throughout the entire distribution of the  right hand.  Strength 5/5 with flexion extension at the DIP, MCP and PIP of the thumb, pointer finger, ring finger and little finger.  Strength 5/5 with isolated flexion at the DIP of the right middle finger but unable to range the PIP of the right middle finger.     ED Results / Procedures / Treatments    Radiology DG Finger Middle Right  Result Date: 07/31/2019 CLINICAL DATA:  Trauma EXAM: RIGHT MIDDLE FINGER 2+V COMPARISON:  None. FINDINGS: There is no evidence of fracture or dislocation. There is no evidence of arthropathy or other focal bone abnormality. Soft tissues  are unremarkable. IMPRESSION: Negative. Electronically Signed   By: Jasmine Pang M.D.   On: 07/31/2019 03:59    Procedures Procedures (including critical care time)  Medications Ordered in ED Medications  ibuprofen (ADVIL) tablet 800 mg (800 mg Oral Given 07/31/19 0425)    ED Course  I have reviewed the triage vital signs and the nursing notes.  Pertinent labs & imaging results that were available during my care of the patient were reviewed by me and considered in my medical decision making (see chart for details).    MDM Rules/Calculators/A&P                       Patient X-Ray negative for obvious fracture or dislocation.  Personally evaluated these images.  Clinical exam however is concerning for injury/rupture of the flexor tendon of the right middle finger.  No open wounds.  Patient splinted in full extension and will be referred to hand surgery.  Pain managed in ED. Pt advised to follow up with orthopedics for further evaluation and treatment.  Conservative therapy recommended and discussed. Patient will be dc home & is agreeable with above plan.  Final Clinical Impression(s) / ED Diagnoses Final diagnoses:  Injury of finger of right hand, initial encounter    Rx / DC Orders ED Discharge Orders    None       , Boyd Kerbs 07/31/19 4742    Glynn Octave, MD 07/31/19 0700

## 2019-07-31 NOTE — ED Triage Notes (Signed)
Pt c/o pain to R middle finger, pt states he bent finger back while attempting to close her trunk 3 days ago. Swelling noted.

## 2019-07-31 NOTE — Discharge Instructions (Addendum)
1. Medications: alternate naprosyn and tylenol for pain control, usual home medications 2. Treatment: rest, ice, elevate; keep splint in place, drink plenty of fluids, gentle stretching 3. Follow Up: Please followup with orthopedics as directed; Please return to the ER for worsening symptoms or other concerns

## 2019-08-03 DIAGNOSIS — S63632A Sprain of interphalangeal joint of right middle finger, initial encounter: Secondary | ICD-10-CM | POA: Diagnosis not present

## 2019-09-07 ENCOUNTER — Other Ambulatory Visit: Payer: Self-pay

## 2019-09-07 ENCOUNTER — Ambulatory Visit (INDEPENDENT_AMBULATORY_CARE_PROVIDER_SITE_OTHER): Payer: 59

## 2019-09-07 VITALS — BP 113/71 | HR 67 | Ht 71.0 in | Wt 250.0 lb

## 2019-09-07 DIAGNOSIS — Z3042 Encounter for surveillance of injectable contraceptive: Secondary | ICD-10-CM | POA: Diagnosis not present

## 2019-09-07 MED ORDER — MEDROXYPROGESTERONE ACETATE 150 MG/ML IM SUSP
150.0000 mg | Freq: Once | INTRAMUSCULAR | Status: AC
  Administered 2019-09-07: 150 mg via INTRAMUSCULAR

## 2019-09-07 NOTE — Progress Notes (Signed)
I have reviewed this chart and agree with the RN/CMA assessment and management.    K. Meryl , M.D. Attending [facility] (Faculty Practice)   

## 2019-09-07 NOTE — Progress Notes (Signed)
GYN presents for DEPO, given in LUOQ, tolerated well.   Next DEPO August 4-18, 2021   Administrations This Visit    medroxyPROGESTERone (DEPO-PROVERA) injection 150 mg    Admin Date 09/07/2019 Action Given Dose 150 mg Route Intramuscular Administered By Maretta Bees, RMA

## 2019-09-08 DIAGNOSIS — Z03818 Encounter for observation for suspected exposure to other biological agents ruled out: Secondary | ICD-10-CM | POA: Diagnosis not present

## 2019-09-08 DIAGNOSIS — Z20822 Contact with and (suspected) exposure to covid-19: Secondary | ICD-10-CM | POA: Diagnosis not present

## 2019-11-16 IMAGING — US US MFM OB FOLLOW-UP
1 series · 14 of 28 positions shown · non-contrast
Comparison: none

[Series 1: us mfm ob follow-up · 32 acquisitions, 14 frames shown]
[im 2/32]
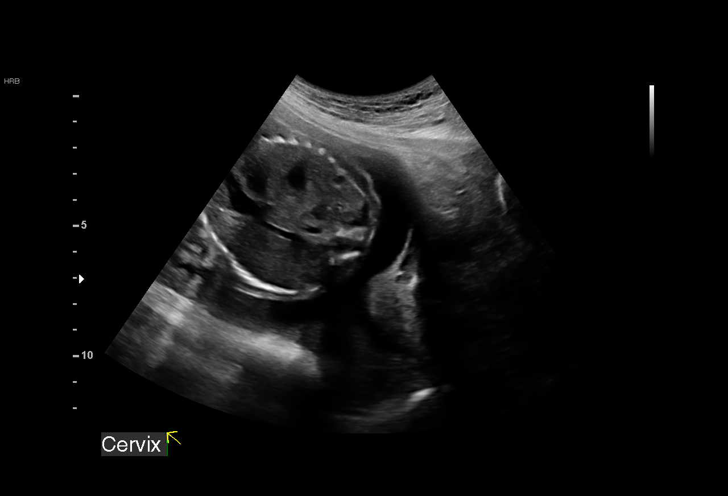
[im 4/32]
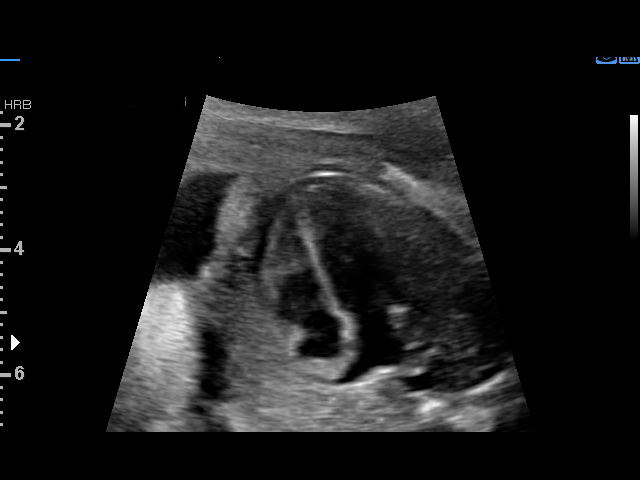
[im 6/32]
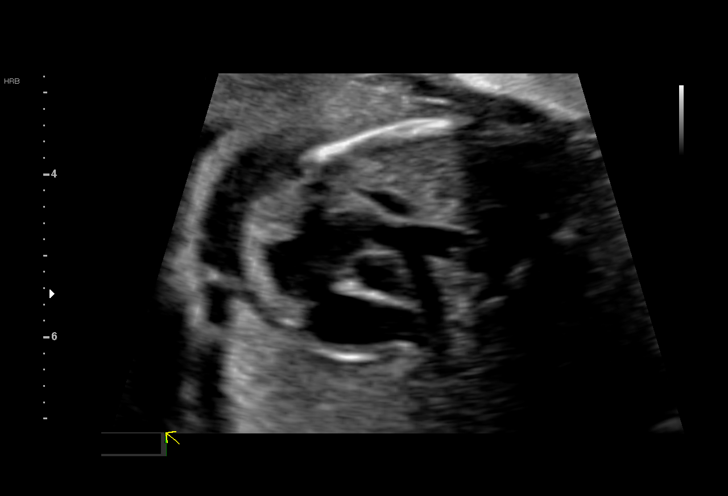
[im 9/32]
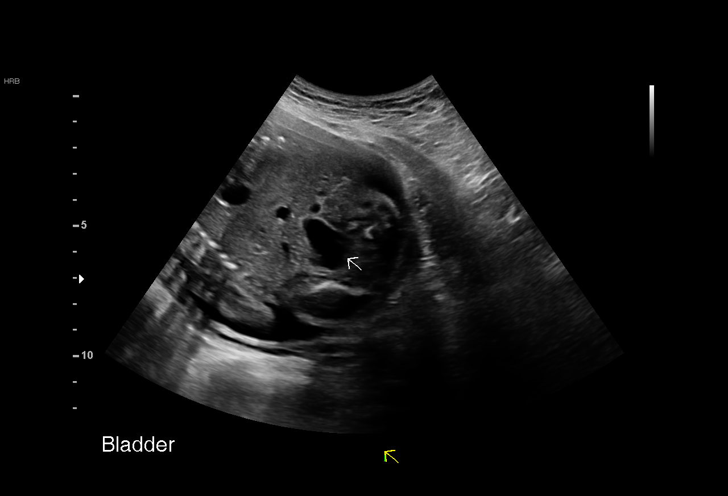
[im 11/32]
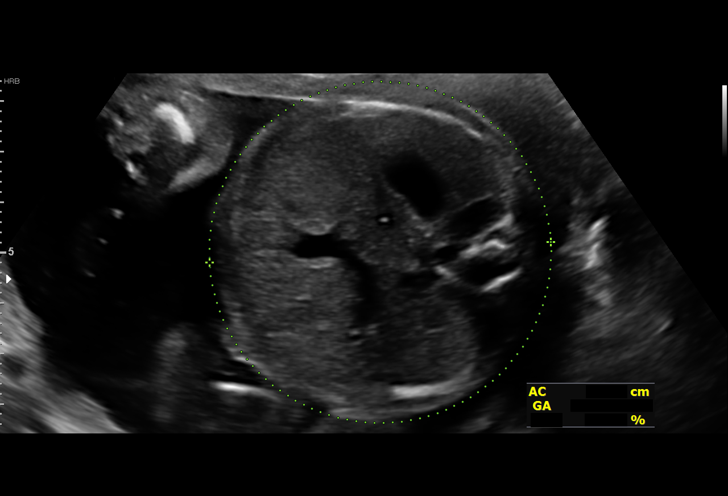
[im 13/32]
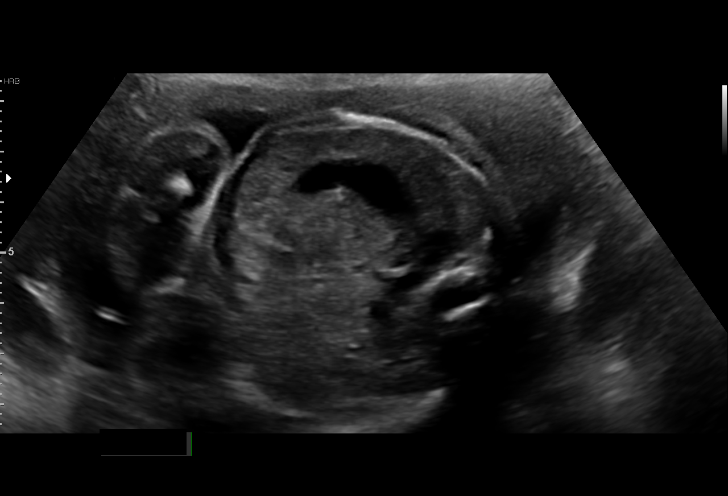
[im 15/32]
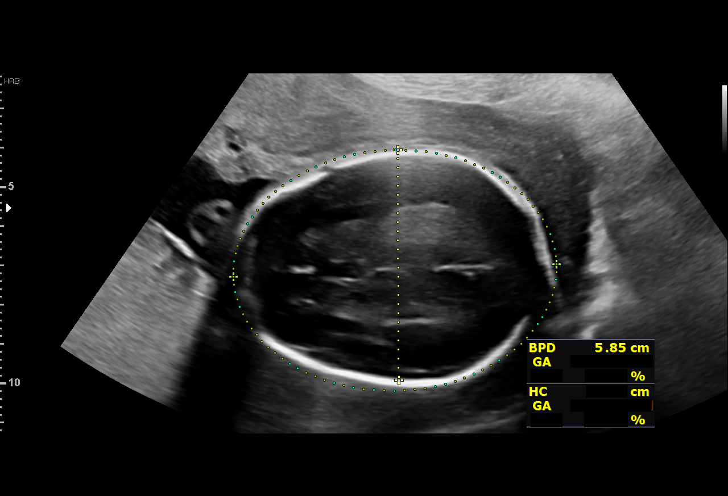
[im 18/32]
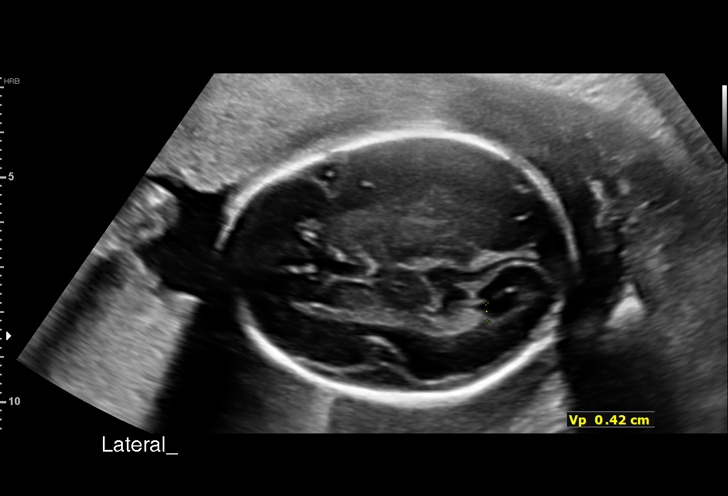
[im 20/32]
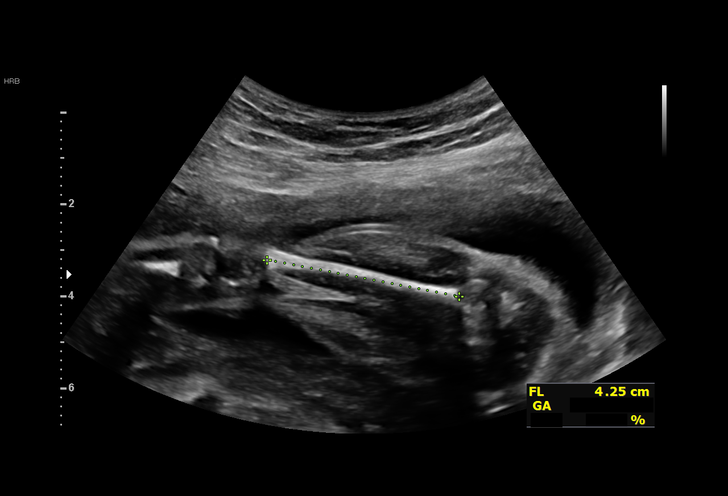
[im 22/32]
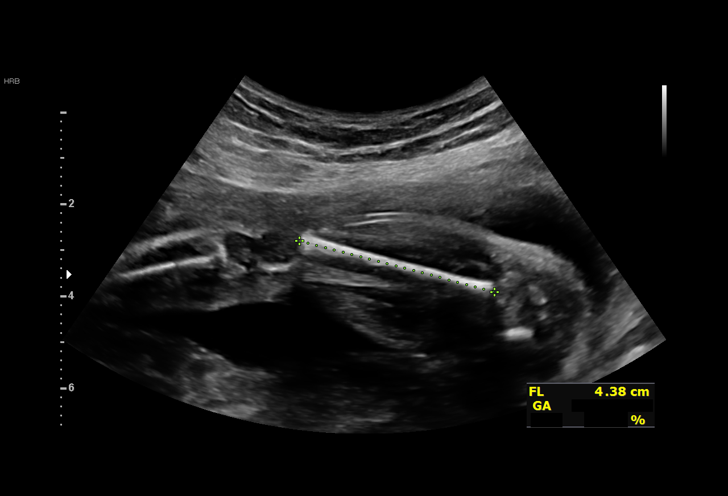
[im 25/32]
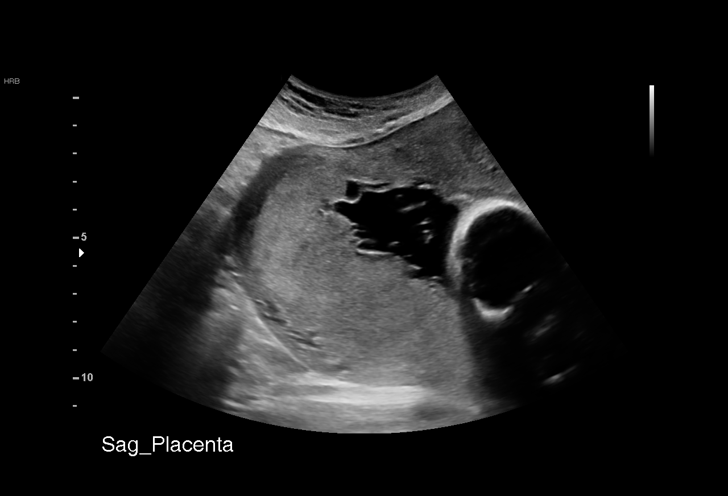
[im 27/32]
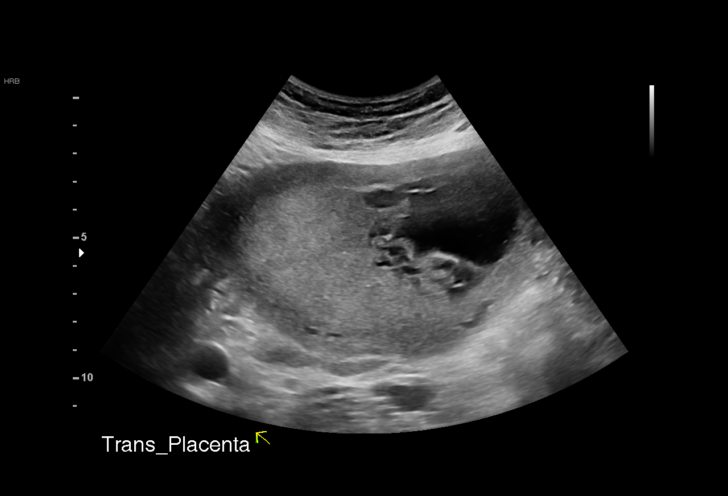
[im 29/32]
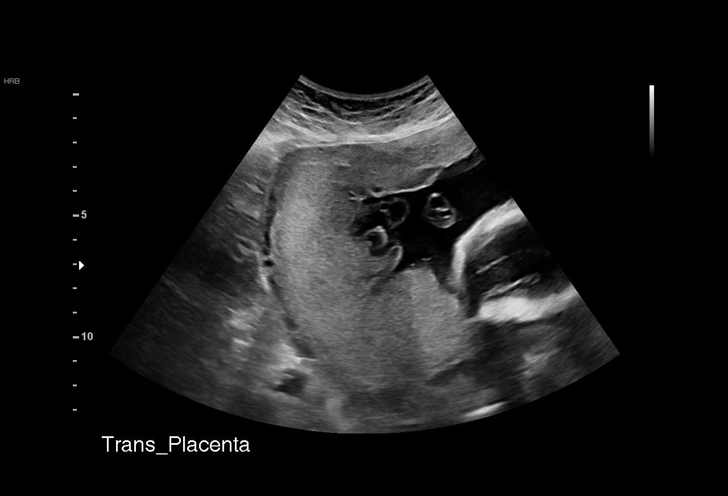
[im 32/32]
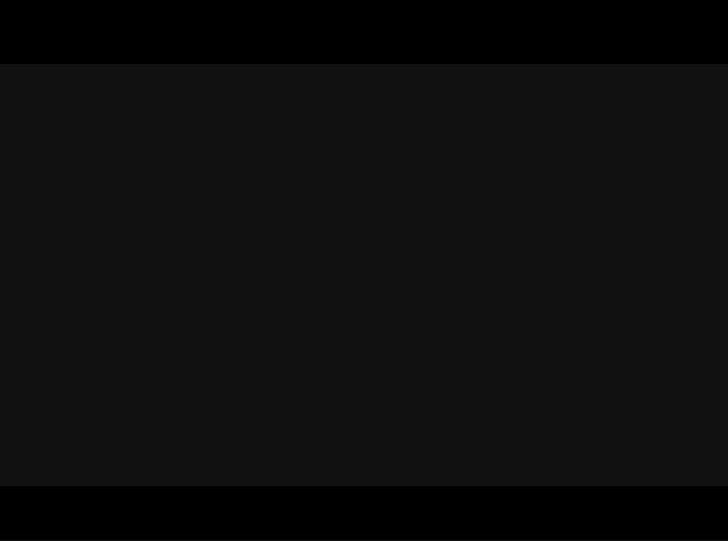

[14 of 28 positions shown; findings below may reference images not displayed]

[REDACTED]care -
                                                             [REDACTED]

 ----------------------------------------------------------------------

 ----------------------------------------------------------------------
Indications

  24 weeks gestation of pregnancy
  Encounter for antenatal screening for
  malformations
  Obesity complicating pregnancy, second
  trimester
  Poor obstetric history: Previous preterm
  delivery, antepartum (36 weeks)
 ----------------------------------------------------------------------
Vital Signs

 BMI:
Fetal Evaluation

 Num Of Fetuses:          1
 Cardiac Activity:        Observed
 Presentation:            Breech
 Placenta:                Posterior
 P. Cord Insertion:       Previously Visualized

 Amniotic Fluid
 AFI FV:      Within normal limits

                             Largest Pocket(cm)

Biometry

 BPD:      59.4  mm     G. Age:  24w 2d         42  %    CI:        73.23   %    70 - 86
                                                         FL/HC:       19.9  %    18.7 -
 HC:      220.6  mm     G. Age:  24w 0d         25  %    HC/AC:       1.06       1.05 -
 AC:      208.4  mm     G. Age:  25w 3d         75  %    FL/BPD:      73.7  %    71 - 87
 FL:       43.8  mm     G. Age:  24w 3d         41  %    FL/AC:       21.0  %    20 - 24
 HUM:      41.4  mm     G. Age:  25w 0d         59  %

 LV:        4.2  mm

 Est. FW:     741   gm   1 lb 10 oz      63  %
OB History

 Gravidity:    5         Term:   2        Prem:   1        SAB:   1
 TOP:          0       Ectopic:  0        Living: 3
Gestational Age

 LMP:           25w 6d        Date:  11/20/17                 EDD:   08/27/18
 U/S Today:     24w 4d                                        EDD:   09/05/18
 Best:          24w 2d     Det. By:  Early Ultrasound         EDD:   09/07/18
                                     (01/20/18)
Anatomy

 Cranium:               Appears normal         Aortic Arch:            Previously seen
 Cavum:                 Appears normal         Ductal Arch:            Appears normal
 Ventricles:            Appears normal         Diaphragm:              Previously seen
 Choroid Plexus:        Previously seen        Stomach:                Appears normal, left
                                                                       sided
 Cerebellum:            Previously seen        Abdomen:                Appears normal
 Posterior Fossa:       Previously seen        Abdominal Wall:         Appears nml (cord
                                                                       insert, abd wall)
 Nuchal Fold:           Previously seen        Cord Vessels:           Previously seen
 Face:                  Orbits and profile     Kidneys:                Appear normal
                        previously seen
 Lips:                  Previously seen        Bladder:                Appears normal
 Thoracic:              Appears normal         Spine:                  Previously seen
 Heart:                 Appears normal         Upper Extremities:      Previously seen
                        (4CH, axis, and situs
 RVOT:                  Appears normal         Lower Extremities:      Previously seen
 LVOT:                  Previously seen

 Other:  Female gender. Left Heel and 5th digit previously visualized. Right
         heel visualized.
Cervix Uterus Adnexa

 Cervix
 Length:           3.66  cm.
 Not visualized (advanced GA >17wks)
Impression

 Normal interval growth.
Recommendations

 Follow up as clinically indicated.

## 2019-11-30 ENCOUNTER — Ambulatory Visit: Payer: Medicaid Other

## 2019-12-12 ENCOUNTER — Other Ambulatory Visit: Payer: Self-pay

## 2019-12-12 ENCOUNTER — Ambulatory Visit (INDEPENDENT_AMBULATORY_CARE_PROVIDER_SITE_OTHER): Payer: Medicaid Other

## 2019-12-12 VITALS — BP 118/78 | HR 74 | Ht 71.0 in | Wt 245.0 lb

## 2019-12-12 DIAGNOSIS — Z3042 Encounter for surveillance of injectable contraceptive: Secondary | ICD-10-CM | POA: Diagnosis not present

## 2019-12-12 MED ORDER — MEDROXYPROGESTERONE ACETATE 150 MG/ML IM SUSP
150.0000 mg | Freq: Once | INTRAMUSCULAR | Status: AC
  Administered 2019-12-12: 150 mg via INTRAMUSCULAR

## 2019-12-12 NOTE — Progress Notes (Signed)
Agree with A & P. 

## 2019-12-12 NOTE — Progress Notes (Signed)
GYN presents for DEPO, given in RUOQ, tolerated well. Needs Annual Exam before next DEPO.  Next DEPO due Nov. 8-22, 2021  Administrations This Visit    medroxyPROGESTERone (DEPO-PROVERA) injection 150 mg    Admin Date 12/12/2019 Action Given Dose 150 mg Route Intramuscular Administered By Maretta Bees, RMA

## 2019-12-29 ENCOUNTER — Ambulatory Visit: Payer: Medicaid Other | Admitting: Obstetrics

## 2020-02-19 IMAGING — US US MFM FETAL BPP WO NON STRESS
1 series · 15 of 28 positions shown · non-contrast
Comparison: none

[Series 1: us mfm fetal bpp wo non stress · 28 acquisitions, 15 frames shown]
[im 1/28]
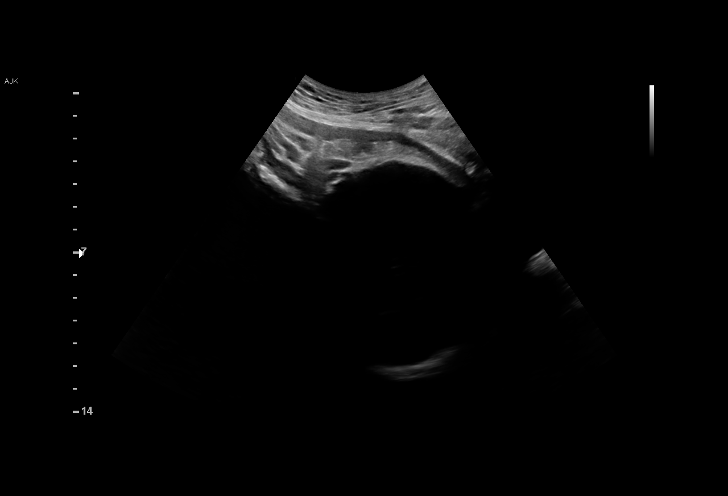
[im 3/28]
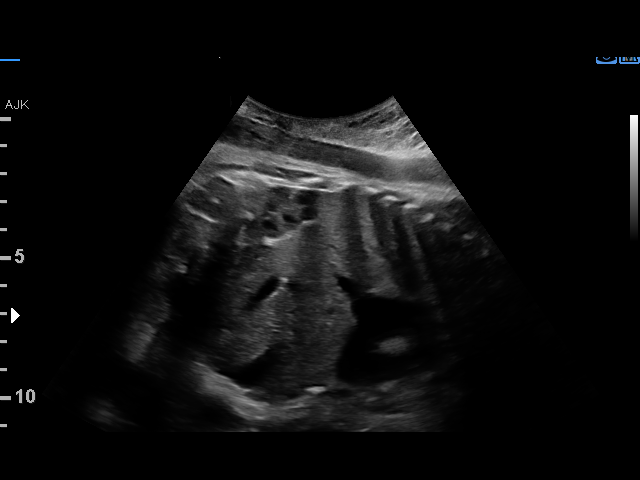
[im 5/28]
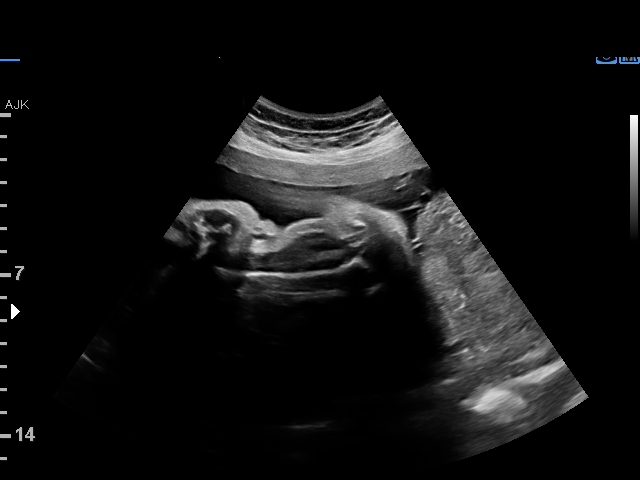
[im 7/28]
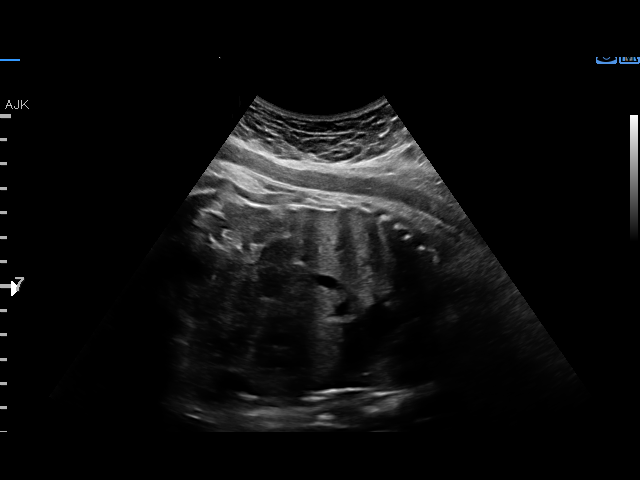
[im 9/28]
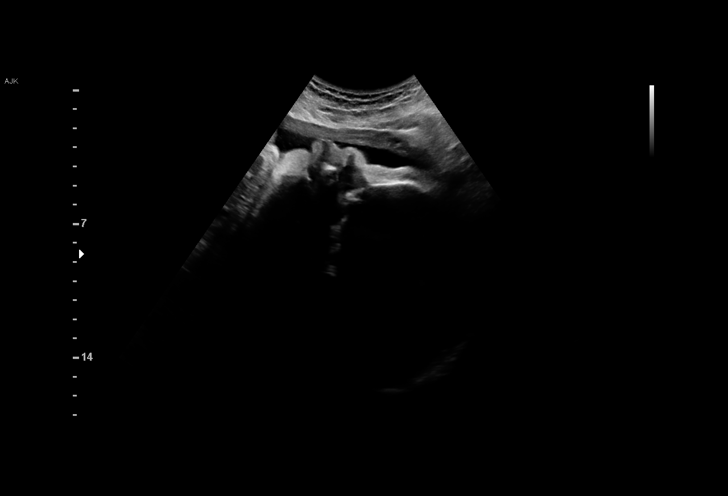
[im 11/28]
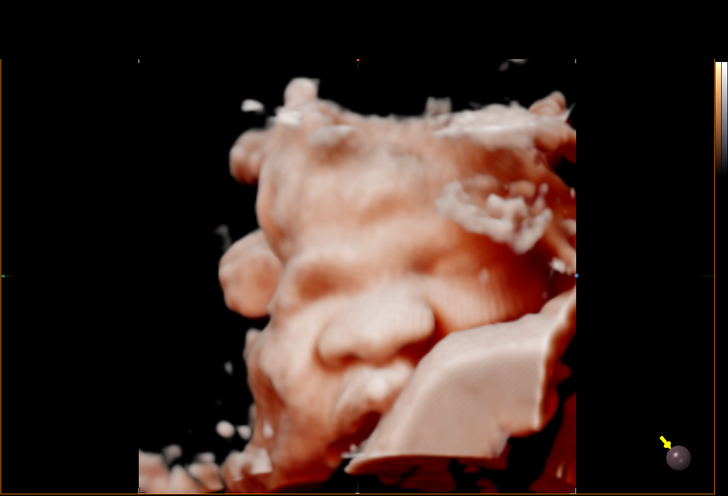
[im 13/28]
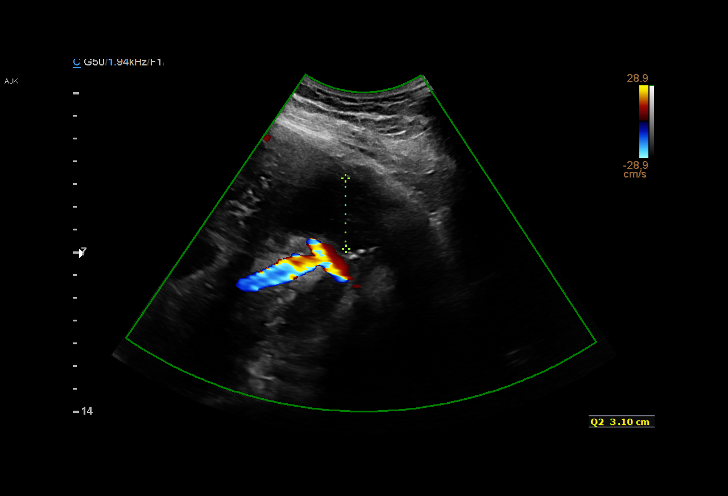
[im 15/28]
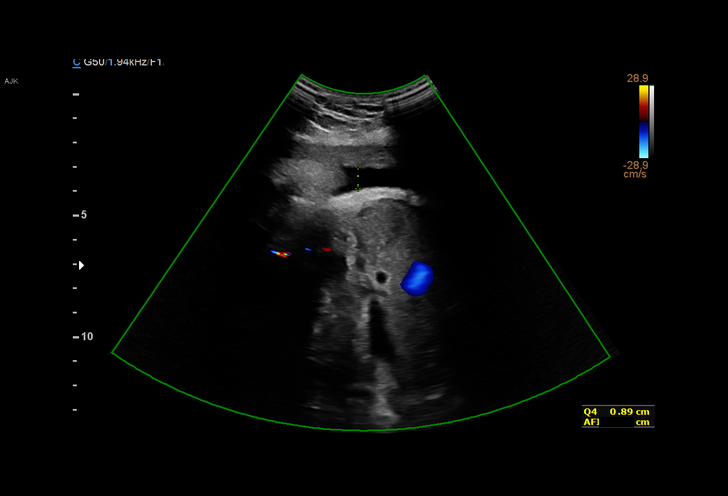
[im 16/28]
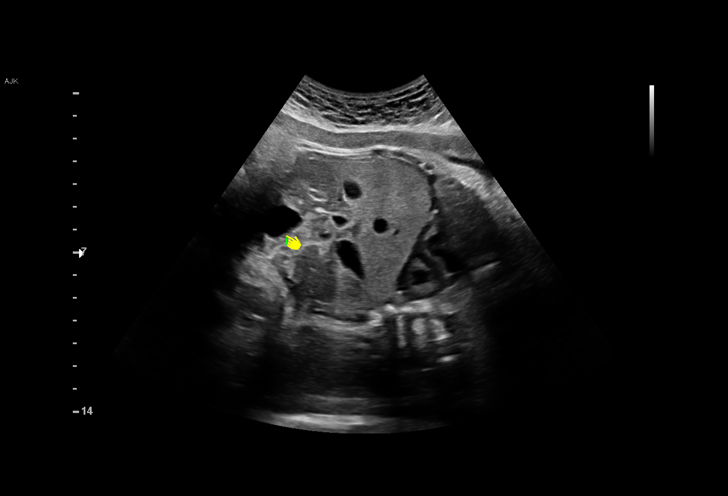
[im 18/28]
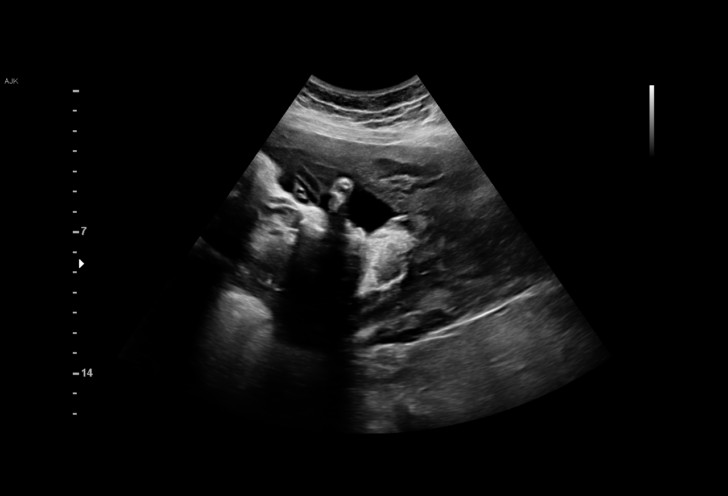
[im 20/28]
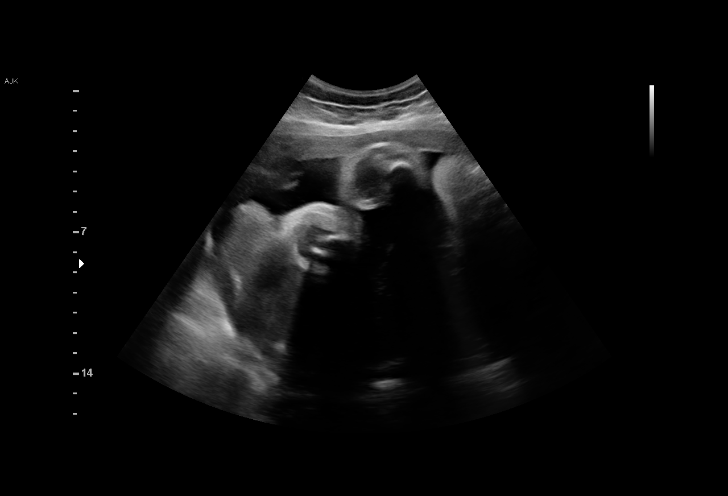
[im 22/28]
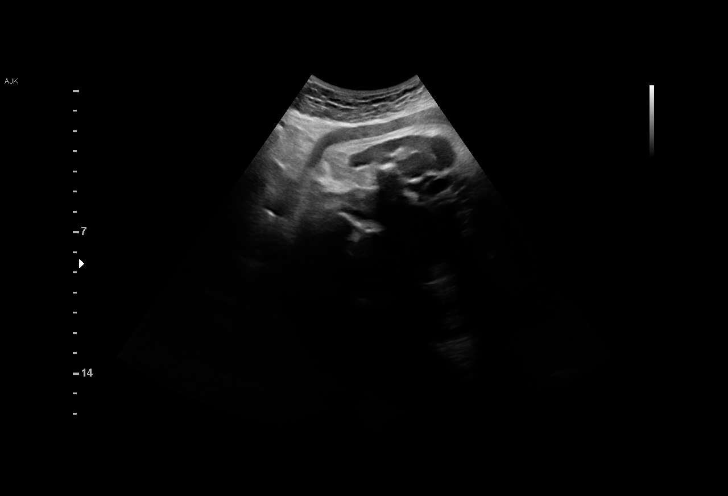
[im 24/28]
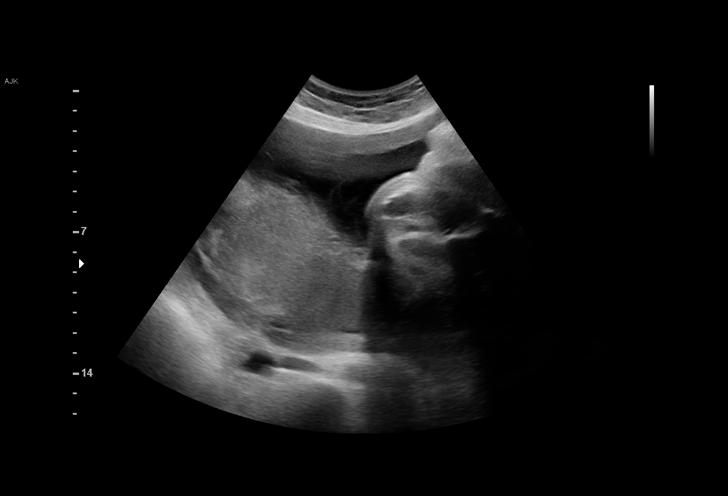
[im 26/28]
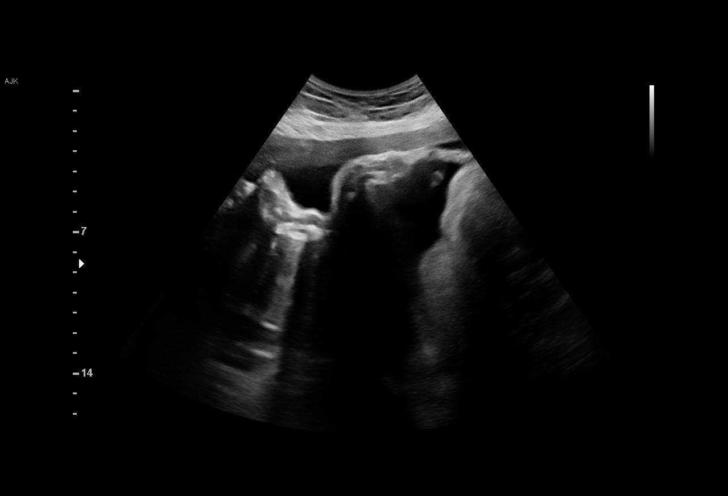
[im 28/28]
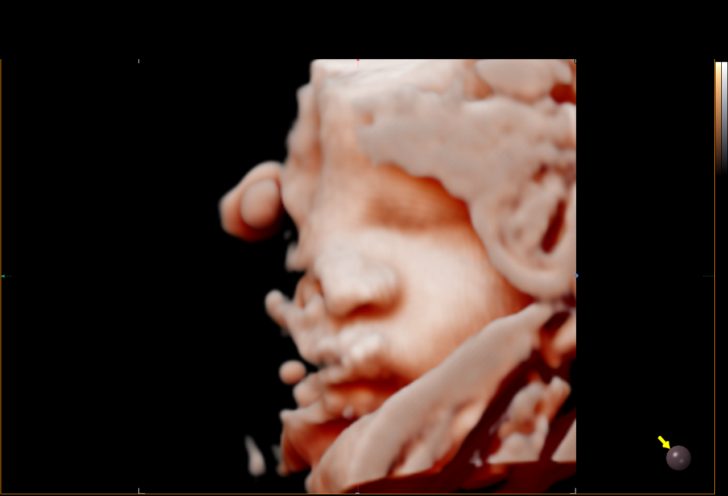

[15 of 28 positions shown; findings below may reference images not displayed]

STRESS                                            MOTA POMBO
 ----------------------------------------------------------------------

 ----------------------------------------------------------------------
Indications

  NON COMPLIANT Gestational diabetes in
  pregnancy, controlled by oral hypoglycemic
  drugs (metformin - only taking "when she
  feels like she needs to")
  Obesity complicating pregnancy, second
  trimester
  Poor obstetric history: Previous preterm
  delivery, antepartum (36 weeks)
  37 weeks gestation of pregnancy
 ----------------------------------------------------------------------
Vital Signs

                                                Height:        5'10"
Fetal Evaluation

 Num Of Fetuses:         1
 Fetal Heart Rate(bpm):  145
 Cardiac Activity:       Observed
 Presentation:           Cephalic
 Placenta:               Posterior

 Amniotic Fluid
 AFI FV:      Within normal limits

 AFI Sum(cm)     %Tile       Largest Pocket(cm)
 11.46           36
 RUQ(cm)       RLQ(cm)       LUQ(cm)        LLQ(cm)


 Comment:    Stomach, bladder, diaphragm seen.
Biophysical Evaluation

 Amniotic F.V:   Within normal limits       F. Tone:        Observed
 F. Movement:    Observed                   Score:          [DATE]
 F. Breathing:   Observed
OB History

 Gravidity:    5         Term:   2        Prem:   1        SAB:   1
 TOP:          0       Ectopic:  0        Living: 3
Gestational Age

 LMP:           39w 3d        Date:  11/20/17                 EDD:   08/27/18
 Best:          37w 6d     Det. By:  Early Ultrasound         EDD:   09/07/18
                                     (01/20/18)
Impression

 Amniotic fluid is normal and good fetal activity is seen.
 Antenatal testing is reassuring. BPP [DATE].
 Patient will be undergoing induction of labor next week.
Recommendations

 No follow-up appointments were ma[REDACTED]

## 2020-03-02 ENCOUNTER — Ambulatory Visit: Payer: Medicaid Other

## 2020-03-27 ENCOUNTER — Ambulatory Visit: Payer: Medicaid Other | Admitting: Obstetrics

## 2020-11-08 ENCOUNTER — Other Ambulatory Visit: Payer: Self-pay

## 2020-11-08 ENCOUNTER — Encounter: Payer: Self-pay | Admitting: Obstetrics

## 2020-11-08 ENCOUNTER — Ambulatory Visit (INDEPENDENT_AMBULATORY_CARE_PROVIDER_SITE_OTHER): Payer: BC Managed Care – PPO | Admitting: Obstetrics

## 2020-11-08 ENCOUNTER — Other Ambulatory Visit (HOSPITAL_COMMUNITY)
Admission: RE | Admit: 2020-11-08 | Discharge: 2020-11-08 | Disposition: A | Discharge status: Home / Self Care | Payer: BC Managed Care – PPO | Source: Ambulatory Visit | Attending: Obstetrics | Admitting: Obstetrics

## 2020-11-08 VITALS — BP 116/73 | HR 73 | Ht 71.0 in | Wt 230.0 lb

## 2020-11-08 DIAGNOSIS — N898 Other specified noninflammatory disorders of vagina: Secondary | ICD-10-CM | POA: Diagnosis not present

## 2020-11-08 DIAGNOSIS — Z113 Encounter for screening for infections with a predominantly sexual mode of transmission: Secondary | ICD-10-CM

## 2020-11-08 DIAGNOSIS — Z9851 Tubal ligation status: Secondary | ICD-10-CM

## 2020-11-08 DIAGNOSIS — Z01419 Encounter for gynecological examination (general) (routine) without abnormal findings: Secondary | ICD-10-CM

## 2020-11-08 DIAGNOSIS — N9989 Other postprocedural complications and disorders of genitourinary system: Secondary | ICD-10-CM

## 2020-11-08 DIAGNOSIS — Z124 Encounter for screening for malignant neoplasm of cervix: Secondary | ICD-10-CM | POA: Insufficient documentation

## 2020-11-08 DIAGNOSIS — E669 Obesity, unspecified: Secondary | ICD-10-CM

## 2020-11-08 NOTE — Progress Notes (Signed)
Patient presents for Annual   LMP:10/30/20 STD Screening : Desires Full Panel Last pap:09/27/2018 WNL   CC: Painful periods and heavy cycles also notes cramping w/ intercourse.

## 2020-11-08 NOTE — Progress Notes (Signed)
Subjective:        Holly Brown is a 35 y.o. female here for a routine exam.  Current complaints: Heavy and painful periods after tubal sterilization.  Also has pain with intercourse.    Personal health questionnaire:  Is patient Ashkenazi Jewish, have a family history of breast and/or ovarian cancer: no Is there a family history of uterine cancer diagnosed at age < 9, gastrointestinal cancer, urinary tract cancer, family member who is a Personnel officer syndrome-associated carrier: yes Is the patient overweight and hypertensive, family history of diabetes, personal history of gestational diabetes, preeclampsia or PCOS: no Is patient over 29, have PCOS,  family history of premature CHD under age 2, diabetes, smoke, have hypertension or peripheral artery disease:  no At any time, has a partner hit, kicked or otherwise hurt or frightened you?: no Over the past 2 weeks, have you felt down, depressed or hopeless?: no Over the past 2 weeks, have you felt little interest or pleasure in doing things?:no   Gynecologic History Patient's last menstrual period was 10/30/2020 (approximate). Contraception: tubal ligation Last Pap: 2020. Results were: normal Last mammogram: n/a. Results were: n/a  Obstetric History OB History  Gravida Para Term Preterm AB Living  5 4 3 1 1 4   SAB IAB Ectopic Multiple Live Births  1     0 4    # Outcome Date GA Lbr Len/2nd Weight Sex Delivery Anes PTL Lv  5 Term 08/31/18 [redacted]w[redacted]d 03:39 6 lb 15.6 oz (3.164 kg) F Vag-Spont None  LIV  4 Term 11/08/12 [redacted]w[redacted]d  7 lb 12.6 oz (3.532 kg) M Vag-Spont None  LIV     Birth Comments: No complications during pregnancy or delivery.  No special care nursery, went home with mother after 2 days.  3 Preterm 08/04/09 [redacted]w[redacted]d  7 lb 6 oz (3.345 kg) M Vag-Spont  Y LIV  2 Term 10/02/08    F Vag-Spont   LIV  1 SAB             Past Medical History:  Diagnosis Date   Gestational diabetes    metformin   History of UTI    Migraines     Seizures (HCC)    Jan.2017   Vaginal Pap smear, abnormal     Past Surgical History:  Procedure Laterality Date   DILATION AND CURETTAGE OF UTERUS     LAPAROSCOPIC TUBAL LIGATION Bilateral 12/08/2018   Procedure: LAPAROSCOPIC TUBAL LIGATION;  Surgeon: 12/10/2018, MD;  Location: [facility];  Service: Gynecology;  Laterality: Bilateral;   WISDOM TOOTH EXTRACTION  2011     Current Outpatient Medications:    cyclobenzaprine (FLEXERIL) 10 MG tablet, Take 0.5-1 tablets (5-10 mg total) by mouth 3 (three) times daily as needed for muscle spasms. (Patient not taking: Reported on 12/23/2018), Disp: 20 tablet, Rfl: 0   diclofenac (VOLTAREN) 75 MG EC tablet, Take 1 tablet (75 mg total) by mouth 2 (two) times daily., Disp: 14 tablet, Rfl: 0   ibuprofen (ADVIL) 600 MG tablet, Take 1 tablet (600 mg total) by mouth every 6 (six) hours as needed. (Patient not taking: Reported on 12/23/2018), Disp: 60 tablet, Rfl: 3   ketorolac (TORADOL) 10 MG tablet, Take 1 tablet (10 mg total) by mouth every 6 (six) hours as needed., Disp: 20 tablet, Rfl: 0   medroxyPROGESTERone (DEPO-PROVERA) 150 MG/ML injection, Inject 1 mL (150 mg total) into the muscle every 3 (three) months. (Patient not taking: Reported on 12/23/2018), Disp: 1  mL, Rfl: 3   oxyCODONE-acetaminophen (PERCOCET/ROXICET) 5-325 MG tablet, Take 1 tablet by mouth every 6 (six) hours as needed., Disp: 15 tablet, Rfl: 0 No Known Allergies  Social History   Tobacco Use   Smoking status: Former    Packs/day: 0.25    Types: Cigarettes    Start date: 12/21/2011   Smokeless tobacco: Never  Substance Use Topics   Alcohol use: Not Currently    Alcohol/week: 0.0 standard drinks    Comment: ocassionally     Family History  Problem Relation Age of Onset   Colon cancer Father    Hypertension Maternal Grandmother    Diabetes Maternal Grandmother    Hypertension Paternal Grandmother    Diabetes Mother       Review of Systems  Constitutional:  negative for fatigue and weight loss Respiratory: negative for cough and wheezing Cardiovascular: negative for chest pain, fatigue and palpitations Gastrointestinal: negative for abdominal pain and change in bowel habits Musculoskeletal:negative for myalgias Neurological: negative for gait problems and tremors Behavioral/Psych: negative for abusive relationship, depression Endocrine: negative for temperature intolerance    Genitourinary:positive for abnormally heavy and painful menstrual periods and vaginal discharge, genital lesions, hot flashes, sexual problems  Integument/breast: negative for breast lump, breast tenderness, nipple discharge and skin lesion(s)    Objective:       BP 116/73   Pulse 73   Ht 5\' 11"  (1.803 m)   Wt 230 lb (104.3 kg)   LMP 10/30/2020 (Approximate)   BMI 32.08 kg/m  General:   Alert and no distress  Skin:   no rash or abnormalities  Lungs:   clear to auscultation bilaterally  Heart:   regular rate and rhythm, S1, S2 normal, no murmur, click, rub or gallop  Breasts:   normal without suspicious masses, skin or nipple changes or axillary nodes  Abdomen:  normal findings: no organomegaly, soft, non-tender and no hernia  Pelvis:  External genitalia: normal general appearance Urinary system: urethral meatus normal and bladder without fullness, nontender Vaginal: normal without tenderness, induration or masses Cervix: normal appearance Adnexa: normal bimanual exam Uterus: anteverted and non-tender, normal size   Lab Review Urine pregnancy test Labs reviewed yes Radiologic studies reviewed no  I have spent a total of 20 minutes of face-to-face time, excluding clinical staff time, reviewing notes and preparing to see patient, ordering tests and/or medications, and counseling the patient.   Assessment:    1. 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 Vaginal discharge Rx: - Cervicovaginal ancillary only( [facility])  4. Screening for STD (sexually transmitted disease) Rx: - Hepatitis B surface antigen - Hepatitis C antibody - HIV Antibody (routine testing w rflx) - RPR   5. Obesity (BMI 30.0-34.9) - weight loss with the aid of dietary changes, exercise and behavioral modification recommended    Plan:    Education reviewed: calcium supplements, depression evaluation, low fat, low cholesterol diet, safe sex/STD prevention, self breast exams, and weight bearing exercise. Follow up in: 1 year.    Orders Placed This Encounter  Procedures   Hepatitis B surface antigen   Hepatitis C antibody   HIV Antibody (routine testing w rflx)   RPR      08-23-1992, MD 11/08/2020 12:30 PM

## 2020-11-09 LAB — CERVICOVAGINAL ANCILLARY ONLY
Bacterial Vaginitis (gardnerella): POSITIVE — AB
Candida Glabrata: NEGATIVE
Candida Vaginitis: NEGATIVE
Chlamydia: NEGATIVE
Comment: NEGATIVE
Comment: NEGATIVE
Comment: NEGATIVE
Comment: NEGATIVE
Comment: NEGATIVE
Comment: NORMAL
Neisseria Gonorrhea: NEGATIVE
Trichomonas: POSITIVE — AB

## 2020-11-09 LAB — HIV ANTIBODY (ROUTINE TESTING W REFLEX): HIV Screen 4th Generation wRfx: NONREACTIVE

## 2020-11-09 LAB — HEPATITIS C ANTIBODY: Hep C Virus Ab: <0.1 s/co ratio (ref 0.0–0.9)

## 2020-11-09 LAB — HEPATITIS B SURFACE ANTIGEN: Hepatitis B Surface Ag: NEGATIVE

## 2020-11-09 LAB — RPR: RPR Ser Ql: NONREACTIVE

## 2020-11-12 ENCOUNTER — Other Ambulatory Visit: Payer: Self-pay

## 2020-11-12 ENCOUNTER — Other Ambulatory Visit: Payer: Self-pay | Admitting: Obstetrics

## 2020-11-12 ENCOUNTER — Ambulatory Visit (HOSPITAL_COMMUNITY)
Admission: EM | Admit: 2020-11-12 | Discharge: 2020-11-12 | Disposition: A | Discharge status: Home / Self Care | Payer: BC Managed Care – PPO | Attending: Physician Assistant | Admitting: Physician Assistant

## 2020-11-12 ENCOUNTER — Encounter (HOSPITAL_COMMUNITY): Payer: Self-pay

## 2020-11-12 ENCOUNTER — Telehealth: Payer: Self-pay

## 2020-11-12 DIAGNOSIS — N760 Acute vaginitis: Secondary | ICD-10-CM

## 2020-11-12 DIAGNOSIS — A599 Trichomoniasis, unspecified: Secondary | ICD-10-CM

## 2020-11-12 DIAGNOSIS — S46811A Strain of other muscles, fascia and tendons at shoulder and upper arm level, right arm, initial encounter: Secondary | ICD-10-CM | POA: Diagnosis not present

## 2020-11-12 DIAGNOSIS — M25511 Pain in right shoulder: Secondary | ICD-10-CM

## 2020-11-12 DIAGNOSIS — M542 Cervicalgia: Secondary | ICD-10-CM

## 2020-11-12 DIAGNOSIS — B9689 Other specified bacterial agents as the cause of diseases classified elsewhere: Secondary | ICD-10-CM

## 2020-11-12 MED ORDER — IBUPROFEN 600 MG PO TABS: 600.0000 mg | ORAL_TABLET | Freq: Three times a day (TID) | ORAL | 0 refills | Status: DC | PRN

## 2020-11-12 MED ORDER — KETOROLAC TROMETHAMINE 30 MG/ML IJ SOLN
30.0000 mg | Freq: Once | INTRAMUSCULAR | Status: AC
  Administered 2020-11-12: 30 mg via INTRAMUSCULAR

## 2020-11-12 MED ORDER — KETOROLAC TROMETHAMINE 30 MG/ML IJ SOLN
INTRAMUSCULAR | Status: AC
  Filled 2020-11-12: qty 1

## 2020-11-12 MED ORDER — METRONIDAZOLE 500 MG PO TABS: 500.0000 mg | ORAL_TABLET | Freq: Two times a day (BID) | ORAL | 2 refills | Status: DC

## 2020-11-12 MED ORDER — METHOCARBAMOL 500 MG PO TABS: 500.0000 mg | ORAL_TABLET | Freq: Three times a day (TID) | ORAL | 0 refills | Status: DC | PRN

## 2020-11-12 NOTE — Telephone Encounter (Signed)
-----   Message from Brock Bad, MD sent at 11/12/2020  6:11 AM EDT ----- Flagyl Rx for Trichomonas and BV

## 2020-11-12 NOTE — ED Triage Notes (Signed)
Pt in with c/o right shoulder pain that started Thursday after she was moving a lot of boxes  States she has been taking ibuprofen for pain relief

## 2020-11-12 NOTE — Telephone Encounter (Signed)
Pt made aware of results Pt states she saw results in Mychart Rx sent Pt advised to make TOC appt.

## 2020-11-12 NOTE — ED Provider Notes (Signed)
[facility]    CSN: [id number] Arrival date & time: 11/12/20  1104      History   Chief Complaint Chief Complaint  Patient presents with   right shoulder pain    HPI Holly Brown is a 35 y.o. female.   Patient presents today with a 5-day history of right shoulder/neck pain.  Reports that she was moving boxes when she lifted something heavy and felt a pain in her right trapezius.  She has had ongoing pain since that time.  Pain is rated 10 on a 0-10 pain scale, localized to right trapezius with radiation into shoulder and upper arm, described as sharp, worse with certain movements, no alleviating factors identified.  She has taken ibuprofen with improvement but not resolution of symptoms.  She is right-handed.  She denies previous surgery or injury to right shoulder/arm.  Denies any weakness, numbness, paresthesias.  She is having difficulty with daily activities as result of symptoms.  She is confident that she is not pregnant.   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   Gestational diabetes mellitus (GDM) in third trimester controlled on oral hypoglycemic drug 08/03/2018   Urinary tract infection affecting care of mother in first trimester, antepartum 03/07/2018   Supervision of high risk pregnancy, antepartum 02/01/2018   History of preterm delivery, currently pregnant 02/01/2018   Cervical high risk HPV (human papillomavirus) test positive 05/18/2017   Gonorrhea affecting pregnancy in first trimester 05/15/2017   Family history of colon cancer 10/10/2015   Syncope 07/04/2014   Heart palpitations 07/04/2014     Past Surgical History:  Procedure Laterality Date   DILATION AND CURETTAGE OF UTERUS     LAPAROSCOPIC TUBAL LIGATION Bilateral 12/08/2018   Procedure: LAPAROSCOPIC TUBAL LIGATION;  Surgeon: Catalina Antigua, MD;  Location: [facility];  Service: Gynecology;  Laterality: Bilateral;   WISDOM TOOTH EXTRACTION  2011    OB History     Gravida  5   Para  4   Term  3   Preterm  1   AB  1   Living  4      SAB  1   IAB      Ectopic      Multiple  0   Live Births  4            Home Medications    Prior to Admission medications   Medication Sig Start Date End Date Taking? Authorizing Provider  methocarbamol (ROBAXIN) 500 MG tablet Take 1 tablet (500 mg total) by mouth every 8 (eight) hours as needed for muscle spasms. 11/12/20  Yes ,  K, PA-C  ibuprofen (ADVIL) 600 MG tablet Take 1 tablet (600 mg total) by mouth every 8 (eight) hours as needed. 11/12/20   , Noberto Retort, PA-C  medroxyPROGESTERone (DEPO-PROVERA) 150 MG/ML injection Inject 1 mL (150 mg total) into the muscle every 3 (three) months. Patient not taking: Reported on 12/23/2018 12/14/18   Constant, Peggy, MD  metroNIDAZOLE (FLAGYL) 500 MG tablet Take 1 tablet (500 mg total) by mouth 2 (two) times daily. 11/12/20  Brock BadHarper, Charles A, MD    Family History Family History  Problem Relation Age of Onset   Colon cancer Father    Hypertension Maternal Grandmother    Diabetes Maternal Grandmother    Hypertension Paternal Grandmother    Diabetes Mother     Social History Social History   Tobacco Use   Smoking status: Former    Packs/day: 0.25    Types: Cigarettes    Start date: 12/21/2011   Smokeless tobacco: Never  Vaping Use   Vaping Use: Never used  Substance Use Topics   Alcohol use: Not Currently    Alcohol/week: 0.0 standard drinks    Comment: ocassionally    Drug use: No     Allergies   Patient has no known allergies.   Review of Systems Review of Systems   Constitutional:  Positive for activity change. Negative for appetite change, fatigue and fever.  Respiratory:  Negative for cough and shortness of breath.   Cardiovascular:  Negative for chest pain.  Gastrointestinal:  Negative for abdominal pain, diarrhea, nausea and vomiting.  Musculoskeletal:  Positive for arthralgias and myalgias.  Neurological:  Negative for dizziness, weakness, light-headedness, numbness and headaches.    Physical Exam Triage Vital Signs ED Triage Vitals  Enc Vitals Group     BP 11/12/20 1401 (!) 141/76     Pulse Rate 11/12/20 1401 73     Resp 11/12/20 1401 18     Temp 11/12/20 1401 98.7 F (37.1 C)     Temp Source 11/12/20 1401 Oral     SpO2 11/12/20 1401 98 %     Weight --      Height --      Head Circumference --      Peak Flow --      Pain Score 11/12/20 1406 10     Pain Loc --      Pain Edu? --      Excl. in GC? --    No data found.  Updated Vital Signs BP (!) 141/76 (BP Location: Left Arm)   Pulse 73   Temp 98.7 F (37.1 C) (Oral)   Resp 18   LMP 10/30/2020 (Approximate)   SpO2 98%   Visual Acuity Right Eye Distance:   Left Eye Distance:   Bilateral Distance:    Right Eye Near:   Left Eye Near:    Bilateral Near:     Physical Exam Vitals reviewed.  Constitutional:      General: She is awake. She is not in acute distress.    Appearance: Normal appearance. She is not ill-appearing.     Comments: Very pleasant female appears stated age in no acute distress sitting comfortably in exam room  HENT:     Head: Normocephalic and atraumatic.  Cardiovascular:     Rate and Rhythm: Normal rate and regular rhythm.     Pulses:          Radial pulses are 2+ on the right side and 2+ on the left side.     Heart sounds: Normal heart sounds, S1 normal and S2 normal. No murmur heard. Pulmonary:     Effort: Pulmonary effort is normal.     Breath sounds: Normal breath sounds. No wheezing, rhonchi or rales.     Comments: Clear to auscultation  bilaterally Abdominal:     Palpations: Abdomen is soft.     Tenderness: There is no abdominal tenderness.  Musculoskeletal:     Right shoulder: No swelling, deformity, tenderness or  bony tenderness. Decreased range of motion.     Cervical back: Tenderness present. No spasms or bony tenderness.     Thoracic back: No tenderness or bony tenderness.     Lumbar back: No tenderness or bony tenderness.     Comments: Right shoulder: Decreased range of motion with overhead flexion or abduction secondary to pain.  Strength 5/5 at shoulder and elbow.  Hands neurovascularly intact.  Patient unable to perform Apley scratch secondary to pain.  Negative drop arm and empty can.  Neck: No pain percussion of vertebrae.  Tenderness palpation along right trapezius.  Full active range of motion.  Psychiatric:        Behavior: Behavior is cooperative.     UC Treatments / Results  Labs (all labs ordered are listed, but only abnormal results are displayed) Labs Reviewed - No data to display  EKG   Radiology No results found.  Procedures Procedures (including critical care time)  Medications Ordered in UC Medications  ketorolac (TORADOL) 30 MG/ML injection 30 mg (has no administration in time range)    Initial Impression / Assessment and Plan / UC Course  I have reviewed the triage vital signs and the nursing notes.  Pertinent labs & imaging results that were available during my care of the patient were reviewed by me and considered in my medical decision making (see chart for details).      No indication for x-ray given no bony tenderness and lack of traumatic injury.  Strongly suspect trapezius strain as etiology of symptoms.  Patient was given Toradol in clinic and instructed to avoid NSAIDs for 24 hours.  She was prescribed ibuprofen 600 mg to be given 24 hours after Toradol injection to help manage pain and inflammation with instruction not to take additional NSAIDs with this medication due  to risk of GI bleeding.  She was prescribed Robaxin to be taken up to 3 times a day as needed with instruction not to drive or drink alcohol with this medication as drowsiness is a common side effect.  Recommended she use heat, rest, stretch for additional symptom relief.  She was given contact information for local orthopedic group should symptoms persist.  Discussed alarm symptoms that warrant emergent evaluation.  Strict return precautions given to which patient expressed understanding.  Final Clinical Impressions(s) / UC Diagnoses   Final diagnoses:  Trapezius strain, right, initial encounter  Neck pain  Acute pain of right shoulder     Discharge Instructions      I believe that you have a strain of your trapezius muscle.  We have given you Toradol today so please do not take any NSAIDs including aspirin, ibuprofen/Advil, naproxen/Aleve for 24 hours.  I did call in ibuprofen 600 mg to be taken up to 3 times a day as needed but this should be started in 24 hours as discussed.  Do not take additional NSAIDs with ibuprofen as it can cause stomach bleeding.  Use Robaxin (methocarbamol) up to 3 times a day.  This will make you sleepy do not drive or drink alcohol while taking it.  Use heat, rest, stretch for additional symptom relief.  If symptoms or not improving.  Follow-up with orthopedics as we discussed.  If you have any sudden worsening of symptoms you need to be reevaluated immediately.     ED Prescriptions     Medication Sig Dispense Auth. Provider   ibuprofen (ADVIL) 600 MG tablet Take 1 tablet (600 mg total) by mouth every 8 (eight)  hours as needed. 21 tablet ,  K, PA-C   methocarbamol (ROBAXIN) 500 MG tablet Take 1 tablet (500 mg total) by mouth every 8 (eight) hours as needed for muscle spasms. 21 tablet , Noberto Retort, PA-C      PDMP not reviewed this encounter.   Jeani Hawking, PA-C 11/12/20 1434

## 2020-11-12 NOTE — Discharge Instructions (Addendum)
I believe that you have a strain of your trapezius muscle.  We have given you Toradol today so please do not take any NSAIDs including aspirin, ibuprofen/Advil, naproxen/Aleve for 24 hours.  I did call in ibuprofen 600 mg to be taken up to 3 times a day as needed but this should be started in 24 hours as discussed.  Do not take additional NSAIDs with ibuprofen as it can cause stomach bleeding.  Use Robaxin (methocarbamol) up to 3 times a day.  This will make you sleepy do not drive or drink alcohol while taking it.  Use heat, rest, stretch for additional symptom relief.  If symptoms or not improving.  Follow-up with orthopedics as we discussed.  If you have any sudden worsening of symptoms you need to be reevaluated immediately.

## 2020-11-13 LAB — CYTOLOGY - PAP
Comment: NEGATIVE
Comment: NEGATIVE
Diagnosis: HIGH — AB
HPV 16: POSITIVE — AB
HPV 18 / 45: NEGATIVE
High risk HPV: POSITIVE — AB

## 2020-11-23 DIAGNOSIS — M24811 Other specific joint derangements of right shoulder, not elsewhere classified: Secondary | ICD-10-CM | POA: Diagnosis not present

## 2020-12-12 ENCOUNTER — Encounter: Payer: BC Managed Care – PPO | Admitting: Obstetrics

## 2020-12-12 ENCOUNTER — Ambulatory Visit: Payer: BC Managed Care – PPO

## 2021-01-08 ENCOUNTER — Telehealth: Payer: Self-pay | Admitting: *Deleted

## 2021-01-08 ENCOUNTER — Encounter: Payer: BC Managed Care – PPO | Admitting: Obstetrics

## 2021-01-08 NOTE — Telephone Encounter (Signed)
TC to patient regarding missed appt for colposcopy. Left VM stating the importance of rescheduling the appt and encouraging call back to speak with Dr. Clearance Coots per his request.

## 2021-05-21 ENCOUNTER — Other Ambulatory Visit: Payer: Self-pay

## 2021-05-21 ENCOUNTER — Ambulatory Visit (INDEPENDENT_AMBULATORY_CARE_PROVIDER_SITE_OTHER): Payer: Medicaid Other | Admitting: Obstetrics

## 2021-05-21 ENCOUNTER — Encounter: Payer: Self-pay | Admitting: Obstetrics

## 2021-05-21 ENCOUNTER — Other Ambulatory Visit (HOSPITAL_COMMUNITY)
Admission: RE | Admit: 2021-05-21 | Discharge: 2021-05-21 | Disposition: A | Discharge status: Home / Self Care | Payer: No Typology Code available for payment source | Source: Ambulatory Visit | Attending: Obstetrics | Admitting: Obstetrics

## 2021-05-21 VITALS — BP 122/71 | HR 86 | Ht 70.5 in | Wt 217.5 lb

## 2021-05-21 DIAGNOSIS — Z01812 Encounter for preprocedural laboratory examination: Secondary | ICD-10-CM | POA: Diagnosis not present

## 2021-05-21 DIAGNOSIS — R87613 High grade squamous intraepithelial lesion on cytologic smear of cervix (HGSIL): Secondary | ICD-10-CM | POA: Insufficient documentation

## 2021-05-21 LAB — POCT URINE PREGNANCY: Preg Test, Ur: NEGATIVE

## 2021-05-21 NOTE — Progress Notes (Signed)
Pt is in the office for colpo, Abnormal pap on 11-08-20 Hx of BTL

## 2021-05-21 NOTE — Progress Notes (Signed)
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 The prior pap showed no abnormalities.  Prior cervical/vaginal disease: normal exam without visible pathology. Prior cervical treatment: no treatment.  Procedure Details  The risks and benefits of the procedure and Written informed consent obtained.  A time-out was performed confirming the patient, procedure and allergy status  Speculum placed in vagina and excellent visualization of cervix achieved, cervix swabbed x 3 with acetic acid solution.  Findings: Cervix: acetowhite lesion(s) noted at 6, 9, 12 o'clock; SCJ visualized 360 degrees without lesions, endocervical curettage performed, cervical biopsies taken at 6, 9, 12 o'clock, specimen labelled and sent to pathology, and hemostasis achieved with silver nitrate.   Vaginal inspection: normal without visible lesions. Vulvar colposcopy: vulvar colposcopy not performed.   Physical Exam   Specimens: ECC and Cervical Biopsies  Complications: none.  Plan: Specimens labelled and sent to Pathology. Will base further treatment on Pathology findings. Post biopsy instructions given to patient. Return to discuss Pathology results in 2 weeks.    Brock Bad, MD 05/21/2021 11:57 AM

## 2021-05-23 LAB — SURGICAL PATHOLOGY

## 2021-05-28 ENCOUNTER — Encounter: Payer: Self-pay | Admitting: *Deleted

## 2021-05-28 NOTE — Progress Notes (Signed)
Discussed with Dr. Jodi Mourning. Recommends patient come in to the office for consult with Dr. Rip Harbour to discuss options for LEEP in the office verses outpatient surgery. TC to notify patient. Patient is agreeable, call transferred to front office for scheduling.

## 2021-05-28 NOTE — Progress Notes (Signed)
TC to patient to inform of abnormal colpo results and need for LEEP. Patient states she did not tolerate colpo well and wants anesthesia for LEEP. Will discuss with Dr. Clearance Coots and Dr. Alysia Penna. MyChart message with education on LEEP sent. Patient advised we would call back for scheduling.

## 2021-06-04 ENCOUNTER — Telehealth: Payer: No Typology Code available for payment source | Admitting: Obstetrics

## 2021-06-12 ENCOUNTER — Telehealth: Payer: Medicaid Other | Admitting: Obstetrics and Gynecology

## 2021-06-18 ENCOUNTER — Telehealth (INDEPENDENT_AMBULATORY_CARE_PROVIDER_SITE_OTHER): Payer: Medicaid Other | Admitting: Obstetrics

## 2021-06-18 ENCOUNTER — Encounter: Payer: Self-pay | Admitting: Obstetrics

## 2021-06-18 ENCOUNTER — Other Ambulatory Visit: Payer: Self-pay

## 2021-06-18 DIAGNOSIS — D069 Carcinoma in situ of cervix, unspecified: Secondary | ICD-10-CM

## 2021-06-18 NOTE — Progress Notes (Signed)
GYNECOLOGY VIRTUAL VISIT ENCOUNTER NOTE  Provider location: Center for [facility]   Patient location: Home  I connected with Holly Brown on 06/18/21 at  8:55 AM EST by MyChart Video Encounter and verified that I am speaking with the correct person using two identifiers.   I discussed the limitations, risks, security and privacy concerns of performing an evaluation and management service virtually and the availability of in person appointments. I also discussed with the patient that there may be a patient responsible charge related to this service. The patient expressed understanding and agreed to proceed.   History:  Holly Brown is a 36 y.o. [id number] female being evaluated today for results of colposcopy. She denies any abnormal vaginal discharge, bleeding, pelvic pain or other concerns.       Past Medical History:  Diagnosis Date   Gestational diabetes    metformin   History of UTI    Migraines    Seizures (HCC)    Jan.2017   Vaginal Pap smear, abnormal    Past Surgical History:  Procedure Laterality Date   DILATION AND CURETTAGE OF UTERUS     LAPAROSCOPIC TUBAL LIGATION Bilateral 12/08/2018   Procedure: LAPAROSCOPIC TUBAL LIGATION;  Surgeon: Catalina Antigua, MD;  Location: [facility];  Service: Gynecology;  Laterality: Bilateral;   WISDOM TOOTH EXTRACTION  2011   The following portions of the patient's history were reviewed and updated as appropriate: allergies, current medications, past family history, past medical history, past social history, past surgical history and problem list.   Health Maintenance:  HGSIL pap and positive HRHPV on 11-08-2021.  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  Physical Exam:   General:  Alert, oriented and cooperative. Patient appears to be in no acute distress.  Mental Status: Normal mood and  affect. Normal behavior. Normal judgment and thought content.   Respiratory: Normal respiratory effort, no problems with respiration noted  Rest of physical exam deferred due to type of encounter  Labs and Imaging Results Surgical pathology( [place]) (Order [id number]) MyChart Results Release  MyChart Status: Active  Results Release   Surgical pathology( [place]) Order: [id number] Status: Edited Result - FINAL    Visible to patient: Yes (seen)    Next appt: 07/23/2021 at 10:55 AM in Obstetrics and Gynecology Hermina Staggers, MD)    Dx: HGSIL (high grade squamous intraepith...    3 Result Notes    Component 4 wk ago  SURGICAL PATHOLOGY SURGICAL PATHOLOGY  CASE: [id number]  PATIENT: Holly Brown  Surgical Pathology Report      Clinical History: HGSIL (cm)    FINAL MICROSCOPIC DIAGNOSIS:   A. 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  Volume: 1.7 x 1.3 x 0.2 cm.   Specimen B: Received in formalin are pink-white soft tissue fragments  that are submitted in toto. Number: 3.  Size: 0.3 to 0.5 cm.  Blocks: 1   SW 05/22/2021    Final Diagnosis performed by Manning Charity, MD.   Electronically signed  05/23/2021  Technical and /  or Professional components performed at [employer]. [facility], [address], [place], [place] [address].   Immunohistochemistry Technical component (if applicable) was performed  at [facility] [address], [address],  [place], [place] [address].   IMMUNOHISTOCHEMISTRY DISCLAIMER (if applicable):  Some of these immunohistochemical stains may have been  developed and the  performance characteristics determine by [facility]. Some  may not have been cleared or approved by the U.S. Food and Drug  Administration. The FDA has determined that such clearance or approval  is not necessary. This test is used for clinical purposes. It should not  be regarded as investigational or for research. This laboratory is  certified under the Clinical Laboratory Improvement Amendments of 1988  (CLIA-88) as qualified to perform high complexity clinical laboratory  testing.  The controls stained appropriately.   Resulting Agency [facility]       Assessment and Plan:     1. 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 The patient was provided an opportunity to ask questions and all were answered. The patient agreed with the plan and demonstrated an understanding of the instructions.   The patient was advised to call back or seek an in-person evaluation/go to the ED if the symptoms worsen or if the condition fails to improve as anticipated.  I have spent a total of 15 minutes non-face-to-face time, excluding clinical staff time, reviewing notes and preparing to see patient, ordering tests and/or medications, and counseling the patient.    Coral Ceo, MD Center for [facility], [facility], [facility] 06/18/21

## 2021-07-22 ENCOUNTER — Other Ambulatory Visit: Payer: Self-pay | Admitting: Obstetrics and Gynecology

## 2021-07-22 MED ORDER — LORAZEPAM 1 MG PO TABS: 1.0000 mg | ORAL_TABLET | Freq: Three times a day (TID) | ORAL | 0 refills | Status: DC

## 2021-07-22 NOTE — Progress Notes (Signed)
Ativan Rx sent for pre procedure. ?

## 2021-07-23 ENCOUNTER — Encounter: Payer: Medicaid Other | Admitting: Obstetrics and Gynecology

## 2021-07-30 ENCOUNTER — Encounter: Payer: Medicaid Other | Admitting: Obstetrics

## 2021-08-20 ENCOUNTER — Encounter: Payer: Medicaid Other | Admitting: Obstetrics and Gynecology

## 2021-09-12 ENCOUNTER — Telehealth: Payer: Self-pay | Admitting: Obstetrics

## 2021-09-12 MED ORDER — VALACYCLOVIR HCL 500 MG PO TABS: 500.0000 mg | ORAL_TABLET | Freq: Two times a day (BID) | ORAL | 0 refills | Status: AC

## 2021-09-12 NOTE — Telephone Encounter (Signed)
Patient called requesting Rx refill for herpes outbreak.

## 2021-09-23 ENCOUNTER — Emergency Department (HOSPITAL_BASED_OUTPATIENT_CLINIC_OR_DEPARTMENT_OTHER)
Admission: EM | Admit: 2021-09-23 | Discharge: 2021-09-23 | Disposition: A | Discharge status: Home / Self Care | Payer: Medicaid Other | Source: Home / Self Care | Attending: Emergency Medicine | Admitting: Emergency Medicine

## 2021-09-23 ENCOUNTER — Encounter (HOSPITAL_COMMUNITY): Payer: Self-pay | Admitting: *Deleted

## 2021-09-23 ENCOUNTER — Other Ambulatory Visit (HOSPITAL_BASED_OUTPATIENT_CLINIC_OR_DEPARTMENT_OTHER): Payer: Self-pay

## 2021-09-23 ENCOUNTER — Emergency Department (HOSPITAL_BASED_OUTPATIENT_CLINIC_OR_DEPARTMENT_OTHER): Payer: Medicaid Other

## 2021-09-23 ENCOUNTER — Encounter (HOSPITAL_BASED_OUTPATIENT_CLINIC_OR_DEPARTMENT_OTHER): Payer: Self-pay | Admitting: Emergency Medicine

## 2021-09-23 ENCOUNTER — Ambulatory Visit (HOSPITAL_COMMUNITY)
Admission: EM | Admit: 2021-09-23 | Discharge: 2021-09-23 | Disposition: A | Discharge status: Other Acute Inpatient Hospital | Payer: Medicaid Other | Attending: Family Medicine | Admitting: Family Medicine

## 2021-09-23 ENCOUNTER — Encounter (HOSPITAL_COMMUNITY): Payer: Self-pay

## 2021-09-23 ENCOUNTER — Other Ambulatory Visit: Payer: Self-pay

## 2021-09-23 ENCOUNTER — Emergency Department (HOSPITAL_COMMUNITY)
Admission: EM | Admit: 2021-09-23 | Discharge: 2021-09-23 | Discharge status: Against Medical Advice | Payer: Medicaid Other | Attending: Emergency Medicine | Admitting: Emergency Medicine

## 2021-09-23 DIAGNOSIS — K529 Noninfective gastroenteritis and colitis, unspecified: Secondary | ICD-10-CM | POA: Insufficient documentation

## 2021-09-23 DIAGNOSIS — R109 Unspecified abdominal pain: Secondary | ICD-10-CM | POA: Diagnosis not present

## 2021-09-23 DIAGNOSIS — Z5321 Procedure and treatment not carried out due to patient leaving prior to being seen by health care provider: Secondary | ICD-10-CM | POA: Diagnosis not present

## 2021-09-23 DIAGNOSIS — N3000 Acute cystitis without hematuria: Secondary | ICD-10-CM | POA: Insufficient documentation

## 2021-09-23 DIAGNOSIS — R112 Nausea with vomiting, unspecified: Secondary | ICD-10-CM

## 2021-09-23 DIAGNOSIS — E876 Hypokalemia: Secondary | ICD-10-CM | POA: Insufficient documentation

## 2021-09-23 DIAGNOSIS — R1110 Vomiting, unspecified: Secondary | ICD-10-CM | POA: Insufficient documentation

## 2021-09-23 DIAGNOSIS — R197 Diarrhea, unspecified: Secondary | ICD-10-CM | POA: Diagnosis not present

## 2021-09-23 DIAGNOSIS — R252 Cramp and spasm: Secondary | ICD-10-CM | POA: Insufficient documentation

## 2021-09-23 LAB — URINALYSIS, ROUTINE W REFLEX MICROSCOPIC
Bilirubin Urine: NEGATIVE
Glucose, UA: 500 mg/dL — AB
Hgb urine dipstick: NEGATIVE
Ketones, ur: >80 mg/dL — AB
Leukocytes,Ua: NEGATIVE
Nitrite: POSITIVE — AB
Protein, ur: 30 mg/dL — AB
Specific Gravity, Urine: >1.046 — ABNORMAL HIGH (ref 1.005–1.030)
pH: 8 (ref 5.0–8.0)

## 2021-09-23 LAB — COMPREHENSIVE METABOLIC PANEL
ALT: 26 U/L (ref 0–44)
AST: 23 U/L (ref 15–41)
Albumin: 4.2 g/dL (ref 3.5–5.0)
Alkaline Phosphatase: 62 U/L (ref 38–126)
Anion gap: 13 (ref 5–15)
BUN: 10 mg/dL (ref 6–20)
CO2: 16 mmol/L — ABNORMAL LOW (ref 22–32)
Calcium: 9.7 mg/dL (ref 8.9–10.3)
Chloride: 108 mmol/L (ref 98–111)
Creatinine, Ser: 0.97 mg/dL (ref 0.44–1.00)
GFR, Estimated: >60 mL/min (ref >60)
Glucose, Bld: 138 mg/dL — ABNORMAL HIGH (ref 70–99)
Potassium: 3.3 mmol/L — ABNORMAL LOW (ref 3.5–5.1)
Sodium: 137 mmol/L (ref 135–145)
Total Bilirubin: 1.1 mg/dL (ref 0.3–1.2)
Total Protein: 7.6 g/dL (ref 6.5–8.1)

## 2021-09-23 LAB — CBC
HCT: 40.3 % (ref 36.0–46.0)
Hemoglobin: 13.3 g/dL (ref 12.0–15.0)
MCH: 30.3 pg (ref 26.0–34.0)
MCHC: 33 g/dL (ref 30.0–36.0)
MCV: 91.8 fL (ref 80.0–100.0)
Platelets: 461 10*3/uL — ABNORMAL HIGH (ref 150–400)
RBC: 4.39 MIL/uL (ref 3.87–5.11)
RDW: 12.2 % (ref 11.5–15.5)
WBC: 10.2 10*3/uL (ref 4.0–10.5)
nRBC: 0 % (ref 0.0–0.2)

## 2021-09-23 LAB — CBG MONITORING, ED: Glucose-Capillary: 147 mg/dL — ABNORMAL HIGH (ref 70–99)

## 2021-09-23 LAB — LIPASE, BLOOD: Lipase: 21 U/L (ref 11–51)

## 2021-09-23 LAB — I-STAT BETA HCG BLOOD, ED (MC, WL, AP ONLY): I-stat hCG, quantitative: <5 m[IU]/mL (ref <5)

## 2021-09-23 MED ORDER — HYDROCODONE-ACETAMINOPHEN 5-325 MG PO TABS
2.0000 | ORAL_TABLET | Freq: Four times a day (QID) | ORAL | 0 refills | Status: DC | PRN
  Filled 2021-09-23: qty 10, 2d supply, fill #0

## 2021-09-23 MED ORDER — ONDANSETRON 4 MG PO TBDP
4.0000 mg | ORAL_TABLET | Freq: Three times a day (TID) | ORAL | 0 refills | Status: DC | PRN
  Filled 2021-09-23: qty 20, 7d supply, fill #0

## 2021-09-23 MED ORDER — LACTATED RINGERS IV BOLUS
1000.0000 mL | Freq: Once | INTRAVENOUS | Status: AC
  Administered 2021-09-23: 1000 mL via INTRAVENOUS

## 2021-09-23 MED ORDER — CIPROFLOXACIN HCL 500 MG PO TABS
500.0000 mg | ORAL_TABLET | Freq: Two times a day (BID) | ORAL | 0 refills | Status: AC
  Filled 2021-09-23: qty 14, 7d supply, fill #0

## 2021-09-23 MED ORDER — HYDROMORPHONE HCL 1 MG/ML IJ SOLN
1.0000 mg | Freq: Once | INTRAMUSCULAR | Status: AC
  Administered 2021-09-23: 1 mg via INTRAVENOUS
  Filled 2021-09-23: qty 1

## 2021-09-23 MED ORDER — PROCHLORPERAZINE EDISYLATE 10 MG/2ML IJ SOLN
10.0000 mg | Freq: Once | INTRAMUSCULAR | Status: AC
  Administered 2021-09-23: 10 mg via INTRAVENOUS
  Filled 2021-09-23: qty 2

## 2021-09-23 MED ORDER — ONDANSETRON 4 MG PO TBDP
4.0000 mg | ORAL_TABLET | Freq: Once | ORAL | Status: AC
  Administered 2021-09-23: 4 mg via ORAL

## 2021-09-23 MED ORDER — IOHEXOL 300 MG/ML  SOLN
100.0000 mL | Freq: Once | INTRAMUSCULAR | Status: AC | PRN
Start: 2021-09-23 — End: 2021-09-23
  Administered 2021-09-23: 100 mL via INTRAVENOUS

## 2021-09-23 MED ORDER — POTASSIUM CHLORIDE CRYS ER 20 MEQ PO TBCR
40.0000 meq | EXTENDED_RELEASE_TABLET | Freq: Once | ORAL | Status: AC
  Administered 2021-09-23: 40 meq via ORAL
  Filled 2021-09-23: qty 2

## 2021-09-23 MED ORDER — METRONIDAZOLE 500 MG PO TABS
500.0000 mg | ORAL_TABLET | Freq: Two times a day (BID) | ORAL | 0 refills | Status: AC
  Filled 2021-09-23: qty 14, 7d supply, fill #0

## 2021-09-23 NOTE — ED Triage Notes (Signed)
Patient complains of left sided abdominal pain with spasms with vomiting and diarrhea since 0400. Patient denies possible food poisoning.

## 2021-09-23 NOTE — ED Notes (Signed)
Pt again informed of need for urine. Pt is attempting again at the moment.

## 2021-09-23 NOTE — ED Notes (Signed)
Pt's mom very agitated because her daughter is having to wait in the lobby. The mother approached this tech several times wanting to know how much longer the wait would be, why her daughter wasn't in a recliner and why her daughter had to wait in the lobby. Advised pt's mother that we would have her daughter in a room as soon as one became available. This tech saw the pt get into a wheelchair and her mom wheeled her out of the lobby saying "thank you" as she walked by. I attempted to ask her where she was going and she continued to walk outside. I witnessed the pt and the mother leave the hospital. Moving pt OTF.

## 2021-09-23 NOTE — ED Triage Notes (Signed)
Pt endorses left side abd pain with n/v/d since 4 am today. Pt had [facility] before coming over here and given zofran.

## 2021-09-23 NOTE — ED Notes (Signed)
Pain started 0400 this morning, left lower Q, increase upon palpation and is having N/V/D. No abdominal surgeries and stated not preg.

## 2021-09-23 NOTE — ED Provider Notes (Addendum)
[facility] EMERGENCY DEPT Provider Note   CSN: [id number] Arrival date & time: 09/23/21  1321     History  Chief Complaint  Patient presents with   Abdominal Pain    Holly Brown is a 36 y.o. female.   Abdominal Pain  36 year old female with no significant past medical history presenting to the emergency department with abdominal pain.  The patient states that she woke this morning around 0 400 with severe sharp left lower quadrant abdominal pain, associated nausea, vomiting, diarrhea.  The pain comes in waves and is 10 out of 10 in severity.  She denies any flank pain.  She denies any burning while urinating or increased urinary frequency.  She is not pregnant.  She denies any history of abdominal surgeries.  She is passing gas.  She denies any suspect food intake and she denies any known sick contacts.  She endorses chills, denies any fevers.  She endorses suprapubic and left lower quadrant abdominal discomfort.  She has had multiple episodes of NBNB emesis and watery diarrhea. The patient denies any vaginal bleeding or vaginal discharge.  Home Medications Prior to Admission medications   Medication Sig Start Date End Date Taking? Authorizing Provider  ciprofloxacin (CIPRO) 500 MG tablet Take 1 tablet (500 mg total) by mouth every 12 (twelve) hours for 7 days. 09/23/21 09/30/21 Yes Regan Lemming, MD  HYDROcodone-acetaminophen (NORCO/VICODIN) 5-325 MG tablet Take 2 tablets by mouth every 6 (six) hours as needed. 09/23/21  Yes Regan Lemming, MD  metroNIDAZOLE (FLAGYL) 500 MG tablet Take 1 tablet (500 mg total) by mouth 2 (two) times daily for 7 days. 09/23/21 09/30/21 Yes Regan Lemming, MD  ondansetron (ZOFRAN-ODT) 4 MG disintegrating tablet Take 1 tablet (4 mg total) by mouth every 8 (eight) hours as needed for nausea or vomiting. 09/23/21  Yes Regan Lemming, MD  LORazepam (ATIVAN) 1 MG tablet Take 1 tablet (1 mg total) by mouth every 8 (eight) hours. 07/22/21   Chancy Milroy, MD  medroxyPROGESTERone (DEPO-PROVERA) 150 MG/ML injection Inject 1 mL (150 mg total) into the muscle every 3 (three) months. Patient not taking: Reported on 12/23/2018 12/14/18   Constant, Peggy, MD      Allergies    Patient has no known allergies.    Review of Systems   Review of Systems  Unable to perform ROS: Acuity of condition  Gastrointestinal:  Positive for abdominal pain.   Physical Exam Updated Vital Signs BP (!) 118/51 (BP Location: Right Arm)   Pulse 60   Temp 98.1 F (36.7 C)   Resp 20   Ht 5' 10.5" (1.791 m)   Wt 98 kg   SpO2 100%   BMI 30.56 kg/m  Physical Exam Vitals and nursing note reviewed.  Constitutional:      General: She is in acute distress.     Appearance: She is well-developed.     Comments: Writhing in bed, in severe pain, clutching her left lower quadrant  HENT:     Head: Normocephalic and atraumatic.  Eyes:     Conjunctiva/sclera: Conjunctivae normal.  Cardiovascular:     Rate and Rhythm: Normal rate and regular rhythm.     Heart sounds: No murmur heard. Pulmonary:     Effort: Pulmonary effort is normal. No respiratory distress.     Breath sounds: Normal breath sounds.  Abdominal:     Palpations: Abdomen is soft.     Tenderness: There is abdominal tenderness in the suprapubic area and left lower quadrant. There  is guarding. There is no rebound.  Musculoskeletal:        General: No swelling.     Cervical back: Neck supple.  Skin:    General: Skin is warm and dry.     Capillary Refill: Capillary refill takes less than 2 seconds.  Neurological:     Mental Status: She is alert.  Psychiatric:        Mood and Affect: Mood normal.    ED Results / Procedures / Treatments   Labs (all labs ordered are listed, but only abnormal results are displayed) Labs Reviewed  URINALYSIS, ROUTINE W REFLEX MICROSCOPIC - Abnormal; Notable for the following components:      Result Value   Specific Gravity, Urine >1.046 (*)    Glucose, UA 500  (*)    Ketones, ur >80 (*)    Protein, ur 30 (*)    Nitrite POSITIVE (*)    Bacteria, UA FEW (*)    All other components within normal limits    EKG None  Radiology CT ABDOMEN PELVIS W CONTRAST  Result Date: 09/23/2021 CLINICAL DATA:  LLQ abdominal pain EXAM: CT ABDOMEN AND PELVIS WITH CONTRAST TECHNIQUE: Multidetector CT imaging of the abdomen and pelvis was performed using the standard protocol following bolus administration of intravenous contrast. RADIATION DOSE REDUCTION: This exam was performed according to the departmental dose-optimization program which includes automated exposure control, adjustment of the mA and/or kV according to patient size and/or use of iterative reconstruction technique. CONTRAST:  154mL OMNIPAQUE IOHEXOL 300 MG/ML  SOLN COMPARISON:  None Available. FINDINGS: Lower chest: Included lung bases are clear.  Heart size is normal. Hepatobiliary: No focal liver abnormality is seen. No gallstones, gallbladder wall thickening, or biliary dilatation. Pancreas: Unremarkable. No pancreatic ductal dilatation or surrounding inflammatory changes. Spleen: Normal in size without focal abnormality. Adrenals/Urinary Tract: Unremarkable adrenal glands. Kidneys enhance symmetrically without focal lesion, stone, or hydronephrosis. Ureters are nondilated. Urinary bladder appears unremarkable. Stomach/Bowel: Stomach is normal in appearance. Unremarkable small bowel. No dilated loops of bowel. Normal appendix in the right lower quadrant (series 2, image 48). Long segment mild colonic wall thickening, predominantly involving the descending colon and distal transverse colon. No pericolonic inflammatory changes. Vascular/Lymphatic: No significant vascular findings are present. No enlarged abdominal or pelvic lymph nodes. Reproductive: Uterus and bilateral adnexa are unremarkable. Other: No free fluid. No abdominopelvic fluid collection. No pneumoperitoneum. No abdominal wall hernia. Musculoskeletal:  No acute or significant osseous findings. IMPRESSION: 1. Long segment mild colonic wall thickening, predominantly involving the descending colon and distal transverse colon, suggestive of a mild colitis. 2. Normal appendix. Electronically Signed   By: Davina Poke D.O.   On: 09/23/2021 15:55    Procedures Procedures    Medications Ordered in ED Medications  potassium chloride SA (KLOR-CON M) CR tablet 40 mEq (has no administration in time range)  lactated ringers bolus 1,000 mL (0 mLs Intravenous Stopped 09/23/21 1714)  prochlorperazine (COMPAZINE) injection 10 mg (10 mg Intravenous Given 09/23/21 1528)  HYDROmorphone (DILAUDID) injection 1 mg (1 mg Intravenous Given 09/23/21 1529)  iohexol (OMNIPAQUE) 300 MG/ML solution 100 mL (100 mLs Intravenous Contrast Given 09/23/21 1543)    ED Course/ Medical Decision Making/ A&P                           Medical Decision Making Amount and/or Complexity of Data Reviewed Labs: ordered. Radiology: ordered.  Risk Prescription drug management.   36 year old female with no significant  past medical history presenting to the emergency department with abdominal pain.  The patient states that she woke this morning around 0 400 with severe sharp left lower quadrant abdominal pain, associated nausea, vomiting, diarrhea.  The pain comes in waves and is 10 out of 10 in severity.  She denies any flank pain.  She denies any burning while urinating or increased urinary frequency.  She is not pregnant.  She denies any history of abdominal surgeries.  She is passing gas.  She denies any suspect food intake and she denies any known sick contacts.  She endorses chills, denies any fevers.  She endorses suprapubic and left lower quadrant abdominal discomfort.  She has had multiple episodes of NBNB emesis and watery diarrhea.  The patient denies any vaginal bleeding or vaginal discharge.  Vitals and telemetry on arrival: Afebrile, hemodynamically stable, not tachycardic or  tachypneic, normal sinus rhythm noted on cardiac telemetry  Pertinent exam findings include: Left lower quadrant abdominal tenderness, suprapubic tenderness, guarding present, no CVA tenderness.  Differential Diagnosis: Gastroenteritis, appendicitis, Bowel Obstruction, AAA, ACS, pneumonia, pneumothorax, Pyelonephritis, Nephrolithiasis, Pancreatitis, Cholecystitis, Shingles, Perforated Bowel or Ulcer, Diverticulosis/itis, Ischemic Mesentery, Inflammatory Bowel Disease, Strangulated/Incarcerated Hernia, gastritis, PUD.   Lab results include: Urine pregnancy negative, CBC without a leukocytosis or anemia, CMP without significant electrolyte abnormality, mild hypokalemia to 3.3, normal renal and liver function, urinalysis with evidence of developing UTI, positive nitrates, few bacteria.  Imaging results include: CT abdomen pelvis as below: IMPRESSION:  1. Long segment mild colonic wall thickening, predominantly  involving the descending colon and distal transverse colon,  suggestive of a mild colitis.  2. Normal appendix.    Course of tx has consisted of: Patient received IV Compazine, IV Dilaudid, and LR bolus  Thought process: Patient presenting with nausea, vomiting, diarrhea, found to have no acute abnormality beyond a long segment mild colonic wall thickening in the distal transverse colon and descending colon suggestive of mild colitis.  She has had no vaginal discharge and her symptoms are more consistent with a gastroenteritis and colitis rather than PID, in addition she is not concerned for STIs. Normal-appearing appendix with no other acute changes noted.  Urinalysis with mild suggestion of developing UTI and with patient suprapubic tenderness on exam, we will go ahead and treat with antibiotics, Norco, Zofran.  The patient successfully passed a p.o. challenge on repeat assessment and was symptomatically improved.   Return precautions provided in the event of inability tolerate oral intake,  severe worsening of abdominal pain.  Final Clinical Impression(s) / ED Diagnoses Final diagnoses:  Colitis  Acute cystitis without hematuria  Nausea vomiting and diarrhea  Hypokalemia    Rx / DC Orders ED Discharge Orders          Ordered    HYDROcodone-acetaminophen (NORCO/VICODIN) 5-325 MG tablet  Every 6 hours PRN        09/23/21 1715    ciprofloxacin (CIPRO) 500 MG tablet  Every 12 hours        09/23/21 1715    metroNIDAZOLE (FLAGYL) 500 MG tablet  2 times daily        09/23/21 1715    ondansetron (ZOFRAN-ODT) 4 MG disintegrating tablet  Every 8 hours PRN        09/23/21 1715              Regan Lemming, MD 09/23/21 Jamal Maes    Regan Lemming, MD 09/23/21 Karren Cobble    Regan Lemming, MD 09/23/21 1724

## 2021-09-23 NOTE — ED Notes (Signed)
Pt passed PO challenge.

## 2021-09-23 NOTE — ED Notes (Signed)
Pt assessed by DR Mannie Stabile in triage . CBG done and Zofran given . Pt  to ED by Mother .

## 2021-09-23 NOTE — ED Notes (Signed)
Discharge paperwork given and understood. 

## 2021-09-23 NOTE — ED Triage Notes (Signed)
PT reports ABD pain and vomiting started at 4 AM .

## 2021-09-23 NOTE — Discharge Instructions (Addendum)
Your laboratory work-up was largely reassuring.  Your CT abdomen pelvis revealed evidence of a colitis which is likely the result of an infectious diarrhea.  You also have evidence of a developing urinary tract infection.  Due to this, we will treat you with antibiotics, pain medicine and nausea medicine.  Make sure you stay hydrated with an electrolyte-based solution over the next few days.  Return to the emergency department in the event of inability to tolerate oral intake, concern for dehydration, severe worsening of your abdominal discomfort.

## 2021-12-19 ENCOUNTER — Ambulatory Visit (HOSPITAL_COMMUNITY)
Admission: EM | Admit: 2021-12-19 | Discharge: 2021-12-19 | Disposition: A | Discharge status: Home / Self Care | Payer: No Typology Code available for payment source | Attending: Family Medicine | Admitting: Family Medicine

## 2021-12-19 ENCOUNTER — Encounter (HOSPITAL_COMMUNITY): Payer: Self-pay | Admitting: Emergency Medicine

## 2021-12-19 DIAGNOSIS — L02412 Cutaneous abscess of left axilla: Secondary | ICD-10-CM | POA: Diagnosis not present

## 2021-12-19 MED ORDER — DOXYCYCLINE HYCLATE 100 MG PO CAPS: 100.0000 mg | ORAL_CAPSULE | Freq: Two times a day (BID) | ORAL | 0 refills | Status: DC

## 2021-12-19 MED ORDER — HYDROCODONE-ACETAMINOPHEN 5-325 MG PO TABS: 1.0000 | ORAL_TABLET | Freq: Four times a day (QID) | ORAL | 0 refills | Status: DC | PRN

## 2021-12-19 MED ORDER — LIDOCAINE-EPINEPHRINE 1 %-1:100000 IJ SOLN
INTRAMUSCULAR | Status: AC
  Filled 2021-12-19: qty 1

## 2021-12-19 NOTE — ED Triage Notes (Signed)
Patient c/o abscess in left axilla for several weeks.  The area is painful.  Patient has applied warm compresses and taken Ibuprofen for pain.

## 2021-12-19 NOTE — Discharge Instructions (Addendum)

## 2021-12-19 NOTE — ED Provider Notes (Signed)
[facility]   [id number] 12/19/21 Arrival Time: 1340  ASSESSMENT & PLAN:  1. Abscess of left axilla     Incision and Drainage Procedure Note  Anesthesia: 1% lidocaine with epinephrine  Procedure Details  The procedure, risks and complications have been discussed in detail (including, but not limited to pain and bleeding) with the patient.  The skin induration was prepped and draped in the usual fashion. After adequate local anesthesia, I&D with a #11 blade was performed on the left axilla with purulent drainage.  EBL: minimal Drains: none Packing: none Condition: Tolerated procedure well Complications: none.  Meds ordered this encounter  Medications   HYDROcodone-acetaminophen (NORCO/VICODIN) 5-325 MG tablet    Sig: Take 1 tablet by mouth every 6 (six) hours as needed for moderate pain or severe pain.    Dispense:  8 tablet    Refill:  0   doxycycline (VIBRAMYCIN) 100 MG capsule    Sig: Take 1 capsule (100 mg total) by mouth 2 (two) times daily.    Dispense:  14 capsule    Refill:  0   [place] Controlled Substances Registry consulted for this patient. I feel the risk/benefit ratio today is favorable for proceeding with this prescription for a controlled substance. Medication sedation precautions given.  Wound care instructions discussed. To return in 48 hours for wound check if needed.  Finish all antibiotics. OTC analgesics as needed.  Reviewed expectations re: course of current medical issues. Questions answered. Outlined signs and symptoms indicating need for more acute intervention. Patient verbalized understanding. After Visit Summary given.   SUBJECTIVE:  Holly Brown is a 36 y.o. female who presents with a possible infection of her LEFT axilla. Onset gradual, approximately  1-2  weeks ago without active drainage and without active bleeding. Symptoms have gradually worsened since beginning. Fever: absent. OTC/home treatment:  none.   OBJECTIVE:  Vitals:   12/19/21 1355 12/19/21 1356  BP: 129/85   Pulse: 65   Resp: 18   Temp: 98.9 F (37.2 C)   TempSrc: Oral   SpO2: 98%   Weight:  102.1 kg  Height:  5\' 10"  (1.778 m)     General appearance: alert; no distress LEFT axilla: approx 1.5 x 1 cm induration, tender to touch; no active drainage or bleeding Psychological: alert and cooperative; normal mood and affect  No Known Allergies  Past Medical History:  Diagnosis Date   Gestational diabetes    metformin   History of UTI    Migraines    Seizures (HCC)    Jan.2017   Vaginal Pap smear, abnormal    Social History   Socioeconomic History   Marital status: Single    Spouse name: Not on file   Number of children: 3   Years of education: college   Highest education level: Not on file  Occupational History   Occupation: Securities Security   Tobacco Use   Smoking status: Former    Packs/day: 0.25    Types: Cigarettes    Start date: 12/21/2011   Smokeless tobacco: Never  Vaping Use   Vaping Use: Never used  Substance and Sexual Activity   Alcohol use: Not Currently    Alcohol/week: 0.0 standard drinks of alcohol    Comment: ocassionally    Drug use: No   Sexual activity: Not Currently    Partners: Male    Birth control/protection: None  Other Topics Concern   Not on file  Social History Narrative   Lives with her grandmother  and her three children   Does not drink caffeine    Social Determinants of Health   Financial Resource Strain: Low Risk  (02/01/2018)   Overall Financial Resource Strain (CARDIA)    Difficulty of Paying Living Expenses: Not hard at all  Food Insecurity: No Food Insecurity (02/01/2018)   Hunger Vital Sign    Worried About Running Out of Food in the Last Year: Never true    Ran Out of Food in the Last Year: Never true  Transportation Needs: No Transportation Needs (02/01/2018)   PRAPARE - Administrator, Civil Service (Medical): No    Lack of  Transportation (Non-Medical): No  Physical Activity: Not on file  Stress: No Stress Concern Present (08/17/2018)   [employer] of Occupational Health - Occupational Stress Questionnaire    Feeling of Stress : Not at all  Social Connections: Unknown (08/31/2018)   Social Connection and Isolation Panel [NHANES]    Frequency of Communication with Friends and Family: More than three times a week    Frequency of Social Gatherings with Friends and Family: More than three times a week    Attends Religious Services: Not on Marketing executive or Organizations: Not on file    Attends Banker Meetings: Not on file    Marital Status: Not on file   Family History  Problem Relation Age of Onset   Colon cancer Father    Hypertension Maternal Grandmother    Diabetes Maternal Grandmother    Hypertension Paternal Grandmother    Diabetes Mother    Past Surgical History:  Procedure Laterality Date   DILATION AND CURETTAGE OF UTERUS     LAPAROSCOPIC TUBAL LIGATION Bilateral 12/08/2018   Procedure: LAPAROSCOPIC TUBAL LIGATION;  Surgeon: Catalina Antigua, MD;  Location: [facility];  Service: Gynecology;  Laterality: Bilateral;   WISDOM TOOTH EXTRACTION  2011            Mardella Layman, MD 12/19/21 1452

## 2022-02-25 ENCOUNTER — Other Ambulatory Visit (HOSPITAL_COMMUNITY)
Admission: RE | Admit: 2022-02-25 | Discharge: 2022-02-25 | Disposition: A | Discharge status: Home / Self Care | Payer: No Typology Code available for payment source | Source: Ambulatory Visit | Attending: Obstetrics | Admitting: Obstetrics

## 2022-02-25 ENCOUNTER — Ambulatory Visit (INDEPENDENT_AMBULATORY_CARE_PROVIDER_SITE_OTHER): Payer: No Typology Code available for payment source | Admitting: Obstetrics

## 2022-02-25 ENCOUNTER — Encounter: Payer: Self-pay | Admitting: Obstetrics

## 2022-02-25 VITALS — BP 108/67 | HR 91 | Ht 70.5 in | Wt 217.0 lb

## 2022-02-25 DIAGNOSIS — Z01419 Encounter for gynecological examination (general) (routine) without abnormal findings: Secondary | ICD-10-CM | POA: Diagnosis not present

## 2022-02-25 DIAGNOSIS — E669 Obesity, unspecified: Secondary | ICD-10-CM

## 2022-02-25 DIAGNOSIS — D069 Carcinoma in situ of cervix, unspecified: Secondary | ICD-10-CM

## 2022-02-25 DIAGNOSIS — N946 Dysmenorrhea, unspecified: Secondary | ICD-10-CM

## 2022-02-25 DIAGNOSIS — Z113 Encounter for screening for infections with a predominantly sexual mode of transmission: Secondary | ICD-10-CM

## 2022-02-25 DIAGNOSIS — N898 Other specified noninflammatory disorders of vagina: Secondary | ICD-10-CM

## 2022-02-25 DIAGNOSIS — Z124 Encounter for screening for malignant neoplasm of cervix: Secondary | ICD-10-CM | POA: Diagnosis present

## 2022-02-25 MED ORDER — IBUPROFEN 800 MG PO TABS: 800.0000 mg | ORAL_TABLET | Freq: Three times a day (TID) | ORAL | 5 refills | Status: DC | PRN

## 2022-02-25 NOTE — Progress Notes (Signed)
Subjective:        Holly Brown is a 36 y.o. female here for a routine exam.  Current complaints: Painful periods and vaginal discharge.    Personal health questionnaire:  Is patient Ashkenazi Jewish, have a family history of breast and/or ovarian cancer: no Is there a family history of uterine cancer diagnosed at age < 18, gastrointestinal cancer, urinary tract cancer, family member who is a Field seismologist syndrome-associated carrier: no Is the patient overweight and hypertensive, family history of diabetes, personal history of gestational diabetes, preeclampsia or PCOS: no Is patient over 53, have PCOS,  family history of premature CHD under age 71, diabetes, smoke, have hypertension or peripheral artery disease:  no At any time, has a partner hit, kicked or otherwise hurt or frightened you?: no Over the past 2 weeks, have you felt down, depressed or hopeless?: no Over the past 2 weeks, have you felt little interest or pleasure in doing things?:no   Gynecologic History Patient's last menstrual period was 02/10/2022. Contraception: tubal ligation Last Pap: 05-21-2021. Results were: normal Last mammogram: n/a. Results were: n/a  Obstetric History OB History  Gravida Para Term Preterm AB Living  5 4 3 1 1 4   SAB IAB Ectopic Multiple Live Births  1     0 4    # Outcome Date GA Lbr Len/2nd Weight Sex Delivery Anes PTL Lv  5 Term 08/31/18 [redacted]w[redacted]d 03:39 6 lb 15.6 oz (3.164 kg) F Vag-Spont None  LIV  4 Term 11/08/12 [redacted]w[redacted]d  7 lb 12.6 oz (3.532 kg) M Vag-Spont None  LIV     Birth Comments: No complications during pregnancy or delivery.  No special care nursery, went home with mother after 2 days.  3 Preterm 08/04/09 [redacted]w[redacted]d  7 lb 6 oz (3.345 kg) M Vag-Spont  Y LIV  2 Term 10/02/08    F Vag-Spont   LIV  1 SAB             Past Medical History:  Diagnosis Date   Gestational diabetes    metformin   History of UTI    Migraines    Seizures ([facility])    Jan.2017   Vaginal Pap smear, abnormal      Past Surgical History:  Procedure Laterality Date   DILATION AND CURETTAGE OF UTERUS     LAPAROSCOPIC TUBAL LIGATION Bilateral 12/08/2018   Procedure: LAPAROSCOPIC TUBAL LIGATION;  Surgeon: Mora Bellman, MD;  Location: [facility];  Service: Gynecology;  Laterality: Bilateral;   WISDOM TOOTH EXTRACTION  2011     Current Outpatient Medications:    ibuprofen (ADVIL) 800 MG tablet, Take 1 tablet (800 mg total) by mouth every 8 (eight) hours as needed., Disp: 30 tablet, Rfl: 5   doxycycline (VIBRAMYCIN) 100 MG capsule, Take 1 capsule (100 mg total) by mouth 2 (two) times daily., Disp: 14 capsule, Rfl: 0   HYDROcodone-acetaminophen (NORCO/VICODIN) 5-325 MG tablet, Take 1 tablet by mouth every 6 (six) hours as needed for moderate pain or severe pain. (Patient not taking: Reported on 02/25/2022), Disp: 8 tablet, Rfl: 0   medroxyPROGESTERone (DEPO-PROVERA) 150 MG/ML injection, Inject 1 mL (150 mg total) into the muscle every 3 (three) months. (Patient not taking: Reported on 12/23/2018), Disp: 1 mL, Rfl: 3 No Known Allergies  Social History   Tobacco Use   Smoking status: Former    Packs/day: 0.25    Types: Cigarettes    Start date: 12/21/2011   Smokeless tobacco: Never  Substance Use  Topics   Alcohol use: Not Currently    Alcohol/week: 0.0 standard drinks of alcohol    Comment: ocassionally     Family History  Problem Relation Age of Onset   Colon cancer Father    Hypertension Maternal Grandmother    Diabetes Maternal Grandmother    Hypertension Paternal Grandmother    Diabetes Mother       Review of Systems  Constitutional: negative for fatigue and weight loss Respiratory: negative for cough and wheezing Cardiovascular: negative for chest pain, fatigue and palpitations Gastrointestinal: negative for abdominal pain and change in bowel habits Musculoskeletal:negative for myalgias Neurological: negative for gait problems and tremors Behavioral/Psych: negative for  abusive relationship, depression Endocrine: negative for temperature intolerance    Genitourinary:negative for abnormal menstrual periods, genital lesions, hot flashes, sexual problems and vaginal discharge Integument/breast: negative for breast lump, breast tenderness, nipple discharge and skin lesion(s)    Objective:       BP 108/67   Pulse 91   Ht 5' 10.5" (1.791 m)   Wt 217 lb (98.4 kg)   LMP 02/10/2022   BMI 30.70 kg/m  General:   alert  Skin:   no rash or abnormalities  Lungs:   clear to auscultation bilaterally  Heart:   regular rate and rhythm, S1, S2 normal, no murmur, click, rub or gallop  Breasts:   normal without suspicious masses, skin or nipple changes or axillary nodes  Abdomen:  normal findings: no organomegaly, soft, non-tender and no hernia  Pelvis:  External genitalia: normal general appearance Urinary system: urethral meatus normal and bladder without fullness, nontender Vaginal: normal without tenderness, induration or masses Cervix: normal appearance Adnexa: normal bimanual exam Uterus: anteverted and non-tender, normal size   Lab Review Urine pregnancy test Labs reviewed yes Radiologic studies reviewed no  I have spent a total of 25 minutes of face-to-face time, excluding clinical staff time, reviewing notes and preparing to see patient, ordering tests and/or medications, and counseling the patient.   Assessment:     1. Encounter for gynecological examination with Papanicolaou smear of cervix Rx: - Cytology - PAP( [facility] [facility])  2. Vaginal discharge Rx: - Cervicovaginal ancillary only( [facility])  3. Screening for STD (sexually transmitted disease) Rx: - HIV Antibody (routine testing w rflx) - RPR - Hepatitis B Surface AntiGEN - Hepatitis C Antibody  4. Dysmenorrhea Rx: - ibuprofen (ADVIL) 800 MG tablet; Take 1 tablet (800 mg total) by mouth every 8 (eight) hours as needed.  Dispense: 30 tablet; Refill: 5  5. High grade squamous  intraepithelial lesion (HGSIL), grade 3 CIN, on biopsy of cervix - LEEP to be scheduled  6. Obesity (BMI 30-39.9) - weight reduction recommended    Plan:    Education reviewed: calcium supplements, depression evaluation, low fat, low cholesterol diet, safe sex/STD prevention, self breast exams, and weight bearing exercise. Follow up in: a few weeks.  LEEP to be scheduled.   Meds ordered this encounter  Medications   ibuprofen (ADVIL) 800 MG tablet    Sig: Take 1 tablet (800 mg total) by mouth every 8 (eight) hours as needed.    Dispense:  30 tablet    Refill:  5   Orders Placed This Encounter  Procedures   HIV Antibody (routine testing w rflx)   RPR   Hepatitis B Surface AntiGEN   Hepatitis C Antibody    Brock Bad, MD 02/25/2022 4:22 PM

## 2022-02-26 LAB — CERVICOVAGINAL ANCILLARY ONLY
Candida Glabrata: NEGATIVE
Candida Vaginitis: NEGATIVE
Chlamydia: POSITIVE — AB
Comment: NEGATIVE
Comment: NEGATIVE
Comment: NEGATIVE
Comment: NEGATIVE
Comment: NORMAL
Neisseria Gonorrhea: NEGATIVE
Trichomonas: POSITIVE — AB

## 2022-02-26 LAB — HEPATITIS C ANTIBODY: Hep C Virus Ab: NONREACTIVE

## 2022-02-26 LAB — HEPATITIS B SURFACE ANTIGEN: Hepatitis B Surface Ag: NEGATIVE

## 2022-02-26 LAB — HIV ANTIBODY (ROUTINE TESTING W REFLEX): HIV Screen 4th Generation wRfx: NONREACTIVE

## 2022-02-26 LAB — RPR: RPR Ser Ql: NONREACTIVE

## 2022-02-27 ENCOUNTER — Telehealth: Payer: Self-pay | Admitting: Emergency Medicine

## 2022-02-27 ENCOUNTER — Other Ambulatory Visit: Payer: Self-pay | Admitting: Obstetrics

## 2022-02-27 DIAGNOSIS — A749 Chlamydial infection, unspecified: Secondary | ICD-10-CM

## 2022-02-27 DIAGNOSIS — A599 Trichomoniasis, unspecified: Secondary | ICD-10-CM

## 2022-02-27 MED ORDER — METRONIDAZOLE 500 MG PO TABS: 500.0000 mg | ORAL_TABLET | Freq: Two times a day (BID) | ORAL | 2 refills | Status: DC

## 2022-02-27 MED ORDER — DOXYCYCLINE HYCLATE 100 MG PO CAPS: 100.0000 mg | ORAL_CAPSULE | Freq: Two times a day (BID) | ORAL | 0 refills | Status: DC

## 2022-02-27 NOTE — Telephone Encounter (Signed)
TC to patient to discuss results and Rx. Instructed to abstain from sex 7 days after treatment complete, and that partner should be treated.

## 2022-02-28 LAB — CYTOLOGY - PAP
Comment: NEGATIVE
Comment: NEGATIVE
Comment: NEGATIVE
Diagnosis: HIGH — AB
HPV 16: POSITIVE — AB
HPV 18 / 45: NEGATIVE
High risk HPV: POSITIVE — AB

## 2022-03-05 ENCOUNTER — Telehealth: Payer: Self-pay | Admitting: Emergency Medicine

## 2022-03-05 NOTE — Telephone Encounter (Signed)
-----   Message from Charles A Harper, MD sent at 02/28/2022  5:52 PM EST ----- HGSIL pap smear.  Please sschedule colposcopy. 

## 2022-03-05 NOTE — Telephone Encounter (Signed)
Attempted TC to patient. LVM to return call to clinic. 

## 2022-03-06 ENCOUNTER — Encounter (HOSPITAL_COMMUNITY): Payer: Self-pay

## 2022-03-06 ENCOUNTER — Ambulatory Visit (HOSPITAL_COMMUNITY)
Admission: RE | Admit: 2022-03-06 | Discharge: 2022-03-06 | Disposition: A | Discharge status: Home / Self Care | Payer: No Typology Code available for payment source | Source: Ambulatory Visit | Attending: Internal Medicine | Admitting: Internal Medicine

## 2022-03-06 ENCOUNTER — Telehealth: Payer: Self-pay | Admitting: Emergency Medicine

## 2022-03-06 VITALS — BP 99/67 | HR 101 | Temp 100.5°F | Resp 20

## 2022-03-06 DIAGNOSIS — J029 Acute pharyngitis, unspecified: Secondary | ICD-10-CM | POA: Diagnosis not present

## 2022-03-06 DIAGNOSIS — U071 COVID-19: Secondary | ICD-10-CM | POA: Insufficient documentation

## 2022-03-06 DIAGNOSIS — J111 Influenza due to unidentified influenza virus with other respiratory manifestations: Secondary | ICD-10-CM

## 2022-03-06 LAB — POC INFLUENZA A AND B ANTIGEN (URGENT CARE ONLY)
INFLUENZA A ANTIGEN, POC: NEGATIVE
INFLUENZA B ANTIGEN, POC: NEGATIVE

## 2022-03-06 LAB — RESP PANEL BY RT-PCR (FLU A&B, COVID) ARPGX2
Influenza A by PCR: NEGATIVE
Influenza B by PCR: NEGATIVE
SARS Coronavirus 2 by RT PCR: POSITIVE — AB

## 2022-03-06 LAB — POCT RAPID STREP A, ED / UC: Streptococcus, Group A Screen (Direct): NEGATIVE

## 2022-03-06 MED ORDER — IBUPROFEN 100 MG/5ML PO SUSP
ORAL | Status: AC
  Filled 2022-03-06: qty 30

## 2022-03-06 MED ORDER — IBUPROFEN 100 MG/5ML PO SUSP
600.0000 mg | Freq: Once | ORAL | Status: AC
  Administered 2022-03-06: 600 mg via ORAL

## 2022-03-06 NOTE — Discharge Instructions (Signed)
You have a viral upper respiratory infection.  COVID-19 and flu testing is pending. We will call you with results if positive. If your COVID test is positive, you must stay at home until day 6 of symptoms. On day 6, you may go out into public and go back to work, but you must wear a mask until day 11 of symptoms to prevent spread to others.  Take guaifenesin 1200mg   every 12 hours to thin your mucous so that you can get it out of your body easier with coughing/blowing your nose. Drink plenty of water while taking this medication so that it works well in your body (at least 8 cups a day).   You may take tylenol 1,000mg  and ibuprofen 600mg  every 6 hours with food as needed for fever/chills, sore throat, aches/pains, and inflammation associated with viral illness. Take this with food to avoid stomach upset.    You may do salt water and baking soda gargles every 4 hours as needed for your throat pain.  Please put 1 teaspoon of salt and 1/2 teaspoon of baking soda in 8 ounces of warm water then gargle and spit the water out. You may also put 1 tablespoon of honey in warm water and drink this to soothe your throat.  Place a humidifier in your room at night to help decrease dry air that can irritate your airway and cause you to have a sore throat and cough.  Please try to eat a well-balanced diet while you are sick so that your body gets proper nutrition to heal.  If you develop any new or worsening symptoms, please return.  If your symptoms are severe, please go to the emergency room.  Follow-up with your primary care provider for further evaluation and management of your symptoms as well as ongoing wellness visits.  I hope you feel better!

## 2022-03-06 NOTE — ED Triage Notes (Signed)
Pt states she has had sore throat, runny nose, runny eyes and chills for 3 days. She hasn't taken any meds but making sure she is hydrated. He has a lot of sinus pressure and congestion.

## 2022-03-06 NOTE — Telephone Encounter (Signed)
-----   Message from Brock Bad, MD sent at 02/28/2022  5:52 PM EST ----- HGSIL pap smear.  Please sschedule colposcopy.

## 2022-03-06 NOTE — Telephone Encounter (Signed)
TC to patient to inform of results. Pt scheduled for LEEP on 11/28.

## 2022-03-06 NOTE — ED Provider Notes (Signed)
[facility]    CSN: [id number] Arrival date & time: 03/06/22  1245      History   Chief Complaint Chief Complaint  Patient presents with   Chills    Sore throat, runny nose - Entered by patient   Nasal Congestion   Sore Throat    HPI Holly Brown is a 36 y.o. female.   Patient presents to urgent care for evaluation of nasal congestion, fever/chills, body aches, and fatigue that started abruptly yesterday.  A coworker of hers recently tested positive for flu and she believes she was exposed to his illness.  Febrile currently at 100.5 with heart rate of 101.  Denies chest pain, heart palpitations, shortness of breath, dizziness, nausea, vomiting, abdominal pain, ear pain, headache, and sore throat.  No blurry vision, dizziness, or decreased visual acuity.  Denies history of chronic respiratory problems.  She is not coughing currently.  She has children at home but no one in the household is sick.  She is not a smoker and denies drug use.  She has not attempted use of any over-the-counter medications prior to arrival urgent care for symptoms.   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   Gestational diabetes mellitus (GDM) in third trimester controlled on oral hypoglycemic drug 08/03/2018   Urinary tract infection affecting care of mother in first trimester, antepartum 03/07/2018   Supervision of high risk pregnancy, antepartum 02/01/2018   History of preterm delivery, currently pregnant 02/01/2018   Cervical high risk HPV (human papillomavirus) test positive 05/18/2017   Gonorrhea affecting pregnancy  in first trimester 05/15/2017   Family history of colon cancer 10/10/2015   Syncope 07/04/2014   Heart palpitations 07/04/2014    Past Surgical History:  Procedure Laterality Date   DILATION AND CURETTAGE OF UTERUS     LAPAROSCOPIC TUBAL LIGATION Bilateral 12/08/2018   Procedure: LAPAROSCOPIC TUBAL LIGATION;  Surgeon: Catalina Antiguaonstant, Peggy, MD;  Location: [facility];  Service: Gynecology;  Laterality: Bilateral;   WISDOM TOOTH EXTRACTION  2011    OB History     Gravida  5   Para  4   Term  3   Preterm  1   AB  1   Living  4      SAB  1   IAB      Ectopic      Multiple  0   Live Births  4            Home Medications    Prior to Admission medications   Medication Sig Start Date End Date Taking? Authorizing Provider  metroNIDAZOLE (FLAGYL) 500 MG tablet Take 1 tablet (500 mg total) by mouth 2 (two) times daily. 02/27/22  Yes Brock BadHarper, Charles A, MD  doxycycline (VIBRAMYCIN) 100 MG capsule Take 1 capsule (100 mg total) by mouth 2 (two) times daily. 02/27/22   Brock BadHarper, Charles A, MD  HYDROcodone-acetaminophen (NORCO/VICODIN) 5-325 MG tablet Take 1 tablet by mouth every 6 (six) hours as needed for moderate pain or severe pain. Patient not taking: Reported on 02/25/2022  12/19/21   Mardella Layman, MD  ibuprofen (ADVIL) 800 MG tablet Take 1 tablet (800 mg total) by mouth every 8 (eight) hours as needed. 02/25/22   Brock Bad, MD  medroxyPROGESTERone (DEPO-PROVERA) 150 MG/ML injection Inject 1 mL (150 mg total) into the muscle every 3 (three) months. Patient not taking: Reported on 12/23/2018 12/14/18   Constant, Peggy, MD    Family History Family History  Problem Relation Age of Onset   Colon cancer Father    Hypertension Maternal Grandmother    Diabetes Maternal Grandmother    Hypertension Paternal Grandmother    Diabetes Mother     Social History Social History   Tobacco Use   Smoking status: Former    Packs/day: 0.25    Types: Cigarettes     Start date: 12/21/2011   Smokeless tobacco: Never  Vaping Use   Vaping Use: Never used  Substance Use Topics   Alcohol use: Not Currently    Alcohol/week: 0.0 standard drinks of alcohol    Comment: ocassionally    Drug use: No     Allergies   Patient has no known allergies.   Review of Systems Review of Systems Per HPI  Physical Exam Triage Vital Signs ED Triage Vitals  Enc Vitals Group     BP 03/06/22 1308 99/67     Pulse Rate 03/06/22 1308 (!) 101     Resp 03/06/22 1308 20     Temp 03/06/22 1308 (!) 100.5 F (38.1 C)     Temp Source 03/06/22 1308 Oral     SpO2 03/06/22 1308 99 %     Weight --      Height --      Head Circumference --      Peak Flow --      Pain Score 03/06/22 1306 8     Pain Loc --      Pain Edu? --      Excl. in GC? --    No data found.  Updated Vital Signs BP 99/67 (BP Location: Right Arm)   Pulse (!) 101   Temp (!) 100.5 F (38.1 C) (Oral)   Resp 20   LMP 02/10/2022 (Approximate)   SpO2 99%   Visual Acuity Right Eye Distance:   Left Eye Distance:   Bilateral Distance:    Right Eye Near:   Left Eye Near:    Bilateral Near:     Physical Exam Vitals and nursing note reviewed.  Constitutional:      Appearance: She is ill-appearing. She is not toxic-appearing.  HENT:     Head: Normocephalic and atraumatic.     Right Ear: Hearing, tympanic membrane, ear canal and external ear normal.     Left Ear: Hearing, tympanic membrane, ear canal and external ear normal.     Nose: Congestion present. No rhinorrhea.     Mouth/Throat:     Lips: Pink.     Mouth: Mucous membranes are moist.     Pharynx: Posterior oropharyngeal erythema present.     Comments: Mild erythema to posterior oropharynx with small amount of clear postnasal drainage visualized. Airway intact and patent. Eyes:     General: Lids are normal. Vision grossly intact. Gaze aligned appropriately.        Right eye: No discharge.        Left eye: No discharge.     Extraocular  Movements: Extraocular movements intact.     Conjunctiva/sclera: Conjunctivae normal.     Pupils: Pupils are equal,  round, and reactive to light.  Cardiovascular:     Rate and Rhythm: Normal rate and regular rhythm.     Heart sounds: Normal heart sounds, S1 normal and S2 normal.  Pulmonary:     Effort: Pulmonary effort is normal. No respiratory distress.     Breath sounds: Normal breath sounds and air entry.  Abdominal:     General: Abdomen is flat. Bowel sounds are normal.     Palpations: Abdomen is soft.  Musculoskeletal:     Cervical back: Neck supple. No tenderness.  Lymphadenopathy:     Cervical: Cervical adenopathy present.  Skin:    General: Skin is warm and dry.     Capillary Refill: Capillary refill takes less than 2 seconds.     Findings: No rash.  Neurological:     General: No focal deficit present.     Mental Status: She is alert and oriented to person, place, and time. Mental status is at baseline.     Cranial Nerves: No dysarthria or facial asymmetry.     Motor: No weakness.     Gait: Gait normal.  Psychiatric:        Mood and Affect: Mood normal.        Speech: Speech normal.        Behavior: Behavior normal.        Thought Content: Thought content normal.        Judgment: Judgment normal.      UC Treatments / Results  Labs (all labs ordered are listed, but only abnormal results are displayed) Labs Reviewed  RESP PANEL BY RT-PCR (FLU A&B, COVID) ARPGX2  POC INFLUENZA A AND B ANTIGEN (URGENT CARE ONLY)  POCT RAPID STREP A, ED / UC    EKG   Radiology No results found.  Procedures Procedures (including critical care time)  Medications Ordered in UC Medications  ibuprofen (ADVIL) 100 MG/5ML suspension 600 mg (600 mg Oral Given 03/06/22 1330)    Initial Impression / Assessment and Plan / UC Course  I have reviewed the triage vital signs and the nursing notes.  Pertinent labs & imaging results that were available during my care of the patient  were reviewed by me and considered in my medical decision making (see chart for details).   1. Influenza like illness Symptoms and physical exam consistent with a viral upper respiratory tract infection that will likely resolve with rest, fluids, and prescriptions for symptomatic relief. No indication for imaging today based on stable cardiopulmonary exam and hemodynamically stable vital signs.  Influenza and strep testing in clinic is negative.  COVID-19 and influenza PCR testing is pending.  We will call patient if this is positive.  Quarantine guidelines discussed. Currently on day 2 of symptoms and does qualify for antiviral therapy.   Patient given ibuprofen 600 mg in clinic today for fever and body aches.   May use Mucinex 1200 mg every 12 hours as needed for nasal congestion.   Advised patient to attempt to stay well-hydrated by drinking at least 64 ounces of water per day.  She does not currently appear to be dehydrated and is nontoxic in appearance.   May use ibuprofen/tylenol over the counter for body aches, fever/chills, and overall discomfort associated with viral illness. Nonpharmacologic interventions for symptom relief provided and after visit summary below.   Strict ED/urgent care return precautions given.  Patient verbalizes understanding and agreement with plan.  Counseled patient regarding possible side effects and uses of all medications prescribed at  today's visit.  Patient verbalizes understanding and agreement with plan.  All questions answered.  Patient discharged from urgent care in stable condition.        Final Clinical Impressions(s) / UC Diagnoses   Final diagnoses:  Influenza-like illness     Discharge Instructions      You have a viral upper respiratory infection.  COVID-19 and flu testing is pending. We will call you with results if positive. If your COVID test is positive, you must stay at home until day 6 of symptoms. On day 6, you may go out into  public and go back to work, but you must wear a mask until day 11 of symptoms to prevent spread to others.  Take guaifenesin 1200mg   every 12 hours to thin your mucous so that you can get it out of your body easier with coughing/blowing your nose. Drink plenty of water while taking this medication so that it works well in your body (at least 8 cups a day).   You may take tylenol 1,000mg  and ibuprofen 600mg  every 6 hours with food as needed for fever/chills, sore throat, aches/pains, and inflammation associated with viral illness. Take this with food to avoid stomach upset.    You may do salt water and baking soda gargles every 4 hours as needed for your throat pain.  Please put 1 teaspoon of salt and 1/2 teaspoon of baking soda in 8 ounces of warm water then gargle and spit the water out. You may also put 1 tablespoon of honey in warm water and drink this to soothe your throat.  Place a humidifier in your room at night to help decrease dry air that can irritate your airway and cause you to have a sore throat and cough.  Please try to eat a well-balanced diet while you are sick so that your body gets proper nutrition to heal.  If you develop any new or worsening symptoms, please return.  If your symptoms are severe, please go to the emergency room.  Follow-up with your primary care provider for further evaluation and management of your symptoms as well as ongoing wellness visits.  I hope you feel better!    ED Prescriptions   None    PDMP not reviewed this encounter.   , 03/06/22 1446

## 2022-03-07 ENCOUNTER — Telehealth (HOSPITAL_COMMUNITY): Payer: Self-pay | Admitting: Emergency Medicine

## 2022-03-07 MED ORDER — NIRMATRELVIR/RITONAVIR (PAXLOVID)TABLET: 3.0000 | ORAL_TABLET | Freq: Two times a day (BID) | ORAL | 0 refills | Status: DC

## 2022-03-14 ENCOUNTER — Encounter (HOSPITAL_COMMUNITY): Payer: Self-pay | Admitting: Obstetrics and Gynecology

## 2022-03-14 NOTE — Progress Notes (Signed)
PCP - none Cardiologist - n/a  Chest x-ray - n/a EKG - n/a Stress Test - 08/18/14 ECHO - 07/06/14 Cardiac Cath - n/a  ICD Pacemaker/Loop - n/a  Sleep Study -  n/a CPAP - none  Anesthesia review: Yes  STOP now taking any Aspirin (unless otherwise instructed by your surgeon), Aleve, Naproxen, Ibuprofen, Motrin, Advil, Goody's, BC's, all herbal medications, fish oil, and all vitamins.   Coronavirus Screening Positive Covid on 03/06/22.  No current symptoms. Do you have any of the following symptoms:  Cough yes/no: No Fever (>100.70F)  yes/no: No Runny nose yes/no: No Sore throat yes/no: No Difficulty breathing/shortness of breath  yes/no: No  Have you traveled in the last 14 days and where? yes/no: No  Patient verbalized understanding of instructions that were given via phone.

## 2022-03-17 ENCOUNTER — Encounter (HOSPITAL_COMMUNITY): Payer: Self-pay | Admitting: Obstetrics and Gynecology

## 2022-03-17 NOTE — Progress Notes (Signed)
Anesthesia Chart Review: Holly Brown  Case: [id number] Date/Time: 03/18/22 1415   Procedure: LOOP ELECTROSURGICAL EXCISION PROCEDURE (LEEP)   Anesthesia type: Choice   Pre-op diagnosis: HGSIL   [facility] ROOM 02 / [facility]   Surgeons: Constant, Peggy, MD       DISCUSSION: Patient is a 36 year old female scheduled for the above procedure.   History includes former smoker, single seizure (04/2014, evaluated by neurologist Joycelyn Schmid, MD with normal brain MRI and EEG), gestational diabetes, migraines, tubal ligation (12/08/18). + Marijuana use.   + COVID-19 03/06/22. No current symptoms per PAT RN phone interview.   She is > 10 days out from COVID-19 and denied current symptoms. She is a same day work-up, so labs and anesthesia team evaluation on the day of surgery.   VS: Ht 5' 10.5" (1.791 m)   Wt 98.4 kg   LMP 03/08/2022 (Exact Date)   BMI 30.69 kg/m  BP Readings from Last 3 Encounters:  03/06/22 99/67  02/25/22 108/67  12/19/21 129/85   Pulse Readings from Last 3 Encounters:  03/06/22 (!) 101  02/25/22 91  12/19/21 65      PROVIDERS: Patient, No Pcp Per She is not followed routinely by cardiology but had an evaluation by Armanda Magic, MD and 2016 following seizure-like activity.  She underwent ETT, echocardiogram, and event monitor--see CV below for results.   LABS: For day of surgery as indicated. Last results in [facility] include: Lab Results  Component Value Date   WBC 10.2 09/23/2021   HGB 13.3 09/23/2021   HCT 40.3 09/23/2021   PLT 461 (H) 09/23/2021   GLUCOSE 138 (H) 09/23/2021   ALT 26 09/23/2021   AST 23 09/23/2021   NA 137 09/23/2021   K 3.3 (L) 09/23/2021   CL 108 09/23/2021   CREATININE 0.97 09/23/2021   BUN 10 09/23/2021   CO2 16 (L) 09/23/2021   TSH 1.190 10/10/2015     IMAGES/OTHER: CT Abd/pelvis 09/23/21: IMPRESSION: 1. Long segment mild colonic wall thickening, predominantly involving the descending colon and distal transverse  colon, suggestive of a mild colitis. 2. Normal appendix.   MRI Brain 04/13/15: IMPRESSION: Normal MRI brain (without). No change from MRI on 06/22/14.    EEG 05/30/14: IMPRESSION: Normal EEG in the awake state.   EKG: Last EKG noted 05/07/14: SR, PAC.   CV: ETT 08/18/14: ETT Interpretation:  normal - no evidence of ischemia by ST analysis Comments: Good exercise capacity. No chest pain. Normal BP response to exercise. No ST changes to suggest ischemia.     Event monitor 07/08/14-08/06/14: SR, ST, PACs. Per 08/21/14 telephone encounters by Dr. Armanda Magic, "heart monitor showed NSR with an episode of atrial fibrillation with controlled VR...2D echo was normal.  CHADS2VASC score is 1 (female) so no anticoagulation needed."     Echo 07/06/14: Study Conclusions  - Left ventricle: The cavity size was normal. Wall thickness was    normal. Systolic function was normal. The estimated ejection    fraction was in the range of 55% to 60%. Wall motion was normal;    there were no regional wall motion abnormalities. Left    ventricular diastolic function parameters were normal.  - Mitral valve: Borderline thickened leaflets . There was trivial    regurgitation.  - Left atrium: The atrium was normal in size.  - Tricuspid valve: There was trivial regurgitation.  - Pulmonary arteries: PA peak pressure: 26 mm Hg (S).  - Inferior vena cava: The  vessel was normal in size. The    respirophasic diameter changes were in the normal range (>= 50%),    consistent with normal central venous pressure.  Impressions:  - Normal study.    Past Medical History:  Diagnosis Date   Gestational diabetes    metformin - resolved after delivery   History of UTI    HSV (herpes simplex virus) infection    Migraines    Seizures (HCC)    Jan.2017 - only had one seizure, none since   Vaginal Pap smear, abnormal     Past Surgical History:  Procedure Laterality Date   DILATION AND CURETTAGE OF UTERUS      LAPAROSCOPIC TUBAL LIGATION Bilateral 12/08/2018   Procedure: LAPAROSCOPIC TUBAL LIGATION;  Surgeon: Catalina Antigua, MD;  Location: [facility];  Service: Gynecology;  Laterality: Bilateral;   WISDOM TOOTH EXTRACTION  2011    MEDICATIONS: No current facility-administered medications for this encounter.    Multiple Vitamins-Minerals (MULTIVITAMIN WITH MINERALS) tablet   ibuprofen (ADVIL) 800 MG tablet   medroxyPROGESTERone (DEPO-PROVERA) 150 MG/ML injection   metroNIDAZOLE (FLAGYL) 500 MG tablet    Shonna Chock, PA-C Surgical Short Stay/Anesthesiology [facility] Phone [phone] [facility] Phone [phone] 03/17/2022 12:27 PM

## 2022-03-17 NOTE — Anesthesia Preprocedure Evaluation (Signed)
Anesthesia Evaluation  Patient identified by MRN, date of birth, ID band Patient awake    Reviewed: Allergy & Precautions, H&P , NPO status , Patient's Chart, lab work & pertinent test results  Airway Mallampati: II   Neck ROM: full    Dental   Pulmonary Patient abstained from smoking., former smoker   breath sounds clear to auscultation       Cardiovascular negative cardio ROS  Rhythm:regular Rate:Normal     Neuro/Psych  Headaches, Seizures -,     GI/Hepatic   Endo/Other    Renal/GU      Musculoskeletal   Abdominal   Peds  Hematology   Anesthesia Other Findings   Reproductive/Obstetrics                             Anesthesia Physical Anesthesia Plan  ASA: 2  Anesthesia Plan: General   Post-op Pain Management:    Induction: Intravenous  PONV Risk Score and Plan: 3 and Ondansetron, Dexamethasone, Midazolam and Treatment may vary due to age or medical condition  Airway Management Planned: LMA  Additional Equipment:   Intra-op Plan:   Post-operative Plan: Extubation in OR  Informed Consent: I have reviewed the patients History and Physical, chart, labs and discussed the procedure including the risks, benefits and alternatives for the proposed anesthesia with the patient or authorized representative who has indicated his/her understanding and acceptance.     Dental advisory given  Plan Discussed with: CRNA, Anesthesiologist and Surgeon  Anesthesia Plan Comments: (PAT note written 03/17/2022 by Shonna Chock, PA-C.  )       Anesthesia Quick Evaluation

## 2022-03-18 ENCOUNTER — Ambulatory Visit (HOSPITAL_COMMUNITY): Payer: No Typology Code available for payment source | Admitting: Vascular Surgery

## 2022-03-18 ENCOUNTER — Encounter (HOSPITAL_COMMUNITY): Payer: Self-pay | Admitting: Obstetrics and Gynecology

## 2022-03-18 ENCOUNTER — Encounter (HOSPITAL_COMMUNITY)
Admission: RE | Disposition: A | Discharge status: Home / Self Care | Payer: Self-pay | Source: Home / Self Care | Attending: Obstetrics and Gynecology

## 2022-03-18 ENCOUNTER — Ambulatory Visit (HOSPITAL_BASED_OUTPATIENT_CLINIC_OR_DEPARTMENT_OTHER): Payer: No Typology Code available for payment source | Admitting: Vascular Surgery

## 2022-03-18 ENCOUNTER — Other Ambulatory Visit: Payer: Self-pay

## 2022-03-18 ENCOUNTER — Ambulatory Visit (HOSPITAL_COMMUNITY)
Admission: RE | Admit: 2022-03-18 | Discharge: 2022-03-18 | Disposition: A | Discharge status: Home / Self Care | Payer: No Typology Code available for payment source | Attending: Obstetrics and Gynecology | Admitting: Obstetrics and Gynecology

## 2022-03-18 DIAGNOSIS — N871 Moderate cervical dysplasia: Secondary | ICD-10-CM | POA: Diagnosis not present

## 2022-03-18 DIAGNOSIS — Z01812 Encounter for preprocedural laboratory examination: Secondary | ICD-10-CM

## 2022-03-18 DIAGNOSIS — D069 Carcinoma in situ of cervix, unspecified: Secondary | ICD-10-CM | POA: Insufficient documentation

## 2022-03-18 DIAGNOSIS — R87623 High grade squamous intraepithelial lesion on cytologic smear of vagina (HGSIL): Secondary | ICD-10-CM

## 2022-03-18 DIAGNOSIS — R87613 High grade squamous intraepithelial lesion on cytologic smear of cervix (HGSIL): Secondary | ICD-10-CM | POA: Diagnosis not present

## 2022-03-18 DIAGNOSIS — Z87891 Personal history of nicotine dependence: Secondary | ICD-10-CM | POA: Diagnosis not present

## 2022-03-18 HISTORY — DX: Herpesviral infection, unspecified: B00.9

## 2022-03-18 HISTORY — PX: LEEP: SHX91

## 2022-03-18 LAB — CBC
HCT: 39.6 % (ref 36.0–46.0)
Hemoglobin: 12.8 g/dL (ref 12.0–15.0)
MCH: 30.4 pg (ref 26.0–34.0)
MCHC: 32.3 g/dL (ref 30.0–36.0)
MCV: 94.1 fL (ref 80.0–100.0)
Platelets: 407 10*3/uL — ABNORMAL HIGH (ref 150–400)
RBC: 4.21 MIL/uL (ref 3.87–5.11)
RDW: 12.8 % (ref 11.5–15.5)
WBC: 8.4 10*3/uL (ref 4.0–10.5)
nRBC: 0 % (ref 0.0–0.2)

## 2022-03-18 LAB — COMPREHENSIVE METABOLIC PANEL
ALT: 12 U/L (ref 0–44)
AST: 15 U/L (ref 15–41)
Albumin: 4 g/dL (ref 3.5–5.0)
Alkaline Phosphatase: 53 U/L (ref 38–126)
Anion gap: 15 (ref 5–15)
BUN: 9 mg/dL (ref 6–20)
CO2: 22 mmol/L (ref 22–32)
Calcium: 9.6 mg/dL (ref 8.9–10.3)
Chloride: 104 mmol/L (ref 98–111)
Creatinine, Ser: 0.89 mg/dL (ref 0.44–1.00)
GFR, Estimated: >60 mL/min (ref >60)
Glucose, Bld: 78 mg/dL (ref 70–99)
Potassium: 3.2 mmol/L — ABNORMAL LOW (ref 3.5–5.1)
Sodium: 141 mmol/L (ref 135–145)
Total Bilirubin: 1.2 mg/dL (ref 0.3–1.2)
Total Protein: 7.1 g/dL (ref 6.5–8.1)

## 2022-03-18 LAB — POCT PREGNANCY, URINE: Preg Test, Ur: NEGATIVE

## 2022-03-18 SURGERY — LEEP (LOOP ELECTROSURGICAL EXCISION PROCEDURE)
Anesthesia: General | Site: Vagina

## 2022-03-18 MED ORDER — OXYCODONE HCL 5 MG PO TABS
5.0000 mg | ORAL_TABLET | Freq: Once | ORAL | Status: AC | PRN
  Administered 2022-03-18: 5 mg via ORAL

## 2022-03-18 MED ORDER — OXYCODONE HCL 5 MG/5ML PO SOLN: 5.0000 mg | Freq: Once | ORAL | Status: AC | PRN

## 2022-03-18 MED ORDER — FENTANYL CITRATE (PF) 100 MCG/2ML IJ SOLN
INTRAMUSCULAR | Status: AC
  Filled 2022-03-18: qty 2

## 2022-03-18 MED ORDER — FERRIC SUBSULFATE 259 MG/GM EX SOLN
CUTANEOUS | Status: AC
  Filled 2022-03-18: qty 8

## 2022-03-18 MED ORDER — PROPOFOL 10 MG/ML IV BOLUS
INTRAVENOUS | Status: AC
  Filled 2022-03-18: qty 20

## 2022-03-18 MED ORDER — LIDOCAINE-EPINEPHRINE 1 %-1:100000 IJ SOLN
INTRAMUSCULAR | Status: AC
  Filled 2022-03-18: qty 1

## 2022-03-18 MED ORDER — FENTANYL CITRATE (PF) 100 MCG/2ML IJ SOLN
25.0000 ug | INTRAMUSCULAR | Status: DC | PRN
  Administered 2022-03-18: 50 ug via INTRAVENOUS

## 2022-03-18 MED ORDER — LIDOCAINE 2% (20 MG/ML) 5 ML SYRINGE
INTRAMUSCULAR | Status: AC
  Filled 2022-03-18: qty 5

## 2022-03-18 MED ORDER — PHENYLEPHRINE 80 MCG/ML (10ML) SYRINGE FOR IV PUSH (FOR BLOOD PRESSURE SUPPORT)
PREFILLED_SYRINGE | INTRAVENOUS | Status: AC
  Filled 2022-03-18: qty 20

## 2022-03-18 MED ORDER — LACTATED RINGERS IV SOLN: INTRAVENOUS | Status: DC

## 2022-03-18 MED ORDER — IODINE STRONG (LUGOLS) 5 % PO SOLN
ORAL | Status: AC
  Filled 2022-03-18: qty 1

## 2022-03-18 MED ORDER — IBUPROFEN 600 MG PO TABS: 600.0000 mg | ORAL_TABLET | Freq: Four times a day (QID) | ORAL | 3 refills | Status: DC | PRN

## 2022-03-18 MED ORDER — FERRIC SUBSULFATE (BULK) SOLN
Status: DC | PRN
  Administered 2022-03-18: 1

## 2022-03-18 MED ORDER — DEXAMETHASONE SODIUM PHOSPHATE 10 MG/ML IJ SOLN
INTRAMUSCULAR | Status: DC | PRN
  Administered 2022-03-18: 10 mg via INTRAVENOUS

## 2022-03-18 MED ORDER — PROPOFOL 10 MG/ML IV BOLUS
INTRAVENOUS | Status: DC | PRN
  Administered 2022-03-18 (×2): 200 mg via INTRAVENOUS

## 2022-03-18 MED ORDER — CHLORHEXIDINE GLUCONATE 0.12 % MT SOLN
15.0000 mL | Freq: Once | OROMUCOSAL | Status: AC
  Administered 2022-03-18: 15 mL via OROMUCOSAL
  Filled 2022-03-18: qty 15

## 2022-03-18 MED ORDER — ROCURONIUM BROMIDE 10 MG/ML (PF) SYRINGE
PREFILLED_SYRINGE | INTRAVENOUS | Status: AC
  Filled 2022-03-18: qty 10

## 2022-03-18 MED ORDER — POVIDONE-IODINE 10 % EX SWAB
2.0000 | Freq: Once | CUTANEOUS | Status: AC
  Administered 2022-03-18: 2 via TOPICAL

## 2022-03-18 MED ORDER — LACTATED RINGERS IV SOLN: INTRAVENOUS | Status: DC | PRN

## 2022-03-18 MED ORDER — ONDANSETRON HCL 4 MG/2ML IJ SOLN
INTRAMUSCULAR | Status: AC
  Filled 2022-03-18: qty 2

## 2022-03-18 MED ORDER — FENTANYL CITRATE (PF) 100 MCG/2ML IJ SOLN
INTRAMUSCULAR | Status: DC | PRN
  Administered 2022-03-18 (×2): 50 ug via INTRAVENOUS

## 2022-03-18 MED ORDER — ORAL CARE MOUTH RINSE: 15.0000 mL | Freq: Once | OROMUCOSAL | Status: AC

## 2022-03-18 MED ORDER — EPHEDRINE 5 MG/ML INJ
INTRAVENOUS | Status: AC
  Filled 2022-03-18: qty 5

## 2022-03-18 MED ORDER — ACETIC ACID 5 % SOLN
Status: AC
  Filled 2022-03-18: qty 500

## 2022-03-18 MED ORDER — ONDANSETRON HCL 4 MG/2ML IJ SOLN: 4.0000 mg | Freq: Four times a day (QID) | INTRAMUSCULAR | Status: DC | PRN

## 2022-03-18 MED ORDER — MIDAZOLAM HCL 2 MG/2ML IJ SOLN
INTRAMUSCULAR | Status: AC
  Filled 2022-03-18: qty 2

## 2022-03-18 MED ORDER — EPHEDRINE SULFATE-NACL 50-0.9 MG/10ML-% IV SOSY
PREFILLED_SYRINGE | INTRAVENOUS | Status: DC | PRN
  Administered 2022-03-18 (×4): 2.5 mg via INTRAVENOUS

## 2022-03-18 MED ORDER — OXYCODONE HCL 5 MG PO TABS
ORAL_TABLET | ORAL | Status: AC
  Filled 2022-03-18: qty 1

## 2022-03-18 MED ORDER — ONDANSETRON HCL 4 MG/2ML IJ SOLN
INTRAMUSCULAR | Status: DC | PRN
  Administered 2022-03-18: 4 mg via INTRAVENOUS

## 2022-03-18 MED ORDER — FENTANYL CITRATE (PF) 250 MCG/5ML IJ SOLN
INTRAMUSCULAR | Status: AC
  Filled 2022-03-18: qty 5

## 2022-03-18 MED ORDER — LIDOCAINE 2% (20 MG/ML) 5 ML SYRINGE
INTRAMUSCULAR | Status: DC | PRN
  Administered 2022-03-18: 80 mg via INTRAVENOUS

## 2022-03-18 MED ORDER — DEXAMETHASONE SODIUM PHOSPHATE 10 MG/ML IJ SOLN
INTRAMUSCULAR | Status: AC
  Filled 2022-03-18: qty 1

## 2022-03-18 MED ORDER — MIDAZOLAM HCL 2 MG/2ML IJ SOLN
INTRAMUSCULAR | Status: DC | PRN
  Administered 2022-03-18 (×2): 1 mg via INTRAVENOUS

## 2022-03-18 SURGICAL SUPPLY — 24 items
APL SWBSTK 6 STRL LF DISP (MISCELLANEOUS) ×1
APPLICATOR COTTON TIP 6 STRL (MISCELLANEOUS) ×2 IMPLANT
APPLICATOR COTTON TIP 6IN STRL (MISCELLANEOUS) ×1
DRSG TELFA 3X8 NADH STRL (GAUZE/BANDAGES/DRESSINGS) IMPLANT
ELECT BALL LEEP 5MM RED (ELECTRODE) IMPLANT
ELECT LOOP LEEP RND 15X12 GRN (CUTTING LOOP) ×1
ELECT LOOP LEEP RND 20X12 WHT (CUTTING LOOP) ×1
ELECT REM PT RETURN 9FT ADLT (ELECTROSURGICAL) ×1
ELECTRODE LOOP LP RND 15X12GRN (CUTTING LOOP) IMPLANT
ELECTRODE LOOP LP RND 20X12WHT (CUTTING LOOP) IMPLANT
ELECTRODE REM PT RTRN 9FT ADLT (ELECTROSURGICAL) ×2 IMPLANT
GLOVE BIOGEL PI IND STRL 6.5 (GLOVE) ×2 IMPLANT
GLOVE BIOGEL PI IND STRL 7.0 (GLOVE) ×2 IMPLANT
GLOVE SURG SS PI 6.5 STRL IVOR (GLOVE) ×2 IMPLANT
GOWN STRL REUS W/ TWL LRG LVL3 (GOWN DISPOSABLE) ×4 IMPLANT
GOWN STRL REUS W/TWL LRG LVL3 (GOWN DISPOSABLE) ×2
KIT TURNOVER KIT B (KITS) ×2 IMPLANT
NS IRRIG 1000ML POUR BTL (IV SOLUTION) ×2 IMPLANT
PAD OB MATERNITY 4.3X12.25 (PERSONAL CARE ITEMS) ×2 IMPLANT
PENCIL BUTTON HOLSTER BLD 10FT (ELECTRODE) IMPLANT
SCOPETTES 8  STERILE (MISCELLANEOUS) ×2
SCOPETTES 8 STERILE (MISCELLANEOUS) ×4 IMPLANT
TOWEL GREEN STERILE FF (TOWEL DISPOSABLE) ×4 IMPLANT
YANKAUER SUCT BULB TIP NO VENT (SUCTIONS) ×2 IMPLANT

## 2022-03-18 NOTE — H&P (Signed)
Holly Brown is an 36 y.o. female here for scheduled LEEP procedure. Patient with history of HGSIL on 10/2020 pap smear, confirmed on cervical biopsies in 04/2021 and again HGSIL on 02/2022 pap smear. Patient opted to have this procedure done under sedation. Patient is without any other complaints   Menstrual History: Patient's last menstrual period was 03/08/2022 (exact date).    Past Medical History:  Diagnosis Date   Gestational diabetes    metformin - resolved after delivery   History of UTI    HSV (herpes simplex virus) infection    Migraines    Seizures (HCC)    Jan.2016 - only had one seizure, none since   Vaginal Pap smear, abnormal     Past Surgical History:  Procedure Laterality Date   DILATION AND CURETTAGE OF UTERUS     LAPAROSCOPIC TUBAL LIGATION Bilateral 12/08/2018   Procedure: LAPAROSCOPIC TUBAL LIGATION;  Surgeon: Catalina Antigua, MD;  Location: [facility];  Service: Gynecology;  Laterality: Bilateral;   WISDOM TOOTH EXTRACTION  2011    Family History  Problem Relation Age of Onset   Colon cancer Father    Hypertension Maternal Grandmother    Diabetes Maternal Grandmother    Hypertension Paternal Grandmother    Diabetes Mother     Social History:  reports that she has quit smoking. Her smoking use included cigarettes. She started smoking about 10 years ago. She smoked an average of .25 packs per day. She has never used smokeless tobacco. She reports that she does not currently use alcohol. She reports current drug use. Drug: Marijuana.  Allergies: No Known Allergies  Medications Prior to Admission  Medication Sig Dispense Refill Last Dose   Multiple Vitamins-Minerals (MULTIVITAMIN WITH MINERALS) tablet Take 1 tablet by mouth daily.      ibuprofen (ADVIL) 800 MG tablet Take 1 tablet (800 mg total) by mouth every 8 (eight) hours as needed. (Patient not taking: Reported on 03/11/2022) 30 tablet 5 Not Taking   medroxyPROGESTERone  (DEPO-PROVERA) 150 MG/ML injection Inject 1 mL (150 mg total) into the muscle every 3 (three) months. (Patient not taking: Reported on 12/23/2018) 1 mL 3    metroNIDAZOLE (FLAGYL) 500 MG tablet Take 1 tablet (500 mg total) by mouth 2 (two) times daily. (Patient not taking: Reported on 03/11/2022) 14 tablet 2 Not Taking    Review of Systems See pertinent in HPI. All other systems reviewed and non contributory Blood pressure (!) 143/68, pulse 60, temperature 98 F (36.7 C), temperature source Oral, resp. rate 20, height 5\' 10"  (1.778 m), weight 102.1 kg, last menstrual period 03/08/2022, SpO2 99 %. Physical Exam GENERAL: Well-developed, well-nourished female in no acute distress.  LUNGS: Clear to auscultation bilaterally.  HEART: Regular rate and rhythm. ABDOMEN: Soft, nontender, nondistended. No organomegaly. PELVIC: Deferred to OR EXTREMITIES: No cyanosis, clubbing, or edema, 2+ distal pulses.  No results found for this or any previous visit (from the past 24 hour(s)).  No results found.  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 Patient verbalized understanding and all questions were answered    03/18/2022, 12:31 PM

## 2022-03-18 NOTE — Op Note (Signed)
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 PROCEDURE:     Cervical cone biopsy SURGEON:  Dr. Catalina Antigua  INDICATIONS: 36 y.o. 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  Risks of surgery were discussed with the patient including but not limited to: bleeding which may require transfusion; infection which may require antibiotics; injury to uterus or surrounding organs;need for additional procedures including laparotomy or laparoscopy; possibility of intrauterine scarring which may impair future fertility; and other postoperative/anesthesia complications. Written informed consent was obtained.    FINDINGS:  An 8-week size anteverted uterus,   ANESTHESIA:    Monitored intravenous sedation INTRAVENOUS FLUIDS:  700 ml of LR ESTIMATED BLOOD LOSS:  Less than 20 ml. SPECIMENS:  cervical cone biopsy sent to pathology COMPLICATIONS:  None immediate.  PROCEDURE DETAILS:  The patient was taken to the operating room where general anesthesia was administered and was found to be adequate.  After an adequate timeout was performed, she was placed in the dorsal lithotomy position and examined; then prepped and draped in the sterile manner.   A Teflon coated speculum with smoke evacuator placed.  Cervix visualized. A medium size LOOP used to remove cone of cervix using blend of cut and cautery on LEEP machine.  Edges/Base cauterized with Ball.  Monsel's solution used for hemostasis.  Patient tolerated procedure well. There was minimal bleeding noted.   All instruments were removed from the patient's vagina. The patient tolerated the procedure well and was taken to the recovery area awake, and in stable condition.  The patient will be discharged to home as per PACU criteria.  Routine postoperative instructions given.  She will follow up in the clinic in 2 weeks for postoperative  evaluation.

## 2022-03-18 NOTE — Transfer of Care (Signed)
Immediate Anesthesia Transfer of Care Note  Patient: Holly Brown  Procedure(s) Performed: LOOP ELECTROSURGICAL EXCISION PROCEDURE (LEEP) (Vagina )  Patient Location: PACU  Anesthesia Type:General  Level of Consciousness: drowsy and patient cooperative  Airway & Oxygen Therapy: Patient Spontanous Breathing and Patient connected to face mask oxygen  Post-op Assessment: Report given to RN and Post -op Vital signs reviewed and stable  Post vital signs: Reviewed and stable  Last Vitals:  Vitals Value Taken Time  BP 110/64 03/18/22 1616  Temp    Pulse 88 03/18/22 1620  Resp 13 03/18/22 1620  SpO2 100 % 03/18/22 1620  Vitals shown include unvalidated device data.  Last Pain:  Vitals:   03/18/22 1252  TempSrc:   PainSc: 0-No pain         Complications: No notable events documented.

## 2022-03-18 NOTE — Anesthesia Procedure Notes (Signed)
Procedure Name: LMA Insertion Date/Time: 03/18/2022 3:38 PM  Performed by: Audie Pinto, CRNAPre-anesthesia Checklist: Patient identified, Emergency Drugs available, Suction available and Patient being monitored Patient Re-evaluated:Patient Re-evaluated prior to induction Oxygen Delivery Method: Circle system utilized Preoxygenation: Pre-oxygenation with 100% oxygen Induction Type: IV induction Ventilation: Mask ventilation without difficulty LMA: LMA inserted LMA Size: 4.0 Placement Confirmation: positive ETCO2 Dental Injury: Teeth and Oropharynx as per pre-operative assessment

## 2022-03-19 ENCOUNTER — Encounter (HOSPITAL_COMMUNITY): Payer: Self-pay | Admitting: Obstetrics and Gynecology

## 2022-03-19 ENCOUNTER — Other Ambulatory Visit (HOSPITAL_COMMUNITY): Payer: Self-pay

## 2022-03-19 LAB — TYPE AND SCREEN
ABO/RH(D): A POS
Antibody Screen: NEGATIVE

## 2022-03-19 NOTE — Anesthesia Postprocedure Evaluation (Signed)
Anesthesia Post Note  Patient: [name]  Procedure(s) Performed: LOOP ELECTROSURGICAL EXCISION PROCEDURE (LEEP) (Vagina )     Patient location during evaluation: PACU Anesthesia Type: General Level of consciousness: awake and alert Pain management: pain level controlled Vital Signs Assessment: post-procedure vital signs reviewed and stable Respiratory status: spontaneous breathing, nonlabored ventilation and respiratory function stable Cardiovascular status: blood pressure returned to baseline and stable Postop Assessment: no apparent nausea or vomiting Anesthetic complications: no   No notable events documented.  Last Vitals:  Vitals:   03/18/22 1635 03/18/22 1650  BP: 118/68 120/61  Pulse: (!) 54 74  Resp: 12 14  Temp:  36.9 C  SpO2: 98% 100%    Last Pain:  Vitals:   03/18/22 1650  TempSrc:   PainSc: 4                  Lowella Curb

## 2022-03-20 LAB — SURGICAL PATHOLOGY

## 2022-03-23 ENCOUNTER — Inpatient Hospital Stay (HOSPITAL_COMMUNITY)
Admission: AD | Admit: 2022-03-23 | Discharge: 2022-03-23 | Disposition: A | Discharge status: Home / Self Care | Payer: No Typology Code available for payment source | Source: Ambulatory Visit | Attending: Obstetrics and Gynecology | Admitting: Obstetrics and Gynecology

## 2022-03-23 ENCOUNTER — Encounter (HOSPITAL_COMMUNITY): Payer: Self-pay | Admitting: Obstetrics and Gynecology

## 2022-03-23 DIAGNOSIS — Z9889 Other specified postprocedural states: Secondary | ICD-10-CM | POA: Diagnosis not present

## 2022-03-23 DIAGNOSIS — N898 Other specified noninflammatory disorders of vagina: Secondary | ICD-10-CM | POA: Diagnosis not present

## 2022-03-23 DIAGNOSIS — R109 Unspecified abdominal pain: Secondary | ICD-10-CM | POA: Insufficient documentation

## 2022-03-23 MED ORDER — KETOROLAC TROMETHAMINE 10 MG PO TABS
10.0000 mg | ORAL_TABLET | Freq: Once | ORAL | Status: AC
  Administered 2022-03-23: 10 mg via ORAL
  Filled 2022-03-23: qty 1

## 2022-03-23 NOTE — MAU Note (Signed)
..  Holly Brown is a 36 y.o. at Unknown here in MAU reporting: She had a procedure on Tuesday and yesterday she noticed black discharge with foul odor and having lower abdominal cramping. Has not taken any medication, was instructed to come into MAU by the nurse line.  Pain score: 10/10 Vitals:   03/23/22 1957  BP: 130/64  Pulse: 88  Resp: 16  Temp: 98.2 F (36.8 C)  SpO2: 100%     FHT:not indicated Lab orders placed from triage:  none

## 2022-03-23 NOTE — MAU Provider Note (Signed)
History     CSN: [id number]  Arrival date and time: 03/23/22 2542   Event Date/Time   First Provider Initiated Contact with Patient 03/23/22 2023      Chief Complaint  Patient presents with   Abdominal Pain   HPI Holly Brown is a 36 y.o. [id number] non-pregnant patient who presents to MAU with chief complaint of black vaginal discharge. She is s/p LEEP on Tuesday 03/18/2022. Her abnormal discharge was first noted yesterday 03/22/2022. Discharge is clumped, black and foul-smelling.  Patient also c/o abdominal cramping; This is a new problem, uncertain onset. Pain on arrival to MAU is 10/10. She has not taken medication for this complaint. She denies dysuria, abdominal tenderness, fever or recent illness.  Patient receives care with [facility]  Pertinent Gynecological History: Previous GYN Procedures:  LEEP 03/18/2022   Last pap: abnormal: HSIL  Date: 02/25/2022  Past Medical History:  Diagnosis Date   Gestational diabetes    metformin - resolved after delivery   History of UTI    HSV (herpes simplex virus) infection    Migraines    Seizures (HCC)    Jan.2016 - only had one seizure, none since   Vaginal Pap smear, abnormal     Past Surgical History:  Procedure Laterality Date   DILATION AND CURETTAGE OF UTERUS     LAPAROSCOPIC TUBAL LIGATION Bilateral 12/08/2018   Procedure: LAPAROSCOPIC TUBAL LIGATION;  Surgeon: Catalina Antigua, MD;  Location: [facility];  Service: Gynecology;  Laterality: Bilateral;   LEEP N/A 03/18/2022   Procedure: LOOP ELECTROSURGICAL EXCISION PROCEDURE (LEEP);  Surgeon: Catalina Antigua, MD;  Location[facility];  Service: Gynecology;  Laterality: N/A;   WISDOM TOOTH EXTRACTION  2011    Family History  Problem Relation Age of Onset   Colon cancer Father    Hypertension Maternal Grandmother    Diabetes Maternal Grandmother    Hypertension Paternal Grandmother    Diabetes Mother     Social History   Tobacco Use   Smoking status:  Former    Packs/day: 0.25    Types: Cigarettes    Start date: 12/21/2011   Smokeless tobacco: Never  Vaping Use   Vaping Use: Never used  Substance Use Topics   Alcohol use: Not Currently    Alcohol/week: 0.0 standard drinks of alcohol    Comment: ocassionally    Drug use: Yes    Types: Marijuana    Comment: Last use was on  03/13/22    Allergies: No Known Allergies  Medications Prior to Admission  Medication Sig Dispense Refill Last Dose   Multiple Vitamins-Minerals (MULTIVITAMIN WITH MINERALS) tablet Take 1 tablet by mouth daily.   03/23/2022   ibuprofen (ADVIL) 600 MG tablet Take 1 tablet (600 mg total) by mouth every 6 (six) hours as needed. 60 tablet 3     Review of Systems  Gastrointestinal:  Positive for abdominal pain.  Genitourinary:  Positive for vaginal discharge.  All other systems reviewed and are negative.  Physical Exam   Blood pressure 136/79, pulse 74, temperature 98.2 F (36.8 C), temperature source Oral, resp. rate 16, height 5\' 10"  (1.778 m), weight 99.4 kg, last menstrual period 03/08/2022, SpO2 100 %.  Physical Exam Vitals and nursing note reviewed. Exam conducted with a chaperone present.  Constitutional:      Appearance: She is well-developed. She is not ill-appearing.  Cardiovascular:     Rate and Rhythm: Normal rate and regular rhythm.     Heart sounds: Normal heart sounds.  Pulmonary:     Effort: Pulmonary effort is normal.  Abdominal:     General: Abdomen is flat.     Palpations: Abdomen is soft.     Tenderness: There is no abdominal tenderness.  Genitourinary:    Comments: Pelvic exam: External genitalia normal, vaginal walls pink and well rugated, cervix visually closed, no lesions noted. Posterior vault and surface of cervix contain small clusters of Monsel's solution   Neurological:     Mental Status: She is alert.     MAU Course  Procedures  MDM Orders Placed This Encounter  Procedures   Discharge patient   Meds ordered  this encounter  Medications   ketorolac (TORADOL) tablet 10 mg   Patient Vitals for the past 24 hrs:  BP Temp Temp src Pulse Resp SpO2 Height Weight  03/23/22 2130 118/60 -- -- 63 -- -- -- --  03/23/22 2014 136/79 -- -- 74 -- -- -- --  03/23/22 1957 130/64 98.2 F (36.8 C) Oral 88 16 100 % 5\' 10"  (1.778 m) 99.4 kg     Assessment and Plan  --36 y.o. 20 well-appearing patient s/p LEEP 6 days ago --Monsel's solution on pelvic exam --No other concerns findings --Advised Motrin as needed for cramping --Discharge home in stable condition  F/U: --Patient has post-op scheduled on 04/03/2022  04/05/2022, MSA, MSN, CNM

## 2022-03-24 ENCOUNTER — Other Ambulatory Visit: Payer: Self-pay | Admitting: Obstetrics and Gynecology

## 2022-03-24 ENCOUNTER — Telehealth: Payer: Self-pay | Admitting: *Deleted

## 2022-03-24 MED ORDER — METRONIDAZOLE 500 MG PO TABS: 500.0000 mg | ORAL_TABLET | Freq: Two times a day (BID) | ORAL | 0 refills | Status: DC

## 2022-03-24 NOTE — Telephone Encounter (Signed)
TC from pt asking for LEEP results. Also reports cont'd vaginal discharge that soaks a pad and smells foul. Advised pt that provider would go over results at post op appt. Reassured pt that if any urgent actions were needed Dr. Jolayne Panther would have let her know. Consulted Dr. Jolayne Panther regarding c/o discharge. RX Flagyl sent.

## 2022-03-25 ENCOUNTER — Encounter: Payer: No Typology Code available for payment source | Admitting: Obstetrics and Gynecology

## 2022-04-03 ENCOUNTER — Ambulatory Visit (INDEPENDENT_AMBULATORY_CARE_PROVIDER_SITE_OTHER): Payer: No Typology Code available for payment source | Admitting: Obstetrics and Gynecology

## 2022-04-03 ENCOUNTER — Encounter: Payer: Self-pay | Admitting: Obstetrics and Gynecology

## 2022-04-03 VITALS — BP 130/86 | HR 86 | Wt 221.0 lb

## 2022-04-03 DIAGNOSIS — Z9889 Other specified postprocedural states: Secondary | ICD-10-CM

## 2022-04-03 NOTE — Progress Notes (Signed)
36 yo here for post op check following LEEP procedure on 03/18/22. Patient reports feeling well since the procedure. Reporting minimal vaginal bleeding. Vaginal odor stopped following initiation of Flagyl. Patient started her cycle  Past Medical History:  Diagnosis Date   Gestational diabetes    metformin - resolved after delivery   History of UTI    HSV (herpes simplex virus) infection    Migraines    Seizures (HCC)    Jan.2016 - only had one seizure, none since   Vaginal Pap smear, abnormal    Past Surgical History:  Procedure Laterality Date   DILATION AND CURETTAGE OF UTERUS     LAPAROSCOPIC TUBAL LIGATION Bilateral 12/08/2018   Procedure: LAPAROSCOPIC TUBAL LIGATION;  Surgeon: Catalina Antigua, MD;  Location: [facility];  Service: Gynecology;  Laterality: Bilateral;   LEEP N/A 03/18/2022   Procedure: LOOP ELECTROSURGICAL EXCISION PROCEDURE (LEEP);  Surgeon: Catalina Antigua, MD;  [facility];  Service: Gynecology;  Laterality: N/A;   WISDOM TOOTH EXTRACTION  2011   Family History  Problem Relation Age of Onset   Colon cancer Father    Hypertension Maternal Grandmother    Diabetes Maternal Grandmother    Hypertension Paternal Grandmother    Diabetes Mother    Social History   Tobacco Use   Smoking status: Former    Packs/day: 0.25    Types: Cigarettes    Start date: 12/21/2011   Smokeless tobacco: Never  Vaping Use   Vaping Use: Never used  Substance Use Topics   Alcohol use: Not Currently    Alcohol/week: 0.0 standard drinks of alcohol    Comment: ocassionally    Drug use: Yes    Types: Marijuana    Comment: Last use was on  03/13/22   ROS See pertinent in HPI. All other systems reviewed and non contributory Blood pressure 130/86, pulse 86, weight 221 lb (100.2 kg). GENERAL: Well-developed, well-nourished female in no acute distress.  ABDOMEN: Soft, nontender, nondistended. No organomegaly. PELVIC: Normal external female genitalia. Vagina is  pink and rugated.  Normal discharge. Normal appearing cervix, healing well. Chaperone present during the pelvic exam EXTREMITIES: No cyanosis, clubbing, or edema, 2+ distal pulses.  A/P 36 yo here for post op check - Pathology results reviewed with the patient - Discussed benefits of Gardasil vaccine if not already received - Plan for repeat pap smear in 1 year

## 2022-05-31 ENCOUNTER — Telehealth: Payer: 59 | Admitting: Nurse Practitioner

## 2022-05-31 DIAGNOSIS — A6004 Herpesviral vulvovaginitis: Secondary | ICD-10-CM | POA: Diagnosis not present

## 2022-05-31 MED ORDER — VALACYCLOVIR HCL 500 MG PO TABS: 500.0000 mg | ORAL_TABLET | Freq: Two times a day (BID) | ORAL | 0 refills | Status: DC

## 2022-05-31 NOTE — Progress Notes (Signed)
E-Visit for Herpes Simplex  We are sorry that you are not feeling well.  Here is how we plan to help!  Based on what you have shared ith me, it looks like you may be having an outbreak/flare-up of genital herpes.    I have prescribed I have prescribed Valacyclovir 500 mg Take one by mouth twice a day for 3 days.    If you have been prescribed long term medications to be taken on a regular basis, it is important to follow the recommendations and take them as ordered.    Outbreaks usually include blisters and open sores in the genital area. Outbreaks that happen after the first time are usually not as severe and do not last as long. Genital Herpes Simplex is a commonly sexually transmitted viral infection that is found worldwide. Most of these genital infections are caused by one or two herpes simplex viruses that is passed from person to person during vaginal, oral, or anal sex. Sometimes, people do not know they have herpes because they do not have any symptoms.  Please be aware that if you have genital herpes you can be contagious even when you are not having rash or flare-up and you may not have any symptoms, even when you are taking suppressive medicines.  Herpes cannot be cured. The disease usually causes most problems during the first few years. After that, the virus is still there, but it causes few to no symptoms. Even when the virus is active, people with herpes can take medicines to reduce and help prevent symptoms.  Herpes is an infection that can cause blisters and open sores on the genital area. Herpes is caused by a virus that is passed from person to person during vaginal, oral, or anal sex. Sometimes, people do not know they have herpes because they do not have any symptoms. Herpes cannot be cured. The disease usually causes most problems during the first few years. After that, the virus is still there, but it causes few to no symptoms. Even when the virus is active, people with herpes  can take medicines to reduce and help prevent symptoms.  If you have been prescribed medications to be taken on a regular basis, it is important to follow the recommendations and take them as ordered.  Some people with herpes never have any symptoms. But other people can develop symptoms within a few weeks of being infected with the herpes virus   Symptoms usually include blisters in the genital area. In women, this area includes the vagina, buttocks, anus, or thighs. In men, this area includes the penis, scrotum, anus, butt, or thighs. The blisters can become painful open sores, which then crust over as they heal. Sometimes, people can have other symptoms that include:  ?Blisters on the mouth or lips ?Fever, headache, or pain in the joints ?Trouble urinating  Outbreaks might occur every month or more often, or just once or twice a year. Sometimes, people can tell when an outbreak will occur, because they feel itching or pain beforehand. Sometimes they do not know that an outbreak is coming because they have no symptoms. Whatever your pattern is, keep in mind that herpes outbreaks usually become less frequent over time as you get older. Certain things, called "triggers," can make outbreaks more likely to occur. These include stress, sunlight, menstrual periods,or getting sick.  Antiviral therapy can shorten the duration of symptoms and signs in primary infection, which, when untreated, can be associated with significant increase in the  symptoms of the disease.  HOME CARE Use a portable bath (such as a "Sitz bath") where you can sit in warm water for about 20 minutes. Your bathtub could also work. Avoid bubble baths.  Keep the genital area clean and dry and avoid tight clothes.  Take over-the-counter pain medicine such as acetaminophen (brand name: Tylenol) or ibuprofen sample brand names: Advil, Motrin). But avoid aspirin.  Only take medications as instructed by your medical team.  You are  most likely to spread herpes to a sex partner when you have blisters and open sores on your body. But it's also possible to spread herpes to your partner when you do not have any symptoms. That is because herpes can be present on your body without causing any symptoms, like blisters or pain.  Telling your sex partner that you have herpes can be hard. But it can help protect them, since there are ways to lower the risk of spreading the infection.   Using a condom every time you have sex  Not having sex when you have symptoms  Not having oral sex if you have blisters or open sores (in the genital area or around your mouth)  MAKE SURE YOU   Understand these instructions. Do not have sex without using a condom until you have been seen by a doctor and as instructed by the provider If you are not better or improved within 7 days, you MUST have a follow up at your doctor or the health department for evaluation. There are other causes of rashes in the genital region.  Thank you for choosing an e-visit.  Your e-visit answers were reviewed by a board certified advanced clinical practitioner to complete your personal care plan. Depending upon the condition, your plan could have included both over the counter or prescription medications.  Please review your pharmacy choice. Make sure the pharmacy is open so you can pick up prescription now. If there is a problem, you may contact your provider through [employer] and have the prescription routed to another pharmacy.  Your safety is important to [place]. If you have drug allergies check your prescription carefully.   For the next 24 hours you can use MyChart to ask questions about today's visit, request a non-urgent call back, or ask for a work or school excuse. You will get an email in the next two days asking about your experience. I hope that your e-visit has been valuable and will speed your recovery.    Mary-Margaret Hassell Done, FNP   5-10 minutes  spent reviewing and documenting in chart.

## 2022-07-09 ENCOUNTER — Ambulatory Visit (INDEPENDENT_AMBULATORY_CARE_PROVIDER_SITE_OTHER): Payer: 59

## 2022-07-09 ENCOUNTER — Ambulatory Visit: Payer: 59

## 2022-07-09 ENCOUNTER — Other Ambulatory Visit (HOSPITAL_COMMUNITY)
Admission: RE | Admit: 2022-07-09 | Discharge: 2022-07-09 | Disposition: A | Discharge status: Home / Self Care | Payer: 59 | Source: Ambulatory Visit | Attending: Obstetrics and Gynecology | Admitting: Obstetrics and Gynecology

## 2022-07-09 VITALS — BP 121/85 | HR 67 | Wt 224.0 lb

## 2022-07-09 DIAGNOSIS — Z113 Encounter for screening for infections with a predominantly sexual mode of transmission: Secondary | ICD-10-CM

## 2022-07-09 DIAGNOSIS — N898 Other specified noninflammatory disorders of vagina: Secondary | ICD-10-CM

## 2022-07-09 NOTE — Progress Notes (Signed)
SUBJECTIVE:  37 y.o. female who desires a STI screen. Notes abnormal vaginal discharge, no bleeding or significant pelvic pain. No UTI symptoms. Denies history of known exposure to STD.  No LMP recorded.  OBJECTIVE:  She appears well.   ASSESSMENT:  STI Screen  Pt rather treatment not be a pill if possible pt advised when results do come back depending on what results read will determine method of treatment pt advised to call and speak back with clinical staff once results are back.   PLAN:  Pt offered STI blood screening-declined GC, chlamydia, and trichomonas, BV and yeast as well due to vaginal discharge. probe sent to lab.  Treatment: To be determined once lab results are received.  Pt follow up as needed.

## 2022-07-10 LAB — CERVICOVAGINAL ANCILLARY ONLY
Bacterial Vaginitis (gardnerella): POSITIVE — AB
Candida Glabrata: NEGATIVE
Candida Vaginitis: NEGATIVE
Chlamydia: POSITIVE — AB
Comment: NEGATIVE
Comment: NEGATIVE
Comment: NEGATIVE
Comment: NEGATIVE
Comment: NEGATIVE
Comment: NORMAL
Neisseria Gonorrhea: NEGATIVE
Trichomonas: POSITIVE — AB

## 2022-07-11 ENCOUNTER — Emergency Department (HOSPITAL_COMMUNITY)
Admission: EM | Admit: 2022-07-11 | Discharge: 2022-07-11 | Disposition: A | Discharge status: Home / Self Care | Payer: 59 | Attending: Emergency Medicine | Admitting: Emergency Medicine

## 2022-07-11 ENCOUNTER — Encounter (HOSPITAL_COMMUNITY): Payer: Self-pay | Admitting: Emergency Medicine

## 2022-07-11 ENCOUNTER — Other Ambulatory Visit: Payer: Self-pay

## 2022-07-11 ENCOUNTER — Other Ambulatory Visit: Payer: Self-pay | Admitting: Obstetrics and Gynecology

## 2022-07-11 ENCOUNTER — Other Ambulatory Visit (HOSPITAL_COMMUNITY): Payer: Self-pay

## 2022-07-11 DIAGNOSIS — R112 Nausea with vomiting, unspecified: Secondary | ICD-10-CM | POA: Insufficient documentation

## 2022-07-11 DIAGNOSIS — B9689 Other specified bacterial agents as the cause of diseases classified elsewhere: Secondary | ICD-10-CM

## 2022-07-11 DIAGNOSIS — A749 Chlamydial infection, unspecified: Secondary | ICD-10-CM

## 2022-07-11 DIAGNOSIS — N12 Tubulo-interstitial nephritis, not specified as acute or chronic: Secondary | ICD-10-CM | POA: Diagnosis not present

## 2022-07-11 DIAGNOSIS — A599 Trichomoniasis, unspecified: Secondary | ICD-10-CM

## 2022-07-11 DIAGNOSIS — R109 Unspecified abdominal pain: Secondary | ICD-10-CM | POA: Diagnosis present

## 2022-07-11 LAB — COMPREHENSIVE METABOLIC PANEL
ALT: 13 U/L (ref 0–44)
AST: 21 U/L (ref 15–41)
Albumin: 3.7 g/dL (ref 3.5–5.0)
Alkaline Phosphatase: 56 U/L (ref 38–126)
Anion gap: 14 (ref 5–15)
BUN: 10 mg/dL (ref 6–20)
CO2: 21 mmol/L — ABNORMAL LOW (ref 22–32)
Calcium: 9.6 mg/dL (ref 8.9–10.3)
Chloride: 101 mmol/L (ref 98–111)
Creatinine, Ser: 1.1 mg/dL — ABNORMAL HIGH (ref 0.44–1.00)
GFR, Estimated: >60 mL/min (ref >60)
Glucose, Bld: 128 mg/dL — ABNORMAL HIGH (ref 70–99)
Potassium: 3.3 mmol/L — ABNORMAL LOW (ref 3.5–5.1)
Sodium: 136 mmol/L (ref 135–145)
Total Bilirubin: 0.7 mg/dL (ref 0.3–1.2)
Total Protein: 6.8 g/dL (ref 6.5–8.1)

## 2022-07-11 LAB — CBC
HCT: 38 % (ref 36.0–46.0)
Hemoglobin: 12.7 g/dL (ref 12.0–15.0)
MCH: 30.8 pg (ref 26.0–34.0)
MCHC: 33.4 g/dL (ref 30.0–36.0)
MCV: 92.2 fL (ref 80.0–100.0)
Platelets: 426 10*3/uL — ABNORMAL HIGH (ref 150–400)
RBC: 4.12 MIL/uL (ref 3.87–5.11)
RDW: 12.6 % (ref 11.5–15.5)
WBC: 10.6 10*3/uL — ABNORMAL HIGH (ref 4.0–10.5)
nRBC: 0 % (ref 0.0–0.2)

## 2022-07-11 LAB — URINALYSIS, ROUTINE W REFLEX MICROSCOPIC
Bilirubin Urine: NEGATIVE
Glucose, UA: 150 mg/dL — AB
Hgb urine dipstick: NEGATIVE
Ketones, ur: NEGATIVE mg/dL
Nitrite: POSITIVE — AB
Protein, ur: NEGATIVE mg/dL
Specific Gravity, Urine: 1.009 (ref 1.005–1.030)
pH: 5 (ref 5.0–8.0)

## 2022-07-11 LAB — LIPASE, BLOOD: Lipase: 49 U/L (ref 11–51)

## 2022-07-11 LAB — I-STAT BETA HCG BLOOD, ED (MC, WL, AP ONLY): I-stat hCG, quantitative: <5 m[IU]/mL (ref <5)

## 2022-07-11 MED ORDER — METRONIDAZOLE 500 MG PO TABS: 500.0000 mg | ORAL_TABLET | Freq: Two times a day (BID) | ORAL | 0 refills | Status: DC

## 2022-07-11 MED ORDER — ONDANSETRON 4 MG PO TBDP
4.0000 mg | ORAL_TABLET | Freq: Once | ORAL | Status: AC
  Administered 2022-07-11: 4 mg via ORAL
  Filled 2022-07-11: qty 1

## 2022-07-11 MED ORDER — ALUM & MAG HYDROXIDE-SIMETH 200-200-20 MG/5ML PO SUSP
30.0000 mL | Freq: Once | ORAL | Status: AC
  Administered 2022-07-11: 30 mL via ORAL
  Filled 2022-07-11: qty 30

## 2022-07-11 MED ORDER — DICYCLOMINE HCL 10 MG/5ML PO SOLN
10.0000 mg | Freq: Once | ORAL | Status: AC
  Administered 2022-07-11: 10 mg via ORAL
  Filled 2022-07-11: qty 5

## 2022-07-11 MED ORDER — ONDANSETRON 4 MG PO TBDP: 4.0000 mg | ORAL_TABLET | Freq: Three times a day (TID) | ORAL | 0 refills | Status: DC | PRN

## 2022-07-11 MED ORDER — TINIDAZOLE 500 MG PO TABS
2.0000 g | ORAL_TABLET | Freq: Once | ORAL | 0 refills | Status: AC
  Filled 2022-07-11: qty 4, 1d supply, fill #0

## 2022-07-11 MED ORDER — DOXYCYCLINE HYCLATE 100 MG PO CAPS: 100.0000 mg | ORAL_CAPSULE | Freq: Two times a day (BID) | ORAL | 0 refills | Status: DC

## 2022-07-11 MED ORDER — CEPHALEXIN 250 MG/5ML PO SUSR: 500.0000 mg | Freq: Three times a day (TID) | ORAL | 0 refills | Status: AC

## 2022-07-11 MED ORDER — AZITHROMYCIN 500 MG PO TABS
1000.0000 mg | ORAL_TABLET | Freq: Once | ORAL | 0 refills | Status: AC
  Filled 2022-07-11: qty 2, 1d supply, fill #0

## 2022-07-11 MED ORDER — METRONIDAZOLE 0.75 % VA GEL
1.0000 | Freq: Every day | VAGINAL | 0 refills | Status: DC
  Filled 2022-07-11: qty 70, 5d supply, fill #0

## 2022-07-11 NOTE — ED Triage Notes (Signed)
Pt states she ate dinner and then 20 minutes later she started throwing up and "my stomach is turning."

## 2022-07-11 NOTE — ED Notes (Signed)
Patient verbalizes understanding of discharge instructions. Opportunity for questioning and answers were provided. Armband removed by staff, pt discharged from ED. Pt ambulatory to ED waiting room with steady gait.  

## 2022-07-11 NOTE — ED Provider Triage Note (Signed)
Emergency Medicine Provider Triage Evaluation Note  Holly Brown , a 37 y.o. female  was evaluated in triage.  Pt complains of abdominal pain, nausea and vomiting.  Started just prior to arrival.  Works here.  Denies any changes in her diet.  No sick contacts.  Says that it started off as sharp cramps but now it is constant.  No history of abdominal surgery. Review of Systems  Positive:  Negative:   Physical Exam  BP 137/81 (BP Location: Right Arm)   Pulse 91   Temp 98.4 F (36.9 C) (Oral)   Resp 20   LMP 06/30/2022   SpO2 100%  Gen:   Awake, no distress   Resp:  Normal effort  MSK:   Moves extremities without difficulty  Other:  Generalized  Medical Decision Making  Medically screening exam initiated at 7:45 PM.  Appropriate orders placed.  Holly Brown was informed that the remainder of the evaluation will be completed by another provider, this initial triage assessment does not replace that evaluation, and the importance of remaining in the ED until their evaluation is complete.     Rhae Hammock, [place] 07/11/22 1946

## 2022-07-11 NOTE — ED Provider Notes (Signed)
[facility] Provider Note   CSN: [id number] Arrival date & time: 07/11/22  1933     History {Add pertinent medical, surgical, social history, OB history to HPI:1} Chief Complaint  Patient presents with   Emesis   Abdominal Pain    Holly Brown is a 37 y.o. female.  HPI     Home Medications Prior to Admission medications   Medication Sig Start Date End Date Taking? Authorizing Provider  metroNIDAZOLE (FLAGYL) 500 MG tablet Take 1 tablet (500 mg total) by mouth 2 (two) times daily. 03/24/22   Constant, Peggy, MD  azithromycin (ZITHROMAX) 500 MG tablet Take 2 tablets (1,000 mg total) by mouth once for 1 dose. 07/11/22 07/12/22  Woodroe Mode, MD  ibuprofen (ADVIL) 600 MG tablet Take 1 tablet (600 mg total) by mouth every 6 (six) hours as needed. Patient not taking: Reported on 07/09/2022 03/18/22   Constant, Peggy, MD  metroNIDAZOLE (METROGEL) 0.75 % vaginal gel Place 1 Applicatorful vaginally at bedtime for 5 days 07/11/22   Woodroe Mode, MD  Multiple Vitamins-Minerals (MULTIVITAMIN WITH MINERALS) tablet Take 1 tablet by mouth daily. Patient not taking: Reported on 07/09/2022    [provider]  tinidazole (TINDAMAX) 500 MG tablet Take 4 tablets (2,000 mg total) by mouth once for 1 dose. Take with food 07/11/22 07/12/22  Woodroe Mode, MD  valACYclovir (VALTREX) 500 MG tablet Take 1 tablet (500 mg total) by mouth 2 (two) times daily. Patient not taking: Reported on 07/09/2022 05/31/22   Chevis Pretty, FNP      Allergies    Patient has no known allergies.    Review of Systems   Review of Systems  Physical Exam Updated Vital Signs BP 126/78   Pulse 63   Temp 98.4 F (36.9 C) (Oral)   Resp 17   Ht 5\' 10"  (1.778 m)   Wt 101.6 kg   LMP 06/30/2022   SpO2 100%   BMI 32.14 kg/m  Physical Exam  ED Results / Procedures / Treatments   Labs (all labs ordered are listed, but only abnormal results are  displayed) Labs Reviewed  COMPREHENSIVE METABOLIC PANEL - Abnormal; Notable for the following components:      Result Value   Potassium 3.3 (*)    CO2 21 (*)    Glucose, Bld 128 (*)    Creatinine, Ser 1.10 (*)    All other components within normal limits  CBC - Abnormal; Notable for the following components:   WBC 10.6 (*)    Platelets 426 (*)    All other components within normal limits  URINALYSIS, ROUTINE W REFLEX MICROSCOPIC - Abnormal; Notable for the following components:   APPearance HAZY (*)    Glucose, UA 150 (*)    Nitrite POSITIVE (*)    Leukocytes,Ua MODERATE (*)    Bacteria, UA MANY (*)    All other components within normal limits  LIPASE, BLOOD  I-STAT BETA HCG BLOOD, ED ([facility], [facility], [facility] ONLY)    EKG None  Radiology No results found.  Procedures Procedures  {Document cardiac monitor, telemetry assessment procedure when appropriate:1}  Medications Ordered in ED Medications  alum & mag hydroxide-simeth (MAALOX/MYLANTA) 200-200-20 MG/5ML suspension 30 mL (30 mLs Oral Given 07/11/22 2034)  dicyclomine (BENTYL) 10 MG/5ML solution 10 mg (10 mg Oral Given 07/11/22 2034)  ondansetron (ZOFRAN-ODT) disintegrating tablet 4 mg (4 mg Oral Given 07/11/22 2033)    ED Course/ Medical Decision Making/ A&P   {  Click here for ABCD2, HEART and other calculatorsREFRESH Note before signing :1}                          Medical Decision Making  ***  {Document critical care time when appropriate:1} {Document review of labs and clinical decision tools ie heart score, Chads2Vasc2 etc:1}  {Document your independent review of radiology images, and any outside records:1} {Document your discussion with family members, caretakers, and with consultants:1} {Document social determinants of health affecting pt's care:1} {Document your decision making why or why not admission, treatments were needed:1} Final Clinical Impression(s) / ED Diagnoses Final diagnoses:  None    Rx / DC  Orders ED Discharge Orders     None

## 2022-07-13 LAB — URINE CULTURE

## 2022-08-20 ENCOUNTER — Ambulatory Visit (HOSPITAL_COMMUNITY)
Admission: RE | Admit: 2022-08-20 | Discharge: 2022-08-20 | Disposition: A | Discharge status: Home / Self Care | Payer: 59 | Source: Ambulatory Visit | Attending: Emergency Medicine | Admitting: Emergency Medicine

## 2022-08-20 ENCOUNTER — Encounter (HOSPITAL_COMMUNITY): Payer: Self-pay

## 2022-08-20 VITALS — BP 107/71 | HR 100 | Temp 98.5°F | Resp 16 | Ht 71.0 in | Wt 225.0 lb

## 2022-08-20 DIAGNOSIS — Z87891 Personal history of nicotine dependence: Secondary | ICD-10-CM | POA: Diagnosis not present

## 2022-08-20 DIAGNOSIS — Z1152 Encounter for screening for COVID-19: Secondary | ICD-10-CM | POA: Diagnosis not present

## 2022-08-20 DIAGNOSIS — J069 Acute upper respiratory infection, unspecified: Secondary | ICD-10-CM | POA: Diagnosis not present

## 2022-08-20 DIAGNOSIS — R059 Cough, unspecified: Secondary | ICD-10-CM | POA: Diagnosis present

## 2022-08-20 DIAGNOSIS — R6883 Chills (without fever): Secondary | ICD-10-CM | POA: Diagnosis present

## 2022-08-20 LAB — POCT INFLUENZA A/B
Influenza A, POC: NEGATIVE
Influenza B, POC: NEGATIVE

## 2022-08-20 MED ORDER — BENZONATATE 100 MG PO CAPS: 100.0000 mg | ORAL_CAPSULE | Freq: Three times a day (TID) | ORAL | 0 refills | Status: DC

## 2022-08-20 MED ORDER — GUAIFENESIN ER 600 MG PO TB12: 1200.0000 mg | ORAL_TABLET | Freq: Two times a day (BID) | ORAL | 0 refills | Status: AC

## 2022-08-20 NOTE — ED Provider Notes (Signed)
[facility]    CSN: [id number] Arrival date & time: 08/20/22  0827      History   Chief Complaint Chief Complaint  Patient presents with   Chills    One minute of cold one minute I'm hot, my nose been running for the past two days and a slight migraine, threw up 2 times - Entered by patient    HPI Holly Brown is a 37 y.o. female.   Patient presents to clinic for complaints of hot and cold chills, sweats, body aches, generalized weakness, nasal congestion and a dry nonproductive cough that started yesterday.  Reports she took her temperature around 8 PM last night and it was 100 degrees.  Night, so she took Tylenol.  She did try a sinus rinse without much relief.  She does have a mildly sore throat.  Overall decreased appetite, has been able to drink.  Denies a history of asthma, does not smoke.  Denies current chest pain, shortness of breath or wheezing.  Denies recent sick contacts.    The history is provided by the patient and medical records.    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   Gestational diabetes mellitus (GDM) in third trimester controlled on oral hypoglycemic drug 08/03/2018   Urinary tract infection affecting care of mother in first trimester, antepartum 03/07/2018   Supervision of high risk pregnancy, antepartum 02/01/2018   History of preterm delivery, currently pregnant 02/01/2018   Cervical high  risk HPV (human papillomavirus) test positive 05/18/2017   Gonorrhea affecting pregnancy in first trimester 05/15/2017   Family history of colon cancer 10/10/2015   Syncope 07/04/2014   Heart palpitations 07/04/2014    Past Surgical History:  Procedure Laterality Date   DILATION AND CURETTAGE OF UTERUS     LAPAROSCOPIC TUBAL LIGATION Bilateral 12/08/2018   Procedure: LAPAROSCOPIC TUBAL LIGATION;  Surgeon: Catalina Antigua, MD;  Location: [facility];  Service: Gynecology;  Laterality: Bilateral;   LEEP N/A 03/18/2022   Procedure: LOOP ELECTROSURGICAL EXCISION PROCEDURE (LEEP);  Surgeon: Catalina Antigua, MD;  Location: [facility];  Service: Gynecology;  Laterality: N/A;   WISDOM TOOTH EXTRACTION  2011    OB History     Gravida  5   Para  4   Term  3   Preterm  1   AB  1   Living  4      SAB  1   IAB      Ectopic      Multiple  0   Live Births  4            Home Medications    Prior to Admission medications   Medication Sig Start Date End Date Taking? Authorizing Provider  benzonatate (TESSALON) 100 MG capsule Take 1 capsule (100 mg total) by mouth every 8 (eight) hours. 08/20/22  Yes Rinaldo Ratel, [place] N, FNP  guaiFENesin [facility])  600 MG 12 hr tablet Take 2 tablets (1,200 mg total) by mouth 2 (two) times daily for 5 days. 08/20/22 08/25/22 Yes Rinaldo Ratel, [place] N, FNP  ibuprofen (ADVIL) 600 MG tablet Take 1 tablet (600 mg total) by mouth every 6 (six) hours as needed. 03/18/22  Yes Constant, Peggy, MD    Family History Family History  Problem Relation Age of Onset   Colon cancer Father    Hypertension Maternal Grandmother    Diabetes Maternal Grandmother    Hypertension Paternal Grandmother    Diabetes Mother     Social History Social History   Tobacco Use   Smoking status: Former    Packs/day: .25    Types: Cigarettes    Start date: 12/21/2011   Smokeless tobacco: Never  Vaping Use   Vaping Use: Never used  Substance Use Topics   Alcohol  use: Not Currently    Alcohol/week: 0.0 standard drinks of alcohol    Comment: ocassionally    Drug use: Yes    Types: Marijuana    Comment: Last use was on  03/13/22     Allergies   Patient has no known allergies.   Review of Systems Review of Systems  Constitutional:  Positive for chills and fever.  HENT:  Positive for congestion, sinus pressure and sore throat. Negative for ear pain.   Eyes:  Negative for pain and visual disturbance.  Respiratory:  Positive for cough. Negative for shortness of breath.   Cardiovascular:  Negative for chest pain and palpitations.  Gastrointestinal:  Negative for abdominal pain and vomiting.  Genitourinary:  Negative for dysuria and hematuria.  Musculoskeletal:  Negative for arthralgias and back pain.  Skin:  Negative for color change and rash.  Neurological:  Negative for seizures and syncope.  All other systems reviewed and are negative.    Physical Exam Triage Vital Signs ED Triage Vitals [08/20/22 0839]  Enc Vitals Group     BP 107/71     Pulse Rate 100     Resp 16     Temp 98.5 F (36.9 C)     Temp Source Oral     SpO2 95 %     Weight 225 lb (102.1 kg)     Height 5\' 11"  (1.803 m)     Head Circumference      Peak Flow      Pain Score 0     Pain Loc      Pain Edu?      Excl. in GC?    No data found.  Updated Vital Signs BP 107/71 (BP Location: Right Arm)   Pulse 100   Temp 98.5 F (36.9 C) (Oral)   Resp 16   Ht 5\' 11"  (1.803 m)   Wt 225 lb (102.1 kg)   LMP 07/31/2022 (Exact Date)   SpO2 95%   BMI 31.38 kg/m   Visual Acuity Right Eye Distance:   Left Eye Distance:   Bilateral Distance:    Right Eye Near:   Left Eye Near:    Bilateral Near:     Physical Exam Vitals and nursing note reviewed.  Constitutional:      General: She is not in acute distress.    Appearance: She is well-developed.  HENT:     Head: Normocephalic and atraumatic.     Right Ear: External ear normal.     Left Ear: External ear  normal.     Nose: Congestion and rhinorrhea present.     Mouth/Throat:  Mouth: Mucous membranes are moist.     Pharynx: Posterior oropharyngeal erythema present.  Eyes:     General: No scleral icterus.    Conjunctiva/sclera: Conjunctivae normal.  Cardiovascular:     Rate and Rhythm: Normal rate and regular rhythm.     Heart sounds: Normal heart sounds. No murmur heard. Pulmonary:     Effort: Pulmonary effort is normal. No respiratory distress.     Breath sounds: Normal breath sounds.  Musculoskeletal:        General: No swelling. Normal range of motion.     Cervical back: Neck supple.  Skin:    General: Skin is warm and dry.  Neurological:     General: No focal deficit present.     Mental Status: She is alert and oriented to person, place, and time.  Psychiatric:        Mood and Affect: Mood normal.        Behavior: Behavior normal.      UC Treatments / Results  Labs (all labs ordered are listed, but only abnormal results are displayed) Labs Reviewed  SARS CORONAVIRUS 2 (TAT 6-24 HRS)  POCT INFLUENZA A/B    EKG   Radiology No results found.  Procedures Procedures (including critical care time)  Medications Ordered in UC Medications - No data to display  Initial Impression / Assessment and Plan / UC Course  I have reviewed the triage vital signs and the nursing notes.  Pertinent labs & imaging results that were available during my care of the patient were reviewed by me and considered in my medical decision making (see chart for details).  Vitals and triage reviewed, patient is hemodynamically stable.  Presents to clinic with 1 day of cough, congestion, sinus pressure and sore throat.  Rapid flu testing negative, will swab for Covid-19. Discussed symptomatic management of viral upper respiratory tract infection.  Patient verbalized understanding, no questions at this time.  Return and follow-up precautions discussed.      Final Clinical Impressions(s) / UC  Diagnoses   Final diagnoses:  Viral URI with cough     Discharge Instructions      Your flu testing was negative in clinic.  We have tested you for COVID-19 and we will contact you if positive.  Please continue symptomatic management, you can alternate between Tylenol and ibuprofen every 4-6 hours for fever, body aches and discomfort.  Please take 1200 mg Mucinex daily as well as drink at least 64 ounces of water to help loosen your secretions.  I also suggest sleeping with a humidifier at night.  You can do warm saline gargles, lozenges, and tea with honey if you sore throat worsens.  Please return to clinic or follow-up with a primary care if you have no improvement of symptoms in the next 7 to 10 days, or any new concerning symptoms that warrant further evaluation.      ED Prescriptions     Medication Sig Dispense Auth. Provider   benzonatate (TESSALON) 100 MG capsule Take 1 capsule (100 mg total) by mouth every 8 (eight) hours. 21 capsule Rinaldo Ratel, [place] N, [place]   guaiFENesin (MUCINEX) 600 MG 12 hr tablet Take 2 tablets (1,200 mg total) by mouth 2 (two) times daily for 5 days. 20 tablet , [place] N, [place]      PDMP not reviewed this encounter.   Rinaldo Ratel [place] N, [place] 08/20/22 [phone]

## 2022-08-20 NOTE — Discharge Instructions (Addendum)
Your flu testing was negative in clinic.  We have tested you for COVID-19 and we will contact you if positive.  Please continue symptomatic management, you can alternate between Tylenol and ibuprofen every 4-6 hours for fever, body aches and discomfort.  Please take 1200 mg Mucinex daily as well as drink at least 64 ounces of water to help loosen your secretions.  I also suggest sleeping with a humidifier at night.  You can do warm saline gargles, lozenges, and tea with honey if you sore throat worsens.  Please return to clinic or follow-up with a primary care if you have no improvement of symptoms in the next 7 to 10 days, or any new concerning symptoms that warrant further evaluation.

## 2022-08-20 NOTE — ED Triage Notes (Signed)
Patient here today with c/o fever, chills, sweats, body aches, weakness, stuffy head, dry cough X 1 day. Runny nose since Sunday. She has taken Tylenol last night which helped some. She also did a sinus rinse. No sick contacts. No recent travel.

## 2022-08-21 LAB — SARS CORONAVIRUS 2 (TAT 6-24 HRS): SARS Coronavirus 2: NEGATIVE

## 2022-08-29 ENCOUNTER — Encounter: Payer: Self-pay | Admitting: Obstetrics

## 2022-08-29 ENCOUNTER — Telehealth: Payer: Self-pay

## 2022-08-29 NOTE — Telephone Encounter (Signed)
patient called inquiring about FMLA paper. informed patient it was received on 08/27/22 and takes up to 10 business to complete.

## 2022-09-03 ENCOUNTER — Telehealth: Payer: Self-pay

## 2022-09-03 NOTE — Telephone Encounter (Signed)
Attempted to contact patient x 2 regarding [employer] paperwork. Unable to reach patient. Message stating that call can not be completed as dialed.

## 2022-09-09 ENCOUNTER — Ambulatory Visit (HOSPITAL_COMMUNITY)
Admission: RE | Admit: 2022-09-09 | Discharge: 2022-09-09 | Disposition: A | Discharge status: Home / Self Care | Payer: 59 | Source: Ambulatory Visit | Attending: Family Medicine | Admitting: Family Medicine

## 2022-09-09 ENCOUNTER — Ambulatory Visit (INDEPENDENT_AMBULATORY_CARE_PROVIDER_SITE_OTHER): Payer: 59

## 2022-09-09 ENCOUNTER — Other Ambulatory Visit (HOSPITAL_COMMUNITY): Payer: Self-pay

## 2022-09-09 ENCOUNTER — Encounter (HOSPITAL_COMMUNITY): Payer: Self-pay

## 2022-09-09 VITALS — BP 132/77 | HR 76 | Temp 98.7°F | Resp 18

## 2022-09-09 DIAGNOSIS — M79641 Pain in right hand: Secondary | ICD-10-CM | POA: Diagnosis not present

## 2022-09-09 MED ORDER — IBUPROFEN 800 MG PO TABS
800.0000 mg | ORAL_TABLET | Freq: Three times a day (TID) | ORAL | 0 refills | Status: DC | PRN
  Filled 2022-09-09: qty 21, 7d supply, fill #0

## 2022-09-09 NOTE — ED Triage Notes (Signed)
Pt states she woke up Sunday morning with her right hand swollen and hurting. She has been taking Tylenol and IBU alternating without relief.

## 2022-09-09 NOTE — Discharge Instructions (Signed)
The x-ray does not show any bony problem.  I suspect there is bruising or inflammation in the soft tissues.  Take ibuprofen 800 mg--1 tab every 8 hours as needed for pain.\

## 2022-09-09 NOTE — ED Provider Notes (Signed)
[facility]    CSN: [id number] Arrival date & time: 09/09/22  7829      History   Chief Complaint Chief Complaint  Patient presents with   Hand Problem    Right hand pain - Entered by patient    HPI Holly Brown is a 37 y.o. female.   HPI Here for pain in her right hand.  She first noticed it on the morning of May 19.  She thinks maybe she had struck it on her headboard of her bed.  No other trauma or fall.  No fever and no rash  Last menstrual cycle was May 2.  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   Gestational diabetes mellitus (GDM) in third trimester controlled on oral hypoglycemic drug 08/03/2018   Urinary tract infection affecting care of mother in first trimester, antepartum 03/07/2018   Supervision of high risk pregnancy, antepartum 02/01/2018   History of preterm delivery, currently pregnant 02/01/2018   Cervical high risk HPV (human papillomavirus) test positive 05/18/2017   Gonorrhea affecting pregnancy in first trimester 05/15/2017   Family history of colon cancer 10/10/2015   Syncope 07/04/2014   Heart palpitations 07/04/2014    Past Surgical History:  Procedure Laterality Date   DILATION AND CURETTAGE OF UTERUS     LAPAROSCOPIC TUBAL LIGATION Bilateral 12/08/2018   Procedure: LAPAROSCOPIC TUBAL LIGATION;  Surgeon: Catalina Antigua, MD;  Location: [facility];  Service: Gynecology;  Laterality: Bilateral;   LEEP N/A 03/18/2022   Procedure: LOOP ELECTROSURGICAL EXCISION PROCEDURE (LEEP);  Surgeon: Catalina Antigua, MD;  [facility]: [facility];  Service: Gynecology;  Laterality: N/A;   WISDOM TOOTH EXTRACTION  2011    OB History     Gravida  5   Para  4   Term  3   Preterm  1   AB  1   Living  4      SAB  1   IAB      Ectopic      Multiple  0   Live Births  4            Home Medications    Prior to Admission medications   Medication Sig Start Date End Date Taking? Authorizing Provider  ibuprofen (ADVIL) 800 MG tablet Take 1 tablet (800 mg total) by mouth every 8 (eight) hours as needed (pain). 09/09/22  Yes Zenia Resides, MD    Family History Family History  Problem Relation Age of Onset   Colon cancer Father    Hypertension Maternal Grandmother    Diabetes Maternal Grandmother    Hypertension Paternal Grandmother    Diabetes Mother     Social History Social History   Tobacco Use   Smoking status: Former    Packs/day: .25    Types: Cigarettes    Start date: 12/21/2011   Smokeless tobacco:  Never  Vaping Use   Vaping Use: Never used  Substance Use Topics   Alcohol use: Not Currently    Alcohol/week: 0.0 standard drinks of alcohol    Comment: ocassionally    Drug use: Yes    Types: Marijuana     Allergies   Patient has no known allergies.   Review of Systems Review of Systems   Physical Exam Triage Vital Signs ED Triage Vitals  Enc Vitals Group     BP 09/09/22 0835 132/77     Pulse Rate 09/09/22 0835 76     Resp 09/09/22 0835 18     Temp 09/09/22 0835 98.7 F (37.1 C)     Temp Source 09/09/22 0835 Oral     SpO2 09/09/22 0835 99 %     Weight --      Height --      Head Circumference --      Peak Flow --      Pain Score 09/09/22 0833 7     Pain Loc --      Pain Edu? --      Excl. in GC? --    No data found.  Updated Vital Signs BP 132/77 (BP Location: Right Arm)    Pulse 76   Temp 98.7 F (37.1 C) (Oral)   Resp 18   LMP 08/21/2022 (Exact Date)   SpO2 99%   Visual Acuity Right Eye Distance:   Left Eye Distance:   Bilateral Distance:    Right Eye Near:   Left Eye Near:    Bilateral Near:     Physical Exam Vitals reviewed.  Constitutional:      General: She is not in acute distress.    Appearance: She is not ill-appearing, toxic-appearing or diaphoretic.  Musculoskeletal:     Comments: There is some tenderness of the proximal dorsum of the hand just distal to the wrist.  There is no erythema or ecchymosis or deformity  Skin:    Coloration: Skin is not jaundiced or pale.  Neurological:     General: No focal deficit present.     Mental Status: She is alert and oriented to person, place, and time.  Psychiatric:        Behavior: Behavior normal.      UC Treatments / Results  Labs (all labs ordered are listed, but only abnormal results are displayed) Labs Reviewed - No data to display  EKG   Radiology DG Hand Complete Right  Result Date: 09/09/2022 CLINICAL DATA:  pain in proximal dorsum of right hand. ?trauma EXAM: RIGHT HAND - COMPLETE 3+ VIEW COMPARISON:  None Available. FINDINGS: There is no evidence of fracture or dislocation. There is no evidence of arthropathy or other focal bone abnormality. Soft tissues are unremarkable. IMPRESSION: Negative. Electronically Signed   By: Charlett Nose M.D.   On: 09/09/2022 09:09    Procedures Procedures (including critical care time)  Medications Ordered in UC Medications - No data to display  Initial Impression / Assessment and Plan / UC Course  I have reviewed the triage vital signs and the nursing notes.  Pertinent labs & imaging results that were available during my care of the patient were reviewed by me and considered in my medical decision making (see chart for details).        X-rays are negative.  Ibuprofen is sent in for inflammation or bruising.  Final Clinical  Impressions(s) / UC Diagnoses   Final diagnoses:  Right hand pain  Discharge Instructions      The x-ray does not show any bony problem.  I suspect there is bruising or inflammation in the soft tissues.  Take ibuprofen 800 mg--1 tab every 8 hours as needed for pain.\      ED Prescriptions     Medication Sig Dispense Auth. Provider   ibuprofen (ADVIL) 800 MG tablet Take 1 tablet (800 mg total) by mouth every 8 (eight) hours as needed (pain). 21 tablet , Janace Aris, MD      PDMP not reviewed this encounter.   Zenia Resides, MD 09/09/22 [phone]

## 2022-09-10 ENCOUNTER — Encounter: Payer: Self-pay | Admitting: Obstetrics

## 2022-09-10 NOTE — Telephone Encounter (Signed)
Left voicemail with patient to call back regarding her FMLA request.  We do not show anything in chart regarding need for leave.

## 2022-09-12 ENCOUNTER — Encounter: Payer: Self-pay | Admitting: Obstetrics

## 2022-09-12 ENCOUNTER — Telehealth (INDEPENDENT_AMBULATORY_CARE_PROVIDER_SITE_OTHER): Payer: 59 | Admitting: Obstetrics

## 2022-09-12 ENCOUNTER — Other Ambulatory Visit (HOSPITAL_COMMUNITY): Payer: Self-pay

## 2022-09-12 DIAGNOSIS — D069 Carcinoma in situ of cervix, unspecified: Secondary | ICD-10-CM | POA: Diagnosis not present

## 2022-09-12 DIAGNOSIS — Z9889 Other specified postprocedural states: Secondary | ICD-10-CM

## 2022-09-12 DIAGNOSIS — E669 Obesity, unspecified: Secondary | ICD-10-CM

## 2022-09-12 DIAGNOSIS — N939 Abnormal uterine and vaginal bleeding, unspecified: Secondary | ICD-10-CM

## 2022-09-12 MED ORDER — MEGESTROL ACETATE 40 MG PO TABS
80.0000 mg | ORAL_TABLET | Freq: Two times a day (BID) | ORAL | 1 refills | Status: DC
  Filled 2022-09-12: qty 60, 15d supply, fill #0

## 2022-09-12 NOTE — Progress Notes (Signed)
GYNECOLOGY VIRTUAL VISIT ENCOUNTER NOTE  Provider location: Center for [facility]   Patient location: Home  I connected with [name] on 09/12/22 at  9:55 AM EDT by MyChart Video Encounter and verified that I am speaking with the correct person using two identifiers.   I discussed the limitations, risks, security and privacy concerns of performing an evaluation and management service virtually and the availability of in person appointments. I also discussed with the patient that there may be a patient responsible charge related to this service. The patient expressed understanding and agreed to proceed.   History:  Holly Brown is a 37 y.o. [phone] female being evaluated today for AUB. She denies any abnormal vaginal discharge, bleeding, pelvic pain or other concerns.       Past Medical History:  Diagnosis Date   Gestational diabetes    metformin - resolved after delivery   History of UTI    HSV (herpes simplex virus) infection    Migraines    Seizures (HCC)    Jan.2016 - only had one seizure, none since   Vaginal Pap smear, abnormal    Past Surgical History:  Procedure Laterality Date   DILATION AND CURETTAGE OF UTERUS     LAPAROSCOPIC TUBAL LIGATION Bilateral 12/08/2018   Procedure: LAPAROSCOPIC TUBAL LIGATION;  Surgeon: Catalina Antigua, MD;  Location: [facility];  Service: Gynecology;  Laterality: Bilateral;   LEEP N/A 03/18/2022   Procedure: LOOP ELECTROSURGICAL EXCISION PROCEDURE (LEEP);  Surgeon: Catalina Antigua, MD;  [facility];  Service: Gynecology;  Laterality: N/A;   WISDOM TOOTH EXTRACTION  2011   The following portions of the patient's history were reviewed and updated as appropriate: allergies, current medications, past family history, past medical history, past social history, past surgical history and problem list.   Health Maintenance:  Abnormal pap and positive HRHPV on 02-25-2022, and is s/p LEEP on  03-18-2022.    Review of Systems:  Pertinent items noted in HPI and remainder of comprehensive ROS otherwise negative.  Physical Exam:   General:  Alert, oriented and cooperative. Patient appears to be in no acute distress.  Mental Status: Normal mood and affect. Normal behavior. Normal judgment and thought content.   Respiratory: Normal respiratory effort, no problems with respiration noted  Rest of physical exam deferred due to type of encounter  Labs and Imaging    CT ABDOMEN PELVIS W CONTRAST (Accession [id number]) (Order [id number]) Imaging Date: 09/23/2021 Department: [facility] at [facility] Released By/Authorizing: Ernie Avena, MD (auto-released)   Exam Status  Status  Final [99]   PACS Intelerad Image Link   Show images for CT ABDOMEN PELVIS W CONTRAST Study Result  Narrative & Impression  CLINICAL DATA:  LLQ abdominal pain   EXAM: CT ABDOMEN AND PELVIS WITH CONTRAST   TECHNIQUE: Multidetector CT imaging of the abdomen and pelvis was performed using the standard protocol following bolus administration of intravenous contrast.   RADIATION DOSE REDUCTION: This exam was performed according to the departmental dose-optimization program which includes automated exposure control, adjustment of the mA and/or kV according to patient size and/or use of iterative reconstruction technique.   CONTRAST:  OMNIPAQUE IOHEXOL 300 MG/ML  SOLN   COMPARISON:  None Available.   FINDINGS: Lower chest: Included lung bases are clear.  Heart size is normal.   Hepatobiliary: No focal liver abnormality is seen. No gallstones, gallbladder wall thickening, or biliary dilatation.   Pancreas: Unremarkable. No  pancreatic ductal dilatation or surrounding inflammatory changes.   Spleen: Normal in size without focal abnormality.   Adrenals/Urinary Tract: Unremarkable adrenal glands. Kidneys enhance symmetrically without focal lesion, stone, or  hydronephrosis. Ureters are nondilated. Urinary bladder appears unremarkable.   Stomach/Bowel: Stomach is normal in appearance. Unremarkable small bowel. No dilated loops of bowel. Normal appendix in the right lower quadrant (series 2, image 48). Long segment mild colonic wall thickening, predominantly involving the descending colon and distal transverse colon. No pericolonic inflammatory changes.   Vascular/Lymphatic: No significant vascular findings are present. No enlarged abdominal or pelvic lymph nodes.   Reproductive: Uterus and bilateral adnexa are unremarkable.   Other: No free fluid. No abdominopelvic fluid collection. No pneumoperitoneum. No abdominal wall hernia.   Musculoskeletal: No acute or significant osseous findings.   IMPRESSION: 1. Long segment mild colonic wall thickening, predominantly involving the descending colon and distal transverse colon, suggestive of a mild colitis. 2. Normal appendix.     Electronically Signed   By: Duanne Guess D.O.   On: 09/23/2021 15:55          Assessment and Plan:     1. Abnormal uterine bleeding (AUB) - patient desires definitive surgical management - she is s/p tubal sterilization in 2020 Rx: - megestrol (MEGACE) 40 MG tablet; Take 2 tablets (80 mg total) by mouth 2 (two) times daily.  Dispense: 60 tablet; Refill: 1  2. High grade squamous intraepithelial lesion (HGSIL), grade 3 CIN, on biopsy of cervix  3. S/P LEEP - needs pap smear  4. Obesity (BMI 30-39.9) - weight reduction recommended with the aid of dietary changes and exercise       I discussed the assessment and treatment plan with the patient. The patient was provided an opportunity to ask questions and all were answered. The patient agreed with the plan and demonstrated an understanding of the instructions.   The patient was advised to call back or seek an in-person evaluation/go to the ED if the symptoms worsen or if the condition fails to improve  as anticipated.  I have spent a total of 10 minutes non-face-to-face time, excluding clinical staff time, reviewing notes and preparing to see patient, ordering tests and/or medications, and counseling the patient.    Coral Ceo, MD Center for [facility], [facility], [facility],  09/12/22

## 2022-09-18 ENCOUNTER — Telehealth: Payer: Self-pay

## 2022-09-18 NOTE — Telephone Encounter (Signed)
Spoke with Pt, she will be discussing surgically fixing the problem (Hys) at her appt 10/08/22 with Dr. Alysia Penna. She was prescribed Megace which has slowed the bleeding down some. FMLA Forms can be used for her Surgery and recovery time.

## 2022-09-24 ENCOUNTER — Encounter: Payer: Self-pay | Admitting: Obstetrics

## 2022-09-29 ENCOUNTER — Ambulatory Visit: Payer: 59 | Admitting: Obstetrics

## 2022-09-29 ENCOUNTER — Ambulatory Visit (INDEPENDENT_AMBULATORY_CARE_PROVIDER_SITE_OTHER): Payer: 59 | Admitting: Obstetrics and Gynecology

## 2022-09-29 ENCOUNTER — Other Ambulatory Visit (HOSPITAL_COMMUNITY): Payer: Self-pay

## 2022-09-29 ENCOUNTER — Encounter: Payer: Self-pay | Admitting: Obstetrics and Gynecology

## 2022-09-29 VITALS — BP 134/89 | HR 64 | Ht 71.0 in | Wt 234.7 lb

## 2022-09-29 DIAGNOSIS — R87613 High grade squamous intraepithelial lesion on cytologic smear of cervix (HGSIL): Secondary | ICD-10-CM

## 2022-09-29 DIAGNOSIS — R87619 Unspecified abnormal cytological findings in specimens from cervix uteri: Secondary | ICD-10-CM | POA: Insufficient documentation

## 2022-09-29 DIAGNOSIS — N939 Abnormal uterine and vaginal bleeding, unspecified: Secondary | ICD-10-CM | POA: Insufficient documentation

## 2022-09-29 MED ORDER — MEGESTROL ACETATE 40 MG PO TABS
40.0000 mg | ORAL_TABLET | Freq: Two times a day (BID) | ORAL | 5 refills | Status: DC
Start: 2022-09-29 — End: 2022-10-13
  Filled 2022-09-29: qty 60, 15d supply, fill #0

## 2022-09-29 NOTE — Progress Notes (Unsigned)
Pt presents for menorrhagia, dysmenorrhea, nausea and vomiting during cycles.  Megace slowed the bleeding but bleeding did not subside.  Declines VE

## 2022-09-30 NOTE — Progress Notes (Signed)
Holly Brown presents in referral from Dr Clearance Coots for AUB and [facility]. Has been a probable since BTL several yrs ago. Has tried medications in the past. Most recently tried Megace which help some  H/O HGSIL, s/p LEEP 11/23, + margins. Has not had a f/u pap since procedure  H/O BTL  H/O TSVD and SVD ( largest 7-8 #)  PE  AF VSS Lungs clear Heart RRR Abd soft + BS GU declined  A/P AUB/[facility]        H/O HGSIL  AUB/[facility] reviewed with pt. Will check GYN U/S. Restart Megace at 40 mg bid. Indications reviewed. F/U in 4 weeks to discuss Gyn U/S and Tx options.

## 2022-10-07 ENCOUNTER — Ambulatory Visit (HOSPITAL_BASED_OUTPATIENT_CLINIC_OR_DEPARTMENT_OTHER)
Admission: RE | Admit: 2022-10-07 | Discharge: 2022-10-07 | Disposition: A | Discharge status: Home / Self Care | Payer: 59 | Source: Ambulatory Visit | Attending: Obstetrics and Gynecology | Admitting: Obstetrics and Gynecology

## 2022-10-07 DIAGNOSIS — N939 Abnormal uterine and vaginal bleeding, unspecified: Secondary | ICD-10-CM | POA: Diagnosis not present

## 2022-10-08 ENCOUNTER — Institutional Professional Consult (permissible substitution): Payer: 59 | Admitting: Obstetrics and Gynecology

## 2022-10-08 ENCOUNTER — Encounter: Payer: Self-pay | Admitting: Obstetrics and Gynecology

## 2022-10-13 ENCOUNTER — Other Ambulatory Visit: Payer: Self-pay

## 2022-10-13 ENCOUNTER — Encounter: Payer: Self-pay | Admitting: Obstetrics and Gynecology

## 2022-10-13 ENCOUNTER — Telehealth (INDEPENDENT_AMBULATORY_CARE_PROVIDER_SITE_OTHER): Payer: 59 | Admitting: Obstetrics and Gynecology

## 2022-10-13 ENCOUNTER — Other Ambulatory Visit (HOSPITAL_COMMUNITY)
Admission: RE | Admit: 2022-10-13 | Discharge: 2022-10-13 | Disposition: A | Discharge status: Home / Self Care | Payer: 59 | Source: Ambulatory Visit | Attending: Obstetrics and Gynecology | Admitting: Obstetrics and Gynecology

## 2022-10-13 ENCOUNTER — Ambulatory Visit (INDEPENDENT_AMBULATORY_CARE_PROVIDER_SITE_OTHER): Payer: 59 | Admitting: Obstetrics and Gynecology

## 2022-10-13 ENCOUNTER — Other Ambulatory Visit (HOSPITAL_COMMUNITY): Payer: Self-pay

## 2022-10-13 VITALS — BP 120/84 | HR 78 | Wt 229.0 lb

## 2022-10-13 DIAGNOSIS — N939 Abnormal uterine and vaginal bleeding, unspecified: Secondary | ICD-10-CM | POA: Diagnosis not present

## 2022-10-13 DIAGNOSIS — R87613 High grade squamous intraepithelial lesion on cytologic smear of cervix (HGSIL): Secondary | ICD-10-CM | POA: Diagnosis not present

## 2022-10-13 MED ORDER — OXYCODONE-ACETAMINOPHEN 5-325 MG PO TABS
1.0000 | ORAL_TABLET | Freq: Four times a day (QID) | ORAL | 0 refills | Status: DC | PRN
  Filled 2022-10-13: qty 30, 4d supply, fill #0

## 2022-10-13 MED ORDER — TRANEXAMIC ACID 650 MG PO TABS
1300.0000 mg | ORAL_TABLET | Freq: Three times a day (TID) | ORAL | 2 refills | Status: DC
Start: 2022-10-13 — End: 2022-10-17
  Filled 2022-10-13: qty 30, 5d supply, fill #0

## 2022-10-13 MED ORDER — MEGESTROL ACETATE 40 MG PO TABS
40.0000 mg | ORAL_TABLET | Freq: Three times a day (TID) | ORAL | 5 refills | Status: DC
Start: 2022-10-13 — End: 2022-11-19
  Filled 2022-10-13: qty 20, 7d supply, fill #0
  Filled 2022-10-13: qty 40, 13d supply, fill #0

## 2022-10-13 NOTE — Progress Notes (Signed)
Ms Grayling Congress presents for f/u of GYN U/S. See prior office notes GYN U/S reviewed with pt Pt reports cycle is still heavy with cramps and clots. Not taking Megace because she did not think it was helping and also did not like the side effects  Pt desires definite therapy. [facility] is interfering with her ADL and work.  H/O TSVD x 4  Last pap 12/23, abnormal, s/p LEEP, no follow up since  PE AF VSS Chaperone present  Lungs clear Heart RRR Abd soft + BS GU NL EGBUS, scant bleeding noted, pap smear obtained, uterus small, < 8 weeks, mobile, non tender, no mass  A/P [facility]        Abnormal pap smear  Repeat pap smear today. Tx options reviewed with pt. TVH with possible salpingectomy discussed. R/B/Post op care reviewed. Hysterectomy papers signed today. Information provided as well. Will start Megace tid, TXA and Percocet. Work note for today and tomorrow. F/U with post op appt

## 2022-10-13 NOTE — Progress Notes (Signed)
Pt not seen by MD, was rescheduled to in person visit.

## 2022-10-13 NOTE — Progress Notes (Signed)
MyChart GYN, Pt stopped taking the Megace because it was making the bleeding worse.  C/o bleeding.

## 2022-10-13 NOTE — Progress Notes (Signed)
Pt is in office to discuss bleeding and cramping.  Pt states she is "miserable".  Pt is not taking any medication at this time.  Pt also needs to discuss work conditions and possible FMLA for cycles.  Pt states lmp 5/26 and is still bleeding. Pt has had light to heavy bleeding.

## 2022-10-15 LAB — CYTOLOGY - PAP
Comment: NEGATIVE
Diagnosis: HIGH — AB
High risk HPV: POSITIVE — AB

## 2022-10-16 ENCOUNTER — Telehealth: Payer: Self-pay

## 2022-10-16 NOTE — Telephone Encounter (Signed)
Patient called requesting to start back on depo until her surgery. She states that she is having a hard time swallowing the megace tablets and she is still bleeding. Please advise

## 2022-10-17 ENCOUNTER — Other Ambulatory Visit: Payer: Self-pay

## 2022-10-17 ENCOUNTER — Other Ambulatory Visit (HOSPITAL_COMMUNITY): Payer: Self-pay

## 2022-10-17 DIAGNOSIS — N939 Abnormal uterine and vaginal bleeding, unspecified: Secondary | ICD-10-CM

## 2022-10-17 MED ORDER — MEDROXYPROGESTERONE ACETATE 150 MG/ML IM SUSY
150.0000 mg | PREFILLED_SYRINGE | INTRAMUSCULAR | 0 refills | Status: DC
  Filled 2022-10-17 (×2): qty 1, 90d supply, fill #0

## 2022-10-17 NOTE — Telephone Encounter (Signed)
Patient informed. Message sent to front desk to schedule nurse visit

## 2022-10-21 ENCOUNTER — Other Ambulatory Visit (HOSPITAL_COMMUNITY): Payer: Self-pay

## 2022-10-21 ENCOUNTER — Ambulatory Visit (INDEPENDENT_AMBULATORY_CARE_PROVIDER_SITE_OTHER): Payer: 59 | Admitting: *Deleted

## 2022-10-21 VITALS — BP 120/79 | HR 79

## 2022-10-21 DIAGNOSIS — Z3042 Encounter for surveillance of injectable contraceptive: Secondary | ICD-10-CM | POA: Diagnosis not present

## 2022-10-21 DIAGNOSIS — N939 Abnormal uterine and vaginal bleeding, unspecified: Secondary | ICD-10-CM

## 2022-10-21 MED ORDER — MEDROXYPROGESTERONE ACETATE 150 MG/ML IM SUSP
150.0000 mg | Freq: Once | INTRAMUSCULAR | Status: AC
Start: 2022-10-21 — End: 2022-10-21
  Administered 2022-10-21: 150 mg via INTRAMUSCULAR

## 2022-10-21 NOTE — Progress Notes (Signed)
Date last pap: 10/13/22. Last Depo-Provera: 2021 (Had BTL, No unprotected intercourse since May, AUB, consulted with Dr. Debroah Loop, [place] for Depo. Side Effects if any: NA. Serum HCG indicated? NA. Depo-Provera 150 mg IM given by: Selena Batten. Rolley Sims, RN in [employer]. Next appointment due NA/ scheduled for TVH.  Abnormal Pap 10/13/22: +HPV, HGSIL. Colpo/ EMB appt scheduled for 11/19/22. Pt on call list for sooner appt if one becomes available.

## 2022-10-22 ENCOUNTER — Telehealth: Payer: Self-pay | Admitting: *Deleted

## 2022-10-22 NOTE — Telephone Encounter (Signed)
TC to follow up on FMLA/ Intermittent leave request. Pt requesting FMLA to cover absences for heavy periods, pain and vomiting. Pt has consulted with Dr. Alysia Penna and [facility] is planned for August. Pt currently taking Megace for AUB and had Depo injection 10/21/22. I reviewed provider notes for Dr. Alysia Penna and Dr. Clearance Coots and do not see documentation regarding pt discussion about intermittent leave for AUB. I advised the pt to provide a list of dates that she was absent due to AUB. Advised that staff would have to consult with Dr. Alysia Penna regarding his approval or recommendations for intermittent leave. Pt verbalized understanding and is to send list of dates via MyChart message on Monday 10/27/22.

## 2022-10-24 ENCOUNTER — Telehealth: Payer: 59 | Admitting: Obstetrics and Gynecology

## 2022-10-27 ENCOUNTER — Encounter: Payer: Self-pay | Admitting: Obstetrics and Gynecology

## 2022-10-28 ENCOUNTER — Telehealth: Payer: Self-pay

## 2022-10-28 NOTE — Telephone Encounter (Signed)
Patient called to see if her surgery could be scheduled with Dr. Alysia Penna in August, 2024. Advised Dr. Alysia Penna is scheduled for the OR on 12/16/22. Patient states that day works for her schedule. Advised I would pull her order and schedule the procedure. Informed the patient that I would call her back with procedure time and details.

## 2022-10-28 NOTE — Telephone Encounter (Addendum)
Error----- Message from Hermina Staggers, MD sent at 10/13/2022  9:52 AM EDT ----- Please schedule pt TVH with possible salpingectomy due to [facility]. Thanks Casimiro Needle

## 2022-10-30 ENCOUNTER — Other Ambulatory Visit: Payer: Self-pay | Admitting: Oncology

## 2022-10-30 DIAGNOSIS — Z006 Encounter for examination for normal comparison and control in clinical research program: Secondary | ICD-10-CM

## 2022-11-08 ENCOUNTER — Other Ambulatory Visit: Payer: Self-pay | Admitting: Nurse Practitioner

## 2022-11-09 ENCOUNTER — Encounter: Payer: Self-pay | Admitting: Obstetrics and Gynecology

## 2022-11-10 ENCOUNTER — Other Ambulatory Visit: Payer: Self-pay | Admitting: Advanced Practice Midwife

## 2022-11-10 ENCOUNTER — Other Ambulatory Visit (HOSPITAL_COMMUNITY): Payer: Self-pay

## 2022-11-10 ENCOUNTER — Other Ambulatory Visit: Payer: Self-pay

## 2022-11-10 DIAGNOSIS — A6000 Herpesviral infection of urogenital system, unspecified: Secondary | ICD-10-CM

## 2022-11-10 MED ORDER — ACYCLOVIR 200 MG/5ML PO SUSP
800.0000 mg | Freq: Two times a day (BID) | ORAL | 5 refills | Status: AC
  Filled 2022-11-10: qty 200, 5d supply, fill #0

## 2022-11-10 NOTE — Progress Notes (Signed)
Pt called to request Rx for liquid acyclovir for HSV outbreak.  Rx sent to pharmacy on file with 800 mg BID x 5 days for recurrent outbreak, with 5 refills.

## 2022-11-10 NOTE — Progress Notes (Signed)
Spoke with pt, pt requesting liquid form of medication instead of pills.

## 2022-11-19 ENCOUNTER — Other Ambulatory Visit (HOSPITAL_COMMUNITY)
Admission: RE | Admit: 2022-11-19 | Discharge: 2022-11-19 | Disposition: A | Discharge status: Home / Self Care | Payer: 59 | Source: Ambulatory Visit | Attending: Obstetrics and Gynecology | Admitting: Obstetrics and Gynecology

## 2022-11-19 ENCOUNTER — Ambulatory Visit (INDEPENDENT_AMBULATORY_CARE_PROVIDER_SITE_OTHER): Payer: 59 | Admitting: Obstetrics and Gynecology

## 2022-11-19 ENCOUNTER — Encounter: Payer: Self-pay | Admitting: Obstetrics and Gynecology

## 2022-11-19 VITALS — BP 120/77 | HR 87 | Wt 233.2 lb

## 2022-11-19 DIAGNOSIS — R87810 Cervical high risk human papillomavirus (HPV) DNA test positive: Secondary | ICD-10-CM | POA: Diagnosis not present

## 2022-11-19 DIAGNOSIS — D069 Carcinoma in situ of cervix, unspecified: Secondary | ICD-10-CM | POA: Diagnosis not present

## 2022-11-19 DIAGNOSIS — R87613 High grade squamous intraepithelial lesion on cytologic smear of cervix (HGSIL): Secondary | ICD-10-CM | POA: Insufficient documentation

## 2022-11-19 DIAGNOSIS — Z3202 Encounter for pregnancy test, result negative: Secondary | ICD-10-CM

## 2022-11-19 DIAGNOSIS — N870 Mild cervical dysplasia: Secondary | ICD-10-CM | POA: Diagnosis not present

## 2022-11-19 LAB — POCT URINE PREGNANCY: Preg Test, Ur: NEGATIVE

## 2022-11-19 NOTE — Progress Notes (Signed)
    GYNECOLOGY CLINIC COLPOSCOPY PROCEDURE NOTE  37 y.o. [id number] here for colposcopy for high-grade squamous intraepithelial neoplasia  (HGSIL-encompassing moderate and severe dysplasia) pap smear on 6/24. Discussed role for HPV in cervical dysplasia, need for surveillance.  Patient given informed consent, signed copy in the chart, time out was performed.  Placed in lithotomy position. Cervix viewed with speculum and colposcope after application of acetic acid.   Colposcopy adequate? Yes Cervix appearing irregular  acetowhite lesion(s) noted at 12 & 6 o'clock; corresponding biopsies obtained.  ECC specimen obtained. Monsel's applied. All specimens were labelled and sent to pathology.  Patient was given post procedure instructions.  Will follow up pathology and manage accordingly.  Routine preventative health maintenance measures emphasized.    Nettie Elm, MD, FACOG Attending Obstetrician & Gynecologist Center for [facility], [facility]

## 2022-11-19 NOTE — Progress Notes (Signed)
UPT - Negative

## 2022-12-04 ENCOUNTER — Encounter (HOSPITAL_COMMUNITY): Payer: Self-pay

## 2022-12-04 ENCOUNTER — Ambulatory Visit (HOSPITAL_COMMUNITY)
Admission: RE | Admit: 2022-12-04 | Discharge: 2022-12-04 | Disposition: A | Discharge status: Home / Self Care | Payer: 59 | Source: Ambulatory Visit | Attending: Nurse Practitioner | Admitting: Nurse Practitioner

## 2022-12-04 ENCOUNTER — Ambulatory Visit (HOSPITAL_COMMUNITY): Payer: 59

## 2022-12-04 VITALS — BP 120/78 | HR 62 | Temp 98.2°F | Resp 16

## 2022-12-04 DIAGNOSIS — R6883 Chills (without fever): Secondary | ICD-10-CM | POA: Insufficient documentation

## 2022-12-04 DIAGNOSIS — Z20822 Contact with and (suspected) exposure to covid-19: Secondary | ICD-10-CM | POA: Diagnosis not present

## 2022-12-04 NOTE — ED Triage Notes (Signed)
Pt states she was around someone that had covid and would like to be tested,  states she had chills today otherwise no other symptoms.

## 2022-12-04 NOTE — Discharge Instructions (Signed)
We have tested you today for COVID-19.  You will see the results in Mychart and we will call you with positive results.    Some things that can make you feel better are: - Increased rest - Increasing fluid with water/sugar free electrolytes - Acetaminophen and ibuprofen as needed for fever/pain - Salt water gargling, chloraseptic spray and throat lozenges for sore throat - OTC guaifenesin (Mucinex) 600 mg twice daily for congestion - Saline sinus flushes or a neti pot - Humidifying the air

## 2022-12-04 NOTE — ED Provider Notes (Signed)
[facility]    CSN: [id number] Arrival date & time: 12/04/22  0944      History   Chief Complaint Chief Complaint  Patient presents with   Chills    Covid symptoms - Entered by patient    HPI Holly Brown is a 37 y.o. female.   Patient presents today for COVID-19 testing due to a known exposure at work.  Reports she was exposed to the person who tested +2 days ago.  Patient reports he started having chills this morning.  No fever, body aches, cough, congestion, sore throat.  No runny or stuffy nose, chest pain, shortness of breath, ear pain, or headache.  No abdominal pain, nausea/vomiting, diarrhea, change in appetite, or decreased energy levels.  Reports her supervisor is requesting she be tested for COVID-19 today.    Past Medical History:  Diagnosis Date   Gestational diabetes    metformin - resolved after delivery   History of UTI    HSV (herpes simplex virus) infection    Migraines    Seizures (HCC)    Jan.2016 - only had one seizure, none since   Vaginal Pap smear, abnormal     Patient Active Problem List   Diagnosis Date Noted   Abnormal uterine bleeding (AUB) 09/29/2022   Abnormal Pap smear of cervix 09/29/2022   Family history of colon cancer 10/10/2015   Syncope 07/04/2014   Heart palpitations 07/04/2014    Past Surgical History:  Procedure Laterality Date   DILATION AND CURETTAGE OF UTERUS     LAPAROSCOPIC TUBAL LIGATION Bilateral 12/08/2018   Procedure: LAPAROSCOPIC TUBAL LIGATION;  Surgeon: Catalina Antigua, MD;  Location: [facility];  Service: Gynecology;  Laterality: Bilateral;   LEEP N/A 03/18/2022   Procedure: LOOP ELECTROSURGICAL EXCISION PROCEDURE (LEEP);  Surgeon: Catalina Antigua, MD;  Location: [facility];  Service: Gynecology;  Laterality: N/A;   WISDOM TOOTH EXTRACTION  2011    OB History     Gravida  5   Para  4   Term  3   Preterm  1   AB  1   Living  4      SAB  1   IAB      Ectopic       Multiple  0   Live Births  4            Home Medications    Prior to Admission medications   Medication Sig Start Date End Date Taking? Authorizing Provider  medroxyPROGESTERone Acetate 150 MG/ML SUSY Inject 1 mL (150 mg total) into the muscle every 3 (three) months. Patient not taking: Reported on 11/28/2022 10/17/22   Hermina Staggers, MD  Multiple Vitamins-Minerals (MULTIVITAMIN GUMMIES ADULT PO) Take 2 each by mouth daily.    [provider]    Family History Family History  Problem Relation Age of Onset   Colon cancer Father    Hypertension Maternal Grandmother    Diabetes Maternal Grandmother    Hypertension Paternal Grandmother    Diabetes Mother     Social History Social History   Tobacco Use   Smoking status: Former    Current packs/day: 0.25    Average packs/day: 0.3 packs/day for 11.0 years (2.7 ttl pk-yrs)    Types: Cigarettes    Start date: 12/21/2011    Passive exposure: Never   Smokeless tobacco: Never  Vaping Use   Vaping status: Never Used  Substance Use Topics   Alcohol use: Not Currently  Alcohol/week: 0.0 standard drinks of alcohol    Comment: ocassionally    Drug use: Yes    Types: Marijuana     Allergies   Patient has no known allergies.   Review of Systems Review of Systems Per HPI  Physical Exam Triage Vital Signs ED Triage Vitals  Encounter Vitals Group     BP 12/04/22 1002 120/78     Systolic BP Percentile --      Diastolic BP Percentile --      Pulse Rate 12/04/22 1000 62     Resp 12/04/22 1000 16     Temp 12/04/22 1000 98.2 F (36.8 C)     Temp Source 12/04/22 1000 Oral     SpO2 12/04/22 1000 99 %     Weight --      Height --      Head Circumference --      Peak Flow --      Pain Score 12/04/22 1001 0     Pain Loc --      Pain Education --      Exclude from Growth Chart --    No data found.  Updated Vital Signs BP 120/78 (BP Location: Right Arm)   Pulse 62   Temp 98.2 F (36.8 C) (Oral)   Resp 16    LMP  (LMP Unknown)   SpO2 99%   Visual Acuity Right Eye Distance:   Left Eye Distance:   Bilateral Distance:    Right Eye Near:   Left Eye Near:    Bilateral Near:     Physical Exam Vitals and nursing note reviewed.  Constitutional:      General: She is not in acute distress.    Appearance: Normal appearance. She is not ill-appearing or toxic-appearing.  HENT:     Head: Normocephalic and atraumatic.     Right Ear: Tympanic membrane, ear canal and external ear normal.     Left Ear: Tympanic membrane, ear canal and external ear normal.     Nose: No congestion or rhinorrhea.     Mouth/Throat:     Mouth: Mucous membranes are moist.     Pharynx: Oropharynx is clear. No oropharyngeal exudate or posterior oropharyngeal erythema.  Eyes:     General: No scleral icterus.    Extraocular Movements: Extraocular movements intact.  Cardiovascular:     Rate and Rhythm: Normal rate and regular rhythm.  Pulmonary:     Effort: Pulmonary effort is normal. No respiratory distress.     Breath sounds: Normal breath sounds. No wheezing, rhonchi or rales.  Musculoskeletal:     Cervical back: Normal range of motion and neck supple.  Lymphadenopathy:     Cervical: No cervical adenopathy.  Skin:    General: Skin is warm and dry.     Coloration: Skin is not jaundiced or pale.     Findings: No erythema or rash.  Neurological:     Mental Status: She is alert and oriented to person, place, and time.  Psychiatric:        Mood and Affect: Mood normal.        Behavior: Behavior normal.      UC Treatments / Results  Labs (all labs ordered are listed, but only abnormal results are displayed) Labs Reviewed  SARS CORONAVIRUS 2 (TAT 6-24 HRS)    EKG   Radiology No results found.  Procedures Procedures (including critical care time)  Medications Ordered in UC Medications - No data to display  Initial  Impression / Assessment and Plan / UC Course  I have reviewed the triage vital signs  and the nursing notes.  Pertinent labs & imaging results that were available during my care of the patient were reviewed by me and considered in my medical decision making (see chart for details).   Patient is well-appearing, normotensive, afebrile, not tachycardic, not tachypneic, oxygenating well on room air.    1. Exposure to COVID-19 virus 2. Chills COVID-19 testing performed Patient largely asymptomatic today, however we discussed supportive care if she becomes more symptomatic Work excuse given   The patient was given the opportunity to ask questions.  All questions answered to their satisfaction.  The patient is in agreement to this plan.    Final Clinical Impressions(s) / UC Diagnoses   Final diagnoses:  Exposure to COVID-19 virus  Chills     Discharge Instructions      We have tested you today for COVID-19.  You will see the results in Mychart and we will call you with positive results.    Some things that can make you feel better are: - Increased rest - Increasing fluid with water/sugar free electrolytes - Acetaminophen and ibuprofen as needed for fever/pain - Salt water gargling, chloraseptic spray and throat lozenges for sore throat - OTC guaifenesin (Mucinex) 600 mg twice daily for congestion - Saline sinus flushes or a neti pot - Humidifying the air    ED Prescriptions   None    PDMP not reviewed this encounter.   Valentino Nose, NP 12/04/22 1036

## 2022-12-05 ENCOUNTER — Encounter (HOSPITAL_COMMUNITY): Payer: Self-pay

## 2022-12-05 LAB — SARS CORONAVIRUS 2 (TAT 6-24 HRS): SARS Coronavirus 2: NEGATIVE

## 2022-12-05 NOTE — Progress Notes (Signed)
PCP - Dr Bennie Pierini, FNP Cardiologist - none  Chest x-ray - n/a EKG - n/a Stress Test - 08/18/14 ECHO - 07/06/14 Cardiac Cath - n/a  ICD Pacemaker/Loop - n/a  Sleep Study -  n/a CPAP - none  Diabetes - n/a   ERAS: Clear liquids til 7 AM DOS.  Anesthesia review: Yes  STOP now taking any Aspirin (unless otherwise instructed by your surgeon), Aleve, Naproxen, Ibuprofen, Motrin, Advil, Goody's, BC's, all herbal medications, fish oil, and all vitamins.   Coronavirus Screening Do you have any of the following symptoms:  Cough yes/no: No Fever (>100.74F)  yes/no: No Runny nose yes/no: No Sore throat yes/no: No Difficulty breathing/shortness of breath  yes/no: No  Have you traveled in the last 14 days and where? yes/no: No  Patient verbalized understanding of instructions that were given to them at the PAT appointment. Patient was also instructed that they will need to review over the PAT instructions again at home before surgery.

## 2022-12-05 NOTE — Progress Notes (Signed)
Surgical Instructions    Your procedure is scheduled on Tuesday, 12/16/22.  Report to [facility] Main Entrance "A" at 7:55 A.M., then check in with the Admitting office.  Call this number if you have problems the morning of surgery:  [phone]   If you have any questions prior to your surgery date call [phone]: Open Monday-Friday 8am-4pm If you experience any cold or flu symptoms such as cough, fever, chills, shortness of breath, etc. between now and your scheduled surgery, please notify us at the above number     Remember:  Do not eat or drink after midnight the night before your surgery-Monday  You may drink clear liquids until *** the morning of your surgery.   Clear liquids allowed are: Water, Non-Citrus Juices (without pulp), Carbonated Beverages, Clear Tea, Black Coffee ONLY (NO MILK, CREAM OR POWDERED CREAMER of any kind), and Gatorade    Take these medicines the morning of surgery with A SIP OF WATER:  None   As of today, STOP taking any Aspirin (unless otherwise instructed by your surgeon) Aleve, Naproxen, Ibuprofen, Motrin, Advil, Goody's, BC's, all herbal medications, fish oil, and all vitamins.           Do not wear jewelry or makeup. Do not wear lotions, powders, perfumes or deodorant. Do not shave 48 hours prior to surgery.   Do not bring valuables to the hospital. Do not wear nail polish, gel polish, artificial nails, or any other type of covering on natural nails (fingers and toes) If you have artificial nails or gel coating that need to be removed by a nail salon, please have this removed prior to surgery. Artificial nails or gel coating may interfere with anesthesia's ability to adequately monitor your vital signs.  [facility] is not responsible for any belongings or valuables.    Do NOT Smoke (Tobacco/Vaping)  24 hours prior to your procedure   Contacts, glasses, hearing aids, dentures or partials may not be worn into surgery, please bring cases for these  belongings   For patients admitted to the hospital, discharge time will be determined by your treatment team.   Patients discharged the day of surgery will not be allowed to drive home, and someone needs to stay with them for 24 hours.   SURGICAL WAITING ROOM VISITATION Patients having surgery or a procedure may have no more than 2 support people in the waiting area - these visitors may rotate.   Children under the age of 51 must have an adult with them who is not the patient. If the patient needs to stay at the hospital during part of their recovery, the visitor guidelines for inpatient rooms apply. Pre-op nurse will coordinate an appropriate time for 1 support person to accompany patient in pre-op.  This support person may not rotate.   Please refer to [email or web address] for the visitor guidelines for Inpatients (after your surgery is over and you are in a regular room).    Special instructions:    Oral Hygiene is also important to reduce your risk of infection.  Remember - BRUSH YOUR TEETH THE MORNING OF SURGERY WITH YOUR REGULAR TOOTHPASTE   [facility]- Preparing For Surgery  Before surgery, you can play an important role. Because skin is not sterile, your skin needs to be as free of germs as possible. You can reduce the number of germs on your skin by washing with CHG (chlorahexidine gluconate) Soap before surgery.  CHG is an antiseptic cleaner which kills germs and bonds  with the skin to continue killing germs even after washing.     Please do not use if you have an allergy to CHG or antibacterial soaps. If your skin becomes reddened/irritated stop using the CHG.  Do not shave (including legs and underarms) for at least 48 hours prior to first CHG shower. It is OK to shave your face.  Please follow these instructions carefully.     Shower the [employer] [facility]-Mon and the MORNING OF SURGERY-Tues with CHG Soap.   If you  chose to wash your hair, wash your hair first as usual with your normal shampoo. After you shampoo, rinse your hair and body thoroughly to remove the shampoo.  Then [employer] and genitals (private parts) with your normal soap and rinse thoroughly to remove soap.  After that Use CHG Soap as you would any other liquid soap. You can apply CHG directly to the skin and wash gently with a scrungie or a clean washcloth.   Apply the CHG Soap to your body ONLY FROM THE NECK DOWN.  Do not use on open wounds or open sores. Avoid contact with your eyes, ears, mouth and genitals (private parts). Wash Face and genitals (private parts)  with your normal soap.   Wash thoroughly, paying special attention to the area where your surgery will be performed.  Thoroughly rinse your body with warm water from the neck down.  DO NOT shower/wash with your normal soap after using and rinsing off the CHG Soap.  Pat yourself dry with a CLEAN TOWEL.  Wear CLEAN PAJAMAS to bed the night before surgery  Place CLEAN SHEETS on your bed the night before your surgery  DO NOT SLEEP WITH PETS.   Day of Surgery:  Take a shower with CHG soap. Wear Clean/Comfortable clothing the morning of surgery Do not apply any deodorants/lotions.   Remember to brush your teeth WITH YOUR REGULAR TOOTHPASTE.    If you received a COVID test during your pre-op visit, it is requested that you wear a mask when out in public, stay away from anyone that may not be feeling well, and notify your surgeon if you develop symptoms. If you have been in contact with anyone that has tested positive in the last 10 days, please notify your surgeon.    Please read over the following fact sheets that you were given.

## 2022-12-07 NOTE — H&P (Signed)
Holly Brown is an 37 y.o. female with [facility] She has tried medical treatment to no avail. She desires definite therapy. GYN U/S 200 gms.  H/O HGSIL, s/p colpo CIN 1. H/O LEEP H/O TSVD x 4 ( largest 7-8 #) H/O BTL    Menstrual History: Menarche age: 67 No LMP recorded (lmp unknown). Patient has had an injection.    Past Medical History:  Diagnosis Date   Gestational diabetes    metformin - resolved after delivery   History of UTI    HSV (herpes simplex virus) infection    Migraines    Peripheral vascular disease (HCC)    Seizures (HCC)    Jan.2016 - only had one seizure, none since   Vaginal Pap smear, abnormal     Past Surgical History:  Procedure Laterality Date   DILATION AND CURETTAGE OF UTERUS     LAPAROSCOPIC TUBAL LIGATION Bilateral 12/08/2018   Procedure: LAPAROSCOPIC TUBAL LIGATION;  Surgeon: Catalina Antigua, MD;  Location: [facility];  Service: Gynecology;  Laterality: Bilateral;   LEEP N/A 03/18/2022   Procedure: LOOP ELECTROSURGICAL EXCISION PROCEDURE (LEEP);  Surgeon: Catalina Antigua, MD;  [facility];  Service: Gynecology;  Laterality: N/A;   WISDOM TOOTH EXTRACTION  2011    Family History  Problem Relation Age of Onset   Colon cancer Father    Hypertension Maternal Grandmother    Diabetes Maternal Grandmother    Hypertension Paternal Grandmother    Diabetes Mother     Social History:  reports that she has quit smoking. Her smoking use included cigarettes. She started smoking about 10 years ago. She has a 2.7 pack-year smoking history. She has never been exposed to tobacco smoke. She has never used smokeless tobacco. She reports that she does not currently use alcohol. She reports current drug use. Drug: Marijuana.  Allergies: No Known Allergies  No medications prior to admission.    Review of Systems  Constitutional: Negative.   Respiratory: Negative.    Cardiovascular: Negative.   Gastrointestinal: Negative.    Genitourinary:  Positive for menstrual problem.    There were no vitals taken for this visit. Physical Exam Cardiovascular:     Rate and Rhythm: Normal rate and regular rhythm.  Pulmonary:     Effort: Pulmonary effort is normal.     Breath sounds: Normal breath sounds.  Abdominal:     General: Bowel sounds are normal.     Palpations: Abdomen is soft.  Genitourinary:    Comments: Nl EGBUS, small mobile uterus, no masses or tenderness Neurological:     Mental Status: She is alert.     No results found for this or any previous visit (from the past 24 hour(s)).  No results found.  Assessment/Plan: [facility]  Pt desires definite therapy. TVH with possible BS. R/B/Post op care reviewed.  Pt has verbalized understanding and desires to proceed.   Hermina Staggers 12/07/2022, 12:13 PM

## 2022-12-08 ENCOUNTER — Other Ambulatory Visit: Payer: Self-pay

## 2022-12-08 ENCOUNTER — Inpatient Hospital Stay (HOSPITAL_COMMUNITY): Admission: RE | Admit: 2022-12-08 | Payer: 59 | Source: Ambulatory Visit

## 2022-12-08 ENCOUNTER — Encounter (HOSPITAL_COMMUNITY): Payer: Self-pay

## 2022-12-08 DIAGNOSIS — Z01818 Encounter for other preprocedural examination: Secondary | ICD-10-CM | POA: Diagnosis present

## 2022-12-08 DIAGNOSIS — N939 Abnormal uterine and vaginal bleeding, unspecified: Secondary | ICD-10-CM

## 2022-12-08 HISTORY — DX: Peripheral vascular disease, unspecified: I73.9

## 2022-12-08 LAB — BASIC METABOLIC PANEL
Anion gap: 12 (ref 5–15)
BUN: 9 mg/dL (ref 6–20)
CO2: 22 mmol/L (ref 22–32)
Calcium: 8.9 mg/dL (ref 8.9–10.3)
Chloride: 103 mmol/L (ref 98–111)
Creatinine, Ser: 0.95 mg/dL (ref 0.44–1.00)
GFR, Estimated: >60 mL/min (ref >60)
Glucose, Bld: 90 mg/dL (ref 70–99)
Potassium: 3.7 mmol/L (ref 3.5–5.1)
Sodium: 137 mmol/L (ref 135–145)

## 2022-12-08 LAB — CBC
HCT: 40.3 % (ref 36.0–46.0)
Hemoglobin: 12.9 g/dL (ref 12.0–15.0)
MCH: 29.7 pg (ref 26.0–34.0)
MCHC: 32 g/dL (ref 30.0–36.0)
MCV: 92.9 fL (ref 80.0–100.0)
Platelets: 416 10*3/uL — ABNORMAL HIGH (ref 150–400)
RBC: 4.34 MIL/uL (ref 3.87–5.11)
RDW: 13 % (ref 11.5–15.5)
WBC: 11.5 10*3/uL — ABNORMAL HIGH (ref 4.0–10.5)
nRBC: 0 % (ref 0.0–0.2)

## 2022-12-08 LAB — TYPE AND SCREEN
ABO/RH(D): A POS
Antibody Screen: NEGATIVE

## 2022-12-10 ENCOUNTER — Encounter: Payer: Self-pay | Admitting: Family

## 2022-12-10 ENCOUNTER — Ambulatory Visit (INDEPENDENT_AMBULATORY_CARE_PROVIDER_SITE_OTHER): Payer: 59 | Admitting: Family

## 2022-12-10 VITALS — BP 112/70 | HR 75 | Temp 98.3°F | Ht 71.0 in | Wt 234.8 lb

## 2022-12-10 DIAGNOSIS — M25562 Pain in left knee: Secondary | ICD-10-CM

## 2022-12-10 DIAGNOSIS — R200 Anesthesia of skin: Secondary | ICD-10-CM | POA: Diagnosis not present

## 2022-12-10 DIAGNOSIS — M792 Neuralgia and neuritis, unspecified: Secondary | ICD-10-CM | POA: Diagnosis not present

## 2022-12-10 DIAGNOSIS — R87613 High grade squamous intraepithelial lesion on cytologic smear of cervix (HGSIL): Secondary | ICD-10-CM | POA: Diagnosis not present

## 2022-12-10 DIAGNOSIS — G8929 Other chronic pain: Secondary | ICD-10-CM

## 2022-12-10 DIAGNOSIS — N939 Abnormal uterine and vaginal bleeding, unspecified: Secondary | ICD-10-CM

## 2022-12-10 DIAGNOSIS — Z87898 Personal history of other specified conditions: Secondary | ICD-10-CM

## 2022-12-10 DIAGNOSIS — Z800 Family history of malignant neoplasm of digestive organs: Secondary | ICD-10-CM

## 2022-12-10 DIAGNOSIS — Z13220 Encounter for screening for lipoid disorders: Secondary | ICD-10-CM

## 2022-12-10 NOTE — Assessment & Plan Note (Signed)
Advised compression sleeve, ibuprofen , voltaren gel prn  Ice to site.  Elevation when episode.  Ddx runners knee Xray if no improvement with conservative measures

## 2022-12-10 NOTE — Assessment & Plan Note (Signed)
Subacute  B12 ordered today pending results.  Physical exam findings unremarkable, not in current episode.  Referral placed for neurology  Ddx cervicalgia, neuralgia, MS

## 2022-12-10 NOTE — Assessment & Plan Note (Signed)
F/u with gyn as scheduled  Surgery pending

## 2022-12-10 NOTE — Patient Instructions (Signed)
A referral was placed today for neurology. Please let us know if you have not heard back within 2 weeks about the referral.  Stop by the lab prior to leaving today. I will notify you of your results once received.    Regards,   Eugenia Pancoast FNP-C

## 2022-12-10 NOTE — Progress Notes (Signed)
New Patient Office Visit  Subjective:  Patient ID: Holly Brown, female    DOB: 03-23-1986  Age: 37 y.o. MRN: [id number]  CC:  Chief Complaint  Patient presents with   Establish Care    Lt side and some of the arm will go numb at times, Lt knee poppin along with some pain    HPI Holly Brown is here to establish care as a new patient.  Oriented to practice routines and expectations.  Prior provider was: has not had one since age 83  [place] go to GYN in [place]   Pt is with acute concerns.  Went into urgent care 12/20/22 with left axillary abscess. She states ever since on and off she will feel numbness in her left arm mainly under her left upper arm, bicep region.  She will have days where she will feel as though her entire left arm is 'dead' and will be hard to move when this occurs. She will have to lift her arm up to move it , this has been occurring about twice weekly. episodes will last for about 5 minutes or so.  Has been increasing in frequency since march 2024. Denies neck pain or radiculopathy. No injury.   Left knee, hard to run greater than five minutes without some pain. At times will swell up and then go back down, she will take tylenol with some relief.  No known history of injury to left knee.   chronic concerns:  AUB : currently on depo to help to manage her heavy periods, has tried ocps without much relief. She is pending a surgery for a hysterecomy 12/16/22 with dr. Francesca Jewett.  Suspected to be partial but depending on surgery may be total.  Pap June 2024 with HSIL with positive HPV  Did have colposcopy , cervix appeared irregular     ROS: Negative unless specifically indicated above in HPI.   Current Outpatient Medications:    medroxyPROGESTERone Acetate 150 MG/ML SUSY, Inject 1 mL (150 mg total) into the muscle every 3 (three) months., Disp: 1 mL, Rfl: 0   Multiple Vitamins-Minerals (MULTIVITAMIN GUMMIES ADULT PO), Take 2 each by mouth daily., Disp: , Rfl:   Past Medical History:  Diagnosis Date   Gestational diabetes    metformin - resolved after delivery   History of UTI    HSV (herpes simplex virus) infection    Migraines    Peripheral vascular disease (HCC)    Seizures (HCC)    Jan.2016 - only had one seizure, none since   Vaginal Pap smear, abnormal    Past Surgical History:  Procedure Laterality Date   DILATION AND CURETTAGE OF UTERUS     LAPAROSCOPIC TUBAL LIGATION Bilateral 12/08/2018   Procedure: LAPAROSCOPIC TUBAL LIGATION;  Surgeon: Catalina Antigua, MD;  Location: [facility];  Service: Gynecology;  Laterality: Bilateral;   LEEP N/A 03/18/2022   Procedure: LOOP ELECTROSURGICAL EXCISION PROCEDURE (LEEP);  Surgeon: Catalina Antigua, MD;  [facility];  Service: Gynecology;  Laterality: N/A;   WISDOM TOOTH EXTRACTION  04/21/2009    Objective:   Today's Vitals: BP 112/70   Pulse 75   Temp 98.3 F (36.8 C)   Ht 5\' 11"  (1.803 m)   Wt 234 lb 12.8 oz (106.5 kg)   LMP  (LMP Unknown) Comment: Depo Injections - last injection 10/2022, had spotting on and off since 10/2022 injection  SpO2 99%   BMI 32.75 kg/m   Physical Exam Constitutional:  General: She is not in acute distress.    Appearance: Normal appearance. She is normal weight. She is not ill-appearing, toxic-appearing or diaphoretic.  HENT:     Head: Normocephalic.  Cardiovascular:     Rate and Rhythm: Normal rate.  Pulmonary:     Effort: Pulmonary effort is normal.  Musculoskeletal:        General: Normal range of motion.     Left hand: No swelling, tenderness or bony tenderness. Normal range of motion. Normal strength.     Left knee: Effusion (small superior patella) present. Normal range of motion.  Neurological:     General: No focal deficit present.     Mental Status: She is alert and oriented to person, place, and time. Mental status is at baseline.     Cranial Nerves: Cranial nerves 2-12 are intact. No facial asymmetry.     Sensory:  Sensation is intact.     Motor: Motor function is intact. No weakness.     Coordination: Coordination is intact.     Gait: Gait is intact.  Psychiatric:        Mood and Affect: Mood normal.        Behavior: Behavior normal.        Thought Content: Thought content normal.        Judgment: Judgment normal.     Assessment & Plan:  History of syncope  Screening for lipoid disorders  Neuralgia -     Vitamin B12 -     Ambulatory referral to Neurology  Left arm numbness Assessment & Plan: Subacute  B12 ordered today pending results.  Physical exam findings unremarkable, not in current episode.  Referral placed for neurology  Ddx cervicalgia, neuralgia, MS  Orders: -     Ambulatory referral to Neurology  Abnormal uterine bleeding (AUB) Assessment & Plan: F/u with gyn as scheduled  Surgery pending    High grade squamous intraepithelial lesion (HGSIL) on cytologic smear of cervix Assessment & Plan: Reviewed. HSIL HPV positive 6/24.  Continue ongoing f/u with gyn   Family history of colon cancer  Chronic pain of left knee Assessment & Plan: Advised compression sleeve, ibuprofen , voltaren gel prn  Ice to site.  Elevation when episode.  Ddx runners knee Xray if no improvement with conservative measures     Follow-up: Return in about 3 months (around 03/12/2023) for f/u CPE.   Mort Sawyers, FNP

## 2022-12-10 NOTE — Assessment & Plan Note (Signed)
Reviewed. HSIL HPV positive 6/24.  Continue ongoing f/u with gyn

## 2022-12-12 LAB — VITAMIN B12: Vitamin B-12: 616 pg/mL (ref 211–911)

## 2022-12-16 ENCOUNTER — Observation Stay (HOSPITAL_COMMUNITY): Payer: 59 | Admitting: Physician Assistant

## 2022-12-16 ENCOUNTER — Other Ambulatory Visit: Payer: Self-pay

## 2022-12-16 ENCOUNTER — Observation Stay (HOSPITAL_COMMUNITY)
Admission: RE | Admit: 2022-12-16 | Discharge: 2022-12-18 | Disposition: A | Discharge status: Home / Self Care | Payer: 59 | Source: Ambulatory Visit | Attending: Obstetrics and Gynecology | Admitting: Obstetrics and Gynecology

## 2022-12-16 ENCOUNTER — Encounter (HOSPITAL_COMMUNITY)
Admission: RE | Disposition: A | Discharge status: Home / Self Care | Payer: Self-pay | Source: Ambulatory Visit | Attending: Obstetrics and Gynecology

## 2022-12-16 ENCOUNTER — Observation Stay (HOSPITAL_BASED_OUTPATIENT_CLINIC_OR_DEPARTMENT_OTHER): Payer: 59 | Admitting: Anesthesiology

## 2022-12-16 ENCOUNTER — Encounter (HOSPITAL_COMMUNITY): Payer: Self-pay | Admitting: Obstetrics and Gynecology

## 2022-12-16 DIAGNOSIS — D259 Leiomyoma of uterus, unspecified: Principal | ICD-10-CM | POA: Insufficient documentation

## 2022-12-16 DIAGNOSIS — N920 Excessive and frequent menstruation with regular cycle: Secondary | ICD-10-CM | POA: Insufficient documentation

## 2022-12-16 DIAGNOSIS — C539 Malignant neoplasm of cervix uteri, unspecified: Secondary | ICD-10-CM | POA: Diagnosis not present

## 2022-12-16 DIAGNOSIS — N939 Abnormal uterine and vaginal bleeding, unspecified: Secondary | ICD-10-CM

## 2022-12-16 DIAGNOSIS — Z87891 Personal history of nicotine dependence: Secondary | ICD-10-CM | POA: Insufficient documentation

## 2022-12-16 DIAGNOSIS — N9489 Other specified conditions associated with female genital organs and menstrual cycle: Secondary | ICD-10-CM

## 2022-12-16 DIAGNOSIS — Z9889 Other specified postprocedural states: Secondary | ICD-10-CM

## 2022-12-16 HISTORY — PX: VAGINAL HYSTERECTOMY: SHX2639

## 2022-12-16 LAB — SURGICAL PCR SCREEN
MRSA, PCR: NEGATIVE
Staphylococcus aureus: NEGATIVE

## 2022-12-16 LAB — POCT PREGNANCY, URINE: Preg Test, Ur: NEGATIVE

## 2022-12-16 SURGERY — HYSTERECTOMY, VAGINAL
Anesthesia: General | Site: Vagina

## 2022-12-16 MED ORDER — DEXAMETHASONE SODIUM PHOSPHATE 10 MG/ML IJ SOLN
INTRAMUSCULAR | Status: DC | PRN
  Administered 2022-12-16: 5 mg via INTRAVENOUS

## 2022-12-16 MED ORDER — OXYCODONE-ACETAMINOPHEN 5-325 MG PO TABS
2.0000 | ORAL_TABLET | ORAL | Status: DC | PRN
  Administered 2022-12-17: 2 via ORAL
  Filled 2022-12-16: qty 2

## 2022-12-16 MED ORDER — LACTATED RINGERS IV SOLN: INTRAVENOUS | Status: DC

## 2022-12-16 MED ORDER — 0.9 % SODIUM CHLORIDE (POUR BTL) OPTIME
TOPICAL | Status: DC | PRN
  Administered 2022-12-16: 1000 mL

## 2022-12-16 MED ORDER — CHLORHEXIDINE GLUCONATE 0.12 % MT SOLN
15.0000 mL | OROMUCOSAL | Status: AC
  Filled 2022-12-16: qty 15

## 2022-12-16 MED ORDER — FENTANYL CITRATE (PF) 250 MCG/5ML IJ SOLN
INTRAMUSCULAR | Status: AC
  Filled 2022-12-16: qty 5

## 2022-12-16 MED ORDER — ZOLPIDEM TARTRATE 5 MG PO TABS: 5.0000 mg | ORAL_TABLET | Freq: Every evening | ORAL | Status: DC | PRN

## 2022-12-16 MED ORDER — ACETAMINOPHEN 500 MG PO TABS
1000.0000 mg | ORAL_TABLET | ORAL | Status: AC
  Administered 2022-12-16: 1000 mg via ORAL
  Filled 2022-12-16: qty 2

## 2022-12-16 MED ORDER — HYDROMORPHONE HCL 1 MG/ML IJ SOLN
0.5000 mg | INTRAMUSCULAR | Status: AC | PRN
  Administered 2022-12-16 (×2): 0.5 mg via INTRAVENOUS

## 2022-12-16 MED ORDER — DEXAMETHASONE SODIUM PHOSPHATE 10 MG/ML IJ SOLN
INTRAMUSCULAR | Status: AC
  Filled 2022-12-16: qty 1

## 2022-12-16 MED ORDER — PROPOFOL 10 MG/ML IV BOLUS
INTRAVENOUS | Status: DC | PRN
  Administered 2022-12-16: 180 mg via INTRAVENOUS

## 2022-12-16 MED ORDER — PROPOFOL 500 MG/50ML IV EMUL
INTRAVENOUS | Status: DC | PRN
  Administered 2022-12-16: 150 ug/kg/min via INTRAVENOUS

## 2022-12-16 MED ORDER — ONDANSETRON HCL 4 MG/2ML IJ SOLN
INTRAMUSCULAR | Status: DC | PRN
  Administered 2022-12-16: 4 mg via INTRAVENOUS

## 2022-12-16 MED ORDER — CHLORHEXIDINE GLUCONATE 0.12 % MT SOLN
OROMUCOSAL | Status: AC
  Administered 2022-12-16: 15 mL via OROMUCOSAL
  Filled 2022-12-16: qty 15

## 2022-12-16 MED ORDER — ONDANSETRON HCL 4 MG PO TABS: 4.0000 mg | ORAL_TABLET | Freq: Four times a day (QID) | ORAL | Status: DC | PRN

## 2022-12-16 MED ORDER — OXYCODONE-ACETAMINOPHEN 5-325 MG PO TABS: 1.0000 | ORAL_TABLET | ORAL | Status: DC | PRN

## 2022-12-16 MED ORDER — AMISULPRIDE (ANTIEMETIC) 5 MG/2ML IV SOLN
INTRAVENOUS | Status: AC
  Filled 2022-12-16: qty 4

## 2022-12-16 MED ORDER — LIDOCAINE 2% (20 MG/ML) 5 ML SYRINGE
INTRAMUSCULAR | Status: DC | PRN
  Administered 2022-12-16: 80 mg via INTRAVENOUS

## 2022-12-16 MED ORDER — IBUPROFEN 800 MG PO TABS
800.0000 mg | ORAL_TABLET | Freq: Three times a day (TID) | ORAL | Status: DC
  Administered 2022-12-16 – 2022-12-18 (×6): 800 mg via ORAL
  Filled 2022-12-16 (×6): qty 1

## 2022-12-16 MED ORDER — HYDROMORPHONE HCL 1 MG/ML IJ SOLN
1.0000 mg | INTRAMUSCULAR | Status: DC | PRN
  Administered 2022-12-16 – 2022-12-17 (×5): 1 mg via INTRAVENOUS
  Filled 2022-12-16 (×7): qty 1

## 2022-12-16 MED ORDER — FENTANYL CITRATE (PF) 100 MCG/2ML IJ SOLN
25.0000 ug | INTRAMUSCULAR | Status: DC | PRN
  Administered 2022-12-16 (×3): 50 ug via INTRAVENOUS

## 2022-12-16 MED ORDER — ONDANSETRON HCL 4 MG/2ML IJ SOLN
4.0000 mg | Freq: Four times a day (QID) | INTRAMUSCULAR | Status: DC | PRN
  Administered 2022-12-16 – 2022-12-17 (×3): 4 mg via INTRAVENOUS
  Filled 2022-12-16 (×3): qty 2

## 2022-12-16 MED ORDER — ONDANSETRON HCL 4 MG/2ML IJ SOLN: 4.0000 mg | Freq: Once | INTRAMUSCULAR | Status: DC | PRN

## 2022-12-16 MED ORDER — FENTANYL CITRATE (PF) 100 MCG/2ML IJ SOLN
INTRAMUSCULAR | Status: AC
  Filled 2022-12-16: qty 2

## 2022-12-16 MED ORDER — MIDAZOLAM HCL 2 MG/2ML IJ SOLN
INTRAMUSCULAR | Status: AC
  Filled 2022-12-16: qty 2

## 2022-12-16 MED ORDER — KETOROLAC TROMETHAMINE 15 MG/ML IJ SOLN
INTRAMUSCULAR | Status: AC
  Filled 2022-12-16: qty 1

## 2022-12-16 MED ORDER — ROCURONIUM BROMIDE 10 MG/ML (PF) SYRINGE
PREFILLED_SYRINGE | INTRAVENOUS | Status: DC | PRN
  Administered 2022-12-16: 60 mg via INTRAVENOUS

## 2022-12-16 MED ORDER — SOD CITRATE-CITRIC ACID 500-334 MG/5ML PO SOLN
ORAL | Status: AC
  Administered 2022-12-16: 30 mL via ORAL
  Filled 2022-12-16: qty 30

## 2022-12-16 MED ORDER — SCOPOLAMINE 1 MG/3DAYS TD PT72
1.0000 | MEDICATED_PATCH | Freq: Once | TRANSDERMAL | Status: DC
  Administered 2022-12-16: 1.5 mg via TRANSDERMAL
  Filled 2022-12-16: qty 1

## 2022-12-16 MED ORDER — ONDANSETRON HCL 4 MG/2ML IJ SOLN
INTRAMUSCULAR | Status: AC
  Filled 2022-12-16: qty 2

## 2022-12-16 MED ORDER — FENTANYL CITRATE (PF) 250 MCG/5ML IJ SOLN
INTRAMUSCULAR | Status: DC | PRN
  Administered 2022-12-16: 50 ug via INTRAVENOUS

## 2022-12-16 MED ORDER — LIDOCAINE 2% (20 MG/ML) 5 ML SYRINGE
INTRAMUSCULAR | Status: AC
  Filled 2022-12-16: qty 5

## 2022-12-16 MED ORDER — SIMETHICONE 80 MG PO CHEW
80.0000 mg | CHEWABLE_TABLET | Freq: Four times a day (QID) | ORAL | Status: DC | PRN
  Administered 2022-12-18: 80 mg via ORAL
  Filled 2022-12-16: qty 1

## 2022-12-16 MED ORDER — SUGAMMADEX SODIUM 200 MG/2ML IV SOLN
INTRAVENOUS | Status: DC | PRN
  Administered 2022-12-16: 225 mg via INTRAVENOUS

## 2022-12-16 MED ORDER — KETOROLAC TROMETHAMINE 30 MG/ML IJ SOLN
30.0000 mg | Freq: Four times a day (QID) | INTRAMUSCULAR | Status: AC
  Administered 2022-12-16 – 2022-12-17 (×3): 30 mg via INTRAVENOUS
  Filled 2022-12-16 (×3): qty 1

## 2022-12-16 MED ORDER — SOD CITRATE-CITRIC ACID 500-334 MG/5ML PO SOLN
30.0000 mL | ORAL | Status: AC
  Filled 2022-12-16: qty 30

## 2022-12-16 MED ORDER — CEFAZOLIN SODIUM-DEXTROSE 2-4 GM/100ML-% IV SOLN
2.0000 g | INTRAVENOUS | Status: AC
  Administered 2022-12-16: 2 g via INTRAVENOUS
  Filled 2022-12-16: qty 100

## 2022-12-16 MED ORDER — SENNA 8.6 MG PO TABS
1.0000 | ORAL_TABLET | Freq: Two times a day (BID) | ORAL | Status: DC
  Administered 2022-12-16 – 2022-12-18 (×5): 8.6 mg via ORAL
  Filled 2022-12-16 (×5): qty 1

## 2022-12-16 MED ORDER — ROCURONIUM BROMIDE 10 MG/ML (PF) SYRINGE
PREFILLED_SYRINGE | INTRAVENOUS | Status: AC
  Filled 2022-12-16: qty 10

## 2022-12-16 MED ORDER — PHENYLEPHRINE 80 MCG/ML (10ML) SYRINGE FOR IV PUSH (FOR BLOOD PRESSURE SUPPORT)
PREFILLED_SYRINGE | INTRAVENOUS | Status: DC | PRN
  Administered 2022-12-16: 160 ug via INTRAVENOUS

## 2022-12-16 MED ORDER — MIDAZOLAM HCL 2 MG/2ML IJ SOLN
INTRAMUSCULAR | Status: DC | PRN
  Administered 2022-12-16: 2 mg via INTRAVENOUS

## 2022-12-16 MED ORDER — HYDROMORPHONE HCL 1 MG/ML IJ SOLN
INTRAMUSCULAR | Status: AC
  Filled 2022-12-16: qty 1

## 2022-12-16 MED ORDER — HYDROMORPHONE HCL 1 MG/ML IJ SOLN
INTRAMUSCULAR | Status: AC
  Filled 2022-12-16: qty 0.5

## 2022-12-16 MED ORDER — POVIDONE-IODINE 10 % EX SWAB: 2.0000 | Freq: Once | CUTANEOUS | Status: DC

## 2022-12-16 MED ORDER — HYDROMORPHONE HCL 1 MG/ML IJ SOLN
INTRAMUSCULAR | Status: DC | PRN
  Administered 2022-12-16: .5 mg via INTRAVENOUS

## 2022-12-16 MED ORDER — HYDROMORPHONE HCL 1 MG/ML IJ SOLN
INTRAMUSCULAR | Status: AC
  Administered 2022-12-16: 1 mg
  Filled 2022-12-16: qty 1

## 2022-12-16 MED ORDER — KETOROLAC TROMETHAMINE 30 MG/ML IJ SOLN
30.0000 mg | INTRAMUSCULAR | Status: AC
  Administered 2022-12-16: 30 mg via INTRAVENOUS
  Filled 2022-12-16: qty 1

## 2022-12-16 MED ORDER — AMISULPRIDE (ANTIEMETIC) 5 MG/2ML IV SOLN
10.0000 mg | Freq: Once | INTRAVENOUS | Status: AC | PRN
  Administered 2022-12-16: 10 mg via INTRAVENOUS

## 2022-12-16 SURGICAL SUPPLY — 24 items
BAG COUNTER SPONGE SURGICOUNT (BAG) ×2 IMPLANT
BAG SPNG CNTER NS LX DISP (BAG) ×1
BLADE SURG 10 STRL SS (BLADE) ×2 IMPLANT
GAUZE 4X4 16PLY ~~LOC~~+RFID DBL (SPONGE) ×4 IMPLANT
GLOVE BIO SURGEON STRL SZ7.5 (GLOVE) ×2 IMPLANT
GLOVE SURG UNDER POLY LF SZ7 (GLOVE) ×2 IMPLANT
GOWN STRL REUS W/ TWL LRG LVL3 (GOWN DISPOSABLE) ×4 IMPLANT
GOWN STRL REUS W/ TWL XL LVL3 (GOWN DISPOSABLE) ×4 IMPLANT
GOWN STRL REUS W/TWL LRG LVL3 (GOWN DISPOSABLE) ×2
GOWN STRL REUS W/TWL XL LVL3 (GOWN DISPOSABLE) ×2
HIBICLENS CHG 4% 4OZ BTL (MISCELLANEOUS) ×2 IMPLANT
KIT TURNOVER KIT B (KITS) ×2 IMPLANT
MANIFOLD NEPTUNE II (INSTRUMENTS) ×2 IMPLANT
NS IRRIG 1000ML POUR BTL (IV SOLUTION) ×2 IMPLANT
PACK VAGINAL WOMENS (CUSTOM PROCEDURE TRAY) ×2 IMPLANT
PAD OB MATERNITY 4.3X12.25 (PERSONAL CARE ITEMS) ×2 IMPLANT
SUT VIC AB 2-0 CT1 18 (SUTURE) ×2 IMPLANT
SUT VIC AB 2-0 CT1 27 (SUTURE) ×1
SUT VIC AB 2-0 CT1 TAPERPNT 27 (SUTURE) ×2 IMPLANT
SUT VIC AB PLUS 45CM 1-MO-4 (SUTURE) ×4 IMPLANT
SUT VICRYL 1 TIES 12X18 (SUTURE) ×2 IMPLANT
TOWEL GREEN STERILE FF (TOWEL DISPOSABLE) ×2 IMPLANT
TRAY FOLEY W/BAG SLVR 14FR (SET/KITS/TRAYS/PACK) ×2 IMPLANT
UNDERPAD 30X36 HEAVY ABSORB (UNDERPADS AND DIAPERS) ×2 IMPLANT

## 2022-12-16 NOTE — Anesthesia Procedure Notes (Signed)
Procedure Name: Intubation Date/Time: 12/16/2022 9:49 AM  Performed by: Earlene Plater, CRNAPre-anesthesia Checklist: Patient identified, Emergency Drugs available, Suction available, Patient being monitored and Timeout performed Patient Re-evaluated:Patient Re-evaluated prior to induction Oxygen Delivery Method: Circle system utilized Preoxygenation: Pre-oxygenation with 100% oxygen Induction Type: IV induction Ventilation: Mask ventilation without difficulty Laryngoscope Size: Mac and 4 Grade View: Grade II Tube type: Oral Tube size: 7.0 mm Number of attempts: 1 Airway Equipment and Method: Stylet and Bite block Placement Confirmation: ETT inserted through vocal cords under direct vision, positive ETCO2, CO2 detector and breath sounds checked- equal and bilateral Secured at: 22 cm Tube secured with: Tape Dental Injury: Teeth and Oropharynx as per pre-operative assessment  Comments: Clear/ equal breath sounds

## 2022-12-16 NOTE — Op Note (Signed)
Holly Brown PROCEDURE DATE: 12/16/2022  PREOPERATIVE DIAGNOSIS:  Symptomatic fibroids, menorrhagia POSTOPERATIVE DIAGNOSIS:  Symptomatic fibroids, menorrhagia SURGEON:   Nettie Elm, M.D. Holly Brown, M.D.  An experienced assistant was required given the standard of surgical care given the complexity of the case.  This assistant was needed for exposure, dissection, suctioning, retraction, instrument exchange, and for overall help during the procedure.    OPERATION:  Total Vaginal hysterectomy with bilateral salpingectomy ANESTHESIA:  General endotracheal.  INDICATIONS: The patient is a 37 y.o. [id number] with history of symptomatic uterine fibroids/menorrhagia. The patient made a decision to undergo definite surgical treatment. On the preoperative visit, the risks, benefits, indications, and alternatives of the procedure were reviewed with the patient.  On the day of surgery, the risks of surgery were again discussed with the patient including but not limited to: bleeding which may require transfusion or reoperation; infection which may require antibiotics; injury to bowel, bladder, ureters or other surrounding organs; need for additional procedures; thromboembolic phenomenon, incisional problems and other postoperative/anesthesia complications. Written informed consent was obtained.    OPERATIVE FINDINGS: A 10 week size uterus with normal tubes and ovaries bilaterally.  ESTIMATED BLOOD LOSS: 250 ml FLUIDS:  As recorded URINE OUTPUT:  As recorded. Please note urine dark and concentrated appearing pre and post surgery SPECIMENS:  Uterus and cervix and portion of tubes sent to pathology COMPLICATIONS:  None immediate.  DESCRIPTION OF PROCEDURE:  The patient received intravenous antibiotics and had sequential compression devices applied to her lower extremities while in the preoperative area.  She was then taken to the operating room where general anesthesia was administered and was  found to be adequate.  She was placed in the dorsal lithotomy position, and was prepped and draped in a sterile manner.  A Foley catheter was inserted into her bladder and attached to constant drainage. After an adequate timeout was performed, attention was turned to her pelvis.  A weighted speculum was then placed in the vagina, and the  posterior lip of the cervix was grasped with tenaculum.  The posterior cul de sac was then entered sharply without problems. Long bill weighted speculum was placed. The anterior lip of the cervix was grasped with tenaculum.  The cervix was then circumferentially incised, and the bladder was dissected off the pubocervical fascia anteriorly without complication.  The anterior cul-de-sac was then entered sharply without difficulty and a retractor was placed.  Zeplin clamps were then used to clamp the uterosacral ligaments on either side.  They were then cut and sutured ligated with 0 Vicryl.  Of note, all sutures used in this case were 0 Vicryl unless otherwise noted.   The cardinal ligaments were then clamped, cut and ligated. The uterine vessels and broad ligaments were then serially clamped with the Heaney clamps, cut, and suture ligated on both sides.  Excellent hemostasis was noted at this point.  The uterus was then morcellation to allow better exposure of the IP. The IP on each side was then clamped, cut and ligated with a free tie and then in a Borden fashion. The uterus was then passed off for pathology. Each tube was identified and grasped, clamped, cut and ligated with a free tie.  After completion of the hysterectomy, all pedicles from the uterosacral ligament to the cornua were examined hemostasis was confirmed.  The peritoneum was then closed with 2/0 Vicryl in a purse string fashion. The vaginal cuff was then closed with 2/0 Vicryl.   All instruments were then  removed from the pelvis. The patient tolerated the procedure well.  All instruments, needles, and sponge counts  were correct x 2. The patient was taken to the recovery room in stable condition.    Nettie Elm, MD, FACOG Attending Obstetrician & Gynecologist Faculty Practice, [facility]

## 2022-12-16 NOTE — Anesthesia Preprocedure Evaluation (Addendum)
Anesthesia Evaluation  Patient identified by MRN, date of birth, ID band Patient awake    Reviewed: Allergy & Precautions, NPO status , Patient's Chart, lab work & pertinent test results  Airway Mallampati: II  TM Distance: >3 FB Neck ROM: Full    Dental  (+) Dental Advisory Given, Missing   Pulmonary former smoker   Pulmonary exam normal breath sounds clear to auscultation       Cardiovascular + Peripheral Vascular Disease  Normal cardiovascular exam Rhythm:Regular Rate:Normal     Neuro/Psych  Headaches, Seizures -, Well Controlled,     GI/Hepatic negative GI ROS, Neg liver ROS,,,  Endo/Other  diabetes    Renal/GU negative Renal ROS     Musculoskeletal negative musculoskeletal ROS (+)    Abdominal   Peds  Hematology negative hematology ROS (+)   Anesthesia Other Findings Day of surgery medications reviewed with the patient.  Reproductive/Obstetrics AUB                             Anesthesia Physical Anesthesia Plan  ASA: 2  Anesthesia Plan: General   Post-op Pain Management: Tylenol PO (pre-op)* and Toradol IV (intra-op)*   Induction: Intravenous  PONV Risk Score and Plan: 4 or greater and Scopolamine patch - Pre-op, Midazolam, Dexamethasone and Ondansetron  Airway Management Planned: Oral ETT  Additional Equipment:   Intra-op Plan:   Post-operative Plan: Extubation in OR  Informed Consent: I have reviewed the patients History and Physical, chart, labs and discussed the procedure including the risks, benefits and alternatives for the proposed anesthesia with the patient or authorized representative who has indicated his/her understanding and acceptance.     Dental advisory given  Plan Discussed with: CRNA  Anesthesia Plan Comments:        Anesthesia Quick Evaluation

## 2022-12-16 NOTE — Interval H&P Note (Signed)
History and Physical Interval Note:  12/16/2022 9:01 AM  Holly Brown  has presented today for surgery, with the diagnosis of AUB.  The various methods of treatment have been discussed with the patient and family. After consideration of risks, benefits and other options for treatment, the patient has consented to  Procedure(s): TOTAL VAGINAL HYSTERECTOMY POSSIBLE SALPINGECTOMY (N/A) as a surgical intervention.  The patient's history has been reviewed, patient examined, no change in status, stable for surgery.  I have reviewed the patient's chart and labs.  Questions were answered to the patient's satisfaction.     Hermina Staggers

## 2022-12-16 NOTE — Transfer of Care (Signed)
Immediate Anesthesia Transfer of Care Note  Patient: Holly Brown  Procedure(s) Performed: TOTAL VAGINAL HYSTERECTOMY, BILATERAL SALPINGECTOMY (Vagina )  Patient Location: PACU  Anesthesia Type:General  Level of Consciousness: drowsy, patient cooperative, and responds to stimulation  Airway & Oxygen Therapy: Patient Spontanous Breathing and Patient connected to face mask oxygen  Post-op Assessment: Report given to RN and Post -op Vital signs reviewed and stable  Post vital signs: Reviewed and stable  Last Vitals:  Vitals Value Taken Time  BP 155/91 12/16/22 1115  Temp 36.8 C 12/16/22 1115  Pulse 88 12/16/22 1119  Resp 21 12/16/22 1119  SpO2 100 % 12/16/22 1119  Vitals shown include unfiled device data.  Last Pain:  Vitals:   12/16/22 1115  TempSrc:   PainSc: Asleep         Complications: No notable events documented.

## 2022-12-17 ENCOUNTER — Other Ambulatory Visit: Payer: Self-pay

## 2022-12-17 ENCOUNTER — Other Ambulatory Visit (HOSPITAL_COMMUNITY): Payer: Self-pay

## 2022-12-17 ENCOUNTER — Encounter (HOSPITAL_COMMUNITY): Payer: Self-pay | Admitting: Obstetrics and Gynecology

## 2022-12-17 LAB — BASIC METABOLIC PANEL
Anion gap: 8 (ref 5–15)
BUN: 6 mg/dL (ref 6–20)
CO2: 24 mmol/L (ref 22–32)
Calcium: 8.7 mg/dL — ABNORMAL LOW (ref 8.9–10.3)
Chloride: 103 mmol/L (ref 98–111)
Creatinine, Ser: 1.06 mg/dL — ABNORMAL HIGH (ref 0.44–1.00)
GFR, Estimated: >60 mL/min (ref >60)
Glucose, Bld: 114 mg/dL — ABNORMAL HIGH (ref 70–99)
Potassium: 3.3 mmol/L — ABNORMAL LOW (ref 3.5–5.1)
Sodium: 135 mmol/L (ref 135–145)

## 2022-12-17 LAB — CBC
HCT: 29.2 % — ABNORMAL LOW (ref 36.0–46.0)
Hemoglobin: 9.7 g/dL — ABNORMAL LOW (ref 12.0–15.0)
MCH: 29.8 pg (ref 26.0–34.0)
MCHC: 33.2 g/dL (ref 30.0–36.0)
MCV: 89.6 fL (ref 80.0–100.0)
Platelets: 376 10*3/uL (ref 150–400)
RBC: 3.26 MIL/uL — ABNORMAL LOW (ref 3.87–5.11)
RDW: 12.8 % (ref 11.5–15.5)
WBC: 9.9 10*3/uL (ref 4.0–10.5)
nRBC: 0 % (ref 0.0–0.2)

## 2022-12-17 MED ORDER — SODIUM CHLORIDE 0.9 % IV BOLUS
1000.0000 mL | Freq: Once | INTRAVENOUS | Status: AC
  Administered 2022-12-17: 1000 mL via INTRAVENOUS

## 2022-12-17 MED ORDER — PROMETHAZINE HCL 25 MG PO TABS: 25.0000 mg | ORAL_TABLET | Freq: Four times a day (QID) | ORAL | Status: DC | PRN

## 2022-12-17 MED ORDER — FAMOTIDINE IN NACL 20-0.9 MG/50ML-% IV SOLN
20.0000 mg | Freq: Once | INTRAVENOUS | Status: AC
  Administered 2022-12-17: 20 mg via INTRAVENOUS
  Filled 2022-12-17: qty 50

## 2022-12-17 MED ORDER — HYDROMORPHONE HCL 1 MG/ML IJ SOLN
2.0000 mg | INTRAMUSCULAR | Status: DC | PRN
  Administered 2022-12-17 – 2022-12-18 (×2): 2 mg via INTRAVENOUS
  Filled 2022-12-17 (×2): qty 2

## 2022-12-17 MED ORDER — IBUPROFEN 800 MG PO TABS
800.0000 mg | ORAL_TABLET | Freq: Three times a day (TID) | ORAL | 0 refills | Status: DC
  Filled 2022-12-17: qty 30, 10d supply, fill #0

## 2022-12-17 MED ORDER — PROMETHAZINE HCL 25 MG PO TABS
25.0000 mg | ORAL_TABLET | Freq: Four times a day (QID) | ORAL | 0 refills | Status: DC | PRN
  Filled 2022-12-17: qty 15, 4d supply, fill #0

## 2022-12-17 MED ORDER — OXYCODONE-ACETAMINOPHEN 5-325 MG PO TABS
1.0000 | ORAL_TABLET | ORAL | 0 refills | Status: DC | PRN
Start: 2022-12-17 — End: 2022-12-18
  Filled 2022-12-17: qty 30, 5d supply, fill #0

## 2022-12-17 NOTE — Progress Notes (Signed)
Pt medicated with percocet this pm but began feeling nauseated and complained of abdominal pain. Prn nausea and iv pain medicine administered for excruciating pain. Called MD office to update and left a message with the answering service.

## 2022-12-17 NOTE — Anesthesia Postprocedure Evaluation (Signed)
Anesthesia Post Note  Patient: Holly Brown  Procedure(s) Performed: TOTAL VAGINAL HYSTERECTOMY, BILATERAL SALPINGECTOMY (Vagina )     Patient location during evaluation: PACU Anesthesia Type: General Level of consciousness: awake and alert Pain management: pain level controlled Vital Signs Assessment: post-procedure vital signs reviewed and stable Respiratory status: spontaneous breathing, nonlabored ventilation, respiratory function stable and patient connected to nasal cannula oxygen Cardiovascular status: blood pressure returned to baseline and stable Postop Assessment: no apparent nausea or vomiting Anesthetic complications: no   No notable events documented.  Last Vitals:  Vitals:   12/17/22 0541 12/17/22 0855  BP: 138/75 129/69  Pulse: 99 64  Resp: 19 17  Temp: 36.8 C 36.8 C  SpO2: 100% 100%    Last Pain:  Vitals:   12/17/22 0855  TempSrc: Oral  PainSc:                  Collene Schlichter

## 2022-12-17 NOTE — Discharge Summary (Signed)
Physician Discharge Summary  Patient ID: Holly Brown MRN: [id number] DOB/AGE: 35-Jan-1987 35 y.o.  Admit date: 12/16/2022 Discharge date: 12/18/22  Admission Diagnoses: Uterine Fibroids  Discharge Diagnoses:  Principal Problem:   Post-operative state   Discharged Condition: good  Hospital Course: Holly Brown was admitted for above Dx. See admit H & P for additional information. She underwent TVH on 12/16/22 without problems. See OP note for additional information. Pt's post op course was unremarkable. She did have some problems with post op N/V but this improved with mediations. She otherwise progressed to ambulating, voiding, tolerating diet and good oral pain control. Felt amendable for discharge home. Discharge instructions, medications and follow up reviewed with pt. Pt verbalized understanding.  Consults: None  Significant Diagnostic Studies: labs  Treatments: surgery: TVH  Discharge Exam: Blood pressure 114/62, pulse 97, temperature 98.8 F (37.1 C), temperature source Oral, resp. rate 17, height 5\' 10"  (1.778 m), weight 108.9 kg, SpO2 100%. Lungs clear Heart RRR Abd soft + BS  GU no bleeding Ext non tender  Disposition: Discharge disposition: 01-Home or Self Care       Discharge Instructions     Call MD for:  difficulty breathing, headache or visual disturbances   Complete by: As directed    Call MD for:  difficulty breathing, headache or visual disturbances   Complete by: As directed    Call MD for:  extreme fatigue   Complete by: As directed    Call MD for:  extreme fatigue   Complete by: As directed    Call MD for:  hives   Complete by: As directed    Call MD for:  hives   Complete by: As directed    Call MD for:  persistant dizziness or light-headedness   Complete by: As directed    Call MD for:  persistant dizziness or light-headedness   Complete by: As directed    Call MD for:  persistant nausea and vomiting   Complete by: As directed    Call  MD for:  persistant nausea and vomiting   Complete by: As directed    Call MD for:  redness, tenderness, or signs of infection (pain, swelling, redness, odor or green/yellow discharge around incision site)   Complete by: As directed    Call MD for:  redness, tenderness, or signs of infection (pain, swelling, redness, odor or green/yellow discharge around incision site)   Complete by: As directed    Call MD for:  severe uncontrolled pain   Complete by: As directed    Call MD for:  severe uncontrolled pain   Complete by: As directed    Call MD for:  temperature >100.4   Complete by: As directed    Call MD for:  temperature >100.4   Complete by: As directed    Diet - low sodium heart healthy   Complete by: As directed    Increase activity slowly   Complete by: As directed    Increase activity slowly   Complete by: As directed    Sexual Activity Restrictions   Complete by: As directed    Pelvic rest x 4 weeks   Sexual Activity Restrictions   Complete by: As directed    Pelvic rest x 4 weeks      Allergies as of 12/18/2022   No Known Allergies      Medication List     STOP taking these medications    medroxyPROGESTERone Acetate 150 MG/ML Susy   MULTIVITAMIN GUMMIES  ADULT PO       TAKE these medications    HYDROmorphone 2 MG tablet Commonly known as: DILAUDID Take 1 tablet (2 mg total) by mouth every 4 (four) hours as needed for severe pain.   ibuprofen 800 MG tablet Commonly known as: ADVIL Take 1 tablet (800 mg total) by mouth 3 (three) times daily.   promethazine 25 MG tablet Commonly known as: PHENERGAN Take 1 tablet (25 mg total) by mouth every 6 (six) hours as needed for nausea or vomiting.   senna 8.6 MG Tabs tablet Commonly known as: SENOKOT Take 1 tablet (8.6 mg total) by mouth 2 (two) times daily.   simethicone 80 MG chewable tablet Commonly known as: MYLICON Chew 1 tablet (80 mg total) by mouth 4 (four) times daily as needed for flatulence.         Follow-up Information     [facility] Follow up.   Contact information: [address] [place] [address] [phone]                Signed: Hermina Staggers 12/18/2022, 7:11 AM

## 2022-12-17 NOTE — Progress Notes (Signed)
Pt s labs now resulted.

## 2022-12-17 NOTE — Plan of Care (Signed)
  Problem: Education: Goal: Knowledge of General Education information will improve Description: Including pain rating scale, medication(s)/side effects and non-pharmacologic comfort measures Outcome: Progressing   Problem: Health Behavior/Discharge Planning: Goal: Ability to manage health-related needs will improve Outcome: Progressing   Problem: Clinical Measurements: Goal: Ability to maintain clinical measurements within normal limits will improve Outcome: Progressing Goal: Will remain free from infection Outcome: Progressing Goal: Diagnostic test results will improve Outcome: Progressing Goal: Respiratory complications will improve Outcome: Progressing Goal: Cardiovascular complication will be avoided Outcome: Progressing   Problem: Activity: Goal: Risk for activity intolerance will decrease Outcome: Progressing   Problem: Nutrition: Goal: Adequate nutrition will be maintained Outcome: Progressing   Problem: Coping: Goal: Level of anxiety will decrease Outcome: Progressing   Problem: Elimination: Goal: Will not experience complications related to bowel motility Outcome: Progressing Goal: Will not experience complications related to urinary retention Outcome: Progressing   Problem: Pain Managment: Goal: General experience of comfort will improve Outcome: Progressing   Problem: Safety: Goal: Ability to remain free from injury will improve Outcome: Progressing   Problem: Skin Integrity: Goal: Risk for impaired skin integrity will decrease Outcome: Progressing   Problem: Education: Goal: Knowledge of the prescribed therapeutic regimen will improve Outcome: Progressing Goal: Understanding of sexual limitations or changes related to disease process or condition will improve Outcome: Progressing Goal: Individualized Educational Video(s) Outcome: Progressing   Problem: Self-Concept: Goal: Communication of feelings regarding changes in body function or  appearance will improve Outcome: Progressing   Problem: Skin Integrity: Goal: Demonstration of wound healing without infection will improve Outcome: Progressing   

## 2022-12-18 ENCOUNTER — Other Ambulatory Visit (HOSPITAL_COMMUNITY): Payer: Self-pay

## 2022-12-18 MED ORDER — IBUPROFEN 800 MG PO TABS: 800.0000 mg | ORAL_TABLET | Freq: Three times a day (TID) | ORAL | 0 refills | Status: DC

## 2022-12-18 MED ORDER — HYDROMORPHONE HCL 2 MG PO TABS: 2.0000 mg | ORAL_TABLET | ORAL | 0 refills | Status: DC | PRN

## 2022-12-18 MED ORDER — PROMETHAZINE HCL 25 MG PO TABS: 25.0000 mg | ORAL_TABLET | Freq: Four times a day (QID) | ORAL | 0 refills | Status: DC | PRN

## 2022-12-18 MED ORDER — BISACODYL 10 MG RE SUPP
10.0000 mg | Freq: Once | RECTAL | Status: AC
  Administered 2022-12-18: 10 mg via RECTAL
  Filled 2022-12-18: qty 1

## 2022-12-18 MED ORDER — SENNA 8.6 MG PO TABS
1.0000 | ORAL_TABLET | Freq: Two times a day (BID) | ORAL | 0 refills | Status: DC
  Filled 2022-12-18: qty 20, 10d supply, fill #0

## 2022-12-18 MED ORDER — SIMETHICONE 80 MG PO CHEW
80.0000 mg | CHEWABLE_TABLET | Freq: Four times a day (QID) | ORAL | 0 refills | Status: DC | PRN
  Filled 2022-12-18: qty 100, 25d supply, fill #0

## 2022-12-18 MED ORDER — PROMETHAZINE HCL 25 MG PO TABS
25.0000 mg | ORAL_TABLET | Freq: Four times a day (QID) | ORAL | 0 refills | Status: DC | PRN
  Filled 2022-12-18: qty 15, 4d supply, fill #0

## 2022-12-18 MED ORDER — HYDROMORPHONE HCL 2 MG PO TABS
2.0000 mg | ORAL_TABLET | ORAL | 0 refills | Status: DC | PRN
Start: 2022-12-18 — End: 2023-01-07
  Filled 2022-12-18 (×2): qty 30, 5d supply, fill #0

## 2022-12-18 MED ORDER — IBUPROFEN 800 MG PO TABS
800.0000 mg | ORAL_TABLET | Freq: Three times a day (TID) | ORAL | 0 refills | Status: DC
  Filled 2022-12-18: qty 30, 10d supply, fill #0

## 2022-12-18 MED ORDER — SIMETHICONE 80 MG PO CHEW
80.0000 mg | CHEWABLE_TABLET | Freq: Four times a day (QID) | ORAL | 0 refills | Status: DC | PRN
  Filled 2022-12-18: qty 30, 8d supply, fill #0

## 2022-12-18 NOTE — Progress Notes (Signed)
   12/18/22 1010  TOC Brief Assessment  Insurance and Status Reviewed  Patient has primary care physician Yes  Home environment has been reviewed yes  Prior level of function: independent  Prior/Current Home Services No current home services  Social Determinants of Health Reivew SDOH reviewed no interventions necessary  Readmission risk has been reviewed Yes  Transition of care needs no transition of care needs at this time

## 2022-12-18 NOTE — Progress Notes (Addendum)
Patient left for home with, medication and AVS discussed patient declined hard copied prescription states she had sufficient pain med that was delivered from [facility]. Pt left accompanied by her mother.

## 2022-12-18 NOTE — Progress Notes (Signed)
Patient called for pain meds, stating it is gas trap, unable to release it after trying everything ( walking/ simethicone.) at that poit giving her contraction pain. Patient only got prn IV dilaudid. However, patient will be discharge today, on call Oby made aware. Dr. Nettie Elm was rounding, evaluated patient. Stated will place new orders . Oncoming RN made aware.

## 2022-12-19 ENCOUNTER — Other Ambulatory Visit: Payer: Self-pay

## 2022-12-19 ENCOUNTER — Inpatient Hospital Stay (HOSPITAL_COMMUNITY)
Admission: EM | Admit: 2022-12-19 | Discharge: 2022-12-22 | DRG: 920 | Disposition: A | Discharge status: Home / Self Care | Payer: 59 | Attending: Obstetrics and Gynecology | Admitting: Obstetrics and Gynecology

## 2022-12-19 ENCOUNTER — Encounter (HOSPITAL_COMMUNITY): Payer: Self-pay | Admitting: *Deleted

## 2022-12-19 ENCOUNTER — Emergency Department (HOSPITAL_COMMUNITY): Payer: 59

## 2022-12-19 DIAGNOSIS — Z9889 Other specified postprocedural states: Principal | ICD-10-CM

## 2022-12-19 DIAGNOSIS — G8918 Other acute postprocedural pain: Secondary | ICD-10-CM | POA: Diagnosis present

## 2022-12-19 DIAGNOSIS — Z90710 Acquired absence of both cervix and uterus: Secondary | ICD-10-CM | POA: Insufficient documentation

## 2022-12-19 DIAGNOSIS — Z87891 Personal history of nicotine dependence: Secondary | ICD-10-CM

## 2022-12-19 DIAGNOSIS — N99840 Postprocedural hematoma of a genitourinary system organ or structure following a genitourinary system procedure: Secondary | ICD-10-CM | POA: Diagnosis not present

## 2022-12-19 DIAGNOSIS — R1084 Generalized abdominal pain: Secondary | ICD-10-CM | POA: Diagnosis not present

## 2022-12-19 DIAGNOSIS — N179 Acute kidney failure, unspecified: Secondary | ICD-10-CM | POA: Insufficient documentation

## 2022-12-19 DIAGNOSIS — R112 Nausea with vomiting, unspecified: Secondary | ICD-10-CM

## 2022-12-19 DIAGNOSIS — R109 Unspecified abdominal pain: Secondary | ICD-10-CM | POA: Diagnosis not present

## 2022-12-19 DIAGNOSIS — E876 Hypokalemia: Secondary | ICD-10-CM | POA: Diagnosis present

## 2022-12-19 DIAGNOSIS — R1110 Vomiting, unspecified: Principal | ICD-10-CM

## 2022-12-19 HISTORY — DX: Other specified postprocedural states: R11.2

## 2022-12-19 LAB — COMPREHENSIVE METABOLIC PANEL
ALT: 23 U/L (ref 0–44)
AST: 27 U/L (ref 15–41)
Albumin: 3.9 g/dL (ref 3.5–5.0)
Alkaline Phosphatase: 53 U/L (ref 38–126)
Anion gap: 16 — ABNORMAL HIGH (ref 5–15)
BUN: <5 mg/dL — ABNORMAL LOW (ref 6–20)
CO2: 17 mmol/L — ABNORMAL LOW (ref 22–32)
Calcium: 9.2 mg/dL (ref 8.9–10.3)
Chloride: 103 mmol/L (ref 98–111)
Creatinine, Ser: 1.13 mg/dL — ABNORMAL HIGH (ref 0.44–1.00)
GFR, Estimated: >60 mL/min (ref >60)
Glucose, Bld: 161 mg/dL — ABNORMAL HIGH (ref 70–99)
Potassium: 3 mmol/L — ABNORMAL LOW (ref 3.5–5.1)
Sodium: 136 mmol/L (ref 135–145)
Total Bilirubin: 2 mg/dL — ABNORMAL HIGH (ref 0.3–1.2)
Total Protein: 7.3 g/dL (ref 6.5–8.1)

## 2022-12-19 LAB — CBC
HCT: 32.2 % — ABNORMAL LOW (ref 36.0–46.0)
Hemoglobin: 10.6 g/dL — ABNORMAL LOW (ref 12.0–15.0)
MCH: 30.3 pg (ref 26.0–34.0)
MCHC: 32.9 g/dL (ref 30.0–36.0)
MCV: 92 fL (ref 80.0–100.0)
Platelets: 446 10*3/uL — ABNORMAL HIGH (ref 150–400)
RBC: 3.5 MIL/uL — ABNORMAL LOW (ref 3.87–5.11)
RDW: 12.5 % (ref 11.5–15.5)
WBC: 13.4 10*3/uL — ABNORMAL HIGH (ref 4.0–10.5)
nRBC: 0 % (ref 0.0–0.2)

## 2022-12-19 LAB — URINALYSIS, ROUTINE W REFLEX MICROSCOPIC
Bilirubin Urine: NEGATIVE
Glucose, UA: 150 mg/dL — AB
Ketones, ur: 20 mg/dL — AB
Leukocytes,Ua: NEGATIVE
Nitrite: NEGATIVE
Protein, ur: NEGATIVE mg/dL
Specific Gravity, Urine: 1.023 (ref 1.005–1.030)
pH: 9 — ABNORMAL HIGH (ref 5.0–8.0)

## 2022-12-19 LAB — HCG, SERUM, QUALITATIVE: Preg, Serum: NEGATIVE

## 2022-12-19 LAB — LIPASE, BLOOD: Lipase: 19 U/L (ref 11–51)

## 2022-12-19 MED ORDER — MORPHINE SULFATE (PF) 4 MG/ML IV SOLN
4.0000 mg | Freq: Once | INTRAVENOUS | Status: AC
  Administered 2022-12-19: 4 mg via INTRAVENOUS
  Filled 2022-12-19: qty 1

## 2022-12-19 MED ORDER — ONDANSETRON HCL 4 MG/2ML IJ SOLN
4.0000 mg | Freq: Once | INTRAMUSCULAR | Status: AC
  Administered 2022-12-19: 4 mg via INTRAVENOUS
  Filled 2022-12-19: qty 2

## 2022-12-19 MED ORDER — HYDROMORPHONE HCL 1 MG/ML IJ SOLN
1.0000 mg | Freq: Once | INTRAMUSCULAR | Status: AC
  Administered 2022-12-19: 1 mg via INTRAVENOUS
  Filled 2022-12-19: qty 1

## 2022-12-19 MED ORDER — POTASSIUM CHLORIDE CRYS ER 20 MEQ PO TBCR
40.0000 meq | EXTENDED_RELEASE_TABLET | Freq: Once | ORAL | Status: DC
  Filled 2022-12-19: qty 2

## 2022-12-19 MED ORDER — IOHEXOL 350 MG/ML SOLN
75.0000 mL | Freq: Once | INTRAVENOUS | Status: AC | PRN
  Administered 2022-12-19: 75 mL via INTRAVENOUS

## 2022-12-19 MED ORDER — SODIUM CHLORIDE 0.9 % IV SOLN
12.5000 mg | Freq: Four times a day (QID) | INTRAVENOUS | Status: DC | PRN
  Administered 2022-12-21: 12.5 mg via INTRAVENOUS
  Filled 2022-12-19: qty 0.5
  Filled 2022-12-19: qty 12.5

## 2022-12-19 MED ORDER — POTASSIUM CHLORIDE 10 MEQ/100ML IV SOLN
10.0000 meq | INTRAVENOUS | Status: AC
  Administered 2022-12-19 – 2022-12-20 (×2): 10 meq via INTRAVENOUS
  Filled 2022-12-19 (×2): qty 100

## 2022-12-19 MED ORDER — LACTATED RINGERS IV BOLUS
1000.0000 mL | Freq: Once | INTRAVENOUS | Status: AC
  Administered 2022-12-19: 1000 mL via INTRAVENOUS

## 2022-12-19 MED ORDER — FENTANYL CITRATE PF 50 MCG/ML IJ SOSY
50.0000 ug | PREFILLED_SYRINGE | Freq: Once | INTRAMUSCULAR | Status: AC
  Administered 2022-12-19: 50 ug via INTRAVENOUS
  Filled 2022-12-19: qty 1

## 2022-12-19 NOTE — ED Triage Notes (Signed)
The pt had a hysterectomy Tuesday vaginal hysterectomy  no bm since Sunday.  She has had abd pain  with n and vomiting for 2 days  no vaginal bleeding

## 2022-12-19 NOTE — ED Provider Notes (Signed)
  [place] EMERGENCY DEPARTMENT AT [facility] Provider Note   CSN: [id number] Arrival date & time: 12/19/22  1809     History {Add pertinent medical, surgical, social history, OB history to HPI:1} Chief Complaint  Patient presents with   Emesis    Holly Brown is a 37 y.o. female.   Emesis      Home Medications Prior to Admission medications   Medication Sig Start Date End Date Taking? Authorizing Provider  HYDROmorphone (DILAUDID) 2 MG tablet Take 1 tablet (2 mg total) by mouth every 4 (four) hours as needed for severe pain. 12/18/22   Hermina Staggers, MD  HYDROmorphone (DILAUDID) 2 MG tablet Take 1 tablet (2 mg total) by mouth every 4 (four) hours as needed for severe pain. 12/18/22   Hermina Staggers, MD  ibuprofen (ADVIL) 800 MG tablet Take 1 tablet (800 mg total) by mouth 3 (three) times daily. 12/18/22   Hermina Staggers, MD  promethazine (PHENERGAN) 25 MG tablet Take 1 tablet (25 mg total) by mouth every 6 (six) hours as needed for nausea or vomiting. 12/18/22   Hermina Staggers, MD  senna (SENOKOT) 8.6 MG TABS tablet Take 1 tablet (8.6 mg total) by mouth 2 (two) times daily. 12/18/22   Hermina Staggers, MD  simethicone (MYLICON) 80 MG chewable tablet Chew 1 tablet (80 mg total) by mouth 4 (four) times daily as needed for flatulence. 12/18/22   Hermina Staggers, MD      Allergies    Patient has no known allergies.    Review of Systems   Review of Systems  Gastrointestinal:  Positive for vomiting.    Physical Exam Updated Vital Signs BP (!) 152/128 (BP Location: Right Arm)   Pulse 95   Temp 98.6 F (37 C)   Resp (!) 22   Ht 5\' 10"  (1.778 m)   Wt 108.9 kg   LMP  (LMP Unknown) Comment: Depo Injections - last injection 10/2022, had spotting on and off since 10/2022 injection  SpO2 100%   BMI 34.45 kg/m  Physical Exam  ED Results / Procedures / Treatments   Labs (all labs ordered are listed, but only abnormal results are displayed) Labs Reviewed   LIPASE, BLOOD  COMPREHENSIVE METABOLIC PANEL  CBC  URINALYSIS, ROUTINE W REFLEX MICROSCOPIC  HCG, SERUM, QUALITATIVE    EKG None  Radiology No results found.  Procedures Procedures  {Document cardiac monitor, telemetry assessment procedure when appropriate:1}  Medications Ordered in ED Medications - No data to display  ED Course/ Medical Decision Making/ A&P   {   Click here for ABCD2, HEART and other calculatorsREFRESH Note before signing :1}                              Medical Decision Making  ***  {Document critical care time when appropriate:1} {Document review of labs and clinical decision tools ie heart score, Chads2Vasc2 etc:1}  {Document your independent review of radiology images, and any outside records:1} {Document your discussion with family members, caretakers, and with consultants:1} {Document social determinants of health affecting pt's care:1} {Document your decision making why or why not admission, treatments were needed:1} Final Clinical Impression(s) / ED Diagnoses Final diagnoses:  None    Rx / DC Orders ED Discharge Orders     None

## 2022-12-20 DIAGNOSIS — R109 Unspecified abdominal pain: Secondary | ICD-10-CM | POA: Diagnosis present

## 2022-12-20 DIAGNOSIS — E876 Hypokalemia: Secondary | ICD-10-CM | POA: Diagnosis not present

## 2022-12-20 DIAGNOSIS — R112 Nausea with vomiting, unspecified: Secondary | ICD-10-CM

## 2022-12-20 DIAGNOSIS — R1012 Left upper quadrant pain: Secondary | ICD-10-CM | POA: Diagnosis not present

## 2022-12-20 DIAGNOSIS — R102 Pelvic and perineal pain: Secondary | ICD-10-CM | POA: Diagnosis not present

## 2022-12-20 DIAGNOSIS — N99840 Postprocedural hematoma of a genitourinary system organ or structure following a genitourinary system procedure: Secondary | ICD-10-CM | POA: Diagnosis not present

## 2022-12-20 DIAGNOSIS — N179 Acute kidney failure, unspecified: Secondary | ICD-10-CM | POA: Diagnosis not present

## 2022-12-20 DIAGNOSIS — N3000 Acute cystitis without hematuria: Secondary | ICD-10-CM | POA: Diagnosis not present

## 2022-12-20 DIAGNOSIS — N99820 Postprocedural hemorrhage and hematoma of a genitourinary system organ or structure following a genitourinary system procedure: Secondary | ICD-10-CM | POA: Diagnosis not present

## 2022-12-20 DIAGNOSIS — Z9889 Other specified postprocedural states: Secondary | ICD-10-CM | POA: Diagnosis not present

## 2022-12-20 DIAGNOSIS — Z87891 Personal history of nicotine dependence: Secondary | ICD-10-CM | POA: Diagnosis not present

## 2022-12-20 DIAGNOSIS — N9489 Other specified conditions associated with female genital organs and menstrual cycle: Secondary | ICD-10-CM | POA: Diagnosis not present

## 2022-12-20 DIAGNOSIS — C539 Malignant neoplasm of cervix uteri, unspecified: Secondary | ICD-10-CM | POA: Diagnosis not present

## 2022-12-20 DIAGNOSIS — K269 Duodenal ulcer, unspecified as acute or chronic, without hemorrhage or perforation: Secondary | ICD-10-CM | POA: Diagnosis not present

## 2022-12-20 DIAGNOSIS — G8918 Other acute postprocedural pain: Secondary | ICD-10-CM | POA: Diagnosis not present

## 2022-12-20 LAB — COMPREHENSIVE METABOLIC PANEL
ALT: 30 U/L (ref 0–44)
AST: 30 U/L (ref 15–41)
Albumin: 3.5 g/dL (ref 3.5–5.0)
Alkaline Phosphatase: 50 U/L (ref 38–126)
Anion gap: 15 (ref 5–15)
BUN: <5 mg/dL — ABNORMAL LOW (ref 6–20)
CO2: 19 mmol/L — ABNORMAL LOW (ref 22–32)
Calcium: 9.3 mg/dL (ref 8.9–10.3)
Chloride: 102 mmol/L (ref 98–111)
Creatinine, Ser: 0.99 mg/dL (ref 0.44–1.00)
GFR, Estimated: >60 mL/min (ref >60)
Glucose, Bld: 141 mg/dL — ABNORMAL HIGH (ref 70–99)
Potassium: 3.2 mmol/L — ABNORMAL LOW (ref 3.5–5.1)
Sodium: 136 mmol/L (ref 135–145)
Total Bilirubin: 1.7 mg/dL — ABNORMAL HIGH (ref 0.3–1.2)
Total Protein: 6.8 g/dL (ref 6.5–8.1)

## 2022-12-20 LAB — CBC
HCT: 29.1 % — ABNORMAL LOW (ref 36.0–46.0)
Hemoglobin: 9.8 g/dL — ABNORMAL LOW (ref 12.0–15.0)
MCH: 30 pg (ref 26.0–34.0)
MCHC: 33.7 g/dL (ref 30.0–36.0)
MCV: 89 fL (ref 80.0–100.0)
Platelets: 407 10*3/uL — ABNORMAL HIGH (ref 150–400)
RBC: 3.27 MIL/uL — ABNORMAL LOW (ref 3.87–5.11)
RDW: 12.7 % (ref 11.5–15.5)
WBC: 13.6 10*3/uL — ABNORMAL HIGH (ref 4.0–10.5)
nRBC: 0 % (ref 0.0–0.2)

## 2022-12-20 MED ORDER — METOCLOPRAMIDE HCL 5 MG/ML IJ SOLN
10.0000 mg | Freq: Four times a day (QID) | INTRAMUSCULAR | Status: DC | PRN
  Administered 2022-12-20 – 2022-12-21 (×2): 10 mg via INTRAVENOUS
  Filled 2022-12-20 (×2): qty 2

## 2022-12-20 MED ORDER — POTASSIUM CHLORIDE 10 MEQ/100ML IV SOLN
INTRAVENOUS | Status: AC
  Filled 2022-12-20: qty 100

## 2022-12-20 MED ORDER — ALUM & MAG HYDROXIDE-SIMETH 200-200-20 MG/5ML PO SUSP
30.0000 mL | ORAL | Status: DC | PRN
  Administered 2022-12-20: 30 mL via ORAL
  Filled 2022-12-20: qty 30

## 2022-12-20 MED ORDER — SIMETHICONE 80 MG PO CHEW: 80.0000 mg | CHEWABLE_TABLET | Freq: Four times a day (QID) | ORAL | Status: DC | PRN

## 2022-12-20 MED ORDER — KCL-LACTATED RINGERS-D5W 20 MEQ/L IV SOLN
INTRAVENOUS | Status: DC
  Filled 2022-12-20 (×7): qty 1000

## 2022-12-20 MED ORDER — ONDANSETRON HCL 4 MG/2ML IJ SOLN
4.0000 mg | Freq: Four times a day (QID) | INTRAMUSCULAR | Status: DC | PRN
  Administered 2022-12-21: 4 mg via INTRAVENOUS
  Filled 2022-12-20: qty 2

## 2022-12-20 MED ORDER — POLYETHYLENE GLYCOL 3350 17 G PO PACK: 17.0000 g | PACK | Freq: Every day | ORAL | Status: DC | PRN

## 2022-12-20 MED ORDER — SENNA 8.6 MG PO TABS
1.0000 | ORAL_TABLET | Freq: Two times a day (BID) | ORAL | Status: DC
  Administered 2022-12-20 – 2022-12-22 (×3): 8.6 mg via ORAL
  Filled 2022-12-20 (×3): qty 1

## 2022-12-20 MED ORDER — ACETAMINOPHEN 10 MG/ML IV SOLN
1000.0000 mg | Freq: Four times a day (QID) | INTRAVENOUS | Status: AC
  Administered 2022-12-20 – 2022-12-21 (×3): 1000 mg via INTRAVENOUS
  Filled 2022-12-20 (×5): qty 100

## 2022-12-20 MED ORDER — PRENATAL MULTIVITAMIN CH: 1.0000 | ORAL_TABLET | Freq: Every day | ORAL | Status: DC

## 2022-12-20 MED ORDER — KETOROLAC TROMETHAMINE 30 MG/ML IJ SOLN
30.0000 mg | Freq: Four times a day (QID) | INTRAMUSCULAR | Status: AC
  Administered 2022-12-20 – 2022-12-21 (×2): 30 mg via INTRAVENOUS
  Filled 2022-12-20 (×3): qty 1

## 2022-12-20 MED ORDER — HYDROMORPHONE HCL 1 MG/ML IJ SOLN
0.2000 mg | INTRAMUSCULAR | Status: DC | PRN
  Administered 2022-12-20 (×2): 0.5 mg via INTRAVENOUS
  Administered 2022-12-21 (×4): 0.6 mg via INTRAVENOUS
  Filled 2022-12-20 (×6): qty 1

## 2022-12-20 NOTE — H&P (Signed)
Faculty Practice Obstetrics and Gynecology Attending History and Physical  GENELLA SCHOUTEN is a 37 y.o. [id number] who presented to [facility] ED today for evaluation of persistent abdominal pain, nausea and vomiting. Workup with 9.9cm pelvic hematoma/post op blood products and e/o mild AKI/dehydration. Pt is being admitted for observation for pain control and & antiemetics.   Pt reports issues with post op nausea/vomiting on POD0-1. Her symptoms were controlled with PO medications and she was able to be discharged POD1. She reports that after she got home she was able to tolerate a cup of noodles, but then started to have persistent nausea & vomiting. Was not able to keep down her antiemetics or pain medications. Does not think the pain medicine was worsening her nausea/vomiting. Her pain then became so severe she presented to the ED. Is passing flatus. Reports constipation but had a bowel movement since being in the ED. Is feeling better  Denies any abnormal vaginal discharge, vaginal bleeding, fevers, chills, sweats, or issues with bladder function.   In the ED, the patient has been afebrile, HDS. She has required IV fentanyl , IV dilaudid 1mg , IV morphine 4mg  x2 for control of her pain. She has received 1L IVF and IV zofran 4mg  x1.   Labs notable for possible mild AKI w/ Cr 1.13 (baseline appears to be 0.89-1.1, was 1.06 on day of discharge), mild hypokalemia w/ K 3.0, and UA w/ +ketones. Her Hgb was stable at 10.6 from 9.7 on day of discharge. She has a mild leukocytosis w/ WBC 13.4. Remainder of CMP and lipase were unremarkable.  CT A/P with 9.9 x 6.2cm blood products/hematoma in the pelvis, but otherwise normal exam w/ no e/o urinary tract obstruction, bowel obstruction or ileus.   Past Medical History:  Diagnosis Date   Gestational diabetes    metformin - resolved after delivery   History of UTI    HSV (herpes simplex virus) infection    Migraines    Peripheral vascular disease (HCC)     Post-operative nausea and vomiting 12/19/2022   Seizures (HCC)    Jan.2016 - only had one seizure, none since   Vaginal Pap smear, abnormal    Past Surgical History:  Procedure Laterality Date   DILATION AND CURETTAGE OF UTERUS     LAPAROSCOPIC TUBAL LIGATION Bilateral 12/08/2018   Procedure: LAPAROSCOPIC TUBAL LIGATION;  Surgeon: Catalina Antigua, MD;  Location: [facility];  Service: Gynecology;  Laterality: Bilateral;   LEEP N/A 03/18/2022   Procedure: LOOP ELECTROSURGICAL EXCISION PROCEDURE (LEEP);  Surgeon: Catalina Antigua, MD;  Location: [facility] OR;  Service: Gynecology;  Laterality: N/A;   VAGINAL HYSTERECTOMY N/A 12/16/2022   Procedure: TOTAL VAGINAL HYSTERECTOMY, BILATERAL SALPINGECTOMY;  Surgeon: Hermina Staggers, MD;  Location: [facility] OR;  Service: Gynecology;  Laterality: N/A;   WISDOM TOOTH EXTRACTION  04/21/2009   OB History  Gravida Para Term Preterm AB Living  5 4 3 1 1 4   SAB IAB Ectopic Multiple Live Births  1     0 4    # Outcome Date GA Lbr Len/2nd Weight Sex Type Anes PTL Lv  5 Term 08/31/18 [redacted]w[redacted]d 03:39 3164 g F Vag-Spont None  LIV  4 Term 11/08/12 [redacted]w[redacted]d  3532 g M Vag-Spont None  LIV     Birth Comments: No complications during pregnancy or delivery.  No special care nursery, went home with mother after 2 days.  3 Preterm 08/04/09 [redacted]w[redacted]d  3345 g M Vag-Spont  Y LIV  2 Term  10/02/08    F Vag-Spont   LIV  1 SAB           Patient denies any other pertinent gynecologic issues.  No current facility-administered medications on file prior to encounter.   Current Outpatient Medications on File Prior to Encounter  Medication Sig Dispense Refill   HYDROmorphone (DILAUDID) 2 MG tablet Take 1 tablet (2 mg total) by mouth every 4 (four) hours as needed for severe pain. 30 tablet 0   HYDROmorphone (DILAUDID) 2 MG tablet Take 1 tablet (2 mg total) by mouth every 4 (four) hours as needed for severe pain. 30 tablet 0   ibuprofen (ADVIL) 800 MG tablet Take 1 tablet (800 mg  total) by mouth 3 (three) times daily. 30 tablet 0   promethazine (PHENERGAN) 25 MG tablet Take 1 tablet (25 mg total) by mouth every 6 (six) hours as needed for nausea or vomiting. 15 tablet 0   senna (SENOKOT) 8.6 MG TABS tablet Take 1 tablet (8.6 mg total) by mouth 2 (two) times daily. 20 tablet 0   simethicone (MYLICON) 80 MG chewable tablet Chew 1 tablet (80 mg total) by mouth 4 (four) times daily as needed for flatulence. 100 tablet 0   No Known Allergies  Social History:   reports that she has quit smoking. Her smoking use included cigarettes. She started smoking about 11 years ago. She has a 2.7 pack-year smoking history. She has never been exposed to tobacco smoke. She has never used smokeless tobacco. She reports current alcohol use. She reports that she does not currently use drugs after having used the following drugs: Marijuana. Family History  Problem Relation Age of Onset   Diabetes Mother    Stroke Mother    Colon cancer Father 9   Hypertension Maternal Grandmother    Diabetes Maternal Grandmother    Hypertension Paternal Grandmother     Review of Systems: Pertinent items noted in HPI and remainder of comprehensive ROS otherwise negative.  PHYSICAL EXAM: Blood pressure (!) 146/72, pulse 88, temperature 98.6 F (37 C), resp. rate 20, height 5\' 10"  (1.778 m), weight 108.9 kg, SpO2 100%. CONSTITUTIONAL: Well appearing. Alert & oriented.  CARDIOVASCULAR: Normal heart rate noted RESPIRATORY: Effort normal, no problems with respiration noted ABDOMEN: Soft, nontender, non-distended PELVIC: Deferred  Labs: Results for orders placed or performed during the hospital encounter of 12/19/22 (from the past 336 hour(s))  Lipase, blood   Collection Time: 12/19/22  6:26 PM  Result Value Ref Range   Lipase 19 11 - 51 U/L  Comprehensive metabolic panel   Collection Time: 12/19/22  6:26 PM  Result Value Ref Range   Sodium 136 135 - 145 mmol/L   Potassium 3.0 (L) 3.5 - 5.1 mmol/L    Chloride 103 98 - 111 mmol/L   CO2 17 (L) 22 - 32 mmol/L   Glucose, Bld 161 (H) 70 - 99 mg/dL   BUN <5 (L) 6 - 20 mg/dL   Creatinine, Ser 0.86 (H) 0.44 - 1.00 mg/dL   Calcium 9.2 8.9 - 57.8 mg/dL   Total Protein 7.3 6.5 - 8.1 g/dL   Albumin 3.9 3.5 - 5.0 g/dL   AST 27 15 - 41 U/L   ALT 23 0 - 44 U/L   Alkaline Phosphatase 53 38 - 126 U/L   Total Bilirubin 2.0 (H) 0.3 - 1.2 mg/dL   GFR, Estimated >46 >96 mL/min   Anion gap 16 (H) 5 - 15  CBC   Collection Time: 12/19/22  6:26 PM  Result Value Ref Range   WBC 13.4 (H) 4.0 - 10.5 K/uL   RBC 3.50 (L) 3.87 - 5.11 MIL/uL   Hemoglobin 10.6 (L) 12.0 - 15.0 g/dL   HCT 16.1 (L) 09.6 - 04.5 %   MCV 92.0 80.0 - 100.0 fL   MCH 30.3 26.0 - 34.0 pg   MCHC 32.9 30.0 - 36.0 g/dL   RDW 40.9 81.1 - 91.4 %   Platelets 446 (H) 150 - 400 K/uL   nRBC 0.0 0.0 - 0.2 %  hCG, serum, qualitative   Collection Time: 12/19/22  6:26 PM  Result Value Ref Range   Preg, Serum NEGATIVE NEGATIVE  Urinalysis, Routine w reflex microscopic -Urine, Clean Catch   Collection Time: 12/19/22  9:05 PM  Result Value Ref Range   Color, Urine YELLOW YELLOW   APPearance HAZY (A) CLEAR   Specific Gravity, Urine 1.023 1.005 - 1.030   pH 9.0 (H) 5.0 - 8.0   Glucose, UA 150 (A) NEGATIVE mg/dL   Hgb urine dipstick MODERATE (A) NEGATIVE   Bilirubin Urine NEGATIVE NEGATIVE   Ketones, ur 20 (A) NEGATIVE mg/dL   Protein, ur NEGATIVE NEGATIVE mg/dL   Nitrite NEGATIVE NEGATIVE   Leukocytes,Ua NEGATIVE NEGATIVE   RBC / HPF 0-5 0 - 5 RBC/hpf   WBC, UA 0-5 0 - 5 WBC/hpf   Bacteria, UA FEW (A) NONE SEEN   Squamous Epithelial / HPF 0-5 0 - 5 /HPF  Results for orders placed or performed during the hospital encounter of 12/16/22 (from the past 336 hour(s))  Pregnancy, urine POC   Collection Time: 12/16/22  7:44 AM  Result Value Ref Range   Preg Test, Ur NEGATIVE NEGATIVE  Surgical pcr screen   Collection Time: 12/16/22  9:25 AM   Specimen: Nasal Mucosa; Nasal Swab  Result  Value Ref Range   MRSA, PCR NEGATIVE NEGATIVE   Staphylococcus aureus NEGATIVE NEGATIVE   Imaging Studies: CT ABDOMEN PELVIS W CONTRAST  Result Date: 12/19/2022 CLINICAL DATA:  Postoperative abdominal pain, emesis, recent hysterectomy EXAM: CT ABDOMEN AND PELVIS WITH CONTRAST TECHNIQUE: Multidetector CT imaging of the abdomen and pelvis was performed using the standard protocol following bolus administration of intravenous contrast. RADIATION DOSE REDUCTION: This exam was performed according to the departmental dose-optimization program which includes automated exposure control, adjustment of the mA and/or kV according to patient size and/or use of iterative reconstruction technique. CONTRAST:  75mL OMNIPAQUE IOHEXOL 350 MG/ML SOLN COMPARISON:  09/23/2021 FINDINGS: Lower chest: No acute pleural or parenchymal lung disease. Hepatobiliary: No focal liver abnormality is seen. No gallstones, gallbladder wall thickening, or biliary dilatation. Pancreas: Unremarkable. No pancreatic ductal dilatation or surrounding inflammatory changes. Spleen: Normal in size without focal abnormality. Adrenals/Urinary Tract: The adrenals are unremarkable. Kidneys enhance normally and symmetrically. No urinary tract calculi or obstructive uropathy. Bladder is unremarkable. Stomach/Bowel: No bowel obstruction or ileus. No bowel wall thickening or inflammatory change. Normal appendix. Vascular/Lymphatic: No significant vascular findings are present. No enlarged abdominal or pelvic lymph nodes. Reproductive: Postsurgical changes from recent vaginal hysterectomy. High attenuation fluid within the central pelvis measuring 9.9 x 6.2 cm consistent with residual blood products after surgery. No adnexal masses. Other: High attenuation pelvic free fluid as described above consistent with blood products after recent surgery. No free intraperitoneal gas. No abdominal wall hernia. Musculoskeletal: No acute or destructive bony abnormalities.  Reconstructed images demonstrate no additional findings. IMPRESSION: 1. Moderate high attenuation free fluid within the lower pelvis, consistent with postoperative blood products  after recent vaginal hysterectomy. 2. Otherwise unremarkable exam. Electronically Signed   By: Sharlet Salina M.D.   On: 12/19/2022 20:46    Assessment: Principal Problem:   Post-operative nausea and vomiting Active Problems:   S/P hysterectomy   AKI (acute kidney injury) (HCC) Post operative hematoma  Plan: Admit for observation Scheduled IV tylenol & toradol for 24 hours IV dilaudid prn for breakthrough pain IV reglan prn, IV zofran prn 2nd line D5 LR + 20 mEq K @ 125cc/h Clear liquid diet  Repeat CBC & CMP in AM  Harvie Bridge, MD Obstetrician & Gynecologist, [facility] for [employer], [facility]

## 2022-12-20 NOTE — Plan of Care (Signed)
  Problem: Education: Goal: Knowledge of the prescribed therapeutic regimen will improve Outcome: Progressing Goal: Understanding of sexual limitations or changes related to disease process or condition will improve Outcome: Progressing Goal: Individualized Educational Video(s) Outcome: Progressing   Problem: Self-Concept: Goal: Communication of feelings regarding changes in body function or appearance will improve Outcome: Progressing   Problem: Skin Integrity: Goal: Demonstration of wound healing without infection will improve Outcome: Progressing   Problem: Education: Goal: Knowledge of General Education information will improve Description: Including pain rating scale, medication(s)/side effects and non-pharmacologic comfort measures Outcome: Progressing   Problem: Health Behavior/Discharge Planning: Goal: Ability to manage health-related needs will improve Outcome: Progressing   Problem: Clinical Measurements: Goal: Ability to maintain clinical measurements within normal limits will improve Outcome: Progressing Goal: Will remain free from infection Outcome: Progressing Goal: Diagnostic test results will improve Outcome: Progressing Goal: Respiratory complications will improve Outcome: Progressing Goal: Cardiovascular complication will be avoided Outcome: Progressing   Problem: Activity: Goal: Risk for activity intolerance will decrease Outcome: Progressing   Problem: Nutrition: Goal: Adequate nutrition will be maintained Outcome: Progressing   Problem: Coping: Goal: Level of anxiety will decrease Outcome: Progressing   Problem: Elimination: Goal: Will not experience complications related to bowel motility Outcome: Progressing Goal: Will not experience complications related to urinary retention Outcome: Progressing   Problem: Pain Managment: Goal: General experience of comfort will improve Outcome: Progressing   Problem: Safety: Goal: Ability to remain  free from injury will improve Outcome: Progressing   Problem: Skin Integrity: Goal: Risk for impaired skin integrity will decrease Outcome: Progressing   

## 2022-12-20 NOTE — Progress Notes (Signed)
Gynecology Progress Note  Admission Date: 12/19/2022 Current Date: 12/20/2022 11:13 AM  Holly Brown is a 37 y.o. [id number] HD#1 admitted for postoperative pain and nausea.  Post op hematoma.   History complicated by: Patient Active Problem List   Diagnosis Date Noted   Post-operative nausea and vomiting 12/19/2022   S/P hysterectomy 12/19/2022   AKI (acute kidney injury) (HCC) 12/19/2022   Post-operative state 12/16/2022   History of syncope 12/10/2022   Neuralgia 12/10/2022   Left arm numbness 12/10/2022   Chronic pain of left knee 12/10/2022   Abnormal uterine bleeding (AUB) 09/29/2022   Abnormal Pap smear of cervix 09/29/2022   Family history of colon cancer 10/10/2015    ROS and patient/family/surgical history, located on admission H&P note dated 12/19/2022, have been reviewed, and there are no changes except as noted below Yesterday/Overnight Events:  N/a  Subjective:  Pt seen this AM.  She denies having any pain currently.Pt does not report any current nausea or vomiting.  Pt has had flatus and BM since admission.  She has a clear liquid diet at bedside that she is about to try.    Objective:   Vitals:   12/20/22 0048 12/20/22 0100 12/20/22 0217 12/20/22 0827  BP: 119/60 123/67 (!) 150/76 122/64  Pulse: 66 63 67 82  Resp: 20 19 17 19   Temp: 98 F (36.7 C)  98.5 F (36.9 C) 98.3 F (36.8 C)  TempSrc: Oral  Oral Oral  SpO2: 96% 100% 100% 100%  Weight:      Height:        Temp:  [98 F (36.7 C)-98.6 F (37 C)] 98.3 F (36.8 C) (08/31 0827) Pulse Rate:  [63-95] 82 (08/31 0827) Resp:  [17-22] 19 (08/31 0827) BP: (119-152)/(60-128) 122/64 (08/31 0827) SpO2:  [96 %-100 %] 100 % (08/31 0827) Weight:  [108.9 kg] 108.9 kg (08/30 1822) I/O last 3 completed shifts: In: 2954.4 [P.O.:1600; I.V.:354.4; IV Piggyback:1000] Out: 900 [Emesis/NG output:900] No intake/output data recorded.  Intake/Output Summary (Last 24 hours) at 12/20/2022 1113 Last data filed at  12/20/2022 0519 Gross per 24 hour  Intake 2954.35 ml  Output 900 ml  Net 2054.35 ml     Current Vital Signs 24h Vital Sign Ranges  T 98.3 F (36.8 C) Temp  Avg: 98.4 F (36.9 C)  Min: 98 F (36.7 C)  Max: 98.6 F (37 C)  BP 122/64 BP  Min: 119/60  Max: 152/128  HR 82 Pulse  Avg: 76.8  Min: 63  Max: 95  RR 19 Resp  Avg: 19.5  Min: 17  Max: 22  SaO2 100 %  (ra) SpO2  Avg: 99.3 %  Min: 96 %  Max: 100 %       24 Hour I/O Current Shift I/O  Time Ins Outs 08/30 0701 - 08/31 0700 In: 2954.4 [P.O.:1600; I.V.:354.4] Out: 900  No intake/output data recorded.   Patient Vitals for the past 12 hrs:  BP Temp Temp src Pulse Resp SpO2  12/20/22 0827 122/64 98.3 F (36.8 C) Oral 82 19 100 %  12/20/22 0217 (!) 150/76 98.5 F (36.9 C) Oral 67 17 100 %  12/20/22 0100 123/67 -- -- 63 19 100 %  12/20/22 0048 119/60 98 F (36.7 C) Oral 66 20 96 %     Patient Vitals for the past 24 hrs:  BP Temp Temp src Pulse Resp SpO2 Height Weight  12/20/22 0827 122/64 98.3 F (36.8 C) Oral 82 19 100 % -- --  12/20/22 0217 (!) 150/76 98.5 F (36.9 C) Oral 67 17 100 % -- --  12/20/22 0100 123/67 -- -- 63 19 100 % -- --  12/20/22 0048 119/60 98 F (36.7 C) Oral 66 20 96 % -- --  12/19/22 2100 (!) 146/72 -- -- 88 20 100 % -- --  12/19/22 1822 -- -- -- -- -- -- 5\' 10"  (1.778 m) 108.9 kg  12/19/22 1815 (!) 152/128 98.6 F (37 C) -- 95 (!) 22 100 % -- --    Physical exam: General appearance: alert, cooperative, appears stated age, and no distress Abdomen: soft, non-tender; bowel sounds normal; no masses,  no organomegaly GU: No gross VB Lungs: clear to auscultation bilaterally Heart: regular rate and rhythm Extremities: no lower extremity edema Skin: WNL Psych: appropriate Neurologic: Grossly normal  Medications Current Facility-Administered Medications  Medication Dose Route Frequency Provider Last Rate Last Admin   acetaminophen (OFIRMEV) IV 1,000 mg  1,000 mg Intravenous Q6H Harvie Bridge  C, MD 400 mL/hr at 12/20/22 0518 1,000 mg at 12/20/22 0518   alum & mag hydroxide-simeth (MAALOX/MYLANTA) 200-200-20 MG/5ML suspension 30 mL  30 mL Oral Q4H PRN Harvie Bridge C, MD   30 mL at 12/20/22 0314   dextrose 5% in lactated ringers with KCl 20 mEq/L infusion   Intravenous Continuous Lennart Pall, MD 125 mL/hr at 12/20/22 1043 New Bag at 12/20/22 1043   HYDROmorphone (DILAUDID) injection 0.2-0.6 mg  0.2-0.6 mg Intravenous Q2H PRN Harvie Bridge C, MD   0.5 mg at 12/20/22 0431   ketorolac (TORADOL) 30 MG/ML injection 30 mg  30 mg Intravenous Q6H Harvie Bridge C, MD   30 mg at 12/20/22 0514   metoCLOPramide (REGLAN) injection 10 mg  10 mg Intravenous Q6H PRN Lennart Pall, MD   10 mg at 12/20/22 0347   ondansetron (ZOFRAN) injection 4 mg  4 mg Intravenous Q6H PRN Lennart Pall, MD       polyethylene glycol (MIRALAX / GLYCOLAX) packet 17 g  17 g Oral Daily PRN Harvie Bridge C, MD       potassium chloride 10 MEQ/100ML IVPB            potassium chloride SA (KLOR-CON M) CR tablet 40 mEq  40 mEq Oral Once Lennart Pall, MD       prenatal multivitamin tablet 1 tablet  1 tablet Oral Q1200 Lennart Pall, MD       promethazine (PHENERGAN) 12.5 mg in sodium chloride 0.9 % 50 mL IVPB  12.5 mg Intravenous Q6H PRN Lennart Pall, MD       senna (SENOKOT) tablet 8.6 mg  1 tablet Oral BID Lennart Pall, MD       simethicone [facility]) chewable tablet 80 mg  80 mg Oral QID PRN Lennart Pall, MD          Labs  Recent Labs  Lab 12/17/22 1447 12/19/22 1826 12/20/22 0511  WBC 9.9 13.4* 13.6*  HGB 9.7* 10.6* 9.8*  HCT 29.2* 32.2* 29.1*  PLT 376 446* 407*    Recent Labs  Lab 12/17/22 1447 12/19/22 1826 12/20/22 0511  NA 135 136 136  K 3.3* 3.0* 3.2*  CL 103 103 102  CO2 24 17* 19*  BUN 6 <5* <5*  CREATININE 1.06* 1.13* 0.99  CALCIUM 8.7* 9.2 9.3  PROT  --  7.3 6.8  BILITOT  --  2.0* 1.7*  ALKPHOS  --  53 50  ALT  --  23  30   AST  --  27 30  GLUCOSE 114* 161* 141*    Radiology CLINICAL DATA:  Postoperative abdominal pain, emesis, recent hysterectomy   EXAM: CT ABDOMEN AND PELVIS WITH CONTRAST   TECHNIQUE: Multidetector CT imaging of the abdomen and pelvis was performed using the standard protocol following bolus administration of intravenous contrast.   RADIATION DOSE REDUCTION: This exam was performed according to the departmental dose-optimization program which includes automated exposure control, adjustment of the mA and/or kV according to patient size and/or use of iterative reconstruction technique.   CONTRAST:  75mL OMNIPAQUE IOHEXOL 350 MG/ML SOLN   COMPARISON:  09/23/2021   FINDINGS: Lower chest: No acute pleural or parenchymal lung disease.   Hepatobiliary: No focal liver abnormality is seen. No gallstones, gallbladder wall thickening, or biliary dilatation.   Pancreas: Unremarkable. No pancreatic ductal dilatation or surrounding inflammatory changes.   Spleen: Normal in size without focal abnormality.   Adrenals/Urinary Tract: The adrenals are unremarkable. Kidneys enhance normally and symmetrically. No urinary tract calculi or obstructive uropathy. Bladder is unremarkable.   Stomach/Bowel: No bowel obstruction or ileus. No bowel wall thickening or inflammatory change. Normal appendix.   Vascular/Lymphatic: No significant vascular findings are present. No enlarged abdominal or pelvic lymph nodes.   Reproductive: Postsurgical changes from recent vaginal hysterectomy. High attenuation fluid within the central pelvis measuring 9.9 x 6.2 cm consistent with residual blood products after surgery. No adnexal masses.   Other: High attenuation pelvic free fluid as described above consistent with blood products after recent surgery. No free intraperitoneal gas. No abdominal wall hernia.   Musculoskeletal: No acute or destructive bony abnormalities. Reconstructed images demonstrate  no additional findings.   IMPRESSION: 1. Moderate high attenuation free fluid within the lower pelvis, consistent with postoperative blood products after recent vaginal hysterectomy. 2. Otherwise unremarkable exam.    Assessment & Plan:  POD 4 s/p TVH, HD 1 readmit  *Pain: currently well controlled *FEN/GI: Pt eating clear liquids, reassess in PM if pt wants to advance diet.  Postop hematoma: H/H stable, no need for evacuation at this time.  Hypokalemia:  Pt has received IV potassium and has oredrs for po potassium. Recheck BMP in AM  Code Status: Full Code   Mariel Aloe, MD Attending Center for [facility])

## 2022-12-21 LAB — CBC
HCT: 30.9 % — ABNORMAL LOW (ref 36.0–46.0)
Hemoglobin: 10.3 g/dL — ABNORMAL LOW (ref 12.0–15.0)
MCH: 29.7 pg (ref 26.0–34.0)
MCHC: 33.3 g/dL (ref 30.0–36.0)
MCV: 89 fL (ref 80.0–100.0)
Platelets: 358 10*3/uL (ref 150–400)
RBC: 3.47 MIL/uL — ABNORMAL LOW (ref 3.87–5.11)
RDW: 13.2 % (ref 11.5–15.5)
WBC: 11.5 10*3/uL — ABNORMAL HIGH (ref 4.0–10.5)
nRBC: 0 % (ref 0.0–0.2)

## 2022-12-21 LAB — BASIC METABOLIC PANEL
Anion gap: 14 (ref 5–15)
BUN: <5 mg/dL — ABNORMAL LOW (ref 6–20)
CO2: 18 mmol/L — ABNORMAL LOW (ref 22–32)
Calcium: 9.2 mg/dL (ref 8.9–10.3)
Chloride: 104 mmol/L (ref 98–111)
Creatinine, Ser: 0.95 mg/dL (ref 0.44–1.00)
GFR, Estimated: >60 mL/min (ref >60)
Glucose, Bld: 89 mg/dL (ref 70–99)
Potassium: 3.4 mmol/L — ABNORMAL LOW (ref 3.5–5.1)
Sodium: 136 mmol/L (ref 135–145)

## 2022-12-21 MED ORDER — KETOROLAC TROMETHAMINE 30 MG/ML IJ SOLN: 30.0000 mg | Freq: Four times a day (QID) | INTRAMUSCULAR | Status: DC

## 2022-12-21 MED ORDER — LIDOCAINE VISCOUS HCL 2 % MT SOLN
15.0000 mL | Freq: Once | OROMUCOSAL | Status: AC
  Administered 2022-12-21: 15 mL via ORAL
  Filled 2022-12-21: qty 15

## 2022-12-21 MED ORDER — OXYCODONE HCL 5 MG PO TABS: 5.0000 mg | ORAL_TABLET | ORAL | Status: DC | PRN

## 2022-12-21 MED ORDER — HYDROMORPHONE HCL 1 MG/ML IJ SOLN: 0.2000 mg | INTRAMUSCULAR | Status: DC | PRN

## 2022-12-21 MED ORDER — KETOROLAC TROMETHAMINE 30 MG/ML IJ SOLN
30.0000 mg | Freq: Once | INTRAMUSCULAR | Status: DC
  Administered 2022-12-21: 30 mg via INTRAVENOUS

## 2022-12-21 MED ORDER — ALUM & MAG HYDROXIDE-SIMETH 200-200-20 MG/5ML PO SUSP
30.0000 mL | Freq: Once | ORAL | Status: AC
  Administered 2022-12-21: 30 mL via ORAL
  Filled 2022-12-21: qty 30

## 2022-12-21 MED ORDER — PANTOPRAZOLE SODIUM 40 MG PO TBEC
40.0000 mg | DELAYED_RELEASE_TABLET | Freq: Every day | ORAL | Status: DC
  Administered 2022-12-21 – 2022-12-22 (×2): 40 mg via ORAL
  Filled 2022-12-21 (×2): qty 1

## 2022-12-21 MED ORDER — KETOROLAC TROMETHAMINE 30 MG/ML IJ SOLN
30.0000 mg | INTRAMUSCULAR | Status: AC
  Administered 2022-12-21: 30 mg via INTRAVENOUS
  Filled 2022-12-21: qty 1

## 2022-12-21 MED ORDER — OXYCODONE HCL 5 MG PO TABS: 10.0000 mg | ORAL_TABLET | ORAL | Status: DC | PRN

## 2022-12-21 MED ORDER — ACETAMINOPHEN 10 MG/ML IV SOLN
1000.0000 mg | Freq: Four times a day (QID) | INTRAVENOUS | Status: AC
  Administered 2022-12-21 – 2022-12-22 (×4): 1000 mg via INTRAVENOUS
  Filled 2022-12-21 (×4): qty 100

## 2022-12-21 NOTE — Progress Notes (Signed)
Gynecology Progress Note  Admission Date: 12/19/2022 Current Date: 12/21/2022 7:43 PM  Holly Brown is a 37 y.o. [id number] POD5 s/p TVH/BS, now HD#2 admitted for postoperative pain and intractable nausea & vomiting.  Post op hematoma.   History complicated by: Patient Active Problem List   Diagnosis Date Noted   Post-operative nausea and vomiting 12/19/2022   S/P hysterectomy 12/19/2022   AKI (acute kidney injury) (HCC) 12/19/2022   Post-operative state 12/16/2022   History of syncope 12/10/2022   Neuralgia 12/10/2022   Left arm numbness 12/10/2022   Chronic pain of left knee 12/10/2022   Abnormal uterine bleeding (AUB) 09/29/2022   Abnormal Pap smear of cervix 09/29/2022   Family history of colon cancer 10/10/2015   Subjective:  Called to evaluate patient this morning around 10am. Pt with severe pain that started after trying to take a bowel movement. Shortly after pt developed profuse nausea/vomiting. On arrival, pt actively vomiting and diaphoretic, but abdomen soft and nontender. Pt received IV tylenol, IV toradal, and IV dilaudid 0.6mg  x2 as well as IV antiemetics and her symptoms resolved. Approximately 3 hours later pt again called out with severe pain and received IV dilaudid 0.6mg  and IV antiemetics. On re-evaluation, pt reports persistent epigastric pain. Is not able to tolerate clears. Reports that her lower abdominal cramping has resolved. Is passing flatus and having bowel movements. No fevers/chills, hematemesis.   Objective:   Vitals:   12/21/22 0446 12/21/22 0940 12/21/22 1051 12/21/22 1521  BP: 121/76 (!) 141/79 (!) 159/92 (!) 158/84  Pulse: 73 86 75 71  Resp: 16  20 20   Temp: 98.7 F (37.1 C) 98.6 F (37 C) 98.4 F (36.9 C) 98.4 F (36.9 C)  TempSrc: Oral  Oral Oral  SpO2: 100% 99% 99% 100%  Weight:      Height:        Temp:  [98.4 F (36.9 C)-98.7 F (37.1 C)] 98.4 F (36.9 C) (09/01 1521) Pulse Rate:  [71-86] 71 (09/01 1521) Resp:  [16-20] 20 (09/01  1521) BP: (121-159)/(76-92) 158/84 (09/01 1521) SpO2:  [99 %-100 %] 100 % (09/01 1521) I/O last 3 completed shifts: In: 5395.5 [P.O.:835; I.V.:4310.5; IV Piggyback:250] Out: 3300 [Urine:2450; Emesis/NG output:850] No intake/output data recorded.  Intake/Output Summary (Last 24 hours) at 12/21/2022 1943 Last data filed at 12/21/2022 1855 Gross per 24 hour  Intake 3366.34 ml  Output 2400 ml  Net 966.34 ml     Current Vital Signs 24h Vital Sign Ranges  T 98.4 F (36.9 C) Temp  Avg: 98.5 F (36.9 C)  Min: 98.4 F (36.9 C)  Max: 98.7 F (37.1 C)  BP (!) 158/84 (RN notified) BP  Min: 121/76  Max: 159/92  HR 71 Pulse  Avg: 78.7  Min: 71  Max: 86  RR 20 Resp  Avg: 18  Min: 16  Max: 20  SaO2 100 %  (ra) SpO2  Avg: 99.7 %  Min: 99 %  Max: 100 %       24 Hour I/O Current Shift I/O  Time Ins Outs 08/31 0701 - 09/01 0700 In: 2029.2 [P.O.:600; I.V.:1429.2] Out: 900 [Urine:900] No intake/output data recorded.   Patient Vitals for the past 12 hrs:  BP Temp Temp src Pulse Resp SpO2  12/21/22 1521 (!) 158/84 98.4 F (36.9 C) Oral 71 20 100 %  12/21/22 1051 (!) 159/92 98.4 F (36.9 C) Oral 75 20 99 %  12/21/22 0940 (!) 141/79 98.6 F (37 C) -- 86 -- 99 %  Patient Vitals for the past 24 hrs:  BP Temp Temp src Pulse Resp SpO2  12/21/22 1521 (!) 158/84 98.4 F (36.9 C) Oral 71 20 100 %  12/21/22 1051 (!) 159/92 98.4 F (36.9 C) Oral 75 20 99 %  12/21/22 0940 (!) 141/79 98.6 F (37 C) -- 86 -- 99 %  12/21/22 0446 121/76 98.7 F (37.1 C) Oral 73 16 100 %  12/21/22 0019 130/80 98.6 F (37 C) Oral 82 16 100 %  12/20/22 2035 (!) 151/90 98.5 F (36.9 C) Oral 85 18 100 %    Physical exam: General appearance: alert, cooperative, appears stated age, and no distress Abdomen: soft, non tender, non distended Lungs:  normal respiratory effort Heart: regular rate and rhythm  Medications Current Facility-Administered Medications  Medication Dose Route Frequency Provider Last Rate Last  Admin   acetaminophen (OFIRMEV) IV 1,000 mg  1,000 mg Intravenous Q6H Harvie Bridge C, MD 400 mL/hr at 12/21/22 1742 1,000 mg at 12/21/22 1742   alum & mag hydroxide-simeth (MAALOX/MYLANTA) 200-200-20 MG/5ML suspension 30 mL  30 mL Oral Q4H PRN Harvie Bridge C, MD   30 mL at 12/20/22 0314   dextrose 5% in lactated ringers with KCl 20 mEq/L infusion   Intravenous Continuous Lennart Pall, MD 125 mL/hr at 12/21/22 1742 New Bag at 12/21/22 1742   HYDROmorphone (DILAUDID) injection 0.2-0.6 mg  0.2-0.6 mg Intravenous Q2H PRN Harvie Bridge C, MD   0.6 mg at 12/21/22 1609   metoCLOPramide (REGLAN) injection 10 mg  10 mg Intravenous Q6H PRN Harvie Bridge C, MD   10 mg at 12/21/22 1032   ondansetron (ZOFRAN) injection 4 mg  4 mg Intravenous Q6H PRN Harvie Bridge C, MD   4 mg at 12/21/22 1150   pantoprazole (PROTONIX) EC tablet 40 mg  40 mg Oral Daily Harvie Bridge C, MD   40 mg at 12/21/22 1744   polyethylene glycol (MIRALAX / GLYCOLAX) packet 17 g  17 g Oral Daily PRN Lennart Pall, MD       potassium chloride SA (KLOR-CON M) CR tablet 40 mEq  40 mEq Oral Once Lennart Pall, MD       prenatal multivitamin tablet 1 tablet  1 tablet Oral Q1200 Lennart Pall, MD       promethazine (PHENERGAN) 12.5 mg in sodium chloride 0.9 % 50 mL IVPB  12.5 mg Intravenous Q6H PRN Harvie Bridge C, MD 200 mL/hr at 12/21/22 1040 12.5 mg at 12/21/22 1040   senna (SENOKOT) tablet 8.6 mg  1 tablet Oral BID Harvie Bridge C, MD   8.6 mg at 12/20/22 2205   simethicone (MYLICON) chewable tablet 80 mg  80 mg Oral QID PRN Lennart Pall, MD        Labs  Recent Labs  Lab 12/19/22 1826 12/20/22 0511 12/21/22 1032  WBC 13.4* 13.6* 11.5*  HGB 10.6* 9.8* 10.3*  HCT 32.2* 29.1* 30.9*  PLT 446* 407* 358    Recent Labs  Lab 12/19/22 1826 12/20/22 0511 12/21/22 1032  NA 136 136 136  K 3.0* 3.2* 3.4*  CL 103 102 104  CO2 17* 19* 18*  BUN <5* <5* <5*  CREATININE 1.13*  0.99 0.95  CALCIUM 9.2 9.3 9.2  PROT 7.3 6.8  --   BILITOT 2.0* 1.7*  --   ALKPHOS 53 50  --   ALT 23 30  --   AST 27 30  --   GLUCOSE 161* 141* 89   Radiology CLINICAL  DATA:  Postoperative abdominal pain, emesis, recent hysterectomy   EXAM: CT ABDOMEN AND PELVIS WITH CONTRAST   TECHNIQUE: Multidetector CT imaging of the abdomen and pelvis was performed using the standard protocol following bolus administration of intravenous contrast.   RADIATION DOSE REDUCTION: This exam was performed according to the departmental dose-optimization program which includes automated exposure control, adjustment of the mA and/or kV according to patient size and/or use of iterative reconstruction technique.   CONTRAST:  75mL OMNIPAQUE IOHEXOL 350 MG/ML SOLN   COMPARISON:  09/23/2021   FINDINGS: Lower chest: No acute pleural or parenchymal lung disease.   Hepatobiliary: No focal liver abnormality is seen. No gallstones, gallbladder wall thickening, or biliary dilatation.   Pancreas: Unremarkable. No pancreatic ductal dilatation or surrounding inflammatory changes.   Spleen: Normal in size without focal abnormality.   Adrenals/Urinary Tract: The adrenals are unremarkable. Kidneys enhance normally and symmetrically. No urinary tract calculi or obstructive uropathy. Bladder is unremarkable.   Stomach/Bowel: No bowel obstruction or ileus. No bowel wall thickening or inflammatory change. Normal appendix.   Vascular/Lymphatic: No significant vascular findings are present. No enlarged abdominal or pelvic lymph nodes.   Reproductive: Postsurgical changes from recent vaginal hysterectomy. High attenuation fluid within the central pelvis measuring 9.9 x 6.2 cm consistent with residual blood products after surgery. No adnexal masses.   Other: High attenuation pelvic free fluid as described above consistent with blood products after recent surgery. No free intraperitoneal gas. No abdominal  wall hernia.   Musculoskeletal: No acute or destructive bony abnormalities. Reconstructed images demonstrate no additional findings.   IMPRESSION: 1. Moderate high attenuation free fluid within the lower pelvis, consistent with postoperative blood products after recent vaginal hysterectomy. 2. Otherwise unremarkable exam.    Assessment & Plan:  POD5 s/p TVH, HD2 readmit  *Pain: Suspect this may be unrelated to her post op pain given location. Potential gastritis but has a very benign abdominal exam.  - add PPI & GI cocktail prn - Hold NSAIDs - continue scheduled tylenol with oxycodone prn and dilaudid for breakthrough.  - pt self regulating. Not currently able to tolerate clears.  - If symptoms recur after adding PPI and GI cocktails, will obtain repeat imaging.   Postop hematoma: H/H continues to be stable, no need for evacuation at this time.  Hypokalemia: Repletion prn  AKI: Resolved  Code Status: Full Code Dispo: Continue inpatient management. Likely discharge home tomorrow if able to control nausea/vomiting & pain.   Harvie Bridge, MD Obstetrician & Gynecologist, [facility] [employer], [facility]

## 2022-12-21 NOTE — Progress Notes (Signed)
Lab notified this RN that the CMP drawn this AM was contaminated. Lab returned to redraw CMP, but patient refused. MD notified.

## 2022-12-22 ENCOUNTER — Other Ambulatory Visit: Payer: Self-pay

## 2022-12-22 ENCOUNTER — Inpatient Hospital Stay (HOSPITAL_COMMUNITY): Payer: 59

## 2022-12-22 ENCOUNTER — Inpatient Hospital Stay (HOSPITAL_COMMUNITY)
Admission: AD | Admit: 2022-12-22 | Discharge: 2022-12-26 | DRG: 948 | Disposition: A | Discharge status: Home / Self Care | Payer: 59 | Attending: Obstetrics and Gynecology | Admitting: Obstetrics and Gynecology

## 2022-12-22 ENCOUNTER — Encounter (HOSPITAL_COMMUNITY): Payer: Self-pay | Admitting: *Deleted

## 2022-12-22 DIAGNOSIS — G8918 Other acute postprocedural pain: Secondary | ICD-10-CM | POA: Diagnosis not present

## 2022-12-22 DIAGNOSIS — Z800 Family history of malignant neoplasm of digestive organs: Secondary | ICD-10-CM

## 2022-12-22 DIAGNOSIS — K259 Gastric ulcer, unspecified as acute or chronic, without hemorrhage or perforation: Secondary | ICD-10-CM | POA: Diagnosis not present

## 2022-12-22 DIAGNOSIS — K269 Duodenal ulcer, unspecified as acute or chronic, without hemorrhage or perforation: Secondary | ICD-10-CM

## 2022-12-22 DIAGNOSIS — Z833 Family history of diabetes mellitus: Secondary | ICD-10-CM | POA: Diagnosis not present

## 2022-12-22 DIAGNOSIS — D649 Anemia, unspecified: Secondary | ICD-10-CM | POA: Diagnosis not present

## 2022-12-22 DIAGNOSIS — R102 Pelvic and perineal pain: Secondary | ICD-10-CM | POA: Diagnosis not present

## 2022-12-22 DIAGNOSIS — Z823 Family history of stroke: Secondary | ICD-10-CM

## 2022-12-22 DIAGNOSIS — Z87891 Personal history of nicotine dependence: Secondary | ICD-10-CM

## 2022-12-22 DIAGNOSIS — R1012 Left upper quadrant pain: Secondary | ICD-10-CM

## 2022-12-22 DIAGNOSIS — N9489 Other specified conditions associated with female genital organs and menstrual cycle: Secondary | ICD-10-CM | POA: Diagnosis not present

## 2022-12-22 DIAGNOSIS — C539 Malignant neoplasm of cervix uteri, unspecified: Secondary | ICD-10-CM | POA: Diagnosis present

## 2022-12-22 DIAGNOSIS — R109 Unspecified abdominal pain: Secondary | ICD-10-CM | POA: Diagnosis not present

## 2022-12-22 DIAGNOSIS — K449 Diaphragmatic hernia without obstruction or gangrene: Secondary | ICD-10-CM | POA: Diagnosis present

## 2022-12-22 DIAGNOSIS — E876 Hypokalemia: Secondary | ICD-10-CM | POA: Diagnosis not present

## 2022-12-22 DIAGNOSIS — R112 Nausea with vomiting, unspecified: Secondary | ICD-10-CM | POA: Diagnosis not present

## 2022-12-22 DIAGNOSIS — Z87440 Personal history of urinary (tract) infections: Secondary | ICD-10-CM | POA: Diagnosis not present

## 2022-12-22 DIAGNOSIS — Z90710 Acquired absence of both cervix and uterus: Secondary | ICD-10-CM

## 2022-12-22 DIAGNOSIS — Z9889 Other specified postprocedural states: Secondary | ICD-10-CM | POA: Diagnosis not present

## 2022-12-22 DIAGNOSIS — N3000 Acute cystitis without hematuria: Secondary | ICD-10-CM | POA: Diagnosis not present

## 2022-12-22 DIAGNOSIS — N99820 Postprocedural hemorrhage and hematoma of a genitourinary system organ or structure following a genitourinary system procedure: Secondary | ICD-10-CM | POA: Diagnosis not present

## 2022-12-22 LAB — COMPREHENSIVE METABOLIC PANEL
ALT: 29 U/L (ref 0–44)
AST: 24 U/L (ref 15–41)
Albumin: 4.1 g/dL (ref 3.5–5.0)
Alkaline Phosphatase: 62 U/L (ref 38–126)
Anion gap: 13 (ref 5–15)
BUN: 5 mg/dL — ABNORMAL LOW (ref 6–20)
CO2: 19 mmol/L — ABNORMAL LOW (ref 22–32)
Calcium: 9.6 mg/dL (ref 8.9–10.3)
Chloride: 107 mmol/L (ref 98–111)
Creatinine, Ser: 1.16 mg/dL — ABNORMAL HIGH (ref 0.44–1.00)
GFR, Estimated: >60 mL/min (ref >60)
Glucose, Bld: 132 mg/dL — ABNORMAL HIGH (ref 70–99)
Potassium: 3 mmol/L — ABNORMAL LOW (ref 3.5–5.1)
Sodium: 139 mmol/L (ref 135–145)
Total Bilirubin: 2.2 mg/dL — ABNORMAL HIGH (ref 0.3–1.2)
Total Protein: 7.3 g/dL (ref 6.5–8.1)

## 2022-12-22 LAB — CBC WITH DIFFERENTIAL/PLATELET
Abs Immature Granulocytes: 0.08 10*3/uL — ABNORMAL HIGH (ref 0.00–0.07)
Basophils Absolute: 0.1 10*3/uL (ref 0.0–0.1)
Basophils Relative: 1 %
Eosinophils Absolute: 0 10*3/uL (ref 0.0–0.5)
Eosinophils Relative: 0 %
HCT: 34.8 % — ABNORMAL LOW (ref 36.0–46.0)
Hemoglobin: 11.6 g/dL — ABNORMAL LOW (ref 12.0–15.0)
Immature Granulocytes: 1 %
Lymphocytes Relative: 13 %
Lymphs Abs: 2.2 10*3/uL (ref 0.7–4.0)
MCH: 30.2 pg (ref 26.0–34.0)
MCHC: 33.3 g/dL (ref 30.0–36.0)
MCV: 90.6 fL (ref 80.0–100.0)
Monocytes Absolute: 1.1 10*3/uL — ABNORMAL HIGH (ref 0.1–1.0)
Monocytes Relative: 7 %
Neutro Abs: 13.5 10*3/uL — ABNORMAL HIGH (ref 1.7–7.7)
Neutrophils Relative %: 78 %
Platelets: 508 10*3/uL — ABNORMAL HIGH (ref 150–400)
RBC: 3.84 MIL/uL — ABNORMAL LOW (ref 3.87–5.11)
RDW: 13.3 % (ref 11.5–15.5)
WBC: 17 10*3/uL — ABNORMAL HIGH (ref 4.0–10.5)
nRBC: 0 % (ref 0.0–0.2)

## 2022-12-22 LAB — URINALYSIS, ROUTINE W REFLEX MICROSCOPIC
Bilirubin Urine: NEGATIVE
Glucose, UA: 150 mg/dL — AB
Ketones, ur: 20 mg/dL — AB
Leukocytes,Ua: NEGATIVE
Nitrite: NEGATIVE
Protein, ur: NEGATIVE mg/dL
Specific Gravity, Urine: 1.041 — ABNORMAL HIGH (ref 1.005–1.030)
pH: 7 (ref 5.0–8.0)

## 2022-12-22 LAB — TYPE AND SCREEN
ABO/RH(D): A POS
Antibody Screen: NEGATIVE

## 2022-12-22 MED ORDER — BISACODYL 10 MG RE SUPP: 10.0000 mg | Freq: Every day | RECTAL | Status: DC | PRN

## 2022-12-22 MED ORDER — HYOSCYAMINE SULFATE 0.125 MG PO TABS
0.1250 mg | ORAL_TABLET | Freq: Once | ORAL | Status: DC
  Filled 2022-12-22 (×2): qty 1

## 2022-12-22 MED ORDER — OXYCODONE-ACETAMINOPHEN 5-325 MG PO TABS
1.0000 | ORAL_TABLET | ORAL | Status: DC | PRN
  Administered 2022-12-22 – 2022-12-23 (×2): 2 via ORAL
  Filled 2022-12-22 (×2): qty 2

## 2022-12-22 MED ORDER — ALUM & MAG HYDROXIDE-SIMETH 200-200-20 MG/5ML PO SUSP
30.0000 mL | ORAL | Status: DC | PRN
  Filled 2022-12-22: qty 30

## 2022-12-22 MED ORDER — PRENATAL MULTIVITAMIN CH: 1.0000 | ORAL_TABLET | Freq: Every day | ORAL | Status: DC

## 2022-12-22 MED ORDER — PANTOPRAZOLE SODIUM 40 MG PO TBEC
40.0000 mg | DELAYED_RELEASE_TABLET | Freq: Every day | ORAL | Status: DC
  Administered 2022-12-23: 40 mg via ORAL
  Filled 2022-12-22: qty 1

## 2022-12-22 MED ORDER — HYDROMORPHONE HCL 1 MG/ML IJ SOLN
1.0000 mg | Freq: Once | INTRAMUSCULAR | Status: AC
  Administered 2022-12-22: 1 mg via INTRAVENOUS
  Filled 2022-12-22: qty 1

## 2022-12-22 MED ORDER — ALUM & MAG HYDROXIDE-SIMETH 200-200-20 MG/5ML PO SUSP
30.0000 mL | ORAL | 0 refills | Status: DC | PRN
Start: 2022-12-22 — End: 2023-01-07
  Filled 2022-12-22: qty 355, 2d supply, fill #0

## 2022-12-22 MED ORDER — KETOROLAC TROMETHAMINE 30 MG/ML IJ SOLN
30.0000 mg | Freq: Once | INTRAMUSCULAR | Status: AC
  Administered 2022-12-22: 30 mg via INTRAVENOUS
  Filled 2022-12-22: qty 1

## 2022-12-22 MED ORDER — SIMETHICONE 80 MG PO CHEW: 160.0000 mg | CHEWABLE_TABLET | Freq: Three times a day (TID) | ORAL | Status: DC | PRN

## 2022-12-22 MED ORDER — PANTOPRAZOLE SODIUM 40 MG PO TBEC
40.0000 mg | DELAYED_RELEASE_TABLET | Freq: Every day | ORAL | 0 refills | Status: DC
  Filled 2022-12-22: qty 30, 30d supply, fill #0

## 2022-12-22 MED ORDER — LABETALOL HCL 5 MG/ML IV SOLN
20.0000 mg | INTRAVENOUS | Status: DC | PRN
  Administered 2022-12-22 (×2): 20 mg via INTRAVENOUS
  Filled 2022-12-22 (×2): qty 4

## 2022-12-22 MED ORDER — ONDANSETRON HCL 4 MG/2ML IJ SOLN
4.0000 mg | Freq: Four times a day (QID) | INTRAMUSCULAR | Status: DC | PRN
  Filled 2022-12-22: qty 2

## 2022-12-22 MED ORDER — ONDANSETRON HCL 4 MG PO TABS
4.0000 mg | ORAL_TABLET | Freq: Four times a day (QID) | ORAL | Status: DC | PRN
  Administered 2022-12-22 – 2022-12-24 (×2): 4 mg via ORAL
  Filled 2022-12-22 (×3): qty 1

## 2022-12-22 MED ORDER — HYOSCYAMINE SULFATE 0.125 MG SL SUBL
0.1250 mg | SUBLINGUAL_TABLET | Freq: Once | SUBLINGUAL | Status: AC
  Administered 2022-12-22: 0.125 mg via ORAL
  Filled 2022-12-22: qty 1

## 2022-12-22 MED ORDER — IOHEXOL 350 MG/ML SOLN
75.0000 mL | Freq: Once | INTRAVENOUS | Status: AC | PRN
  Administered 2022-12-22: 75 mL via INTRAVENOUS

## 2022-12-22 MED ORDER — ONDANSETRON HCL 4 MG/2ML IJ SOLN
4.0000 mg | Freq: Once | INTRAMUSCULAR | Status: AC
  Administered 2022-12-22: 4 mg via INTRAVENOUS
  Filled 2022-12-22: qty 2

## 2022-12-22 MED ORDER — SENNA 8.6 MG PO TABS
1.0000 | ORAL_TABLET | Freq: Two times a day (BID) | ORAL | Status: DC
  Administered 2022-12-22 – 2022-12-26 (×7): 8.6 mg via ORAL
  Filled 2022-12-22 (×10): qty 1

## 2022-12-22 MED ORDER — OXYCODONE HCL 5 MG PO TABS
10.0000 mg | ORAL_TABLET | Freq: Once | ORAL | Status: AC
  Administered 2022-12-22: 10 mg via ORAL
  Filled 2022-12-22: qty 2

## 2022-12-22 MED ORDER — IBUPROFEN 600 MG PO TABS
600.0000 mg | ORAL_TABLET | Freq: Four times a day (QID) | ORAL | Status: DC | PRN
  Administered 2022-12-22: 600 mg via ORAL
  Filled 2022-12-22: qty 1

## 2022-12-22 NOTE — MAU Note (Signed)
Holly Brown is a 37 y.o. at Unknown here in MAU reporting: had vaginal hysterectomy last Tues.    Was just dc'd today at 1230.  When she got home around 1320, she took the medication for bad cramping.  Thinks it is making her sick.  She started throwing up about 15-42min later.  No bleeding.  Onset of complaint: 1345 Pain score: 10 There were no vitals filed for this visit.    Lab orders placed from triage:

## 2022-12-22 NOTE — MAU Note (Signed)
Spoke with CT. Patient being placed in request for transport.

## 2022-12-22 NOTE — Plan of Care (Signed)

## 2022-12-22 NOTE — MAU Provider Note (Signed)
Chief Complaint: Abdominal Pain, Nausea, and Emesis   None     SUBJECTIVE HPI: Holly Brown is a 37 y.o. [id number] who is s/p vaginal hysterectomy on 12/16/22 who presents to maternity admissions reporting she took her pain medication today after her discharge from the hospital, then vomited, and now has severe lower abdominal pain. She did not want to take medicine at the hospital and wanted to get home first.  Her pain was mild at that time.  She is moaning and unable to undress herself in MAU due to pain.    HPI  Past Medical History:  Diagnosis Date   Gestational diabetes    metformin - resolved after delivery   History of UTI    HSV (herpes simplex virus) infection    Migraines    Peripheral vascular disease (HCC)    Post-operative nausea and vomiting 12/19/2022   Seizures (HCC)    Jan.2016 - only had one seizure, none since   Vaginal Pap smear, abnormal    Past Surgical History:  Procedure Laterality Date   DILATION AND CURETTAGE OF UTERUS     LAPAROSCOPIC TUBAL LIGATION Bilateral 12/08/2018   Procedure: LAPAROSCOPIC TUBAL LIGATION;  Surgeon: Catalina Antigua, MD;  Location: [facility];  Service: Gynecology;  Laterality: Bilateral;   LEEP N/A 03/18/2022   Procedure: LOOP ELECTROSURGICAL EXCISION PROCEDURE (LEEP);  Surgeon: Catalina Antigua, MD;  Location: [facility] OR;  Service: Gynecology;  Laterality: N/A;   VAGINAL HYSTERECTOMY N/A 12/16/2022   Procedure: TOTAL VAGINAL HYSTERECTOMY, BILATERAL SALPINGECTOMY;  Surgeon: Hermina Staggers, MD;  Location: [facility] OR;  Service: Gynecology;  Laterality: N/A;   WISDOM TOOTH EXTRACTION  04/21/2009   Social History   Socioeconomic History   Marital status: Single    Spouse name: Not on file   Number of children: 3   Years of education: college   Highest education level: Not on file  Occupational History    Employer: [employer]    Comment: service desk analyst  Tobacco Use   Smoking status: Former    Current packs/day:  0.25    Average packs/day: 0.3 packs/day for 11.0 years (2.8 ttl pk-yrs)    Types: Cigarettes    Start date: 12/21/2011    Passive exposure: Never   Smokeless tobacco: Never  Vaping Use   Vaping status: Never Used  Substance and Sexual Activity   Alcohol use: Not Currently    Comment: ocassionally    Drug use: Not Currently    Types: Marijuana    Comment: Last use was in 2023   Sexual activity: Not Currently    Partners: Male    Birth control/protection: Surgical    Comment: Tubal Ligation  Other Topics Concern   Not on file  Social History Narrative   Lives with her grandmother and her three children   Does not drink caffeine    Social Determinants of Health   Financial Resource Strain: Low Risk  (02/01/2018)   Overall Financial Resource Strain (CARDIA)    Difficulty of Paying Living Expenses: Not hard at all  Food Insecurity: No Food Insecurity (12/20/2022)   Hunger Vital Sign    Worried About Running Out of Food in the Last Year: Never true    Ran Out of Food in the Last Year: Never true  Transportation Needs: No Transportation Needs (12/20/2022)   PRAPARE - Administrator, Civil Service (Medical): No    Lack of Transportation (Non-Medical): No  Physical Activity: Not on file  Stress: No Stress Concern Present (08/17/2018)   [employer] of Occupational Health - Occupational Stress Questionnaire    Feeling of Stress : Not at all  Social Connections: Unknown (08/31/2018)   Social Connection and Isolation Panel [NHANES]    Frequency of Communication with Friends and Family: More than three times a week    Frequency of Social Gatherings with Friends and Family: More than three times a week    Attends Religious Services: Not on file    Active Member of Clubs or Organizations: Not on file    Attends Banker Meetings: Not on file    Marital Status: Not on file  Intimate Partner Violence: Not At Risk (12/20/2022)   Humiliation, Afraid, Rape, and  Kick questionnaire    Fear of Current or Ex-Partner: No    Emotionally Abused: No    Physically Abused: No    Sexually Abused: No   No current facility-administered medications on file prior to encounter.   Current Outpatient Medications on File Prior to Encounter  Medication Sig Dispense Refill   HYDROmorphone (DILAUDID) 2 MG tablet Take 1 tablet (2 mg total) by mouth every 4 (four) hours as needed for severe pain. 30 tablet 0   alum & mag hydroxide-simeth (MAALOX/MYLANTA) 200-200-20 MG/5ML suspension Take 30 mLs by mouth every 4 (four) hours as needed for indigestion. 355 mL 0   HYDROmorphone (DILAUDID) 2 MG tablet Take 1 tablet (2 mg total) by mouth every 4 (four) hours as needed for severe pain. 30 tablet 0   pantoprazole (PROTONIX) 40 MG tablet Take 1 tablet (40 mg total) by mouth daily. 30 tablet 0   promethazine (PHENERGAN) 25 MG tablet Take 1 tablet (25 mg total) by mouth every 6 (six) hours as needed for nausea or vomiting. 15 tablet 0   senna (SENOKOT) 8.6 MG TABS tablet Take 1 tablet (8.6 mg total) by mouth 2 (two) times daily. 20 tablet 0   simethicone (MYLICON) 80 MG chewable tablet Chew 1 tablet (80 mg total) by mouth 4 (four) times daily as needed for flatulence. 100 tablet 0   No Known Allergies  ROS:  Review of Systems  Constitutional:  Negative for chills, fatigue and fever.  Respiratory:  Negative for shortness of breath.   Cardiovascular:  Negative for chest pain.  Gastrointestinal:  Positive for abdominal pain, nausea and vomiting. Negative for diarrhea.  Genitourinary:  Positive for pelvic pain. Negative for difficulty urinating, dysuria, flank pain, vaginal bleeding, vaginal discharge and vaginal pain.  Musculoskeletal:  Negative for back pain.  Neurological:  Negative for dizziness and headaches.  Psychiatric/Behavioral: Negative.       I have reviewed patient's Past Medical Hx, Surgical Hx, Family Hx, Social Hx, medications and allergies.   Physical Exam   Patient Vitals for the past 24 hrs:  BP Temp Temp src Pulse Resp SpO2  12/22/22 1715 (!) 180/90 -- -- (!) 59 -- 100 %  12/22/22 1709 -- -- -- -- -- 100 %  12/22/22 1704 -- -- -- -- -- 100 %  12/22/22 1703 (!) 144/94 -- -- 86 -- --  12/22/22 1700 -- -- -- -- -- 100 %  12/22/22 1630 -- -- -- -- -- 100 %  12/22/22 1629 -- -- -- -- -- 100 %  12/22/22 1619 -- -- -- -- -- 100 %  12/22/22 1615 (!) 147/108 -- -- 67 20 --  12/22/22 1614 -- -- -- -- -- 100 %  12/22/22 1609 -- -- -- -- --  100 %  12/22/22 1604 (!) 164/107 -- -- 76 -- 100 %  12/22/22 1532 (!) 165/86 -- -- 70 -- 100 %  12/22/22 1524 (!) 174/86 -- -- 81 -- 100 %  12/22/22 1514 (!) 167/109 -- Oral (!) 109 20 100 %  12/22/22 1445 114/76 98.2 F (36.8 C) Oral (!) 105 (!) 22 100 %   Constitutional: Well-developed, well-nourished female in no acute distress.  Cardiovascular: normal rate Respiratory: normal effort GI: Abd soft, non-tender. Pos BS x 4 MS: Extremities nontender, no edema, normal ROM Neurologic: Alert and oriented x 4.  GU: Neg CVAT.  PELVIC EXAM: Deferred. Dr Donavan Foil consulted.    LAB RESULTS Results for orders placed or performed during the hospital encounter of 12/22/22 (from the past 24 hour(s))  Type and screen [facility]     Status: None   Collection Time: 12/22/22  3:20 PM  Result Value Ref Range   ABO/RH(D) A POS    Antibody Screen NEG    Sample Expiration      12/25/2022,2359 Performed at [facility], [address], [place], [place] [address]   CBC with Differential/Platelet     Status: Abnormal   Collection Time: 12/22/22  3:22 PM  Result Value Ref Range   WBC 17.0 (H) 4.0 - 10.5 K/uL   RBC 3.84 (L) 3.87 - 5.11 MIL/uL   Hemoglobin 11.6 (L) 12.0 - 15.0 g/dL   HCT 60.4 (L) 54.0 - 98.1 %   MCV 90.6 80.0 - 100.0 fL   MCH 30.2 26.0 - 34.0 pg   MCHC 33.3 30.0 - 36.0 g/dL   RDW 19.1 47.8 - 29.5 %   Platelets 508 (H) 150 - 400 K/uL   nRBC 0.0 0.0 - 0.2 %   Neutrophils  Relative % 78 %   Neutro Abs 13.5 (H) 1.7 - 7.7 K/uL   Lymphocytes Relative 13 %   Lymphs Abs 2.2 0.7 - 4.0 K/uL   Monocytes Relative 7 %   Monocytes Absolute 1.1 (H) 0.1 - 1.0 K/uL   Eosinophils Relative 0 %   Eosinophils Absolute 0.0 0.0 - 0.5 K/uL   Basophils Relative 1 %   Basophils Absolute 0.1 0.0 - 0.1 K/uL   Immature Granulocytes 1 %   Abs Immature Granulocytes 0.08 (H) 0.00 - 0.07 K/uL  Comprehensive metabolic panel     Status: Abnormal   Collection Time: 12/22/22  3:22 PM  Result Value Ref Range   Sodium 139 135 - 145 mmol/L   Potassium 3.0 (L) 3.5 - 5.1 mmol/L   Chloride 107 98 - 111 mmol/L   CO2 19 (L) 22 - 32 mmol/L   Glucose, Bld 132 (H) 70 - 99 mg/dL   BUN 5 (L) 6 - 20 mg/dL   Creatinine, Ser 6.21 (H) 0.44 - 1.00 mg/dL   Calcium 9.6 8.9 - 30.8 mg/dL   Total Protein 7.3 6.5 - 8.1 g/dL   Albumin 4.1 3.5 - 5.0 g/dL   AST 24 15 - 41 U/L   ALT 29 0 - 44 U/L   Alkaline Phosphatase 62 38 - 126 U/L   Total Bilirubin 2.2 (H) 0.3 - 1.2 mg/dL   GFR, Estimated >65 >78 mL/min   Anion gap 13 5 - 15    --/--/A POS (09/02 1520)  IMAGING CT ABDOMEN PELVIS W CONTRAST  Result Date: 12/22/2022 CLINICAL DATA:  Postop abdominal pain.  Recent vaginal hysterectomy. EXAM: CT ABDOMEN AND PELVIS WITH CONTRAST TECHNIQUE: Multidetector CT imaging  of the abdomen and pelvis was performed using the standard protocol following bolus administration of intravenous contrast. RADIATION DOSE REDUCTION: This exam was performed according to the departmental dose-optimization program which includes automated exposure control, adjustment of the mA and/or kV according to patient size and/or use of iterative reconstruction technique. CONTRAST:  75mL OMNIPAQUE IOHEXOL 350 MG/ML SOLN COMPARISON:  12/19/2022 FINDINGS: Lower Chest: No acute findings. Hepatobiliary:  No suspicious hepatic masses identified. Pancreas:  No mass or inflammatory changes. Spleen: Within normal limits in size and appearance.  Adrenals/Urinary Tract: No suspicious masses identified. No evidence of ureteral calculi or hydronephrosis. Delayed images show no evidence contrast extravasation from the ureters or urinary bladder. Stomach/Bowel: No evidence of obstruction, inflammatory process or abnormal fluid collections. Vascular/Lymphatic: No pathologically enlarged lymph nodes. No acute vascular findings. Reproductive: Prior hysterectomy again noted. High attenuation collection in the central pelvis shows mild decrease in size since previous study, currently measuring 9.4 x 4.0 cm compared to 9.9 x 6.2 cm previously. This is consistent with resolving postop hematoma. Other:  None. Musculoskeletal:  No suspicious bone lesions identified. IMPRESSION: Mild decrease in size of postop hematoma in the hysterectomy bed. No other acute findings or complication identified. Electronically Signed   By: Danae Orleans M.D.   On: 12/22/2022 16:57   CT ABDOMEN PELVIS W CONTRAST  Result Date: 12/19/2022 CLINICAL DATA:  Postoperative abdominal pain, emesis, recent hysterectomy EXAM: CT ABDOMEN AND PELVIS WITH CONTRAST TECHNIQUE: Multidetector CT imaging of the abdomen and pelvis was performed using the standard protocol following bolus administration of intravenous contrast. RADIATION DOSE REDUCTION: This exam was performed according to the departmental dose-optimization program which includes automated exposure control, adjustment of the mA and/or kV according to patient size and/or use of iterative reconstruction technique. CONTRAST:  75mL OMNIPAQUE IOHEXOL 350 MG/ML SOLN COMPARISON:  09/23/2021 FINDINGS: Lower chest: No acute pleural or parenchymal lung disease. Hepatobiliary: No focal liver abnormality is seen. No gallstones, gallbladder wall thickening, or biliary dilatation. Pancreas: Unremarkable. No pancreatic ductal dilatation or surrounding inflammatory changes. Spleen: Normal in size without focal abnormality. Adrenals/Urinary Tract: The  adrenals are unremarkable. Kidneys enhance normally and symmetrically. No urinary tract calculi or obstructive uropathy. Bladder is unremarkable. Stomach/Bowel: No bowel obstruction or ileus. No bowel wall thickening or inflammatory change. Normal appendix. Vascular/Lymphatic: No significant vascular findings are present. No enlarged abdominal or pelvic lymph nodes. Reproductive: Postsurgical changes from recent vaginal hysterectomy. High attenuation fluid within the central pelvis measuring 9.9 x 6.2 cm consistent with residual blood products after surgery. No adnexal masses. Other: High attenuation pelvic free fluid as described above consistent with blood products after recent surgery. No free intraperitoneal gas. No abdominal wall hernia. Musculoskeletal: No acute or destructive bony abnormalities. Reconstructed images demonstrate no additional findings. IMPRESSION: 1. Moderate high attenuation free fluid within the lower pelvis, consistent with postoperative blood products after recent vaginal hysterectomy. 2. Otherwise unremarkable exam. Electronically Signed   By: Sharlet Salina M.D.   On: 12/19/2022 20:46    MAU Management/MDM: Orders Placed This Encounter  Procedures   CT ABDOMEN PELVIS W CONTRAST   CBC with Differential/Platelet   Comprehensive metabolic panel   Type and screen  [facility]   Saline lock IV    Meds ordered this encounter  Medications   HYDROmorphone (DILAUDID) injection 1 mg   ondansetron (ZOFRAN) injection 4 mg   iohexol (OMNIPAQUE) 350 MG/ML injection 75 mL   HYDROmorphone (DILAUDID) injection 1 mg   labetalol (NORMODYNE) injection 20 mg  Pt without acute abdomen on exam.  Exam of surgical site by Dr Berton Lan is wnl (see separate note).  CT scan without abnormalities.  Dilaudid, oxycodone and Toradol given without pain relief.  BPs elevated so Labetalol x 2 IV doses given, no chest pain or shortness of breath. Levsin and simethicone given.  Pt moaning  and writhing in pain in MAU.  Consult Dr Donavan Foil who came to MAU to see patient.    Sharen Counter Certified Nurse-Midwife 12/22/2022  5:16 PM   ADDITION TO ABOVE: I also examined the patient on 12/22/22. Abdomen was soft, non distended and non tender. Sterile speculum exam performed and cuff visibly intact with possible small clot at the right apex of her vaginal cuff. No erythema, no e/o cellulitis. Digital exam performed and cuff palpably intact with no tenderness. Remainder of care per Dr. Donavan Foil.   Harvie Bridge, MD Obstetrician & Gynecologist, [facility] for [employer], [facility]

## 2022-12-22 NOTE — Discharge Summary (Signed)
Gynecology Physician Discharge Summary  Patient ID: Holly Brown MRN: [id number] DOB/AGE: Sep 23, 1985 37 y.o.  Admit Date: 12/19/2022 Discharge Date: 12/22/2022  Admission Diagnoses:  Post-operative nausea and vomiting [R11.2, Z98.890] Post-operative hematoma  Discharge Diagnoses: As above  Hospital Course:  Holly Brown is a 37 y.o. [id number] who is POD6 s/p TVH/BS for AUB who was admitted for intractable nausea/vomiting and abdominal pain.   Patient presented on POD3 with intractable nausea, vomiting and abdominal pain. Work up was revealed 9.9cm post op pelvic hematoma, mild AKI, and mild hypokalemia. She had no e/o ileus, bowel obstruction, urinary tract injury, or other acute intraabdominal pathology. She was admitted for symptom control and IV fluids.   During her admission, pt reported worsening epigastric pain and nausea/vomiting concerning for possible gastritis. NSAIDs were discontinued, PPI and GI cocktails were added. Her symptoms were controlled with scheduled tylenol and zofran/reglan prn.  On day of discharge, she reported her pain had largely resolved, she was tolerating a regular diet and passing flatus. She will follow up with Dr. Alysia Penna on 9/18.   Significant Labs:    Latest Ref Rng & Units 12/22/2022    3:22 PM 12/21/2022   10:32 AM 12/20/2022    5:11 AM  CBC  WBC 4.0 - 10.5 K/uL 17.0  11.5  13.6   Hemoglobin 12.0 - 15.0 g/dL 14.7  82.9  9.8   Hematocrit 36.0 - 46.0 % 34.8  30.9  29.1   Platelets 150 - 400 K/uL 508  358  407     Discharge Exam: Blood pressure 130/77, pulse 88, temperature 98.6 F (37 C), temperature source Oral, resp. rate 18, height 5\' 10"  (1.778 m), weight 108.9 kg, SpO2 100%. General appearance: alert and no distress  Resp: normal respiratory effort Cardio: regular rate  GI: soft, non-tender, non-distended Extremities: extremities normal, no sign of DVT  Discharged Condition: Stable  Disposition: Discharge disposition: 01-Home or Self  Care       Discharge Instructions     Call MD for:   Complete by: As directed    Heavy vaginal bleeding   Call MD for:  persistant nausea and vomiting   Complete by: As directed    Call MD for:  severe uncontrolled pain   Complete by: As directed    Call MD for:  temperature >100.4   Complete by: As directed    Diet general   Complete by: As directed    Driving Restrictions   Complete by: As directed    Do not lift while taking opioid pain medication   Increase activity slowly   Complete by: As directed    Lifting restrictions   Complete by: As directed    Do not lift more than 15lbs until cleared by your surgeon   Sexual Activity Restrictions   Complete by: As directed    Avoid sexual activity until cleared by your surgeon      Allergies as of 12/22/2022   No Known Allergies      Medication List     STOP taking these medications    ibuprofen 800 MG tablet Commonly known as: ADVIL       TAKE these medications    alum & mag hydroxide-simeth 200-200-20 MG/5ML suspension Commonly known as: MAALOX/MYLANTA Take 30 mLs by mouth every 4 (four) hours as needed for indigestion.   HYDROmorphone 2 MG tablet Commonly known as: DILAUDID Take 1 tablet (2 mg total) by mouth every 4 (four) hours as needed for severe  pain.   HYDROmorphone 2 MG tablet Commonly known as: DILAUDID Take 1 tablet (2 mg total) by mouth every 4 (four) hours as needed for severe pain.   pantoprazole 40 MG tablet Commonly known as: PROTONIX Take 1 tablet (40 mg total) by mouth daily.   promethazine 25 MG tablet Commonly known as: PHENERGAN Take 1 tablet (25 mg total) by mouth every 6 (six) hours as needed for nausea or vomiting.   senna 8.6 MG Tabs tablet Commonly known as: SENOKOT Take 1 tablet (8.6 mg total) by mouth 2 (two) times daily.   simethicone 80 MG chewable tablet Commonly known as: MYLICON Chew 1 tablet (80 mg total) by mouth 4 (four) times daily as needed for  flatulence.       Future Appointments  Date Time Provider Department Center  01/07/2023  2:50 PM Hermina Staggers, MD [facility] None  03/12/2023  9:20 AM Mort Sawyers, FNP [facility]    Follow-up Information     [facility] for [facility] Follow up on 01/07/2023.   Specialty: Obstetrics and Gynecology Why: Please follow up with Dr. Alysia Penna as scheduled Contact information: [address], [address] [place] [address] [phone]                Total discharge time: 30 minutes   Signed: Harvie Bridge, MD Obstetrician & Gynecologist, [facility] for [facility], [facility]

## 2022-12-22 NOTE — Discharge Instructions (Signed)
Please make sure you take your protonix every day and your tylenol around the clock to prevent yourself from getting too behind on pain medicine.   Take the mylanta/maalox liquid when you're having that upper stomach pain  Keep taking your senna to prevent constipation

## 2022-12-22 NOTE — H&P (Signed)
[facility]st[facility]s [facility] Gynecology Attending History and Physical  Holly Brown is a 37 y.o. [id number]  who presented to MAU today for evaluation of continued postoperative pain. Pt was just discharged this afternoon after previous readmission for postoperative pelvic hematoma after TVH on 12/16/22.  Pt had increased pain after vomiting at home. Pt had been discharged originally with dilaudid which appears to be ineffective.  Pt was seen in the MAU after she had gotten pain medicine as well as GI meds.  Currently, the patient appears comfortable.  All labs and imaging were stable.  Pt was offered a different set of pain medication to go home or to again be kept for observation and pain control.  Pt has decided on readmission.  Past Medical History:  Diagnosis Date   Gestational diabetes    metformin - resolved after delivery   History of UTI    HSV (herpes simplex virus) infection    Migraines    Peripheral vascular disease (HCC)    Post-operative nausea and vomiting 12/19/2022   Seizures (HCC)    Jan.2016 - only had one seizure, none since   Vaginal Pap smear, abnormal    Past Surgical History:  Procedure Laterality Date   DILATION AND CURETTAGE OF UTERUS     LAPAROSCOPIC TUBAL LIGATION Bilateral 12/08/2018   Procedure: LAPAROSCOPIC TUBAL LIGATION;  Surgeon: Catalina Antigua, MD;  Location: [facility];  Service: Gynecology;  Laterality: Bilateral;   LEEP N/A 03/18/2022   Procedure: LOOP ELECTROSURGICAL EXCISION PROCEDURE (LEEP);  Surgeon: Catalina Antigua, MD;  Location: [facility];  Service: Gynecology;  Laterality: N/A;   VAGINAL HYSTERECTOMY N/A 12/16/2022   Procedure: TOTAL VAGINAL HYSTERECTOMY, BILATERAL SALPINGECTOMY;  Surgeon: Hermina Staggers, MD;  Location: [facility];  Service: Gynecology;  Laterality: N/A;   WISDOM TOOTH EXTRACTION  04/21/2009   OB History  Gravida Para Term Preterm AB Living  5 4 3 1 1 4   SAB IAB Ectopic Multiple Live Births  1     0 4    #  Outcome Date GA Lbr Len/2nd Weight Sex Type Anes PTL Lv  5 Term 08/31/18 [redacted]w[redacted]d 03:39 3164 g F Vag-Spont None  LIV  4 Term 11/08/12 [redacted]w[redacted]d  3532 g M Vag-Spont None  LIV     Birth Comments: No complications during pregnancy or delivery.  No special care nursery, went home with mother after 2 days.  3 Preterm 08/04/09 [redacted]w[redacted]d  3345 g M Vag-Spont  Y LIV  2 Term 10/02/08    F Vag-Spont   LIV  1 SAB           Patient denies any other pertinent gynecologic issues.  No current facility-administered medications on file prior to encounter.   Current Outpatient Medications on File Prior to Encounter  Medication Sig Dispense Refill   HYDROmorphone (DILAUDID) 2 MG tablet Take 1 tablet (2 mg total) by mouth every 4 (four) hours as needed for severe pain. 30 tablet 0   alum & mag hydroxide-simeth (MAALOX/MYLANTA) 200-200-20 MG/5ML suspension Take 30 mLs by mouth every 4 (four) hours as needed for indigestion. 355 mL 0   HYDROmorphone (DILAUDID) 2 MG tablet Take 1 tablet (2 mg total) by mouth every 4 (four) hours as needed for severe pain. 30 tablet 0   pantoprazole (PROTONIX) 40 MG tablet Take 1 tablet (40 mg total) by mouth daily. 30 tablet 0   promethazine (PHENERGAN) 25 MG tablet Take 1 tablet (25 mg total) by mouth every 6 (six)  hours as needed for nausea or vomiting. 15 tablet 0   senna (SENOKOT) 8.6 MG TABS tablet Take 1 tablet (8.6 mg total) by mouth 2 (two) times daily. 20 tablet 0   simethicone (MYLICON) 80 MG chewable tablet Chew 1 tablet (80 mg total) by mouth 4 (four) times daily as needed for flatulence. 100 tablet 0   No Known Allergies  Social History:   reports that she has quit smoking. Her smoking use included cigarettes. She started smoking about 11 years ago. She has a 2.8 pack-year smoking history. She has never been exposed to tobacco smoke. She has never used smokeless tobacco. She reports that she does not currently use alcohol. She reports that she does not currently use drugs after  having used the following drugs: Marijuana. Family History  Problem Relation Age of Onset   Diabetes Mother    Stroke Mother    Colon cancer Father 74   Hypertension Maternal Grandmother    Diabetes Maternal Grandmother    Hypertension Paternal Grandmother     Review of Systems: Pertinent items noted in HPI and remainder of comprehensive ROS otherwise negative.  PHYSICAL EXAM: Blood pressure 112/68, pulse 76, temperature 98.7 F (37.1 C), temperature source Oral, resp. rate 20, SpO2 100%. CONSTITUTIONAL: Well-developed, well-nourished female in no acute distress.  HENT:  Normocephalic, atraumatic, External right and left ear normal. Oropharynx is clear and moist EYES: Conjunctivae and EOM are normal.  NECK: Normal range of motion, supple, no masses SKIN: Skin is warm and dry. No rash noted. Not diaphoretic. No erythema. No pallor. NEUROLOGIC: Alert and oriented to person, place, and time. Normal reflexes, muscle tone coordination. No cranial nerve deficit noted. PSYCHIATRIC: Normal mood and affect. Normal behavior. Normal judgment and thought content. CARDIOVASCULAR: Normal heart rate noted, regular rhythm RESPIRATORY: Effort and breath sounds normal, no problems with respiration noted ABDOMEN: Soft, very mild tenderness in lower abdomen, no rebound or guarding, nondistended. PELVIC: Vaginal cuff previously evaluated by Dr. Berton Lan today was intact with no disruption. MUSCULOSKELETAL: Normal range of motion. No tenderness.  No cyanosis, clubbing, or edema.  2+ distal pulses.  Labs: Results for orders placed or performed during the hospital encounter of 12/22/22 (from the past 336 hour(s))  Type and screen [facility]   Collection Time: 12/22/22  3:20 PM  Result Value Ref Range   ABO/RH(D) A POS    Antibody Screen NEG    Sample Expiration      12/25/2022,2359 Performed at [facility], [address], [place], [place] [address]   CBC with  Differential/Platelet   Collection Time: 12/22/22  3:22 PM  Result Value Ref Range   WBC 17.0 (H) 4.0 - 10.5 K/uL   RBC 3.84 (L) 3.87 - 5.11 MIL/uL   Hemoglobin 11.6 (L) 12.0 - 15.0 g/dL   HCT 19.1 (L) 47.8 - 29.5 %   MCV 90.6 80.0 - 100.0 fL   MCH 30.2 26.0 - 34.0 pg   MCHC 33.3 30.0 - 36.0 g/dL   RDW 62.1 30.8 - 65.7 %   Platelets 508 (H) 150 - 400 K/uL   nRBC 0.0 0.0 - 0.2 %   Neutrophils Relative % 78 %   Neutro Abs 13.5 (H) 1.7 - 7.7 K/uL   Lymphocytes Relative 13 %   Lymphs Abs 2.2 0.7 - 4.0 K/uL   Monocytes Relative 7 %   Monocytes Absolute 1.1 (H) 0.1 - 1.0 K/uL   Eosinophils Relative 0 %   Eosinophils Absolute  0.0 0.0 - 0.5 K/uL   Basophils Relative 1 %   Basophils Absolute 0.1 0.0 - 0.1 K/uL   Immature Granulocytes 1 %   Abs Immature Granulocytes 0.08 (H) 0.00 - 0.07 K/uL  Comprehensive metabolic panel   Collection Time: 12/22/22  3:22 PM  Result Value Ref Range   Sodium 139 135 - 145 mmol/L   Potassium 3.0 (L) 3.5 - 5.1 mmol/L   Chloride 107 98 - 111 mmol/L   CO2 19 (L) 22 - 32 mmol/L   Glucose, Bld 132 (H) 70 - 99 mg/dL   BUN 5 (L) 6 - 20 mg/dL   Creatinine, Ser 1.61 (H) 0.44 - 1.00 mg/dL   Calcium 9.6 8.9 - 09.6 mg/dL   Total Protein 7.3 6.5 - 8.1 g/dL   Albumin 4.1 3.5 - 5.0 g/dL   AST 24 15 - 41 U/L   ALT 29 0 - 44 U/L   Alkaline Phosphatase 62 38 - 126 U/L   Total Bilirubin 2.2 (H) 0.3 - 1.2 mg/dL   GFR, Estimated >04 >54 mL/min   Anion gap 13 5 - 15  Urinalysis, Routine w reflex microscopic -Urine, Clean Catch   Collection Time: 12/22/22  8:59 PM  Result Value Ref Range   Color, Urine YELLOW YELLOW   APPearance CLEAR CLEAR   Specific Gravity, Urine 1.041 (H) 1.005 - 1.030   pH 7.0 5.0 - 8.0   Glucose, UA 150 (A) NEGATIVE mg/dL   Hgb urine dipstick SMALL (A) NEGATIVE   Bilirubin Urine NEGATIVE NEGATIVE   Ketones, ur 20 (A) NEGATIVE mg/dL   Protein, ur NEGATIVE NEGATIVE mg/dL   Nitrite NEGATIVE NEGATIVE   Leukocytes,Ua NEGATIVE NEGATIVE   RBC /  HPF 0-5 0 - 5 RBC/hpf   WBC, UA 0-5 0 - 5 WBC/hpf   Bacteria, UA MANY (A) NONE SEEN   Squamous Epithelial / HPF 0-5 0 - 5 /HPF   Mucus PRESENT   Results for orders placed or performed during the hospital encounter of 12/19/22 (from the past 336 hour(s))  Lipase, blood   Collection Time: 12/19/22  6:26 PM  Result Value Ref Range   Lipase 19 11 - 51 U/L  Comprehensive metabolic panel   Collection Time: 12/19/22  6:26 PM  Result Value Ref Range   Sodium 136 135 - 145 mmol/L   Potassium 3.0 (L) 3.5 - 5.1 mmol/L   Chloride 103 98 - 111 mmol/L   CO2 17 (L) 22 - 32 mmol/L   Glucose, Bld 161 (H) 70 - 99 mg/dL   BUN <5 (L) 6 - 20 mg/dL   Creatinine, Ser 0.98 (H) 0.44 - 1.00 mg/dL   Calcium 9.2 8.9 - 11.9 mg/dL   Total Protein 7.3 6.5 - 8.1 g/dL   Albumin 3.9 3.5 - 5.0 g/dL   AST 27 15 - 41 U/L   ALT 23 0 - 44 U/L   Alkaline Phosphatase 53 38 - 126 U/L   Total Bilirubin 2.0 (H) 0.3 - 1.2 mg/dL   GFR, Estimated >14 >78 mL/min   Anion gap 16 (H) 5 - 15  CBC   Collection Time: 12/19/22  6:26 PM  Result Value Ref Range   WBC 13.4 (H) 4.0 - 10.5 K/uL   RBC 3.50 (L) 3.87 - 5.11 MIL/uL   Hemoglobin 10.6 (L) 12.0 - 15.0 g/dL   HCT 29.5 (L) 62.1 - 30.8 %   MCV 92.0 80.0 - 100.0 fL   MCH 30.3 26.0 - 34.0 pg  MCHC 32.9 30.0 - 36.0 g/dL   RDW 16.1 09.6 - 04.5 %   Platelets 446 (H) 150 - 400 K/uL   nRBC 0.0 0.0 - 0.2 %  hCG, serum, qualitative   Collection Time: 12/19/22  6:26 PM  Result Value Ref Range   Preg, Serum NEGATIVE NEGATIVE  Urinalysis, Routine w reflex microscopic -Urine, Clean Catch   Collection Time: 12/19/22  9:05 PM  Result Value Ref Range   Color, Urine YELLOW YELLOW   APPearance HAZY (A) CLEAR   Specific Gravity, Urine 1.023 1.005 - 1.030   pH 9.0 (H) 5.0 - 8.0   Glucose, UA 150 (A) NEGATIVE mg/dL   Hgb urine dipstick MODERATE (A) NEGATIVE   Bilirubin Urine NEGATIVE NEGATIVE   Ketones, ur 20 (A) NEGATIVE mg/dL   Protein, ur NEGATIVE NEGATIVE mg/dL   Nitrite  NEGATIVE NEGATIVE   Leukocytes,Ua NEGATIVE NEGATIVE   RBC / HPF 0-5 0 - 5 RBC/hpf   WBC, UA 0-5 0 - 5 WBC/hpf   Bacteria, UA FEW (A) NONE SEEN   Squamous Epithelial / HPF 0-5 0 - 5 /HPF  Comprehensive metabolic panel   Collection Time: 12/20/22  5:11 AM  Result Value Ref Range   Sodium 136 135 - 145 mmol/L   Potassium 3.2 (L) 3.5 - 5.1 mmol/L   Chloride 102 98 - 111 mmol/L   CO2 19 (L) 22 - 32 mmol/L   Glucose, Bld 141 (H) 70 - 99 mg/dL   BUN <5 (L) 6 - 20 mg/dL   Creatinine, Ser 4.09 0.44 - 1.00 mg/dL   Calcium 9.3 8.9 - 81.1 mg/dL   Total Protein 6.8 6.5 - 8.1 g/dL   Albumin 3.5 3.5 - 5.0 g/dL   AST 30 15 - 41 U/L   ALT 30 0 - 44 U/L   Alkaline Phosphatase 50 38 - 126 U/L   Total Bilirubin 1.7 (H) 0.3 - 1.2 mg/dL   GFR, Estimated >91 >47 mL/min   Anion gap 15 5 - 15  CBC   Collection Time: 12/20/22  5:11 AM  Result Value Ref Range   WBC 13.6 (H) 4.0 - 10.5 K/uL   RBC 3.27 (L) 3.87 - 5.11 MIL/uL   Hemoglobin 9.8 (L) 12.0 - 15.0 g/dL   HCT 82.9 (L) 56.2 - 13.0 %   MCV 89.0 80.0 - 100.0 fL   MCH 30.0 26.0 - 34.0 pg   MCHC 33.7 30.0 - 36.0 g/dL   RDW 86.5 78.4 - 69.6 %   Platelets 407 (H) 150 - 400 K/uL   nRBC 0.0 0.0 - 0.2 %  Type and screen [facility]   Collection Time: 12/20/22  6:56 AM  Result Value Ref Range   ABO/RH(D) A POS    Antibody Screen NEG    Sample Expiration      12/22/2022,2359 Performed at [facility], [address], [place], [place] [address]   CBC   Collection Time: 12/21/22 10:32 AM  Result Value Ref Range   WBC 11.5 (H) 4.0 - 10.5 K/uL   RBC 3.47 (L) 3.87 - 5.11 MIL/uL   Hemoglobin 10.3 (L) 12.0 - 15.0 g/dL   HCT 41.3 (L) 24.4 - 01.0 %   MCV 89.0 80.0 - 100.0 fL   MCH 29.7 26.0 - 34.0 pg   MCHC 33.3 30.0 - 36.0 g/dL   RDW 27.2 53.6 - 64.4 %   Platelets 358 150 - 400 K/uL   nRBC 0.0 0.0 - 0.2 %  Basic metabolic panel   Collection Time: 12/21/22 10:32 AM  Result Value Ref Range   Sodium 136 135 - 145 mmol/L    Potassium 3.4 (L) 3.5 - 5.1 mmol/L   Chloride 104 98 - 111 mmol/L   CO2 18 (L) 22 - 32 mmol/L   Glucose, Bld 89 70 - 99 mg/dL   BUN <5 (L) 6 - 20 mg/dL   Creatinine, Ser 6.57 0.44 - 1.00 mg/dL   Calcium 9.2 8.9 - 84.6 mg/dL   GFR, Estimated >96 >29 mL/min   Anion gap 14 5 - 15  Results for orders placed or performed during the hospital encounter of 12/16/22 (from the past 336 hour(s))  Pregnancy, urine POC   Collection Time: 12/16/22  7:44 AM  Result Value Ref Range   Preg Test, Ur NEGATIVE NEGATIVE  Surgical pcr screen   Collection Time: 12/16/22  9:25 AM   Specimen: Nasal Mucosa; Nasal Swab  Result Value Ref Range   MRSA, PCR NEGATIVE NEGATIVE   Staphylococcus aureus NEGATIVE NEGATIVE  Surgical pathology   Collection Time: 12/16/22  9:25 AM  Result Value Ref Range   SURGICAL PATHOLOGY      SURGICAL PATHOLOGY CASE: [id number] PATIENT: Holly Brown Surgical Pathology Report     Clinical History: AUB (cm)     FINAL MICROSCOPIC DIAGNOSIS:  A. UTERUS AND CERVIX, WITH BILATERAL FALLOPIAN TUBES, HYSTERECTOMY: - Invasive moderately differentiated squamous cell carcinoma, see comment - Carcinoma invades for a depth of about 0.5 cm - No evidence of lymphovascular invasion - Resection margins are negative for carcinoma - Uterus with benign weakly proliferative endometrium - Adenomyosis - Benign unremarkable bilateral fallopian tubes - See oncology table      COMMENT:  Dr. Kenyon Ana reviewed the case and concurs with the above diagnosis. Dr. Alysia Penna was notified on 12/18/2022.    ONCOLOGY TABLE:  UTERINE CERVIX, CARCINOMA: Resection  Procedure: Total hysterectomy and bilateral salpingectomy Tumor Size: Carcinoma measures 1.1 cm on HE glass slide Histologic Type: Invasive squamous cell carcinoma Histologic Grade: G2, moderately differenti ated Stromal Invasion: Present      Depth of stromal invasion (mm): Just over 5 mm Lymphatic and/or Vascular Invasion:  Not identified Other Tissue/ Organ: Not applicable Margins: All margins negative for invasive carcinoma Margin Status for HSIL or AIS: All margins negative for HSIL or AIS Regional Lymph Nodes: Not applicable (no lymph nodes submitted or found)  Distant Metastasis:      Distant sites involved: Not applicable Pathologic Stage Classification (pTNM, AJCC 8th Edition): pT1b1, pN not assigned Ancillary Studies: Can be performed upon request Representative Tumor Block: A9 and A12  (v5.0.1.2)     GROSS DESCRIPTION:  Specimen: Received fresh labeled uterus with cervix, bilateral fallopian tubes Specimen integrity: A partially morcellated uterus and cervix with detached, unoriented fallopian tubes. Size and shape: 12.0 x 9.4 x 4.3 cm in aggregate Weight: 138 g Serosa: Intact serosa is tan-pink, smooth, hyperemic Cervix: Ectocervix is disrupted, tan -pink, glistening, measures approximately 3.5 x 3.4 cm with a presumed 0.6 cm cervical os.  The intact portion of endocervical canal measures 2.0 cm in length, and is tan-red and corrugated.  Cervical stroma is tan-white and fibrous, and the cervix is radially sectioned and the intact endocervical canal is entirely submitted. Endometrium: Small portion of possible endometrium is identified, and is tan-red, glistening, and measures up to 0.2 cm in thickness. Myometrium: Tan-pink and trabeculated, and no intramural lesions are grossly identified Bilateral fallopian tubes: Shorter segment of fimbriated fallopian tube  measures 2.0 cm in length by 0.6 cm in diameter.  Serosal surface is tan-pink and smooth.  Sectioning reveals a tan-pink cut surface with a pinpoint lumen.  Longer disrupted segment of fallopian tube measures 3.3 cm in length and up to 0.8 cm in diameter.  Serosal surfaces tan-pink, hyperemic, the specimen appears to display 2 blunted ends, grossly suggestive  of previous tubal ligation.  Sectioning reveals a tan cut surface  with a moderately dilated lumen, filled with tan serous fluid. Block Summary: 16 block submitted 1-3 = possible endometrium 4 and 5 = myometrium and serosa 6 = shorter fallopian tube 7 = longer fallopian tube 8-16 = cervix  Lovey Newcomer 12/16/2022)  Final Diagnosis performed by Holley Bouche, MD.   Electronically signed 12/18/2022 Technical component performed at [facility], [address], [place], [place] [address].  Professional component performed at [facility], [address], [place], [place] [address].  Immunohistochemistry Technical component (if applicable) was performed at [facility]. [address], [address], [place], [place] [address].   IMMUNOHISTOCHEMISTRY DISCLAIMER (if applicable): Some of these immunohistochemical stains may have been developed and the performance characteristics determine by [facility]. Some may not ha ve been cleared or approved by the U.S. Food and Drug Administration. The FDA has determined that such clearance or approval is not necessary. This test is used for clinical purposes. It should not be regarded as investigational or for research. This laboratory is certified under the Clinical Laboratory Improvement Amendments of 1988 (CLIA-88) as qualified to perform high complexity clinical laboratory testing.  The controls stained appropriately.   IHC stains are performed on formalin fixed, paraffin embedded tissue using a 3,3"diaminobenzidine (DAB) chromogen and Leica Bond Autostainer System. The staining intensity of the nucleus is score manually and is reported as the percentage of tumor cell nuclei demonstrating specific nuclear staining. The specimens are fixed in 10% Neutral Formalin for at least 6 hours and up to 72hrs. These tests are validated on decalcified tissue. Results should be interpreted with caution given the possibility of false negative results on de calcified  specimens. Antibody Clones are as follows ER-clone [id number], PR-clone 16, Ki67- clone [id number]. Some of these immunohistochemical stains may have been developed and the performance characteristics determined by [facility].   CBC   Collection Time: 12/17/22  2:47 PM  Result Value Ref Range   WBC 9.9 4.0 - 10.5 K/uL   RBC 3.26 (L) 3.87 - 5.11 MIL/uL   Hemoglobin 9.7 (L) 12.0 - 15.0 g/dL   HCT 13.0 (L) 86.5 - 78.4 %   MCV 89.6 80.0 - 100.0 fL   MCH 29.8 26.0 - 34.0 pg   MCHC 33.2 30.0 - 36.0 g/dL   RDW 69.6 29.5 - 28.4 %   Platelets 376 150 - 400 K/uL   nRBC 0.0 0.0 - 0.2 %  Basic metabolic panel   Collection Time: 12/17/22  2:47 PM  Result Value Ref Range   Sodium 135 135 - 145 mmol/L   Potassium 3.3 (L) 3.5 - 5.1 mmol/L   Chloride 103 98 - 111 mmol/L   CO2 24 22 - 32 mmol/L   Glucose, Bld 114 (H) 70 - 99 mg/dL   BUN 6 6 - 20 mg/dL   Creatinine, Ser 1.32 (H) 0.44 - 1.00 mg/dL   Calcium 8.7 (L) 8.9 - 10.3 mg/dL   GFR, Estimated >44 >01 mL/min   Anion gap 8 5 - 15  Results for orders  placed or performed in visit on 12/10/22 (from the past 336 hour(s))  Vitamin B12   Collection Time: 12/10/22 10:19 AM  Result Value Ref Range   Vitamin B-12 616 211 - 911 pg/mL    Imaging Studies: CT ABDOMEN PELVIS W CONTRAST  Result Date: 12/22/2022 CLINICAL DATA:  Postop abdominal pain.  Recent vaginal hysterectomy. EXAM: CT ABDOMEN AND PELVIS WITH CONTRAST TECHNIQUE: Multidetector CT imaging of the abdomen and pelvis was performed using the standard protocol following bolus administration of intravenous contrast. RADIATION DOSE REDUCTION: This exam was performed according to the departmental dose-optimization program which includes automated exposure control, adjustment of the mA and/or kV according to patient size and/or use of iterative reconstruction technique. CONTRAST:  75mL OMNIPAQUE IOHEXOL 350 MG/ML SOLN COMPARISON:  12/19/2022 FINDINGS: Lower Chest: No acute findings. Hepatobiliary:  No  suspicious hepatic masses identified. Pancreas:  No mass or inflammatory changes. Spleen: Within normal limits in size and appearance. Adrenals/Urinary Tract: No suspicious masses identified. No evidence of ureteral calculi or hydronephrosis. Delayed images show no evidence contrast extravasation from the ureters or urinary bladder. Stomach/Bowel: No evidence of obstruction, inflammatory process or abnormal fluid collections. Vascular/Lymphatic: No pathologically enlarged lymph nodes. No acute vascular findings. Reproductive: Prior hysterectomy again noted. High attenuation collection in the central pelvis shows mild decrease in size since previous study, currently measuring 9.4 x 4.0 cm compared to 9.9 x 6.2 cm previously. This is consistent with resolving postop hematoma. Other:  None. Musculoskeletal:  No suspicious bone lesions identified. IMPRESSION: Mild decrease in size of postop hematoma in the hysterectomy bed. No other acute findings or complication identified. Electronically Signed   By: Danae Orleans M.D.   On: 12/22/2022 16:57   CT ABDOMEN PELVIS W CONTRAST  Result Date: 12/19/2022 CLINICAL DATA:  Postoperative abdominal pain, emesis, recent hysterectomy EXAM: CT ABDOMEN AND PELVIS WITH CONTRAST TECHNIQUE: Multidetector CT imaging of the abdomen and pelvis was performed using the standard protocol following bolus administration of intravenous contrast. RADIATION DOSE REDUCTION: This exam was performed according to the departmental dose-optimization program which includes automated exposure control, adjustment of the mA and/or kV according to patient size and/or use of iterative reconstruction technique. CONTRAST:  75mL OMNIPAQUE IOHEXOL 350 MG/ML SOLN COMPARISON:  09/23/2021 FINDINGS: Lower chest: No acute pleural or parenchymal lung disease. Hepatobiliary: No focal liver abnormality is seen. No gallstones, gallbladder wall thickening, or biliary dilatation. Pancreas: Unremarkable. No pancreatic  ductal dilatation or surrounding inflammatory changes. Spleen: Normal in size without focal abnormality. Adrenals/Urinary Tract: The adrenals are unremarkable. Kidneys enhance normally and symmetrically. No urinary tract calculi or obstructive uropathy. Bladder is unremarkable. Stomach/Bowel: No bowel obstruction or ileus. No bowel wall thickening or inflammatory change. Normal appendix. Vascular/Lymphatic: No significant vascular findings are present. No enlarged abdominal or pelvic lymph nodes. Reproductive: Postsurgical changes from recent vaginal hysterectomy. High attenuation fluid within the central pelvis measuring 9.9 x 6.2 cm consistent with residual blood products after surgery. No adnexal masses. Other: High attenuation pelvic free fluid as described above consistent with blood products after recent surgery. No free intraperitoneal gas. No abdominal wall hernia. Musculoskeletal: No acute or destructive bony abnormalities. Reconstructed images demonstrate no additional findings. IMPRESSION: 1. Moderate high attenuation free fluid within the lower pelvis, consistent with postoperative blood products after recent vaginal hysterectomy. 2. Otherwise unremarkable exam. Electronically Signed   By: Sharlet Salina M.D.   On: 12/19/2022 20:46    Assessment: Principal Problem:   Postoperative pain   Plan: Admit to Women's Unit Discontinue  dilaudid.  Start po oxycodone and zofran. Senokot and dulcolax suppository added to aid in continued bowel movements. If pain well controlled, plan on d/c home on 12/23/22 Routine floor care    Mariel Aloe, MD, FACOG Obstetrician & Gynecologist, [facility], [facility]

## 2022-12-23 ENCOUNTER — Other Ambulatory Visit (HOSPITAL_COMMUNITY): Payer: Self-pay

## 2022-12-23 ENCOUNTER — Other Ambulatory Visit: Payer: Self-pay

## 2022-12-23 ENCOUNTER — Telehealth: Payer: Self-pay | Admitting: Family

## 2022-12-23 ENCOUNTER — Other Ambulatory Visit: Payer: Self-pay | Admitting: Obstetrics and Gynecology

## 2022-12-23 DIAGNOSIS — Z823 Family history of stroke: Secondary | ICD-10-CM | POA: Diagnosis not present

## 2022-12-23 DIAGNOSIS — G8918 Other acute postprocedural pain: Secondary | ICD-10-CM | POA: Diagnosis present

## 2022-12-23 DIAGNOSIS — N9489 Other specified conditions associated with female genital organs and menstrual cycle: Secondary | ICD-10-CM | POA: Diagnosis present

## 2022-12-23 DIAGNOSIS — Z87891 Personal history of nicotine dependence: Secondary | ICD-10-CM | POA: Diagnosis not present

## 2022-12-23 DIAGNOSIS — R1012 Left upper quadrant pain: Secondary | ICD-10-CM | POA: Diagnosis present

## 2022-12-23 DIAGNOSIS — K269 Duodenal ulcer, unspecified as acute or chronic, without hemorrhage or perforation: Secondary | ICD-10-CM

## 2022-12-23 DIAGNOSIS — C539 Malignant neoplasm of cervix uteri, unspecified: Secondary | ICD-10-CM

## 2022-12-23 DIAGNOSIS — K449 Diaphragmatic hernia without obstruction or gangrene: Secondary | ICD-10-CM | POA: Diagnosis not present

## 2022-12-23 DIAGNOSIS — D649 Anemia, unspecified: Secondary | ICD-10-CM | POA: Diagnosis present

## 2022-12-23 DIAGNOSIS — R112 Nausea with vomiting, unspecified: Secondary | ICD-10-CM

## 2022-12-23 DIAGNOSIS — Z800 Family history of malignant neoplasm of digestive organs: Secondary | ICD-10-CM | POA: Diagnosis not present

## 2022-12-23 DIAGNOSIS — Z87440 Personal history of urinary (tract) infections: Secondary | ICD-10-CM | POA: Diagnosis not present

## 2022-12-23 DIAGNOSIS — Z90710 Acquired absence of both cervix and uterus: Secondary | ICD-10-CM | POA: Diagnosis not present

## 2022-12-23 DIAGNOSIS — E876 Hypokalemia: Secondary | ICD-10-CM | POA: Diagnosis present

## 2022-12-23 DIAGNOSIS — Z833 Family history of diabetes mellitus: Secondary | ICD-10-CM | POA: Diagnosis not present

## 2022-12-23 DIAGNOSIS — K259 Gastric ulcer, unspecified as acute or chronic, without hemorrhage or perforation: Secondary | ICD-10-CM | POA: Diagnosis present

## 2022-12-23 LAB — HEPATITIS PANEL, ACUTE
HCV Ab: NONREACTIVE
Hep A IgM: NONREACTIVE
Hep B C IgM: NONREACTIVE
Hepatitis B Surface Ag: NONREACTIVE

## 2022-12-23 LAB — BILIRUBIN, DIRECT: Bilirubin, Direct: 0.3 mg/dL — ABNORMAL HIGH (ref 0.0–0.2)

## 2022-12-23 LAB — TYPE AND SCREEN
ABO/RH(D): A POS
Antibody Screen: NEGATIVE

## 2022-12-23 LAB — AMYLASE: Amylase: 55 U/L (ref 28–100)

## 2022-12-23 MED ORDER — PROMETHAZINE HCL 25 MG PO TABS: 12.5000 mg | ORAL_TABLET | Freq: Four times a day (QID) | ORAL | Status: DC | PRN

## 2022-12-23 MED ORDER — HYDROMORPHONE HCL 1 MG/ML IJ SOLN
1.0000 mg | Freq: Once | INTRAMUSCULAR | Status: AC
  Administered 2022-12-23: 1 mg via INTRAVENOUS
  Filled 2022-12-23: qty 1

## 2022-12-23 MED ORDER — POTASSIUM CHLORIDE 10 MEQ/100ML IV SOLN
10.0000 meq | INTRAVENOUS | Status: AC
  Administered 2022-12-23 (×4): 10 meq via INTRAVENOUS
  Filled 2022-12-23 (×4): qty 100

## 2022-12-23 MED ORDER — SODIUM CHLORIDE 0.9 % IV BOLUS
500.0000 mL | Freq: Once | INTRAVENOUS | Status: AC
  Administered 2022-12-23: 500 mL via INTRAVENOUS

## 2022-12-23 MED ORDER — KETOROLAC TROMETHAMINE 30 MG/ML IJ SOLN
15.0000 mg | Freq: Four times a day (QID) | INTRAMUSCULAR | Status: DC
  Administered 2022-12-23: 15 mg via INTRAVENOUS
  Filled 2022-12-23: qty 1

## 2022-12-23 MED ORDER — PROMETHAZINE HCL 12.5 MG RE SUPP: 12.5000 mg | Freq: Four times a day (QID) | RECTAL | Status: DC | PRN

## 2022-12-23 MED ORDER — OXYCODONE HCL 5 MG PO TABS
10.0000 mg | ORAL_TABLET | ORAL | Status: DC | PRN
  Administered 2022-12-23 – 2022-12-25 (×6): 10 mg via ORAL
  Filled 2022-12-23 (×8): qty 2

## 2022-12-23 MED ORDER — SCOPOLAMINE 1 MG/3DAYS TD PT72
1.0000 | MEDICATED_PATCH | TRANSDERMAL | Status: DC
  Administered 2022-12-23: 1.5 mg via TRANSDERMAL
  Filled 2022-12-23 (×2): qty 1

## 2022-12-23 MED ORDER — PANTOPRAZOLE SODIUM 40 MG PO TBEC
40.0000 mg | DELAYED_RELEASE_TABLET | Freq: Two times a day (BID) | ORAL | Status: DC
  Administered 2022-12-23: 40 mg via ORAL
  Filled 2022-12-23 (×2): qty 1

## 2022-12-23 NOTE — Progress Notes (Signed)
Events of pt's admissions over the holiday weekend noted and reviewed in the chart as well with the pt  Presently pt has no complaints except for some mild pain/soreness. She is tolerating diet. + Flatus. Last BM this past Sat  No N/V since early this morning Up to restroom without problems  No vaginal bleeding  PE AF VSS Lungs clear Heart RRR Abd soft + BS no epigastic tenderness GU deferred Ext non tender   A/P POD # 7 TVH        Post op N/V and pain        Pelvic hematoma        Hypokalemia        Stage 1 cervical cancer  Cervical Cancer discussed and reviewed with pt. Referral to GYN Onc as outpt for further evaluation and management as indicated reviewed  Replacing K. F/U BMP in AM  Hematoma stable and resolving. H & H stable.   Uncertain as to etiology of pt's N/V. It appears that her acute pain follows episodes of N/V. Would expect this following vaginal surgery. Appreciate GI see pt. Labs as per their recommendations have been ordered. Pain appears to be better management with Toradol. Discussed post op pain expectations with pt and family. Scop patch for N/V. Continue with protonics.    Will continue to observe over night. Hopefully will be able to discharge home tomorrow.

## 2022-12-23 NOTE — Telephone Encounter (Signed)
Spoke with pt. States that she had a hysterectomy last week. Has been having abdominal pain since being discharged on 12/18/2022. Pt has been examined and tests have been done and nothing is showing that the hysterectomy is the cause of the pain. She has also been vomiting a lot and the doctors believe that the abdominal pain could be coming from all of the vomiting that she has been doing. Pt is currently in the hospital being seen due to the vomiting and abdominal pain. Pt just wanted Holly Brown to be aware of what was going on with her.

## 2022-12-23 NOTE — Consult Note (Addendum)
Referring Provider: Dr. Nettie Elm Primary Care Physician:  Mort Sawyers, FNP Primary Gastroenterologist:  Dr. Erick Blinks  Reason for Consultation:  N/V and abdominal pain   HPI: Holly Brown is a 37 y.o. female with a past medical history of seizure 04/2014, migraine headaches, gestational diabetes and menorrhagia with uterine fibroids. She underwent a total vaginal hysterectomy with bilateral salpingectomy by Dr. Alysia Penna 12/16/2022. She had postop nausea and vomiting which improved after receiving antiemetics. She was tolerating a solid diet, voiding without difficulty and passing gas per the rectum and was discharged home 12/18/2022. She endorsed having persistent nausea and vomiting, could not keep anything down with LUQ pain therefore she presented to the ED 12/19/2022.  Labs in the ED showed a WBC count of 13.4.  Hemoglobin 10.6 up from 9.7.  Potassium 3.2.  BUN < 5.  Creatinine 0.99.  Total bili 1.7.  Alk phos 50.  AST 30.  ALT 30. CTAP with contrast showed high attenuation fluid within the central pelvis measuring 9.9 x 6.2 cm consistent with residual blood products post hysterectomy.  She received IV fluids, antiemetics and potassium replacement. Her clinical status stabilized and she was discharged home 12/22/2018 on Pantoprazole 40 mg daily, Promethazine 25 mg every 6 hours and Hydromorphone 2 mg every 4 hours as needed.  She continued to have intractable nausea and vomiting with LUQ pain x 2 hours. She returned back to the ED yesterday evening.  Labs in the ED showed a WBC 17.  Hemoglobin 11.6.  Potassium 3.0.  BUN 5.  Creatinine 1.16.  Total bili 2.2.  Alk phos 62.  AST 24.  ALT 29.  Urinalysis positive for ketones and a small amount of hemoglobin with negative nitrite/leukocytes.  CTAP with contrast showed mild decrease in the size of postop hematoma in the hysterectomy bed without acute intra-abdominal pathology to explain her symptoms. A GI consult was requested for further evaluation  regarding nausea, vomiting and abdominal pain.  Her N/V and LUQ pain are controlled at this time.  Emesis described as clear to bilious and nonbloody.  She has infrequent heartburn.  No history of ulcers or gallstones. Infrequent NSAID use. She denies having any LUQ pain prior to her hysterectomy surgery.  She typically passes a normal brown bowel movement most days, no rectal bleeding or black stools.  Last bowel movement was days ago.  Infrequent alcohol intake.  She smokes marijuana once to twice weekly for many years.  She has a history of acute onset N/V and diarrhea with LLQ pain 09/23/2021.  CTAP with contrast done at [facility] ED showed a long segment of mild colonic wall thickening involving the descending and distal transverse colon suggestive of mild colitis.  She was prescribed a course of Cipro and Flagyl and was discharged home.  She underwent a screening colonoscopy 04/24/2016 due to father's history of colon cancer diagnosed in his early 16s which resulted in a poor prep with stool in the rectosigmoid and rectum.  She was advised to repeat a colonoscopy with a 2-day bowel prep which was not done.  GI PROCEDURES:  Colonoscopy 04/24/2016: - Preparation of the colon was unsatisfactory. - Stool in the rectum and in the recto-sigmoid colon. - No specimens collected.  Past Medical History:  Diagnosis Date   Gestational diabetes    metformin - resolved after delivery   History of UTI    HSV (herpes simplex virus) infection    Migraines    Peripheral vascular disease (HCC)  Post-operative nausea and vomiting 12/19/2022   Seizures (HCC)    Jan.2016 - only had one seizure, none since   Vaginal Pap smear, abnormal     Past Surgical History:  Procedure Laterality Date   DILATION AND CURETTAGE OF UTERUS     LAPAROSCOPIC TUBAL LIGATION Bilateral 12/08/2018   Procedure: LAPAROSCOPIC TUBAL LIGATION;  Surgeon: Catalina Antigua, MD;  Location: [facility];   Service: Gynecology;  Laterality: Bilateral;   LEEP N/A 03/18/2022   Procedure: LOOP ELECTROSURGICAL EXCISION PROCEDURE (LEEP);  Surgeon: Catalina Antigua, MD;  Location: [facility] OR;  Service: Gynecology;  Laterality: N/A;   VAGINAL HYSTERECTOMY N/A 12/16/2022   Procedure: TOTAL VAGINAL HYSTERECTOMY, BILATERAL SALPINGECTOMY;  Surgeon: Hermina Staggers, MD;  Location: [facility] OR;  Service: Gynecology;  Laterality: N/A;   WISDOM TOOTH EXTRACTION  04/21/2009    Prior to Admission medications   Medication Sig Start Date End Date Taking? Authorizing Provider  HYDROmorphone (DILAUDID) 2 MG tablet Take 1 tablet (2 mg total) by mouth every 4 (four) hours as needed for severe pain. 12/18/22  Yes Hermina Staggers, MD  alum & mag hydroxide-simeth (MAALOX/MYLANTA) 200-200-20 MG/5ML suspension Take 30 mLs by mouth every 4 (four) hours as needed for indigestion. 12/22/22   Lennart Pall, MD  HYDROmorphone (DILAUDID) 2 MG tablet Take 1 tablet (2 mg total) by mouth every 4 (four) hours as needed for severe pain. 12/18/22   Hermina Staggers, MD  pantoprazole (PROTONIX) 40 MG tablet Take 1 tablet (40 mg total) by mouth daily. 12/22/22   Lennart Pall, MD  promethazine (PHENERGAN) 25 MG tablet Take 1 tablet (25 mg total) by mouth every 6 (six) hours as needed for nausea or vomiting. 12/18/22   Hermina Staggers, MD  senna (SENOKOT) 8.6 MG TABS tablet Take 1 tablet (8.6 mg total) by mouth 2 (two) times daily. 12/18/22   Hermina Staggers, MD  simethicone (MYLICON) 80 MG chewable tablet Chew 1 tablet (80 mg total) by mouth 4 (four) times daily as needed for flatulence. 12/18/22   Hermina Staggers, MD    Current Facility-Administered Medications  Medication Dose Route Frequency Provider Last Rate Last Admin   alum & mag hydroxide-simeth (MAALOX/MYLANTA) 200-200-20 MG/5ML suspension 30 mL  30 mL Oral Q4H PRN Warden Fillers, MD       bisacodyl (DULCOLAX) suppository 10 mg  10 mg Rectal Daily PRN Warden Fillers, MD        ketorolac (TORADOL) 30 MG/ML injection 15 mg  15 mg Intravenous Q6H Hermina Staggers, MD   15 mg at 12/23/22 0915   ondansetron (ZOFRAN) tablet 4 mg  4 mg Oral Q6H PRN Warden Fillers, MD   4 mg at 12/22/22 2349   Or   ondansetron (ZOFRAN) injection 4 mg  4 mg Intravenous Q6H PRN Warden Fillers, MD       oxyCODONE (Oxy IR/ROXICODONE) immediate release tablet 10 mg  10 mg Oral Q4H PRN Warden Fillers, MD       pantoprazole (PROTONIX) EC tablet 40 mg  40 mg Oral Daily Warden Fillers, MD   40 mg at 12/23/22 0915   potassium chloride 10 mEq in 100 mL IVPB  10 mEq Intravenous Q1 Hr x 4 Warden Fillers, MD 100 mL/hr at 12/23/22 0913 10 mEq at 12/23/22 0913   scopolamine (TRANSDERM-SCOP) 1 MG/3DAYS 1.5 mg  1 patch Transdermal Q72H Hermina Staggers, MD   1.5 mg at 12/23/22 0915  senna (SENOKOT) tablet 8.6 mg  1 tablet Oral BID Warden Fillers, MD   8.6 mg at 12/23/22 0915    Allergies as of 12/22/2022   (No Known Allergies)    Family History  Problem Relation Age of Onset   Diabetes Mother    Stroke Mother    Colon cancer Father 39   Hypertension Maternal Grandmother    Diabetes Maternal Grandmother    Hypertension Paternal Grandmother     Social History   Socioeconomic History   Marital status: Single    Spouse name: Not on file   Number of children: 3   Years of education: college   Highest education level: Not on file  Occupational History    Employer: [employer]    Comment: service desk analyst  Tobacco Use   Smoking status: Former    Current packs/day: 0.25    Average packs/day: 0.3 packs/day for 11.0 years (2.8 ttl pk-yrs)    Types: Cigarettes    Start date: 12/21/2011    Passive exposure: Never   Smokeless tobacco: Never  Vaping Use   Vaping status: Never Used  Substance and Sexual Activity   Alcohol use: Not Currently    Comment: ocassionally    Drug use: Not Currently    Types: Marijuana    Comment: Last use was in 2023   Sexual activity: Not Currently     Partners: Male    Birth control/protection: Surgical    Comment: Tubal Ligation  Other Topics Concern   Not on file  Social History Narrative   Lives with her grandmother and her three children   Does not drink caffeine    Social Determinants of Health   Financial Resource Strain: Low Risk  (02/01/2018)   Overall Financial Resource Strain (CARDIA)    Difficulty of Paying Living Expenses: Not hard at all  Food Insecurity: No Food Insecurity (12/22/2022)   Hunger Vital Sign    Worried About Running Out of Food in the Last Year: Never true    Ran Out of Food in the Last Year: Never true  Transportation Needs: No Transportation Needs (12/22/2022)   PRAPARE - Administrator, Civil Service (Medical): No    Lack of Transportation (Non-Medical): No  Physical Activity: Not on file  Stress: No Stress Concern Present (08/17/2018)   [employer] of Occupational Health - Occupational Stress Questionnaire    Feeling of Stress : Not at all  Social Connections: Unknown (08/31/2018)   Social Connection and Isolation Panel [NHANES]    Frequency of Communication with Friends and Family: More than three times a week    Frequency of Social Gatherings with Friends and Family: More than three times a week    Attends Religious Services: Not on file    Active Member of Clubs or Organizations: Not on file    Attends Banker Meetings: Not on file    Marital Status: Not on file  Intimate Partner Violence: Not At Risk (12/22/2022)   Humiliation, Afraid, Rape, and Kick questionnaire    Fear of Current or Ex-Partner: No    Emotionally Abused: No    Physically Abused: No    Sexually Abused: No    Review of Systems: Gen: Denies fever, sweats or chills. No weight loss.  CV: Denies chest pain, palpitations or edema. Resp: Denies cough, shortness of breath of hemoptysis.  GI: See HPI. GU : Denies urinary burning, blood in urine, increased urinary frequency or incontinence. MS: Denies  joint pain, muscles aches or weakness. Derm: Denies rash, itchiness, skin lesions or unhealing ulcers. Psych: Denies depression, anxiety, memory loss or confusion. Heme: Denies easy bruising, bleeding. Neuro:  Denies headaches, dizziness or paresthesias. Endo:  Denies any problems with DM, thyroid or adrenal function.  Physical Exam: Vital signs in last 24 hours: Temp:  [98 F (36.7 C)-98.7 F (37.1 C)] 98.4 F (36.9 C) (09/03 0803) Pulse Rate:  [59-109] 95 (09/03 0803) Resp:  [16-22] 16 (09/03 0803) BP: (112-180)/(61-109) 119/61 (09/03 0803) SpO2:  [99 %-100 %] 99 % (09/02 2244)   General: Alert 37 year old female in no acute distress. Head:  Normocephalic and atraumatic. Eyes:  No scleral icterus. Conjunctiva pink. Ears:  Normal auditory acuity. Nose:  No deformity, discharge or lesions. Mouth:  Dentition intact. No ulcers or lesions.  Neck:  Supple. No lymphadenopathy or thyromegaly.  Lungs: Breath sounds clear throughout. No wheezes, rhonchi or crackles.  Heart: Regular rate and rhythm, no murmurs. Abdomen: Soft, nondistended.  Mild tenderness to the LUQ, supraumbilical area into the LLQ without rebound or guarding.  Positive bowel sounds to all 4 quadrants.  No hepatosplenomegaly.  No bruit. Rectal: Deferred. Musculoskeletal:  Symmetrical without gross deformities.  Pulses:  Normal pulses noted. Extremities:  Without clubbing or edema. Neurologic:  Alert and  oriented x 4. No focal deficits.  Skin:  Intact without significant lesions or rashes. Psych:  Alert and cooperative. Normal mood and affect.  Intake/Output from previous day: No intake/output data recorded. Intake/Output this shift: No intake/output data recorded.  Lab Results: Recent Labs    12/21/22 1032 12/22/22 1522  WBC 11.5* 17.0*  HGB 10.3* 11.6*  HCT 30.9* 34.8*  PLT 358 508*   BMET Recent Labs    12/21/22 1032 12/22/22 1522  NA 136 139  K 3.4* 3.0*  CL 104 107  CO2 18* 19*  GLUCOSE 89  132*  BUN <5* 5*  CREATININE 0.95 1.16*  CALCIUM 9.2 9.6   LFT Recent Labs    12/22/22 1522  PROT 7.3  ALBUMIN 4.1  AST 24  ALT 29  ALKPHOS 62  BILITOT 2.2*   PT/INR No results for input(s): "LABPROT", "INR" in the last 72 hours. Hepatitis Panel No results for input(s): "HEPBSAG", "HCVAB", "HEPAIGM", "HEPBIGM" in the last 72 hours.    Studies/Results: CT ABDOMEN PELVIS W CONTRAST  Result Date: 12/22/2022 CLINICAL DATA:  Postop abdominal pain.  Recent vaginal hysterectomy. EXAM: CT ABDOMEN AND PELVIS WITH CONTRAST TECHNIQUE: Multidetector CT imaging of the abdomen and pelvis was performed using the standard protocol following bolus administration of intravenous contrast. RADIATION DOSE REDUCTION: This exam was performed according to the departmental dose-optimization program which includes automated exposure control, adjustment of the mA and/or kV according to patient size and/or use of iterative reconstruction technique. CONTRAST:  75mL OMNIPAQUE IOHEXOL 350 MG/ML SOLN COMPARISON:  12/19/2022 FINDINGS: Lower Chest: No acute findings. Hepatobiliary:  No suspicious hepatic masses identified. Pancreas:  No mass or inflammatory changes. Spleen: Within normal limits in size and appearance. Adrenals/Urinary Tract: No suspicious masses identified. No evidence of ureteral calculi or hydronephrosis. Delayed images show no evidence contrast extravasation from the ureters or urinary bladder. Stomach/Bowel: No evidence of obstruction, inflammatory process or abnormal fluid collections. Vascular/Lymphatic: No pathologically enlarged lymph nodes. No acute vascular findings. Reproductive: Prior hysterectomy again noted. High attenuation collection in the central pelvis shows mild decrease in size since previous study, currently measuring 9.4 x 4.0 cm compared to 9.9 x 6.2 cm previously. This is consistent  with resolving postop hematoma. Other:  None. Musculoskeletal:  No suspicious bone lesions identified.  IMPRESSION: Mild decrease in size of postop hematoma in the hysterectomy bed. No other acute findings or complication identified. Electronically Signed   By: Danae Orleans M.D.   On: 12/22/2022 16:57    IMPRESSION/PLAN:  37 year old female s/p total vaginal hysterectomy with bilateral salpingectomy by Dr. Alysia Penna 12/16/2022 discharged home 8/29.  Readmitted to the hospital with intractable N/V and LUQ pain 8/30. CTAP with contrast 8/30 showed high attenuation fluid within the central pelvis measuring 9.9 x 6.2 cm consistent with residual blood products post hysterectomy. Discharged home 9/2, then readmitted later the same evening. Repeat CTAP showed mild decrease in the size of post-op hematoma in the hysterectomy bed without acute intra-abdominal pathology to explain her symptoms.  WBC 17.  Total bili 2.2. Normal alk phos, AST/ALT levels.  -Clear liquid diet -IV fluids and pain management per the hospitalist -Continue Pantoprazole 40 mg p.o. daily -Ondansetron 4 mg p.o. or IV every 6 hours as needed -Defer endoscopic recommendations to Dr. Adela Lank -Hepatic panel in a.m. to include direct bilirubin level and lipase level -Miralax Q HS PRN  History of acute onset nausea/vomiting and diarrhea with LLQ pain 09/2021.  CTAP with contrast done in the ED at that time showed possible colitis to the descending and distal transverse colon treated with a course of Cipro and Flagyl. CTAP 8/30 and 12/22/2022 without evidence of colitis or colonic mass.  First degree relative (father) with history of colorectal cancer diagnosed in his early 51's. Screening colonoscopy 04/2016 resulted in a poor prep with recommendations to repeat a colonoscopy with extended 2-day bowel prep which was not done. -Eventual colonoscopy as an inpatient versus outpatient  Normocytic anemia status post total vaginal hysterectomy with bilateral salpingectomy 11/2022 -CBC, iron, TIBC and ferritin level in am  Hypokalemia -Potassium  replacement per the hospitalist   Arnaldo Natal  12/23/2022, 10:32AM

## 2022-12-23 NOTE — Telephone Encounter (Signed)
Patient called in and stated that she had a hysterectomy and was in and out of the hospital. She is currently in the hospital but not due to the hysterectomy. She stated that she has been vomiting and experiencing some sharp lower abdominal pain. She stated that everything is in her hospital notes and they wanted her to make you aware. Thank you!

## 2022-12-23 NOTE — H&P (View-Only) (Signed)
Referring Provider: Dr. Nettie Elm Primary Care Physician:  Mort Sawyers, FNP Primary Gastroenterologist:  Dr. Erick Blinks  Reason for Consultation:  N/V and abdominal pain   HPI: Holly Brown is a 37 y.o. female with a past medical history of seizure 04/2014, migraine headaches, gestational diabetes and menorrhagia with uterine fibroids. She underwent a total vaginal hysterectomy with bilateral salpingectomy by Dr. Alysia Penna 12/16/2022. She had postop nausea and vomiting which improved after receiving antiemetics. She was tolerating a solid diet, voiding without difficulty and passing gas per the rectum and was discharged home 12/18/2022. She endorsed having persistent nausea and vomiting, could not keep anything down with LUQ pain therefore she presented to the ED 12/19/2022.  Labs in the ED showed a WBC count of 13.4.  Hemoglobin 10.6 up from 9.7.  Potassium 3.2.  BUN < 5.  Creatinine 0.99.  Total bili 1.7.  Alk phos 50.  AST 30.  ALT 30. CTAP with contrast showed high attenuation fluid within the central pelvis measuring 9.9 x 6.2 cm consistent with residual blood products post hysterectomy.  She received IV fluids, antiemetics and potassium replacement. Her clinical status stabilized and she was discharged home 12/22/2018 on Pantoprazole 40 mg daily, Promethazine 25 mg every 6 hours and Hydromorphone 2 mg every 4 hours as needed.  She continued to have intractable nausea and vomiting with LUQ pain x 2 hours. She returned back to the ED yesterday evening.  Labs in the ED showed a WBC 17.  Hemoglobin 11.6.  Potassium 3.0.  BUN 5.  Creatinine 1.16.  Total bili 2.2.  Alk phos 62.  AST 24.  ALT 29.  Urinalysis positive for ketones and a small amount of hemoglobin with negative nitrite/leukocytes.  CTAP with contrast showed mild decrease in the size of postop hematoma in the hysterectomy bed without acute intra-abdominal pathology to explain her symptoms. A GI consult was requested for further evaluation  regarding nausea, vomiting and abdominal pain.  Her N/V and LUQ pain are controlled at this time.  Emesis described as clear to bilious and nonbloody.  She has infrequent heartburn.  No history of ulcers or gallstones. Infrequent NSAID use. She denies having any LUQ pain prior to her hysterectomy surgery.  She typically passes a normal brown bowel movement most days, no rectal bleeding or black stools.  Last bowel movement was days ago.  Infrequent alcohol intake.  She smokes marijuana once to twice weekly for many years.  She has a history of acute onset N/V and diarrhea with LLQ pain 09/23/2021.  CTAP with contrast done at [facility] ED showed a long segment of mild colonic wall thickening involving the descending and distal transverse colon suggestive of mild colitis.  She was prescribed a course of Cipro and Flagyl and was discharged home.  She underwent a screening colonoscopy 04/24/2016 due to father's history of colon cancer diagnosed in his early 16s which resulted in a poor prep with stool in the rectosigmoid and rectum.  She was advised to repeat a colonoscopy with a 2-day bowel prep which was not done.  GI PROCEDURES:  Colonoscopy 04/24/2016: - Preparation of the colon was unsatisfactory. - Stool in the rectum and in the recto-sigmoid colon. - No specimens collected.  Past Medical History:  Diagnosis Date   Gestational diabetes    metformin - resolved after delivery   History of UTI    HSV (herpes simplex virus) infection    Migraines    Peripheral vascular disease (HCC)  Post-operative nausea and vomiting 12/19/2022   Seizures (HCC)    Jan.2016 - only had one seizure, none since   Vaginal Pap smear, abnormal     Past Surgical History:  Procedure Laterality Date   DILATION AND CURETTAGE OF UTERUS     LAPAROSCOPIC TUBAL LIGATION Bilateral 12/08/2018   Procedure: LAPAROSCOPIC TUBAL LIGATION;  Surgeon: Catalina Antigua, MD;  Location: [facility];   Service: Gynecology;  Laterality: Bilateral;   LEEP N/A 03/18/2022   Procedure: LOOP ELECTROSURGICAL EXCISION PROCEDURE (LEEP);  Surgeon: Catalina Antigua, MD;  Location: [facility] OR;  Service: Gynecology;  Laterality: N/A;   VAGINAL HYSTERECTOMY N/A 12/16/2022   Procedure: TOTAL VAGINAL HYSTERECTOMY, BILATERAL SALPINGECTOMY;  Surgeon: Hermina Staggers, MD;  Location: [facility] OR;  Service: Gynecology;  Laterality: N/A;   WISDOM TOOTH EXTRACTION  04/21/2009    Prior to Admission medications   Medication Sig Start Date End Date Taking? Authorizing Provider  HYDROmorphone (DILAUDID) 2 MG tablet Take 1 tablet (2 mg total) by mouth every 4 (four) hours as needed for severe pain. 12/18/22  Yes Hermina Staggers, MD  alum & mag hydroxide-simeth (MAALOX/MYLANTA) 200-200-20 MG/5ML suspension Take 30 mLs by mouth every 4 (four) hours as needed for indigestion. 12/22/22   Lennart Pall, MD  HYDROmorphone (DILAUDID) 2 MG tablet Take 1 tablet (2 mg total) by mouth every 4 (four) hours as needed for severe pain. 12/18/22   Hermina Staggers, MD  pantoprazole (PROTONIX) 40 MG tablet Take 1 tablet (40 mg total) by mouth daily. 12/22/22   Lennart Pall, MD  promethazine (PHENERGAN) 25 MG tablet Take 1 tablet (25 mg total) by mouth every 6 (six) hours as needed for nausea or vomiting. 12/18/22   Hermina Staggers, MD  senna (SENOKOT) 8.6 MG TABS tablet Take 1 tablet (8.6 mg total) by mouth 2 (two) times daily. 12/18/22   Hermina Staggers, MD  simethicone (MYLICON) 80 MG chewable tablet Chew 1 tablet (80 mg total) by mouth 4 (four) times daily as needed for flatulence. 12/18/22   Hermina Staggers, MD    Current Facility-Administered Medications  Medication Dose Route Frequency Provider Last Rate Last Admin   alum & mag hydroxide-simeth (MAALOX/MYLANTA) 200-200-20 MG/5ML suspension 30 mL  30 mL Oral Q4H PRN Warden Fillers, MD       bisacodyl (DULCOLAX) suppository 10 mg  10 mg Rectal Daily PRN Warden Fillers, MD        ketorolac (TORADOL) 30 MG/ML injection 15 mg  15 mg Intravenous Q6H Hermina Staggers, MD   15 mg at 12/23/22 0915   ondansetron (ZOFRAN) tablet 4 mg  4 mg Oral Q6H PRN Warden Fillers, MD   4 mg at 12/22/22 2349   Or   ondansetron (ZOFRAN) injection 4 mg  4 mg Intravenous Q6H PRN Warden Fillers, MD       oxyCODONE (Oxy IR/ROXICODONE) immediate release tablet 10 mg  10 mg Oral Q4H PRN Warden Fillers, MD       pantoprazole (PROTONIX) EC tablet 40 mg  40 mg Oral Daily Warden Fillers, MD   40 mg at 12/23/22 0915   potassium chloride 10 mEq in 100 mL IVPB  10 mEq Intravenous Q1 Hr x 4 Warden Fillers, MD 100 mL/hr at 12/23/22 0913 10 mEq at 12/23/22 0913   scopolamine (TRANSDERM-SCOP) 1 MG/3DAYS 1.5 mg  1 patch Transdermal Q72H Hermina Staggers, MD   1.5 mg at 12/23/22 0915  senna (SENOKOT) tablet 8.6 mg  1 tablet Oral BID Warden Fillers, MD   8.6 mg at 12/23/22 0915    Allergies as of 12/22/2022   (No Known Allergies)    Family History  Problem Relation Age of Onset   Diabetes Mother    Stroke Mother    Colon cancer Father 39   Hypertension Maternal Grandmother    Diabetes Maternal Grandmother    Hypertension Paternal Grandmother     Social History   Socioeconomic History   Marital status: Single    Spouse name: Not on file   Number of children: 3   Years of education: college   Highest education level: Not on file  Occupational History    Employer: [employer]    Comment: service desk analyst  Tobacco Use   Smoking status: Former    Current packs/day: 0.25    Average packs/day: 0.3 packs/day for 11.0 years (2.8 ttl pk-yrs)    Types: Cigarettes    Start date: 12/21/2011    Passive exposure: Never   Smokeless tobacco: Never  Vaping Use   Vaping status: Never Used  Substance and Sexual Activity   Alcohol use: Not Currently    Comment: ocassionally    Drug use: Not Currently    Types: Marijuana    Comment: Last use was in 2023   Sexual activity: Not Currently     Partners: Male    Birth control/protection: Surgical    Comment: Tubal Ligation  Other Topics Concern   Not on file  Social History Narrative   Lives with her grandmother and her three children   Does not drink caffeine    Social Determinants of Health   Financial Resource Strain: Low Risk  (02/01/2018)   Overall Financial Resource Strain (CARDIA)    Difficulty of Paying Living Expenses: Not hard at all  Food Insecurity: No Food Insecurity (12/22/2022)   Hunger Vital Sign    Worried About Running Out of Food in the Last Year: Never true    Ran Out of Food in the Last Year: Never true  Transportation Needs: No Transportation Needs (12/22/2022)   PRAPARE - Administrator, Civil Service (Medical): No    Lack of Transportation (Non-Medical): No  Physical Activity: Not on file  Stress: No Stress Concern Present (08/17/2018)   [employer] of Occupational Health - Occupational Stress Questionnaire    Feeling of Stress : Not at all  Social Connections: Unknown (08/31/2018)   Social Connection and Isolation Panel [NHANES]    Frequency of Communication with Friends and Family: More than three times a week    Frequency of Social Gatherings with Friends and Family: More than three times a week    Attends Religious Services: Not on file    Active Member of Clubs or Organizations: Not on file    Attends Banker Meetings: Not on file    Marital Status: Not on file  Intimate Partner Violence: Not At Risk (12/22/2022)   Humiliation, Afraid, Rape, and Kick questionnaire    Fear of Current or Ex-Partner: No    Emotionally Abused: No    Physically Abused: No    Sexually Abused: No    Review of Systems: Gen: Denies fever, sweats or chills. No weight loss.  CV: Denies chest pain, palpitations or edema. Resp: Denies cough, shortness of breath of hemoptysis.  GI: See HPI. GU : Denies urinary burning, blood in urine, increased urinary frequency or incontinence. MS: Denies  joint pain, muscles aches or weakness. Derm: Denies rash, itchiness, skin lesions or unhealing ulcers. Psych: Denies depression, anxiety, memory loss or confusion. Heme: Denies easy bruising, bleeding. Neuro:  Denies headaches, dizziness or paresthesias. Endo:  Denies any problems with DM, thyroid or adrenal function.  Physical Exam: Vital signs in last 24 hours: Temp:  [98 F (36.7 C)-98.7 F (37.1 C)] 98.4 F (36.9 C) (09/03 0803) Pulse Rate:  [59-109] 95 (09/03 0803) Resp:  [16-22] 16 (09/03 0803) BP: (112-180)/(61-109) 119/61 (09/03 0803) SpO2:  [99 %-100 %] 99 % (09/02 2244)   General: Alert 37 year old female in no acute distress. Head:  Normocephalic and atraumatic. Eyes:  No scleral icterus. Conjunctiva pink. Ears:  Normal auditory acuity. Nose:  No deformity, discharge or lesions. Mouth:  Dentition intact. No ulcers or lesions.  Neck:  Supple. No lymphadenopathy or thyromegaly.  Lungs: Breath sounds clear throughout. No wheezes, rhonchi or crackles.  Heart: Regular rate and rhythm, no murmurs. Abdomen: Soft, nondistended.  Mild tenderness to the LUQ, supraumbilical area into the LLQ without rebound or guarding.  Positive bowel sounds to all 4 quadrants.  No hepatosplenomegaly.  No bruit. Rectal: Deferred. Musculoskeletal:  Symmetrical without gross deformities.  Pulses:  Normal pulses noted. Extremities:  Without clubbing or edema. Neurologic:  Alert and  oriented x 4. No focal deficits.  Skin:  Intact without significant lesions or rashes. Psych:  Alert and cooperative. Normal mood and affect.  Intake/Output from previous day: No intake/output data recorded. Intake/Output this shift: No intake/output data recorded.  Lab Results: Recent Labs    12/21/22 1032 12/22/22 1522  WBC 11.5* 17.0*  HGB 10.3* 11.6*  HCT 30.9* 34.8*  PLT 358 508*   BMET Recent Labs    12/21/22 1032 12/22/22 1522  NA 136 139  K 3.4* 3.0*  CL 104 107  CO2 18* 19*  GLUCOSE 89  132*  BUN <5* 5*  CREATININE 0.95 1.16*  CALCIUM 9.2 9.6   LFT Recent Labs    12/22/22 1522  PROT 7.3  ALBUMIN 4.1  AST 24  ALT 29  ALKPHOS 62  BILITOT 2.2*   PT/INR No results for input(s): "LABPROT", "INR" in the last 72 hours. Hepatitis Panel No results for input(s): "HEPBSAG", "HCVAB", "HEPAIGM", "HEPBIGM" in the last 72 hours.    Studies/Results: CT ABDOMEN PELVIS W CONTRAST  Result Date: 12/22/2022 CLINICAL DATA:  Postop abdominal pain.  Recent vaginal hysterectomy. EXAM: CT ABDOMEN AND PELVIS WITH CONTRAST TECHNIQUE: Multidetector CT imaging of the abdomen and pelvis was performed using the standard protocol following bolus administration of intravenous contrast. RADIATION DOSE REDUCTION: This exam was performed according to the departmental dose-optimization program which includes automated exposure control, adjustment of the mA and/or kV according to patient size and/or use of iterative reconstruction technique. CONTRAST:  75mL OMNIPAQUE IOHEXOL 350 MG/ML SOLN COMPARISON:  12/19/2022 FINDINGS: Lower Chest: No acute findings. Hepatobiliary:  No suspicious hepatic masses identified. Pancreas:  No mass or inflammatory changes. Spleen: Within normal limits in size and appearance. Adrenals/Urinary Tract: No suspicious masses identified. No evidence of ureteral calculi or hydronephrosis. Delayed images show no evidence contrast extravasation from the ureters or urinary bladder. Stomach/Bowel: No evidence of obstruction, inflammatory process or abnormal fluid collections. Vascular/Lymphatic: No pathologically enlarged lymph nodes. No acute vascular findings. Reproductive: Prior hysterectomy again noted. High attenuation collection in the central pelvis shows mild decrease in size since previous study, currently measuring 9.4 x 4.0 cm compared to 9.9 x 6.2 cm previously. This is consistent  with resolving postop hematoma. Other:  None. Musculoskeletal:  No suspicious bone lesions identified.  IMPRESSION: Mild decrease in size of postop hematoma in the hysterectomy bed. No other acute findings or complication identified. Electronically Signed   By: Danae Orleans M.D.   On: 12/22/2022 16:57    IMPRESSION/PLAN:  37 year old female s/p total vaginal hysterectomy with bilateral salpingectomy by Dr. Alysia Penna 12/16/2022 discharged home 8/29.  Readmitted to the hospital with intractable N/V and LUQ pain 8/30. CTAP with contrast 8/30 showed high attenuation fluid within the central pelvis measuring 9.9 x 6.2 cm consistent with residual blood products post hysterectomy. Discharged home 9/2, then readmitted later the same evening. Repeat CTAP showed mild decrease in the size of post-op hematoma in the hysterectomy bed without acute intra-abdominal pathology to explain her symptoms.  WBC 17.  Total bili 2.2. Normal alk phos, AST/ALT levels.  -Clear liquid diet -IV fluids and pain management per the hospitalist -Continue Pantoprazole 40 mg p.o. daily -Ondansetron 4 mg p.o. or IV every 6 hours as needed -Defer endoscopic recommendations to Dr. Adela Lank -Hepatic panel in a.m. to include direct bilirubin level and lipase level -Miralax Q HS PRN  History of acute onset nausea/vomiting and diarrhea with LLQ pain 09/2021.  CTAP with contrast done in the ED at that time showed possible colitis to the descending and distal transverse colon treated with a course of Cipro and Flagyl. CTAP 8/30 and 12/22/2022 without evidence of colitis or colonic mass.  First degree relative (father) with history of colorectal cancer diagnosed in his early 51's. Screening colonoscopy 04/2016 resulted in a poor prep with recommendations to repeat a colonoscopy with extended 2-day bowel prep which was not done. -Eventual colonoscopy as an inpatient versus outpatient  Normocytic anemia status post total vaginal hysterectomy with bilateral salpingectomy 11/2022 -CBC, iron, TIBC and ferritin level in am  Hypokalemia -Potassium  replacement per the hospitalist   Arnaldo Natal  12/23/2022, 10:32AM

## 2022-12-23 NOTE — Telephone Encounter (Signed)
Noted  

## 2022-12-24 ENCOUNTER — Inpatient Hospital Stay (HOSPITAL_COMMUNITY): Payer: 59 | Admitting: Anesthesiology

## 2022-12-24 ENCOUNTER — Encounter (HOSPITAL_COMMUNITY)
Admission: AD | Disposition: A | Discharge status: Home / Self Care | Payer: Self-pay | Source: Home / Self Care | Attending: Obstetrics and Gynecology

## 2022-12-24 ENCOUNTER — Encounter (HOSPITAL_COMMUNITY): Payer: Self-pay | Admitting: Obstetrics and Gynecology

## 2022-12-24 ENCOUNTER — Telehealth: Payer: Self-pay

## 2022-12-24 DIAGNOSIS — K449 Diaphragmatic hernia without obstruction or gangrene: Secondary | ICD-10-CM

## 2022-12-24 DIAGNOSIS — K269 Duodenal ulcer, unspecified as acute or chronic, without hemorrhage or perforation: Secondary | ICD-10-CM

## 2022-12-24 HISTORY — PX: BIOPSY: SHX5522

## 2022-12-24 HISTORY — PX: ESOPHAGOGASTRODUODENOSCOPY (EGD) WITH PROPOFOL: SHX5813

## 2022-12-24 LAB — CBC
HCT: 30.6 % — ABNORMAL LOW (ref 36.0–46.0)
Hemoglobin: 10.2 g/dL — ABNORMAL LOW (ref 12.0–15.0)
MCH: 30.7 pg (ref 26.0–34.0)
MCHC: 33.3 g/dL (ref 30.0–36.0)
MCV: 92.2 fL (ref 80.0–100.0)
Platelets: 472 10*3/uL — ABNORMAL HIGH (ref 150–400)
RBC: 3.32 MIL/uL — ABNORMAL LOW (ref 3.87–5.11)
RDW: 13.7 % (ref 11.5–15.5)
WBC: 13 10*3/uL — ABNORMAL HIGH (ref 4.0–10.5)
nRBC: 0 % (ref 0.0–0.2)

## 2022-12-24 LAB — BASIC METABOLIC PANEL
Anion gap: 13 (ref 5–15)
BUN: 9 mg/dL (ref 6–20)
CO2: 21 mmol/L — ABNORMAL LOW (ref 22–32)
Calcium: 9 mg/dL (ref 8.9–10.3)
Chloride: 105 mmol/L (ref 98–111)
Creatinine, Ser: 1.12 mg/dL — ABNORMAL HIGH (ref 0.44–1.00)
GFR, Estimated: >60 mL/min (ref >60)
Glucose, Bld: 95 mg/dL (ref 70–99)
Potassium: 3.5 mmol/L (ref 3.5–5.1)
Sodium: 139 mmol/L (ref 135–145)

## 2022-12-24 LAB — IRON AND TIBC
Iron: 31 ug/dL (ref 28–170)
Saturation Ratios: 9 % — ABNORMAL LOW (ref 10.4–31.8)
TIBC: 340 ug/dL (ref 250–450)
UIBC: 309 ug/dL

## 2022-12-24 LAB — VITAMIN B12: Vitamin B-12: 767 pg/mL (ref 180–914)

## 2022-12-24 LAB — FERRITIN: Ferritin: 89 ng/mL (ref 11–307)

## 2022-12-24 SURGERY — ESOPHAGOGASTRODUODENOSCOPY (EGD) WITH PROPOFOL
Anesthesia: Monitor Anesthesia Care

## 2022-12-24 MED ORDER — KETOROLAC TROMETHAMINE 15 MG/ML IJ SOLN
30.0000 mg | Freq: Once | INTRAMUSCULAR | Status: AC
  Administered 2022-12-24: 30 mg via INTRAVENOUS
  Filled 2022-12-24: qty 2

## 2022-12-24 MED ORDER — METOCLOPRAMIDE HCL 5 MG/ML IJ SOLN
10.0000 mg | Freq: Four times a day (QID) | INTRAMUSCULAR | Status: DC
  Administered 2022-12-25 (×2): 10 mg via INTRAVENOUS
  Filled 2022-12-24 (×3): qty 2

## 2022-12-24 MED ORDER — PANTOPRAZOLE SODIUM 40 MG IV SOLR
40.0000 mg | Freq: Two times a day (BID) | INTRAVENOUS | Status: DC
  Administered 2022-12-25 (×2): 40 mg via INTRAVENOUS
  Filled 2022-12-24 (×2): qty 10

## 2022-12-24 MED ORDER — LACTATED RINGERS IV SOLN: INTRAVENOUS | Status: DC

## 2022-12-24 MED ORDER — ZOLPIDEM TARTRATE 5 MG PO TABS
5.0000 mg | ORAL_TABLET | Freq: Every evening | ORAL | Status: DC | PRN
  Administered 2022-12-24: 5 mg via ORAL
  Filled 2022-12-24: qty 1

## 2022-12-24 MED ORDER — SODIUM CHLORIDE 0.9 % IV SOLN
25.0000 mg | Freq: Four times a day (QID) | INTRAVENOUS | Status: DC | PRN
  Filled 2022-12-24: qty 1

## 2022-12-24 MED ORDER — METOCLOPRAMIDE HCL 10 MG PO TABS: 5.0000 mg | ORAL_TABLET | Freq: Three times a day (TID) | ORAL | Status: DC

## 2022-12-24 MED ORDER — AMISULPRIDE (ANTIEMETIC) 5 MG/2ML IV SOLN
INTRAVENOUS | Status: AC
  Filled 2022-12-24: qty 4

## 2022-12-24 MED ORDER — SODIUM CHLORIDE 0.9 % IV SOLN: INTRAVENOUS | Status: DC

## 2022-12-24 MED ORDER — AMISULPRIDE (ANTIEMETIC) 5 MG/2ML IV SOLN
10.0000 mg | Freq: Once | INTRAVENOUS | Status: AC
  Administered 2022-12-24: 10 mg via INTRAVENOUS

## 2022-12-24 MED ORDER — OXYCODONE HCL 5 MG PO TABS
ORAL_TABLET | ORAL | Status: AC
  Filled 2022-12-24: qty 2

## 2022-12-24 MED ORDER — PROPOFOL 500 MG/50ML IV EMUL
INTRAVENOUS | Status: DC | PRN
  Administered 2022-12-24: 125 ug/kg/min via INTRAVENOUS
  Administered 2022-12-24 (×2): 50 mg via INTRAVENOUS

## 2022-12-24 MED ORDER — HYDROMORPHONE HCL 1 MG/ML IJ SOLN
1.0000 mg | Freq: Once | INTRAMUSCULAR | Status: AC
  Administered 2022-12-24: 1 mg via INTRAVENOUS
  Filled 2022-12-24: qty 1

## 2022-12-24 MED ORDER — OXYCODONE HCL 5 MG PO TABS
ORAL_TABLET | ORAL | Status: AC
  Filled 2022-12-24: qty 1

## 2022-12-24 MED ORDER — SIMETHICONE 80 MG PO CHEW
80.0000 mg | CHEWABLE_TABLET | Freq: Four times a day (QID) | ORAL | Status: DC
  Administered 2022-12-24: 80 mg via ORAL
  Filled 2022-12-24: qty 1

## 2022-12-24 MED ORDER — ACETAMINOPHEN 10 MG/ML IV SOLN
1000.0000 mg | Freq: Once | INTRAVENOUS | Status: AC
  Administered 2022-12-24: 1000 mg via INTRAVENOUS
  Filled 2022-12-24: qty 100

## 2022-12-24 SURGICAL SUPPLY — 15 items

## 2022-12-24 NOTE — Interval H&P Note (Signed)
History and Physical Interval Note: Patient here for EGD this AM. She has been doing okay since I have last seen her yesterday. No significant pain. No recurrent nausea / vomiting. Given her history of recurrent pain and nausea / vomiting she wishes to proceed with endoscopy today. Following discussion of the procedure and anesthesia she wishes to proceed. All questions answered.   12/24/2022 9:11 AM  Nishat Fabio Neighbors  has presented today for surgery, with the diagnosis of Nausea, vomiting, left upper abdominal pain.  The various methods of treatment have been discussed with the patient and family. After consideration of risks, benefits and other options for treatment, the patient has consented to  Procedure(s): ESOPHAGOGASTRODUODENOSCOPY (EGD) WITH PROPOFOL (N/A) as a surgical intervention.  The patient's history has been reviewed, patient examined, no change in status, stable for surgery.  I have reviewed the patient's chart and labs.  Questions were answered to the patient's satisfaction.     Viviann Spare P Evana Runnels

## 2022-12-24 NOTE — Anesthesia Preprocedure Evaluation (Signed)
Anesthesia Evaluation  Patient identified by MRN, date of birth, ID band Patient awake    Reviewed: Allergy & Precautions, NPO status , Patient's Chart, lab work & pertinent test results  History of Anesthesia Complications (+) PONV and history of anesthetic complications  Airway Mallampati: III  TM Distance: >3 FB Neck ROM: Full    Dental  (+) Dental Advisory Given, Teeth Intact,    Pulmonary neg shortness of breath, neg sleep apnea, neg COPD, neg recent URI, former smoker   breath sounds clear to auscultation       Cardiovascular + Peripheral Vascular Disease   Rhythm:Regular     Neuro/Psych neg Seizures  negative psych ROS   GI/Hepatic Neg liver ROS,,,  Endo/Other  diabetes    Renal/GU Renal diseaseLab Results      Component                Value               Date                      NA                       139                 12/24/2022                K                        3.5                 12/24/2022                CO2                      21 (L)              12/24/2022                GLUCOSE                  95                  12/24/2022                BUN                      9                   12/24/2022                CREATININE               1.12 (H)            12/24/2022                CALCIUM                  9.0                 12/24/2022                GFRNONAA                 >60                 12/24/2022  Musculoskeletal   Abdominal   Peds  Hematology  (+) Blood dyscrasia, anemia Lab Results      Component                Value               Date                      WBC                      13.0 (H)            12/24/2022                HGB                      10.2 (L)            12/24/2022                HCT                      30.6 (L)            12/24/2022                MCV                      92.2                12/24/2022                PLT                      472  (H)             12/24/2022              Anesthesia Other Findings   Reproductive/Obstetrics                              Anesthesia Physical Anesthesia Plan  ASA: 2  Anesthesia Plan: MAC   Post-op Pain Management: Minimal or no pain anticipated   Induction: Intravenous  PONV Risk Score and Plan: 3 and TIVA and Treatment may vary due to age or medical condition  Airway Management Planned: Nasal Cannula, Natural Airway and Simple Face Mask  Additional Equipment: None  Intra-op Plan:   Post-operative Plan:   Informed Consent: I have reviewed the patients History and Physical, chart, labs and discussed the procedure including the risks, benefits and alternatives for the proposed anesthesia with the patient or authorized representative who has indicated his/her understanding and acceptance.     Dental advisory given  Plan Discussed with: CRNA  Anesthesia Plan Comments:          Anesthesia Quick Evaluation

## 2022-12-24 NOTE — Telephone Encounter (Signed)
Left message for patient to call back to schedule new patient appointment.  Try to schedule for 9/26 or 9/27.Marland Kitchen

## 2022-12-24 NOTE — Op Note (Addendum)
[facility] Patient Name: Holly Brown Procedure Date : 12/24/2022 MRN: [id number] Attending MD: Willaim Rayas. Adela Lank , MD, [id number] Date of Birth: 1985-12-01 CSN: [id number] Age: 37 Admit Type: Inpatient Procedure:                Upper GI endoscopy Indications:              recent pelvic surgery - now with postoperative                            lower and upper abdominal pain in the left upper                            quadrant, Nausea with vomiting Providers:                Viviann Spare P. Adela Lank, MD, Doristine Mango, RN,                            Jacquelyn "Jaci" Clelia Croft, RN, Rozetta Nunnery,                            Technician Referring MD:             Regis Bill. Bass Medicines:                Monitored Anesthesia Care Complications:            No immediate complications. Estimated blood loss:                            Minimal. Estimated Blood Loss:     Estimated blood loss was minimal. Procedure:                Pre-Anesthesia Assessment:                           - Prior to the procedure, a History and Physical                            was performed, and patient medications and                            allergies were reviewed. The patient's tolerance of                            previous anesthesia was also reviewed. The risks                            and benefits of the procedure and the sedation                            options and risks were discussed with the patient.                            All questions were answered, and informed consent  was obtained. Prior Anticoagulants: The patient has                            taken no anticoagulant or antiplatelet agents. ASA                            Grade Assessment: II - A patient with mild systemic                            disease. After reviewing the risks and benefits,                            the patient was deemed in satisfactory condition to                             undergo the procedure.                           After obtaining informed consent, the endoscope was                            passed under direct vision. Throughout the                            procedure, the patient's blood pressure, pulse, and                            oxygen saturations were monitored continuously. The                            [id number]-H190 ([id number]) Olympus endoscope was introduced                            through the mouth, and advanced to the second part                            of duodenum. The upper GI endoscopy was                            accomplished without difficulty. The patient                            tolerated the procedure well. Scope In: Scope Out: Findings:      Esophagogastric landmarks were identified: the Z-line was found at 39       cm, the gastroesophageal junction was found at 39 cm and the upper       extent of the gastric folds was found at 40 cm from the incisors.      A 1 cm hiatal hernia was present.      The exam of the esophagus was otherwise normal.      The entire examined stomach was normal. Biopsies were taken with a cold       forceps for Helicobacter pylori testing.      One non-bleeding superficial duodenal ulcer with a clean ulcer  base       (Forrest Class III) was found in the duodenal bulb. The lesion was 4-5       mm in largest dimension. The pyloric channel was patent, no outlet       obstruction or associated edema.      The exam of the duodenum was otherwise normal. Impression:               - Esophagogastric landmarks identified.                           - 1 cm hiatal hernia.                           - Normal esophagus otherwise.                           - Normal stomach. Biopsied to rule out H Pyloti.                           - Non-bleeding duodenal ulcer with a clean ulcer                            base (Forrest Class III).                           Ulcer noted on this exam however her pain /                             symptoms seem to be out of proportion to endoscopic                            findings, would not expect this small ulcer to                            cause this degree of symptoms, but will treat.                           Up until this point she had been doing better with                            pain control and no vomiting. Post procedure she                            reports having recurrent LOWER abdominal pain. She                            has not had recurrent upper abdominal pain. Her                            abdomen is soft. I do not think EGD findings would                            account for her symptoms at this point. Unclear if  this is slow to respond postoperative pain /                            related to hematoma, etc? We have contacted GYN for                            reassessment Recommendation:           - Return patient to hospital ward for ongoing care.                           - Advance diet as tolerated.                           - Continue present medications.                           - Continue protonix 40mg  twice daily for one month                            to treat ulcer                           - Await pathology results. Rule out H Pylori                           - Please try to minimize NSAID use (she has been                            receiving Toradol)                           - Continue scheduled Zofran every 6-8 hours.                           - Please call with questions / concerns moving                            forward. If her upper abdominal pain / vomiting                            recur, please contact us, otherwise symptoms                            predominantly lower pain. Will follow peripherally                            at this time. Procedure Code(s):        --- Professional ---                           [id number], Esophagogastroduodenoscopy, flexible,                             transoral; with biopsy, single or multiple Diagnosis Code(s):        ---  Professional ---                           K44.9, Diaphragmatic hernia without obstruction or                            gangrene                           K26.9, Duodenal ulcer, unspecified as acute or                            chronic, without hemorrhage or perforation                           R10.12, Left upper quadrant pain                           R11.2, Nausea with vomiting, unspecified CPT copyright 2022 [employer]. All rights reserved. The codes documented in this report are preliminary and upon coder review may  be revised to meet current compliance requirements. Viviann Spare P. Latrell Reitan, MD 12/24/2022 9:56:35 AM This report has been signed electronically. Number of Addenda: 0

## 2022-12-24 NOTE — Progress Notes (Signed)
Pt very sleepy following pain medications and EGD. No N/V since procedure and presently denies pain EGD results reviewed with pt. PE AF VSS Lungs clear Heart  RRR Abd soft + BS Ext  non tender  A/P POD # 8 TVH        Post op N/V and pain        Pelvic Hematoma        Elevated Crt        Stage 1 cervical cancer        Gastric Ulcer  Discussed again pain management expectations. Pt informed we will try to manage her pain with oral pain medication. Will add Reglan for N/V. Appreciate GI's help. Will continue to monitor overnight with the plan to discharge home tomorrow.

## 2022-12-24 NOTE — Transfer of Care (Signed)
Immediate Anesthesia Transfer of Care Note  Patient: Holly Brown  Procedure(s) Performed: ESOPHAGOGASTRODUODENOSCOPY (EGD) WITH PROPOFOL BIOPSY  Patient Location: Endoscopy Unit  Anesthesia Type:MAC  Level of Consciousness: awake, alert , oriented, and patient cooperative  Airway & Oxygen Therapy: Patient Spontanous Breathing  Post-op Assessment: Report given to RN and Post -op Vital signs reviewed and stable  Post vital signs: Reviewed and stable  Last Vitals:  Vitals Value Taken Time  BP 165/140 12/24/22 0953  Temp 36.4 C 12/24/22 0953  Pulse 116 12/24/22 0955  Resp 14 12/24/22 0955  SpO2 100 % 12/24/22 0955  Vitals shown include unfiled device data.  Last Pain:  Vitals:   12/24/22 0953  TempSrc: Temporal  PainSc:       Patients Stated Pain Goal: 4 (12/24/22 0837)  Complications: No notable events documented.

## 2022-12-25 LAB — SURGICAL PATHOLOGY

## 2022-12-25 MED ORDER — PANTOPRAZOLE SODIUM 40 MG PO TBEC
40.0000 mg | DELAYED_RELEASE_TABLET | Freq: Two times a day (BID) | ORAL | Status: DC
  Administered 2022-12-25 – 2022-12-26 (×2): 40 mg via ORAL
  Filled 2022-12-25 (×2): qty 1

## 2022-12-25 MED ORDER — BISACODYL 10 MG RE SUPP
10.0000 mg | Freq: Once | RECTAL | Status: AC
  Administered 2022-12-25: 10 mg via RECTAL
  Filled 2022-12-25: qty 1

## 2022-12-25 MED ORDER — OXYCODONE HCL 5 MG PO TABS
5.0000 mg | ORAL_TABLET | ORAL | Status: DC | PRN
  Administered 2022-12-25: 5 mg via ORAL
  Filled 2022-12-25: qty 1

## 2022-12-25 MED ORDER — SUCRALFATE 1 G PO TABS
1.0000 g | ORAL_TABLET | Freq: Three times a day (TID) | ORAL | Status: DC
  Administered 2022-12-25 – 2022-12-26 (×3): 1 g via ORAL
  Filled 2022-12-25 (×8): qty 1

## 2022-12-25 MED ORDER — METOCLOPRAMIDE HCL 10 MG PO TABS
10.0000 mg | ORAL_TABLET | Freq: Four times a day (QID) | ORAL | Status: DC
  Administered 2022-12-25 – 2022-12-26 (×3): 10 mg via ORAL
  Filled 2022-12-25 (×3): qty 1

## 2022-12-25 NOTE — Progress Notes (Signed)
Patient ID: Holly Brown, female   DOB: Nov 30, 1985, 37 y.o.   MRN: [id number] Pt feels much better. Up ambulating in room. Tolerating diet, No N/V and pain controlled  PE AF VSS Lungs clear Heart RRR Abd soft + BS  A/P S/P TVH        Pelvic hematoma        Post op N/V and pain        Gastric Ulcer        Stage 1 cervical cancer  Pt improved with Reglan for N/V. Min need for oral pain meds today thus far If continues to do well, plan for discharge home tomorrow.

## 2022-12-25 NOTE — Progress Notes (Signed)
noted 

## 2022-12-25 NOTE — Anesthesia Postprocedure Evaluation (Signed)
Anesthesia Post Note  Patient: Holly Brown  Procedure(s) Performed: ESOPHAGOGASTRODUODENOSCOPY (EGD) WITH PROPOFOL BIOPSY     Patient location during evaluation: Endoscopy Anesthesia Type: MAC Level of consciousness: awake and alert Pain management: pain level controlled Vital Signs Assessment: post-procedure vital signs reviewed and stable Respiratory status: spontaneous breathing, nonlabored ventilation and respiratory function stable Cardiovascular status: stable and blood pressure returned to baseline Postop Assessment: no apparent nausea or vomiting Anesthetic complications: no   No notable events documented.  Last Vitals:  Vitals:   12/25/22 0758 12/25/22 1348  BP: 119/69 108/62  Pulse: 83   Resp: 17 18  Temp: 37.1 C 37.3 C  SpO2: 100% 100%    Last Pain:  Vitals:   12/25/22 1348  TempSrc: Oral  PainSc:                  Larene Ascencio

## 2022-12-25 NOTE — Progress Notes (Signed)
Pathology results returned from EGD  FINAL MICROSCOPIC DIAGNOSIS:   A. STOMACH, ANTRUM AND BODY, BIOPSY:  Reactive gastropathy with minimal chronic gastritis  Negative for H. pylori, intestinal metaplasia, dysplasia and carcinoma    Negative for H pylori. Duodenal ulcer was very small, unlikely the source of her lower abdominal pain, symptoms out of proportion to objective findings.  Recommend she continue protonix 40mg  twice daily for 1 month after discharge to treat the ulcer, and then can use as needed. Continue Zofran as needed If Reglan helps can continue low dose 5mg  TID as needed as outpatient.  Call with questions or if symptoms persist.   Harlin Rain, MD [facility]

## 2022-12-26 ENCOUNTER — Other Ambulatory Visit (HOSPITAL_COMMUNITY): Payer: Self-pay

## 2022-12-26 MED ORDER — SUCRALFATE 1 G PO TABS
1.0000 g | ORAL_TABLET | Freq: Three times a day (TID) | ORAL | 2 refills | Status: DC
  Filled 2022-12-26: qty 60, 15d supply, fill #0

## 2022-12-26 MED ORDER — PANTOPRAZOLE SODIUM 40 MG PO TBEC
40.0000 mg | DELAYED_RELEASE_TABLET | Freq: Two times a day (BID) | ORAL | 2 refills | Status: DC
  Filled 2022-12-26: qty 60, 30d supply, fill #0

## 2022-12-26 MED ORDER — METOCLOPRAMIDE HCL 10 MG PO TABS
10.0000 mg | ORAL_TABLET | Freq: Three times a day (TID) | ORAL | 2 refills | Status: DC
  Filled 2022-12-26: qty 60, 15d supply, fill #0

## 2022-12-26 NOTE — Progress Notes (Signed)
Pt discharged home in stable condition after discharge instructions given. Pt verbalized understanding and all questions were answered. IV was discontinued and pt was sent home with all belongings.

## 2022-12-26 NOTE — Discharge Summary (Signed)
Physician Discharge Summary  Patient ID: Holly Brown MRN: [id number] DOB/AGE: [birth year] 35 y.o.  Admit date: 12/22/2022 Discharge date: 12/26/2022  Admission Diagnoses: Post op N/V  Discharge Diagnoses:  Principal Problem:   Postoperative pain Active Problems:   Nausea and vomiting   Left upper quadrant abdominal pain   Duodenal ulcer   Discharged Condition: good  Hospital Course: Ms Cerar was admitted with post op N/V and pain. See admit H & P for additional information. GI was consulted. She underwent EGD which revealed a gastric ulcer. She was started on protoniocs and Carafate. Reglan for her N/V with good response. She progressed to ambulating, voiding, + BM, and good oral pain control. Felt amendable for discharge home. Discharge instructions, medications and follow up were reviewed with pt. Pt verbalized understanding.   Consults: GI  Significant Diagnostic Studies: labs  Treatments: IV hydration  Discharge Exam: Blood pressure 118/67, pulse 99, temperature 98 F (36.7 C), temperature source Oral, resp. rate 16, height 5\' 10"  (1.778 m), weight 108.9 kg, SpO2 99%. Lungs clear Heart RRR Abd soft + BS GU no bleeding Ext non tender  Disposition: Discharge disposition: 01-Home or Self Care       Discharge Instructions     Call MD for:  difficulty breathing, headache or visual disturbances   Complete by: As directed    Call MD for:  extreme fatigue   Complete by: As directed    Call MD for:  hives   Complete by: As directed    Call MD for:  persistant dizziness or light-headedness   Complete by: As directed    Call MD for:  persistant nausea and vomiting   Complete by: As directed    Call MD for:  redness, tenderness, or signs of infection (pain, swelling, redness, odor or green/yellow discharge around incision site)   Complete by: As directed    Call MD for:  severe uncontrolled pain   Complete by: As directed    Call MD for:  temperature >100.4    Complete by: As directed    Diet - low sodium heart healthy   Complete by: As directed    Increase activity slowly   Complete by: As directed    Sexual Activity Restrictions   Complete by: As directed    Pelvic rest x 4 weeks      Allergies as of 12/26/2022   No Known Allergies      Medication List     STOP taking these medications    promethazine 25 MG tablet Commonly known as: PHENERGAN   senna 8.6 MG Tabs tablet Commonly known as: SENOKOT   simethicone 80 MG chewable tablet Commonly known as: MYLICON       TAKE these medications    alum & mag hydroxide-simeth 200-200-20 MG/5ML suspension Commonly known as: MAALOX/MYLANTA Take 30 mLs by mouth every 4 (four) hours as needed for indigestion.   HYDROmorphone 2 MG tablet Commonly known as: DILAUDID Take 1 tablet (2 mg total) by mouth every 4 (four) hours as needed for severe pain. What changed: Another medication with the same name was removed. Continue taking this medication, and follow the directions you see here.   metoCLOPramide 10 MG tablet Commonly known as: REGLAN Take 1 tablet (10 mg total) by mouth 4 (four) times daily -  before meals and at bedtime.   pantoprazole 40 MG tablet Commonly known as: PROTONIX Take 1 tablet (40 mg total) by mouth 2 (two) times daily. What changed: when  to take this   sucralfate 1 g tablet Commonly known as: CARAFATE Take 1 tablet (1 g total) by mouth 4 (four) times daily -  with meals and at bedtime.         Signed: Hermina Staggers 12/26/2022, 8:16 AM

## 2022-12-28 ENCOUNTER — Other Ambulatory Visit: Payer: Self-pay

## 2022-12-28 ENCOUNTER — Encounter (HOSPITAL_COMMUNITY): Payer: Self-pay | Admitting: *Deleted

## 2022-12-28 ENCOUNTER — Emergency Department (HOSPITAL_COMMUNITY): Payer: 59

## 2022-12-28 ENCOUNTER — Inpatient Hospital Stay (HOSPITAL_COMMUNITY)
Admission: EM | Admit: 2022-12-28 | Discharge: 2023-01-01 | DRG: 690 | Disposition: A | Discharge status: Home / Self Care | Payer: 59 | Attending: Internal Medicine | Admitting: Internal Medicine

## 2022-12-28 DIAGNOSIS — Z8249 Family history of ischemic heart disease and other diseases of the circulatory system: Secondary | ICD-10-CM

## 2022-12-28 DIAGNOSIS — Z8632 Personal history of gestational diabetes: Secondary | ICD-10-CM

## 2022-12-28 DIAGNOSIS — Z800 Family history of malignant neoplasm of digestive organs: Secondary | ICD-10-CM

## 2022-12-28 DIAGNOSIS — B9689 Other specified bacterial agents as the cause of diseases classified elsewhere: Secondary | ICD-10-CM | POA: Diagnosis present

## 2022-12-28 DIAGNOSIS — Z823 Family history of stroke: Secondary | ICD-10-CM

## 2022-12-28 DIAGNOSIS — N390 Urinary tract infection, site not specified: Secondary | ICD-10-CM | POA: Diagnosis not present

## 2022-12-28 DIAGNOSIS — Z6834 Body mass index (BMI) 34.0-34.9, adult: Secondary | ICD-10-CM

## 2022-12-28 DIAGNOSIS — E669 Obesity, unspecified: Secondary | ICD-10-CM | POA: Diagnosis present

## 2022-12-28 DIAGNOSIS — Z9889 Other specified postprocedural states: Secondary | ICD-10-CM

## 2022-12-28 DIAGNOSIS — R109 Unspecified abdominal pain: Secondary | ICD-10-CM | POA: Diagnosis not present

## 2022-12-28 DIAGNOSIS — K269 Duodenal ulcer, unspecified as acute or chronic, without hemorrhage or perforation: Secondary | ICD-10-CM | POA: Diagnosis present

## 2022-12-28 DIAGNOSIS — N99820 Postprocedural hemorrhage and hematoma of a genitourinary system organ or structure following a genitourinary system procedure: Secondary | ICD-10-CM | POA: Diagnosis not present

## 2022-12-28 DIAGNOSIS — Z1152 Encounter for screening for COVID-19: Secondary | ICD-10-CM

## 2022-12-28 DIAGNOSIS — A419 Sepsis, unspecified organism: Secondary | ICD-10-CM | POA: Diagnosis not present

## 2022-12-28 DIAGNOSIS — S3991XA Unspecified injury of abdomen, initial encounter: Secondary | ICD-10-CM | POA: Diagnosis not present

## 2022-12-28 DIAGNOSIS — R102 Pelvic and perineal pain: Secondary | ICD-10-CM | POA: Diagnosis present

## 2022-12-28 DIAGNOSIS — R000 Tachycardia, unspecified: Secondary | ICD-10-CM | POA: Diagnosis not present

## 2022-12-28 DIAGNOSIS — Z90710 Acquired absence of both cervix and uterus: Secondary | ICD-10-CM | POA: Diagnosis present

## 2022-12-28 DIAGNOSIS — B9620 Unspecified Escherichia coli [E. coli] as the cause of diseases classified elsewhere: Secondary | ICD-10-CM | POA: Diagnosis present

## 2022-12-28 DIAGNOSIS — Z0389 Encounter for observation for other suspected diseases and conditions ruled out: Secondary | ICD-10-CM | POA: Diagnosis not present

## 2022-12-28 DIAGNOSIS — N898 Other specified noninflammatory disorders of vagina: Secondary | ICD-10-CM | POA: Diagnosis present

## 2022-12-28 DIAGNOSIS — Y836 Removal of other organ (partial) (total) as the cause of abnormal reaction of the patient, or of later complication, without mention of misadventure at the time of the procedure: Secondary | ICD-10-CM | POA: Diagnosis present

## 2022-12-28 DIAGNOSIS — Z90722 Acquired absence of ovaries, bilateral: Secondary | ICD-10-CM

## 2022-12-28 DIAGNOSIS — Z87448 Personal history of other diseases of urinary system: Secondary | ICD-10-CM

## 2022-12-28 DIAGNOSIS — Z8669 Personal history of other diseases of the nervous system and sense organs: Secondary | ICD-10-CM

## 2022-12-28 DIAGNOSIS — Z87440 Personal history of urinary (tract) infections: Secondary | ICD-10-CM

## 2022-12-28 DIAGNOSIS — E876 Hypokalemia: Secondary | ICD-10-CM | POA: Diagnosis present

## 2022-12-28 DIAGNOSIS — N9489 Other specified conditions associated with female genital organs and menstrual cycle: Secondary | ICD-10-CM | POA: Diagnosis present

## 2022-12-28 DIAGNOSIS — N99840 Postprocedural hematoma of a genitourinary system organ or structure following a genitourinary system procedure: Secondary | ICD-10-CM | POA: Diagnosis present

## 2022-12-28 DIAGNOSIS — Z9851 Tubal ligation status: Secondary | ICD-10-CM

## 2022-12-28 DIAGNOSIS — I739 Peripheral vascular disease, unspecified: Secondary | ICD-10-CM | POA: Diagnosis present

## 2022-12-28 DIAGNOSIS — Z8619 Personal history of other infectious and parasitic diseases: Secondary | ICD-10-CM

## 2022-12-28 DIAGNOSIS — Z833 Family history of diabetes mellitus: Secondary | ICD-10-CM

## 2022-12-28 DIAGNOSIS — F17210 Nicotine dependence, cigarettes, uncomplicated: Secondary | ICD-10-CM | POA: Diagnosis present

## 2022-12-28 DIAGNOSIS — K661 Hemoperitoneum: Secondary | ICD-10-CM | POA: Diagnosis not present

## 2022-12-28 DIAGNOSIS — G43909 Migraine, unspecified, not intractable, without status migrainosus: Secondary | ICD-10-CM | POA: Diagnosis present

## 2022-12-28 DIAGNOSIS — C539 Malignant neoplasm of cervix uteri, unspecified: Secondary | ICD-10-CM | POA: Diagnosis present

## 2022-12-28 LAB — COMPREHENSIVE METABOLIC PANEL
ALT: 20 U/L (ref 0–44)
AST: 24 U/L (ref 15–41)
Albumin: 3.4 g/dL — ABNORMAL LOW (ref 3.5–5.0)
Alkaline Phosphatase: 63 U/L (ref 38–126)
Anion gap: 12 (ref 5–15)
BUN: 6 mg/dL (ref 6–20)
CO2: 20 mmol/L — ABNORMAL LOW (ref 22–32)
Calcium: 9 mg/dL (ref 8.9–10.3)
Chloride: 100 mmol/L (ref 98–111)
Creatinine, Ser: 1.13 mg/dL — ABNORMAL HIGH (ref 0.44–1.00)
GFR, Estimated: >60 mL/min (ref >60)
Glucose, Bld: 160 mg/dL — ABNORMAL HIGH (ref 70–99)
Potassium: 3.4 mmol/L — ABNORMAL LOW (ref 3.5–5.1)
Sodium: 132 mmol/L — ABNORMAL LOW (ref 135–145)
Total Bilirubin: 1.4 mg/dL — ABNORMAL HIGH (ref 0.3–1.2)
Total Protein: 6.5 g/dL (ref 6.5–8.1)

## 2022-12-28 LAB — APTT: aPTT: 32 s (ref 24–36)

## 2022-12-28 LAB — CBC
HCT: 33.9 % — ABNORMAL LOW (ref 36.0–46.0)
Hemoglobin: 11 g/dL — ABNORMAL LOW (ref 12.0–15.0)
MCH: 29.5 pg (ref 26.0–34.0)
MCHC: 32.4 g/dL (ref 30.0–36.0)
MCV: 90.9 fL (ref 80.0–100.0)
Platelets: 561 10*3/uL — ABNORMAL HIGH (ref 150–400)
RBC: 3.73 MIL/uL — ABNORMAL LOW (ref 3.87–5.11)
RDW: 13.1 % (ref 11.5–15.5)
WBC: 20 10*3/uL — ABNORMAL HIGH (ref 4.0–10.5)
nRBC: 0 % (ref 0.0–0.2)

## 2022-12-28 LAB — URINALYSIS, W/ REFLEX TO CULTURE (INFECTION SUSPECTED)
Bilirubin Urine: NEGATIVE
Glucose, UA: NEGATIVE mg/dL
Ketones, ur: 5 mg/dL — AB
Nitrite: POSITIVE — AB
Protein, ur: 30 mg/dL — AB
Specific Gravity, Urine: 1.018 (ref 1.005–1.030)
WBC, UA: >50 WBC/hpf (ref 0–5)
pH: 5 (ref 5.0–8.0)

## 2022-12-28 LAB — PROTIME-INR
INR: 1 (ref 0.8–1.2)
Prothrombin Time: 13.8 s (ref 11.4–15.2)

## 2022-12-28 LAB — RESP PANEL BY RT-PCR (RSV, FLU A&B, COVID)  RVPGX2
Influenza A by PCR: NEGATIVE
Influenza B by PCR: NEGATIVE
Resp Syncytial Virus by PCR: NEGATIVE
SARS Coronavirus 2 by RT PCR: NEGATIVE

## 2022-12-28 LAB — LIPASE, BLOOD: Lipase: 21 U/L (ref 11–51)

## 2022-12-28 LAB — I-STAT CG4 LACTIC ACID, ED: Lactic Acid, Venous: 1.2 mmol/L (ref 0.5–1.9)

## 2022-12-28 MED ORDER — SODIUM CHLORIDE 0.9 % IV SOLN
1.0000 g | Freq: Once | INTRAVENOUS | Status: AC
  Administered 2022-12-28: 1 g via INTRAVENOUS
  Filled 2022-12-28: qty 10

## 2022-12-28 MED ORDER — LACTATED RINGERS IV BOLUS (SEPSIS)
1000.0000 mL | Freq: Once | INTRAVENOUS | Status: AC
  Administered 2022-12-28: 1000 mL via INTRAVENOUS

## 2022-12-28 MED ORDER — HYDROMORPHONE HCL 1 MG/ML IJ SOLN
0.5000 mg | Freq: Once | INTRAMUSCULAR | Status: AC
  Administered 2022-12-28: 0.5 mg via INTRAVENOUS
  Filled 2022-12-28: qty 1

## 2022-12-28 MED ORDER — IOHEXOL 350 MG/ML SOLN
75.0000 mL | Freq: Once | INTRAVENOUS | Status: AC | PRN
  Administered 2022-12-28: 75 mL via INTRAVENOUS

## 2022-12-28 NOTE — Consult Note (Signed)
OBSTETRICS AND GYNECOLOGY ATTENDING CONSULT NOTE  Consult Date: 12/29/2022 Reason for Consult: Evaluate vaginal cuff due to vaginal bleeding in setting of known pelvic hematoma s/p vaginal hysterectomy on 12/16/22 Consulting Provider: Dr. Alvino Blood, ED Physician    Assessment/Plan: No signs of vaginal cuff dehiscence of active bleeding; vaginal bleeding is from known pelvic hematoma coming in between stitches. CT scan showed that hematoma is slowly decreasing in size, currently measures 7.2 cm in greatest dimension down from 9.4 cm on 12/22/22, consistent with the old vaginal bleeding/drainage seen on examination. No active GYN intervention needed at this point. Patient has UTI, already being treated with Rocephin.  Urine culture will need to be followed up. If she is going home, I would  recommend addition of  an antibiotic such as Clindamycin or Flagyl to her UTI outpatient regimen (something with anaerobic coverage - could also treat both issues with Augmentin) to help with prophylaxis against infection of the cuff (no erythema seen on exam, just concerned about the foul-smelling fluid collection on top of the cuff). Seven day course of Augmentin 875/125 mg  po x bid should be sufficient, will defer final antibiotic choice to the ED team.  She needs to continue prescribed pain medication and  antibiotics.  She already has a follow up appointment in office with her surgeon, Dr. Alysia Penna on 01/07/23. She can contact office or come back to ER for any worsening or concerning symptoms.   Appreciate care of Holly Brown by the ED team Please call [phone] [facility] GYN Consult Attending Monday-Friday 8am - 5pm) or [phone] [facility] GYN Attending On Call all day, every day) for any gynecologic concerns at any time.  Thank you for involving [place] in the care of this patient.  Total consultation time including face-to-face time with patient (>50% of time), reviewing chart and documentation: 60  minutes  Jaynie Collins, MD, FACOG Obstetrician & Gynecologist, [facility] for [employer], [facility]    History of Present Illness: Holly Brown is an 37 y.o. [id number] female s/p TVH on 12/16/22 complicated by 9.4 cm pelvic hematoma, postoperative gastric ulcer, here today with report of continued vaginal bleeding and pain. Of note, patient has been admitted twice after her hysterectomy for the pelvic hematoma, and then for the gastric ulcer.  She also had N/V, AKI during these admissions.   Her pathology was unfortunately remarkable for cervical cancer. On evaluation in the ED today, her presentation was initially concerning for urosepsis given her vitals signs that showed tachycardia (no fevers), WBC 20 and UA that showed +nitrites, moderate LE, many bacteria and she was started on Rocephin 1 g IV q12h.  However, lactic acid returned at 1.2.  Examination revealed some ongoing vaginal bleeding which is expected given her recent postoperative state and her known hematoma and her hemoglobin was stable at 11.0, but we were consulted to make sure the vaginal cuff was intact.  Last CT scan on 12/22/22 showed mild decrease in size of hematoma to 9.4 cm x 4.0 cm from 9.9 x 6.2 cm.   On examination by ER physician, he was unable to visualize cuff well due to bleeding, but no bowel or other concerning signs were noted. On encounter with the patient, she reports continued lower abdominal pain and did not consent for examination without premedication. She had received Dilaudid 0.5 mg IV which helped reduce her pain from 10 to 8, so Dilaudid 1 mg IV was ordered.  Patient has  been ordered for repeat CT scan while here in the ED, this is yet to be performed.  Denies any fevers, chills, sweats, dizziness, chest pain, SOB, dysuria, nausea, vomiting, other GI or GU symptoms or other general symptoms.  Pertinent OB/GYN History: Patient has had a hysterectomy, pathology remarkable  for cervical cancer.  OB History  Gravida Para Term Preterm AB Living  5 4 3 1 1 4   SAB IAB Ectopic Multiple Live Births  1 0 0 0 4    # Outcome Date GA Lbr Len/2nd Weight Sex Type Anes PTL Lv  5 Term 08/31/18 [redacted]w[redacted]d 03:39 3164 g F Vag-Spont None  LIV     Name: Holly Brown, Holly Brown     Apgar1: 9  Apgar5: 9  4 Term 11/08/12 [redacted]w[redacted]d  3532 g M Vag-Spont None  LIV     Birth Comments: No complications during pregnancy or delivery.  No special care nursery, went home with mother after 2 days.     Name: Holly Brown, Holly Brown     Apgar1: 9  Apgar5: 9  3 Preterm 08/04/09 [redacted]w[redacted]d  3345 g Genella Mech  Y LIV  2 Term 10/02/08    F Vag-Spont   LIV  1 SAB             Patient Active Problem List   Diagnosis Date Noted   Cervical cancer (HCC) 12/29/2022   UTI (urinary tract infection) 12/29/2022   Postoperative vaginal bleeding following genitourinary procedure 12/29/2022   Duodenal ulcer 12/24/2022   Left upper quadrant abdominal pain 12/23/2022   Postoperative pain 12/22/2022   Nausea and vomiting 12/19/2022   S/P hysterectomy 12/19/2022   AKI (acute kidney injury) (HCC) 12/19/2022   Pelvic hematoma in female 12/16/2022   History of syncope 12/10/2022   Neuralgia 12/10/2022   Left arm numbness 12/10/2022   Chronic pain of left knee 12/10/2022   Abnormal uterine bleeding (AUB) 09/29/2022   Abnormal Pap smear of cervix 09/29/2022   Family history of colon cancer 10/10/2015    Past Medical History:  Diagnosis Date   Gestational diabetes    metformin - resolved after delivery   History of UTI    HSV (herpes simplex virus) infection    Migraines    Peripheral vascular disease (HCC)    Post-operative nausea and vomiting 12/19/2022   Seizures (HCC)    Jan.2016 - only had one seizure, none since   Vaginal Pap smear, abnormal     Past Surgical History:  Procedure Laterality Date   DILATION AND CURETTAGE OF UTERUS     LAPAROSCOPIC TUBAL LIGATION Bilateral 12/08/2018   Procedure:  LAPAROSCOPIC TUBAL LIGATION;  Surgeon: Catalina Antigua, MD;  Location: [facility];  Service: Gynecology;  Laterality: Bilateral;   LEEP N/A 03/18/2022   Procedure: LOOP ELECTROSURGICAL EXCISION PROCEDURE (LEEP);  Surgeon: Catalina Antigua, MD;  Location: [facility] OR;  Service: Gynecology;  Laterality: N/A;   VAGINAL HYSTERECTOMY N/A 12/16/2022   Procedure: TOTAL VAGINAL HYSTERECTOMY, BILATERAL SALPINGECTOMY;  Surgeon: Hermina Staggers, MD;  Location: [facility] OR;  Service: Gynecology;  Laterality: N/A;   WISDOM TOOTH EXTRACTION  04/21/2009    Family History  Problem Relation Age of Onset   Diabetes Mother    Stroke Mother    Colon cancer Father 67   Hypertension Maternal Grandmother    Diabetes Maternal Grandmother    Hypertension Paternal Grandmother     Social History:  reports that she has quit smoking. Her smoking use included cigarettes. She started smoking about 11  years ago. She has a 2.8 pack-year smoking history. She has never been exposed to tobacco smoke. She has never used smokeless tobacco. She reports that she does not currently use alcohol. She reports that she does not currently use drugs after having used the following drugs: Marijuana.  Allergies: No Known Allergies  Medications: I have reviewed the patient's current medications. Prior to Admission: (Not in a hospital admission)  Scheduled: Continuous: PRN: Anti-infectives (From admission, onward)    Start     Dose/Rate Route Frequency Ordered Stop   12/28/22 2245  cefTRIAXone (ROCEPHIN) 1 g in sodium chloride 0.9 % 100 mL IVPB        1 g 200 mL/hr over 30 Minutes Intravenous  Once 12/28/22 2241 12/29/22 0009       Review of Systems: Pertinent items noted in HPI and remainder of comprehensive ROS otherwise negative.  Focused Physical Examination: BP 121/64   Pulse (!) 104   Temp 98.7 F (37.1 C)   Resp 16   Ht 5\' 10"  (1.778 m)   Wt 108.9 kg   LMP  (LMP Unknown) Comment: Depo Injections - last  injection 10/2022, had spotting on and off since 10/2022 injection  SpO2 100%   BMI 34.45 kg/m  CONSTITUTIONAL: Well-developed, well-nourished female in no acute distress.  CARDIOVASCULAR: Normal heart rate noted, regular rhythm RESPIRATORY: Effort and breath sounds normal, no problems with respiration noted. ABDOMEN: Soft, normal bowel sounds, no distention noted. Lower abdominal moderate tenderness, no rebound or guarding.  PELVIC: Normal appearing external genitalia; normal appearing vaginal mucosa and healing vaginal cuff. Vaginal cuff is intact, no erythema noted. Red-brown, foul smelling serosanguinous drainage noted coming in between stitches (from known hematoma), no active bleeding noted. Done in the presence of a chaperone. MUSCULOSKELETAL: Normal range of motion. No tenderness.  No cyanosis, clubbing, or edema.  2+ distal pulses.  Labs and Imaging: Results for orders placed or performed during the hospital encounter of 12/28/22 (from the past 72 hour(s))  Lipase, blood     Status: None   Collection Time: 12/28/22  5:38 PM  Result Value Ref Range   Lipase 21 11 - 51 U/L    Comment: Performed at [facility], [address], [place], [place] [address]  Comprehensive metabolic panel     Status: Abnormal   Collection Time: 12/28/22  5:38 PM  Result Value Ref Range   Sodium 132 (L) 135 - 145 mmol/L   Potassium 3.4 (L) 3.5 - 5.1 mmol/L   Chloride 100 98 - 111 mmol/L   CO2 20 (L) 22 - 32 mmol/L   Glucose, Bld 160 (H) 70 - 99 mg/dL    Comment: Glucose reference range applies only to samples taken after fasting for at least 8 hours.   BUN 6 6 - 20 mg/dL   Creatinine, Ser 6.04 (H) 0.44 - 1.00 mg/dL   Calcium 9.0 8.9 - 54.0 mg/dL   Total Protein 6.5 6.5 - 8.1 g/dL   Albumin 3.4 (L) 3.5 - 5.0 g/dL   AST 24 15 - 41 U/L   ALT 20 0 - 44 U/L   Alkaline Phosphatase 63 38 - 126 U/L   Total Bilirubin 1.4 (H) 0.3 - 1.2 mg/dL   GFR, Estimated >98 >11 mL/min    Comment:  (NOTE) Calculated using the CKD-EPI Creatinine Equation (2021)    Anion gap 12 5 - 15    Comment: Performed at [facility], [address] [place], [place] [address]  CBC     Status: Abnormal   Collection Time: 12/28/22  5:38 PM  Result Value Ref Range   WBC 20.0 (H) 4.0 - 10.5 K/uL   RBC 3.73 (L) 3.87 - 5.11 MIL/uL   Hemoglobin 11.0 (L) 12.0 - 15.0 g/dL   HCT 64.4 (L) 03.4 - 74.2 %   MCV 90.9 80.0 - 100.0 fL   MCH 29.5 26.0 - 34.0 pg   MCHC 32.4 30.0 - 36.0 g/dL   RDW 59.5 63.8 - 75.6 %   Platelets 561 (H) 150 - 400 K/uL   nRBC 0.0 0.0 - 0.2 %    Comment: Performed at [facility], [address] [place], [place] [address]  Resp panel by RT-PCR (RSV, Flu A&B, Covid) Anterior Nasal Swab     Status: None   Collection Time: 12/28/22  9:52 PM   Specimen: Anterior Nasal Swab  Result Value Ref Range   SARS Coronavirus 2 by RT PCR NEGATIVE NEGATIVE   Influenza A by PCR NEGATIVE NEGATIVE   Influenza B by PCR NEGATIVE NEGATIVE    Comment: (NOTE) The Xpert Xpress SARS-CoV-2/FLU/RSV plus assay is intended as an aid in the diagnosis of influenza from Nasopharyngeal swab specimens and should not be used as a sole basis for treatment. Nasal washings and aspirates are unacceptable for Xpert Xpress SARS-CoV-2/FLU/RSV testing.  Fact Sheet for Patients: BloggerCourse.com  Fact Sheet for Healthcare Providers: SeriousBroker.it  This test is not yet approved or cleared by the [place] FDA and has been authorized for detection and/or diagnosis of SARS-CoV-2 by FDA under an Emergency Use Authorization (EUA). This EUA will remain in effect (meaning this test can be used) for the duration of the COVID-19 declaration under Section 564(b)(1) of the Act, 21 U.S.C. section 360bbb-3(b)(1), unless the authorization is terminated or revoked.     Resp Syncytial Virus by PCR NEGATIVE NEGATIVE    Comment: (NOTE) Fact Sheet for  Patients: BloggerCourse.com  Fact Sheet for Healthcare Providers: SeriousBroker.it  This test is not yet approved or cleared by the [place] FDA and has been authorized for detection and/or diagnosis of SARS-CoV-2 by FDA under an Emergency Use Authorization (EUA). This EUA will remain in effect (meaning this test can be used) for the duration of the COVID-19 declaration under Section 564(b)(1) of the Act, 21 U.S.C. section 360bbb-3(b)(1), unless the authorization is terminated or revoked.  Performed at [facility], [address], [place], [place] [address]   Urinalysis, w/ Reflex to Culture (Infection Suspected) -Urine, Clean Catch     Status: Abnormal   Collection Time: 12/28/22  9:53 PM  Result Value Ref Range   Specimen Source URINE, CLEAN CATCH    Color, Urine AMBER (A) YELLOW    Comment: BIOCHEMICALS MAY BE AFFECTED BY COLOR   APPearance CLOUDY (A) CLEAR   Specific Gravity, Urine 1.018 1.005 - 1.030   pH 5.0 5.0 - 8.0   Glucose, UA NEGATIVE NEGATIVE mg/dL   Hgb urine dipstick MODERATE (A) NEGATIVE   Bilirubin Urine NEGATIVE NEGATIVE   Ketones, ur 5 (A) NEGATIVE mg/dL   Protein, ur 30 (A) NEGATIVE mg/dL   Nitrite POSITIVE (A) NEGATIVE   Leukocytes,Ua MODERATE (A) NEGATIVE   RBC / HPF 0-5 0 - 5 RBC/hpf   WBC, UA >50 0 - 5 WBC/hpf    Comment:        Reflex urine culture not performed if WBC <=10, OR if Squamous epithelial cells >5. If Squamous epithelial cells >5  suggest recollection.    Bacteria, UA MANY (A) NONE SEEN   Squamous Epithelial / HPF 0-5 0 - 5 /HPF   Mucus PRESENT     Comment: Performed at [facility], [address] [place], [place] [address]  Protime-INR     Status: None   Collection Time: 12/28/22 10:48 PM  Result Value Ref Range   Prothrombin Time 13.8 11.4 - 15.2 seconds   INR 1.0 0.8 - 1.2    Comment: (NOTE) INR goal varies based on device and disease states. Performed at  [facility], [address], [place], [place] [address]   APTT     Status: None   Collection Time: 12/28/22 10:48 PM  Result Value Ref Range   aPTT 32 24 - 36 seconds    Comment: Performed at [facility], [address] [place], [place] [address]  I-Stat Lactic Acid, ED     Status: None   Collection Time: 12/28/22 10:57 PM  Result Value Ref Range   Lactic Acid, Venous 1.2 0.5 - 1.9 mmol/L    CT ABDOMEN PELVIS W CONTRAST  Addendum Date: 12/29/2022   ADDENDUM REPORT: 12/29/2022 01:22 ADDENDUM: Delayed images were obtained which demonstrate filling of the mid ureters and bladder but no extravasated contrast to indicated urine leak. Electronically Signed   By: Helyn Numbers M.D.   On: 12/29/2022 01:22   Result Date: 12/29/2022 CLINICAL DATA:  Blunt abdominal trauma, persistent abdominal pain following vaginal hysterectomy 12/16/2022, now with vaginal bleeding. EXAM: CT ABDOMEN AND PELVIS WITH CONTRAST TECHNIQUE: Multidetector CT imaging of the abdomen and pelvis was performed using the standard protocol following bolus administration of intravenous contrast. Delayed images were attempted, but refused by the patient. RADIATION DOSE REDUCTION: This exam was performed according to the departmental dose-optimization program which includes automated exposure control, adjustment of the mA and/or kV according to patient size and/or use of iterative reconstruction technique. CONTRAST:  75mL OMNIPAQUE IOHEXOL 350 MG/ML SOLN COMPARISON:  12/22/2022 FINDINGS: Lower chest: No acute abnormality. Hepatobiliary: There is focal fatty hepatic infiltration at the falciform ligament. Liver is otherwise unremarkable. Gallbladder unremarkable. No intra or extrahepatic biliary ductal dilation. A small amount of fluid is seen within Morison's pouch, new since prior examination. Pancreas: Unremarkable Spleen: The spleen is normal in size. No intrasplenic lesions are seen. Small perisplenic high attenuation fluid  is present in keeping with mild hemoperitoneum, new since prior examination. Adrenals/Urinary Tract: The adrenal glands are unremarkable. The kidneys are normal in size and position demonstrate normal cortical enhancement. No hydronephrosis. No intrarenal or ureteral calculi. There is infiltrative change within the peritoneum superior to the dome of the bladder, however, the bladder itself is unremarkable. The distal ureters are obscured by extensive fluid inflammatory change within the pelvis. Delayed images were not obtained and continuity of the ureters is not definitively confirmed on this examination though extravasation was not identified on prior exam. Stomach/Bowel: A complex lobulated fluid collection has developed within the pelvis measuring at least 7.2 x 7.6 cm at axial image # 75/3 demonstrating incomplete loculation. Collection is immediately superior to the vaginal cuff and may represent a subacute to chronic hematoma given the high attenuation fluid noted on prior examination in this region. There is new infiltration within the omentum anterior to this collection as well as a small sentinel clot at axial image # 77/3 which may reflect changes of recurrent hemorrhage. The stomach, small bowel, and large bowel are unremarkable. The mid rectum  is difficult to discern discretely from the adjacent complex fluid collection within the pelvis, but is otherwise unremarkable. No free intraperitoneal gas. Vascular/Lymphatic: No significant vascular findings are present. No enlarged abdominal or pelvic lymph nodes. Reproductive: Status post hysterectomy. Residual ovaries are unremarkable. The ovarian veins are patent bilaterally. Other: No abdominal wall hernia. Musculoskeletal: No acute or significant osseous findings. IMPRESSION: 1. Interval development of a complex lobulated fluid collection within the pelvis measuring at least 7.2 x 7.6 cm, immediately superior to the vaginal cuff. This may represent a  subacute to chronic hematoma given the high attenuation fluid noted on prior examination in this region. There is new infiltration within the omentum anterior to this collection as well as a small sentinel clot which may reflect changes of recurrent hemorrhage in this region. 2. Interval development of mild hemoperitoneum within Morison's pouch and perisplenic space. 3. The distal ureters are obscured by extensive fluid inflammatory change within the pelvis. Delayed images were not obtained and continuity of the ureters is not definitively confirmed on this examination though extravasation was not identified on prior exam. Electronically Signed: By: Helyn Numbers M.D. On: 12/29/2022 00:42   DG Chest Port 1 View  Result Date: 12/28/2022 CLINICAL DATA:  Questionable sepsis - evaluate for abnormality EXAM: PORTABLE CHEST 1 VIEW COMPARISON:  Chest x-ray 05/18/2013 FINDINGS: The heart and mediastinal contours are within normal limits. No focal consolidation. No pulmonary edema. No pleural effusion. No pneumothorax. No acute osseous abnormality. Indeterminate metallic density measuring up to 1.3 cm overlying the expected region of the distal esophagus. IMPRESSION: 1. Indeterminate metallic density measuring up to 1.3 cm overlying the expected region of the distal esophagus. Correlate with clinical history. Consider repeat PA and lateral view of the chest. 2. Otherwise no acute cardiopulmonary abnormality. Electronically Signed   By: Tish Frederickson M.D.   On: 12/28/2022 21:12

## 2022-12-28 NOTE — ED Provider Notes (Signed)
  Provider Note MRN:  [id number]  Arrival date & time: 12/29/22    ED Course and Medical Decision Making  Assumed care from Dr Suezanne Jacquet at shift change.  See note from prior team for complete details, in brief:   37 yo female Recent hysterectomy (v) Presents today 2/2 vaginal bleeding  WBC 20, Hgb 11 (similar to prior) Vaginal cuff still bleeding Given rocephin OBGYN to eval CT pending   Plan per prior physician f//u CT and OBGYN   OBGYN has evaluated, no acute intervention, abx and f/u with OBGYN clinic  OBGYN recommends adding Flagyl or clindamycin to Rocephin for anaerobic coverage; will initiate Flagyl (per Dr Macon Large)  Patient continues to have significant abdominal pain, requiring multiple doses of parenteral analgesics.  Feels her home pain regimen is not adequately treating her discomfort.  Will plan admission for intractable abdominal pain, urosepsis.     .Critical Care  Performed by: Sloan Leiter, DO Authorized by: Sloan Leiter, DO   Critical care provider statement:    Critical care time (minutes):  32   Critical care time was exclusive of:  Separately billable procedures and treating other patients   Critical care was necessary to treat or prevent imminent or life-threatening deterioration of the following conditions:  Sepsis   Critical care was time spent personally by me on the following activities:  Development of treatment plan with patient or surrogate, discussions with consultants, evaluation of patient's response to treatment, examination of patient, ordering and review of laboratory studies, ordering and review of radiographic studies, ordering and performing treatments and interventions, pulse oximetry, re-evaluation of patient's condition, review of old charts and obtaining history from patient or surrogate   Care discussed with: admitting provider     Final Clinical Impressions(s) / ED Diagnoses     ICD-10-CM   1. Urinary tract infection without  hematuria, site unspecified  N39.0     2. Postoperative vaginal bleeding following genitourinary procedure  N99.820     3. Intractable abdominal pain  R10.9     4. Sepsis, due to unspecified organism, unspecified whether acute organ dysfunction present [facility])  A41.9       ED Discharge Orders     None       Discharge Instructions   None        Sloan Leiter, DO 12/29/22 [phone]

## 2022-12-28 NOTE — ED Triage Notes (Signed)
The pt had a vaginal hysterectomy  aug 27  she has been and out of the hospital since with abd/ pain constipated  but today she woke up in a pool of blood with clots abd pain still

## 2022-12-28 NOTE — ED Notes (Signed)
Attempted x2 to draw labs and blood cultures. Enough blood obtained for blue top blood culture only. Send to lab. Asked EMT to draw labs per order. IV in place x2.

## 2022-12-28 NOTE — Sepsis Progress Note (Signed)
Current MD at the time was sent message regarding sepsis protocol and need for Abx's, message was recognized , no Abx's noted on [facility]

## 2022-12-28 NOTE — ED Provider Notes (Signed)
[place] EMERGENCY DEPARTMENT AT [facility] Provider Note  CSN: [id number] Arrival date & time: 12/28/22 1716  Chief Complaint(s) Vaginal Bleeding  HPI Holly Brown is a 37 y.o. female history of peripheral vascular disease, IV vaginal bleeding status post hysterectomy presenting with vaginal bleeding and discharge.  She also reports some lower abdominal pain.  She was recently hospitalized was found to have a lower abdominal hematoma.  She denies any urinary symptoms, fevers or chills, nausea or vomiting.  No chest pain or shortness of breath.  No lightheadedness or dizziness.  No syncope.   Past Medical History Past Medical History:  Diagnosis Date   Gestational diabetes    metformin - resolved after delivery   History of UTI    HSV (herpes simplex virus) infection    Migraines    Peripheral vascular disease (HCC)    Post-operative nausea and vomiting 12/19/2022   Seizures (HCC)    Jan.2016 - only had one seizure, none since   Vaginal Pap smear, abnormal    Patient Active Problem List   Diagnosis Date Noted   Duodenal ulcer 12/24/2022   Left upper quadrant abdominal pain 12/23/2022   Postoperative pain 12/22/2022   Nausea and vomiting 12/19/2022   S/P hysterectomy 12/19/2022   AKI (acute kidney injury) (HCC) 12/19/2022   Post-operative state 12/16/2022   History of syncope 12/10/2022   Neuralgia 12/10/2022   Left arm numbness 12/10/2022   Chronic pain of left knee 12/10/2022   Abnormal uterine bleeding (AUB) 09/29/2022   Abnormal Pap smear of cervix 09/29/2022   Family history of colon cancer 10/10/2015   Home Medication(s) Prior to Admission medications   Medication Sig Start Date End Date Taking? Authorizing Provider  alum & mag hydroxide-simeth (MAALOX/MYLANTA) 200-200-20 MG/5ML suspension Take 30 mLs by mouth every 4 (four) hours as needed for indigestion. 12/22/22   Lennart Pall, MD  HYDROmorphone (DILAUDID) 2 MG tablet Take 1 tablet (2 mg  total) by mouth every 4 (four) hours as needed for severe pain. 12/18/22   Hermina Staggers, MD  metoCLOPramide (REGLAN) 10 MG tablet Take 1 tablet (10 mg total) by mouth 4 (four) times daily -  before meals and at bedtime. 12/26/22   Hermina Staggers, MD  pantoprazole (PROTONIX) 40 MG tablet Take 1 tablet (40 mg total) by mouth 2 (two) times daily. 12/26/22   Hermina Staggers, MD  sucralfate (CARAFATE) 1 g tablet Take 1 tablet (1 g total) by mouth 4 (four) times daily -  with meals and at bedtime. 12/26/22   Hermina Staggers, MD                                                                                                                                    Past Surgical History Past Surgical History:  Procedure Laterality Date   DILATION AND CURETTAGE OF UTERUS     LAPAROSCOPIC TUBAL LIGATION  Bilateral 12/08/2018   Procedure: LAPAROSCOPIC TUBAL LIGATION;  Surgeon: Catalina Antigua, MD;  Location: [facility];  Service: Gynecology;  Laterality: Bilateral;   LEEP N/A 03/18/2022   Procedure: LOOP ELECTROSURGICAL EXCISION PROCEDURE (LEEP);  Surgeon: Catalina Antigua, MD;  Location: [facility] OR;  Service: Gynecology;  Laterality: N/A;   VAGINAL HYSTERECTOMY N/A 12/16/2022   Procedure: TOTAL VAGINAL HYSTERECTOMY, BILATERAL SALPINGECTOMY;  Surgeon: Hermina Staggers, MD;  Location: [facility] OR;  Service: Gynecology;  Laterality: N/A;   WISDOM TOOTH EXTRACTION  04/21/2009   Family History Family History  Problem Relation Age of Onset   Diabetes Mother    Stroke Mother    Colon cancer Father 16   Hypertension Maternal Grandmother    Diabetes Maternal Grandmother    Hypertension Paternal Grandmother     Social History Social History   Tobacco Use   Smoking status: Former    Current packs/day: 0.25    Average packs/day: 0.3 packs/day for 11.0 years (2.8 ttl pk-yrs)    Types: Cigarettes    Start date: 12/21/2011    Passive exposure: Never   Smokeless tobacco: Never  Vaping Use   Vaping status:  Never Used  Substance Use Topics   Alcohol use: Not Currently    Comment: ocassionally    Drug use: Not Currently    Types: Marijuana    Comment: Last use was in 2023   Allergies Patient has no known allergies.  Review of Systems Review of Systems  All other systems reviewed and are negative.   Physical Exam Vital Signs  I have reviewed the triage vital signs BP 132/78   Pulse 93   Temp 98.7 F (37.1 C)   Resp 16   Ht 5\' 10"  (1.778 m)   Wt 108.9 kg   LMP  (LMP Unknown) Comment: Depo Injections - last injection 10/2022, had spotting on and off since 10/2022 injection  SpO2 99%   BMI 34.45 kg/m  Physical Exam Vitals and nursing note reviewed.  Constitutional:      General: She is not in acute distress.    Appearance: She is well-developed.  HENT:     Head: Normocephalic and atraumatic.     Mouth/Throat:     Mouth: Mucous membranes are moist.  Eyes:     Pupils: Pupils are equal, round, and reactive to light.  Cardiovascular:     Rate and Rhythm: Normal rate and regular rhythm.     Heart sounds: No murmur heard. Pulmonary:     Effort: Pulmonary effort is normal. No respiratory distress.     Breath sounds: Normal breath sounds.  Abdominal:     General: Abdomen is flat.     Palpations: Abdomen is soft.     Tenderness: There is abdominal tenderness (lower abdomen).  Genitourinary:    Comments: Chaperoned by PA Vernona Rieger, on speculum exam, cuff appears intact with bleeding from cuff site Musculoskeletal:        General: No tenderness.     Right lower leg: No edema.     Left lower leg: No edema.  Skin:    General: Skin is warm and dry.  Neurological:     General: No focal deficit present.     Mental Status: She is alert. Mental status is at baseline.  Psychiatric:        Mood and Affect: Mood normal.        Behavior: Behavior normal.     ED Results and Treatments Labs (all labs ordered are listed,  but only abnormal results are displayed) Labs Reviewed   COMPREHENSIVE METABOLIC PANEL - Abnormal; Notable for the following components:      Result Value   Sodium 132 (*)    Potassium 3.4 (*)    CO2 20 (*)    Glucose, Bld 160 (*)    Creatinine, Ser 1.13 (*)    Albumin 3.4 (*)    Total Bilirubin 1.4 (*)    All other components within normal limits  CBC - Abnormal; Notable for the following components:   WBC 20.0 (*)    RBC 3.73 (*)    Hemoglobin 11.0 (*)    HCT 33.9 (*)    Platelets 561 (*)    All other components within normal limits  URINALYSIS, W/ REFLEX TO CULTURE (INFECTION SUSPECTED) - Abnormal; Notable for the following components:   Color, Urine AMBER (*)    APPearance CLOUDY (*)    Hgb urine dipstick MODERATE (*)    Ketones, ur 5 (*)    Protein, ur 30 (*)    Nitrite POSITIVE (*)    Leukocytes,Ua MODERATE (*)    Bacteria, UA MANY (*)    All other components within normal limits  RESP PANEL BY RT-PCR (RSV, FLU A&B, COVID)  RVPGX2  CULTURE, BLOOD (ROUTINE X 2)  CULTURE, BLOOD (ROUTINE X 2)  URINE CULTURE  LIPASE, BLOOD  PROTIME-INR  APTT  I-STAT CG4 LACTIC ACID, ED                                                                                                                          Radiology DG Chest Port 1 View  Result Date: 12/28/2022 CLINICAL DATA:  Questionable sepsis - evaluate for abnormality EXAM: PORTABLE CHEST 1 VIEW COMPARISON:  Chest x-ray 05/18/2013 FINDINGS: The heart and mediastinal contours are within normal limits. No focal consolidation. No pulmonary edema. No pleural effusion. No pneumothorax. No acute osseous abnormality. Indeterminate metallic density measuring up to 1.3 cm overlying the expected region of the distal esophagus. IMPRESSION: 1. Indeterminate metallic density measuring up to 1.3 cm overlying the expected region of the distal esophagus. Correlate with clinical history. Consider repeat PA and lateral view of the chest. 2. Otherwise no acute cardiopulmonary abnormality. Electronically Signed    By: Tish Frederickson M.D.   On: 12/28/2022 21:12    Pertinent labs & imaging results that were available during my care of the patient were reviewed by me and considered in my medical decision making (see MDM for details).  Medications Ordered in ED Medications  cefTRIAXone (ROCEPHIN) 1 g in sodium chloride 0.9 % 100 mL IVPB (1 g Intravenous New Bag/Given 12/28/22 2306)  lactated ringers bolus 1,000 mL (1,000 mLs Intravenous New Bag/Given 12/28/22 2211)  HYDROmorphone (DILAUDID) injection 0.5 mg (0.5 mg Intravenous Given 12/28/22 2151)  Procedures Procedures  (including critical care time)  Medical Decision Making / ED Course   MDM:  37 year old presenting to the emergency department with vaginal bleeding after hysterectomy.  Patient was activated as a code sepsis by triage RN as she was tachycardic, and borderline fever.  Also with leukocytosis.  Exam with intact vaginal cuff, with some bleeding.  Discussed with Dr.Anywanu who is familiar with the patient will assess.  Given lower abdominal pain and recent surgery will obtain CT abdomen to evaluate for acute intra-abdominal process.  Urinalysis is grossly positive, patient does not really have any urinary symptoms but given degree of findings we will go ahead and treat with ceftriaxone.  Urine culture and blood cultures obtained.  Vital signs have improved with fluids.  Lactate was normal.  Signed out to oncoming provider pending CT scan and OB/GYN consult.  Clinical Course as of 12/28/22 2330  Wynelle Link Dec 28, 2022  2248 Discussed with Dr. Macon Large [WS]    Clinical Course User Index [WS] Suezanne Jacquet Jerilee Field, MD     Additional history obtained: -Additional history obtained from ems -External records from outside source obtained and reviewed including: Chart review including previous notes, labs, imaging,  consultation notes including previous OB notes   Lab Tests: -I ordered, reviewed, and interpreted labs.   The pertinent results include:   Labs Reviewed  COMPREHENSIVE METABOLIC PANEL - Abnormal; Notable for the following components:      Result Value   Sodium 132 (*)    Potassium 3.4 (*)    CO2 20 (*)    Glucose, Bld 160 (*)    Creatinine, Ser 1.13 (*)    Albumin 3.4 (*)    Total Bilirubin 1.4 (*)    All other components within normal limits  CBC - Abnormal; Notable for the following components:   WBC 20.0 (*)    RBC 3.73 (*)    Hemoglobin 11.0 (*)    HCT 33.9 (*)    Platelets 561 (*)    All other components within normal limits  URINALYSIS, W/ REFLEX TO CULTURE (INFECTION SUSPECTED) - Abnormal; Notable for the following components:   Color, Urine AMBER (*)    APPearance CLOUDY (*)    Hgb urine dipstick MODERATE (*)    Ketones, ur 5 (*)    Protein, ur 30 (*)    Nitrite POSITIVE (*)    Leukocytes,Ua MODERATE (*)    Bacteria, UA MANY (*)    All other components within normal limits  RESP PANEL BY RT-PCR (RSV, FLU A&B, COVID)  RVPGX2  CULTURE, BLOOD (ROUTINE X 2)  CULTURE, BLOOD (ROUTINE X 2)  URINE CULTURE  LIPASE, BLOOD  PROTIME-INR  APTT  I-STAT CG4 LACTIC ACID, ED    Notable for signs of UTI, leukocytosis  EKG   EKG Interpretation Date/Time:  Sunday December 28 2022 22:38:21 EDT Ventricular Rate:  99 PR Interval:  145 QRS Duration:  76 QT Interval:  344 QTC Calculation: 442 R Axis:   66  Text Interpretation: Sinus rhythm Confirmed by Alvino Blood ([id number]) on 12/28/2022 10:50:12 PM         Imaging Studies ordered: I ordered imaging studies including CT abdomen Pending currently   Medicines ordered and prescription drug management: Meds ordered this encounter  Medications   lactated ringers bolus 1,000 mL    Order Specific Question:   Reason 30 mL/kg dose is not being ordered    Answer:   First Lactic Acid Pending   HYDROmorphone (DILAUDID)  injection  0.5 mg   cefTRIAXone (ROCEPHIN) 1 g in sodium chloride 0.9 % 100 mL IVPB    Order Specific Question:   Antibiotic Indication:    Answer:   UTI    -I have reviewed the patients home medicines and have made adjustments as needed   Consultations Obtained: I requested consultation with the OB Gyn,  and discussed lab and imaging findings as well as pertinent plan - they recommend: they will see patient   Cardiac Monitoring: The patient was maintained on a cardiac monitor.  I personally viewed and interpreted the cardiac monitored which showed an underlying rhythm of: sinus tachycardia   Social Determinants of Health:  Diagnosis or treatment significantly limited by social determinants of health: obesity   Reevaluation: After the interventions noted above, I reevaluated the patient and found that their symptoms have improved  Co morbidities that complicate the patient evaluation  Past Medical History:  Diagnosis Date   Gestational diabetes    metformin - resolved after delivery   History of UTI    HSV (herpes simplex virus) infection    Migraines    Peripheral vascular disease (HCC)    Post-operative nausea and vomiting 12/19/2022   Seizures (HCC)    Jan.2016 - only had one seizure, none since   Vaginal Pap smear, abnormal       Dispostion: Disposition decision including need for hospitalization was considered, and patient disposition pending at time of sign out.    Final Clinical Impression(s) / ED Diagnoses Final diagnoses:  Urinary tract infection without hematuria, site unspecified  Postoperative vaginal bleeding following genitourinary procedure     This chart was dictated using voice recognition software.  Despite best efforts to proofread,  errors can occur which can change the documentation meaning.    Lonell Grandchild, MD 12/28/22 2330

## 2022-12-28 NOTE — ED Notes (Signed)
MD at bedside to complete pelvic exam

## 2022-12-28 NOTE — Sepsis Progress Note (Signed)
Elink monitoring for sepsis protocol 

## 2022-12-29 ENCOUNTER — Emergency Department (HOSPITAL_COMMUNITY): Payer: 59

## 2022-12-29 ENCOUNTER — Encounter (HOSPITAL_COMMUNITY): Payer: Self-pay | Admitting: Family Medicine

## 2022-12-29 DIAGNOSIS — F17210 Nicotine dependence, cigarettes, uncomplicated: Secondary | ICD-10-CM | POA: Diagnosis present

## 2022-12-29 DIAGNOSIS — N99840 Postprocedural hematoma of a genitourinary system organ or structure following a genitourinary system procedure: Secondary | ICD-10-CM | POA: Diagnosis not present

## 2022-12-29 DIAGNOSIS — R109 Unspecified abdominal pain: Secondary | ICD-10-CM | POA: Diagnosis present

## 2022-12-29 DIAGNOSIS — E669 Obesity, unspecified: Secondary | ICD-10-CM | POA: Diagnosis not present

## 2022-12-29 DIAGNOSIS — N3000 Acute cystitis without hematuria: Secondary | ICD-10-CM

## 2022-12-29 DIAGNOSIS — Z90710 Acquired absence of both cervix and uterus: Secondary | ICD-10-CM

## 2022-12-29 DIAGNOSIS — N99820 Postprocedural hemorrhage and hematoma of a genitourinary system organ or structure following a genitourinary system procedure: Secondary | ICD-10-CM | POA: Diagnosis not present

## 2022-12-29 DIAGNOSIS — K661 Hemoperitoneum: Secondary | ICD-10-CM | POA: Diagnosis not present

## 2022-12-29 DIAGNOSIS — Z8632 Personal history of gestational diabetes: Secondary | ICD-10-CM | POA: Diagnosis not present

## 2022-12-29 DIAGNOSIS — K269 Duodenal ulcer, unspecified as acute or chronic, without hemorrhage or perforation: Secondary | ICD-10-CM | POA: Diagnosis not present

## 2022-12-29 DIAGNOSIS — R102 Pelvic and perineal pain: Secondary | ICD-10-CM | POA: Diagnosis present

## 2022-12-29 DIAGNOSIS — Z833 Family history of diabetes mellitus: Secondary | ICD-10-CM | POA: Diagnosis not present

## 2022-12-29 DIAGNOSIS — B9689 Other specified bacterial agents as the cause of diseases classified elsewhere: Secondary | ICD-10-CM | POA: Diagnosis not present

## 2022-12-29 DIAGNOSIS — Z87440 Personal history of urinary (tract) infections: Secondary | ICD-10-CM | POA: Diagnosis not present

## 2022-12-29 DIAGNOSIS — Z8669 Personal history of other diseases of the nervous system and sense organs: Secondary | ICD-10-CM | POA: Diagnosis not present

## 2022-12-29 DIAGNOSIS — C539 Malignant neoplasm of cervix uteri, unspecified: Secondary | ICD-10-CM | POA: Diagnosis present

## 2022-12-29 DIAGNOSIS — Y836 Removal of other organ (partial) (total) as the cause of abnormal reaction of the patient, or of later complication, without mention of misadventure at the time of the procedure: Secondary | ICD-10-CM | POA: Diagnosis present

## 2022-12-29 DIAGNOSIS — Z1152 Encounter for screening for COVID-19: Secondary | ICD-10-CM | POA: Diagnosis not present

## 2022-12-29 DIAGNOSIS — N898 Other specified noninflammatory disorders of vagina: Secondary | ICD-10-CM | POA: Diagnosis present

## 2022-12-29 DIAGNOSIS — Z800 Family history of malignant neoplasm of digestive organs: Secondary | ICD-10-CM | POA: Diagnosis not present

## 2022-12-29 DIAGNOSIS — N9489 Other specified conditions associated with female genital organs and menstrual cycle: Secondary | ICD-10-CM

## 2022-12-29 DIAGNOSIS — Z823 Family history of stroke: Secondary | ICD-10-CM | POA: Diagnosis not present

## 2022-12-29 DIAGNOSIS — S3991XA Unspecified injury of abdomen, initial encounter: Secondary | ICD-10-CM | POA: Diagnosis not present

## 2022-12-29 DIAGNOSIS — G43909 Migraine, unspecified, not intractable, without status migrainosus: Secondary | ICD-10-CM | POA: Diagnosis present

## 2022-12-29 DIAGNOSIS — Z8249 Family history of ischemic heart disease and other diseases of the circulatory system: Secondary | ICD-10-CM | POA: Diagnosis not present

## 2022-12-29 DIAGNOSIS — B9620 Unspecified Escherichia coli [E. coli] as the cause of diseases classified elsewhere: Secondary | ICD-10-CM | POA: Diagnosis not present

## 2022-12-29 DIAGNOSIS — Z8619 Personal history of other infectious and parasitic diseases: Secondary | ICD-10-CM | POA: Diagnosis not present

## 2022-12-29 DIAGNOSIS — I739 Peripheral vascular disease, unspecified: Secondary | ICD-10-CM | POA: Diagnosis not present

## 2022-12-29 DIAGNOSIS — N390 Urinary tract infection, site not specified: Secondary | ICD-10-CM | POA: Diagnosis present

## 2022-12-29 DIAGNOSIS — E876 Hypokalemia: Secondary | ICD-10-CM | POA: Diagnosis present

## 2022-12-29 LAB — CBC
HCT: 30.6 % — ABNORMAL LOW (ref 36.0–46.0)
Hemoglobin: 9.8 g/dL — ABNORMAL LOW (ref 12.0–15.0)
MCH: 29.7 pg (ref 26.0–34.0)
MCHC: 32 g/dL (ref 30.0–36.0)
MCV: 92.7 fL (ref 80.0–100.0)
Platelets: 478 10*3/uL — ABNORMAL HIGH (ref 150–400)
RBC: 3.3 MIL/uL — ABNORMAL LOW (ref 3.87–5.11)
RDW: 13.1 % (ref 11.5–15.5)
WBC: 20.7 10*3/uL — ABNORMAL HIGH (ref 4.0–10.5)
nRBC: 0 % (ref 0.0–0.2)

## 2022-12-29 LAB — BASIC METABOLIC PANEL
Anion gap: 13 (ref 5–15)
BUN: <5 mg/dL — ABNORMAL LOW (ref 6–20)
CO2: 20 mmol/L — ABNORMAL LOW (ref 22–32)
Calcium: 8.6 mg/dL — ABNORMAL LOW (ref 8.9–10.3)
Chloride: 100 mmol/L (ref 98–111)
Creatinine, Ser: 0.88 mg/dL (ref 0.44–1.00)
GFR, Estimated: >60 mL/min (ref >60)
Glucose, Bld: 139 mg/dL — ABNORMAL HIGH (ref 70–99)
Potassium: 3.4 mmol/L — ABNORMAL LOW (ref 3.5–5.1)
Sodium: 133 mmol/L — ABNORMAL LOW (ref 135–145)

## 2022-12-29 LAB — MAGNESIUM: Magnesium: 1.6 mg/dL — ABNORMAL LOW (ref 1.7–2.4)

## 2022-12-29 MED ORDER — METRONIDAZOLE 500 MG/100ML IV SOLN
500.0000 mg | Freq: Once | INTRAVENOUS | Status: AC
  Administered 2022-12-29: 500 mg via INTRAVENOUS
  Filled 2022-12-29: qty 100

## 2022-12-29 MED ORDER — HYDROMORPHONE HCL 1 MG/ML IJ SOLN
1.0000 mg | Freq: Once | INTRAMUSCULAR | Status: AC
  Administered 2022-12-29: 1 mg via INTRAVENOUS
  Filled 2022-12-29: qty 1

## 2022-12-29 MED ORDER — NALOXONE HCL 0.4 MG/ML IJ SOLN: 0.4000 mg | INTRAMUSCULAR | Status: DC | PRN

## 2022-12-29 MED ORDER — METRONIDAZOLE 500 MG/100ML IV SOLN
500.0000 mg | Freq: Two times a day (BID) | INTRAVENOUS | Status: DC
  Administered 2022-12-29 – 2023-01-01 (×7): 500 mg via INTRAVENOUS
  Filled 2022-12-29 (×8): qty 100

## 2022-12-29 MED ORDER — SENNA 8.6 MG PO TABS
1.0000 | ORAL_TABLET | Freq: Two times a day (BID) | ORAL | Status: DC
  Administered 2022-12-29 – 2023-01-01 (×7): 8.6 mg via ORAL
  Filled 2022-12-29 (×7): qty 1

## 2022-12-29 MED ORDER — ONDANSETRON HCL 4 MG PO TABS: 4.0000 mg | ORAL_TABLET | Freq: Four times a day (QID) | ORAL | Status: DC | PRN

## 2022-12-29 MED ORDER — ONDANSETRON HCL 4 MG/2ML IJ SOLN: 4.0000 mg | Freq: Four times a day (QID) | INTRAMUSCULAR | Status: DC | PRN

## 2022-12-29 MED ORDER — ACETAMINOPHEN 650 MG RE SUPP: 650.0000 mg | Freq: Four times a day (QID) | RECTAL | Status: DC | PRN

## 2022-12-29 MED ORDER — HYDROMORPHONE HCL 2 MG PO TABS
2.0000 mg | ORAL_TABLET | ORAL | Status: DC | PRN
  Administered 2022-12-29 – 2022-12-30 (×4): 2 mg via ORAL
  Filled 2022-12-29 (×4): qty 1

## 2022-12-29 MED ORDER — ACETAMINOPHEN 325 MG PO TABS
650.0000 mg | ORAL_TABLET | Freq: Four times a day (QID) | ORAL | Status: DC | PRN
  Administered 2022-12-29: 650 mg via ORAL
  Filled 2022-12-29: qty 2

## 2022-12-29 MED ORDER — POTASSIUM CHLORIDE CRYS ER 20 MEQ PO TBCR
40.0000 meq | EXTENDED_RELEASE_TABLET | Freq: Once | ORAL | Status: AC
  Administered 2022-12-29: 40 meq via ORAL
  Filled 2022-12-29: qty 2

## 2022-12-29 MED ORDER — BISACODYL 5 MG PO TBEC: 5.0000 mg | DELAYED_RELEASE_TABLET | Freq: Every day | ORAL | Status: DC | PRN

## 2022-12-29 MED ORDER — POTASSIUM CHLORIDE CRYS ER 20 MEQ PO TBCR
20.0000 meq | EXTENDED_RELEASE_TABLET | Freq: Once | ORAL | Status: AC
  Administered 2022-12-29: 20 meq via ORAL
  Filled 2022-12-29: qty 1

## 2022-12-29 MED ORDER — POLYETHYLENE GLYCOL 3350 17 G PO PACK
17.0000 g | PACK | Freq: Every day | ORAL | Status: DC | PRN
  Administered 2022-12-31: 17 g via ORAL
  Filled 2022-12-29: qty 1

## 2022-12-29 MED ORDER — PANTOPRAZOLE SODIUM 40 MG PO TBEC
40.0000 mg | DELAYED_RELEASE_TABLET | Freq: Two times a day (BID) | ORAL | Status: DC
  Administered 2022-12-29 – 2023-01-01 (×7): 40 mg via ORAL
  Filled 2022-12-29 (×7): qty 1

## 2022-12-29 MED ORDER — SIMETHICONE 80 MG PO CHEW: 80.0000 mg | CHEWABLE_TABLET | Freq: Four times a day (QID) | ORAL | Status: DC | PRN

## 2022-12-29 MED ORDER — SODIUM CHLORIDE 0.9 % IV SOLN
1.0000 g | INTRAVENOUS | Status: DC
  Administered 2022-12-29 – 2022-12-31 (×3): 1 g via INTRAVENOUS
  Filled 2022-12-29 (×3): qty 10

## 2022-12-29 MED ORDER — SODIUM CHLORIDE 0.9 % IV SOLN: INTRAVENOUS | Status: AC

## 2022-12-29 MED ORDER — MAGNESIUM SULFATE 2 GM/50ML IV SOLN
2.0000 g | Freq: Once | INTRAVENOUS | Status: AC
  Administered 2022-12-29: 2 g via INTRAVENOUS
  Filled 2022-12-29: qty 50

## 2022-12-29 MED ORDER — ALUM & MAG HYDROXIDE-SIMETH 200-200-20 MG/5ML PO SUSP: 30.0000 mL | ORAL | Status: DC | PRN

## 2022-12-29 MED ORDER — HYDROCODONE-ACETAMINOPHEN 5-325 MG PO TABS: 1.0000 | ORAL_TABLET | Freq: Once | ORAL | Status: DC

## 2022-12-29 MED ORDER — SUCRALFATE 1 G PO TABS
1.0000 g | ORAL_TABLET | Freq: Three times a day (TID) | ORAL | Status: DC
  Administered 2022-12-29 – 2023-01-01 (×13): 1 g via ORAL
  Filled 2022-12-29 (×13): qty 1

## 2022-12-29 MED ORDER — MAGNESIUM OXIDE -MG SUPPLEMENT 400 (240 MG) MG PO TABS
400.0000 mg | ORAL_TABLET | Freq: Two times a day (BID) | ORAL | Status: DC
  Administered 2022-12-29 – 2023-01-01 (×7): 400 mg via ORAL
  Filled 2022-12-29 (×7): qty 1

## 2022-12-29 MED ORDER — HYDROMORPHONE HCL 1 MG/ML IJ SOLN
1.0000 mg | INTRAMUSCULAR | Status: AC | PRN
  Administered 2022-12-30 – 2022-12-31 (×3): 1 mg via INTRAVENOUS
  Filled 2022-12-29 (×4): qty 1

## 2022-12-29 MED ORDER — OXYCODONE HCL 5 MG PO TABS
5.0000 mg | ORAL_TABLET | ORAL | Status: DC | PRN
  Administered 2022-12-29 – 2023-01-01 (×10): 5 mg via ORAL
  Filled 2022-12-29 (×10): qty 1

## 2022-12-29 NOTE — H&P (Signed)
History and Physical    Holly Brown:[id number] DOB: 07-04-1985 DOA: 12/28/2022  PCP: Mort Sawyers, FNP   Patient coming from: Home   Chief Complaint: lower abdominal pain, vaginal bleeding   HPI: Holly Brown is a 37 y.o. female with medical history significant for symptomatic fibroids and menorrhagia status post recent hysterectomy, PUD, and migraines who presents with lower abdominal pain and vaginal bleeding.  Patient underwent vaginal hysterectomy on 12/16/2022 for symptomatic fibroids and menorrhagia, was subsequently admitted for postoperative hematoma, and then admitted again with peptic ulcer.  She reports that her abdominal pain has improved since the recent admission but she continues to have severe pelvic pain despite oral Dilaudid at home.  She reports passing significant amount of blood and clot yesterday.  She denies fevers, chills, chest pain, or lightheadedness.  ED Course: Upon arrival to the ED, patient is found to be afebrile and saturating well on room air with tachycardia and stable blood pressure.  Labs are most notable for WBC 20,800, platelets 561,000, and normal lactic acid.  CT demonstrates 7.2 x 7.6 cm fluid collection immediately superior to the vaginal cuff.  Urinalysis notable for bacteriuria, pyuria, and nitrites.  Patient was evaluated by OB/GYN in the ED.  Blood and urine cultures were collected and she was treated with 1 L of LR, Rocephin, Flagyl, and 3 doses IV Dilaudid.  Review of Systems:  All other systems reviewed and apart from HPI, are negative.  Past Medical History:  Diagnosis Date   Gestational diabetes    metformin - resolved after delivery   History of UTI    HSV (herpes simplex virus) infection    Migraines    Peripheral vascular disease (HCC)    Post-operative nausea and vomiting 12/19/2022   Seizures (HCC)    Jan.2016 - only had one seizure, none since   Vaginal Pap smear, abnormal     Past Surgical History:  Procedure  Laterality Date   DILATION AND CURETTAGE OF UTERUS     LAPAROSCOPIC TUBAL LIGATION Bilateral 12/08/2018   Procedure: LAPAROSCOPIC TUBAL LIGATION;  Surgeon: Catalina Antigua, MD;  Location: [facility];  Service: Gynecology;  Laterality: Bilateral;   LEEP N/A 03/18/2022   Procedure: LOOP ELECTROSURGICAL EXCISION PROCEDURE (LEEP);  Surgeon: Catalina Antigua, MD;  Location: [facility] OR;  Service: Gynecology;  Laterality: N/A;   VAGINAL HYSTERECTOMY N/A 12/16/2022   Procedure: TOTAL VAGINAL HYSTERECTOMY, BILATERAL SALPINGECTOMY;  Surgeon: Hermina Staggers, MD;  Location: [facility] OR;  Service: Gynecology;  Laterality: N/A;   WISDOM TOOTH EXTRACTION  04/21/2009    Social History:   reports that she has quit smoking. Her smoking use included cigarettes. She started smoking about 11 years ago. She has a 2.8 pack-year smoking history. She has never been exposed to tobacco smoke. She has never used smokeless tobacco. She reports that she does not currently use alcohol. She reports that she does not currently use drugs after having used the following drugs: Marijuana.  No Known Allergies  Family History  Problem Relation Age of Onset   Diabetes Mother    Stroke Mother    Colon cancer Father 90   Hypertension Maternal Grandmother    Diabetes Maternal Grandmother    Hypertension Paternal Grandmother      Prior to Admission medications   Medication Sig Start Date End Date Taking? Authorizing Provider  alum & mag hydroxide-simeth (MAALOX/MYLANTA) 200-200-20 MG/5ML suspension Take 30 mLs by mouth every 4 (four) hours as needed for indigestion. 12/22/22  Yes Lennart Pall, MD  HYDROmorphone (DILAUDID) 2 MG tablet Take 1 tablet (2 mg total) by mouth every 4 (four) hours as needed for severe pain. 12/18/22  Yes Hermina Staggers, MD  metoCLOPramide (REGLAN) 10 MG tablet Take 1 tablet (10 mg total) by mouth 4 (four) times daily -  before meals and at bedtime. Patient taking differently: Take 10 mg by  mouth every 6 (six) hours as needed for vomiting or nausea. 12/26/22  Yes Hermina Staggers, MD  pantoprazole (PROTONIX) 40 MG tablet Take 1 tablet (40 mg total) by mouth 2 (two) times daily. 12/26/22  Yes Hermina Staggers, MD  senna (SENOKOT) 8.6 MG tablet Take 1 tablet by mouth 2 (two) times daily.   Yes [provider]  simethicone (MYLICON) 80 MG chewable tablet Chew 80 mg by mouth every 6 (six) hours as needed for flatulence.   Yes [provider]  sucralfate (CARAFATE) 1 g tablet Take 1 tablet (1 g total) by mouth 4 (four) times daily -  with meals and at bedtime. 12/26/22  Yes Hermina Staggers, MD    Physical Exam: Vitals:   12/28/22 2330 12/29/22 0127 12/29/22 0230 12/29/22 0327  BP: 130/71 121/64 132/77   Pulse: 99 (!) 104 98   Resp: 15 16 16    Temp:    99.3 F (37.4 C)  TempSrc:    Oral  SpO2: 100% 100% 100%   Weight:      Height:         Constitutional: NAD, no pallor or diaphoresis  Eyes: PERTLA, lids and conjunctivae normal ENMT: Mucous membranes are moist. Posterior pharynx clear of any exudate or lesions.   Neck: supple, no masses  Respiratory: no wheezing, no crackles. No accessory muscle use.  Cardiovascular: S1 & S2 heard, regular rate and rhythm. No extremity edema.   Abdomen: Soft, no rebound pain or guarding. Bowel sounds active.  Musculoskeletal: no clubbing / cyanosis. No joint deformity upper and lower extremities.   Skin: no significant rashes, lesions, ulcers. Warm, dry, well-perfused. Neurologic: CN 2-12 grossly intact. Moving all extremities. Sleeping. Wakes to voice and is oriented.  Psychiatric: Calm. Cooperative.    Labs and Imaging on Admission: I have personally reviewed following labs and imaging studies  CBC: Recent Labs  Lab 12/22/22 1522 12/24/22 0546 12/28/22 1738  WBC 17.0* 13.0* 20.0*  NEUTROABS 13.5*  --   --   HGB 11.6* 10.2* 11.0*  HCT 34.8* 30.6* 33.9*  MCV 90.6 92.2 90.9  PLT 508* 472* 561*   Basic Metabolic  Panel: Recent Labs  Lab 12/22/22 1522 12/24/22 0546 12/28/22 1738  NA 139 139 132*  K 3.0* 3.5 3.4*  CL 107 105 100  CO2 19* 21* 20*  GLUCOSE 132* 95 160*  BUN 5* 9 6  CREATININE 1.16* 1.12* 1.13*  CALCIUM 9.6 9.0 9.0   GFR: Estimated Creatinine Clearance: 92.9 mL/min (A) (by C-G formula based on SCr of 1.13 mg/dL (H)). Liver Function Tests: Recent Labs  Lab 12/22/22 1522 12/28/22 1738  AST 24 24  ALT 29 20  ALKPHOS 62 63  BILITOT 2.2* 1.4*  PROT 7.3 6.5  ALBUMIN 4.1 3.4*   Recent Labs  Lab 12/23/22 1345 12/28/22 1738  LIPASE  --  21  AMYLASE 55  --    No results for input(s): "AMMONIA" in the last 168 hours. Coagulation Profile: Recent Labs  Lab 12/28/22 2248  INR 1.0   Cardiac Enzymes: No results for input(s): "CKTOTAL", "CKMB", "  CKMBINDEX", "TROPONINI" in the last 168 hours. BNP (last 3 results) No results for input(s): "PROBNP" in the last 8760 hours. HbA1C: No results for input(s): "HGBA1C" in the last 72 hours. CBG: No results for input(s): "GLUCAP" in the last 168 hours. Lipid Profile: No results for input(s): "CHOL", "HDL", "LDLCALC", "TRIG", "CHOLHDL", "LDLDIRECT" in the last 72 hours. Thyroid Function Tests: No results for input(s): "TSH", "T4TOTAL", "FREET4", "T3FREE", "THYROIDAB" in the last 72 hours. Anemia Panel: No results for input(s): "VITAMINB12", "FOLATE", "FERRITIN", "TIBC", "IRON", "RETICCTPCT" in the last 72 hours. Urine analysis:    Component Value Date/Time   COLORURINE AMBER (A) 12/28/2022 2153   APPEARANCEUR CLOUDY (A) 12/28/2022 2153   LABSPEC 1.018 12/28/2022 2153   PHURINE 5.0 12/28/2022 2153   GLUCOSEU NEGATIVE 12/28/2022 2153   HGBUR MODERATE (A) 12/28/2022 2153   BILIRUBINUR NEGATIVE 12/28/2022 2153   BILIRUBINUR ++ 08/09/2018 1607   KETONESUR 5 (A) 12/28/2022 2153   PROTEINUR 30 (A) 12/28/2022 2153   UROBILINOGEN 2.0 (A) 08/09/2018 1607   UROBILINOGEN 0.2 01/04/2018 1129   NITRITE POSITIVE (A) 12/28/2022 2153    LEUKOCYTESUR MODERATE (A) 12/28/2022 2153   Sepsis Labs: @LABRCNTIP (procalcitonin:4,lacticidven:4) ) Recent Results (from the past 240 hour(s))  Resp panel by RT-PCR (RSV, Flu A&B, Covid) Anterior Nasal Swab     Status: None   Collection Time: 12/28/22  9:52 PM   Specimen: Anterior Nasal Swab  Result Value Ref Range Status   SARS Coronavirus 2 by RT PCR NEGATIVE NEGATIVE Final   Influenza A by PCR NEGATIVE NEGATIVE Final   Influenza B by PCR NEGATIVE NEGATIVE Final    Comment: (NOTE) The Xpert Xpress SARS-CoV-2/FLU/RSV plus assay is intended as an aid in the diagnosis of influenza from Nasopharyngeal swab specimens and should not be used as a sole basis for treatment. Nasal washings and aspirates are unacceptable for Xpert Xpress SARS-CoV-2/FLU/RSV testing.  Fact Sheet for Patients: BloggerCourse.com  Fact Sheet for Healthcare Providers: SeriousBroker.it  This test is not yet approved or cleared by the [place] FDA and has been authorized for detection and/or diagnosis of SARS-CoV-2 by FDA under an Emergency Use Authorization (EUA). This EUA will remain in effect (meaning this test can be used) for the duration of the COVID-19 declaration under Section 564(b)(1) of the Act, 21 U.S.C. section 360bbb-3(b)(1), unless the authorization is terminated or revoked.     Resp Syncytial Virus by PCR NEGATIVE NEGATIVE Final    Comment: (NOTE) Fact Sheet for Patients: BloggerCourse.com  Fact Sheet for Healthcare Providers: SeriousBroker.it  This test is not yet approved or cleared by the [place] FDA and has been authorized for detection and/or diagnosis of SARS-CoV-2 by FDA under an Emergency Use Authorization (EUA). This EUA will remain in effect (meaning this test can be used) for the duration of the COVID-19 declaration under Section 564(b)(1) of the Act, 21  U.S.C. section 360bbb-3(b)(1), unless the authorization is terminated or revoked.  Performed at [facility], [address] [place], [place] [address]      Radiological Exams on Admission: CT ABDOMEN PELVIS W CONTRAST  Addendum Date: 12/29/2022   ADDENDUM REPORT: 12/29/2022 01:22 ADDENDUM: Delayed images were obtained which demonstrate filling of the mid ureters and bladder but no extravasated contrast to indicated urine leak. Electronically Signed   By: Helyn Numbers M.D.   On: 12/29/2022 01:22   Result Date: 12/29/2022 CLINICAL DATA:  Blunt abdominal trauma, persistent abdominal pain following vaginal hysterectomy 12/16/2022, now with vaginal bleeding. EXAM: CT ABDOMEN  AND PELVIS WITH CONTRAST TECHNIQUE: Multidetector CT imaging of the abdomen and pelvis was performed using the standard protocol following bolus administration of intravenous contrast. Delayed images were attempted, but refused by the patient. RADIATION DOSE REDUCTION: This exam was performed according to the departmental dose-optimization program which includes automated exposure control, adjustment of the mA and/or kV according to patient size and/or use of iterative reconstruction technique. CONTRAST:  75mL OMNIPAQUE IOHEXOL 350 MG/ML SOLN COMPARISON:  12/22/2022 FINDINGS: Lower chest: No acute abnormality. Hepatobiliary: There is focal fatty hepatic infiltration at the falciform ligament. Liver is otherwise unremarkable. Gallbladder unremarkable. No intra or extrahepatic biliary ductal dilation. A small amount of fluid is seen within Morison's pouch, new since prior examination. Pancreas: Unremarkable Spleen: The spleen is normal in size. No intrasplenic lesions are seen. Small perisplenic high attenuation fluid is present in keeping with mild hemoperitoneum, new since prior examination. Adrenals/Urinary Tract: The adrenal glands are unremarkable. The kidneys are normal in size and position demonstrate normal cortical  enhancement. No hydronephrosis. No intrarenal or ureteral calculi. There is infiltrative change within the peritoneum superior to the dome of the bladder, however, the bladder itself is unremarkable. The distal ureters are obscured by extensive fluid inflammatory change within the pelvis. Delayed images were not obtained and continuity of the ureters is not definitively confirmed on this examination though extravasation was not identified on prior exam. Stomach/Bowel: A complex lobulated fluid collection has developed within the pelvis measuring at least 7.2 x 7.6 cm at axial image # 75/3 demonstrating incomplete loculation. Collection is immediately superior to the vaginal cuff and may represent a subacute to chronic hematoma given the high attenuation fluid noted on prior examination in this region. There is new infiltration within the omentum anterior to this collection as well as a small sentinel clot at axial image # 77/3 which may reflect changes of recurrent hemorrhage. The stomach, small bowel, and large bowel are unremarkable. The mid rectum is difficult to discern discretely from the adjacent complex fluid collection within the pelvis, but is otherwise unremarkable. No free intraperitoneal gas. Vascular/Lymphatic: No significant vascular findings are present. No enlarged abdominal or pelvic lymph nodes. Reproductive: Status post hysterectomy. Residual ovaries are unremarkable. The ovarian veins are patent bilaterally. Other: No abdominal wall hernia. Musculoskeletal: No acute or significant osseous findings. IMPRESSION: 1. Interval development of a complex lobulated fluid collection within the pelvis measuring at least 7.2 x 7.6 cm, immediately superior to the vaginal cuff. This may represent a subacute to chronic hematoma given the high attenuation fluid noted on prior examination in this region. There is new infiltration within the omentum anterior to this collection as well as a small sentinel clot  which may reflect changes of recurrent hemorrhage in this region. 2. Interval development of mild hemoperitoneum within Morison's pouch and perisplenic space. 3. The distal ureters are obscured by extensive fluid inflammatory change within the pelvis. Delayed images were not obtained and continuity of the ureters is not definitively confirmed on this examination though extravasation was not identified on prior exam. Electronically Signed: By: Helyn Numbers M.D. On: 12/29/2022 00:42   DG Chest Port 1 View  Result Date: 12/28/2022 CLINICAL DATA:  Questionable sepsis - evaluate for abnormality EXAM: PORTABLE CHEST 1 VIEW COMPARISON:  Chest x-ray 05/18/2013 FINDINGS: The heart and mediastinal contours are within normal limits. No focal consolidation. No pulmonary edema. No pleural effusion. No pneumothorax. No acute osseous abnormality. Indeterminate metallic density measuring up to 1.3 cm overlying the expected region of  the distal esophagus. IMPRESSION: 1. Indeterminate metallic density measuring up to 1.3 cm overlying the expected region of the distal esophagus. Correlate with clinical history. Consider repeat PA and lateral view of the chest. 2. Otherwise no acute cardiopulmonary abnormality. Electronically Signed   By: Tish Frederickson M.D.   On: 12/28/2022 21:12    EKG: Independently reviewed. Sinus rhythm.   Assessment/Plan   1. Intractable pelvic pain  - Appreciate OBGYN assessment; no acute findings on CT or exam  - Short-course IV Dilaudid, transition back to oral agents as able    2. Vaginal bleeding and discharge   - Hgb stable, volume of postoperative hematoma decreasing  - Antibiotics recommended by OBGYN in light of malodorous fluid, will continue Rocephin and Flagyl for now   3. UTI  - Blood and urine cultures collected in ED  - Continue Rocephin    4. PUD  - Continue PPI and Carafate   5. Hypokalemia  - Replacing     DVT prophylaxis: SCDs  Code Status: Full  Level of Care:  Level of care: Med-Surg Family Communication: None present  Disposition Plan:  Patient is from: Home  Anticipated d/c is to: Home  Anticipated d/c date is: 12/30/22  Patient currently: Pending pain-control  Consults called: OBGYN  Admission status: Observation     Briscoe Deutscher, MD [facility]  12/29/2022, 3:45 AM

## 2022-12-29 NOTE — ED Notes (Signed)
ED TO INPATIENT HANDOFF REPORT  ED Nurse Name and Phone #:   S Name/Age/Gender Holly Brown 37 y.o. female Room/Bed: 045C/045C  Code Status   Code Status: Full Code  Home/SNF/Other Home Patient oriented to: self, place, time, and situation Is this baseline? Yes   Triage Complete: Triage complete  Chief Complaint Intractable abdominal pain [R10.9]  Triage Note The pt had a vaginal hysterectomy  aug 27  she has been and out of the hospital since with abd/ pain constipated  but today she woke up in a pool of blood with clots abd pain still   Allergies No Known Allergies  Level of Care/Admitting Diagnosis ED Disposition     ED Disposition  Admit   Condition  --   Comment  Hospital Area: [facility] [[id number]]  Level of Care: Med-Surg [16]  May place patient in observation at [facility] or [facility] if equivalent level of care is available:: No  Covid Evaluation: Confirmed COVID Negative  Diagnosis: Intractable abdominal pain [720109]  Admitting Physician: Briscoe Deutscher [[id number]]  Attending Physician: Briscoe Deutscher [[id number]]          B Medical/Surgery History Past Medical History:  Diagnosis Date   Gestational diabetes    metformin - resolved after delivery   History of UTI    HSV (herpes simplex virus) infection    Migraines    Peripheral vascular disease (HCC)    Post-operative nausea and vomiting 12/19/2022   Seizures (HCC)    Jan.2016 - only had one seizure, none since   Vaginal Pap smear, abnormal    Past Surgical History:  Procedure Laterality Date   BIOPSY  12/24/2022   Procedure: BIOPSY;  Surgeon: Benancio Deeds, MD;  Location: [facility];  Service: Gastroenterology;;   DILATION AND CURETTAGE OF UTERUS     ESOPHAGOGASTRODUODENOSCOPY (EGD) WITH PROPOFOL N/A 12/24/2022   Procedure: ESOPHAGOGASTRODUODENOSCOPY (EGD) WITH PROPOFOL;  Surgeon: Benancio Deeds, MD;  Location: [facility];  Service: Gastroenterology;   Laterality: N/A;   LAPAROSCOPIC TUBAL LIGATION Bilateral 12/08/2018   Procedure: LAPAROSCOPIC TUBAL LIGATION;  Surgeon: Catalina Antigua, MD;  Location: [facility];  Service: Gynecology;  Laterality: Bilateral;   LEEP N/A 03/18/2022   Procedure: LOOP ELECTROSURGICAL EXCISION PROCEDURE (LEEP);  Surgeon: Catalina Antigua, MD;  Location: [facility];  Service: Gynecology;  Laterality: N/A;   VAGINAL HYSTERECTOMY N/A 12/16/2022   Procedure: TOTAL VAGINAL HYSTERECTOMY, BILATERAL SALPINGECTOMY;  Surgeon: Hermina Staggers, MD;  Location: [facility];  Service: Gynecology;  Laterality: N/A;   WISDOM TOOTH EXTRACTION  04/21/2009     A IV Location/Drains/Wounds Patient Lines/Drains/Airways Status     Active Line/Drains/Airways     Name Placement date Placement time Site Days   Peripheral IV 12/28/22 20 G Right Antecubital 12/28/22  2123  Antecubital  1   Peripheral IV 12/28/22 20 G Posterior;Right Hand 12/28/22  2207  Hand  1            Intake/Output Last 24 hours No intake or output data in the 24 hours ending 12/29/22 1354  Labs/Imaging Results for orders placed or performed during the hospital encounter of 12/28/22 (from the past 48 hour(s))  Lipase, blood     Status: None   Collection Time: 12/28/22  5:38 PM  Result Value Ref Range   Lipase 21 11 - 51 U/L    Comment: Performed at [facility], [address] [place], [place] [address]  Comprehensive metabolic panel  Status: Abnormal   Collection Time: 12/28/22  5:38 PM  Result Value Ref Range   Sodium 132 (L) 135 - 145 mmol/L   Potassium 3.4 (L) 3.5 - 5.1 mmol/L   Chloride 100 98 - 111 mmol/L   CO2 20 (L) 22 - 32 mmol/L   Glucose, Bld 160 (H) 70 - 99 mg/dL    Comment: Glucose reference range applies only to samples taken after fasting for at least 8 hours.   BUN 6 6 - 20 mg/dL   Creatinine, Ser 1.61 (H) 0.44 - 1.00 mg/dL   Calcium 9.0 8.9 - 09.6 mg/dL   Total Protein 6.5 6.5 - 8.1 g/dL   Albumin 3.4 (L) 3.5 - 5.0  g/dL   AST 24 15 - 41 U/L   ALT 20 0 - 44 U/L   Alkaline Phosphatase 63 38 - 126 U/L   Total Bilirubin 1.4 (H) 0.3 - 1.2 mg/dL   GFR, Estimated >04 >54 mL/min    Comment: (NOTE) Calculated using the CKD-EPI Creatinine Equation (2021)    Anion gap 12 5 - 15    Comment: Performed at [facility], [address] [place], [place] [address]  CBC     Status: Abnormal   Collection Time: 12/28/22  5:38 PM  Result Value Ref Range   WBC 20.0 (H) 4.0 - 10.5 K/uL   RBC 3.73 (L) 3.87 - 5.11 MIL/uL   Hemoglobin 11.0 (L) 12.0 - 15.0 g/dL   HCT 91.4 (L) 78.2 - 95.6 %   MCV 90.9 80.0 - 100.0 fL   MCH 29.5 26.0 - 34.0 pg   MCHC 32.4 30.0 - 36.0 g/dL   RDW 21.3 08.6 - 57.8 %   Platelets 561 (H) 150 - 400 K/uL   nRBC 0.0 0.0 - 0.2 %    Comment: Performed at [facility], [address] [place], [place] [address]  Resp panel by RT-PCR (RSV, Flu A&B, Covid) Anterior Nasal Swab     Status: None   Collection Time: 12/28/22  9:52 PM   Specimen: Anterior Nasal Swab  Result Value Ref Range   SARS Coronavirus 2 by RT PCR NEGATIVE NEGATIVE   Influenza A by PCR NEGATIVE NEGATIVE   Influenza B by PCR NEGATIVE NEGATIVE    Comment: (NOTE) The Xpert Xpress SARS-CoV-2/FLU/RSV plus assay is intended as an aid in the diagnosis of influenza from Nasopharyngeal swab specimens and should not be used as a sole basis for treatment. Nasal washings and aspirates are unacceptable for Xpert Xpress SARS-CoV-2/FLU/RSV testing.  Fact Sheet for Patients: BloggerCourse.com  Fact Sheet for Healthcare Providers: SeriousBroker.it  This test is not yet approved or cleared by the [place] FDA and has been authorized for detection and/or diagnosis of SARS-CoV-2 by FDA under an Emergency Use Authorization (EUA). This EUA will remain in effect (meaning this test can be used) for the duration of the COVID-19 declaration under Section 564(b)(1) of the Act, 21  U.S.C. section 360bbb-3(b)(1), unless the authorization is terminated or revoked.     Resp Syncytial Virus by PCR NEGATIVE NEGATIVE    Comment: (NOTE) Fact Sheet for Patients: BloggerCourse.com  Fact Sheet for Healthcare Providers: SeriousBroker.it  This test is not yet approved or cleared by the [place] FDA and has been authorized for detection and/or diagnosis of SARS-CoV-2 by FDA under an Emergency Use Authorization (EUA). This EUA will remain in effect (meaning this test can be used) for the duration of the COVID-19 declaration under Section  564(b)(1) of the Act, 21 U.S.C. section 360bbb-3(b)(1), unless the authorization is terminated or revoked.  Performed at [facility], [address] [place], [place] [address]   Blood Culture (routine x 2)     Status: None (Preliminary result)   Collection Time: 12/28/22  9:52 PM   Specimen: BLOOD  Result Value Ref Range   Specimen Description BLOOD RIGHT HAND    Special Requests      BOTTLES DRAWN AEROBIC ONLY Blood Culture adequate volume   Culture      NO GROWTH < 12 HOURS Performed at [facility], [address] [place], [place] [address]    Report Status PENDING   Urinalysis, w/ Reflex to Culture (Infection Suspected) -Urine, Clean Catch     Status: Abnormal   Collection Time: 12/28/22  9:53 PM  Result Value Ref Range   Specimen Source URINE, CLEAN CATCH    Color, Urine AMBER (A) YELLOW    Comment: BIOCHEMICALS MAY BE AFFECTED BY COLOR   APPearance CLOUDY (A) CLEAR   Specific Gravity, Urine 1.018 1.005 - 1.030   pH 5.0 5.0 - 8.0   Glucose, UA NEGATIVE NEGATIVE mg/dL   Hgb urine dipstick MODERATE (A) NEGATIVE   Bilirubin Urine NEGATIVE NEGATIVE   Ketones, ur 5 (A) NEGATIVE mg/dL   Protein, ur 30 (A) NEGATIVE mg/dL   Nitrite POSITIVE (A) NEGATIVE   Leukocytes,Ua MODERATE (A) NEGATIVE   RBC / HPF 0-5 0 - 5 RBC/hpf   WBC, UA >50 0 - 5 WBC/hpf     Comment:        Reflex urine culture not performed if WBC <=10, OR if Squamous epithelial cells >5. If Squamous epithelial cells >5 suggest recollection.    Bacteria, UA MANY (A) NONE SEEN   Squamous Epithelial / HPF 0-5 0 - 5 /HPF   Mucus PRESENT     Comment: Performed at [facility], [address], [place], [place] [address]  Protime-INR     Status: None   Collection Time: 12/28/22 10:48 PM  Result Value Ref Range   Prothrombin Time 13.8 11.4 - 15.2 seconds   INR 1.0 0.8 - 1.2    Comment: (NOTE) INR goal varies based on device and disease states. Performed at [facility], [address] [place], [place] [address]   APTT     Status: None   Collection Time: 12/28/22 10:48 PM  Result Value Ref Range   aPTT 32 24 - 36 seconds    Comment: Performed at [facility], [address], [place], [place] [address]  Blood Culture (routine x 2)     Status: None (Preliminary result)   Collection Time: 12/28/22 10:48 PM   Specimen: BLOOD  Result Value Ref Range   Specimen Description BLOOD LEFT ARM    Special Requests      BOTTLES DRAWN AEROBIC AND ANAEROBIC Blood Culture results may not be optimal due to an excessive volume of blood received in culture bottles   Culture      NO GROWTH < 12 HOURS Performed at [facility], [address], [place], [place] [address]    Report Status PENDING   I-Stat Lactic Acid, ED     Status: None   Collection Time: 12/28/22 10:57 PM  Result Value Ref Range   Lactic Acid, Venous 1.2 0.5 - 1.9 mmol/L  Basic metabolic panel     Status: Abnormal   Collection Time: 12/29/22  4:26 AM  Result Value Ref Range   Sodium 133 (L) 135 - 145 mmol/L   Potassium 3.4 (L) 3.5 - 5.1 mmol/L   Chloride 100 98 - 111 mmol/L   CO2 20 (L) 22 - 32 mmol/L   Glucose, Bld 139 (H) 70 - 99 mg/dL    Comment: Glucose reference range applies only to samples taken after fasting for at least 8 hours.   BUN <5 (L) 6 - 20 mg/dL   Creatinine, Ser 2.95 0.44  - 1.00 mg/dL   Calcium 8.6 (L) 8.9 - 10.3 mg/dL   GFR, Estimated >18 >84 mL/min    Comment: (NOTE) Calculated using the CKD-EPI Creatinine Equation (2021)    Anion gap 13 5 - 15    Comment: Performed at [facility], [address] [place], [place] [address]  Magnesium     Status: Abnormal   Collection Time: 12/29/22  4:26 AM  Result Value Ref Range   Magnesium 1.6 (L) 1.7 - 2.4 mg/dL    Comment: Performed at [facility], [address], [place], [place] [address]  CBC     Status: Abnormal   Collection Time: 12/29/22  4:26 AM  Result Value Ref Range   WBC 20.7 (H) 4.0 - 10.5 K/uL   RBC 3.30 (L) 3.87 - 5.11 MIL/uL   Hemoglobin 9.8 (L) 12.0 - 15.0 g/dL   HCT 10.9 (L) 32.3 - 55.7 %   MCV 92.7 80.0 - 100.0 fL   MCH 29.7 26.0 - 34.0 pg   MCHC 32.0 30.0 - 36.0 g/dL   RDW 32.2 02.5 - 42.7 %   Platelets 478 (H) 150 - 400 K/uL   nRBC 0.0 0.0 - 0.2 %    Comment: Performed at [facility], [address], [place], [place] [address]   CT ABDOMEN PELVIS W CONTRAST  Addendum Date: 12/29/2022   ADDENDUM REPORT: 12/29/2022 01:22 ADDENDUM: Delayed images were obtained which demonstrate filling of the mid ureters and bladder but no extravasated contrast to indicated urine leak. Electronically Signed   By: Helyn Numbers M.D.   On: 12/29/2022 01:22   Result Date: 12/29/2022 CLINICAL DATA:  Blunt abdominal trauma, persistent abdominal pain following vaginal hysterectomy 12/16/2022, now with vaginal bleeding. EXAM: CT ABDOMEN AND PELVIS WITH CONTRAST TECHNIQUE: Multidetector CT imaging of the abdomen and pelvis was performed using the standard protocol following bolus administration of intravenous contrast. Delayed images were attempted, but refused by the patient. RADIATION DOSE REDUCTION: This exam was performed according to the departmental dose-optimization program which includes automated exposure control, adjustment of the mA and/or kV according to patient size and/or use of  iterative reconstruction technique. CONTRAST:  75mL OMNIPAQUE IOHEXOL 350 MG/ML SOLN COMPARISON:  12/22/2022 FINDINGS: Lower chest: No acute abnormality. Hepatobiliary: There is focal fatty hepatic infiltration at the falciform ligament. Liver is otherwise unremarkable. Gallbladder unremarkable. No intra or extrahepatic biliary ductal dilation. A small amount of fluid is seen within Morison's pouch, new since prior examination. Pancreas: Unremarkable Spleen: The spleen is normal in size. No intrasplenic lesions are seen. Small perisplenic high attenuation fluid is present in keeping with mild hemoperitoneum, new since prior examination. Adrenals/Urinary Tract: The adrenal glands are unremarkable. The kidneys are normal in size and position demonstrate normal cortical enhancement. No hydronephrosis. No intrarenal or ureteral calculi. There is infiltrative change within the peritoneum superior to the dome of the bladder, however, the bladder itself is unremarkable. The distal ureters are obscured by extensive fluid  inflammatory change within the pelvis. Delayed images were not obtained and continuity of the ureters is not definitively confirmed on this examination though extravasation was not identified on prior exam. Stomach/Bowel: A complex lobulated fluid collection has developed within the pelvis measuring at least 7.2 x 7.6 cm at axial image # 75/3 demonstrating incomplete loculation. Collection is immediately superior to the vaginal cuff and may represent a subacute to chronic hematoma given the high attenuation fluid noted on prior examination in this region. There is new infiltration within the omentum anterior to this collection as well as a small sentinel clot at axial image # 77/3 which may reflect changes of recurrent hemorrhage. The stomach, small bowel, and large bowel are unremarkable. The mid rectum is difficult to discern discretely from the adjacent complex fluid collection within the pelvis, but is  otherwise unremarkable. No free intraperitoneal gas. Vascular/Lymphatic: No significant vascular findings are present. No enlarged abdominal or pelvic lymph nodes. Reproductive: Status post hysterectomy. Residual ovaries are unremarkable. The ovarian veins are patent bilaterally. Other: No abdominal wall hernia. Musculoskeletal: No acute or significant osseous findings. IMPRESSION: 1. Interval development of a complex lobulated fluid collection within the pelvis measuring at least 7.2 x 7.6 cm, immediately superior to the vaginal cuff. This may represent a subacute to chronic hematoma given the high attenuation fluid noted on prior examination in this region. There is new infiltration within the omentum anterior to this collection as well as a small sentinel clot which may reflect changes of recurrent hemorrhage in this region. 2. Interval development of mild hemoperitoneum within Morison's pouch and perisplenic space. 3. The distal ureters are obscured by extensive fluid inflammatory change within the pelvis. Delayed images were not obtained and continuity of the ureters is not definitively confirmed on this examination though extravasation was not identified on prior exam. Electronically Signed: By: Helyn Numbers M.D. On: 12/29/2022 00:42   DG Chest Port 1 View  Result Date: 12/28/2022 CLINICAL DATA:  Questionable sepsis - evaluate for abnormality EXAM: PORTABLE CHEST 1 VIEW COMPARISON:  Chest x-ray 05/18/2013 FINDINGS: The heart and mediastinal contours are within normal limits. No focal consolidation. No pulmonary edema. No pleural effusion. No pneumothorax. No acute osseous abnormality. Indeterminate metallic density measuring up to 1.3 cm overlying the expected region of the distal esophagus. IMPRESSION: 1. Indeterminate metallic density measuring up to 1.3 cm overlying the expected region of the distal esophagus. Correlate with clinical history. Consider repeat PA and lateral view of the chest. 2.  Otherwise no acute cardiopulmonary abnormality. Electronically Signed   By: Tish Frederickson M.D.   On: 12/28/2022 21:12    Pending Labs Unresulted Labs (From admission, onward)     Start     Ordered   12/30/22 0500  Magnesium  Tomorrow morning,   R        12/29/22 1025   12/29/22 0500  Basic metabolic panel  Daily,   R      12/29/22 0345   12/29/22 0500  CBC  Daily,   R      12/29/22 0345   12/28/22 2153  Urine Culture  Once,   R        12/28/22 2153            Vitals/Pain Today's Vitals   12/29/22 1125 12/29/22 1200 12/29/22 1300 12/29/22 1349  BP:  126/61  126/61  Pulse:  (!) 117 (!) 113 (!) 127  Resp:    16  Temp:    (!) 101.6 F (  38.7 C)  TempSrc:    Oral  SpO2:  98% 99% 100%  Weight:      Height:      PainSc: 8        Isolation Precautions No active isolations  Medications Medications  HYDROmorphone (DILAUDID) tablet 2 mg (2 mg Oral Given 12/29/22 1124)  alum & mag hydroxide-simeth (MAALOX/MYLANTA) 200-200-20 MG/5ML suspension 30 mL (has no administration in time range)  pantoprazole (PROTONIX) EC tablet 40 mg (40 mg Oral Given 12/29/22 0933)  senna (SENOKOT) tablet 8.6 mg (8.6 mg Oral Given 12/29/22 0933)  simethicone (MYLICON) chewable tablet 80 mg (has no administration in time range)  sucralfate (CARAFATE) tablet 1 g (1 g Oral Given 12/29/22 1125)  0.9 %  sodium chloride infusion (0 mLs Intravenous Stopped 12/29/22 1310)  cefTRIAXone (ROCEPHIN) 1 g in sodium chloride 0.9 % 100 mL IVPB (has no administration in time range)  metroNIDAZOLE (FLAGYL) IVPB 500 mg (0 mg Intravenous Stopped 12/29/22 1101)  acetaminophen (TYLENOL) tablet 650 mg (has no administration in time range)    Or  acetaminophen (TYLENOL) suppository 650 mg (has no administration in time range)  oxyCODONE (Oxy IR/ROXICODONE) immediate release tablet 5 mg (5 mg Oral Given 12/29/22 0758)  HYDROmorphone (DILAUDID) injection 1 mg (has no administration in time range)  polyethylene glycol (MIRALAX /  GLYCOLAX) packet 17 g (has no administration in time range)  bisacodyl (DULCOLAX) EC tablet 5 mg (has no administration in time range)  ondansetron (ZOFRAN) tablet 4 mg (has no administration in time range)    Or  ondansetron (ZOFRAN) injection 4 mg (has no administration in time range)  naloxone [facility]) injection 0.4 mg (has no administration in time range)  magnesium oxide (MAG-OX) tablet 400 mg (400 mg Oral Given 12/29/22 0933)  lactated ringers bolus 1,000 mL (0 mLs Intravenous Stopped 12/29/22 0009)  HYDROmorphone (DILAUDID) injection 0.5 mg (0.5 mg Intravenous Given 12/28/22 2151)  cefTRIAXone (ROCEPHIN) 1 g in sodium chloride 0.9 % 100 mL IVPB (0 g Intravenous Stopped 12/29/22 0009)  iohexol (OMNIPAQUE) 350 MG/ML injection 75 mL (75 mLs Intravenous Contrast Given 12/28/22 2352)  HYDROmorphone (DILAUDID) injection 1 mg (1 mg Intravenous Given 12/29/22 0017)  HYDROmorphone (DILAUDID) injection 1 mg (1 mg Intravenous Given 12/29/22 0323)  metroNIDAZOLE (FLAGYL) IVPB 500 mg (0 mg Intravenous Stopped 12/29/22 0431)  potassium chloride SA (KLOR-CON M) CR tablet 20 mEq (20 mEq Oral Given 12/29/22 0432)  potassium chloride SA (KLOR-CON M) CR tablet 40 mEq (40 mEq Oral Given 12/29/22 0933)  magnesium sulfate IVPB 2 g 50 mL (0 g Intravenous Stopped 12/29/22 1101)    Mobility walks     Focused Assessments    R Recommendations: See Admitting Provider Note  Report given to:   Additional Notes:

## 2022-12-29 NOTE — Hospital Course (Signed)
Holly Brown is a 37 y.o. female with medical history significant for symptomatic fibroids and menorrhagia status post recent hysterectomy, PUD, and migraines who presented to the hospital with lower abdominal pain and vaginal bleeding. Patient underwent vaginal hysterectomy on 12/16/2022 for symptomatic fibroids and menorrhagia, was subsequently admitted for postoperative hematoma, and then admitted again with peptic ulcer.  She reported that her abdominal pain had improved since the recent admission but she continues to have severe pelvic pain despite oral Dilaudid at home.  In the ED,  patient was found to be afebrile and saturating well on room air with tachycardia and stable blood pressure.  Labs were most notable for WBC 20,800, platelets 561,000, and normal lactic acid.  CT demonstrates 7.2 x 7.6 cm fluid collection immediately superior to the vaginal cuff.  Urinalysis was notable for bacteriuria, pyuria, and nitrites. Patient was seen by OB/GYN in the ED, blood and urine cultures were collected.  IV fluid was given and patient was admitted to the hospital for further evaluation and treatment.  Assessment/Plan    Intractable pelvic pain  -CT scan without any acute findings.  Currently on pain management.  On IV Dilaudid.  Transition to oral when able.   Vaginal bleeding and discharge   Hemoglobin today at 9.8 from 11.0.  Gynecology recommending antibiotics.  Continue Rocephin and Flagyl.   UTI  Continue Rocephin.  Follow urine culture blood culture-pending.  Urine positive for nitrite leukocytes and more than 50 white cells. Significant leukocytosis.    Peptic ulcer disease - Continue PPI and Carafate     Hypokalemia  Potassium today at 3.4.  Will replace.  Hypomagnesemia.  Will replace.

## 2022-12-29 NOTE — ED Notes (Signed)
Dr. Tyson Babinski notified of fever

## 2022-12-29 NOTE — Progress Notes (Addendum)
PROGRESS NOTE    Holly Brown  AOZ:[id number] DOB: [birth year] DOA: 12/28/2022 PCP: Mort Sawyers, FNP    Brief Narrative:  Holly Brown is a 37 y.o. female with medical history significant for symptomatic fibroids and menorrhagia status post recent hysterectomy, PUD, and migraines who presented to the hospital with lower abdominal pain and vaginal bleeding. Patient underwent vaginal hysterectomy on 12/16/2022 for symptomatic fibroids and menorrhagia, was subsequently admitted for postoperative hematoma, and then admitted again with peptic ulcer.  She reported that her abdominal pain had improved since the recent admission but she continues to have severe pelvic pain despite oral Dilaudid at home.  In the ED,  patient was found to be afebrile and saturating well on room air with tachycardia and stable blood pressure.  Labs were most notable for WBC 20,800, platelets 561,000, and normal lactic acid.  CT demonstrates 7.2 x 7.6 cm fluid collection immediately superior to the vaginal cuff.  Urinalysis was notable for bacteriuria, pyuria, and nitrites. Patient was seen by OB/GYN in the ED, blood and urine cultures were collected.  IV fluid was given and patient was admitted to the hospital for further evaluation and treatment.  Assessment/Plan    Intractable pelvic pain  -CT scan of the abdomen with interval development of complex lobulated fluid collection within the pelvis around 7.2 x 7.6 cm which may present subacute to chronic hematoma.  There is mild edema.  20 within the Morison's pouch and perisplenic space.  Distal ureters were obscured but filling was demonstrated.  Currently on pain management.  On IV Dilaudid, oral narcotics.  Watch closely for excessive sedation currently mildly somnolent.   Vaginal bleeding and discharge   Hemoglobin today at 9.8 from 11.0.  Gynecology on board recommending antibiotics.  Continue Rocephin and Flagyl.  Follow cultures.  Last vaginal bleeding this  morning.   UTI  Continue Rocephin.  Follow urine culture blood culture-pending.  Urine positive for nitrite leukocytes and more than 50 white cells. Significant leukocytosis.  Will trend leukocytosis.  Temperature max of 101.63 Fahrenheit.   Peptic ulcer disease - Continue PPI and Carafate     Hypokalemia  Potassium today at 3.4.  Will replace.  Check BMP in AM.  Hypomagnesemia.  Will replace with IV magnesium sulfate and oral magnesium oxide.  Check levels in AM.      DVT prophylaxis: SCDs Start: 12/29/22 0344   Code Status:     Code Status: Full Code  Disposition: Home likely in 1 to 2 days  Status is: Observation  The patient will require care spanning > 2 midnights and should be moved to inpatient because: Intractable pain requiring multiple IV narcotics, vaginal bleeding, IV antibiotic   Family Communication: Spoke with the patient's mother at bedside  Consultants:  OB/GYN  Procedures:  None yet  Antimicrobials:  Rocephin and Flagyl  Anti-infectives (From admission, onward)    Start     Dose/Rate Route Frequency Ordered Stop   12/29/22 2200  cefTRIAXone (ROCEPHIN) 1 g in sodium chloride 0.9 % 100 mL IVPB        1 g 200 mL/hr over 30 Minutes Intravenous Every 24 hours 12/29/22 0345     12/29/22 1000  metroNIDAZOLE (FLAGYL) IVPB 500 mg        500 mg 100 mL/hr over 60 Minutes Intravenous Every 12 hours 12/29/22 0345     12/29/22 0330  metroNIDAZOLE (FLAGYL) IVPB 500 mg        500 mg 100 mL/hr over 60  Minutes Intravenous  Once 12/29/22 0318 12/29/22 0431   12/28/22 2245  cefTRIAXone (ROCEPHIN) 1 g in sodium chloride 0.9 % 100 mL IVPB        1 g 200 mL/hr over 30 Minutes Intravenous  Once 12/28/22 2241 12/29/22 0009       Subjective: Today, patient was seen and examined at bedside.  Patient's mother at bedside.  Appears to be somnolent but had required 2 mg of IV Dilaudid and oral narcotic this morning.  Complains of lower abdominal pain.  Mother reported that  she did have some vaginal bleeding this morning.  Noted to have a fever of 101.6 today Fahrenheit.  Objective: Vitals:   12/29/22 0800 12/29/22 0900 12/29/22 0927 12/29/22 0929  BP: 104/86  118/67   Pulse: (!) 117 (!) 101 (!) 105 (!) 103  Resp:   16   Temp:      TempSrc:      SpO2: 98% 100% 98% 100%  Weight:      Height:       No intake or output data in the 24 hours ending 12/29/22 1021 Filed Weights   12/28/22 1733  Weight: 108.9 kg    Physical Examination: Body mass index is 34.45 kg/m.   General: Obese built, somnolent at the time of my exam HENT:   No scleral pallor or icterus noted. Oral mucosa is moist.  Chest:   Diminished breath sounds bilaterally. No crackles or wheezes.  CVS: S1 &S2 heard. No murmur.  Regular rate and rhythm. Abdomen: Soft, tenderness over the lower abdomen bowel sounds are heard.   Extremities: No cyanosis, clubbing or edema.  Peripheral pulses are palpable. Psych: Somnolent but mildly Communicative, CNS:  No cranial nerve deficits.  Moves all extremities Skin: Warm and dry.  No rashes noted.  Data Reviewed:   CBC: Recent Labs  Lab 12/22/22 1522 12/24/22 0546 12/28/22 1738 12/29/22 0426  WBC 17.0* 13.0* 20.0* 20.7*  NEUTROABS 13.5*  --   --   --   HGB 11.6* 10.2* 11.0* 9.8*  HCT 34.8* 30.6* 33.9* 30.6*  MCV 90.6 92.2 90.9 92.7  PLT 508* 472* 561* 478*    Basic Metabolic Panel: Recent Labs  Lab 12/22/22 1522 12/24/22 0546 12/28/22 1738 12/29/22 0426  NA 139 139 132* 133*  K 3.0* 3.5 3.4* 3.4*  CL 107 105 100 100  CO2 19* 21* 20* 20*  GLUCOSE 132* 95 160* 139*  BUN 5* 9 6 <5*  CREATININE 1.16* 1.12* 1.13* 0.88  CALCIUM 9.6 9.0 9.0 8.6*  MG  --   --   --  1.6*    Liver Function Tests: Recent Labs  Lab 12/22/22 1522 12/28/22 1738  AST 24 24  ALT 29 20  ALKPHOS 62 63  BILITOT 2.2* 1.4*  PROT 7.3 6.5  ALBUMIN 4.1 3.4*     Radiology Studies: CT ABDOMEN PELVIS W CONTRAST  Addendum Date: 12/29/2022   ADDENDUM  REPORT: 12/29/2022 01:22 ADDENDUM: Delayed images were obtained which demonstrate filling of the mid ureters and bladder but no extravasated contrast to indicated urine leak. Electronically Signed   By: Helyn Numbers M.D.   On: 12/29/2022 01:22   Result Date: 12/29/2022 CLINICAL DATA:  Blunt abdominal trauma, persistent abdominal pain following vaginal hysterectomy 12/16/2022, now with vaginal bleeding. EXAM: CT ABDOMEN AND PELVIS WITH CONTRAST TECHNIQUE: Multidetector CT imaging of the abdomen and pelvis was performed using the standard protocol following bolus administration of intravenous contrast. Delayed images were attempted, but refused by  the patient. RADIATION DOSE REDUCTION: This exam was performed according to the departmental dose-optimization program which includes automated exposure control, adjustment of the mA and/or kV according to patient size and/or use of iterative reconstruction technique. CONTRAST:  75mL OMNIPAQUE IOHEXOL 350 MG/ML SOLN COMPARISON:  12/22/2022 FINDINGS: Lower chest: No acute abnormality. Hepatobiliary: There is focal fatty hepatic infiltration at the falciform ligament. Liver is otherwise unremarkable. Gallbladder unremarkable. No intra or extrahepatic biliary ductal dilation. A small amount of fluid is seen within Morison's pouch, new since prior examination. Pancreas: Unremarkable Spleen: The spleen is normal in size. No intrasplenic lesions are seen. Small perisplenic high attenuation fluid is present in keeping with mild hemoperitoneum, new since prior examination. Adrenals/Urinary Tract: The adrenal glands are unremarkable. The kidneys are normal in size and position demonstrate normal cortical enhancement. No hydronephrosis. No intrarenal or ureteral calculi. There is infiltrative change within the peritoneum superior to the dome of the bladder, however, the bladder itself is unremarkable. The distal ureters are obscured by extensive fluid inflammatory change within the  pelvis. Delayed images were not obtained and continuity of the ureters is not definitively confirmed on this examination though extravasation was not identified on prior exam. Stomach/Bowel: A complex lobulated fluid collection has developed within the pelvis measuring at least 7.2 x 7.6 cm at axial image # 75/3 demonstrating incomplete loculation. Collection is immediately superior to the vaginal cuff and may represent a subacute to chronic hematoma given the high attenuation fluid noted on prior examination in this region. There is new infiltration within the omentum anterior to this collection as well as a small sentinel clot at axial image # 77/3 which may reflect changes of recurrent hemorrhage. The stomach, small bowel, and large bowel are unremarkable. The mid rectum is difficult to discern discretely from the adjacent complex fluid collection within the pelvis, but is otherwise unremarkable. No free intraperitoneal gas. Vascular/Lymphatic: No significant vascular findings are present. No enlarged abdominal or pelvic lymph nodes. Reproductive: Status post hysterectomy. Residual ovaries are unremarkable. The ovarian veins are patent bilaterally. Other: No abdominal wall hernia. Musculoskeletal: No acute or significant osseous findings. IMPRESSION: 1. Interval development of a complex lobulated fluid collection within the pelvis measuring at least 7.2 x 7.6 cm, immediately superior to the vaginal cuff. This may represent a subacute to chronic hematoma given the high attenuation fluid noted on prior examination in this region. There is new infiltration within the omentum anterior to this collection as well as a small sentinel clot which may reflect changes of recurrent hemorrhage in this region. 2. Interval development of mild hemoperitoneum within Morison's pouch and perisplenic space. 3. The distal ureters are obscured by extensive fluid inflammatory change within the pelvis. Delayed images were not obtained  and continuity of the ureters is not definitively confirmed on this examination though extravasation was not identified on prior exam. Electronically Signed: By: Helyn Numbers M.D. On: 12/29/2022 00:42   DG Chest Port 1 View  Result Date: 12/28/2022 CLINICAL DATA:  Questionable sepsis - evaluate for abnormality EXAM: PORTABLE CHEST 1 VIEW COMPARISON:  Chest x-ray 05/18/2013 FINDINGS: The heart and mediastinal contours are within normal limits. No focal consolidation. No pulmonary edema. No pleural effusion. No pneumothorax. No acute osseous abnormality. Indeterminate metallic density measuring up to 1.3 cm overlying the expected region of the distal esophagus. IMPRESSION: 1. Indeterminate metallic density measuring up to 1.3 cm overlying the expected region of the distal esophagus. Correlate with clinical history. Consider repeat PA and lateral view of  the chest. 2. Otherwise no acute cardiopulmonary abnormality. Electronically Signed   By: Tish Frederickson M.D.   On: 12/28/2022 21:12      LOS: 0 days     Joycelyn Das, MD [facility] Available via Epic secure chat 7am-7pm After these hours, please refer to coverage provider listed on [email or web address] 12/29/2022, 10:21 AM

## 2022-12-29 NOTE — ED Notes (Signed)
Ice chips given to pt per request

## 2022-12-30 DIAGNOSIS — R102 Pelvic and perineal pain: Secondary | ICD-10-CM

## 2022-12-30 MED ORDER — POTASSIUM CHLORIDE CRYS ER 20 MEQ PO TBCR
40.0000 meq | EXTENDED_RELEASE_TABLET | Freq: Two times a day (BID) | ORAL | Status: DC
  Administered 2022-12-30 (×2): 40 meq via ORAL
  Filled 2022-12-30 (×2): qty 2

## 2022-12-30 MED ORDER — MAGNESIUM SULFATE 2 GM/50ML IV SOLN
2.0000 g | Freq: Once | INTRAVENOUS | Status: AC
  Administered 2022-12-30: 2 g via INTRAVENOUS
  Filled 2022-12-30: qty 50

## 2022-12-30 NOTE — Progress Notes (Signed)
PROGRESS NOTE    Holly Brown  ZOX:[id number] DOB: 1986-02-11 DOA: 12/28/2022 PCP: Mort Sawyers, FNP    Brief Narrative:   Holly Brown is a 37 y.o. female with medical history significant for symptomatic fibroids and menorrhagia status post recent hysterectomy, PUD, and migraines who presented to the hospital with lower abdominal pain and vaginal bleeding. Patient underwent vaginal hysterectomy on 12/16/2022 for symptomatic fibroids and menorrhagia, was subsequently admitted for postoperative hematoma, and then admitted again with peptic ulcer.  She reported that her abdominal pain had improved since the recent admission but she continues to have severe pelvic pain despite oral Dilaudid at home.  In the ED,  patient was found to be afebrile and saturating well on room air with tachycardia and stable blood pressure.  Labs were most notable for WBC 20,800, platelets 561,000, and normal lactic acid.  CT demonstrates 7.2 x 7.6 cm fluid collection immediately superior to the vaginal cuff.  Urinalysis was notable for bacteriuria, pyuria, and nitrites. Patient was seen by OB/GYN in the ED, blood and urine cultures were collected.  IV fluid was given and patient was admitted to the hospital for further evaluation and treatment.  Assessment/Plan    Intractable pelvic pain  -CT scan of the abdomen with interval development of complex lobulated fluid collection within the pelvis around 7.2 x 7.6 cm which may present subacute to chronic hematoma.    Distal ureters were obscured but filling was demonstrated.  Currently on pain management.  On IV Dilaudid, oral narcotics.  Watch closely for excessive sedation   Vaginal bleeding and discharge   Latest hemoglobin of 9.8 from 11.0.  Gynecology on board and recommend antibiotics.  Continue Rocephin and Flagyl.  Has some dark discharge from vaginal area.   Gram-negative UTI  Urine culture showing gram-negative rods, continue IV Rocephin.  Blood cultures  negative in 2 days.  Patient has significant leukocytosis. Temperature max of 101.6 Fahrenheit the last 24 hours.  Latest temperature of 98.6 F.   Peptic ulcer disease - Continue PPI and Carafate     Hypokalemia  Latest potassium of 3.4.  Has been replenished.  Check labs in AM.  Hypomagnesemia.  Replenished.  Check levels in AM.  Debility weakness.  Will need ambulation in the hospital.  PT evaluation.      DVT prophylaxis: SCDs Start: 12/29/22 0344   Code Status:     Code Status: Full Code  Disposition: Home likely in 1 to 2 days when clinically improved, pain under control.  Status is: Inpatient  The patient is inpatient because: Intractable pain requiring IV narcotics, IV antibiotic, UTI, vaginal bleeding   Family Communication: Spoke with the patient's mother at bedside again today.  Consultants:  OB/GYN  Procedures:  None yet  Antimicrobials:  Rocephin and Flagyl  Anti-infectives (From admission, onward)    Start     Dose/Rate Route Frequency Ordered Stop   12/29/22 2200  cefTRIAXone (ROCEPHIN) 1 g in sodium chloride 0.9 % 100 mL IVPB        1 g 200 mL/hr over 30 Minutes Intravenous Every 24 hours 12/29/22 0345     12/29/22 1000  metroNIDAZOLE (FLAGYL) IVPB 500 mg        500 mg 100 mL/hr over 60 Minutes Intravenous Every 12 hours 12/29/22 0345     12/29/22 0330  metroNIDAZOLE (FLAGYL) IVPB 500 mg        500 mg 100 mL/hr over 60 Minutes Intravenous  Once 12/29/22 0318 12/29/22 0431  12/28/22 2245  cefTRIAXone (ROCEPHIN) 1 g in sodium chloride 0.9 % 100 mL IVPB        1 g 200 mL/hr over 30 Minutes Intravenous  Once 12/28/22 2241 12/29/22 0009       Subjective: Today, patient was seen and examined at bedside.  Complains of fullness in the lower abdomen with pain.  Denies any nausea, vomiting, fever but still feels weak.  Was able to go to the bathroom.  Complains of dark vaginal discharge.  Objective: Vitals:   12/29/22 2333 12/30/22 0344 12/30/22 0536  12/30/22 0818  BP: 118/71 123/61 114/69 (!) 106/57  Pulse: (!) 105 (!) 112 (!) 101 94  Resp: 18 20 17 18   Temp: 99.9 F (37.7 C)  99.7 F (37.6 C) 98.6 F (37 C)  TempSrc: Oral     SpO2: 99% 99% 100% 100%  Weight:      Height:        Intake/Output Summary (Last 24 hours) at 12/30/2022 1323 Last data filed at 12/30/2022 0700 Gross per 24 hour  Intake --  Output 201 ml  Net -201 ml   Filed Weights   12/28/22 1733  Weight: 108.9 kg    Physical Examination: Body mass index is 34.45 kg/m.   General: Obese built, appears weak and deconditioned, mildly somnolent. HENT:   No scleral pallor or icterus noted. Oral mucosa is moist.  Chest:   Diminished breath sounds bilaterally. No crackles or wheezes.  CVS: S1 &S2 heard. No murmur.  Regular rate and rhythm. Abdomen: Soft, tenderness over the lower abdomen over the suprapubic area, bowel sounds are heard.   Extremities: No cyanosis, clubbing or edema.  Peripheral pulses are palpable. Psych: Alert awake and Communicative, CNS:  No cranial nerve deficits.  Moves all extremities. Skin: Warm and dry.  No rashes noted.  Data Reviewed:   CBC: Recent Labs  Lab 12/24/22 0546 12/28/22 1738 12/29/22 0426  WBC 13.0* 20.0* 20.7*  HGB 10.2* 11.0* 9.8*  HCT 30.6* 33.9* 30.6*  MCV 92.2 90.9 92.7  PLT 472* 561* 478*    Basic Metabolic Panel: Recent Labs  Lab 12/24/22 0546 12/28/22 1738 12/29/22 0426  NA 139 132* 133*  K 3.5 3.4* 3.4*  CL 105 100 100  CO2 21* 20* 20*  GLUCOSE 95 160* 139*  BUN 9 6 <5*  CREATININE 1.12* 1.13* 0.88  CALCIUM 9.0 9.0 8.6*  MG  --   --  1.6*    Liver Function Tests: Recent Labs  Lab 12/28/22 1738  AST 24  ALT 20  ALKPHOS 63  BILITOT 1.4*  PROT 6.5  ALBUMIN 3.4*     Radiology Studies: CT ABDOMEN PELVIS W CONTRAST  Addendum Date: 12/29/2022   ADDENDUM REPORT: 12/29/2022 01:22 ADDENDUM: Delayed images were obtained which demonstrate filling of the mid ureters and bladder but no  extravasated contrast to indicated urine leak. Electronically Signed   By: Helyn Numbers M.D.   On: 12/29/2022 01:22   Result Date: 12/29/2022 CLINICAL DATA:  Blunt abdominal trauma, persistent abdominal pain following vaginal hysterectomy 12/16/2022, now with vaginal bleeding. EXAM: CT ABDOMEN AND PELVIS WITH CONTRAST TECHNIQUE: Multidetector CT imaging of the abdomen and pelvis was performed using the standard protocol following bolus administration of intravenous contrast. Delayed images were attempted, but refused by the patient. RADIATION DOSE REDUCTION: This exam was performed according to the departmental dose-optimization program which includes automated exposure control, adjustment of the mA and/or kV according to patient size and/or use of iterative reconstruction  technique. CONTRAST:  75mL OMNIPAQUE IOHEXOL 350 MG/ML SOLN COMPARISON:  12/22/2022 FINDINGS: Lower chest: No acute abnormality. Hepatobiliary: There is focal fatty hepatic infiltration at the falciform ligament. Liver is otherwise unremarkable. Gallbladder unremarkable. No intra or extrahepatic biliary ductal dilation. A small amount of fluid is seen within Morison's pouch, new since prior examination. Pancreas: Unremarkable Spleen: The spleen is normal in size. No intrasplenic lesions are seen. Small perisplenic high attenuation fluid is present in keeping with mild hemoperitoneum, new since prior examination. Adrenals/Urinary Tract: The adrenal glands are unremarkable. The kidneys are normal in size and position demonstrate normal cortical enhancement. No hydronephrosis. No intrarenal or ureteral calculi. There is infiltrative change within the peritoneum superior to the dome of the bladder, however, the bladder itself is unremarkable. The distal ureters are obscured by extensive fluid inflammatory change within the pelvis. Delayed images were not obtained and continuity of the ureters is not definitively confirmed on this examination though  extravasation was not identified on prior exam. Stomach/Bowel: A complex lobulated fluid collection has developed within the pelvis measuring at least 7.2 x 7.6 cm at axial image # 75/3 demonstrating incomplete loculation. Collection is immediately superior to the vaginal cuff and may represent a subacute to chronic hematoma given the high attenuation fluid noted on prior examination in this region. There is new infiltration within the omentum anterior to this collection as well as a small sentinel clot at axial image # 77/3 which may reflect changes of recurrent hemorrhage. The stomach, small bowel, and large bowel are unremarkable. The mid rectum is difficult to discern discretely from the adjacent complex fluid collection within the pelvis, but is otherwise unremarkable. No free intraperitoneal gas. Vascular/Lymphatic: No significant vascular findings are present. No enlarged abdominal or pelvic lymph nodes. Reproductive: Status post hysterectomy. Residual ovaries are unremarkable. The ovarian veins are patent bilaterally. Other: No abdominal wall hernia. Musculoskeletal: No acute or significant osseous findings. IMPRESSION: 1. Interval development of a complex lobulated fluid collection within the pelvis measuring at least 7.2 x 7.6 cm, immediately superior to the vaginal cuff. This may represent a subacute to chronic hematoma given the high attenuation fluid noted on prior examination in this region. There is new infiltration within the omentum anterior to this collection as well as a small sentinel clot which may reflect changes of recurrent hemorrhage in this region. 2. Interval development of mild hemoperitoneum within Morison's pouch and perisplenic space. 3. The distal ureters are obscured by extensive fluid inflammatory change within the pelvis. Delayed images were not obtained and continuity of the ureters is not definitively confirmed on this examination though extravasation was not identified on prior  exam. Electronically Signed: By: Helyn Numbers M.D. On: 12/29/2022 00:42   DG Chest Port 1 View  Result Date: 12/28/2022 CLINICAL DATA:  Questionable sepsis - evaluate for abnormality EXAM: PORTABLE CHEST 1 VIEW COMPARISON:  Chest x-ray 05/18/2013 FINDINGS: The heart and mediastinal contours are within normal limits. No focal consolidation. No pulmonary edema. No pleural effusion. No pneumothorax. No acute osseous abnormality. Indeterminate metallic density measuring up to 1.3 cm overlying the expected region of the distal esophagus. IMPRESSION: 1. Indeterminate metallic density measuring up to 1.3 cm overlying the expected region of the distal esophagus. Correlate with clinical history. Consider repeat PA and lateral view of the chest. 2. Otherwise no acute cardiopulmonary abnormality. Electronically Signed   By: Tish Frederickson M.D.   On: 12/28/2022 21:12      LOS: 1 day     Crystalle Popwell  Sundi Slevin, MD [facility] Available via Epic secure chat 7am-7pm After these hours, please refer to coverage provider listed on [email or web address] 12/30/2022, 1:23 PM

## 2022-12-30 NOTE — Plan of Care (Signed)
  Problem: Education: Goal: Knowledge of the prescribed therapeutic regimen will improve Outcome: Progressing   Problem: Skin Integrity: Goal: Demonstration of wound healing without infection will improve Outcome: Progressing   Problem: Education: Goal: Knowledge of General Education information will improve Description: Including pain rating scale, medication(s)/side effects and non-pharmacologic comfort measures Outcome: Progressing

## 2022-12-31 DIAGNOSIS — N9489 Other specified conditions associated with female genital organs and menstrual cycle: Secondary | ICD-10-CM | POA: Diagnosis not present

## 2022-12-31 DIAGNOSIS — R102 Pelvic and perineal pain: Secondary | ICD-10-CM | POA: Diagnosis not present

## 2022-12-31 DIAGNOSIS — N3000 Acute cystitis without hematuria: Secondary | ICD-10-CM | POA: Diagnosis not present

## 2022-12-31 DIAGNOSIS — C539 Malignant neoplasm of cervix uteri, unspecified: Secondary | ICD-10-CM | POA: Diagnosis not present

## 2022-12-31 LAB — CBC
HCT: 30 % — ABNORMAL LOW (ref 36.0–46.0)
Hemoglobin: 9.9 g/dL — ABNORMAL LOW (ref 12.0–15.0)
MCH: 29.2 pg (ref 26.0–34.0)
MCHC: 33 g/dL (ref 30.0–36.0)
MCV: 88.5 fL (ref 80.0–100.0)
Platelets: 650 10*3/uL — ABNORMAL HIGH (ref 150–400)
RBC: 3.39 MIL/uL — ABNORMAL LOW (ref 3.87–5.11)
RDW: 13 % (ref 11.5–15.5)
WBC: 21.6 10*3/uL — ABNORMAL HIGH (ref 4.0–10.5)
nRBC: 0 % (ref 0.0–0.2)

## 2022-12-31 LAB — URINE CULTURE: Culture: >=100000 — AB

## 2022-12-31 LAB — BASIC METABOLIC PANEL
Anion gap: 13 (ref 5–15)
BUN: <5 mg/dL — ABNORMAL LOW (ref 6–20)
CO2: 21 mmol/L — ABNORMAL LOW (ref 22–32)
Calcium: 8.9 mg/dL (ref 8.9–10.3)
Chloride: 99 mmol/L (ref 98–111)
Creatinine, Ser: 0.77 mg/dL (ref 0.44–1.00)
GFR, Estimated: >60 mL/min (ref >60)
Glucose, Bld: 137 mg/dL — ABNORMAL HIGH (ref 70–99)
Potassium: 3.3 mmol/L — ABNORMAL LOW (ref 3.5–5.1)
Sodium: 133 mmol/L — ABNORMAL LOW (ref 135–145)

## 2022-12-31 MED ORDER — HYDROMORPHONE HCL 2 MG PO TABS
1.0000 mg | ORAL_TABLET | ORAL | Status: DC | PRN
  Administered 2022-12-31: 1 mg via ORAL
  Filled 2022-12-31: qty 1

## 2022-12-31 MED ORDER — POTASSIUM CHLORIDE CRYS ER 20 MEQ PO TBCR
40.0000 meq | EXTENDED_RELEASE_TABLET | Freq: Every day | ORAL | Status: AC
  Administered 2022-12-31: 40 meq via ORAL
  Filled 2022-12-31: qty 2

## 2022-12-31 NOTE — Progress Notes (Signed)
Transition of Care [facility]) - Inpatient Brief Assessment   Patient Details  Name: Holly Brown MRN: [id number] Date of Birth: [birth year]  Transition of Care [facility] CM/SW Contact:    Janae Bridgeman, RN Phone Number: 12/31/2022, 3:35 PM   Clinical Narrative: Patient admitted to intractable pelvic pain and vaginal bleeding.  Patient evaluated by PT today and may need RW/ Outpatient therapy set up closer to discharge - pending patient's medical/physical improvement.  CM will continue to follow the patient for TOC needs.   Transition of Care Asessment: Insurance and Status: Insurance coverage has been reviewed Patient has primary care physician: Yes Home environment has been reviewed: From home Prior level of function:: Independent Prior/Current Home Services: No current home services Social Determinants of Health Reivew: SDOH reviewed needs interventions Readmission risk has been reviewed: Yes Transition of care needs: transition of care needs identified, TOC will continue to follow

## 2022-12-31 NOTE — Progress Notes (Signed)
PROGRESS NOTE    Holly Brown  VHQ:[id number] DOB: 22-Nov-1985 DOA: 12/28/2022 PCP: Mort Sawyers, FNP    Brief Narrative:   Holly Brown is a 37 y.o. female with medical history significant for symptomatic fibroids and menorrhagia status post recent hysterectomy, PUD, and migraines who presented to the hospital with lower abdominal pain and vaginal bleeding. Patient underwent vaginal hysterectomy on 12/16/2022 for symptomatic fibroids and menorrhagia, was subsequently admitted for postoperative hematoma, and then admitted again with peptic ulcer.  She reported that her abdominal pain had improved since the recent admission but she continues to have severe pelvic pain despite oral Dilaudid at home.  In the ED,  patient was found to be afebrile and saturating well on room air with tachycardia and stable blood pressure.  Labs were most notable for WBC 20,800, platelets 561,000, and normal lactic acid.  CT demonstrates 7.2 x 7.6 cm fluid collection immediately superior to the vaginal cuff.  Urinalysis was notable for bacteriuria, pyuria, and nitrites. Patient was seen by OB/GYN in the ED, blood and urine cultures were collected.  IV fluid was given and patient was admitted to the hospital for further evaluation and treatment.  Assessment/Plan    Intractable pelvic pain  CT scan of the abdomen with interval development of complex lobulated fluid collection within the pelvis around 7.2 x 7.6 cm which may present subacute to chronic hematoma.    Distal ureters were obscured but filling was demonstrated.  Patient was seen by OB/GYN who recommended antibiotics and conservative treatment.  Currently on pain management.  On IV Dilaudid, oral narcotics.  Watch closely for excessive sedation.   Vaginal bleeding and discharge   Latest hemoglobin of 9.9 from 9.8< 11.0.  Gynecology on board and recommend antibiotics.  On Rocephin and Flagyl.  Has some dark discharge from vaginal area which is likely to  happen as per OB/GYN.  Will transition to Augmentin on discharge.  Pansensitive E. coli UTI  Urine culture showing gram-negative rods, continue IV Rocephin.  Blood cultures negative in 2 days.  Patient has significant leukocytosis. Temperature max of 99.8 F last 24 hours.  Improved.    Peptic ulcer disease - Continue PPI and Carafate     Hypokalemia  Latest potassium of 3.3.  Continue to replace.  Check levels in AM.  Hypomagnesemia.  Replenished.  Check levels in AM.  Debility weakness.  Will need ambulation in the hospital.  PT evaluation pending.  Patient feels awake and is lying in bed.  Encouraged out of bed.      DVT prophylaxis: SCDs Start: 12/29/22 0344   Code Status:     Code Status: Full Code  Disposition: Home likely in 1 to 2 days when clinically improved,  Status is: Inpatient  The patient is inpatient because:  IV narcotics, IV antibiotic, UTI, vaginal bleeding   Family Communication: Spoke with the patient's mother at bedside on 12/30/2022  Consultants:  OB/GYN  Procedures:  None yet  Antimicrobials:  Rocephin and Flagyl IV  Anti-infectives (From admission, onward)    Start     Dose/Rate Route Frequency Ordered Stop   12/29/22 2200  cefTRIAXone (ROCEPHIN) 1 g in sodium chloride 0.9 % 100 mL IVPB        1 g 200 mL/hr over 30 Minutes Intravenous Every 24 hours 12/29/22 0345     12/29/22 1000  metroNIDAZOLE (FLAGYL) IVPB 500 mg        500 mg 100 mL/hr over 60 Minutes Intravenous Every 12  hours 12/29/22 0345     12/29/22 0330  metroNIDAZOLE (FLAGYL) IVPB 500 mg        500 mg 100 mL/hr over 60 Minutes Intravenous  Once 12/29/22 0318 12/29/22 0431   12/28/22 2245  cefTRIAXone (ROCEPHIN) 1 g in sodium chloride 0.9 % 100 mL IVPB        1 g 200 mL/hr over 30 Minutes Intravenous  Once 12/28/22 2241 12/29/22 0009       Subjective: Today, patient was seen and examined at bedside.  Still has some brownish discharge.  Has pressure in the lower abdomen.   Fever noted below but he still complains of lower abdominal pain, weakness.    Objective: Vitals:   12/30/22 1949 12/31/22 0450 12/31/22 0559 12/31/22 0818  BP: 111/61 138/85 (!) 143/84 136/80  Pulse: (!) 104 (!) 111 (!) 105 (!) 110  Resp:  20 18   Temp:  98.8 F (37.1 C) 98.9 F (37.2 C) 99.8 F (37.7 C)  TempSrc:    Oral  SpO2: 100% 100% 100% 99%  Weight:      Height:        Intake/Output Summary (Last 24 hours) at 12/31/2022 1343 Last data filed at 12/30/2022 2330 Gross per 24 hour  Intake 940 ml  Output --  Net 940 ml   Filed Weights   12/28/22 1733  Weight: 108.9 kg    Physical Examination: Body mass index is 34.45 kg/m.   General: Obese built, appears weak and deconditioned, HENT:   No scleral pallor or icterus noted. Oral mucosa is moist.  Chest:   Diminished breath sounds bilaterally. No crackles or wheezes.  CVS: S1 &S2 heard. No murmur.  Regular rate and rhythm. Abdomen: Soft, bowel sounds are heard.  Diffuse tenderness over the abdomen mostly over the lower side. Extremities: No cyanosis, clubbing or edema.  Peripheral pulses are palpable. Psych: Alert awake and oriented.  Flat affect. CNS:  No cranial nerve deficits.  Moves all extremities. Skin: Warm and dry.  No rashes noted.  Data Reviewed:   CBC: Recent Labs  Lab 12/28/22 1738 12/29/22 0426 12/31/22 0554  WBC 20.0* 20.7* 21.6*  HGB 11.0* 9.8* 9.9*  HCT 33.9* 30.6* 30.0*  MCV 90.9 92.7 88.5  PLT 561* 478* 650*    Basic Metabolic Panel: Recent Labs  Lab 12/28/22 1738 12/29/22 0426 12/31/22 0554  NA 132* 133* 133*  K 3.4* 3.4* 3.3*  CL 100 100 99  CO2 20* 20* 21*  GLUCOSE 160* 139* 137*  BUN 6 <5* <5*  CREATININE 1.13* 0.88 0.77  CALCIUM 9.0 8.6* 8.9  MG  --  1.6*  --     Liver Function Tests: Recent Labs  Lab 12/28/22 1738  AST 24  ALT 20  ALKPHOS 63  BILITOT 1.4*  PROT 6.5  ALBUMIN 3.4*     Radiology Studies: No results found.    LOS: 2 days     Joycelyn Das, MD [facility] Available via Epic secure chat 7am-7pm After these hours, please refer to coverage provider listed on [email or web address] 12/31/2022, 1:43 PM

## 2022-12-31 NOTE — Evaluation (Signed)
Physical Therapy Evaluation Patient Details Name: Holly Brown MRN: [id number] DOB: Jul 10, 1985 Today's Date: 12/31/2022  History of Present Illness  37 y.o. female presents to [facility] hospital on 12/28/2022 with lower abdominal pain and vaginal bleeding, recent admission with vaginal hysterectomy and postop hematoma. CT abdomen/pelvis with fluid collection superior to vaginal cuff. Concern for UTI. PMH includes symptomatic fibroids and menorrhagia, PUD, migraines.  Clinical Impression  Pt presents to PT with deficits in gait, power, endurance, limited by lower abdominal pain. Pt reports improved comfort when ambulating with UE support of RW. Pt is encouraged to mobilize frequently in an effort to improve activity tolerance. Pt may benefit from outpatient pelvic health PT at the time of discharge.        If plan is discharge home, recommend the following: A little help with bathing/dressing/bathroom;Assistance with cooking/housework;Assist for transportation;Help with stairs or ramp for entrance   Can travel by private vehicle        Equipment Recommendations Rolling walker (2 wheels) (may progress to no needs)  Recommendations for Other Services       Functional Status Assessment Patient has had a recent decline in their functional status and demonstrates the ability to make significant improvements in function in a reasonable and predictable amount of time.     Precautions / Restrictions Precautions Precautions: Fall Restrictions Weight Bearing Restrictions: No      Mobility  Bed Mobility                    Transfers Overall transfer level: Needs assistance Equipment used: None Transfers: Sit to/from Stand Sit to Stand: Supervision                Ambulation/Gait Ambulation/Gait assistance: Supervision Gait Distance (Feet): 120 Feet Assistive device: Rolling walker (2 wheels) Gait Pattern/deviations: Step-through pattern Gait velocity: reduced Gait velocity  interpretation: <1.8 ft/sec, indicate of risk for recurrent falls   General Gait Details: slowed step-through gait, increased time in dual stance phase. reduced gait speed, increased trunk flexion  Stairs            Wheelchair Mobility     Tilt Bed    Modified Rankin (Stroke Patients Only)       Balance Overall balance assessment: Needs assistance Sitting-balance support: No upper extremity supported, Feet supported Sitting balance-Leahy Scale: Good     Standing balance support: Single extremity supported, Reliant on assistive device for balance Standing balance-Leahy Scale: Poor                               Pertinent Vitals/Pain Pain Assessment Pain Assessment: Faces Faces Pain Scale: Hurts even more Pain Location: abdomen Pain Descriptors / Indicators: Grimacing Pain Intervention(s): Monitored during session    Home Living Family/patient expects to be discharged to:: Private residence Living Arrangements: Parent;Children Available Help at Discharge: Family;Available PRN/intermittently Type of Home: House Home Access: Stairs to enter Entrance Stairs-Rails: Can reach both Entrance Stairs-Number of Steps: 4   Home Layout: One level Home Equipment: None      Prior Function Prior Level of Function : Independent/Modified Independent;Driving                     Extremity/Trunk Assessment   Upper Extremity Assessment Upper Extremity Assessment: Overall WFL for tasks assessed    Lower Extremity Assessment Lower Extremity Assessment: Generalized weakness    Cervical / Trunk Assessment Cervical / Trunk Assessment: Normal;Other exceptions (  pt tends to flex at hips when standing, reports improved comfort at abdomen)  Communication   Communication Communication: No apparent difficulties Cueing Techniques: Verbal cues  Cognition Arousal: Alert Behavior During Therapy: WFL for tasks assessed/performed Overall Cognitive Status: Within  Functional Limits for tasks assessed                                          General Comments General comments (skin integrity, edema, etc.): VSS on RA    Exercises     Assessment/Plan    PT Assessment Patient needs continued PT services  PT Problem List Decreased activity tolerance;Decreased balance;Decreased mobility;Pain       PT Treatment Interventions DME instruction;Gait training;Stair training;Functional mobility training;Balance training;Patient/family education    PT Goals (Current goals can be found in the Care Plan section)  Acute Rehab PT Goals Patient Stated Goal: to reduce abdominal pain PT Goal Formulation: With patient Time For Goal Achievement: 01/14/23 Potential to Achieve Goals: Fair    Frequency Min 1X/week     Co-evaluation               AM-PAC PT "6 Clicks" Mobility  Outcome Measure Help needed turning from your back to your side while in a flat bed without using bedrails?: A Little Help needed moving from lying on your back to sitting on the side of a flat bed without using bedrails?: A Little Help needed moving to and from a bed to a chair (including a wheelchair)?: A Little Help needed standing up from a chair using your arms (e.g., wheelchair or bedside chair)?: A Little Help needed to walk in hospital room?: A Little Help needed climbing 3-5 steps with a railing? : A Little 6 Click Score: 18    End of Session   Activity Tolerance: Patient tolerated treatment well Patient left: in bed;with call bell/phone within reach;with family/visitor present Nurse Communication: Mobility status PT Visit Diagnosis: Other abnormalities of gait and mobility (R26.89);Pain Pain - part of body:  (abdomen)    Time: 5638-7564 PT Time Calculation (min) (ACUTE ONLY): 21 min   Charges:   PT Evaluation $PT Eval Low Complexity: 1 Low   PT General Charges $$ ACUTE PT VISIT: 1 Visit         Arlyss Gandy, PT, DPT Acute  Rehabilitation Office [phone]   Arlyss Gandy 12/31/2022, 3:06 PM

## 2022-12-31 NOTE — Plan of Care (Signed)
  Problem: Self-Concept: Goal: Communication of feelings regarding changes in body function or appearance will improve Outcome: Progressing   Problem: Skin Integrity: Goal: Demonstration of wound healing without infection will improve Outcome: Progressing   Problem: Health Behavior/Discharge Planning: Goal: Ability to manage health-related needs will improve Outcome: Progressing

## 2023-01-01 ENCOUNTER — Other Ambulatory Visit (HOSPITAL_COMMUNITY): Payer: Self-pay

## 2023-01-01 DIAGNOSIS — R109 Unspecified abdominal pain: Secondary | ICD-10-CM | POA: Diagnosis not present

## 2023-01-01 DIAGNOSIS — N9489 Other specified conditions associated with female genital organs and menstrual cycle: Secondary | ICD-10-CM | POA: Diagnosis not present

## 2023-01-01 DIAGNOSIS — C539 Malignant neoplasm of cervix uteri, unspecified: Secondary | ICD-10-CM | POA: Diagnosis not present

## 2023-01-01 DIAGNOSIS — N3000 Acute cystitis without hematuria: Secondary | ICD-10-CM | POA: Diagnosis not present

## 2023-01-01 LAB — CBC
HCT: 27.5 % — ABNORMAL LOW (ref 36.0–46.0)
Hemoglobin: 9.1 g/dL — ABNORMAL LOW (ref 12.0–15.0)
MCH: 30.2 pg (ref 26.0–34.0)
MCHC: 33.1 g/dL (ref 30.0–36.0)
MCV: 91.4 fL (ref 80.0–100.0)
Platelets: 539 10*3/uL — ABNORMAL HIGH (ref 150–400)
RBC: 3.01 MIL/uL — ABNORMAL LOW (ref 3.87–5.11)
RDW: 13.2 % (ref 11.5–15.5)
WBC: 14.3 10*3/uL — ABNORMAL HIGH (ref 4.0–10.5)
nRBC: 0 % (ref 0.0–0.2)

## 2023-01-01 LAB — BASIC METABOLIC PANEL
Anion gap: 12 (ref 5–15)
BUN: <5 mg/dL — ABNORMAL LOW (ref 6–20)
CO2: 24 mmol/L (ref 22–32)
Calcium: 8.7 mg/dL — ABNORMAL LOW (ref 8.9–10.3)
Chloride: 99 mmol/L (ref 98–111)
Creatinine, Ser: 0.8 mg/dL (ref 0.44–1.00)
GFR, Estimated: >60 mL/min (ref >60)
Glucose, Bld: 123 mg/dL — ABNORMAL HIGH (ref 70–99)
Potassium: 3.1 mmol/L — ABNORMAL LOW (ref 3.5–5.1)
Sodium: 135 mmol/L (ref 135–145)

## 2023-01-01 MED ORDER — POTASSIUM CHLORIDE CRYS ER 20 MEQ PO TBCR
20.0000 meq | EXTENDED_RELEASE_TABLET | Freq: Every day | ORAL | 0 refills | Status: DC
  Filled 2023-01-01: qty 10, 10d supply, fill #0

## 2023-01-01 MED ORDER — ACETAMINOPHEN 325 MG PO TABS
650.0000 mg | ORAL_TABLET | Freq: Four times a day (QID) | ORAL | 0 refills | Status: DC | PRN
Start: 2023-01-01 — End: 2023-01-07
  Filled 2023-01-01: qty 30, 4d supply, fill #0

## 2023-01-01 MED ORDER — POLYETHYLENE GLYCOL 3350 17 G PO PACK
17.0000 g | PACK | Freq: Every day | ORAL | 0 refills | Status: DC | PRN
  Filled 2023-01-01: qty 14, 14d supply, fill #0

## 2023-01-01 MED ORDER — POTASSIUM CHLORIDE CRYS ER 20 MEQ PO TBCR: 40.0000 meq | EXTENDED_RELEASE_TABLET | Freq: Once | ORAL | Status: DC

## 2023-01-01 MED ORDER — AMOXICILLIN-POT CLAVULANATE 875-125 MG PO TABS
1.0000 | ORAL_TABLET | Freq: Two times a day (BID) | ORAL | 0 refills | Status: AC
  Filled 2023-01-01: qty 10, 5d supply, fill #0

## 2023-01-01 NOTE — TOC Transition Note (Addendum)
Transition of Care [facility]) - CM/SW Discharge Note   Patient Details  Name: Holly Brown MRN: [id number] Date of Birth: 1985/06/11  [facility] CM/SW Contact:  Janae Bridgeman, RN Phone Number: 01/01/2023, 11:02 AM   Clinical Narrative:    CM met with the patient prior to discharge to home.  The patient lives with her mother and 5 minor children at the home and plans to return home after discharge.  Patient has no DME at the home.  I offered choice regarding DME and patient did not have a preference.  I called [employer] and ordered a RW to be delivered to the hospital room prior to discharge today.  Patient was agreeable to Outpatient therapy.  Referral was placed for Outpatient Therapy at [facility] clinic and MD was requested to co-sign.  No other TOC needs.  Patient plans to call her mother to provide transportation to home via family car.  01/01/23 1500 - Rw was not delivered to the hospital room.  Patient states that she would like to go home and have the RW delivered to her home.  I called [employer] and [facility], CM states that they will deliver to the home tonight.         Patient Goals and CMS Choice      Discharge Placement                         Discharge Plan and Services Additional resources added to the After Visit Summary for                                       Social Determinants of Health (SDOH) Interventions SDOH Screenings   Food Insecurity: No Food Insecurity (12/29/2022)  Housing: Low Risk  (12/29/2022)  Transportation Needs: No Transportation Needs (12/29/2022)  Utilities: Not At Risk (12/29/2022)  Depression (PHQ2-9): Low Risk  (12/10/2022)  Financial Resource Strain: Low Risk  (02/01/2018)  Social Connections: Unknown (08/31/2018)  Stress: No Stress Concern Present (08/17/2018)  Tobacco Use: Medium Risk (12/29/2022)     Readmission Risk Interventions    12/31/2022    3:33 PM  Readmission Risk Prevention Plan   Transportation Screening Complete  PCP or Specialist Appt within 5-7 Days Complete  Home Care Screening Complete  Medication Review (RN CM) Complete

## 2023-01-01 NOTE — Progress Notes (Signed)
Pt has no questions about AVS. Picking up meds at OP pharmacy. Iv removed. Primary RN transported pt outside to car.

## 2023-01-01 NOTE — Progress Notes (Signed)
Discharge instructions (including medications) discussed with and copy provided to patient/caregiver 

## 2023-01-01 NOTE — Discharge Summary (Signed)
Physician Discharge Summary  Holly Brown UEA:[id number] DOB: [birth year] DOA: 12/28/2022  PCP: Mort Sawyers, FNP  Admit date: 12/28/2022 Discharge date: 01/01/2023  Admitted From: Home  Discharge disposition: Home   Recommendations for Outpatient Follow-Up:   Follow up with your primary care provider in one week.  Check CBC, BMP, magnesium in the next visit Follow-up with Dr. Alysia Penna gynecology on 01/07/2023  Discharge Diagnosis:   Principal Problem:   UTI (urinary tract infection) Active Problems:   Pelvic hematoma in female   S/P hysterectomy   Cervical cancer (HCC)   Postoperative vaginal bleeding following genitourinary procedure   Intractable abdominal pain   Pelvic pain   Discharge Condition: Improved.  Diet recommendation:   Regular.  Wound care: None.  Code status: Full.   History of Present Illness:   Holly Brown is a 37 y.o. female with medical history significant for symptomatic fibroids and menorrhagia status post recent hysterectomy, PUD, and migraines who presented to the hospital with lower abdominal pain and vaginal bleeding. Patient underwent vaginal hysterectomy on 12/16/2022 for symptomatic fibroids and menorrhagia, was subsequently admitted for postoperative hematoma, and then admitted again with peptic ulcer.  She reported that her abdominal pain had improved since the recent admission but she continues to have severe pelvic pain despite oral Dilaudid at home.  In the ED,  patient was found to be afebrile and saturating well on room air with tachycardia and stable blood pressure.  Labs were most notable for WBC 20,800, platelets 561,000, and normal lactic acid.  CT demonstrates 7.2 x 7.6 cm fluid collection immediately superior to the vaginal cuff.  Urinalysis was notable for bacteriuria, pyuria, and nitrites. Patient was seen by OB/GYN in the ED, blood and urine cultures were collected.  Patient was given IV fluid was given and patient was admitted  to the hospital for further evaluation and treatment.   Hospital Course:   Following conditions were addressed during hospitalization as listed below,  Intractable pelvic pain  Improved at this time.  CT scan of the abdomen with interval development of complex lobulated fluid collection within the pelvis around 7.2 x 7.6 cm which may present subacute to chronic hematoma.    Distal ureters were obscured but filling was demonstrated.  Patient was seen by OB/GYN who recommended antibiotics and conservative treatment.  Was recommended 7-day course of Augmentin on discharge.  Patient received IV Rocephin and Flagyl during hospitalization.  Hospitalization patient was focused on pain management and electrolytes replacement and hydration.   Will continue Augmentin for 5 more days on discharge with gynecology follow-up that has been scheduled.     Vaginal bleeding and discharge   Latest hemoglobin of 9.1 from initial.  11.0.    Has some dark discharge from vaginal area which is likely to happen as per OB/GYN.  Will follow-up with gynecology as outpatient.   Pansensitive E. coli UTI  Urine culture showing gram-negative rods, received IV Rocephin during hospitalization.  Blood cultures negative in 4 days.  Patient did have significant leukocytosis has trended down to 14.3 today from 21K yesterday..  Patient is afebrile.   Peptic ulcer disease - Continue PPI and Carafate     Hypokalemia  Latest potassium of 3.1.  40 mEq of potassium now and continue 20 mEq for next 10 days on discharge.  Recommend outpatient follow-up with PCP  Hypomagnesemia.  Replenished.     Debility weakness.  Encouraged ambulation.  PT recommends outpatient PT on discharge.  Disposition.  At  this time, patient is stable for disposition home with outpatient PCP and gynecology follow-up  Medical Consultants:   Gynecology  Procedures:    None Subjective:   Today, patient seen and examined at bedside.  Still complains of  lower back and lower abdominal pain.  No chills or rigor.  Worried about her current situation.  Discharge Exam:   Vitals:   12/31/22 1938 01/01/23 0454  BP: 123/71 123/71  Pulse: (!) 103 89  Resp: 18 18  Temp: 98.9 F (37.2 C) 98.8 F (37.1 C)  SpO2: 100% 100%   Vitals:   12/31/22 0559 12/31/22 0818 12/31/22 1938 01/01/23 0454  BP: (!) 143/84 136/80 123/71 123/71  Pulse: (!) 105 (!) 110 (!) 103 89  Resp: 18  18 18   Temp: 98.9 F (37.2 C) 99.8 F (37.7 C) 98.9 F (37.2 C) 98.8 F (37.1 C)  TempSrc:  Oral Oral Oral  SpO2: 100% 99% 100% 100%  Weight:    97.9 kg  Height:       Body mass index is 30.97 kg/m.  General: Alert awake, obese, not in obvious distress, Communicative, HENT: pupils equally reacting to light,  No scleral pallor or icterus noted. Oral mucosa is moist.  Chest: Diminished breath sounds bilaterally. No crackles or wheezes.  CVS: S1 &S2 heard. No murmur.  Regular rate and rhythm. Abdomen: Soft, mild lower abdominal tenderness nondistended.  Bowel sounds are heard.   Extremities: No cyanosis, clubbing or edema.  Peripheral pulses are palpable. Psych: Alert, awake and oriented, flat affect. CNS:  No cranial nerve deficits.  Power equal in all extremities.   Skin: Warm and dry.  No rashes noted.  The results of significant diagnostics from this hospitalization (including imaging, microbiology, ancillary and laboratory) are listed below for reference.     Diagnostic Studies:   CT ABDOMEN PELVIS W CONTRAST  Addendum Date: 12/29/2022   ADDENDUM REPORT: 12/29/2022 01:22 ADDENDUM: Delayed images were obtained which demonstrate filling of the mid ureters and bladder but no extravasated contrast to indicated urine leak. Electronically Signed   By: Helyn Numbers M.D.   On: 12/29/2022 01:22   Result Date: 12/29/2022 CLINICAL DATA:  Blunt abdominal trauma, persistent abdominal pain following vaginal hysterectomy 12/16/2022, now with vaginal bleeding. EXAM: CT  ABDOMEN AND PELVIS WITH CONTRAST TECHNIQUE: Multidetector CT imaging of the abdomen and pelvis was performed using the standard protocol following bolus administration of intravenous contrast. Delayed images were attempted, but refused by the patient. RADIATION DOSE REDUCTION: This exam was performed according to the departmental dose-optimization program which includes automated exposure control, adjustment of the mA and/or kV according to patient size and/or use of iterative reconstruction technique. CONTRAST:  75mL OMNIPAQUE IOHEXOL 350 MG/ML SOLN COMPARISON:  12/22/2022 FINDINGS: Lower chest: No acute abnormality. Hepatobiliary: There is focal fatty hepatic infiltration at the falciform ligament. Liver is otherwise unremarkable. Gallbladder unremarkable. No intra or extrahepatic biliary ductal dilation. A small amount of fluid is seen within Morison's pouch, new since prior examination. Pancreas: Unremarkable Spleen: The spleen is normal in size. No intrasplenic lesions are seen. Small perisplenic high attenuation fluid is present in keeping with mild hemoperitoneum, new since prior examination. Adrenals/Urinary Tract: The adrenal glands are unremarkable. The kidneys are normal in size and position demonstrate normal cortical enhancement. No hydronephrosis. No intrarenal or ureteral calculi. There is infiltrative change within the peritoneum superior to the dome of the bladder, however, the bladder itself is unremarkable. The distal ureters are obscured by extensive fluid inflammatory change  within the pelvis. Delayed images were not obtained and continuity of the ureters is not definitively confirmed on this examination though extravasation was not identified on prior exam. Stomach/Bowel: A complex lobulated fluid collection has developed within the pelvis measuring at least 7.2 x 7.6 cm at axial image # 75/3 demonstrating incomplete loculation. Collection is immediately superior to the vaginal cuff and may  represent a subacute to chronic hematoma given the high attenuation fluid noted on prior examination in this region. There is new infiltration within the omentum anterior to this collection as well as a small sentinel clot at axial image # 77/3 which may reflect changes of recurrent hemorrhage. The stomach, small bowel, and large bowel are unremarkable. The mid rectum is difficult to discern discretely from the adjacent complex fluid collection within the pelvis, but is otherwise unremarkable. No free intraperitoneal gas. Vascular/Lymphatic: No significant vascular findings are present. No enlarged abdominal or pelvic lymph nodes. Reproductive: Status post hysterectomy. Residual ovaries are unremarkable. The ovarian veins are patent bilaterally. Other: No abdominal wall hernia. Musculoskeletal: No acute or significant osseous findings. IMPRESSION: 1. Interval development of a complex lobulated fluid collection within the pelvis measuring at least 7.2 x 7.6 cm, immediately superior to the vaginal cuff. This may represent a subacute to chronic hematoma given the high attenuation fluid noted on prior examination in this region. There is new infiltration within the omentum anterior to this collection as well as a small sentinel clot which may reflect changes of recurrent hemorrhage in this region. 2. Interval development of mild hemoperitoneum within Morison's pouch and perisplenic space. 3. The distal ureters are obscured by extensive fluid inflammatory change within the pelvis. Delayed images were not obtained and continuity of the ureters is not definitively confirmed on this examination though extravasation was not identified on prior exam. Electronically Signed: By: Helyn Numbers M.D. On: 12/29/2022 00:42   DG Chest Port 1 View  Result Date: 12/28/2022 CLINICAL DATA:  Questionable sepsis - evaluate for abnormality EXAM: PORTABLE CHEST 1 VIEW COMPARISON:  Chest x-ray 05/18/2013 FINDINGS: The heart and  mediastinal contours are within normal limits. No focal consolidation. No pulmonary edema. No pleural effusion. No pneumothorax. No acute osseous abnormality. Indeterminate metallic density measuring up to 1.3 cm overlying the expected region of the distal esophagus. IMPRESSION: 1. Indeterminate metallic density measuring up to 1.3 cm overlying the expected region of the distal esophagus. Correlate with clinical history. Consider repeat PA and lateral view of the chest. 2. Otherwise no acute cardiopulmonary abnormality. Electronically Signed   By: Tish Frederickson M.D.   On: 12/28/2022 21:12     Labs:   Basic Metabolic Panel: Recent Labs  Lab 12/28/22 1738 12/29/22 0426 12/31/22 0554 01/01/23 0809  NA 132* 133* 133* 135  K 3.4* 3.4* 3.3* 3.1*  CL 100 100 99 99  CO2 20* 20* 21* 24  GLUCOSE 160* 139* 137* 123*  BUN 6 <5* <5* <5*  CREATININE 1.13* 0.88 0.77 0.80  CALCIUM 9.0 8.6* 8.9 8.7*  MG  --  1.6*  --   --    GFR Estimated Creatinine Clearance: 124.4 mL/min (by C-G formula based on SCr of 0.8 mg/dL). Liver Function Tests: Recent Labs  Lab 12/28/22 1738  AST 24  ALT 20  ALKPHOS 63  BILITOT 1.4*  PROT 6.5  ALBUMIN 3.4*   Recent Labs  Lab 12/28/22 1738  LIPASE 21   No results for input(s): "AMMONIA" in the last 168 hours. Coagulation profile Recent Labs  Lab  12/28/22 2248  INR 1.0    CBC: Recent Labs  Lab 12/28/22 1738 12/29/22 0426 12/31/22 0554 01/01/23 0809  WBC 20.0* 20.7* 21.6* 14.3*  HGB 11.0* 9.8* 9.9* 9.1*  HCT 33.9* 30.6* 30.0* 27.5*  MCV 90.9 92.7 88.5 91.4  PLT 561* 478* 650* 539*   Cardiac Enzymes: No results for input(s): "CKTOTAL", "CKMB", "CKMBINDEX", "TROPONINI" in the last 168 hours. BNP: Invalid input(s): "POCBNP" CBG: No results for input(s): "GLUCAP" in the last 168 hours. D-Dimer No results for input(s): "DDIMER" in the last 72 hours. Hgb A1c No results for input(s): "HGBA1C" in the last 72 hours. Lipid Profile No results for  input(s): "CHOL", "HDL", "LDLCALC", "TRIG", "CHOLHDL", "LDLDIRECT" in the last 72 hours. Thyroid function studies No results for input(s): "TSH", "T4TOTAL", "T3FREE", "THYROIDAB" in the last 72 hours.  Invalid input(s): "FREET3" Anemia work up No results for input(s): "VITAMINB12", "FOLATE", "FERRITIN", "TIBC", "IRON", "RETICCTPCT" in the last 72 hours. Microbiology Recent Results (from the past 240 hour(s))  Resp panel by RT-PCR (RSV, Flu A&B, Covid) Anterior Nasal Swab     Status: None   Collection Time: 12/28/22  9:52 PM   Specimen: Anterior Nasal Swab  Result Value Ref Range Status   SARS Coronavirus 2 by RT PCR NEGATIVE NEGATIVE Final   Influenza A by PCR NEGATIVE NEGATIVE Final   Influenza B by PCR NEGATIVE NEGATIVE Final    Comment: (NOTE) The Xpert Xpress SARS-CoV-2/FLU/RSV plus assay is intended as an aid in the diagnosis of influenza from Nasopharyngeal swab specimens and should not be used as a sole basis for treatment. Nasal washings and aspirates are unacceptable for Xpert Xpress SARS-CoV-2/FLU/RSV testing.  Fact Sheet for Patients: BloggerCourse.com  Fact Sheet for Healthcare Providers: SeriousBroker.it  This test is not yet approved or cleared by the [place] FDA and has been authorized for detection and/or diagnosis of SARS-CoV-2 by FDA under an Emergency Use Authorization (EUA). This EUA will remain in effect (meaning this test can be used) for the duration of the COVID-19 declaration under Section 564(b)(1) of the Act, 21 U.S.C. section 360bbb-3(b)(1), unless the authorization is terminated or revoked.     Resp Syncytial Virus by PCR NEGATIVE NEGATIVE Final    Comment: (NOTE) Fact Sheet for Patients: BloggerCourse.com  Fact Sheet for Healthcare Providers: SeriousBroker.it  This test is not yet approved or cleared by the [place] FDA and has been  authorized for detection and/or diagnosis of SARS-CoV-2 by FDA under an Emergency Use Authorization (EUA). This EUA will remain in effect (meaning this test can be used) for the duration of the COVID-19 declaration under Section 564(b)(1) of the Act, 21 U.S.C. section 360bbb-3(b)(1), unless the authorization is terminated or revoked.  Performed at [facility], [address], [place], [place] [address]   Blood Culture (routine x 2)     Status: None (Preliminary result)   Collection Time: 12/28/22  9:52 PM   Specimen: BLOOD  Result Value Ref Range Status   Specimen Description BLOOD RIGHT HAND  Final   Special Requests   Final    BOTTLES DRAWN AEROBIC ONLY Blood Culture adequate volume   Culture   Final    NO GROWTH 4 DAYS Performed at [facility], [address], [place], [place] [address]    Report Status PENDING  Incomplete  Urine Culture     Status: Abnormal   Collection Time: 12/28/22  9:53 PM   Specimen: Urine, Random  Result Value Ref Range Status  Specimen Description URINE, RANDOM  Final   Special Requests   Final    NONE Reflexed from [id number] Performed at [facility], [address] [place], [place] [address]    Culture >=100,000 COLONIES/mL ESCHERICHIA COLI (A)  Final   Report Status 12/31/2022 FINAL  Final   Organism ID, Bacteria ESCHERICHIA COLI (A)  Final      Susceptibility   Escherichia coli - MIC*    AMPICILLIN <=2 SENSITIVE Sensitive     CEFAZOLIN <=4 SENSITIVE Sensitive     CEFEPIME <=0.12 SENSITIVE Sensitive     CEFTRIAXONE <=0.25 SENSITIVE Sensitive     CIPROFLOXACIN <=0.25 SENSITIVE Sensitive     GENTAMICIN <=1 SENSITIVE Sensitive     IMIPENEM <=0.25 SENSITIVE Sensitive     NITROFURANTOIN <=16 SENSITIVE Sensitive     TRIMETH/SULFA <=20 SENSITIVE Sensitive     AMPICILLIN/SULBACTAM <=2 SENSITIVE Sensitive     PIP/TAZO <=4 SENSITIVE Sensitive     * >=100,000 COLONIES/mL ESCHERICHIA COLI  Blood Culture (routine x 2)     Status:  None (Preliminary result)   Collection Time: 12/28/22 10:48 PM   Specimen: BLOOD  Result Value Ref Range Status   Specimen Description BLOOD LEFT ARM  Final   Special Requests   Final    BOTTLES DRAWN AEROBIC AND ANAEROBIC Blood Culture results may not be optimal due to an excessive volume of blood received in culture bottles   Culture   Final    NO GROWTH 4 DAYS Performed at [facility], [address], [place], [place] [address]    Report Status PENDING  Incomplete     Discharge Instructions:   Discharge Instructions     Ambulatory referral to Physical Therapy   Complete by: As directed    Iontophoresis - 4 mg/ml of dexamethasone: No   T.E.N.S. Unit Evaluation and Dispense as Indicated: No   Call MD for:  persistant nausea and vomiting   Complete by: As directed    Call MD for:  severe uncontrolled pain   Complete by: As directed    Call MD for:  temperature >100.4   Complete by: As directed    Diet general   Complete by: As directed    Discharge instructions   Complete by: As directed    Follow-up with your primary care provider in 1 week.  Check blood work at that time.  Continue antibiotics as prescribed.  Follow-up with your gynecologist as has been rescheduled.  Seek medical attention for worsening symptoms.   Increase activity slowly   Complete by: As directed       Allergies as of 01/01/2023   No Known Allergies      Medication List     TAKE these medications    acetaminophen 325 MG tablet Commonly known as: TYLENOL Take 2 tablets (650 mg total) by mouth every 6 (six) hours as needed for mild pain (or Fever >/= 101).   alum & mag hydroxide-simeth 200-200-20 MG/5ML suspension Commonly known as: MAALOX/MYLANTA Take 30 mLs by mouth every 4 (four) hours as needed for indigestion.   amoxicillin-clavulanate 875-125 MG tablet Commonly known as: AUGMENTIN Take 1 tablet by mouth 2 (two) times daily for 5 days.   HYDROmorphone 2 MG tablet Commonly  known as: DILAUDID Take 1 tablet (2 mg total) by mouth every 4 (four) hours as needed for severe pain.   metoCLOPramide 10 MG tablet Commonly known as: REGLAN Take 1 tablet (10 mg total) by mouth 4 (four)  times daily -  before meals and at bedtime. What changed:  when to take this reasons to take this   pantoprazole 40 MG tablet Commonly known as: PROTONIX Take 1 tablet (40 mg total) by mouth 2 (two) times daily.   polyethylene glycol 17 g packet Commonly known as: MIRALAX / GLYCOLAX Take 17 g by mouth daily as needed for mild constipation.   potassium chloride SA 20 MEQ tablet Commonly known as: KLOR-CON M Take 1 tablet (20 mEq total) by mouth daily.   senna 8.6 MG tablet Commonly known as: SENOKOT Take 1 tablet by mouth 2 (two) times daily.   simethicone 80 MG chewable tablet Commonly known as: MYLICON Chew 80 mg by mouth every 6 (six) hours as needed for flatulence.   sucralfate 1 g tablet Commonly known as: CARAFATE Take 1 tablet (1 g total) by mouth 4 (four) times daily -  with meals and at bedtime.               Durable Medical Equipment  (From admission, onward)           Start     Ordered   01/01/23 1059  For home use only DME Walker rolling  Once       Question Answer Comment  Walker: With 5 Inch Wheels   Patient needs a walker to treat with the following condition Generalized weakness      01/01/23 1059            Follow-up Information     Mort Sawyers, FNP Follow up in 1 week(s).   Specialty: Family Medicine Contact information: [address] [place] [address] [phone]         Hermina Staggers, MD Follow up in 1 week(s).   Specialty: Obstetrics and Gynecology Why: or early as scheduled by the clinic Contact information: [address] [place] [address] [phone]         Care, [facility] Follow up.   Why: [employer] will provide a Rolling Walker before you are discharged home  from the hospital. Contact information: [address] [place] [address] [phone]         [facility] at [facility]. Call.   Specialty: Rehabilitation Why: A referral was placed requesting Outpatient Therapy.  Please call the clinic to follow up. Contact information: [address] [place] [address] [phone]                 Time coordinating discharge: 39 minutes  Signed:  Rishik Tubby  [facility] 01/01/2023, 2:56 PM

## 2023-01-02 ENCOUNTER — Telehealth: Payer: Self-pay

## 2023-01-02 LAB — CULTURE, BLOOD (ROUTINE X 2)
Culture: NO GROWTH
Culture: NO GROWTH
Special Requests: ADEQUATE

## 2023-01-02 NOTE — Transitions of Care (Post Inpatient/ED Visit) (Unsigned)
01/02/2023  Name: Holly Brown MRN: [id number] DOB: 09-01-1985  Today's TOC FU Call Status: Today's TOC FU Call Status:: Unsuccessful Call (1st Attempt) Unsuccessful Call (1st Attempt) Date: 01/02/23  Attempted to reach the patient regarding the most recent Inpatient/ED visit.  Follow Up Plan: Additional outreach attempts will be made to reach the patient to complete the Transitions of Care (Post Inpatient/ED visit) call.   Signature Karena Addison, LPN [facility] Nurse Health Advisor Direct Dial [phone]

## 2023-01-06 NOTE — Transitions of Care (Post Inpatient/ED Visit) (Signed)
01/06/2023  Name: Holly Brown MRN: [id number] DOB: [birth year]  Today's TOC FU Call Status: Today's TOC FU Call Status:: Successful TOC FU Call Completed Unsuccessful Call (1st Attempt) Date: 01/02/23 [facility] FU Call Complete Date: 01/06/23 Patient's Name and Date of Birth confirmed.  Transition Care Management Follow-up Telephone Call Date of Discharge: 01/01/23 Discharge Facility: [facility]) Type of Discharge: Inpatient Admission Primary Inpatient Discharge Diagnosis:: UTI How have you been since you were released from the hospital?: Better Any questions or concerns?: No  Items Reviewed: Did you receive and understand the discharge instructions provided?: Yes Medications obtained,verified, and reconciled?: Yes (Medications Reviewed) Any new allergies since your discharge?: No Dietary orders reviewed?: Yes Do you have support at home?: Yes People in Home: parent(s)  Medications Reviewed Today: Medications Reviewed Today     Reviewed by Karena Addison, LPN (Licensed Practical Nurse) on 01/06/23 at 1605  Med List Status: <None>   Medication Order Taking? Sig Documenting Provider Last Dose Status Informant  acetaminophen (TYLENOL) 325 MG tablet [id number]  Take 2 tablets (650 mg total) by mouth every 6 (six) hours as needed for mild pain (or Fever >/= 101). Pokhrel, Laxman, MD  Active   alum & mag hydroxide-simeth (MAALOX/MYLANTA) 200-200-20 MG/5ML suspension [id number] No Take 30 mLs by mouth every 4 (four) hours as needed for indigestion. Lennart Pall, MD Past Week Active Self  amoxicillin-clavulanate (AUGMENTIN) 875-125 MG tablet [id number]  Take 1 tablet by mouth 2 (two) times daily for 5 days. Pokhrel, Rebekah Chesterfield, MD  Active   HYDROmorphone (DILAUDID) 2 MG tablet [id number] No Take 1 tablet (2 mg total) by mouth every 4 (four) hours as needed for severe pain. Hermina Staggers, MD 12/28/2022 Active Self  metoCLOPramide (REGLAN) 10 MG tablet [id number] No Take 1 tablet (10  mg total) by mouth 4 (four) times daily -  before meals and at bedtime.  Patient taking differently: Take 10 mg by mouth every 6 (six) hours as needed for vomiting or nausea.   Hermina Staggers, MD Past Week Active Self  pantoprazole (PROTONIX) 40 MG tablet [id number] No Take 1 tablet (40 mg total) by mouth 2 (two) times daily. Hermina Staggers, MD 12/28/2022 Active Self  polyethylene glycol (MIRALAX / GLYCOLAX) 17 g packet [id number]  Take 17 g by mouth daily as needed for mild constipation. Pokhrel, Laxman, MD  Active   potassium chloride SA (KLOR-CON M) 20 MEQ tablet [id number]  Take 1 tablet (20 mEq total) by mouth daily. Pokhrel, Laxman, MD  Active   senna (SENOKOT) 8.6 MG tablet [id number] No Take 1 tablet by mouth 2 (two) times daily. [provider] Past Week Active Self  simethicone (MYLICON) 80 MG chewable tablet [id number] No Chew 80 mg by mouth every 6 (six) hours as needed for flatulence. [provider] Past Week Active Self  sucralfate (CARAFATE) 1 g tablet [id number] No Take 1 tablet (1 g total) by mouth 4 (four) times daily -  with meals and at bedtime. Hermina Staggers, MD 12/28/2022 Active Self            Home Care and Equipment/Supplies: Were Home Health Services Ordered?: NA Any new equipment or medical supplies ordered?: Yes Name of Medical supply agency?: unknown Were you able to get the equipment/medical supplies?: Yes Do you have any questions related to the use of the equipment/supplies?: No  Functional Questionnaire: Do you need assistance with bathing/showering or dressing?: Yes Do you need assistance with meal preparation?: Yes Do you  need assistance with eating?: No Do you have difficulty maintaining continence: No Do you have difficulty managing or taking your medications?: No  Follow up appointments reviewed: PCP Follow-up appointment confirmed?: Yes Date of PCP follow-up appointment?: 01/08/23 Follow-up Provider: [facility]  Follow-up appointment confirmed?: Yes Date of Specialist follow-up appointment?: 01/07/23 Follow-Up Specialty Provider:: OBGYN Do you need transportation to your follow-up appointment?: No Do you understand care options if your condition(s) worsen?: Yes-patient verbalized understanding    SIGNATURE Karena Addison, LPN [facility] Nurse Health Advisor Direct Dial [phone]

## 2023-01-07 ENCOUNTER — Telehealth: Payer: Self-pay

## 2023-01-07 ENCOUNTER — Ambulatory Visit (INDEPENDENT_AMBULATORY_CARE_PROVIDER_SITE_OTHER): Payer: 59 | Admitting: Obstetrics and Gynecology

## 2023-01-07 ENCOUNTER — Encounter: Payer: Self-pay | Admitting: Obstetrics and Gynecology

## 2023-01-07 VITALS — BP 116/78 | HR 98 | Ht 70.0 in | Wt 216.0 lb

## 2023-01-07 DIAGNOSIS — Z9889 Other specified postprocedural states: Secondary | ICD-10-CM | POA: Insufficient documentation

## 2023-01-07 DIAGNOSIS — N9489 Other specified conditions associated with female genital organs and menstrual cycle: Secondary | ICD-10-CM

## 2023-01-07 DIAGNOSIS — B9689 Other specified bacterial agents as the cause of diseases classified elsewhere: Secondary | ICD-10-CM

## 2023-01-07 DIAGNOSIS — C539 Malignant neoplasm of cervix uteri, unspecified: Secondary | ICD-10-CM

## 2023-01-07 DIAGNOSIS — N760 Acute vaginitis: Secondary | ICD-10-CM

## 2023-01-07 NOTE — Telephone Encounter (Signed)
Spoke with the patient regarding the referral to GYN oncology. Patient scheduled as new patient with Dr Pricilla Holm on 01/16/2023. Patient given an arrival time of 10:00am.  Explained to the patient the the doctor will perform a pelvic exam at this visit. Patient given the policy that only one visitor allowed and that visitor must be over 16 yrs are allowed in the Cancer Center. Patient given the address/phone number for the clinic and that the center offers free valet service. Patient aware that masks are option.

## 2023-01-07 NOTE — Progress Notes (Signed)
37 y.o. GYN presents for Post Op FU.

## 2023-01-08 ENCOUNTER — Ambulatory Visit: Payer: 59 | Admitting: Family

## 2023-01-08 ENCOUNTER — Encounter: Payer: Self-pay | Admitting: Family

## 2023-01-08 ENCOUNTER — Ambulatory Visit (INDEPENDENT_AMBULATORY_CARE_PROVIDER_SITE_OTHER)
Admission: RE | Admit: 2023-01-08 | Discharge: 2023-01-08 | Disposition: A | Discharge status: Home / Self Care | Payer: 59 | Source: Ambulatory Visit | Attending: Family | Admitting: Family

## 2023-01-08 ENCOUNTER — Encounter (HOSPITAL_BASED_OUTPATIENT_CLINIC_OR_DEPARTMENT_OTHER): Payer: Self-pay | Admitting: Emergency Medicine

## 2023-01-08 ENCOUNTER — Telehealth: Payer: Self-pay | Admitting: Family

## 2023-01-08 ENCOUNTER — Other Ambulatory Visit (HOSPITAL_COMMUNITY): Payer: Self-pay

## 2023-01-08 ENCOUNTER — Emergency Department (HOSPITAL_BASED_OUTPATIENT_CLINIC_OR_DEPARTMENT_OTHER): Payer: 59

## 2023-01-08 ENCOUNTER — Emergency Department (HOSPITAL_BASED_OUTPATIENT_CLINIC_OR_DEPARTMENT_OTHER)
Admission: EM | Admit: 2023-01-08 | Discharge: 2023-01-08 | Disposition: A | Discharge status: Home / Self Care | Payer: 59 | Attending: Emergency Medicine | Admitting: Emergency Medicine

## 2023-01-08 ENCOUNTER — Telehealth: Payer: Self-pay

## 2023-01-08 ENCOUNTER — Other Ambulatory Visit: Payer: Self-pay

## 2023-01-08 VITALS — BP 108/68 | HR 78 | Temp 97.5°F | Ht 71.0 in | Wt 223.8 lb

## 2023-01-08 DIAGNOSIS — R1030 Lower abdominal pain, unspecified: Secondary | ICD-10-CM | POA: Diagnosis not present

## 2023-01-08 DIAGNOSIS — R079 Chest pain, unspecified: Secondary | ICD-10-CM | POA: Diagnosis not present

## 2023-01-08 DIAGNOSIS — R7989 Other specified abnormal findings of blood chemistry: Secondary | ICD-10-CM | POA: Diagnosis not present

## 2023-01-08 DIAGNOSIS — R0609 Other forms of dyspnea: Secondary | ICD-10-CM | POA: Diagnosis not present

## 2023-01-08 DIAGNOSIS — R790 Abnormal level of blood mineral: Secondary | ICD-10-CM

## 2023-01-08 DIAGNOSIS — I3139 Other pericardial effusion (noninflammatory): Secondary | ICD-10-CM | POA: Diagnosis not present

## 2023-01-08 DIAGNOSIS — Z90710 Acquired absence of both cervix and uterus: Secondary | ICD-10-CM

## 2023-01-08 DIAGNOSIS — D72829 Elevated white blood cell count, unspecified: Secondary | ICD-10-CM

## 2023-01-08 DIAGNOSIS — E876 Hypokalemia: Secondary | ICD-10-CM | POA: Diagnosis not present

## 2023-01-08 DIAGNOSIS — R5381 Other malaise: Secondary | ICD-10-CM | POA: Insufficient documentation

## 2023-01-08 DIAGNOSIS — R9389 Abnormal findings on diagnostic imaging of other specified body structures: Secondary | ICD-10-CM

## 2023-01-08 DIAGNOSIS — R29898 Other symptoms and signs involving the musculoskeletal system: Secondary | ICD-10-CM | POA: Insufficient documentation

## 2023-01-08 DIAGNOSIS — R0602 Shortness of breath: Secondary | ICD-10-CM | POA: Insufficient documentation

## 2023-01-08 DIAGNOSIS — C539 Malignant neoplasm of cervix uteri, unspecified: Secondary | ICD-10-CM

## 2023-01-08 LAB — CBC
HCT: 34.6 % — ABNORMAL LOW (ref 36.0–46.0)
Hemoglobin: 11.3 g/dL — ABNORMAL LOW (ref 12.0–15.0)
MCHC: 32.7 g/dL (ref 30.0–36.0)
MCV: 91.7 fl (ref 78.0–100.0)
Platelets: 820 10*3/uL — ABNORMAL HIGH (ref 150.0–400.0)
RBC: 3.78 Mil/uL — ABNORMAL LOW (ref 3.87–5.11)
RDW: 14.7 % (ref 11.5–15.5)
WBC: 11 10*3/uL — ABNORMAL HIGH (ref 4.0–10.5)

## 2023-01-08 LAB — BASIC METABOLIC PANEL
BUN: 15 mg/dL (ref 6–23)
CO2: 25 mEq/L (ref 19–32)
Calcium: 9.9 mg/dL (ref 8.4–10.5)
Chloride: 104 mEq/L (ref 96–112)
Creatinine, Ser: 0.9 mg/dL (ref 0.40–1.20)
GFR: 83.1 mL/min (ref >60.00)
Glucose, Bld: 85 mg/dL (ref 70–99)
Potassium: 4.4 mEq/L (ref 3.5–5.1)
Sodium: 140 mEq/L (ref 135–145)

## 2023-01-08 LAB — MAGNESIUM: Magnesium: 1.9 mg/dL (ref 1.5–2.5)

## 2023-01-08 LAB — D-DIMER, QUANTITATIVE: D-Dimer, Quant: 2.01 mcg/mL FEU — ABNORMAL HIGH (ref <0.50)

## 2023-01-08 MED ORDER — METRONIDAZOLE 0.75 % VA GEL
1.0000 | Freq: Every day | VAGINAL | 1 refills | Status: DC
Start: 2023-01-08 — End: 2023-01-28
  Filled 2023-01-08: qty 70, 5d supply, fill #0

## 2023-01-08 MED ORDER — IOHEXOL 350 MG/ML SOLN
100.0000 mL | Freq: Once | INTRAVENOUS | Status: AC | PRN
  Administered 2023-01-08: 80 mL via INTRAVENOUS

## 2023-01-08 MED ORDER — POTASSIUM CHLORIDE CRYS ER 20 MEQ PO TBCR: 40.0000 meq | EXTENDED_RELEASE_TABLET | Freq: Once | ORAL | Status: DC

## 2023-01-08 NOTE — Progress Notes (Signed)
Established Patient Hospital follow up Visit Subjective:   Patient ID: [name], female    DOB: 21-Mar-1986  Age: 37 y.o. MRN: [id number]  CC:  Chief Complaint  Patient presents with   Hospitalization Follow-up     HPI  EMANDA SOO is a 37 y.o. female presenting on 01/08/2023 for  hospital follow up.    Pt here today for hospital f/u   8/30 went to ER [facility] 8/31 , admitted for observation discharged 9/2   She had underwent hysterectomy for symptomatic fibroids and menorrhagia.  8/29 and had heavy bleeding after words. N/V ws not controlled and she had received rounds of medication to help control after surgery, but once she went home she had intractable vomiting with severe abdominal pain and was without bowel movement since leaving the hospital. Given fentanyl and zofran and 1 L fluid bolus for dehydration. Cbc leukocytes 13.4 cmp hypokalemia 3.4 elevation creatinine 1.13. UA ok. Ct abd pelvis moderate free fluid within pelvis (post op blood products. Gynecology was consulted and was admitted.   9/2: presented to admissions as she started vomiting agaain with severe lower abd pain. CT scan no acute findings. Dilaudid oxycodone toradol given but no pain relief. BP elevated at time, labetalol x 2 IV doses given. Levsin and simethicone also giving. Gi was consulted, had EGD with gastric ulcer. Started on protonics and carafate. Reglan for her N/V , discharged.  Admitted 9/2 discharged 9/6  9/8 went back to [facility], admitted 9/9 discharged 9/12. Advised for repeat cbc bmp mag at f/u pcp wbc were 20,800 platelets 561,000 normal lactic acid. CT fluid collection near vaginal cuff. U/a positive for UTI, seen by GYN in ED, given ivf and admitted.antbx were given as well, 7 day course of augmentin on discharged. IV rocephin and flagyl inpatient. Decrease hemoglobin from 11, down at 9.1 , PUD advised to continue ppi and carafate. Declined potassium 3.1 40 meq potassium inpt and  20 meq daily for 10 days post discharge. Debility weakness, recommendation for PT outpatient. She was given an RX for rolling walker for weakness, and has this at home. She states   Post op GYN seen yesterday by him, Dr. Alysia Penna. Note not finished in epic just yet but pt states that vaginal suppository given yesterday for ongoing discharge, which gyn states is normal to be expected post op.   Has not yet made a f/u with GI but plans to.   She states has not yet been called to set up physical therapy.   Biopsy from surgery had shown cervical cancer. She has been referred to hemoc and she has upcoming appointment next Friday with Dr. Pricilla Holm.   Acute concerns:   She states ongoing abdominal pain in the lower abdomen and meets in the middle and then eases and goes away. This goes on throughout the whole day.she takes tylenol throughout the day with some relief, not as severe when taking. Yesterday was the first day in two weeks she had a bowel movement. She is still taking daily miralax and sennakot. She does have to catch her breath when she is overexerting her energy, takes a lot out of her to take a shower as well. She has a shower chair which is helping her with exertion. She also feels this as well when she is trying to change herself. When walking will get sob but states only when doing a lot of walking, and longer distances. She is using incentive spirometry daily  as well.   She has not vomited since discharge.   Cxr 9/8: indeterminate metallic density 1.3 cm overlying expected region of distal esophagus, recommendation was for repeat PA lateral view of chest. Denies any esophageal and or neck surgeries in the past. No pacer or port in place.  Pt denies any pain swallowing. Pt states was not advised of this finding, so she is unsure what became of it.     No Known Allergies ----------------   Social history:  Relevant past medical, surgical, family and social history reviewed and updated  as indicated. Interim medical history since our last visit reviewed.  Allergies and medications reviewed and updated.  DATA REVIEWED: CHART IN EPIC    ROS: Negative unless specifically indicated above in HPI.    Current Outpatient Medications:    metroNIDAZOLE (METROGEL) 0.75 % vaginal gel, Place 1 applicatorful vaginally at bedtime for 5 days, Disp: 70 g, Rfl: 1      Objective:    BP 108/68 (BP Location: Right Arm, Patient Position: Sitting, Cuff Size: Large)   Pulse 78   Temp (!) 97.5 F (36.4 C) (Temporal)   Ht 5\' 11"  (1.803 m)   Wt 223 lb 12.8 oz (101.5 kg)   LMP  (LMP Unknown) Comment: Depo Injections - last injection 10/2022, had spotting on and off since 10/2022 injection  SpO2 99%   BMI 31.21 kg/m   Wt Readings from Last 3 Encounters:  01/08/23 223 lb 12.8 oz (101.5 kg)  01/07/23 216 lb (98 kg)  01/01/23 215 lb 13.3 oz (97.9 kg)    Physical Exam Vitals reviewed.  Constitutional:      General: She is not in acute distress.    Appearance: Normal appearance. She is normal weight. She is not ill-appearing, toxic-appearing or diaphoretic.  HENT:     Head: Normocephalic.  Cardiovascular:     Rate and Rhythm: Normal rate and regular rhythm.  Pulmonary:     Effort: Pulmonary effort is normal.     Breath sounds: Normal breath sounds.  Musculoskeletal:        General: Normal range of motion.     Right lower leg: No edema.     Left lower leg: No edema.  Neurological:     General: No focal deficit present.     Mental Status: She is alert and oriented to person, place, and time. Mental status is at baseline.  Psychiatric:        Mood and Affect: Mood normal.        Behavior: Behavior normal.        Thought Content: Thought content normal.        Judgment: Judgment normal.         Assessment & Plan:  Hypokalemia -     Basic metabolic panel  Leukocytosis, unspecified type -     CBC  Low magnesium level -     Magnesium  Physical debility Assessment &  Plan: Worsening  Sent for PT evaluation   Orders: -     Ambulatory referral to Physical Therapy  Weakness of both lower extremities Assessment & Plan: Limiting IADLS  Referral placed for physical therapy  Pt advised to work on strength exercises when able.  Use incentive spirometry as well to try to avoid pneumonia development from decreased mobility   Orders: -     Ambulatory referral to Physical Therapy  Lower abdominal pain -     POCT Urinalysis Dipstick (Automated) -     Urinalysis  w microscopic + reflex cultur; Future  DOE (dyspnea on exertion) Assessment & Plan: CXR today  R/o PE and or effusion.  R/o pneumonia process.  Ordered stat.  Informed pt of red flag concerns and when to seek more urgent care   Orders: -     D-dimer, quantitative -     DG Chest 2 View; Future  Abnormal chest x-ray Assessment & Plan: Noted on hospital review.  Repeat CXR pending results.  Pt denies any known surgeries or metal placement   Orders: -     DG Chest 2 View; Future  Malignant neoplasm of cervix, unspecified site [facility]) Assessment & Plan: Pt with upcoming appt s/p hysterectomy with hematology for further evaluation to r/o metastasis   S/P hysterectomy Assessment & Plan: Maintain f/u with obgyn as scheduled. Reviewed note from 9/19      Return in about 3 months (around 04/09/2023) for f/u n/v lower abdominal pain .  Mort Sawyers, FNP

## 2023-01-08 NOTE — Assessment & Plan Note (Signed)
Limiting IADLS  Referral placed for physical therapy  Pt advised to work on strength exercises when able.  Use incentive spirometry as well to try to avoid pneumonia development from decreased mobility

## 2023-01-08 NOTE — Assessment & Plan Note (Addendum)
Noted on hospital review.  Repeat CXR pending results.  Pt denies any known surgeries or metal placement

## 2023-01-08 NOTE — Assessment & Plan Note (Signed)
Worsening  Sent for PT evaluation

## 2023-01-08 NOTE — Progress Notes (Signed)
Pulse oximetry checked while ambulating.  Patient did a lap around the [facility] emergency department.  Her SpO2 started out at 100% when in her room and only dipped to 94% right before we got back to her room.  Her heart rate remained between 103-105 bpm and her respiratory rate remained at 19-20 for the duration of the walk.

## 2023-01-08 NOTE — Discharge Instructions (Signed)
It was a pleasure caring for you today.  As discussed, your platelets were elevated today.  Please follow-up with primary care provider about this.  Seek emergency care if experiencing any new or worsening symptoms.

## 2023-01-08 NOTE — Assessment & Plan Note (Signed)
Maintain f/u with obgyn as scheduled. Reviewed note from 9/19

## 2023-01-08 NOTE — ED Notes (Signed)
2 x unsuccessful IV attempts, Pt tolerated well. Will have RN with [place] skills attempt

## 2023-01-08 NOTE — Patient Instructions (Signed)
  Call GI to get scheduled.    Regards,   Mort Sawyers FNP-C

## 2023-01-08 NOTE — Progress Notes (Signed)
Called and spoke to pt for elevation plt and elevated  D dimer with DOE.  Advised to call 911. High suspicion for blood clot.

## 2023-01-08 NOTE — Telephone Encounter (Signed)
Spoke with pt. Advised her that the order that Tabitha placed this morning was for [facility] and that she could call them to go ahead to set up an appointment. Pt verbalized understanding. Nothing further was needed.

## 2023-01-08 NOTE — ED Triage Notes (Signed)
Pt has hysterectomy on 8/27 and has had a difficult post op course with incision  pain and vaginal drainage and feeling pain.  Pt had follow up visit today and was called later to come to the ED for a D Dimer of 2.0

## 2023-01-08 NOTE — ED Provider Notes (Signed)
[place] EMERGENCY DEPARTMENT AT [facility] Provider Note   CSN: [id number] Arrival date & time: 01/08/23  1617     History  Chief Complaint  Patient presents with   Abnormal Labs    Holly Brown is a 37 y.o. female with PMHx migraines, seizures, PVD, s/p hysterectomy 12/16/22 who presents to ED referred by PCP for elevated d-dimer. Patient has been undergoing many complications after hysterectomy, including DOE. Patient also with lower abdominal pain after surgery that she is following closely with PCP and surgeons. PCP ordered d-dimer to check for PE given patient's recent DOE. Patient also had other labs obtained today at her office visit.   Of note, patient recently discharged from inpatient admission for intractable pelvic pain, 7.2x7.6cm fluid collection, and leukocytosis. Patient stating that she feels fine today and was actually surprised when PCP told her to go to ED.  Patient denying fever, chest pain, cough, nausea, vomiting, diarrhea.    HPI     Home Medications Prior to Admission medications   Medication Sig Start Date End Date Taking? Authorizing Provider  metroNIDAZOLE (METROGEL) 0.75 % vaginal gel Place 1 applicatorful vaginally at bedtime for 5 days 01/08/23   Hermina Staggers, MD      Allergies    Patient has no known allergies.    Review of Systems   Review of Systems  Respiratory:  Positive for shortness of breath.   Gastrointestinal:  Positive for abdominal pain.    Physical Exam Updated Vital Signs BP 119/81   Pulse 84   Temp 98.2 F (36.8 C) (Oral)   Resp 18   LMP  (LMP Unknown) Comment: Depo Injections - last injection 10/2022, had spotting on and off since 10/2022 injection  SpO2 100%  Physical Exam Vitals and nursing note reviewed.  Constitutional:      General: She is not in acute distress.    Appearance: She is not ill-appearing or toxic-appearing.  HENT:     Head: Normocephalic and atraumatic.     Mouth/Throat:      Mouth: Mucous membranes are moist.  Eyes:     General: No scleral icterus.       Right eye: No discharge.        Left eye: No discharge.     Conjunctiva/sclera: Conjunctivae normal.  Cardiovascular:     Rate and Rhythm: Normal rate and regular rhythm.     Pulses: Normal pulses.     Heart sounds: Normal heart sounds. No murmur heard. Pulmonary:     Effort: Pulmonary effort is normal. No respiratory distress.     Breath sounds: Normal breath sounds. No wheezing, rhonchi or rales.  Abdominal:     General: Abdomen is flat. Bowel sounds are normal.     Palpations: Abdomen is soft.     Comments: Lower abdominal tenderness to palpation  Musculoskeletal:     Right lower leg: No edema.     Left lower leg: No edema.     Comments: No calf tenderness to palpation. No pitting edema.   Skin:    General: Skin is warm and dry.     Findings: No rash.  Neurological:     General: No focal deficit present.     Mental Status: She is alert. Mental status is at baseline.  Psychiatric:        Mood and Affect: Mood normal.        Behavior: Behavior normal.     ED Results / Procedures / Treatments  Labs (all labs ordered are listed, but only abnormal results are displayed) Labs Reviewed - No data to display  EKG EKG Interpretation Date/Time:  Thursday January 08 2023 21:09:39 EDT Ventricular Rate:  84 PR Interval:  159 QRS Duration:  113 QT Interval:  368 QTC Calculation: 435 R Axis:   76  Text Interpretation: Sinus rhythm Artifact in lead(s) I III aVR aVL aVF V1 V3 V4 V5 V6 Confirmed by Vonita Moss [phone]) on 01/08/2023 10:41:34 PM  Radiology CT Angio Chest PE W/Cm &/Or Wo Cm  Result Date: 01/08/2023 CLINICAL DATA:  Chest pain and shortness of breath EXAM: CT ANGIOGRAPHY CHEST WITH CONTRAST TECHNIQUE: Multidetector CT imaging of the chest was performed using the standard protocol during bolus administration of intravenous contrast. Multiplanar CT image reconstructions and MIPs were  obtained to evaluate the vascular anatomy. RADIATION DOSE REDUCTION: This exam was performed according to the departmental dose-optimization program which includes automated exposure control, adjustment of the mA and/or kV according to patient size and/or use of iterative reconstruction technique. CONTRAST:  80mL OMNIPAQUE IOHEXOL 350 MG/ML SOLN COMPARISON:  None Available. FINDINGS: Cardiovascular: No evidence of central pulmonary embolus. Evaluation of the segmental and subsegmental pulmonary arteries is limited due to bolus timing. Normal heart size. Pericardial effusion. Normal caliber thoracic aorta with no atherosclerotic disease. No coronary artery calcifications. Mediastinum/Nodes: Esophagus and thyroid are unremarkable. No enlarged lymph nodes seen in the chest. Mild soft tissue of the anterior mediastinum which is consistent with residual thymus. Lungs/Pleura: Central airways are patent. No consolidation, pleural effusion or pneumothorax. Upper Abdomen: No acute abnormality. Musculoskeletal: No chest wall abnormality. No acute or significant osseous findings. Review of the MIP images confirms the above findings. IMPRESSION: 1. No evidence of central pulmonary embolus. Evaluation of the segmental and subsegmental pulmonary arteries is limited due to bolus timing. 2. No acute findings in the chest. Electronically Signed   By: Allegra Lai M.D.   On: 01/08/2023 19:32   DG Chest 2 View  Result Date: 01/08/2023 CLINICAL DATA:  Dyspnea on exertion. EXAM: CHEST - 2 VIEW COMPARISON:  AP chest 12/28/2022 and 05/18/2013 FINDINGS: Cardiac silhouette and mediastinal contours are within normal limits. The lungs are clear. No pleural effusion or pneumothorax. No acute skeletal abnormality. The previously seen metallic density overlying the distal esophagus and lower thoracic spine on prior frontal chest radiograph 12/28/2022 is no longer visualized. IMPRESSION: 1. No active cardiopulmonary disease. 2. The  previously seen metallic density overlying the distal esophagus on prior frontal chest radiograph 12/28/2022 is no longer visualized. It is unclear whether this previously was internal or external to the patient. Electronically Signed   By: Neita Garnet M.D.   On: 01/08/2023 12:03    Procedures Procedures    Medications Ordered in ED Medications  iohexol (OMNIPAQUE) 350 MG/ML injection 100 mL (80 mLs Intravenous Contrast Given 01/08/23 1825)    ED Course/ Medical Decision Making/ A&P                                 Medical Decision Making Amount and/or Complexity of Data Reviewed Radiology: ordered.  Risk Prescription drug management.   This patient presents to the ED for concern of shortness of breath, this involves an extensive number of treatment options, and is a complaint that carries with it a high risk of complications and morbidity.  The differential diagnosis includes Anxiety, Anaphylaxis/Angioedema, Aspirated FB, Arrhythmia, CHF, Asthma, COPD, PNA, COVID/Flu/RSV,  STEMI, Tamponade, TPNX, Sepsis   Co morbidities that complicate the patient evaluation  migraines, seizures, PVD, s/p hysterectomy 8/27    Additional history obtained:  Patient's Labs obtained today by PCP -CBC: mild leukocytosis at 11.0; mild anemia with hbg 11.3; PLT elevated at 820 -BMP: no concern for electrolyte abnormality; no concern for kidney damage -Mag: within normal limits -d-dimer: 2.01    Imaging Studies ordered:  I ordered imaging studies including  -CTA chest: to assess for process contributing to patient's symptoms  I independently visualized and interpreted imaging  I agree with the radiologist interpretation    Problem List / ED Course / Critical interventions / Medication management  Patient referred by PCP to ED concern for elevated D-dimer.  Per PCP note, patient has been having DOE ever since her hysterectomy 12/16/2022.  D-dimer was elevated at 2.01 today.  Patient stating  that she is actually been feeling fine recently and was surprised to be referred to the emergency room.  Of note, patient has been with lower abdominal pain and DOE since her hysterectomy and is following closely with PCP and surgery.  Physical exam unremarkable.  Patient afebrile with stable vitals.  Patient ambulated with O2 >94% on RA. CTA chest without concerns for PE. EKG reassuring.  Labs today with mild leukocytosis at 11 which is actually improved from past values.  Patient also with mild anemia with hemoglobin at 11.3.  BMP and mag within normal limits.  Of note, patient's platelets elevated today at 820.  Patient's platelets have been elevated chronically and she is following with PCP for this. Patient stating that she feels fine and would like to go home.  Recommended patient follow-up with PCP.  Patient verbalized understanding of plan. Patient stating that she has been taking dilaudid for her abdominal pain since the surgery. I educated patient on the risks of taking too much narcotics to include respiratory distress. Patient verbalized understanding and will follow up with PCP. Staffed patient with Dr. Eloise Harman. I have reviewed the patients home medicines and have made adjustments as needed Patient was given return precautions. Patient stable for discharge at this time. Patient verbalized understanding of plan.  DDx: These are considered less likely due to history of present illness and physical exam findings -Aspirated FB: no history of choking -Arrhythmia/STEMI: EKG without concern -Asthma/COPD/PNA/COVID/Flu/RSV: Lungs clear to auscultation bilaterally and O2 sat 100% -Tamponade: CT without concern -CHF: no physical exam findings -TPNX: Lungs clear to auscultation bilaterally -Sepsis: afebrile and other vital signs stable    Social Determinants of Health:  none          Final Clinical Impression(s) / ED Diagnoses Final diagnoses:  Positive D dimer  DOE (dyspnea on  exertion)  Lower abdominal pain    Rx / DC Orders ED Discharge Orders     None         Margarita Rana 01/09/23 0007    Rondel Baton, MD 01/09/23 1214

## 2023-01-08 NOTE — Assessment & Plan Note (Signed)
Pt with upcoming appt s/p hysterectomy with hematology for further evaluation to r/o metastasis

## 2023-01-08 NOTE — ED Notes (Signed)
Unsuccessful attempt at IV insertion to right AC. Site WNL.

## 2023-01-08 NOTE — Assessment & Plan Note (Signed)
CXR today  R/o PE and or effusion.  R/o pneumonia process.  Ordered stat.  Informed pt of red flag concerns and when to seek more urgent care

## 2023-01-08 NOTE — Telephone Encounter (Signed)
Pt called stating she wanted her rehab referral to go to [place], not [facility]. Call back # [phone]

## 2023-01-08 NOTE — Telephone Encounter (Signed)
[facility] with [facility] lab called critical lab result for high D Dimer 2.01 report is in Epic. Sending note to T dugal FNP and will call tabitha who is out of office this afternoon.

## 2023-01-09 NOTE — Progress Notes (Signed)
Ms Lefever presents for post op visit S/P [facility] on 12/16/22  Post op course has been complicated by several readmissions for pain, N/V, vaginal bleeding and UTI sepsis.  During these admissions Dx with gastric ulcer ( has F/U with GI) ( not taking meds), pelvic hematoma ( resolving on latest CT scan) and UTI ( S/P Tx)  Overall she reports doing better. Still has an occ pain but does not require pain medication Had BM for the first time in 2 weeks yesterday. Taking Miralax daily  Voiding without problems. Denies N/V  Tolerating diet but appetite has not returned to normal  She has noted some problems with dizziness and unsteady gait. Was offered PT with latest hospitalization but declined. She is using a walker to help with her gait and reports this is improving since latest hospital discharge  Pathology for [facility] returned as Stage 1 cervical Cancer. Dx was reviewed with pt during one of her hospitalizations. She has an appt with Dr. Dorian Pod GYN Onc next week  She denies vaginal bleeding. She does report a vaginal discharge with some odor  PE AF VSS Chaperone present  Lungs clear Heart RRR Abd soft + BS GU Nl EGBUS, cuff healing well, no bleeding noted, sutures noted, scant discharge noted. Bimanual uterus surgerical absent, bladder non tender, no adnexal tenderness  A/P S/P TVH         Stage 1 Cervical CA         Pelvic hematoma  Pt reassured in regards to the vaginal discharge. However will send in Rx for Metrogel. Encourage to continue with every day Miralax until back to normal BM habits. F/U with GI and PCP and GYN ONC as recommended. F/U PRN

## 2023-01-09 NOTE — Telephone Encounter (Signed)
Duplicate message.  Notified pt and she called 911 as she did not have transportation, on 9/19, did go to ER 9/19 and was discharged same day with negative CTA.

## 2023-01-09 NOTE — ED Provider Notes (Incomplete)
[place] EMERGENCY DEPARTMENT AT [facility] Provider Note   CSN: [id number] Arrival date & time: 01/08/23  1617     History {Add pertinent medical, surgical, social history, OB history to HPI:1} Chief Complaint  Patient presents with  . Abnormal Labs    Holly Brown is a 37 y.o. female with PMHx migraines, seizures, PVD, s/p hysterectomy 12/16/22 who presents to ED referred by PCP for elevated d-dimer. Patient has been undergoing many complications after hysterectomy, including DOE. Patient also with lower abdominal pain after surgery that she is following closely with PCP and surgeons. PCP ordered d-dimer to check for PE given patient's recent DOE. Patient also had other labs obtained today at her office visit.   Of note, patient recently discharged from inpatient admission for intractable pelvic pain, 7.2x7.6cm fluid collection, and leukocytosis. Patient stating that she feels fine today and was actually surprised when PCP told her to go to ED.  Patient denying fever, chest pain, cough, nausea, vomiting, diarrhea.    HPI     Home Medications Prior to Admission medications   Medication Sig Start Date End Date Taking? Authorizing Provider  metroNIDAZOLE (METROGEL) 0.75 % vaginal gel Place 1 applicatorful vaginally at bedtime for 5 days 01/08/23   Hermina Staggers, MD      Allergies    Patient has no known allergies.    Review of Systems   Review of Systems  Respiratory:  Positive for shortness of breath.   Gastrointestinal:  Positive for abdominal pain.    Physical Exam Updated Vital Signs BP 115/61 (BP Location: Right Arm)   Pulse 92   Temp 98.2 F (36.8 C)   Resp 17   LMP  (LMP Unknown) Comment: Depo Injections - last injection 10/2022, had spotting on and off since 10/2022 injection  SpO2 98%  Physical Exam Vitals and nursing note reviewed.  Constitutional:      General: She is not in acute distress.    Appearance: She is not ill-appearing or  toxic-appearing.  HENT:     Head: Normocephalic and atraumatic.     Mouth/Throat:     Mouth: Mucous membranes are moist.  Eyes:     General: No scleral icterus.       Right eye: No discharge.        Left eye: No discharge.     Conjunctiva/sclera: Conjunctivae normal.  Cardiovascular:     Rate and Rhythm: Normal rate and regular rhythm.     Pulses: Normal pulses.     Heart sounds: Normal heart sounds. No murmur heard. Pulmonary:     Effort: Pulmonary effort is normal. No respiratory distress.     Breath sounds: Normal breath sounds. No wheezing, rhonchi or rales.  Abdominal:     General: Abdomen is flat. Bowel sounds are normal.     Palpations: Abdomen is soft.     Comments: Lower abdominal tenderness to palpation  Musculoskeletal:     Right lower leg: No edema.     Left lower leg: No edema.     Comments: No calf tenderness to palpation. No pitting edema.   Skin:    General: Skin is warm and dry.     Findings: No rash.  Neurological:     General: No focal deficit present.     Mental Status: She is alert. Mental status is at baseline.  Psychiatric:        Mood and Affect: Mood normal.        Behavior: Behavior  normal.     ED Results / Procedures / Treatments   Labs (all labs ordered are listed, but only abnormal results are displayed) Labs Reviewed - No data to display  EKG None  Radiology DG Chest 2 View  Result Date: 01/08/2023 CLINICAL DATA:  Dyspnea on exertion. EXAM: CHEST - 2 VIEW COMPARISON:  AP chest 12/28/2022 and 05/18/2013 FINDINGS: Cardiac silhouette and mediastinal contours are within normal limits. The lungs are clear. No pleural effusion or pneumothorax. No acute skeletal abnormality. The previously seen metallic density overlying the distal esophagus and lower thoracic spine on prior frontal chest radiograph 12/28/2022 is no longer visualized. IMPRESSION: 1. No active cardiopulmonary disease. 2. The previously seen metallic density overlying the distal  esophagus on prior frontal chest radiograph 12/28/2022 is no longer visualized. It is unclear whether this previously was internal or external to the patient. Electronically Signed   By: Neita Garnet M.D.   On: 01/08/2023 12:03    Procedures Procedures  {Document cardiac monitor, telemetry assessment procedure when appropriate:1}  Medications Ordered in ED Medications - No data to display  ED Course/ Medical Decision Making/ A&P   {   Click here for ABCD2, HEART and other calculatorsREFRESH Note before signing :1}                              Medical Decision Making Amount and/or Complexity of Data Reviewed Radiology: ordered.  Risk Prescription drug management.   This patient presents to the ED for concern of shortness of breath, this involves an extensive number of treatment options, and is a complaint that carries with it a high risk of complications and morbidity.  The differential diagnosis includes Anxiety, Anaphylaxis/Angioedema, Aspirated FB, Arrhythmia, CHF, Asthma, COPD, PNA, COVID/Flu/RSV, STEMI, Tamponade, TPNX, Sepsis   Co morbidities that complicate the patient evaluation  migraines, seizures, PVD, s/p hysterectomy 8/27    Additional history obtained:  Patient's Labs obtained today by PCP -CBC: mild leukocytosis at 11.0; mild anemia with hbg 11.3; PLT elevated at 820 -BMP: no concern for electrolyte abnormality; no concern for kidney damage -Mag: within normal limits -d-dimer: 2.01    Imaging Studies ordered:  I ordered imaging studies including  -CTA chest: to assess for process contributing to patient's symptoms  I independently visualized and interpreted imaging  I agree with the radiologist interpretation    Problem List / ED Course / Critical interventions / Medication management  Patient referred by PCP to ED concern for elevated D-dimer.  Per PCP note, patient has been having DOE ever since her hysterectomy 12/16/2022.  D-dimer was elevated at  2.01 today.  Patient stating that she is actually been feeling fine recently and was surprised to be referred to the emergency room.  Of note, patient has been with lower abdominal pain and DOE since her hysterectomy and is following closely with PCP and surgery.  Physical exam unremarkable.  Patient afebrile with stable vitals.  Patient ambulated with O2 >94% on RA. CTA chest without concerns for PE. EKG reassuring.  Labs today with mild leukocytosis at 11 which is actually improved from past values.  Patient also with mild anemia with hemoglobin at 11.3.  BMP and mag within normal limits.  Of note, patient's platelets elevated today at 820.  Patient's platelets have been elevated chronically and she is following with PCP for this. Patient stating that she feels fine and would like to go home.  Recommended patient follow-up with  PCP.  Patient verbalized understanding of plan. Staffed patient with Dr. Eloise Harman. I have reviewed the patients home medicines and have made adjustments as needed Patient was given return precautions. Patient stable for discharge at this time. Patient verbalized understanding of plan.  DDx: These are considered less likely due to history of present illness and physical exam findings -Aspirated FB: no history of choking -Arrhythmia/STEMI: EKG without concern -Asthma/COPD/PNA/COVID/Flu/RSV: Lungs clear to auscultation bilaterally and O2 sat 100% -Tamponade: CT without concern -CHF: no physical exam findings -TPNX: Lungs clear to auscultation bilaterally -Sepsis: afebrile and other vital signs stable    Social Determinants of Health:  none    {Document critical care time when appropriate:1} {Document review of labs and clinical decision tools ie heart score, Chads2Vasc2 etc:1}  {Document your independent review of radiology images, and any outside records:1} {Document your discussion with family members, caretakers, and with consultants:1} {Document social  determinants of health affecting pt's care:1} {Document your decision making why or why not admission, treatments were needed:1} Final Clinical Impression(s) / ED Diagnoses Final diagnoses:  None    Rx / DC Orders ED Discharge Orders     None

## 2023-01-10 LAB — URINALYSIS W MICROSCOPIC + REFLEX CULTURE
Bacteria, UA: NONE SEEN /HPF
Bilirubin Urine: NEGATIVE
Glucose, UA: NEGATIVE
Hgb urine dipstick: NEGATIVE
Hyaline Cast: NONE SEEN /LPF
Ketones, ur: NEGATIVE
Nitrites, Initial: NEGATIVE
RBC / HPF: NONE SEEN /HPF (ref 0–2)
Specific Gravity, Urine: 1.03 (ref 1.001–1.035)
pH: 6 (ref 5.0–8.0)

## 2023-01-10 LAB — CULTURE INDICATED

## 2023-01-10 LAB — URINE CULTURE
MICRO NUMBER:: 15493624
Result:: NO GROWTH
SPECIMEN QUALITY:: ADEQUATE

## 2023-01-13 ENCOUNTER — Telehealth: Payer: Self-pay

## 2023-01-13 ENCOUNTER — Encounter: Payer: Self-pay | Admitting: Gynecologic Oncology

## 2023-01-13 NOTE — Progress Notes (Unsigned)
Subjective:    Patient ID: Holly Brown, female    DOB: Oct 20, 1985, 37 y.o.   MRN: [id number]  HPI  Wt Readings from Last 3 Encounters:  01/14/23 215 lb 6 oz (97.7 kg)  01/08/23 223 lb 12.8 oz (101.5 kg)  01/07/23 216 lb (98 kg)   30.04 kg/m  Vitals:   01/14/23 0955  BP: 106/70  Pulse: 96  Temp: 97.8 F (36.6 C)  SpO2: 100%    37 yo pt of NP Dugal presents with abdominal pain  She has a history of recent hysterectomy (with path noting cervical cancer) and a rough recovery following this with pain/ nausea and vomiting (pelvic hematoma)   She was diagnosed with PUD in this time (EGD noted gastric ulcer)-with anemia  Was started on protonix - she took it until it ran out (did not refill(  Reviewed EGD- non bleeding duodenal ulcer noted in report from 9/4   Was seen here by pcp and mentioned some shortness of breath, DD was elevated, followed by ER visit for CT chest which was reassuring (no PE noted)    CT chest 9/19  IMPRESSION: 1. No evidence of central pulmonary embolus. Evaluation of the segmental and subsegmental pulmonary arteries is limited due to bolus timing. 2. No acute findings in the chest.   CT abd /pelvis on 9/9  IMPRESSION: 1. Interval development of a complex lobulated fluid collection within the pelvis measuring at least 7.2 x 7.6 cm, immediately superior to the vaginal cuff. This may represent a subacute to chronic hematoma given the high attenuation fluid noted on prior examination in this region. There is new infiltration within the omentum anterior to this collection as well as a small sentinel clot which may reflect changes of recurrent hemorrhage in this region. 2. Interval development of mild hemoperitoneum within Morison's pouch and perisplenic space. 3. The distal ureters are obscured by extensive fluid inflammatory change within the pelvis. Delayed images were not obtained and continuity of the ureters is not definitively confirmed  on this examination though extravasation was not identified on prior exam.   Most recent GYN visit was on 9/18  Pelvic hamartoma noted  Advised to use miralax for constipatin  Stage 1 cervical cancer noted and whe was set up for follow up with gyn/onc Dr Pricilla Holm  Was also treated for vaginal d/c with metrogel vaginal (for BV)    Most recent labs Lab Results  Component Value Date   WBC 8.7 01/14/2023   HGB 11.9 (L) 01/14/2023   HCT 36.0 01/14/2023   MCV 90.6 01/14/2023   PLT 551.0 (H) 01/14/2023   Lab Results  Component Value Date   NA 138 01/14/2023   K 3.7 01/14/2023   CO2 25 01/14/2023   GLUCOSE 126 (H) 01/14/2023   BUN 13 01/14/2023   CREATININE 1.00 01/14/2023   CALCIUM 10.2 01/14/2023   GFR 73.22 01/14/2023   GFRNONAA >60 01/01/2023   Lab Results  Component Value Date   ALT 11 01/14/2023   AST 14 01/14/2023   ALKPHOS 63 01/14/2023   BILITOT 0.9 01/14/2023   Urine culture on 9/8 with pan sensitive e coli  This was treated and urine culture 9/19 was clear   Anemia improved as well    Today:   Has sharp pains in lower abdomen  Bilateral and meets in the middle  Feels like spasms (1-3 minutes and then eases up until next one)  Goes on all day  Pain radiates to vagina area  Is more sharp than dull  Popping feeling on the left side as well  Worse to laugh/cough or strain  Worse to walk and to stand from sitting  Occational has to use a walker when shopping due to the pain   Dilaudid and oxycontin did not help in the hospital  Avoiding nsaids due to PUD   Nausea just once since hospital    No longer bleeding  Some discharge still- is using the metrogel   No longer has constipation - taking senna and miralax   No urinary symptoms    Dr Nettie Elm   No pain above the umbilicus Some heartburn comes and goes    Patient Active Problem List   Diagnosis Date Noted   DOE (dyspnea on exertion) 01/08/2023   Abnormal chest x-ray 01/08/2023    Weakness of both lower extremities 01/08/2023   Physical debility 01/08/2023   Post-operative state 01/07/2023   Cervical cancer (HCC) 12/29/2022   Pelvic pain 12/29/2022   Duodenal ulcer 12/24/2022   S/P hysterectomy 12/19/2022   Pelvic hematoma in female 12/16/2022   History of syncope 12/10/2022   Chronic pain of left knee 12/10/2022   Family history of colon cancer 10/10/2015   Past Medical History:  Diagnosis Date   Gestational diabetes    metformin - resolved after delivery   History of UTI    HSV (herpes simplex virus) infection    Migraines    Peripheral vascular disease (HCC)    Post-operative nausea and vomiting 12/19/2022   Seizures (HCC)    Jan.2016 - only had one seizure, none since   Vaginal Pap smear, abnormal    Past Surgical History:  Procedure Laterality Date   BIOPSY  12/24/2022   Procedure: BIOPSY;  Surgeon: Benancio Deeds, MD;  Location: [facility];  Service: Gastroenterology;;   DILATION AND CURETTAGE OF UTERUS     ESOPHAGOGASTRODUODENOSCOPY (EGD) WITH PROPOFOL N/A 12/24/2022   Procedure: ESOPHAGOGASTRODUODENOSCOPY (EGD) WITH PROPOFOL;  Surgeon: Benancio Deeds, MD;  Location: [facility];  Service: Gastroenterology;  Laterality: N/A;   LAPAROSCOPIC TUBAL LIGATION Bilateral 12/08/2018   Procedure: LAPAROSCOPIC TUBAL LIGATION;  Surgeon: Catalina Antigua, MD;  Location: [facility];  Service: Gynecology;  Laterality: Bilateral;   LEEP N/A 03/18/2022   Procedure: LOOP ELECTROSURGICAL EXCISION PROCEDURE (LEEP);  Surgeon: Catalina Antigua, MD;  Location: [facility];  Service: Gynecology;  Laterality: N/A;   VAGINAL HYSTERECTOMY N/A 12/16/2022   Procedure: TOTAL VAGINAL HYSTERECTOMY, BILATERAL SALPINGECTOMY;  Surgeon: Hermina Staggers, MD;  Location: [facility];  Service: Gynecology;  Laterality: N/A;   WISDOM TOOTH EXTRACTION  04/21/2009   Social History   Tobacco Use   Smoking status: Former    Current packs/day: 0.25    Average packs/day: 0.3  packs/day for 11.1 years (2.8 ttl pk-yrs)    Types: Cigarettes    Start date: 12/21/2011    Passive exposure: Never   Smokeless tobacco: Never  Vaping Use   Vaping status: Never Used  Substance Use Topics   Alcohol use: Not Currently    Comment: ocassionally    Drug use: Not Currently    Types: Marijuana    Comment: Last use was in 2023   Family History  Problem Relation Age of Onset   Diabetes Mother    Stroke Mother    Colon cancer Father 79   Hypertension Maternal Grandmother    Diabetes Maternal Grandmother    Hypertension Paternal Grandmother    Breast cancer Neg Hx  Ovarian cancer Neg Hx    Endometrial cancer Neg Hx    Pancreatic cancer Neg Hx    Prostate cancer Neg Hx    No Known Allergies Current Outpatient Medications on File Prior to Visit  Medication Sig Dispense Refill   metroNIDAZOLE (METROGEL) 0.75 % vaginal gel Place 1 applicatorful vaginally at bedtime for 5 days 70 g 1   No current facility-administered medications on file prior to visit.    Review of Systems  Constitutional:  Positive for fatigue. Negative for activity change, appetite change, fever and unexpected weight change.  HENT:  Negative for congestion, ear pain, rhinorrhea, sinus pressure and sore throat.   Eyes:  Negative for pain, redness and visual disturbance.  Respiratory:  Negative for cough, shortness of breath and wheezing.   Cardiovascular:  Negative for chest pain and palpitations.  Gastrointestinal:  Negative for abdominal distention, abdominal pain, anal bleeding, blood in stool, constipation, diarrhea, nausea, rectal pain and vomiting.       No nausea today  Low abd and pelvic pain   No longer constipated   Endocrine: Negative for polydipsia and polyuria.  Genitourinary:  Positive for pelvic pain and vaginal discharge. Negative for dysuria, frequency, menstrual problem, urgency and vaginal bleeding.  Musculoskeletal:  Negative for arthralgias, back pain and myalgias.  Skin:   Negative for pallor and rash.  Allergic/Immunologic: Negative for environmental allergies.  Neurological:  Negative for dizziness, syncope and headaches.  Hematological:  Negative for adenopathy. Does not bruise/bleed easily.  Psychiatric/Behavioral:  Negative for decreased concentration and dysphoric mood. The patient is not nervous/anxious.        Objective:   Physical Exam Constitutional:      General: She is not in acute distress.    Appearance: Normal appearance. She is well-developed. She is obese. She is not ill-appearing or diaphoretic.     Comments: Fatigued appearing   HENT:     Head: Normocephalic and atraumatic.  Eyes:     General: No scleral icterus.       Right eye: No discharge.        Left eye: No discharge.     Conjunctiva/sclera: Conjunctivae normal.     Pupils: Pupils are equal, round, and reactive to light.  Neck:     Thyroid: No thyromegaly.     Vascular: No carotid bruit or JVD.  Cardiovascular:     Rate and Rhythm: Regular rhythm. Tachycardia present.     Heart sounds: Normal heart sounds.     No gallop.  Pulmonary:     Effort: Pulmonary effort is normal. No respiratory distress.     Breath sounds: Normal breath sounds. No stridor. No wheezing, rhonchi or rales.  Abdominal:     General: Abdomen is protuberant. Bowel sounds are normal. There is no distension or abdominal bruit.     Palpations: Abdomen is soft. There is no hepatomegaly, splenomegaly, mass or pulsatile mass.     Tenderness: There is no right CVA tenderness, left CVA tenderness, guarding or rebound.     Hernia: No hernia is present.     Comments: Tender in very low abd/pelvic area  Today right more than left  No pain with percussion  Some discomfort with moving table  No obv hernia   Musculoskeletal:     Cervical back: Normal range of motion and neck supple. No tenderness.     Right lower leg: No edema.     Left lower leg: No edema.  Lymphadenopathy:     Cervical:  No cervical  adenopathy.  Skin:    General: Skin is warm and dry.     Coloration: Skin is not pale.     Findings: No rash.  Neurological:     Mental Status: She is alert.     Coordination: Coordination normal.     Deep Tendon Reflexes: Reflexes are normal and symmetric. Reflexes normal.  Psychiatric:        Mood and Affect: Mood normal.           Assessment & Plan:   Problem List Items Addressed This Visit       Digestive   Duodenal ulcer - Primary    This was found in recent hosp  No doubt was stress related  Overall no symptoms (her pain is pelvic more than abd)  Encouraged her to pick up refill of protonix and take for another 2 mo to get this healed  Encouraged pt to avoid nsaids       Relevant Orders   CBC with Differential/Platelet (Completed)     Genitourinary   Cervical cancer (HCC)    Pt has appointment on 9/27 with gyn onc for this  Found s/p hysterectomy   Of note-pt c/o significant pelvic pain / unsure if related         Other   Pelvic hematoma in female    Communicated with pt's gyn- who does not think this is cause of her pelvic pain   Pt has appointment with gyn -onc on 9/27       Relevant Orders   CBC with Differential/Platelet (Completed)   Pelvic pain    Low - bilateral / switches sides  Worse on right today / worse on left prior  This is since hysterectomy (with hematoma after wards)   Communicated with her gyn - who is unsure where pain is coming from and recommended PT (this was ordered by pcp at last visit)  Currently using tylenol  Previous uti resolved (u culture negative)  Has newly dx cervical cancer and will see gyn-onc on 9/27  Today prescription methocarbamol prescription to help with spasm  Call back and Er precautions noted in detail today   Labs ordered as well in light of mildly increase wbc with last draw        Relevant Orders   Comprehensive metabolic panel (Completed)   CBC with Differential/Platelet (Completed)

## 2023-01-13 NOTE — Transitions of Care (Post Inpatient/ED Visit) (Signed)
Pt was seen [facility] ED on 01/08/23; pt had + d dimer but CT of chest was OK.  Pt still having alot of pressure on rt abd area. still having on and off severe lower abd pain. Now pain level is 7.Pt does not want to go to UC or ED. Hayden Pedro FNP does not have appt the rest of this week and pt scheduled ED FU with Dr Milinda Antis on 01/14/23 at 10 AM with UC & ED precautions and pt voiced understanding. Sending note to Hayden Pedro FNP and Dr Milinda Antis.           01/13/2023  Name: Holly Brown MRN: [id number] DOB: [birth year]  Today's TOC FU Call Status: TOC FU Call Complete Date: 01/13/23 Patient's Name and Date of Birth confirmed.  Transition Care Management Follow-up Telephone Call Date of Discharge: 01/08/23 Discharge Facility: [facility] ([facility]) Type of Discharge: Emergency Department Reason for ED Visit: Other: (+ d dimer but CT chest was OK.) How have you been since you were released from the hospital?: Worse Any questions or concerns?:  (alot of pressure on rt abd area. still having on and off severe lower abd pain.)  Items Reviewed: Did you receive and understand the discharge instructions provided?: Yes Medications obtained,verified, and reconciled?: Yes (Medications Reviewed) (taking stool softener and miralax. last normal BM was today. pt did not look at [facility].) Any new allergies since your discharge?: No Dietary orders reviewed?: NA Do you have support at home?: Yes People in Home: parent(s) Name of Support/Comfort Primary Source: Felicia  Medications Reviewed Today: Medications Reviewed Today   Medications were not reviewed in this encounter     Home Care and Equipment/Supplies: Were Home Health Services Ordered?: NA Any new equipment or medical supplies ordered?: NA  Functional Questionnaire: Do you need assistance with bathing/showering or dressing?: No Do you need assistance with meal preparation?: No Do you need assistance with eating?: No Do you have difficulty  maintaining continence: No Do you need assistance with getting out of bed/getting out of a chair/moving?: No Do you have difficulty managing or taking your medications?: No  Follow up appointments reviewed: PCP Follow-up appointment confirmed?: Yes Date of PCP follow-up appointment?: 01/14/23 Follow-up Provider: Dr Idamae Schuller [facility] Follow-up appointment confirmed?: NA Do you need transportation to your follow-up appointment?: No Do you understand care options if your condition(s) worsen?: Yes-patient verbalized understanding    SIGNATURE Lewanda Rife, LPN

## 2023-01-14 ENCOUNTER — Other Ambulatory Visit (HOSPITAL_COMMUNITY): Payer: Self-pay

## 2023-01-14 ENCOUNTER — Ambulatory Visit (INDEPENDENT_AMBULATORY_CARE_PROVIDER_SITE_OTHER): Payer: 59 | Admitting: Family Medicine

## 2023-01-14 VITALS — BP 106/70 | HR 96 | Temp 97.8°F | Ht 71.0 in | Wt 215.4 lb

## 2023-01-14 DIAGNOSIS — N9489 Other specified conditions associated with female genital organs and menstrual cycle: Secondary | ICD-10-CM

## 2023-01-14 DIAGNOSIS — R102 Pelvic and perineal pain: Secondary | ICD-10-CM | POA: Diagnosis not present

## 2023-01-14 DIAGNOSIS — C539 Malignant neoplasm of cervix uteri, unspecified: Secondary | ICD-10-CM | POA: Diagnosis not present

## 2023-01-14 DIAGNOSIS — K269 Duodenal ulcer, unspecified as acute or chronic, without hemorrhage or perforation: Secondary | ICD-10-CM | POA: Diagnosis not present

## 2023-01-14 LAB — COMPREHENSIVE METABOLIC PANEL
ALT: 11 U/L (ref 0–35)
AST: 14 U/L (ref 0–37)
Albumin: 4.1 g/dL (ref 3.5–5.2)
Alkaline Phosphatase: 63 U/L (ref 39–117)
BUN: 13 mg/dL (ref 6–23)
CO2: 25 mEq/L (ref 19–32)
Calcium: 10.2 mg/dL (ref 8.4–10.5)
Chloride: 103 mEq/L (ref 96–112)
Creatinine, Ser: 1 mg/dL (ref 0.40–1.20)
GFR: 73.22 mL/min (ref >60.00)
Glucose, Bld: 126 mg/dL — ABNORMAL HIGH (ref 70–99)
Potassium: 3.7 mEq/L (ref 3.5–5.1)
Sodium: 138 mEq/L (ref 135–145)
Total Bilirubin: 0.9 mg/dL (ref 0.2–1.2)
Total Protein: 7.6 g/dL (ref 6.0–8.3)

## 2023-01-14 LAB — CBC WITH DIFFERENTIAL/PLATELET
Basophils Absolute: 0.1 10*3/uL (ref 0.0–0.1)
Basophils Relative: 0.7 % (ref 0.0–3.0)
Eosinophils Absolute: 0.1 10*3/uL (ref 0.0–0.7)
Eosinophils Relative: 1.5 % (ref 0.0–5.0)
HCT: 36 % (ref 36.0–46.0)
Hemoglobin: 11.9 g/dL — ABNORMAL LOW (ref 12.0–15.0)
Lymphocytes Relative: 25.4 % (ref 12.0–46.0)
Lymphs Abs: 2.2 10*3/uL (ref 0.7–4.0)
MCHC: 33 g/dL (ref 30.0–36.0)
MCV: 90.6 fl (ref 78.0–100.0)
Monocytes Absolute: 0.6 10*3/uL (ref 0.1–1.0)
Monocytes Relative: 6.4 % (ref 3.0–12.0)
Neutro Abs: 5.7 10*3/uL (ref 1.4–7.7)
Neutrophils Relative %: 66 % (ref 43.0–77.0)
Platelets: 551 10*3/uL — ABNORMAL HIGH (ref 150.0–400.0)
RBC: 3.97 Mil/uL (ref 3.87–5.11)
RDW: 14.5 % (ref 11.5–15.5)
WBC: 8.7 10*3/uL (ref 4.0–10.5)

## 2023-01-14 MED ORDER — METHOCARBAMOL 500 MG PO TABS
500.0000 mg | ORAL_TABLET | Freq: Three times a day (TID) | ORAL | 0 refills | Status: DC | PRN
  Filled 2023-01-14: qty 30, 10d supply, fill #0

## 2023-01-14 MED ORDER — PANTOPRAZOLE SODIUM 40 MG PO TBEC
40.0000 mg | DELAYED_RELEASE_TABLET | Freq: Every day | ORAL | 1 refills | Status: DC
  Filled 2023-01-14: qty 30, 30d supply, fill #0

## 2023-01-14 NOTE — Telephone Encounter (Signed)
Noted and thank you Rena for your assessment, agree with your plan. Dr. Milinda Antis thank you for seeing her this am.

## 2023-01-14 NOTE — Assessment & Plan Note (Signed)
Low - bilateral / switches sides  Worse on right today / worse on left prior  This is since hysterectomy (with hematoma after wards)   Communicated with her gyn - who is unsure where pain is coming from and recommended PT (this was ordered by pcp at last visit)  Currently using tylenol  Previous uti resolved (u culture negative)  Has newly dx cervical cancer and will see gyn-onc on 9/27  Today prescription methocarbamol prescription to help with spasm  Call back and Er precautions noted in detail today   Labs ordered as well in light of mildly increase wbc with last draw

## 2023-01-14 NOTE — Assessment & Plan Note (Signed)
Communicated with pt's gyn- who does not think this is cause of her pelvic pain   Pt has appointment with gyn -onc on 9/27

## 2023-01-14 NOTE — Patient Instructions (Addendum)
To heal your ulcer- go ahead and call pharmacy for refill of generic protonix  Take it for 2 more months   For the pelvic pain  I am going to re check labs today   Then reach out to Dr Alysia Penna to see what is recommended next   Warm compress is ok   Try the methocarbamol as needed for muscle spasm to see if this helps pelvic pain   If symptoms suddenly become severe- go to the ER

## 2023-01-14 NOTE — Assessment & Plan Note (Signed)
Pt has appointment on 9/27 with gyn onc for this  Found s/p hysterectomy   Of note-pt c/o significant pelvic pain / unsure if related

## 2023-01-14 NOTE — Assessment & Plan Note (Signed)
This was found in recent hosp  No doubt was stress related  Overall no symptoms (her pain is pelvic more than abd)  Encouraged her to pick up refill of protonix and take for another 2 mo to get this healed  Encouraged pt to avoid nsaids

## 2023-01-15 NOTE — Progress Notes (Signed)
Thank you  much for seeing her and for relaying this message, I appreciate it.  I have placed referral for PT as well 9/19.

## 2023-01-16 ENCOUNTER — Encounter: Payer: Self-pay | Admitting: Gynecologic Oncology

## 2023-01-16 ENCOUNTER — Inpatient Hospital Stay (HOSPITAL_BASED_OUTPATIENT_CLINIC_OR_DEPARTMENT_OTHER): Payer: 59 | Admitting: Gynecologic Oncology

## 2023-01-16 ENCOUNTER — Inpatient Hospital Stay: Payer: 59 | Attending: Gynecologic Oncology | Admitting: Gynecologic Oncology

## 2023-01-16 VITALS — BP 118/77 | HR 125 | Temp 99.1°F | Resp 20 | Wt 213.0 lb

## 2023-01-16 DIAGNOSIS — R102 Pelvic and perineal pain: Secondary | ICD-10-CM | POA: Diagnosis not present

## 2023-01-16 DIAGNOSIS — N9489 Other specified conditions associated with female genital organs and menstrual cycle: Secondary | ICD-10-CM | POA: Diagnosis not present

## 2023-01-16 DIAGNOSIS — C539 Malignant neoplasm of cervix uteri, unspecified: Secondary | ICD-10-CM

## 2023-01-16 DIAGNOSIS — R0609 Other forms of dyspnea: Secondary | ICD-10-CM

## 2023-01-16 DIAGNOSIS — Z87410 Personal history of cervical dysplasia: Secondary | ICD-10-CM

## 2023-01-16 NOTE — Patient Instructions (Signed)
Preparing for your Surgery  Plan for surgery on February 11, 2023 with Dr. Eugene Garnet at [facility]. You will be scheduled for robotic assisted laparoscopic pelvic lymph node dissection, possible laparotomy (larger incision on the abdomen if needed).   Pre-operative Testing -You will receive a phone call from presurgical testing at [facility] to arrange for a pre-operative appointment and lab work.  -Bring your insurance card, copy of an advanced directive if applicable, medication list  -At that visit, you will be asked to sign a consent for a possible blood transfusion in case a transfusion becomes necessary during surgery.  The need for a blood transfusion is rare but having consent is a necessary part of your care.     -You should not be taking blood thinners or aspirin at least ten days prior to surgery unless instructed by your surgeon.  -Do not take supplements such as fish oil (omega 3), red yeast rice, turmeric before your surgery. You want to avoid medications with aspirin in them including headache powders such as BC or Goody's), Excedrin migraine.  Day Before Surgery at Home -You will be asked to take in a light diet the day before surgery. You will be advised you can have clear liquids up until 3 hours before your surgery.    Eat a light diet the day before surgery.  Examples including soups, broths, toast, yogurt, mashed potatoes.  AVOID GAS PRODUCING FOODS AND BEVERAGES. Things to avoid include carbonated beverages (fizzy beverages, sodas), raw fruits and raw vegetables (uncooked), or beans.   If your bowels are filled with gas, your surgeon will have difficulty visualizing your pelvic organs which increases your surgical risks.  Your role in recovery Your role is to become active as soon as directed by your doctor, while still giving yourself time to heal.  Rest when you feel tired. You will be asked to do the following in order to speed your  recovery:  - Cough and breathe deeply. This helps to clear and expand your lungs and can prevent pneumonia after surgery.  - STAY ACTIVE WHEN YOU GET HOME. Do mild physical activity. Walking or moving your legs help your circulation and body functions return to normal. Do not try to get up or walk alone the first time after surgery.   -If you develop swelling on one leg or the other, pain in the back of your leg, redness/warmth in one of your legs, please call the office or go to the Emergency Room to have a doppler to rule out a blood clot. For shortness of breath, chest pain-seek care in the Emergency Room as soon as possible. - Actively manage your pain. Managing your pain lets you move in comfort. We will ask you to rate your pain on a scale of zero to 10. It is your responsibility to tell your doctor or nurse where and how much you hurt so your pain can be treated.  Special Considerations -If you are diabetic, you may be placed on insulin after surgery to have closer control over your blood sugars to promote healing and recovery.  This does not mean that you will be discharged on insulin.  If applicable, your oral antidiabetics will be resumed when you are tolerating a solid diet.  -Your final pathology results from surgery should be available around one week after surgery and the results will be relayed to you when available.  -Dr. Antionette Char is the surgeon that assists your GYN Oncologist with  surgery.  If you end up staying the night, the next day after your surgery you will either see Dr. Pricilla Holm, Dr. Alvester Morin, or Dr. Antionette Char.  -FMLA forms can be faxed to [phone] and please allow 5-7 business days for completion.  Pain Management After Surgery -You will be prescribed closer to the surgery date your pain medication and bowel regimen medications before surgery so that you can have these available when you are discharged from the hospital. The pain medication is for use  ONLY AFTER surgery and a new prescription will not be given.   -Make sure that you have Tylenol and Ibuprofen IF YOU ARE ABLE TO TAKE THESE MEDICATIONS at home to use on a regular basis after surgery for pain control. We recommend alternating the medications every hour to six hours since they work differently and are processed in the body differently for pain relief.  -Review the attached handout on narcotic use and their risks and side effects.   Bowel Regimen -You will be prescribed Sennakot-S to take nightly to prevent constipation especially if you are taking the narcotic pain medication intermittently.  It is important to prevent constipation and drink adequate amounts of liquids. You can stop taking this medication when you are not taking pain medication and you are back on your normal bowel routine.  Risks of Surgery Risks of surgery are low but include bleeding, infection, damage to surrounding structures, re-operation, blood clots, and very rarely death.   Blood Transfusion Information (For the consent to be signed before surgery)  We will be checking your blood type before surgery so in case of emergencies, we will know what type of blood you would need.                                            WHAT IS A BLOOD TRANSFUSION?  A transfusion is the replacement of blood or some of its parts. Blood is made up of multiple cells which provide different functions. Red blood cells carry oxygen and are used for blood loss replacement. White blood cells fight against infection. Platelets control bleeding. Plasma helps clot blood. Other blood products are available for specialized needs, such as hemophilia or other clotting disorders. BEFORE THE TRANSFUSION  Who gives blood for transfusions?  You may be able to donate blood to be used at a later date on yourself (autologous donation). Relatives can be asked to donate blood. This is generally not any safer than if you have received blood  from a stranger. The same precautions are taken to ensure safety when a relative's blood is donated. Healthy volunteers who are fully evaluated to make sure their blood is safe. This is blood bank blood. Transfusion therapy is the safest it has ever been in the practice of medicine. Before blood is taken from a donor, a complete history is taken to make sure that person has no history of diseases nor engages in risky social behavior (examples are intravenous drug use or sexual activity with multiple partners). The donor's travel history is screened to minimize risk of transmitting infections, such as malaria. The donated blood is tested for signs of infectious diseases, such as HIV and hepatitis. The blood is then tested to be sure it is compatible with you in order to minimize the chance of a transfusion reaction. If you or a relative donates blood, this is often  done in anticipation of surgery and is not appropriate for emergency situations. It takes many days to process the donated blood. RISKS AND COMPLICATIONS Although transfusion therapy is very safe and saves many lives, the main dangers of transfusion include:  Getting an infectious disease. Developing a transfusion reaction. This is an allergic reaction to something in the blood you were given. Every precaution is taken to prevent this. The decision to have a blood transfusion has been considered carefully by your caregiver before blood is given. Blood is not given unless the benefits outweigh the risks.  AFTER SURGERY INSTRUCTIONS  Return to work: 4-6 weeks if applicable  Activity: 1. Be up and out of the bed during the day.  Take a nap if needed.  You may walk up steps but be careful and use the hand rail.  Stair climbing will tire you more than you think, you may need to stop part way and rest.   2. No lifting or straining for 6 weeks over 10 pounds. No pushing, pulling, straining for 6 weeks.  3. No driving for around 1 week(s).  Do  not drive if you are taking narcotic pain medicine and make sure that your reaction time has returned.   4. You can shower as soon as the next day after surgery. Shower daily.  Use your regular soap and water (not directly on the incision) and pat your incision(s) dry afterwards; don't rub.  No tub baths or submerging your body in water until cleared by your surgeon. If you have the soap that was given to you by pre-surgical testing that was used before surgery, you do not need to use it afterwards because this can irritate your incisions.   5. No sexual activity and nothing in the vagina as recommended by Dr. Alysia Penna.  6. You may experience a small amount of clear drainage from your incisions, which is normal.  If the drainage persists, increases, or changes color please call the office.  7. Do not use creams, lotions, or ointments such as neosporin on your incisions after surgery until advised by your surgeon because they can cause removal of the dermabond glue on your incisions.    8. Take Tylenol or ibuprofen first for pain if you are able to take these medications and only use narcotic pain medication for severe pain not relieved by the Tylenol or Ibuprofen.  Monitor your Tylenol intake to a max of 4,000 mg in a 24 hour period. You can alternate these medications after surgery.  Diet: 1. Low sodium Heart Healthy Diet is recommended but you are cleared to resume your normal (before surgery) diet after your procedure.  2. It is safe to use a laxative, such as Miralax or Colace, if you have difficulty moving your bowels. You will be prescribed Sennakot-S to take at bedtime every evening after surgery to keep bowel movements regular and to prevent constipation.    Wound Care: 1. Keep clean and dry.  Shower daily.  Reasons to call the Doctor: Fever - Oral temperature greater than 100.4 degrees Fahrenheit Foul-smelling vaginal discharge Difficulty urinating Nausea and vomiting Increased pain at  the site of the incision that is unrelieved with pain medicine. Difficulty breathing with or without chest pain New calf pain especially if only on one side Sudden, continuing increased vaginal bleeding with or without clots.   Contacts: For questions or concerns you should contact:  Dr. Eugene Garnet at [phone]  Warner Mccreedy, NP at [phone]  After Hours: call  [phone] and have the GYN Oncologist paged/contacted (after 5 pm or on the weekends). You will speak with an after hours RN and let he or she know you have had surgery.  Messages sent via mychart are for non-urgent matters and are not responded to after hours so for urgent needs, please call the after hours number.

## 2023-01-16 NOTE — H&P (View-Only) (Signed)
GYNECOLOGIC ONCOLOGY NEW PATIENT CONSULTATION   Patient Name: Holly Brown  Patient Age: 37 y.o. Date of Service: 01/16/23 Referring Provider: Dr. 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  Reviewed in detail patient's history leading up to her recent surgery with regard to cervical dysplasia and pathology from her surgery.  Patient was given a copy of her pathology report.  Patient meets conservative surgery criteria including having negative margins, no LVI, grade 2 squamous cell carcinoma, tumor less than 2 cm with a depth of invasion less than 1 cm.  Additionally, imaging is negative for metastatic disease.  We discussed per NCCN guidelines proceeding either with pelvic lymphadenectomy or radiation with consideration of sensitizing platinum agent.  We discussed short and long-term side effects related to radiation therapy.  I would recommend proceeding with surgery given the possibility that any adjuvant therapy could be deferred if lymph nodes are negative for metastatic disease.  We also spent some time discussing her prolonged postoperative course.  The patient has some hesitation about a second surgery, which is very understandable.  After a long discussion, she ultimately voiced wanting to proceed with surgical evaluation of her lymph nodes.  She continues to have some shortness of breath with activity, intermittent palpitations.  Heart rate was normal at rest.  Oxygen saturation was not 100%.  She has had recent labs that were overall very reassuring and a CT angio last week that was negative for pulmonary embolism.  I encouraged her to continue working on increasing her activity as tolerated.  I would typically recommend surgery 6 weeks after her last but think that waiting closer to 7-8 will be beneficial to allow  her additional recovery.  We discussed that given postoperative hematoma/fluid collection development, there may be additional adhesive disease within the pelvis.  Pelvic exam was deferred today given recent exam with her OB/GYN and plan for follow-up next week.  I encouraged her to reach out to her OB/GYN for any postoperative needs.  We discussed the plan for a robotic assisted lymph node dissection, possible laparotomy, any other indicated procedures. The risks of surgery were discussed in detail and she understands these to include infection; wound separation; hernia; injury to adjacent organs such as bowel, bladder, blood vessels, ureters and nerves; bleeding which may require blood transfusion; anesthesia risk; thromboembolic events; possible death; unforeseen complications; possible need for re-exploration; medical complications such as heart attack, stroke, pleural effusion and pneumonia; and, if full lymphadenectomy is performed the risk of lymphedema and lymphocyst. The patient will receive DVT and antibiotic prophylaxis as indicated. She voiced a clear understanding. She had the opportunity to ask questions. Perioperative instructions were reviewed with her. Prescriptions for post-op medications were sent to her pharmacy of choice.  A copy of this note was sent to the patient's referring provider.   65 minutes of total time was spent for this patient encounter, including preparation, face-to-face counseling with the patient and coordination of care, and documentation of the encounter.  Eugene Garnet, MD  Division of Gynecologic Oncology  Department of Obstetrics and Gynecology  [facility]  ___________________________________________  Chief Complaint: Chief Complaint  Patient presents with   Malignant neoplasm of cervix, unspecified site [facility])    History of Present Illness:  Holly Brown is a 37 y.o. y.o. female who is seen in consultation at  the  request of Dr. Nettie Elm for an evaluation of cervical cancer.  Patient has a long history of abnormal uterine bleeding refractory to medical management.  History also notable for cervical dysplasia.  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  Repeat Pap in 02/2022 was again HSIL, HPV 16+.  Patient was also found to be positive for chlamydia and trichomoniasis at that time.  Cold knife cone biopsy was performed on 03/18/2022 revealing CIN-2-3 with extension into underlying endocervical glands, negative for invasive carcinoma.  Endocervical margin was positive for high-grade dysplasia.  STD testing in March 2024 again showed patient positive for chlamydia and trichomoniasis.  More recently, Pap test on 10/13/2022 was HSIL, high risk HPV positive.  2 cervical biopsies both showed CIN-1, ECC showed CIN-2-3.  In the setting of abnormal uterine bleeding, the patient was offered definitive surgical management and on 12/16/2022 underwent TVH lateral salpingectomy.  This included a 10-week sized uterus with normal tubes and ovaries.  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  Lymphovascular invasion not identified.  All margins were negative for invasive carcinoma and high-grade dysplasia.  Patient was ultimately admitted approximately 2 weeks postop with tachycardia, leukocytosis, and CT scan demonstrating a 7 cm fluid collection superior to the vaginal cuff concerning for hematoma.  Patient was treated conservatively with IV antibiotics during her hospitalization, discharged on Augmentin.  Patient was also found to have a pansensitive E. coli UTI, was treated for her known peptic ulcer disease.  Discharged on 9/12.  She was then seen by her primary care provider and given elevated D-dimer in the setting of dyspnea on exertion since her surgery, patient was sent to the  emergency department for evaluation and to rule out pulmonary embolism.  CT angio negative for PE.  Patient notes today by herself.  Her sister joins [place] for part of the visit by phone.  She still notes having some lower abdominal/pelvic pain, mostly on the right side now.  This is most noticeable when she is moving.  Overall she has noted slow improvement in her pain.  Describes pain as sharp when she has it.  She is still taking Senokot and MiraLAX for constipation, bowel function slowly improving.  She has a little pressure when she urinates, otherwise denies any urinary symptoms.  Denies any vaginal bleeding.  Has had some discharge since surgery.  Saw her OB/GYN last week and using vaginal metronidazole.  Plan is for follow-up with him next week.  Notes appetite has been so-so.  Still endorses shortness of breath, when talking or moving.  This is about the same.  Intermittently feels palpitations.  PAST MEDICAL HISTORY:  Past Medical History:  Diagnosis Date   Gestational diabetes    metformin - resolved after delivery   History of UTI    HSV (herpes simplex virus) infection    Migraines    Peripheral vascular disease (HCC)    Post-operative nausea and vomiting 12/19/2022   Seizures (HCC)    Jan.2016 - only had one seizure, none since   Vaginal Pap smear, abnormal      PAST SURGICAL HISTORY:  Past Surgical History:  Procedure Laterality Date   BIOPSY  12/24/2022   Procedure: BIOPSY;  Surgeon: Benancio Deeds, MD;  Location: [facility] ENDOSCOPY;  Service: Gastroenterology;;   DILATION AND CURETTAGE OF UTERUS  ESOPHAGOGASTRODUODENOSCOPY (EGD) WITH PROPOFOL N/A 12/24/2022   Procedure: ESOPHAGOGASTRODUODENOSCOPY (EGD) WITH PROPOFOL;  Surgeon: Benancio Deeds, MD;  Location[facility] ENDOSCOPY;  Service: Gastroenterology;  Laterality: N/A;   LAPAROSCOPIC TUBAL LIGATION Bilateral 12/08/2018   Procedure: LAPAROSCOPIC TUBAL LIGATION;  Surgeon: Catalina Antigua, MD;  Location: [facility];  Service: Gynecology;  Laterality: Bilateral;   LEEP N/A 03/18/2022   Procedure: LOOP ELECTROSURGICAL EXCISION PROCEDURE (LEEP);  Surgeon: Catalina Antigua, MD;  Location: [facility] OR;  Service: Gynecology;  Laterality: N/A;   VAGINAL HYSTERECTOMY N/A 12/16/2022   Procedure: TOTAL VAGINAL HYSTERECTOMY, BILATERAL SALPINGECTOMY;  Surgeon: Hermina Staggers, MD;  Location: [facility] OR;  Service: Gynecology;  Laterality: N/A;   WISDOM TOOTH EXTRACTION  04/21/2009    OB/GYN HISTORY:  OB History  Gravida Para Term Preterm AB Living  5 4 3 1 1 4   SAB IAB Ectopic Multiple Live Births  1     0 4    # Outcome Date GA Lbr Len/2nd Weight Sex Type Anes PTL Lv  5 Term 08/31/18 [redacted]w[redacted]d 03:39 6 lb 15.6 oz (3.164 kg) F Vag-Spont None  LIV  4 Term 11/08/12 [redacted]w[redacted]d  7 lb 12.6 oz (3.532 kg) M Vag-Spont None  LIV     Birth Comments: No complications during pregnancy or delivery.  No special care nursery, went home with mother after 2 days.  3 Preterm 08/04/09 [redacted]w[redacted]d  7 lb 6 oz (3.345 kg) M Vag-Spont  Y LIV  2 Term 10/02/08    F Vag-Spont   LIV  1 SAB             No LMP recorded (lmp unknown). Patient has had a hysterectomy.  Age at menarche: 88  Hx of STDs: yes, see HPI; also history of HSV Last pap: see HPI History of abnormal pap smears: yes  SCREENING STUDIES:  Last mammogram: n/a  Last colonoscopy: n/a, endoscopy in 12/2022  MEDICATIONS: Outpatient Encounter Medications as of 01/16/2023  Medication Sig   metroNIDAZOLE (METROGEL) 0.75 % vaginal gel Place 1 applicatorful vaginally at bedtime for 5 days   polyethylene glycol powder (GLYCOLAX/MIRALAX) 17 GM/SCOOP powder Take 1 Container by mouth once.   sennosides-docusate sodium (SENOKOT-S) 8.6-50 MG tablet Take 1 tablet by mouth daily.   No facility-administered encounter medications on file as of 01/16/2023.    ALLERGIES:  No Known Allergies   FAMILY HISTORY:  Family History  Problem Relation Age of Onset   Diabetes Mother    Stroke Mother     Colon cancer Father 43   Hypertension Maternal Grandmother    Diabetes Maternal Grandmother    Hypertension Paternal Grandmother    Breast cancer Neg Hx    Ovarian cancer Neg Hx    Endometrial cancer Neg Hx    Pancreatic cancer Neg Hx    Prostate cancer Neg Hx      SOCIAL HISTORY:  Social Connections: Moderately Isolated (01/14/2023)   Social Connection and Isolation Panel [NHANES]    Frequency of Communication with Friends and Family: More than three times a week    Frequency of Social Gatherings with Friends and Family: Never    Attends Religious Services: 1 to 4 times per year    Active Member of [employer] or Organizations: No    Attends Engineer, structural: Not on file    Marital Status: Never married    REVIEW OF SYSTEMS:  Denies appetite changes, fevers, chills, fatigue, unexplained weight changes. Denies hearing loss, neck lumps or masses, mouth  sores, ringing in ears or voice changes. Denies cough or wheezing.  Denies shortness of breath. Denies chest pain or palpitations. Denies leg swelling. Denies abdominal distention, pain, blood in stools, constipation, diarrhea, nausea, vomiting, or early satiety. Denies pain with intercourse, dysuria, frequency, hematuria or incontinence. Denies hot flashes, pelvic pain, vaginal bleeding or vaginal discharge.   Denies joint pain, back pain or muscle pain/cramps. Denies itching, rash, or wounds. Denies dizziness, headaches, numbness or seizures. Denies swollen lymph nodes or glands, denies easy bruising or bleeding. Denies anxiety, depression, confusion, or decreased concentration.  Physical Exam:  Vital Signs for this encounter:  Blood pressure 118/77, pulse (!) 125, temperature 99.1 F (37.3 C), resp. rate 20, weight 213 lb (96.6 kg), SpO2 100%. Body mass index is 29.71 kg/m. General: Alert, oriented, no acute distress.  HEENT: Normocephalic, atraumatic. Sclera anicteric.  Chest: Clear to auscultation bilaterally. No  wheezes, rhonchi, or rales. Cardiovascular: HR in 80s, regular rhythm, no murmurs, rubs, or gallops.  Abdomen: Normoactive bowel sounds. Soft, nondistended, nontender to palpation. No masses or hepatosplenomegaly appreciated. No palpable fluid wave.  Extremities: Grossly normal range of motion. Warm, well perfused. No edema bilaterally.  Skin: No rashes or lesions.  Lymphatics: No cervical, supraclavicular, or inguinal adenopathy.  GU:  Deferred. Plan for pelvic exam at the time of upcoming surgery.  LABORATORY AND RADIOLOGIC DATA:  Outside medical records were reviewed to synthesize the above history, along with the history and physical obtained during the visit.   Lab Results  Component Value Date   WBC 8.7 01/14/2023   HGB 11.9 (L) 01/14/2023   HCT 36.0 01/14/2023   PLT 551.0 (H) 01/14/2023   GLUCOSE 126 (H) 01/14/2023   ALT 11 01/14/2023   AST 14 01/14/2023   NA 138 01/14/2023   K 3.7 01/14/2023   CL 103 01/14/2023   CREATININE 1.00 01/14/2023   BUN 13 01/14/2023   CO2 25 01/14/2023   TSH 1.190 10/10/2015   INR 1.0 12/28/2022

## 2023-01-16 NOTE — Progress Notes (Signed)
Patient here for new patient consultation with Dr. Pricilla Holm and for a pre-operative appointment prior to her scheduled surgery on February 11, 2023. She is scheduled for robotic assisted laparoscopic pelvic lymph node dissection, possible laparotomy.  The surgery was discussed in detail.  See after visit summary for additional details.    Discussed post-op pain management in detail including the aspects of the enhanced recovery pathway.  Advised her that a new prescription would be sent in closer to the surgery date for pain medication and it is only to be used for after her upcoming surgery.  We discussed the use of tylenol post-op and to monitor for a maximum of 4,000 mg in a 24 hour period.  Pt has been taking sennakot and advised to continue this.  Discussed bowel regimen in detail.     Discussed measures to take at home to prevent DVT including frequent mobility.  Reportable signs and symptoms of DVT discussed. Post-operative instructions discussed and expectations for after surgery. Incisional care discussed as well including reportable signs and symptoms including erythema, drainage, wound separation.     10 minutes spent preparing information and with the patient.  Verbalizing understanding of material discussed. No needs or concerns voiced at the end of the visit.   Advised patient to call for any needs.  Advised that her post-operative medications had been prescribed and could be picked up at any time.    This appointment is included in the global surgical bundle as pre-operative teaching and has no charge.

## 2023-01-16 NOTE — Progress Notes (Signed)
GYNECOLOGIC ONCOLOGY NEW PATIENT CONSULTATION   Patient Name: Holly Brown  Patient Age: 37 y.o. Date of Service: 01/16/23 Referring Provider: Dr. 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  Reviewed in detail patient's history leading up to her recent surgery with regard to cervical dysplasia and pathology from her surgery.  Patient was given a copy of her pathology report.  Patient meets conservative surgery criteria including having negative margins, no LVI, grade 2 squamous cell carcinoma, tumor less than 2 cm with a depth of invasion less than 1 cm.  Additionally, imaging is negative for metastatic disease.  We discussed per NCCN guidelines proceeding either with pelvic lymphadenectomy or radiation with consideration of sensitizing platinum agent.  We discussed short and long-term side effects related to radiation therapy.  I would recommend proceeding with surgery given the possibility that any adjuvant therapy could be deferred if lymph nodes are negative for metastatic disease.  We also spent some time discussing her prolonged postoperative course.  The patient has some hesitation about a second surgery, which is very understandable.  After a long discussion, she ultimately voiced wanting to proceed with surgical evaluation of her lymph nodes.  She continues to have some shortness of breath with activity, intermittent palpitations.  Heart rate was normal at rest.  Oxygen saturation was not 100%.  She has had recent labs that were overall very reassuring and a CT angio last week that was negative for pulmonary embolism.  I encouraged her to continue working on increasing her activity as tolerated.  I would typically recommend surgery 6 weeks after her last but think that waiting closer to 7-8 will be beneficial to allow  her additional recovery.  We discussed that given postoperative hematoma/fluid collection development, there may be additional adhesive disease within the pelvis.  Pelvic exam was deferred today given recent exam with her OB/GYN and plan for follow-up next week.  I encouraged her to reach out to her OB/GYN for any postoperative needs.  We discussed the plan for a robotic assisted lymph node dissection, possible laparotomy, any other indicated procedures. The risks of surgery were discussed in detail and she understands these to include infection; wound separation; hernia; injury to adjacent organs such as bowel, bladder, blood vessels, ureters and nerves; bleeding which may require blood transfusion; anesthesia risk; thromboembolic events; possible death; unforeseen complications; possible need for re-exploration; medical complications such as heart attack, stroke, pleural effusion and pneumonia; and, if full lymphadenectomy is performed the risk of lymphedema and lymphocyst. The patient will receive DVT and antibiotic prophylaxis as indicated. She voiced a clear understanding. She had the opportunity to ask questions. Perioperative instructions were reviewed with her. Prescriptions for post-op medications were sent to her pharmacy of choice.  A copy of this note was sent to the patient's referring provider.   65 minutes of total time was spent for this patient encounter, including preparation, face-to-face counseling with the patient and coordination of care, and documentation of the encounter.  Eugene Garnet, MD  Division of Gynecologic Oncology  Department of Obstetrics and Gynecology  [facility]  ___________________________________________  Chief Complaint: Chief Complaint  Patient presents with   Malignant neoplasm of cervix, unspecified site [facility])    History of Present Illness:  Holly Brown is a 37 y.o. y.o. female who is seen in consultation at  the  request of Dr. Nettie Elm for an evaluation of cervical cancer.  Patient has a long history of abnormal uterine bleeding refractory to medical management.  History also notable for cervical dysplasia.  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  Repeat Pap in 02/2022 was again HSIL, HPV 16+.  Patient was also found to be positive for chlamydia and trichomoniasis at that time.  Cold knife cone biopsy was performed on 03/18/2022 revealing CIN-2-3 with extension into underlying endocervical glands, negative for invasive carcinoma.  Endocervical margin was positive for high-grade dysplasia.  STD testing in March 2024 again showed patient positive for chlamydia and trichomoniasis.  More recently, Pap test on 10/13/2022 was HSIL, high risk HPV positive.  2 cervical biopsies both showed CIN-1, ECC showed CIN-2-3.  In the setting of abnormal uterine bleeding, the patient was offered definitive surgical management and on 12/16/2022 underwent TVH lateral salpingectomy.  This included a 10-week sized uterus with normal tubes and ovaries.  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  Lymphovascular invasion not identified.  All margins were negative for invasive carcinoma and high-grade dysplasia.  Patient was ultimately admitted approximately 2 weeks postop with tachycardia, leukocytosis, and CT scan demonstrating a 7 cm fluid collection superior to the vaginal cuff concerning for hematoma.  Patient was treated conservatively with IV antibiotics during her hospitalization, discharged on Augmentin.  Patient was also found to have a pansensitive E. coli UTI, was treated for her known peptic ulcer disease.  Discharged on 9/12.  She was then seen by her primary care provider and given elevated D-dimer in the setting of dyspnea on exertion since her surgery, patient was sent to the  emergency department for evaluation and to rule out pulmonary embolism.  CT angio negative for PE.  Patient notes today by herself.  Her sister joins [place] for part of the visit by phone.  She still notes having some lower abdominal/pelvic pain, mostly on the right side now.  This is most noticeable when she is moving.  Overall she has noted slow improvement in her pain.  Describes pain as sharp when she has it.  She is still taking Senokot and MiraLAX for constipation, bowel function slowly improving.  She has a little pressure when she urinates, otherwise denies any urinary symptoms.  Denies any vaginal bleeding.  Has had some discharge since surgery.  Saw her OB/GYN last week and using vaginal metronidazole.  Plan is for follow-up with him next week.  Notes appetite has been so-so.  Still endorses shortness of breath, when talking or moving.  This is about the same.  Intermittently feels palpitations.  PAST MEDICAL HISTORY:  Past Medical History:  Diagnosis Date   Gestational diabetes    metformin - resolved after delivery   History of UTI    HSV (herpes simplex virus) infection    Migraines    Peripheral vascular disease (HCC)    Post-operative nausea and vomiting 12/19/2022   Seizures (HCC)    Jan.2016 - only had one seizure, none since   Vaginal Pap smear, abnormal      PAST SURGICAL HISTORY:  Past Surgical History:  Procedure Laterality Date   BIOPSY  12/24/2022   Procedure: BIOPSY;  Surgeon: Benancio Deeds, MD;  Location: [facility] ENDOSCOPY;  Service: Gastroenterology;;   DILATION AND CURETTAGE OF UTERUS  ESOPHAGOGASTRODUODENOSCOPY (EGD) WITH PROPOFOL N/A 12/24/2022   Procedure: ESOPHAGOGASTRODUODENOSCOPY (EGD) WITH PROPOFOL;  Surgeon: Benancio Deeds, MD;  Location[facility] END[facility]COPY;  Service: Gastroenterology;  Laterality: N/A;   LAPAROSCOPIC TUBAL LIGATION Bilateral 12/08/2018   Procedure: LAPAROSCOPIC TUBAL LIGATION;  Surgeon: Catalina Antigua, MD;  Location: [facility];  Service: Gynecology;  Laterality: Bilateral;   LEEP N/A 03/18/2022   Procedure: LOOP ELECTROSURGICAL EXCISION PROCEDURE (LEEP);  Surgeon: Catalina Antigua, MD;  Location: [facility] OR;  Service: Gynecology;  Laterality: N/A;   VAGINAL HYSTERECTOMY N/A 12/16/2022   Procedure: TOTAL VAGINAL HYSTERECTOMY, BILATERAL SALPINGECTOMY;  Surgeon: Hermina Staggers, MD;  Location: [facility] OR;  Service: Gynecology;  Laterality: N/A;   WISDOM TOOTH EXTRACTION  04/21/2009    OB/GYN HISTORY:  OB History  Gravida Para Term Preterm AB Living  5 4 3 1 1 4   SAB IAB Ectopic Multiple Live Births  1     0 4    # Outcome Date GA Lbr Len/2nd Weight Sex Type Anes PTL Lv  5 Term 08/31/18 [redacted]w[redacted]d 03:39 6 lb 15.6 oz (3.164 kg) F Vag-Spont None  LIV  4 Term 11/08/12 [redacted]w[redacted]d  7 lb 12.6 oz (3.532 kg) M Vag-Spont None  LIV     Birth Comments: No complications during pregnancy or delivery.  No special care nursery, went home with mother after 2 days.  3 Preterm 08/04/09 [redacted]w[redacted]d  7 lb 6 oz (3.345 kg) M Vag-Spont  Y LIV  2 Term 10/02/08    F Vag-Spont   LIV  1 SAB             No LMP recorded (lmp unknown). Patient has had a hysterectomy.  Age at menarche: 88  Hx of STDs: yes, see HPI; also history of HSV Last pap: see HPI History of abnormal pap smears: yes  SCREENING STUDIES:  Last mammogram: n/a  Last colonoscopy: n/a, endoscopy in 12/2022  MEDICATIONS: Outpatient Encounter Medications as of 01/16/2023  Medication Sig   metroNIDAZOLE (METROGEL) 0.75 % vaginal gel Place 1 applicatorful vaginally at bedtime for 5 days   polyethylene glycol powder (GLYCOLAX/MIRALAX) 17 GM/SCOOP powder Take 1 Container by mouth once.   sennosides-docusate sodium (SENOKOT-S) 8.6-50 MG tablet Take 1 tablet by mouth daily.   No facility-administered encounter medications on file as of 01/16/2023.    ALLERGIES:  No Known Allergies   FAMILY HISTORY:  Family History  Problem Relation Age of Onset   Diabetes Mother    Stroke Mother     Colon cancer Father 43   Hypertension Maternal Grandmother    Diabetes Maternal Grandmother    Hypertension Paternal Grandmother    Breast cancer Neg Hx    Ovarian cancer Neg Hx    Endometrial cancer Neg Hx    Pancreatic cancer Neg Hx    Prostate cancer Neg Hx      SOCIAL HISTORY:  Social Connections: Moderately Isolated (01/14/2023)   Social Connection and Isolation Panel [NHANES]    Frequency of Communication with Friends and Family: More than three times a week    Frequency of Social Gatherings with Friends and Family: Never    Attends Religious Services: 1 to 4 times per year    Active Member of [employer] or Organizations: No    Attends Engineer, structural: Not on file    Marital Status: Never married    REVIEW OF SYSTEMS:  Denies appetite changes, fevers, chills, fatigue, unexplained weight changes. Denies hearing loss, neck lumps or masses, mouth  sores, ringing in ears or voice changes. Denies cough or wheezing.  Denies shortness of breath. Denies chest pain or palpitations. Denies leg swelling. Denies abdominal distention, pain, blood in stools, constipation, diarrhea, nausea, vomiting, or early satiety. Denies pain with intercourse, dysuria, frequency, hematuria or incontinence. Denies hot flashes, pelvic pain, vaginal bleeding or vaginal discharge.   Denies joint pain, back pain or muscle pain/cramps. Denies itching, rash, or wounds. Denies dizziness, headaches, numbness or seizures. Denies swollen lymph nodes or glands, denies easy bruising or bleeding. Denies anxiety, depression, confusion, or decreased concentration.  Physical Exam:  Vital Signs for this encounter:  Blood pressure 118/77, pulse (!) 125, temperature 99.1 F (37.3 C), resp. rate 20, weight 213 lb (96.6 kg), SpO2 100%. Body mass index is 29.71 kg/m. General: Alert, oriented, no acute distress.  HEENT: Normocephalic, atraumatic. Sclera anicteric.  Chest: Clear to auscultation bilaterally. No  wheezes, rhonchi, or rales. Cardiovascular: HR in 80s, regular rhythm, no murmurs, rubs, or gallops.  Abdomen: Normoactive bowel sounds. Soft, nondistended, nontender to palpation. No masses or hepatosplenomegaly appreciated. No palpable fluid wave.  Extremities: Grossly normal range of motion. Warm, well perfused. No edema bilaterally.  Skin: No rashes or lesions.  Lymphatics: No cervical, supraclavicular, or inguinal adenopathy.  GU:  Deferred. Plan for pelvic exam at the time of upcoming surgery.  LABORATORY AND RADIOLOGIC DATA:  Outside medical records were reviewed to synthesize the above history, along with the history and physical obtained during the visit.   Lab Results  Component Value Date   WBC 8.7 01/14/2023   HGB 11.9 (L) 01/14/2023   HCT 36.0 01/14/2023   PLT 551.0 (H) 01/14/2023   GLUCOSE 126 (H) 01/14/2023   ALT 11 01/14/2023   AST 14 01/14/2023   NA 138 01/14/2023   K 3.7 01/14/2023   CL 103 01/14/2023   CREATININE 1.00 01/14/2023   BUN 13 01/14/2023   CO2 25 01/14/2023   TSH 1.190 10/10/2015   INR 1.0 12/28/2022

## 2023-01-20 ENCOUNTER — Other Ambulatory Visit: Payer: Self-pay | Admitting: Gynecologic Oncology

## 2023-01-20 ENCOUNTER — Other Ambulatory Visit (HOSPITAL_COMMUNITY): Payer: Self-pay

## 2023-01-20 DIAGNOSIS — C539 Malignant neoplasm of cervix uteri, unspecified: Secondary | ICD-10-CM

## 2023-01-21 ENCOUNTER — Telehealth: Payer: Self-pay

## 2023-01-21 NOTE — Telephone Encounter (Signed)
Disability forms received, filled out and faxed to [facility].   Pt is aware.

## 2023-01-22 ENCOUNTER — Ambulatory Visit (HOSPITAL_BASED_OUTPATIENT_CLINIC_OR_DEPARTMENT_OTHER): Payer: 59 | Attending: Family | Admitting: Physical Therapy

## 2023-01-22 DIAGNOSIS — M6281 Muscle weakness (generalized): Secondary | ICD-10-CM | POA: Diagnosis not present

## 2023-01-22 DIAGNOSIS — R5381 Other malaise: Secondary | ICD-10-CM | POA: Insufficient documentation

## 2023-01-22 DIAGNOSIS — R29898 Other symptoms and signs involving the musculoskeletal system: Secondary | ICD-10-CM | POA: Diagnosis not present

## 2023-01-22 DIAGNOSIS — R262 Difficulty in walking, not elsewhere classified: Secondary | ICD-10-CM | POA: Diagnosis not present

## 2023-01-22 NOTE — Therapy (Incomplete)
OUTPATIENT PHYSICAL THERAPY LOWER EXTREMITY EVALUATION   Patient Name: Holly Brown MRN: [id number] DOB:1985-05-01, 37 y.o., female Today's Date: 01/23/2023  END OF SESSION:  PT End of Session - 01/22/23 1542     Visit Number 1    Number of Visits 13    Date for PT Re-Evaluation 03/19/23    Authorization Type AETNA / MCD    PT Start Time 1540    PT Stop Time 1622    PT Time Calculation (min) 42 min    Activity Tolerance Patient tolerated treatment well    Behavior During Therapy WFL for tasks assessed/performed             Past Medical History:  Diagnosis Date   Gestational diabetes    metformin - resolved after delivery   History of UTI    HSV (herpes simplex virus) infection    Migraines    Peripheral vascular disease (HCC)    Post-operative nausea and vomiting 12/19/2022   Seizures (HCC)    Jan.2016 - only had one seizure, none since   Vaginal Pap smear, abnormal    Past Surgical History:  Procedure Laterality Date   BIOPSY  12/24/2022   Procedure: BIOPSY;  Surgeon: Benancio Deeds, MD;  Location: [facility];  Service: Gastroenterology;;   DILATION AND CURETTAGE OF UTERUS     ESOPHAGOGASTRODUODENOSCOPY (EGD) WITH PROPOFOL N/A 12/24/2022   Procedure: ESOPHAGOGASTRODUODENOSCOPY (EGD) WITH PROPOFOL;  Surgeon: Benancio Deeds, MD;  Location: [facility];  Service: Gastroenterology;  Laterality: N/A;   LAPAROSCOPIC TUBAL LIGATION Bilateral 12/08/2018   Procedure: LAPAROSCOPIC TUBAL LIGATION;  Surgeon: Catalina Antigua, MD;  Location: [facility];  Service: Gynecology;  Laterality: Bilateral;   LEEP N/A 03/18/2022   Procedure: LOOP ELECTROSURGICAL EXCISION PROCEDURE (LEEP);  Surgeon: Catalina Antigua, MD;  Location: [facility];  Service: Gynecology;  Laterality: N/A;   VAGINAL HYSTERECTOMY N/A 12/16/2022   Procedure: TOTAL VAGINAL HYSTERECTOMY, BILATERAL SALPINGECTOMY;  Surgeon: Hermina Staggers, MD;  Location: [facility];  Service: Gynecology;   Laterality: N/A;   WISDOM TOOTH EXTRACTION  04/21/2009   Patient Active Problem List   Diagnosis Date Noted   DOE (dyspnea on exertion) 01/08/2023   Abnormal chest x-ray 01/08/2023   Weakness of both lower extremities 01/08/2023   Physical debility 01/08/2023   Post-operative state 01/07/2023   Cervical cancer (HCC) 12/29/2022   Pelvic pain 12/29/2022   Duodenal ulcer 12/24/2022   S/P hysterectomy 12/19/2022   Pelvic hematoma in female 12/16/2022   History of syncope 12/10/2022   Chronic pain of left knee 12/10/2022   Family history of colon cancer 10/10/2015    PCP: Mort Sawyers, FNP   REFERRING PROVIDER: Mort Sawyers, FNP   REFERRING DIAG:  R53.81 (ICD-10-CM) - Physical debility  R29.898 (ICD-10-CM) - Weakness of both lower extremities    THERAPY DIAG:  Muscle weakness (generalized)  Difficulty in walking, not elsewhere classified  Rationale for Evaluation and Treatment: Rehabilitation  ONSET DATE: 12/16/2022  SUBJECTIVE:   SUBJECTIVE STATEMENT: Pt was having abnormal papsmears and underwent hysterectomy on 12/16/2022.  Pt had a biopsy from surgery showing cervical cancer.  Pt went to ER on 8/30 due to vomiting and pain.  She was was admitted to hospital.  She has been in and out of the hospital from 8/30 to 9/12.  Pt had issues with bleeding/discharge, pain, and vomiting.  Pt had CT scan with no acute findings.  Pt was given a script for rolling walker for weakness at discharge.  Pt used the rolling walker for 2 weeks and has not used it recently.     Pt states her weakness is from being in the hospital bed lately having decreased activity.  Pt reports she still gets OOB with ambulation though is improving.  Pt states she occasionally has to use the wall when walking.  She has been using an incentive spirometer.  Pt reports having R ant hip pain with ambulation.  Pt is slow with initiating ambulation after standing up from a seated position.  Pt is limited with  household chores due to surgery.   Pt reports she is able to bend, but was told to be careful and bend slowly.  She is restricted to 20-30# of lifting.  She was also instructed to not reach to high with a combination of bending. Pt has not returned to work.   Pt is having a surgery on 10/23 to remove pelvic lymph nodes.  PERTINENT HISTORY: Hysterectomy on 12/16/22.  restricted to 20-30# lifting. Cervical cancer--Pt is having a surgery on 10/23 to remove pelvic lymph nodes. Migraines, PVD  PAIN:  Pt was having bilat ant hip pain and pelvic pain though now is focused more in R anterior hip.   0/10 pain currently.  Increased pain with leaning to R in sitting.  10/10 worst pain Increased pain with ambulation.    PRECAUTIONS: Other: avoid modalities due to cervical cancer    WEIGHT BEARING RESTRICTIONS: Pt states she is restricted to 20-30# lifting  FALLS:  Has patient fallen in last 6 months? No  LIVING ENVIRONMENT: Lives with:  kids, mom has been staying with her some since surgery   OCCUPATION: service desk analyst, security  PLOF: Independent  PATIENT GOALS: walk normally    OBJECTIVE:   DIAGNOSTIC FINDINGS:  CT scan with no acute findings per note in Epic. Biopsy showed cervical cancer.  PATIENT SURVEYS:  FOTO 46 with a goal of 66 at visit 11  COGNITION: Overall cognitive status: Within functional limits for tasks assessed       LOWER EXTREMITY MMT:  Strength not tested    FUNCTIONAL TESTS:  Pt able to stand without using Ue's.  Pain when sitting down.    TUG:  20 sec without AD GAIT: Assistive device utilized: None Level of assistance: Complete Independence Comments: Very slow gait.  Decreased Wb'ing thru R LE and favors R LE.  6/10 pain in ant hip.   TODAY'S TREATMENT:                                                                                                                                PATIENT EDUCATION:  Education details: objective  findings, dx, POC, rationale of interventions, and relevant anatomy.   Person educated: Patient Education method: Explanation Education comprehension: verbalized understanding  HOME EXERCISE PROGRAM: Will give next visit  ASSESSMENT:  CLINICAL IMPRESSION: Patient is a 37 y.o. female with dx's of physical debility and  muscle weakness in bilat LE's.  Pt has been in/out of the hospital since having a hysterectomy.  Pt has been dx'd with cervical CA.  She is limited with functional mobility and tolerance to activity. Pt reports she still gets OOB with ambulation though is improving.  She also has R ant hip pain with ambulation.  Pt is slow with initiating ambulation after standing up from a seated position.  She is limited with household chores due to surgery and has not returned to work.  It required pt increased time to perform TUG.  Pt is planning on having surgery in October which could affect PT.  Pt should benefit from skilled PT services to address impairments and improve overall function.     OBJECTIVE IMPAIRMENTS: Abnormal gait, decreased activity tolerance, decreased endurance, decreased mobility, difficulty walking, decreased strength, and pain.   ACTIVITY LIMITATIONS: carrying, lifting, standing, squatting, stairs, transfers, and locomotion level  PARTICIPATION LIMITATIONS: cleaning, laundry, shopping, and community activity  PERSONAL FACTORS: 1-2 comorbidities: cervical CA, PVD  are also affecting patient's functional outcome.   REHAB POTENTIAL: Good  CLINICAL DECISION MAKING: Stable/uncomplicated  EVALUATION COMPLEXITY: Low   GOALS:  SHORT TERM GOALS: Target date: 02/12/2023  Pt will be independent and compliant with HEP for improved tolerance to activity, mobility, strength, and pain.  Baseline: Goal status: INITIAL  2.  Pt will be able to initiate ambulation without hesitation when standing up from a seated position for improved ease of movement.  Baseline:  Goal  status: INITIAL Target date:  02/19/2023  3.  Pt will report at least a 25% improvement in tolerance with and performance of functional mobility.   Baseline:  Goal status: INITIAL    LONG TERM GOALS: Target date: 03/19/2023  Pt will perform TUG in no > than 10 sec for improved mobility and gait speed.  Baseline:  Goal status: INITIAL  2.  Pt will be able to perform transfers without pain.   Baseline:  Goal status: INITIAL  3.  Pt will report she is able to perform extended community ambulation without difficulty and without SOB.   Baseline:  Goal status: INITIAL  4.  Pt will report she is able to perform household chores as allowed by MD without significant difficulty  Baseline:  Goal status: INITIAL  5. Pt will ambulate with symmetrical Wb'ing in bilat LE's.   Goal status:  INITIAL     PLAN:  PT FREQUENCY:  1x/wk initially and progress to 2x/wk as appropriate  PT DURATION: 8 weeks  PLANNED INTERVENTIONS: Therapeutic exercises, Therapeutic activity, Neuromuscular re-education, Balance training, Gait training, Patient/Family education, Self Care, Stair training, DME instructions, Aquatic Therapy, and Re-evaluation  PLAN FOR NEXT SESSION: Cont with therapeutic exercise with considerations to recent surgery, limitations, and pt response.  Cont with functional mobility and gait training. Pt is having a surgery on 10/23 to remove pelvic lymph nodes.  Check all possible CPT codes: 65784 - PT Re-evaluation, 97110- Therapeutic Exercise, [phone]- Neuro Re-education, [phone] - Gait Training, [phone] - Therapeutic Activities, [phone] - Self Care, [phone] - Physical performance training, and [phone] - Aquatic therapy    Check all conditions that are expected to impact treatment: {Conditions expected to impact treatment:Complications related to surgery and Active major medical illness   If treatment provided at initial evaluation, no treatment charged due to lack of authorization.        Audie Clear III PT, DPT 01/25/23 6:18 PM

## 2023-01-23 ENCOUNTER — Encounter (HOSPITAL_BASED_OUTPATIENT_CLINIC_OR_DEPARTMENT_OTHER): Payer: Self-pay | Admitting: Physical Therapy

## 2023-01-23 ENCOUNTER — Other Ambulatory Visit: Payer: Self-pay

## 2023-01-28 ENCOUNTER — Encounter: Payer: Self-pay | Admitting: Obstetrics and Gynecology

## 2023-01-28 ENCOUNTER — Ambulatory Visit (HOSPITAL_BASED_OUTPATIENT_CLINIC_OR_DEPARTMENT_OTHER): Payer: 59 | Admitting: Physical Therapy

## 2023-01-28 ENCOUNTER — Other Ambulatory Visit (HOSPITAL_COMMUNITY)
Admission: RE | Admit: 2023-01-28 | Discharge: 2023-01-28 | Disposition: A | Discharge status: Home / Self Care | Payer: 59 | Source: Ambulatory Visit | Attending: Obstetrics and Gynecology | Admitting: Obstetrics and Gynecology

## 2023-01-28 ENCOUNTER — Ambulatory Visit (INDEPENDENT_AMBULATORY_CARE_PROVIDER_SITE_OTHER): Payer: 59 | Admitting: Obstetrics and Gynecology

## 2023-01-28 VITALS — BP 101/68 | HR 87 | Ht 70.0 in | Wt 218.0 lb

## 2023-01-28 DIAGNOSIS — N898 Other specified noninflammatory disorders of vagina: Secondary | ICD-10-CM | POA: Insufficient documentation

## 2023-01-28 DIAGNOSIS — R262 Difficulty in walking, not elsewhere classified: Secondary | ICD-10-CM

## 2023-01-28 DIAGNOSIS — M6281 Muscle weakness (generalized): Secondary | ICD-10-CM

## 2023-01-28 NOTE — Addendum Note (Signed)
Addended by: Hermina Staggers on: 01/28/2023 04:16 PM   Modules accepted: Orders

## 2023-01-28 NOTE — Therapy (Addendum)
 OUTPATIENT PHYSICAL THERAPY TREATMENT NOTE  Patient Name: Holly Brown MRN: [id number] DOB:[birth year], 37 y.o., female Today's Date: 01/29/2023  END OF SESSION:  PT End of Session - 01/28/23       Visit Number 2    Number of Visits 13     Date for PT Re-Evaluation 03/19/23     Authorization Type AETNA / MCD     PT Start Time 1109     PT Stop Time 1152     PT Time Calculation (min) 43 min     Activity Tolerance Patient tolerated treatment well     Behavior During Therapy WFL for tasks assessed/performed      Past Medical History:  Diagnosis Date   Gestational diabetes    metformin  - resolved after delivery   History of UTI    HSV (herpes simplex virus) infection    Migraines    Peripheral vascular disease (HCC)    Post-operative nausea and vomiting 12/19/2022   Seizures (HCC)    Jan.2016 - only had one seizure, none since   Vaginal Pap smear, abnormal    Past Surgical History:  Procedure Laterality Date   BIOPSY  12/24/2022   Procedure: BIOPSY;  Surgeon: Ace Holder, MD;  Location: [facility];  Service: Gastroenterology;;   DILATION AND CURETTAGE OF UTERUS     ESOPHAGOGASTRODUODENOSCOPY (EGD) WITH PROPOFOL  N/A 12/24/2022   Procedure: ESOPHAGOGASTRODUODENOSCOPY (EGD) WITH PROPOFOL ;  Surgeon: Ace Holder, MD;  Location: [facility];  Service: Gastroenterology;  Laterality: N/A;   LAPAROSCOPIC TUBAL LIGATION Bilateral 12/08/2018   Procedure: LAPAROSCOPIC TUBAL LIGATION;  Surgeon: Verlyn Goad, MD;  Location: [facility];  Service: Gynecology;  Laterality: Bilateral;   LEEP N/A 03/18/2022   Procedure: LOOP ELECTROSURGICAL EXCISION PROCEDURE (LEEP);  Surgeon: Verlyn Goad, MD;  Location: [facility];  Service: Gynecology;  Laterality: N/A;   VAGINAL HYSTERECTOMY N/A 12/16/2022   Procedure: TOTAL VAGINAL HYSTERECTOMY, BILATERAL SALPINGECTOMY;  Surgeon: Othelia Blinks, MD;  Location: [facility];  Service: Gynecology;  Laterality: N/A;   WISDOM  TOOTH EXTRACTION  04/21/2009   Patient Active Problem List   Diagnosis Date Noted   Vaginal discharge 01/28/2023   DOE (dyspnea on exertion) 01/08/2023   Abnormal chest x-ray 01/08/2023   Weakness of both lower extremities 01/08/2023   Physical debility 01/08/2023   Post-operative state 01/07/2023   Cervical cancer (HCC) 12/29/2022   Pelvic pain 12/29/2022   Duodenal ulcer 12/24/2022   S/P hysterectomy 12/19/2022   Pelvic hematoma in female 12/16/2022   History of syncope 12/10/2022   Chronic pain of left knee 12/10/2022   Family history of colon cancer 10/10/2015    PCP: Felicita Horns, FNP   REFERRING PROVIDER: Felicita Horns, FNP   REFERRING DIAG:  R53.81 (ICD-10-CM) - Physical debility  R29.898 (ICD-10-CM) - Weakness of both lower extremities    THERAPY DIAG:  Muscle weakness (generalized)  Difficulty in walking, not elsewhere classified  Rationale for Evaluation and Treatment: Rehabilitation  ONSET DATE: 12/16/2022  SUBJECTIVE:   SUBJECTIVE STATEMENT: Pt is 6 weeks and 1 day s/p hysterectomy.  Pt is having a surgery on 10/23 to remove pelvic lymph nodes.  Pt reports no increased pain after prior Rx and states she felt a little better.  Pt did some walking after prior Rx.  She walked to the top of her street and back which is about 5 houses down the street.  Pt states her street does have a low incline.  Pt states her hip is  feeling a little better; she is trying to not put as much pressure on it.    PERTINENT HISTORY: Hysterectomy on 12/16/22.  restricted to 20-30# lifting. Cervical cancer--Pt is having a surgery on 10/23 to remove pelvic lymph nodes. Migraines, PVD  PAIN:  Pt was having bilat ant hip pain and pelvic pain though now is focused more in R anterior hip.   0/10 pain currently.  Increased pain with leaning to R in sitting.  10/10 worst pain Increased pain with ambulation.    PRECAUTIONS: Other: avoid modalities due to cervical  cancer    WEIGHT BEARING RESTRICTIONS: Pt states she is restricted to 20-30# lifting  FALLS:  Has patient fallen in last 6 months? No  LIVING ENVIRONMENT: Lives with: kids, mom has been staying with her some since surgery   OCCUPATION: service desk analyst, security  PLOF: Independent  PATIENT GOALS: walk normally    OBJECTIVE:   DIAGNOSTIC FINDINGS:  CT scan with no acute findings per note in Epic. Biopsy showed cervical cancer.  PATIENT SURVEYS:  FOTO 46 with a goal of 66 at visit 11  COGNITION: Overall cognitive status: Within functional limits for tasks assessed       LOWER EXTREMITY MMT:  Strength: Hip abd:  R:  4/5, L:  5/5 Knee extension and flexion:  5/5 bilat  6 MWT:  Pt ambulated 780 ft.  She had a rest break at 4 mins.  HR:  99 which improved to 89, O2:  99%.  After test:  HR: 98 which improved to 76 with rest,  O2:  99%.  Pt reports 5/10 pain in R anterior hip   Pt performed: Supine bridge 2x10, 1x7-8 with TrA PT educated pt in correct performance and palpation of TrA contractions.  She performed TrA contractions in hooklying.  Standing heel raises 2x10 Standing marching x 8 reps Sidestepping x 2 laps  Hooklying bent knee fall out with limited ROM in R LE x 10 each  Pt received a HEP handout and was educated in correct form and appropriate frequency.  PT instructed pt she should not have pain with HEP.                 TODAY'S TREATMENT:                                                                                                                                PATIENT EDUCATION:  Education details: objective findings, HEP, exercise form, POC, rationale of interventions, and relevant anatomy.   Person educated: Patient Education method: Explanation, demonstration, verbal cues, handout. Education comprehension: verbalized understanding, returned demonstration, verbal cues required  HOME EXERCISE PROGRAM: Access Code: [email or web address] URL:  [email or web address] Date: 01/28/2023 Prepared by: Marnie Siren  Exercises - Heel Raises with Counter Support  - 1 x daily - 7 x weekly - 2-3 sets - 10 reps - Side Stepping with Counter Support  - 1 x daily - 7  x weekly - 2 sets - 10 reps  ASSESSMENT:  CLINICAL IMPRESSION: PT assessed strength and ambulation tolerance.  Pt had weakness in R hip abduction as evidenced by MMT.  Pt was fatigued with 6 MWT and was limited with ambulation distance.  She had to take a rest break at 4 mins.  Her HR increased with walking and appropriately decreased with rest.  PT educated pt in correct performance of TrA contraction and pt had difficulty with correct form.  Pt reports feeling a pressure with bridging which improved when performing a TrA contraction and decreasing the height of the bridge.  PT did not include bridge on HEP.  PT established HEP today and gave pt a handout.  Pt demonstrates good understanding.  Pt responded well to Rx having no c/o's and no increased pain after Rx.  Pt should benefit from skilled PT services to address impairments and improve overall function.     OBJECTIVE IMPAIRMENTS: Abnormal gait, decreased activity tolerance, decreased endurance, decreased mobility, difficulty walking, decreased strength, and pain.   ACTIVITY LIMITATIONS: carrying, lifting, standing, squatting, stairs, transfers, and locomotion level  PARTICIPATION LIMITATIONS: cleaning, laundry, shopping, and community activity  PERSONAL FACTORS: 1-2 comorbidities: cervical CA, PVD are also affecting patient's functional outcome.   REHAB POTENTIAL: Good  CLINICAL DECISION MAKING: Stable/uncomplicated  EVALUATION COMPLEXITY: Low   GOALS:  SHORT TERM GOALS: Target date: 02/12/2023  Pt will be independent and compliant with HEP for improved tolerance to activity, mobility, strength, and pain.  Baseline: Goal status: INITIAL  2.  Pt will be able to initiate ambulation without hesitation  when standing up from a seated position for improved ease of movement.  Baseline:  Goal status: INITIAL Target date:  02/19/2023  3.  Pt will report at least a 25% improvement in tolerance with and performance of functional mobility.   Baseline:  Goal status: INITIAL    LONG TERM GOALS: Target date: 03/19/2023  Pt will perform TUG in no > than 10 sec for improved mobility and gait speed.  Baseline:  Goal status: INITIAL  2.  Pt will be able to perform transfers without pain.   Baseline:  Goal status: INITIAL  3.  Pt will report she is able to perform extended community ambulation without difficulty and without SOB.   Baseline:  Goal status: INITIAL  4.  Pt will report she is able to perform household chores as allowed by MD without significant difficulty  Baseline:  Goal status: INITIAL  5. Pt will ambulate with symmetrical Wb'ing in bilat LE's.   Goal status:  INITIAL     PLAN:  PT FREQUENCY: 1x/wk initially and progress to 2x/wk as appropriate  PT DURATION: 8 weeks  PLANNED INTERVENTIONS: Therapeutic exercises, Therapeutic activity, Neuromuscular re-education, Balance training, Gait training, Patient/Family education, Self Care, Stair training, DME instructions, Aquatic Therapy, and Re-evaluation  PLAN FOR NEXT SESSION: Cont with therapeutic exercise with considerations to recent surgery, limitations, and pt response.  Cont with functional mobility and gait training. Pt is having a surgery on 10/23 to remove pelvic lymph nodes.         Trina Fujita III PT, DPT 01/29/23 8:44 PM  PHYSICAL THERAPY DISCHARGE SUMMARY  Visits from Start of Care: 2  Current functional level related to goals / functional outcomes: Unable to assess due to pt not being present at discharge and not returning to PT after 01/28/23.   Remaining deficits: See above   Education / Equipment: See above   Patient was  seen in PT from 01/22/23 - 01/28/23.  Pt did not schedule any further  PT after 10/9 appt.   Pt was planning to have surgery on 10/23 to remove pelvic lymph nodes.  Pt will be considered discharged from skilled PT at this time due to not scheduling any further PT.   Trina Fujita III PT, DPT 09/28/23 11:40 AM

## 2023-01-28 NOTE — Progress Notes (Signed)
37 y.o. GYN presents for vaginal discharge following TVH.

## 2023-01-28 NOTE — Progress Notes (Addendum)
Holly Brown continues with some vaginal discharge since her surgery. No bleeding. Completed a course of Metrogel a few weeks ago Denies any bowel or bladder dysfunction  PE AF VSS Chaperone present  Lungs clear Heart RRR Abd soft + BS GU NL EGBUS, scant white discharge noted, cuff almost completely healed  A/P Vaginal discharge Vag swab collected F/ U as per results

## 2023-01-29 ENCOUNTER — Encounter (HOSPITAL_BASED_OUTPATIENT_CLINIC_OR_DEPARTMENT_OTHER): Payer: Self-pay | Admitting: Physical Therapy

## 2023-01-29 LAB — CERVICOVAGINAL ANCILLARY ONLY
Bacterial Vaginitis (gardnerella): POSITIVE — AB
Candida Glabrata: NEGATIVE
Candida Vaginitis: NEGATIVE
Chlamydia: NEGATIVE
Comment: NEGATIVE
Comment: NEGATIVE
Comment: NEGATIVE
Comment: NEGATIVE
Comment: NEGATIVE
Comment: NORMAL
Neisseria Gonorrhea: NEGATIVE
Trichomonas: NEGATIVE

## 2023-02-05 NOTE — Patient Instructions (Signed)
DUE TO COVID-19 ONLY TWO VISITORS  (aged 35 and older)  ARE ALLOWED TO COME WITH YOU AND STAY IN THE WAITING ROOM ONLY DURING PRE OP AND PROCEDURE.   **NO VISITORS ARE ALLOWED IN THE SHORT STAY AREA OR RECOVERY ROOM!!**  IF YOU WILL BE ADMITTED INTO THE HOSPITAL YOU ARE ALLOWED ONLY FOUR SUPPORT PEOPLE DURING VISITATION HOURS ONLY (7 AM -8PM)   The support person(s) must pass our screening, gel in and out, and wear a mask at all times, including in the patient's room. Patients must also wear a mask when staff or their support person are in the room. Visitors GUEST BADGE MUST BE WORN VISIBLY  One adult visitor may remain with you overnight and MUST be in the room by 8 P.M.     Your procedure is scheduled on: 02/11/23   Report to [facility] Main Entrance    Report to admitting at : 11:15 AM   Call this number if you have problems the morning of surgery [phone]   Eat a light diet the day before surgery.  Examples including soups, broths, toast, yogurt, mashed potatoes.  Things to avoid include carbonated beverages (fizzy beverages), raw fruits and raw vegetables, or beans.   If your bowels are filled with gas, your surgeon will have difficulty visualizing your pelvic organs which increases your surgical risks. Do not eat food :After Midnight.   After Midnight you may have the following liquids until : 10:30 AM DAY OF SURGERY  Water Black Coffee (sugar ok, NO MILK/CREAM OR CREAMERS)  Tea (sugar ok, NO MILK/CREAM OR CREAMERS) regular and decaf                             Plain Jell-O (NO RED)                                           Fruit ices (not with fruit pulp, NO RED)                                     Popsicles (NO RED)                                                                  Juice: apple, WHITE grape, WHITE cranberry Sports drinks like Gatorade (NO RED)              FOLLOW ANY ADDITIONAL PRE OP INSTRUCTIONS YOU RECEIVED FROM YOUR SURGEON'S OFFICE!!!    Oral Hygiene is also important to reduce your risk of infection.                                    Remember - BRUSH YOUR TEETH THE MORNING OF SURGERY WITH YOUR REGULAR TOOTHPASTE  DENTURES WILL BE REMOVED PRIOR TO SURGERY PLEASE DO NOT APPLY "Poly grip" OR ADHESIVES!!!   Do NOT smoke after Midnight   Take these medicines the morning of surgery with A SIP OF WATER:  pantoprazole.                            You may not have any metal on your body including hair pins, jewelry, and body piercing             Do not wear make-up, lotions, powders, perfumes/cologne, or deodorant  Do not wear nail polish including gel and S&S, artificial/acrylic nails, or any other type of covering on natural nails including finger and toenails. If you have artificial nails, gel coating, etc. that needs to be removed by a nail salon please have this removed prior to surgery or surgery may need to be canceled/ delayed if the surgeon/ anesthesia feels like they are unable to be safely monitored.   Do not shave  48 hours prior to surgery.    Do not bring valuables to the hospital. [facility] IS NOT             RESPONSIBLE   FOR VALUABLES.   Contacts, glasses, or bridgework may not be worn into surgery.   Bring small overnight bag day of surgery.   DO NOT BRING YOUR HOME MEDICATIONS TO THE HOSPITAL. PHARMACY WILL DISPENSE MEDICATIONS LISTED ON YOUR MEDICATION LIST TO YOU DURING YOUR ADMISSION IN THE HOSPITAL!    Patients discharged on the day of surgery will not be allowed to drive home.  Someone NEEDS to stay with you for the first 24 hours after anesthesia.   Special Instructions: Bring a copy of your healthcare power of attorney and living will documents         the day of surgery if you haven't scanned them before.              Please read over the following fact sheets you were given: IF YOU HAVE QUESTIONS ABOUT YOUR PRE-OP INSTRUCTIONS PLEASE CALL [phone]    [facility] - Preparing for  Surgery Before surgery, you can play an important role.  Because skin is not sterile, your skin needs to be as free of germs as possible.  You can reduce the number of germs on your skin by washing with CHG (chlorahexidine gluconate) soap before surgery.  CHG is an antiseptic cleaner which kills germs and bonds with the skin to continue killing germs even after washing. Please DO NOT use if you have an allergy to CHG or antibacterial soaps.  If your skin becomes reddened/irritated stop using the CHG and inform your nurse when you arrive at Short Stay. Do not shave (including legs and underarms) for at least 48 hours prior to the first CHG shower.  You may shave your face/neck. Please follow these instructions carefully:  1.  Shower with CHG Soap the night before surgery and the  morning of Surgery.  2.  If you choose to wash your hair, wash your hair first as usual with your  normal  shampoo.  3.  After you shampoo, rinse your hair and body thoroughly to remove the  shampoo.                           4.  Use CHG as you would any other liquid soap.  You can apply chg directly  to the skin and wash                       Gently with a scrungie or clean washcloth.  5.  Apply the CHG Soap to your body ONLY FROM THE NECK DOWN.   Do not use on face/ open                           Wound or open sores. Avoid contact with eyes, ears mouth and genitals (private parts).                       Wash face,  Genitals (private parts) with your normal soap.             6.  Wash thoroughly, paying special attention to the area where your surgery  will be performed.  7.  Thoroughly rinse your body with warm water from the neck down.  8.  DO NOT shower/wash with your normal soap after using and rinsing off  the CHG Soap.                9.  Pat yourself dry with a clean towel.            10.  Wear clean pajamas.            11.  Place clean sheets on your bed the night of your first shower and do not  sleep with pets. Day  of Surgery : Do not apply any lotions/deodorants the morning of surgery.  Please wear clean clothes to the hospital/surgery center.  FAILURE TO FOLLOW THESE INSTRUCTIONS MAY RESULT IN THE CANCELLATION OF YOUR SURGERY PATIENT SIGNATURE_________________________________  NURSE SIGNATURE__________________________________  ________________________________________________________________________ WHAT IS A BLOOD TRANSFUSION? Blood Transfusion Information  A transfusion is the replacement of blood or some of its parts. Blood is made up of multiple cells which provide different functions. Red blood cells carry oxygen and are used for blood loss replacement. White blood cells fight against infection. Platelets control bleeding. Plasma helps clot blood. Other blood products are available for specialized needs, such as hemophilia or other clotting disorders. BEFORE THE TRANSFUSION  Who gives blood for transfusions?  Healthy volunteers who are fully evaluated to make sure their blood is safe. This is blood bank blood. Transfusion therapy is the safest it has ever been in the practice of medicine. Before blood is taken from a donor, a complete history is taken to make sure that person has no history of diseases nor engages in risky social behavior (examples are intravenous drug use or sexual activity with multiple partners). The donor's travel history is screened to minimize risk of transmitting infections, such as malaria. The donated blood is tested for signs of infectious diseases, such as HIV and hepatitis. The blood is then tested to be sure it is compatible with you in order to minimize the chance of a transfusion reaction. If you or a relative donates blood, this is often done in anticipation of surgery and is not appropriate for emergency situations. It takes many days to process the donated blood. RISKS AND COMPLICATIONS Although transfusion therapy is very safe and saves many lives, the main  dangers of transfusion include:  Getting an infectious disease. Developing a transfusion reaction. This is an allergic reaction to something in the blood you were given. Every precaution is taken to prevent this. The decision to have a blood transfusion has been considered carefully by your caregiver before blood is given. Blood is not given unless the benefits outweigh the risks. AFTER THE TRANSFUSION Right after receiving a blood transfusion, you will usually feel much  better and more energetic. This is especially true if your red blood cells have gotten low (anemic). The transfusion raises the level of the red blood cells which carry oxygen, and this usually causes an energy increase. The nurse administering the transfusion will monitor you carefully for complications. HOME CARE INSTRUCTIONS  No special instructions are needed after a transfusion. You may find your energy is better. Speak with your caregiver about any limitations on activity for underlying diseases you may have. SEEK MEDICAL CARE IF:  Your condition is not improving after your transfusion. You develop redness or irritation at the intravenous (IV) site. SEEK IMMEDIATE MEDICAL CARE IF:  Any of the following symptoms occur over the next 12 hours: Shaking chills. You have a temperature by mouth above 102 F (38.9 C), not controlled by medicine. Chest, back, or muscle pain. People around you feel you are not acting correctly or are confused. Shortness of breath or difficulty breathing. Dizziness and fainting. You get a rash or develop hives. You have a decrease in urine output. Your urine turns a dark color or changes to pink, red, or brown. Any of the following symptoms occur over the next 10 days: You have a temperature by mouth above 102 F (38.9 C), not controlled by medicine. Shortness of breath. Weakness after normal activity. The white part of the eye turns yellow (jaundice). You have a decrease in the amount of  urine or are urinating less often. Your urine turns a dark color or changes to pink, red, or brown. Document Released: 04/04/2000 Document Revised: 06/30/2011 Document Reviewed: 11/22/2007 [facility] Patient Information 2014 [place], [place].  _______________________________________________________________________

## 2023-02-06 ENCOUNTER — Encounter (HOSPITAL_COMMUNITY): Payer: Self-pay

## 2023-02-06 ENCOUNTER — Other Ambulatory Visit: Payer: Self-pay

## 2023-02-06 ENCOUNTER — Encounter (HOSPITAL_COMMUNITY)
Admission: RE | Admit: 2023-02-06 | Discharge: 2023-02-06 | Disposition: A | Discharge status: Home / Self Care | Payer: Medicaid Other | Source: Ambulatory Visit | Attending: Gynecologic Oncology | Admitting: Gynecologic Oncology

## 2023-02-06 DIAGNOSIS — Z01812 Encounter for preprocedural laboratory examination: Secondary | ICD-10-CM | POA: Insufficient documentation

## 2023-02-06 DIAGNOSIS — C539 Malignant neoplasm of cervix uteri, unspecified: Secondary | ICD-10-CM | POA: Diagnosis not present

## 2023-02-06 DIAGNOSIS — Z01818 Encounter for other preprocedural examination: Secondary | ICD-10-CM | POA: Diagnosis present

## 2023-02-06 LAB — CBC
HCT: 37.7 % (ref 36.0–46.0)
Hemoglobin: 11.7 g/dL — ABNORMAL LOW (ref 12.0–15.0)
MCH: 29.8 pg (ref 26.0–34.0)
MCHC: 31 g/dL (ref 30.0–36.0)
MCV: 96.2 fL (ref 80.0–100.0)
Platelets: 374 10*3/uL (ref 150–400)
RBC: 3.92 MIL/uL (ref 3.87–5.11)
RDW: 13.6 % (ref 11.5–15.5)
WBC: 6.2 10*3/uL (ref 4.0–10.5)
nRBC: 0 % (ref 0.0–0.2)

## 2023-02-06 LAB — COMPREHENSIVE METABOLIC PANEL
ALT: 20 U/L (ref 0–44)
AST: 16 U/L (ref 15–41)
Albumin: 3.5 g/dL (ref 3.5–5.0)
Alkaline Phosphatase: 58 U/L (ref 38–126)
Anion gap: 9 (ref 5–15)
BUN: 9 mg/dL (ref 6–20)
CO2: 23 mmol/L (ref 22–32)
Calcium: 9.1 mg/dL (ref 8.9–10.3)
Chloride: 110 mmol/L (ref 98–111)
Creatinine, Ser: 0.9 mg/dL (ref 0.44–1.00)
GFR, Estimated: >60 mL/min (ref >60)
Glucose, Bld: 87 mg/dL (ref 70–99)
Potassium: 3.5 mmol/L (ref 3.5–5.1)
Sodium: 142 mmol/L (ref 135–145)
Total Bilirubin: 1 mg/dL (ref 0.3–1.2)
Total Protein: 6.6 g/dL (ref 6.5–8.1)

## 2023-02-06 NOTE — Progress Notes (Signed)
For Short Stay: COVID SWAB appointment date:  Bowel Prep reminder:   For Anesthesia: PCP - Mort Sawyers, FNP  Cardiologist - N/A  Chest x-ray -  EKG - 01/13/23 Stress Test -  ECHO - 07/06/14 Cardiac Cath -  Pacemaker/ICD device last checked: Pacemaker orders received: Device Rep notified:  Spinal Cord Stimulator: N/A  Sleep Study - N/A CPAP -   Fasting Blood Sugar - N/A Checks Blood Sugar _____ times a day Date and result of last Hgb A1c-  Last dose of GLP1 agonist- N/A GLP1 instructions:   Last dose of SGLT-2 inhibitors- N/A SGLT-2 instructions:   Blood Thinner Instructions:N/A Aspirin Instructions: Last Dose:  Activity level: Can go up a flight of stairs and activities of daily living without stopping and without chest pain and/or shortness of breath   Able to exercise without chest pain and/or shortness of breath  Anesthesia review: Hx: Seizures(2016 once),PVD.  Patient denies shortness of breath, fever, cough and chest pain at PAT appointment   Patient verbalized understanding of instructions that were given to them at the PAT appointment. Patient was also instructed that they will need to review over the PAT instructions again at home before surgery.

## 2023-02-06 NOTE — Plan of Care (Signed)
CHL Tonsillectomy/Adenoidectomy, Postoperative PEDS care plan entered in error.

## 2023-02-10 ENCOUNTER — Telehealth: Payer: Self-pay

## 2023-02-10 ENCOUNTER — Other Ambulatory Visit: Payer: Self-pay | Admitting: Gynecologic Oncology

## 2023-02-10 ENCOUNTER — Other Ambulatory Visit (HOSPITAL_COMMUNITY): Payer: Self-pay

## 2023-02-10 DIAGNOSIS — C539 Malignant neoplasm of cervix uteri, unspecified: Secondary | ICD-10-CM

## 2023-02-10 DIAGNOSIS — G8918 Other acute postprocedural pain: Secondary | ICD-10-CM

## 2023-02-10 MED ORDER — OXYCODONE HCL 5 MG PO TABS
5.0000 mg | ORAL_TABLET | ORAL | 0 refills | Status: DC | PRN
  Filled 2023-02-10: qty 15, 3d supply, fill #0

## 2023-02-10 MED ORDER — IBUPROFEN 800 MG PO TABS
800.0000 mg | ORAL_TABLET | Freq: Three times a day (TID) | ORAL | 1 refills | Status: DC | PRN
  Filled 2023-02-10: qty 30, 10d supply, fill #0

## 2023-02-10 MED ORDER — SENNA-DOCUSATE SODIUM 8.6-50 MG PO TABS
2.0000 | ORAL_TABLET | Freq: Every day | ORAL | 0 refills | Status: DC
  Filled 2023-02-10: qty 30, 15d supply, fill #0

## 2023-02-10 NOTE — Telephone Encounter (Signed)
[employer] forms received, forms completed and faxed back.   Pt is aware.

## 2023-02-10 NOTE — Progress Notes (Signed)
Post-op meds sent in pre-operatively.

## 2023-02-10 NOTE — Telephone Encounter (Signed)
Telephone call to check on pre-operative status.  Patient compliant with pre-operative instructions.  Reinforced nothing to eat after midnight. Clear liquids until 1030. Patient to arrive at 1115.  No questions or concerns voiced.  Instructed to call for any needs.

## 2023-02-11 ENCOUNTER — Ambulatory Visit (HOSPITAL_COMMUNITY)
Admission: RE | Admit: 2023-02-11 | Discharge: 2023-02-11 | Disposition: A | Discharge status: Home / Self Care | Payer: Medicaid Other | Attending: Gynecologic Oncology | Admitting: Gynecologic Oncology

## 2023-02-11 ENCOUNTER — Other Ambulatory Visit: Payer: Self-pay

## 2023-02-11 ENCOUNTER — Ambulatory Visit (HOSPITAL_COMMUNITY): Payer: Medicaid Other | Admitting: Anesthesiology

## 2023-02-11 ENCOUNTER — Encounter (HOSPITAL_COMMUNITY): Payer: Self-pay | Admitting: Gynecologic Oncology

## 2023-02-11 ENCOUNTER — Encounter (HOSPITAL_COMMUNITY)
Admission: RE | Disposition: A | Discharge status: Home / Self Care | Payer: Self-pay | Source: Home / Self Care | Attending: Gynecologic Oncology

## 2023-02-11 ENCOUNTER — Ambulatory Visit (HOSPITAL_BASED_OUTPATIENT_CLINIC_OR_DEPARTMENT_OTHER): Payer: Medicaid Other | Admitting: Anesthesiology

## 2023-02-11 DIAGNOSIS — Z79899 Other long term (current) drug therapy: Secondary | ICD-10-CM | POA: Diagnosis not present

## 2023-02-11 DIAGNOSIS — K219 Gastro-esophageal reflux disease without esophagitis: Secondary | ICD-10-CM | POA: Insufficient documentation

## 2023-02-11 DIAGNOSIS — Z87891 Personal history of nicotine dependence: Secondary | ICD-10-CM | POA: Insufficient documentation

## 2023-02-11 DIAGNOSIS — C539 Malignant neoplasm of cervix uteri, unspecified: Secondary | ICD-10-CM | POA: Insufficient documentation

## 2023-02-11 DIAGNOSIS — C530 Malignant neoplasm of endocervix: Secondary | ICD-10-CM | POA: Diagnosis not present

## 2023-02-11 DIAGNOSIS — N736 Female pelvic peritoneal adhesions (postinfective): Secondary | ICD-10-CM | POA: Insufficient documentation

## 2023-02-11 DIAGNOSIS — K5900 Constipation, unspecified: Secondary | ICD-10-CM | POA: Insufficient documentation

## 2023-02-11 DIAGNOSIS — Z90710 Acquired absence of both cervix and uterus: Secondary | ICD-10-CM | POA: Diagnosis not present

## 2023-02-11 DIAGNOSIS — G40909 Epilepsy, unspecified, not intractable, without status epilepticus: Secondary | ICD-10-CM | POA: Diagnosis not present

## 2023-02-11 LAB — TYPE AND SCREEN
ABO/RH(D): A POS
Antibody Screen: NEGATIVE

## 2023-02-11 SURGERY — LYMPHADENECTOMY, PELVIS, ROBOT-ASSISTED
Anesthesia: General

## 2023-02-11 MED ORDER — ACETAMINOPHEN 10 MG/ML IV SOLN
1000.0000 mg | Freq: Once | INTRAVENOUS | Status: AC
  Administered 2023-02-11: 1000 mg via INTRAVENOUS

## 2023-02-11 MED ORDER — ONDANSETRON HCL 4 MG/2ML IJ SOLN
INTRAMUSCULAR | Status: DC | PRN
  Administered 2023-02-11: 4 mg via INTRAVENOUS

## 2023-02-11 MED ORDER — PROPOFOL 500 MG/50ML IV EMUL
INTRAVENOUS | Status: DC | PRN
  Administered 2023-02-11: 25 ug/kg/min via INTRAVENOUS

## 2023-02-11 MED ORDER — HYDROMORPHONE HCL 1 MG/ML IJ SOLN: 0.5000 mg | INTRAMUSCULAR | Status: DC | PRN

## 2023-02-11 MED ORDER — ONDANSETRON HCL 4 MG/2ML IJ SOLN
INTRAMUSCULAR | Status: AC
  Filled 2023-02-11: qty 2

## 2023-02-11 MED ORDER — CEFAZOLIN SODIUM-DEXTROSE 2-4 GM/100ML-% IV SOLN
2.0000 g | INTRAVENOUS | Status: AC
  Administered 2023-02-11: 2 g via INTRAVENOUS
  Filled 2023-02-11: qty 100

## 2023-02-11 MED ORDER — DEXAMETHASONE SODIUM PHOSPHATE 10 MG/ML IJ SOLN
INTRAMUSCULAR | Status: AC
  Filled 2023-02-11: qty 1

## 2023-02-11 MED ORDER — ORAL CARE MOUTH RINSE: 15.0000 mL | Freq: Once | OROMUCOSAL | Status: AC

## 2023-02-11 MED ORDER — ONDANSETRON HCL 4 MG/2ML IJ SOLN: 4.0000 mg | Freq: Once | INTRAMUSCULAR | Status: DC | PRN

## 2023-02-11 MED ORDER — OXYCODONE HCL 5 MG PO TABS
ORAL_TABLET | ORAL | Status: AC
  Filled 2023-02-11: qty 2

## 2023-02-11 MED ORDER — FENTANYL CITRATE PF 50 MCG/ML IJ SOSY
PREFILLED_SYRINGE | INTRAMUSCULAR | Status: AC
  Filled 2023-02-11: qty 1

## 2023-02-11 MED ORDER — ACETAMINOPHEN 10 MG/ML IV SOLN
INTRAVENOUS | Status: AC
  Filled 2023-02-11: qty 100

## 2023-02-11 MED ORDER — KETAMINE HCL 10 MG/ML IJ SOLN
INTRAMUSCULAR | Status: DC | PRN
  Administered 2023-02-11: 15 mg via INTRAVENOUS
  Administered 2023-02-11: 35 mg via INTRAVENOUS

## 2023-02-11 MED ORDER — CHLORHEXIDINE GLUCONATE 0.12 % MT SOLN
15.0000 mL | Freq: Once | OROMUCOSAL | Status: AC
  Administered 2023-02-11: 15 mL via OROMUCOSAL

## 2023-02-11 MED ORDER — OXYCODONE HCL 5 MG PO TABS
10.0000 mg | ORAL_TABLET | Freq: Once | ORAL | Status: AC
  Administered 2023-02-11: 10 mg via ORAL

## 2023-02-11 MED ORDER — KETOROLAC TROMETHAMINE 30 MG/ML IJ SOLN
30.0000 mg | Freq: Once | INTRAMUSCULAR | Status: AC
  Administered 2023-02-11: 30 mg via INTRAVENOUS

## 2023-02-11 MED ORDER — ROCURONIUM BROMIDE 10 MG/ML (PF) SYRINGE
PREFILLED_SYRINGE | INTRAVENOUS | Status: AC
  Filled 2023-02-11: qty 10

## 2023-02-11 MED ORDER — MIDAZOLAM HCL 2 MG/2ML IJ SOLN
INTRAMUSCULAR | Status: AC
  Filled 2023-02-11: qty 2

## 2023-02-11 MED ORDER — PROPOFOL 500 MG/50ML IV EMUL
INTRAVENOUS | Status: AC
  Filled 2023-02-11: qty 50

## 2023-02-11 MED ORDER — PHENYLEPHRINE HCL-NACL 20-0.9 MG/250ML-% IV SOLN
INTRAVENOUS | Status: AC
  Filled 2023-02-11: qty 250

## 2023-02-11 MED ORDER — LIDOCAINE HCL (PF) 2 % IJ SOLN
INTRAMUSCULAR | Status: DC | PRN
  Administered 2023-02-11: 1.5 mg/kg/h via INTRADERMAL

## 2023-02-11 MED ORDER — LIDOCAINE HCL (CARDIAC) PF 100 MG/5ML IV SOSY
PREFILLED_SYRINGE | INTRAVENOUS | Status: DC | PRN
  Administered 2023-02-11: 160 mg via INTRAVENOUS

## 2023-02-11 MED ORDER — ACETAMINOPHEN 500 MG PO TABS
1000.0000 mg | ORAL_TABLET | ORAL | Status: AC
  Administered 2023-02-11: 1000 mg via ORAL
  Filled 2023-02-11: qty 2

## 2023-02-11 MED ORDER — BUPIVACAINE HCL 0.25 % IJ SOLN
INTRAMUSCULAR | Status: AC
  Filled 2023-02-11: qty 1

## 2023-02-11 MED ORDER — KETAMINE HCL 50 MG/5ML IJ SOSY
PREFILLED_SYRINGE | INTRAMUSCULAR | Status: AC
  Filled 2023-02-11: qty 5

## 2023-02-11 MED ORDER — FENTANYL CITRATE (PF) 250 MCG/5ML IJ SOLN
INTRAMUSCULAR | Status: AC
  Filled 2023-02-11: qty 5

## 2023-02-11 MED ORDER — ROCURONIUM BROMIDE 100 MG/10ML IV SOLN
INTRAVENOUS | Status: DC | PRN
  Administered 2023-02-11: 20 mg via INTRAVENOUS
  Administered 2023-02-11: 50 mg via INTRAVENOUS

## 2023-02-11 MED ORDER — SCOPOLAMINE 1 MG/3DAYS TD PT72
1.0000 | MEDICATED_PATCH | TRANSDERMAL | Status: DC
  Administered 2023-02-11: 1.5 mg via TRANSDERMAL
  Filled 2023-02-11: qty 1

## 2023-02-11 MED ORDER — PHENYLEPHRINE HCL-NACL 20-0.9 MG/250ML-% IV SOLN
INTRAVENOUS | Status: DC | PRN
Start: 2023-02-11 — End: 2023-02-11
  Administered 2023-02-11: 30 ug/min via INTRAVENOUS

## 2023-02-11 MED ORDER — HEPARIN SODIUM (PORCINE) 5000 UNIT/ML IJ SOLN
5000.0000 [IU] | INTRAMUSCULAR | Status: AC
  Administered 2023-02-11: 5000 [IU] via SUBCUTANEOUS
  Filled 2023-02-11: qty 1

## 2023-02-11 MED ORDER — FENTANYL CITRATE (PF) 100 MCG/2ML IJ SOLN
INTRAMUSCULAR | Status: DC | PRN
  Administered 2023-02-11: 100 ug via INTRAVENOUS
  Administered 2023-02-11 (×3): 50 ug via INTRAVENOUS

## 2023-02-11 MED ORDER — PROPOFOL 10 MG/ML IV BOLUS
INTRAVENOUS | Status: DC | PRN
  Administered 2023-02-11: 160 mg via INTRAVENOUS

## 2023-02-11 MED ORDER — SUGAMMADEX SODIUM 200 MG/2ML IV SOLN
INTRAVENOUS | Status: DC | PRN
  Administered 2023-02-11 (×2): 200 mg via INTRAVENOUS

## 2023-02-11 MED ORDER — KETOROLAC TROMETHAMINE 30 MG/ML IJ SOLN
INTRAMUSCULAR | Status: AC
  Filled 2023-02-11: qty 1

## 2023-02-11 MED ORDER — SODIUM CHLORIDE 0.9% FLUSH: 3.0000 mL | Freq: Two times a day (BID) | INTRAVENOUS | Status: DC

## 2023-02-11 MED ORDER — GABAPENTIN 300 MG PO CAPS
300.0000 mg | ORAL_CAPSULE | ORAL | Status: AC
  Administered 2023-02-11: 300 mg via ORAL
  Filled 2023-02-11: qty 1

## 2023-02-11 MED ORDER — FENTANYL CITRATE PF 50 MCG/ML IJ SOSY
25.0000 ug | PREFILLED_SYRINGE | INTRAMUSCULAR | Status: DC | PRN
  Administered 2023-02-11 (×3): 50 ug via INTRAVENOUS

## 2023-02-11 MED ORDER — LACTATED RINGERS IV SOLN: INTRAVENOUS | Status: DC

## 2023-02-11 MED ORDER — MIDAZOLAM HCL 5 MG/5ML IJ SOLN
INTRAMUSCULAR | Status: DC | PRN
  Administered 2023-02-11: 2 mg via INTRAVENOUS

## 2023-02-11 MED ORDER — AMISULPRIDE (ANTIEMETIC) 5 MG/2ML IV SOLN: 10.0000 mg | Freq: Once | INTRAVENOUS | Status: DC | PRN

## 2023-02-11 MED ORDER — BUPIVACAINE HCL 0.25 % IJ SOLN
INTRAMUSCULAR | Status: DC | PRN
  Administered 2023-02-11: 32 mL

## 2023-02-11 MED ORDER — LACTATED RINGERS IR SOLN
Status: DC | PRN
  Administered 2023-02-11: 1000 mL

## 2023-02-11 MED ORDER — DEXAMETHASONE SODIUM PHOSPHATE 4 MG/ML IJ SOLN
4.0000 mg | INTRAMUSCULAR | Status: AC
  Administered 2023-02-11: 10 mg via INTRAVENOUS

## 2023-02-11 MED ORDER — FENTANYL CITRATE PF 50 MCG/ML IJ SOSY
PREFILLED_SYRINGE | INTRAMUSCULAR | Status: AC
  Filled 2023-02-11: qty 2

## 2023-02-11 MED ORDER — PROPOFOL 10 MG/ML IV BOLUS
INTRAVENOUS | Status: AC
  Filled 2023-02-11: qty 20

## 2023-02-11 MED ORDER — LIDOCAINE HCL (PF) 2 % IJ SOLN
INTRAMUSCULAR | Status: AC
  Filled 2023-02-11: qty 5

## 2023-02-11 SURGICAL SUPPLY — 77 items
ADH SKN CLS APL DERMABOND .7 (GAUZE/BANDAGES/DRESSINGS) ×1
AGENT HMST KT MTR STRL THRMB (HEMOSTASIS)
APL ESCP 34 STRL LF DISP (HEMOSTASIS)
APPLICATOR SURGIFLO ENDO (HEMOSTASIS) IMPLANT
BAG COUNTER SPONGE SURGICOUNT (BAG) IMPLANT
BAG LAPAROSCOPIC 12 15 PORT 16 (BASKET) IMPLANT
BAG RETRIEVAL 12/15 (BASKET) ×2
BAG SPNG CNTER NS LX DISP (BAG)
BLADE SURG SZ10 CARB STEEL (BLADE) IMPLANT
COVER BACK TABLE 60X90IN (DRAPES) ×2 IMPLANT
COVER TIP SHEARS 8 DVNC (MISCELLANEOUS) ×2 IMPLANT
DERMABOND ADVANCED .7 DNX12 (GAUZE/BANDAGES/DRESSINGS) ×2 IMPLANT
DRAPE ARM DVNC X/XI (DISPOSABLE) ×8 IMPLANT
DRAPE COLUMN DVNC XI (DISPOSABLE) ×2 IMPLANT
DRAPE SHEET LG 3/4 BI-LAMINATE (DRAPES) ×2 IMPLANT
DRAPE SURG IRRIG POUCH 19X23 (DRAPES) ×2 IMPLANT
DRIVER NDL MEGA SUTCUT DVNCXI (INSTRUMENTS) ×2 IMPLANT
DRIVER NDLE MEGA SUTCUT DVNCXI (INSTRUMENTS) ×1 IMPLANT
DRSG OPSITE POSTOP 4X6 (GAUZE/BANDAGES/DRESSINGS) IMPLANT
DRSG OPSITE POSTOP 4X8 (GAUZE/BANDAGES/DRESSINGS) IMPLANT
ELECT PENCIL ROCKER SW 15FT (MISCELLANEOUS) IMPLANT
ELECT REM PT RETURN 15FT ADLT (MISCELLANEOUS) ×2 IMPLANT
FORCEPS BPLR FENES DVNC XI (FORCEP) ×2 IMPLANT
FORCEPS PROGRASP DVNC XI (FORCEP) ×2 IMPLANT
GAUZE 4X4 16PLY ~~LOC~~+RFID DBL (SPONGE) ×4 IMPLANT
GLOVE BIO SURGEON STRL SZ 6 (GLOVE) ×8 IMPLANT
GLOVE BIO SURGEON STRL SZ 6.5 (GLOVE) ×2 IMPLANT
GOWN STRL REUS W/ TWL LRG LVL3 (GOWN DISPOSABLE) ×8 IMPLANT
GOWN STRL REUS W/TWL LRG LVL3 (GOWN DISPOSABLE) ×4
GRASPER SUT TROCAR 14GX15 (MISCELLANEOUS) IMPLANT
HOLDER FOLEY CATH W/STRAP (MISCELLANEOUS) IMPLANT
IRRIG SUCT STRYKERFLOW 2 WTIP (MISCELLANEOUS) ×1
IRRIGATION SUCT STRKRFLW 2 WTP (MISCELLANEOUS) ×2 IMPLANT
KIT PROCEDURE DVNC SI (MISCELLANEOUS) IMPLANT
KIT TURNOVER KIT A (KITS) IMPLANT
LIGASURE IMPACT 36 18CM CVD LR (INSTRUMENTS) IMPLANT
MANIPULATOR ADVINCU DEL 3.0 PL (MISCELLANEOUS) IMPLANT
MANIPULATOR ADVINCU DEL 3.5 PL (MISCELLANEOUS) IMPLANT
MANIPULATOR UTERINE 4.5 ZUMI (MISCELLANEOUS) IMPLANT
NDL HYPO 21X1.5 SAFETY (NEEDLE) ×2 IMPLANT
NDL SPNL 18GX3.5 QUINCKE PK (NEEDLE) IMPLANT
NEEDLE HYPO 21X1.5 SAFETY (NEEDLE) ×1 IMPLANT
NEEDLE SPNL 18GX3.5 QUINCKE PK (NEEDLE) IMPLANT
OBTURATOR OPTICAL STND 8 DVNC (TROCAR) ×1
OBTURATOR OPTICALSTD 8 DVNC (TROCAR) ×2 IMPLANT
PACK ROBOT GYN CUSTOM WL (TRAY / TRAY PROCEDURE) ×2 IMPLANT
PAD POSITIONING PINK XL (MISCELLANEOUS) ×2 IMPLANT
PORT ACCESS TROCAR AIRSEAL 12 (TROCAR) IMPLANT
SCISSORS MNPLR CVD DVNC XI (INSTRUMENTS) ×2 IMPLANT
SCRUB CHG 4% DYNA-HEX 4OZ (MISCELLANEOUS) IMPLANT
SEAL UNIV 5-12 XI (MISCELLANEOUS) ×8 IMPLANT
SET TRI-LUMEN FLTR TB AIRSEAL (TUBING) ×2 IMPLANT
SPIKE FLUID TRANSFER (MISCELLANEOUS) ×2 IMPLANT
SPONGE T-LAP 18X18 ~~LOC~~+RFID (SPONGE) IMPLANT
SURGIFLO W/THROMBIN 8M KIT (HEMOSTASIS) IMPLANT
SUT MNCRL AB 4-0 PS2 18 (SUTURE) IMPLANT
SUT PDS AB 1 TP1 96 (SUTURE) IMPLANT
SUT V-LOC 180 0-0 GS22 (SUTURE) IMPLANT
SUT VIC AB 0 CT1 27 (SUTURE)
SUT VIC AB 0 CT1 27XBRD ANTBC (SUTURE) IMPLANT
SUT VIC AB 2-0 CT1 27 (SUTURE)
SUT VIC AB 2-0 CT1 TAPERPNT 27 (SUTURE) IMPLANT
SUT VIC AB 4-0 PS2 18 (SUTURE) ×4 IMPLANT
SUT VICRYL 0 27 CT2 27 ABS (SUTURE) ×2 IMPLANT
SUT VLOC 180 0 9IN GS21 (SUTURE) IMPLANT
SYR 10ML LL (SYRINGE) IMPLANT
SYS BAG RETRIEVAL 10MM (BASKET)
SYS WOUND ALEXIS 18CM MED (MISCELLANEOUS)
SYSTEM BAG RETRIEVAL 10MM (BASKET) IMPLANT
SYSTEM WOUND ALEXIS 18CM MED (MISCELLANEOUS) IMPLANT
TOWEL OR NON WOVEN STRL DISP B (DISPOSABLE) IMPLANT
TRAP SPECIMEN MUCUS 40CC (MISCELLANEOUS) IMPLANT
TRAY FOLEY MTR SLVR 16FR STAT (SET/KITS/TRAYS/PACK) ×2 IMPLANT
TROCAR PORT AIRSEAL 5X120 (TROCAR) IMPLANT
UNDERPAD 30X36 HEAVY ABSORB (UNDERPADS AND DIAPERS) ×4 IMPLANT
WATER STERILE IRR 1000ML POUR (IV SOLUTION) ×2 IMPLANT
YANKAUER SUCT BULB TIP 10FT TU (MISCELLANEOUS) IMPLANT

## 2023-02-11 NOTE — Anesthesia Procedure Notes (Signed)
Procedure Name: Intubation Date/Time: 02/11/2023 1:27 PM  Performed by: Chinita Pester, CRNAPre-anesthesia Checklist: Patient identified, Emergency Drugs available, Suction available and Patient being monitored Patient Re-evaluated:Patient Re-evaluated prior to induction Oxygen Delivery Method: Circle System Utilized Preoxygenation: Pre-oxygenation with 100% oxygen Induction Type: IV induction Ventilation: Mask ventilation without difficulty Laryngoscope Size: Mac and 4 Grade View: Grade III Tube type: Oral Number of attempts: 1 Airway Equipment and Method: Stylet and Oral airway Placement Confirmation: ETT inserted through vocal cords under direct vision, positive ETCO2 and breath sounds checked- equal and bilateral Secured at: 22 cm Tube secured with: Tape Dental Injury: Teeth and Oropharynx as per pre-operative assessment

## 2023-02-11 NOTE — Transfer of Care (Signed)
Immediate Anesthesia Transfer of Care Note  Patient: Holly Brown  Procedure(s) Performed: XI ROBOTIC ASSISTED PELVIC LYMPH NODE DISSECTION  Patient Location: PACU  Anesthesia Type:General  Level of Consciousness: drowsy and responds to stimulation  Airway & Oxygen Therapy: Patient Spontanous Breathing and Patient connected to face mask oxygen  Post-op Assessment: Report given to RN and Post -op Vital signs reviewed and stable; pt shivering so BP and HR  falsely elevated upon arrival to PACU  Post vital signs: Reviewed and stable  Last Vitals:  Vitals Value Taken Time  BP    Temp    Pulse    Resp    SpO2      Last Pain:  Vitals:   02/11/23 1153  TempSrc:   PainSc: 0-No pain         Complications: No notable events documented.

## 2023-02-11 NOTE — Op Note (Signed)
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   Anesthesia: GET  Urine Output: 200 cc  Operative Findings: On EUA, some thickening of the mid and left portion of the cuff, likely related to recent surgery and postoperative hematoma.  No nodular areas.  On intra-abdominal entry, normal upper abdominal survey.  No ascites.  Omentum with filmy adhesions to the vaginal cuff.  Bilateral ovaries adherent to the cuff with rectum pulled up posteriorly, also adherent to the ovaries and the posterior aspect of the cuff.  No significant hematoma noted.  No appreciable adenopathy.  Estimated Blood Loss:  25 cc      Total IV Fluids: see I&O flowsheet         Specimens:  bilateral pelvic lymph nodes         Complications:  None; patient tolerated the procedure well.         Disposition: PACU - hemodynamically stable.  Procedure Details  The patient was seen in the Holding Room. The risks, benefits, complications, treatment options, and expected outcomes were discussed with the patient.  The patient concurred with the proposed plan, giving informed consent.  The site of surgery properly noted/marked. The patient was identified as Holly Brown and the procedure verified as a Robotic-assisted total bilateral pelvic lymph node dissection.   After induction of anesthesia, the patient was draped and prepped in the usual sterile manner. Patient was placed in supine position after anesthesia and draped and prepped in the usual sterile manner as follows: Her arms were  tucked to her side with all appropriate precautions.  The patient was secured to the bed using padding and tape across her chest.  The patient was placed in the semi-lithotomy position in Murphy stirrups.  The perineum and vagina were prepped with CHG.  Abdomen was prepped with ChloraPrep.  The patient was draped after the CholoraPrep had been allowed to dry for 3 minutes.  A Time Out was held and the above information confirmed.  The urethra was prepped with Betadine. Foley catheter was placed.  OG tube placement was confirmed and to suction.   Next, a 10 mm skin incision was made 1 cm below the subcostal margin in the midclavicular line.  The 5 mm Optiview port and scope was used for direct entry.  Opening pressure was under 10 mm CO2.  The abdomen was insufflated and the findings were noted as above.   At this point and all points during the procedure, the patient's intra-abdominal pressure did not exceed 15 mmHg. Next, an 8 mm skin incision was made superior to the umbilicus and a right and left port were placed about 8 cm lateral to the robot port on the right and left side.  A fourth arm was placed on the right.  The 5 mm assist trocar was exchanged for a 10-12 mm port. All ports were placed under direct visualization.  The patient was placed in steep Trendelenburg.  The robot was docked in the normal manner.  Attention was first turned to the deep pelvis.  Omental adhesions to the cuff and bilateral ovaries were lysed sharply, freeing the omentum.  Attention was then  turned to the right.  The right peritoneum was opened parallel to the IP ligament to open the retroperitoneal space.  The round ligament was transected.  The uterine artery was identified along the medial leaf of the broad ligament.  Pararectal and paravesical spaces were opened.  A pelvic lymph dissection was performed on the right with the following borders: proximally the bifurcation of the common iliac, distally the circumflex iliac  vein, laterally the genitofemoral nerve, the medial border was the superior vesicle artery and the deep border was the obturator nerve. All lymphatic tissue was removed and sent to Pathology. A similar procedure was performed on the contralateral side.   Irrigation was used and excellent hemostasis was achieved.  At this point in the procedure was completed.  Robotic instruments were removed under direct visulaization.  The robot was undocked. The fascia at the 10-12 mm port was closed with 0 Vicryl using a PMI fascial closure device under direct visualization.  The subcuticular tissue was closed with 4-0 Vicryl and the skin was closed with 4-0 Monocryl in a subcuticular manner.  Dermabond was applied.    Foley catheter was removed. All sponge, lap and needle counts were correct x  3.   The patient was transferred to the recovery room in stable condition.  Eugene Garnet, MD

## 2023-02-11 NOTE — Interval H&P Note (Signed)
History and Physical Interval Note:  02/11/2023 12:36 PM  Holly Brown  has presented today for surgery, with the diagnosis of CERVICAL CANCER.  The various methods of treatment have been discussed with the patient and family. After consideration of risks, benefits and other options for treatment, the patient has consented to  Procedure(s): XI ROBOTIC ASSISTED PELVIC LYMPH NODE DISSECTION (N/A) POSSIBLE LAPAROTOMY (N/A) as a surgical intervention.  The patient's history has been reviewed, patient examined, no change in status, stable for surgery.  I have reviewed the patient's chart and labs.  Questions were answered to the patient's satisfaction.     Carver Fila

## 2023-02-11 NOTE — Plan of Care (Signed)
CHL Tonsillectomy/Adenoidectomy, Postoperative PEDS care plan entered in error.

## 2023-02-11 NOTE — Anesthesia Preprocedure Evaluation (Addendum)
Anesthesia Evaluation  Patient identified by MRN, date of birth, ID band Patient awake    Reviewed: Allergy & Precautions, NPO status , Patient's Chart, lab work & pertinent test results  History of Anesthesia Complications (+) PONV and history of anesthetic complications  Airway Mallampati: II  TM Distance: >3 FB Neck ROM: Full    Dental  (+) Dental Advisory Given, Missing   Pulmonary former smoker   Pulmonary exam normal breath sounds clear to auscultation       Cardiovascular + Peripheral Vascular Disease  Normal cardiovascular exam Rhythm:Regular Rate:Normal     Neuro/Psych  Headaches, Seizures -, Well Controlled,     GI/Hepatic Neg liver ROS, PUD,GERD  Medicated,,  Endo/Other  diabetes  Obesity   Renal/GU negative Renal ROS     Musculoskeletal negative musculoskeletal ROS (+)    Abdominal   Peds  Hematology  (+) Blood dyscrasia, anemia   Anesthesia Other Findings Day of surgery medications reviewed with the patient.  Reproductive/Obstetrics  CERVICAL CANCER                             Anesthesia Physical Anesthesia Plan  ASA: 3  Anesthesia Plan: General   Post-op Pain Management: Tylenol PO (pre-op)* and Gabapentin PO (pre-op)*   Induction: Intravenous  PONV Risk Score and Plan: 4 or greater and Scopolamine patch - Pre-op, Midazolam, Dexamethasone and Ondansetron  Airway Management Planned: Oral ETT  Additional Equipment:   Intra-op Plan:   Post-operative Plan: Extubation in OR  Informed Consent: I have reviewed the patients History and Physical, chart, labs and discussed the procedure including the risks, benefits and alternatives for the proposed anesthesia with the patient or authorized representative who has indicated his/her understanding and acceptance.     Dental advisory given  Plan Discussed with: CRNA  Anesthesia Plan Comments: (2nd PIV after  induction.)        Anesthesia Quick Evaluation

## 2023-02-11 NOTE — Anesthesia Postprocedure Evaluation (Signed)
Anesthesia Post Note  Patient: Holly Brown  Procedure(s) Performed: XI ROBOTIC ASSISTED PELVIC LYMPH NODE DISSECTION     Patient location during evaluation: PACU Anesthesia Type: General Level of consciousness: awake and alert Pain management: pain level controlled Vital Signs Assessment: post-procedure vital signs reviewed and stable Respiratory status: spontaneous breathing, nonlabored ventilation and respiratory function stable Cardiovascular status: blood pressure returned to baseline and stable Postop Assessment: no apparent nausea or vomiting Anesthetic complications: no   No notable events documented.  Last Vitals:  Vitals:   02/11/23 1700 02/11/23 1715  BP: 126/72 126/81  Pulse: 62   Resp: 15 10  Temp: 37 C   SpO2: 99% 97%    Last Pain:  Vitals:   02/11/23 1723  TempSrc:   PainSc: 7                  Lowella Curb

## 2023-02-11 NOTE — Discharge Instructions (Addendum)
AFTER SURGERY INSTRUCTIONS   Return to work: 4-6 weeks if applicable   Activity: 1. Be up and out of the bed during the day.  Take a nap if needed.  You may walk up steps but be careful and use the hand rail.  Stair climbing will tire you more than you think, you may need to stop part way and rest.    2. No lifting or straining for 6 weeks over 10 pounds. No pushing, pulling, straining for 6 weeks.   3. No driving for around 1 week(s).  Do not drive if you are taking narcotic pain medicine and make sure that your reaction time has returned.    4. You can shower as soon as the next day after surgery. Shower daily.  Use your regular soap and water (not directly on the incision) and pat your incision(s) dry afterwards; don't rub.  No tub baths or submerging your body in water until cleared by your surgeon. If you have the soap that was given to you by pre-surgical testing that was used before surgery, you do not need to use it afterwards because this can irritate your incisions.    5. No sexual activity and nothing in the vagina as recommended by Dr. Alysia Penna.   6. You may experience a small amount of clear drainage from your incisions, which is normal.  If the drainage persists, increases, or changes color please call the office.   7. Do not use creams, lotions, or ointments such as neosporin on your incisions after surgery until advised by your surgeon because they can cause removal of the dermabond glue on your incisions.     8. Take Tylenol or ibuprofen first for pain if you are able to take these medications and only use narcotic pain medication for severe pain not relieved by the Tylenol or Ibuprofen.  Monitor your Tylenol intake to a max of 4,000 mg in a 24 hour period. You can alternate these medications after surgery.   Diet: 1. Low sodium Heart Healthy Diet is recommended but you are cleared to resume your normal (before surgery) diet after your procedure.   2. It is safe to use a  laxative, such as Miralax or Colace, if you have difficulty moving your bowels. You will be prescribed Sennakot-S to take at bedtime every evening after surgery to keep bowel movements regular and to prevent constipation.     Wound Care: 1. Keep clean and dry.  Shower daily.   Reasons to call the Doctor: Fever - Oral temperature greater than 100.4 degrees Fahrenheit Foul-smelling vaginal discharge Difficulty urinating Nausea and vomiting Increased pain at the site of the incision that is unrelieved with pain medicine. Difficulty breathing with or without chest pain New calf pain especially if only on one side Sudden, continuing increased vaginal bleeding with or without clots.   Contacts: For questions or concerns you should contact:   Dr. Eugene Garnet at [phone]   Warner Mccreedy, NP at [phone]   After Hours: call [phone] and have the GYN Oncologist paged/contacted (after 5 pm or on the weekends). You will speak with an after hours RN and let he or she know you have had surgery.   Messages sent via mychart are for non-urgent matters and are not responded to after hours so for urgent needs, please call the after hours number.

## 2023-02-12 ENCOUNTER — Telehealth: Payer: Self-pay | Admitting: *Deleted

## 2023-02-12 NOTE — Telephone Encounter (Signed)
Spoke with Ms. Seabrook this morning. She states she is eating, drinking and urinating well. She has not had a BM yet but is passing gas. She is taking senokot as prescribed and encouraged her to drink plenty of water. She denies fever or chills. Incisions are dry and intact. She rates her pain 6/10. Her pain is controlled with oxycodone.    Instructed to call office with any fever, chills, purulent drainage, uncontrolled pain or any other questions or concerns. Patient verbalizes understanding.   Pt aware of post op appointments as well as the office number [phone] and after hours number [phone] to call if she has any questions or concerns

## 2023-02-13 ENCOUNTER — Telehealth: Payer: Self-pay | Admitting: *Deleted

## 2023-02-13 NOTE — Telephone Encounter (Signed)
Called and spoke with Holly Brown at [facility] [phone]) and gave the following information  -surgery date  -CPT code -return to work date

## 2023-02-16 LAB — SURGICAL PATHOLOGY

## 2023-02-16 NOTE — Progress Notes (Signed)
noted 

## 2023-02-18 ENCOUNTER — Inpatient Hospital Stay: Payer: 59 | Attending: Gynecologic Oncology | Admitting: Gynecologic Oncology

## 2023-02-18 ENCOUNTER — Encounter: Payer: Self-pay | Admitting: Gynecologic Oncology

## 2023-02-18 DIAGNOSIS — Z90710 Acquired absence of both cervix and uterus: Secondary | ICD-10-CM

## 2023-02-18 DIAGNOSIS — Z9079 Acquired absence of other genital organ(s): Secondary | ICD-10-CM

## 2023-02-18 DIAGNOSIS — C539 Malignant neoplasm of cervix uteri, unspecified: Secondary | ICD-10-CM

## 2023-02-18 DIAGNOSIS — Z7189 Other specified counseling: Secondary | ICD-10-CM

## 2023-02-18 NOTE — Progress Notes (Signed)
Gynecologic Oncology Telehealth Note: Gyn-Onc  I connected with Holly Brown on 02/18/23 at  4:00 PM EDT by telephone and verified that I am speaking with the correct person using two identifiers.  I discussed the limitations, risks, security and privacy concerns of performing an evaluation and management service by telemedicine and the availability of in-person appointments. I also discussed with the patient that there may be a patient responsible charge related to this service. The patient expressed understanding and agreed to proceed.  Other persons participating in the visit and their role in the encounter: none.  Patient's location: home Provider's location: [facility]  Reason for Visit: follow-up  Treatment History: Patient has a long history of abnormal uterine bleeding refractory to medical management.  History also notable for cervical dysplasia.  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  Repeat Pap in 02/2022 was again HSIL, HPV 16+.  Patient was also found to be positive for chlamydia and trichomoniasis at that time.  Cold knife cone biopsy was performed on 03/18/2022 revealing CIN-2-3 with extension into underlying endocervical glands, negative for invasive carcinoma.  Endocervical margin was positive for high-grade dysplasia.  STD testing in March 2024 again showed patient positive for chlamydia and trichomoniasis.   More recently, Pap test on 10/13/2022 was HSIL, high risk HPV positive.  2 cervical biopsies both showed CIN-1, ECC showed CIN-2-3.   In the setting of abnormal uterine bleeding, the patient was offered definitive surgical management and on 12/16/2022 underwent TV[facility] lateral salpingectomy.  This included a 10-week sized uterus with normal tubes and ovaries.  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  Lymphovascular invasion not  identified.  All margins were negative for invasive carcinoma and high-grade dysplasia.   Patient was ultimately admitted approximately 2 weeks postop with tachycardia, leukocytosis, and CT scan demonstrating a 7 cm fluid collection superior to the vaginal cuff concerning for hematoma.  Patient was treated conservatively with IV antibiotics during her hospitalization, discharged on Augmentin.  Patient was also found to have a pansensitive E. coli UTI, was treated for her known peptic ulcer disease.  Discharged on 9/12.  She was then seen by her primary care provider and given elevated D-dimer in the setting of dyspnea on exertion since her surgery, patient was sent to the emergency department for evaluation and to rule out pulmonary embolism.  CT angio negative for PE.  CT A/P on 12/19/22:  1. Moderate high attenuation free fluid within the lower pelvis, consistent with postoperative blood products after recent vaginal hysterectomy. 2. Otherwise unremarkable exam.  02/11/23: Robotic-assisted laparoscopic bilateral pelvic lymphadenectomy   Interval History: Up and down, improving daily. Walking a lot.  First bowel movement today. Using sennakot and miralax.  Denies urinary symptoms, no bleeding.  Past Medical/Surgical History: Past Medical History:  Diagnosis Date   Gestational diabetes    metformin - resolved after delivery   History of UTI    HSV (herpes simplex virus) infection    Migraines    Peripheral vascular disease (HCC)    Post-operative nausea and vomiting 12/19/2022   Seizures (HCC)    Jan.2016 - only had one seizure, none since   Vaginal Pap smear, abnormal     Past Surgical History:  Procedure Laterality Date   BIOPSY  12/24/2022   Procedure: BIOPSY;  Surgeon: Benancio Deeds, MD;  Location: [facility] END[facility]COPY;  Service: Gastroenterology;;   DILATION AND CURETTAGE OF UTERUS     ESOPHAGOGASTRODUODENOSCOPY (EGD) WITH PROPOFOL N/A 12/24/2022   Procedure:  ESOPHAGOGASTRODUODENOSCOPY (EGD) WITH PROPOFOL;  Surgeon: Benancio Deeds, MD;  Location[facility]COPY;  Service: Gastroenterology;  Laterality: N/A;   LAPAROSCOPIC TUBAL LIGATION Bilateral 12/08/2018   Procedure: LAPAROSCOPIC TUBAL LIGATION;  Surgeon: Catalina Antigua, MD;  Location: [facility];  Service: Gynecology;  Laterality: Bilateral;   LEEP N/A 03/18/2022   Procedure: LOOP ELECTROSURGICAL EXCISION PROCEDURE (LEEP);  Surgeon: Catalina Antigua, MD;  Location: [facility] OR;  Service: Gynecology;  Laterality: N/A;   VAGINAL HYSTERECTOMY N/A 12/16/2022   Procedure: TOTAL VAGINAL HYSTERECTOMY, BILATERAL SALPINGECTOMY;  Surgeon: Hermina Staggers, MD;  Location: [facility] OR;  Service: Gynecology;  Laterality: N/A;   WISDOM TOOTH EXTRACTION  04/21/2009    Family History  Problem Relation Age of Onset   Diabetes Mother    Stroke Mother    Colon cancer Father 35   Hypertension Maternal Grandmother    Diabetes Maternal Grandmother    Hypertension Paternal Grandmother    Breast cancer Neg Hx    Ovarian cancer Neg Hx    Endometrial cancer Neg Hx    Pancreatic cancer Neg Hx    Prostate cancer Neg Hx     Social History   Socioeconomic History   Marital status: Single    Spouse name: Not on file   Number of children: 3   Years of education: college   Highest education level: Some college, no degree  Occupational History    Employer: [employer]    Comment: Tax inspector  Tobacco Use   Smoking status: Former    Current packs/day: 0.25    Average packs/day: 0.3 packs/day for 11.2 years (2.8 ttl pk-yrs)    Types: Cigarettes    Start date: 12/21/2011    Passive exposure: Never   Smokeless tobacco: Never  Vaping Use   Vaping status: Never Used  Substance and Sexual Activity   Alcohol use: Yes    Comment: ocassionally    Drug use: Not Currently    Types: Marijuana    Comment: Last use was in 2023   Sexual activity: Not Currently    Partners: Male    Birth  control/protection: Surgical    Comment: Tubal Ligation  Other Topics Concern   Not on file  Social History Narrative   Lives with her grandmother and her three children   Does not drink caffeine    Social Determinants of Health   Financial Resource Strain: Low Risk  (01/14/2023)   Overall Financial Resource Strain (CARDIA)    Difficulty of Paying Living Expenses: Not hard at all  Food Insecurity: No Food Insecurity (01/14/2023)   Hunger Vital Sign    Worried About Running Out of Food in the Last Year: Never true    Ran Out of Food in the Last Year: Never true  Transportation Needs: No Transportation Needs (01/14/2023)   PRAPARE - Administrator, Civil Service (Medical): No    Lack of Transportation (Non-Medical): No  Physical Activity: Unknown (01/14/2023)   Exercise Vital Sign    Days of Exercise per Week: 0 days    Minutes of Exercise per Session: Not on file  Stress: No Stress Concern Present (01/14/2023)   [employer] of Occupational Health - Occupational Stress Questionnaire    Feeling of Stress : Not at all  Social Connections: Moderately Isolated (01/14/2023)   Social Connection  and Isolation Panel [NHANES]    Frequency of Communication with Friends and Family: More than three times a week    Frequency of Social Gatherings with Friends and Family: Never    Attends Religious Services: 1 to 4 times per year    Active Member of [employer] or Organizations: No    Attends Engineer, structural: Not on file    Marital Status: Never married    Current Medications:  Current Outpatient Medications:    ibuprofen (ADVIL) 800 MG tablet, Take 1 tablet (800 mg total) by mouth every 8 (eight) hours as needed for moderate pain (pain score 4-6) or cramping. For AFTER surgery only, Disp: 30 tablet, Rfl: 1   Multiple Vitamins-Minerals (HAIR SKIN AND NAILS FORMULA PO), Take 1 tablet by mouth daily., Disp: , Rfl:    oxyCODONE (OXY IR/ROXICODONE) 5 MG immediate release  tablet, Take 1 tablet (5 mg total) by mouth every 4 (four) hours as needed for severe pain (pain score 7-10). For AFTER surgery only, do not take and drive, Disp: 15 tablet, Rfl: 0   pantoprazole (PROTONIX) 40 MG tablet, Take 1 tablet (40 mg total) by mouth daily., Disp: 30 tablet, Rfl: 1   sennosides-docusate sodium (SENOKOT-S) 8.6-50 MG tablet, Take 2 tablets by mouth daily. For AFTER surgery, do not take if having loose stools, Disp: 30 tablet, Rfl: 0  Review of Symptoms: Pertinent positives as per HPI.  Physical Exam: Deferred given limitations of phone visit.  Laboratory & Radiologic Studies: A. RIGHT PELVIC LYMPH NODE, EXCISION:  Four benign lymph nodes, negative for carcinoma (0/4)   B. LEFT PELVIC LYMPH NODE, EXCISION:  Five benign lymph nodes, negative for carcinoma (0/5)   Assessment & Plan: Holly Brown is a 37 y.o. 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   Doing well. Meeting post-op milestones.  Discussed pathology from surgery.  She is very happy with this news.  No adjuvant radiation is indicated.  I discussed the assessment and treatment plan with the patient. The patient was provided with an opportunity to ask questions and all were answered. The patient agreed with the plan and demonstrated an understanding of the instructions.   The patient was advised to call back or see an in-person evaluation if the symptoms worsen or if the condition fails to improve as anticipated.   8 minutes of total time was spent for this patient encounter, including preparation, phone counseling with the patient and coordination of care, and documentation of the encounter.   Eugene Garnet, MD  Division of Gynecologic Oncology  Department of Obstetrics and Gynecology  [facility]

## 2023-02-19 ENCOUNTER — Telehealth: Payer: Self-pay

## 2023-02-19 NOTE — Telephone Encounter (Signed)
Pt is aware of message from Dr.Tucker, yes the pain was the left upper incision, she states her pain is now 2/10, she will continue to monitor and use restrictions.

## 2023-02-19 NOTE — Telephone Encounter (Signed)
Was this pain up by left incision? We discussed yesterday that this is the largest incision and has an internal stitch through the muscle. If feeling better, I would recommend continued restrictions (no heavy lifting), and using ice/heat if helpful.

## 2023-02-19 NOTE — Telephone Encounter (Signed)
S/P Robotic assisted pelvic lymph node dissection on 02/11/23 with Dr. Pricilla Holm   Office received a telephone advice record (after hour triage) Pt called evening of 02/18/23 stating she was in severe pain on left side of abdomen. 10/10 on pain scale. On call provider was not called by triage nurse, patient was advised to go to ER.   In reviewing pt's record she did not go to the ER. I spoke to Holly Brown this morning and she stated she had taken a percocet and laid down on her back, when she went to sit up she had a severe sharpe pain on her left side, at that time she had shortness of breath due to the pain. She called triage and they told her to go to the ER. Pt reports she didn't go because she would have had to get her kids up and find somewhere for them to go. Instead she laid back down for awhile and when she went to sit up again there was no pain. Pt reports this morning pain 5/10 on pain scale she took Ibuprofen right before I called. The pain is at the upper incision.Reports she was not doing anything prior to the pain starting. She thinks she just got up/twisted wrong.  She is eating/drinking ok, no fever/chills. Reports having a lot of gas since surgery, small BM's, but her "real poop" wasn't until yesterday. She has been taking Miralax and also Senna.   Discussed with RN,  Advised pt to monitor, continue with Tylenol/Ibuprofen, can use heating pad/warm shower. She is aware Dr.Tucker is out of the office, message sent and will call her back with advice.

## 2023-02-23 ENCOUNTER — Other Ambulatory Visit (HOSPITAL_COMMUNITY): Payer: Self-pay

## 2023-03-02 ENCOUNTER — Other Ambulatory Visit: Payer: Self-pay | Admitting: Oncology

## 2023-03-02 NOTE — Progress Notes (Signed)
Gynecologic Oncology Multi-Disciplinary Disposition Conference Note  Date of the Conference: 03/02/2023  Patient Name: Holly Brown  Referring Provider: Dr. Alysia Penna Primary GYN Oncologist: Dr. 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 Disposition is to close observation.   This Multidisciplinary conference took place involving physicians from Gynecologic Oncology, Medical Oncology, Radiation Oncology, Pathology, Radiology along with the Gynecologic Oncology Nurse Practitioner and Gynecologic Oncology Nurse Navigator.  Comprehensive assessment of the patient's malignancy, staging, need for surgery, chemotherapy, radiation therapy, and need for further testing were reviewed. Supportive measures, both inpatient and following discharge were also discussed. The recommended plan of care is documented. Greater than 35 minutes were spent correlating and coordinating this patient's care.

## 2023-03-06 ENCOUNTER — Inpatient Hospital Stay: Payer: 59 | Attending: Gynecologic Oncology | Admitting: Gynecologic Oncology

## 2023-03-06 ENCOUNTER — Encounter: Payer: Self-pay | Admitting: Gynecologic Oncology

## 2023-03-06 VITALS — BP 105/63 | HR 84 | Temp 98.2°F | Resp 20 | Wt 225.0 lb

## 2023-03-06 DIAGNOSIS — Z9889 Other specified postprocedural states: Secondary | ICD-10-CM

## 2023-03-06 DIAGNOSIS — Z7189 Other specified counseling: Secondary | ICD-10-CM

## 2023-03-06 DIAGNOSIS — C539 Malignant neoplasm of cervix uteri, unspecified: Secondary | ICD-10-CM

## 2023-03-06 NOTE — Progress Notes (Signed)
Gynecologic Oncology Return Clinic Visit  03/06/23  Reason for Visit: follow-up, treatment planning  Treatment History: Patient has a long history of abnormal uterine bleeding refractory to medical management.  History also notable for cervical dysplasia.  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  Repeat Pap in 02/2022 was again HSIL, HPV 16+.  Patient was also found to be positive for chlamydia and trichomoniasis at that time.  Cold knife cone biopsy was performed on 03/18/2022 revealing CIN-2-3 with extension into underlying endocervical glands, negative for invasive carcinoma.  Endocervical margin was positive for high-grade dysplasia.  STD testing in March 2024 again showed patient positive for chlamydia and trichomoniasis.   More recently, Pap test on 10/13/2022 was HSIL, high risk HPV positive.  2 cervical biopsies both showed CIN-1, ECC showed CIN-2-3.   In the setting of abnormal uterine bleeding, the patient was offered definitive surgical management and on 12/16/2022 underwent TVH lateral salpingectomy.  This included a 10-week sized uterus with normal tubes and ovaries.  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  Lymphovascular invasion not identified.  All margins were negative for invasive carcinoma and high-grade dysplasia.  02/11/23: Robotic bilateral pelvic lymphadenectomy  Interval History: The patient report is overall doing well.  Having some lower pelvic pain, intermittent cramping, feeling like she is passing gas on the inside.  She overall continues to see improvement.  Denies any vaginal bleeding.  Reports normal bowel function.  Denies any urinary symptoms.  Past Medical/Surgical History: Past Medical History:  Diagnosis Date   Gestational diabetes    metformin - resolved after delivery   History of UTI    HSV (herpes simplex  virus) infection    Migraines    Peripheral vascular disease (HCC)    Post-operative nausea and vomiting 12/19/2022   Seizures (HCC)    Jan.2016 - only had one seizure, none since   Vaginal Pap smear, abnormal     Past Surgical History:  Procedure Laterality Date   BIOPSY  12/24/2022   Procedure: BIOPSY;  Surgeon: Benancio Deeds, MD;  Location: [facility];  Service: Gastroenterology;;   DILATION AND CURETTAGE OF UTERUS     ESOPHAGOGASTRODUODENOSCOPY (EGD) WITH PROPOFOL N/A 12/24/2022   Procedure: ESOPHAGOGASTRODUODENOSCOPY (EGD) WITH PROPOFOL;  Surgeon: Benancio Deeds, MD;  Location: [facility];  Service: Gastroenterology;  Laterality: N/A;   LAPAROSCOPIC TUBAL LIGATION Bilateral 12/08/2018   Procedure: LAPAROSCOPIC TUBAL LIGATION;  Surgeon: Catalina Antigua, MD;  Location: [facility];  Service: Gynecology;  Laterality: Bilateral;   LEEP N/A 03/18/2022   Procedure: LOOP ELECTROSURGICAL EXCISION PROCEDURE (LEEP);  Surgeon: Catalina Antigua, MD;  Location: [facility];  Service: Gynecology;  Laterality: N/A;   VAGINAL HYSTERECTOMY N/A 12/16/2022   Procedure: TOTAL VAGINAL HYSTERECTOMY, BILATERAL SALPINGECTOMY;  Surgeon: Hermina Staggers, MD;  Location: [facility];  Service: Gynecology;  Laterality: N/A;   WISDOM TOOTH EXTRACTION  04/21/2009    Family History  Problem Relation Age of Onset   Diabetes Mother    Stroke Mother    Colon cancer Father 30   Hypertension Maternal Grandmother    Diabetes Maternal Grandmother    Hypertension Paternal Grandmother    Breast cancer Neg Hx    Ovarian cancer Neg Hx    Endometrial cancer Neg Hx    Pancreatic cancer Neg Hx    Prostate cancer  Neg Hx     Social History   Socioeconomic History   Marital status: Single    Spouse name: Not on file   Number of children: 3   Years of education: college   Highest education level: Some college, no degree  Occupational History    Employer: [employer]    Comment: Tax inspector   Tobacco Use   Smoking status: Former    Current packs/day: 0.25    Average packs/day: 0.3 packs/day for 11.2 years (2.8 ttl pk-yrs)    Types: Cigarettes    Start date: 12/21/2011    Passive exposure: Never   Smokeless tobacco: Never  Vaping Use   Vaping status: Never Used  Substance and Sexual Activity   Alcohol use: Yes    Comment: ocassionally    Drug use: Not Currently    Types: Marijuana    Comment: Last use was in 2023   Sexual activity: Not Currently    Partners: Male    Birth control/protection: Surgical    Comment: Tubal Ligation  Other Topics Concern   Not on file  Social History Narrative   Lives with her grandmother and her three children   Does not drink caffeine    Social Determinants of Health   Financial Resource Strain: Low Risk  (01/14/2023)   Overall Financial Resource Strain (CARDIA)    Difficulty of Paying Living Expenses: Not hard at all  Food Insecurity: No Food Insecurity (01/14/2023)   Hunger Vital Sign    Worried About Running Out of Food in the Last Year: Never true    Ran Out of Food in the Last Year: Never true  Transportation Needs: No Transportation Needs (01/14/2023)   PRAPARE - Administrator, Civil Service (Medical): No    Lack of Transportation (Non-Medical): No  Physical Activity: Unknown (01/14/2023)   Exercise Vital Sign    Days of Exercise per Week: 0 days    Minutes of Exercise per Session: Not on file  Stress: No Stress Concern Present (01/14/2023)   [employer] of Occupational Health - Occupational Stress Questionnaire    Feeling of Stress : Not at all  Social Connections: Moderately Isolated (01/14/2023)   Social Connection and Isolation Panel [NHANES]    Frequency of Communication with Friends and Family: More than three times a week    Frequency of Social Gatherings with Friends and Family: Never    Attends Religious Services: 1 to 4 times per year    Active Member of [employer] or Organizations: No    Attends Probation officer: Not on file    Marital Status: Never married    Current Medications:  Current Outpatient Medications:    ibuprofen (ADVIL) 800 MG tablet, Take 1 tablet (800 mg total) by mouth every 8 (eight) hours as needed for moderate pain (pain score 4-6) or cramping. For AFTER surgery only, Disp: 30 tablet, Rfl: 1   Multiple Vitamins-Minerals (HAIR SKIN AND NAILS FORMULA PO), Take 1 tablet by mouth daily., Disp: , Rfl:    pantoprazole (PROTONIX) 40 MG tablet, Take 1 tablet (40 mg total) by mouth daily., Disp: 30 tablet, Rfl: 1  Review of Systems: Denies appetite changes, fevers, chills, fatigue, unexplained weight changes. Denies hearing loss, neck lumps or masses, mouth sores, ringing in ears or voice changes. Denies cough or wheezing.  Denies shortness of breath. Denies chest pain or palpitations. Denies leg swelling. Denies abdominal distention, pain, blood in stools, constipation, diarrhea, nausea, vomiting, or  early satiety. Denies pain with intercourse, dysuria, frequency, hematuria or incontinence. Denies hot flashes, pelvic pain, vaginal bleeding or vaginal discharge.   Denies joint pain, back pain or muscle pain/cramps. Denies itching, rash, or wounds. Denies dizziness, headaches, numbness or seizures. Denies swollen lymph nodes or glands, denies easy bruising or bleeding. Denies anxiety, depression, confusion, or decreased concentration.  Physical Exam: BP 105/63 (BP Location: Left Arm, Patient Position: Sitting)   Pulse 84   Temp 98.2 F (36.8 C) (Oral)   Resp 20   Wt 225 lb (102.1 kg)   LMP  (LMP Unknown) Comment: Depo Injections - last injection 10/2022, had spotting on and off since 10/2022 injection  SpO2 100%   BMI 32.28 kg/m  General: Alert, oriented, no acute distress. HEENT: Normocephalic, atraumatic, sclera anicteric. Chest: Unlabored breathing on room air. Cardiovascular: Regular rate and rhythm, no murmurs. Abdomen: soft, nontender.   Normoactive bowel sounds.  No masses or hepatosplenomegaly appreciated.  Well-healed incisions.  Extremities: Grossly normal range of motion.  Warm, well perfused.  No edema bilaterally.  Laboratory & Radiologic Studies: A. RIGHT PELVIC LYMPH NODE, EXCISION:  Four benign lymph nodes, negative for carcinoma (0/4)   B. LEFT PELVIC LYMPH NODE, EXCISION:  Five benign lymph nodes, negative for carcinoma (0/5)   Assessment & Plan: MYASIAH SJOBLOM is a 37 y.o. 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  S/p staging LND in 01/2023.   Doing well. Meeting post-op milestones.  Discussed continued expectations and restrictions.  She was given some adhesive remover to take off the Dermabond after she showers next.  Reviewed pathology from surgery.  She was given a copy of her pathology report.  Also took pictures after lymph node dissection which we reviewed together today.  Given negative lymph nodes, she does not meet sepsis criteria.  No adjuvant radiation indicated.  We discussed NCCN surveillance recommendations including a visit and exam every 6 months.  I reviewed signs and symptoms that would be concerning for cancer recurrence and stressed the importance of calling if she develops any of these between visits.   18 minutes of total time was spent for this patient encounter, including preparation, face-to-face counseling with the patient and coordination of care, and documentation of the encounter.  Eugene Garnet, MD  Division of Gynecologic Oncology  Department of Obstetrics and Gynecology  [facility]

## 2023-03-06 NOTE — Patient Instructions (Signed)
It was good to see you today.  You are healing well from surgery.  Please remember, no heavy lifting until 6 weeks after surgery.  I will plan to see you for follow-up in 6 months.  As we discussed today, if you have any new and concerning symptoms before then, please call to see me sooner.  These would include bleeding, pelvic pain, change to bowel or bladder function, or unintentional weight loss.

## 2023-03-09 ENCOUNTER — Encounter: Payer: Self-pay | Admitting: Oncology

## 2023-03-09 NOTE — Progress Notes (Signed)
Requested PD-L1 testing on accession [id number] with [facility] via email.

## 2023-03-12 ENCOUNTER — Encounter: Payer: 59 | Admitting: Family

## 2023-03-25 ENCOUNTER — Ambulatory Visit (INDEPENDENT_AMBULATORY_CARE_PROVIDER_SITE_OTHER): Payer: Medicaid Other

## 2023-03-25 DIAGNOSIS — Z23 Encounter for immunization: Secondary | ICD-10-CM | POA: Diagnosis not present

## 2023-04-02 LAB — MOLECULAR PATHOLOGY

## 2023-04-03 ENCOUNTER — Ambulatory Visit: Payer: 59 | Admitting: Diagnostic Neuroimaging

## 2023-04-07 ENCOUNTER — Other Ambulatory Visit (HOSPITAL_COMMUNITY): Payer: Self-pay

## 2023-04-07 ENCOUNTER — Other Ambulatory Visit: Payer: Medicaid Other

## 2023-04-07 ENCOUNTER — Ambulatory Visit: Payer: Medicaid Other | Admitting: Family

## 2023-04-07 VITALS — BP 106/68 | HR 98 | Temp 98.5°F | Ht 71.0 in | Wt 225.4 lb

## 2023-04-07 DIAGNOSIS — R102 Pelvic and perineal pain: Secondary | ICD-10-CM

## 2023-04-07 DIAGNOSIS — N898 Other specified noninflammatory disorders of vagina: Secondary | ICD-10-CM

## 2023-04-07 DIAGNOSIS — R790 Abnormal level of blood mineral: Secondary | ICD-10-CM | POA: Diagnosis not present

## 2023-04-07 DIAGNOSIS — Z13220 Encounter for screening for lipoid disorders: Secondary | ICD-10-CM

## 2023-04-07 DIAGNOSIS — R590 Localized enlarged lymph nodes: Secondary | ICD-10-CM

## 2023-04-07 DIAGNOSIS — N3001 Acute cystitis with hematuria: Secondary | ICD-10-CM

## 2023-04-07 LAB — CBC WITH DIFFERENTIAL/PLATELET
Basophils Absolute: 0 10*3/uL (ref 0.0–0.1)
Basophils Relative: 0.6 % (ref 0.0–3.0)
Eosinophils Absolute: 0.2 10*3/uL (ref 0.0–0.7)
Eosinophils Relative: 2.6 % (ref 0.0–5.0)
HCT: 36.8 % (ref 36.0–46.0)
Hemoglobin: 12.1 g/dL (ref 12.0–15.0)
Lymphocytes Relative: 29.7 % (ref 12.0–46.0)
Lymphs Abs: 2 10*3/uL (ref 0.7–4.0)
MCHC: 32.8 g/dL (ref 30.0–36.0)
MCV: 90.8 fL (ref 78.0–100.0)
Monocytes Absolute: 0.5 10*3/uL (ref 0.1–1.0)
Monocytes Relative: 7.3 % (ref 3.0–12.0)
Neutro Abs: 4 10*3/uL (ref 1.4–7.7)
Neutrophils Relative %: 59.8 % (ref 43.0–77.0)
Platelets: 388 10*3/uL (ref 150.0–400.0)
RBC: 4.05 Mil/uL (ref 3.87–5.11)
RDW: 14.8 % (ref 11.5–15.5)
WBC: 6.8 10*3/uL (ref 4.0–10.5)

## 2023-04-07 LAB — POCT URINALYSIS DIP (CLINITEK)
Bilirubin, UA: NEGATIVE
Glucose, UA: NEGATIVE mg/dL
Ketones, POC UA: NEGATIVE mg/dL
Nitrite, UA: POSITIVE — AB
POC PROTEIN,UA: NEGATIVE
Spec Grav, UA: 1.015 (ref 1.010–1.025)
Urobilinogen, UA: 0.2 U/dL
pH, UA: 6 (ref 5.0–8.0)

## 2023-04-07 LAB — MAGNESIUM: Magnesium: 1.9 mg/dL (ref 1.5–2.5)

## 2023-04-07 LAB — LIPID PANEL
Cholesterol: 154 mg/dL (ref 0–200)
HDL: 45.2 mg/dL (ref >39.00)
LDL Cholesterol: 88 mg/dL (ref 0–99)
NonHDL: 108.39
Total CHOL/HDL Ratio: 3
Triglycerides: 103 mg/dL (ref 0.0–149.0)
VLDL: 20.6 mg/dL (ref 0.0–40.0)

## 2023-04-07 MED ORDER — SULFAMETHOXAZOLE-TRIMETHOPRIM 800-160 MG PO TABS
1.0000 | ORAL_TABLET | Freq: Two times a day (BID) | ORAL | 0 refills | Status: AC
  Filled 2023-04-07: qty 14, 7d supply, fill #0

## 2023-04-07 NOTE — Assessment & Plan Note (Signed)
Repeating today pending results.

## 2023-04-07 NOTE — Assessment & Plan Note (Signed)
Inguinal lymphadenopathy and poc urine dip with positive blood, leuks and nitrates.  poct urine dip in office Urine culture ordered pending results antbx sent to pharmacy, pt to take as directed. Encouraged increased water intake throughout the day. Choosing to treat due to being symptomatic. If no improvement in the next 2 days pt advised to let me know. Rx bactrim 800/160 mg po bid x 7 days

## 2023-04-07 NOTE — Progress Notes (Signed)
Established Patient Office Visit  Subjective:      CC:  Chief Complaint  Patient presents with   Medical Management of Chronic Issues    HPI: Holly Brown is a 37 y.o. female presenting on 04/07/2023 for Medical Management of Chronic Issues . Weakness bil LE, was referred to physical therapy back in September. Has been helpful.   S/p hysterectomy: f/u was 03/06/23 with gynecology Dr. Pricilla Holm. Neg lymph nodes per note, no adjuvant radiation needed. Visit and exam scheduled for every six months for monitoring.  Just started back to work two weeks ago, and going well per her report.   She states she has lower pelvic tenderness with soreness, had to take an 800 mg ibuprofen last night due to this. Hurting to put pressure on the area while she goes to walk. She feels a 'knot' in this area that is tender. Did notice a dark brown discharge very scant over the weekend, but not in the last two days. No dysuria, no frequency or urgency. Will feel it when she initially gets up and then when walking eases off a bit but not completely. Pain started about two weeks ago, but has only just started to become more prominent in the last four days. No change in bowel movements.   Social history:  Relevant past medical, surgical, family and social history reviewed and updated as indicated. Interim medical history since our last visit reviewed.  Allergies and medications reviewed and updated.  DATA REVIEWED: CHART IN EPIC     ROS: Negative unless specifically indicated above in HPI.    Current Outpatient Medications:    sulfamethoxazole-trimethoprim (BACTRIM DS) 800-160 MG tablet, Take 1 tablet by mouth 2 (two) times daily for 7 days., Disp: 14 tablet, Rfl: 0   Multiple Vitamins-Minerals (HAIR SKIN AND NAILS FORMULA PO), Take 1 tablet by mouth daily., Disp: , Rfl:    pantoprazole (PROTONIX) 40 MG tablet, Take 1 tablet (40 mg total) by mouth daily., Disp: 30 tablet, Rfl: 1      Objective:     BP 106/68 (BP Location: Right Arm, Patient Position: Sitting, Cuff Size: Normal)   Pulse 98   Temp 98.5 F (36.9 C) (Temporal)   Ht 5\' 11"  (1.803 m)   Wt 225 lb 6.4 oz (102.2 kg)   LMP  (LMP Unknown) Comment: Depo Injections - last injection 10/2022, had spotting on and off since 10/2022 injection  SpO2 99%   BMI 31.44 kg/m   Wt Readings from Last 3 Encounters:  04/07/23 225 lb 6.4 oz (102.2 kg)  03/06/23 225 lb (102.1 kg)  02/11/23 221 lb (100.2 kg)    Physical Exam Constitutional:      General: She is not in acute distress.    Appearance: Normal appearance. She is obese. She is not ill-appearing, toxic-appearing or diaphoretic.  HENT:     Head: Normocephalic.  Cardiovascular:     Rate and Rhythm: Normal rate and regular rhythm.  Pulmonary:     Effort: Pulmonary effort is normal.     Breath sounds: Normal breath sounds.  Abdominal:     Tenderness: There is no abdominal tenderness.     Hernia: No hernia is present. There is no hernia in the left inguinal area.  Musculoskeletal:        General: Normal range of motion.  Lymphadenopathy:     Lower Body: Left inguinal adenopathy (slight tenderness) present.  Neurological:     General: No focal deficit present.  Mental Status: She is alert and oriented to person, place, and time. Mental status is at baseline.  Psychiatric:        Mood and Affect: Mood normal.        Behavior: Behavior normal.        Thought Content: Thought content normal.        Judgment: Judgment normal.           Assessment & Plan:  Pelvic pain -     POCT URINALYSIS DIP (CLINITEK) -     WET PREP BY MOLECULAR PROBE  Lymphadenopathy, inguinal -     POCT URINALYSIS DIP (CLINITEK) -     WET PREP BY MOLECULAR PROBE -     CBC with Differential/Platelet  Screening for lipoid disorders -     Lipid panel  Low magnesium level Assessment & Plan: Repeating today pending results.   Orders: -     Magnesium  Vaginal discharge -     WET PREP  BY MOLECULAR PROBE  Acute cystitis with hematuria Assessment & Plan: Inguinal lymphadenopathy and poc urine dip with positive blood, leuks and nitrates.  poct urine dip in office Urine culture ordered pending results antbx sent to pharmacy, pt to take as directed. Encouraged increased water intake throughout the day. Choosing to treat due to being symptomatic. If no improvement in the next 2 days pt advised to let me know. Rx bactrim 800/160 mg po bid x 7 days   Orders: -     Sulfamethoxazole-Trimethoprim; Take 1 tablet by mouth 2 (two) times daily for 7 days.  Dispense: 14 tablet; Refill: 0     Return in about 6 months (around 10/06/2023) for f/u CPE.  Mort Sawyers, MSN, APRN, FNP-C [facility]

## 2023-04-08 ENCOUNTER — Other Ambulatory Visit: Payer: Self-pay

## 2023-04-08 ENCOUNTER — Other Ambulatory Visit (HOSPITAL_COMMUNITY): Payer: Self-pay

## 2023-04-08 ENCOUNTER — Other Ambulatory Visit: Payer: Self-pay | Admitting: Family

## 2023-04-08 DIAGNOSIS — N760 Acute vaginitis: Secondary | ICD-10-CM

## 2023-04-08 DIAGNOSIS — B3731 Acute candidiasis of vulva and vagina: Secondary | ICD-10-CM

## 2023-04-08 LAB — WET PREP BY MOLECULAR PROBE
Candida species: DETECTED — AB
MICRO NUMBER:: 15860695
SPECIMEN QUALITY:: ADEQUATE
Trichomonas vaginosis: NOT DETECTED

## 2023-04-08 MED ORDER — METRONIDAZOLE 500 MG PO TABS
500.0000 mg | ORAL_TABLET | Freq: Two times a day (BID) | ORAL | 0 refills | Status: AC
  Filled 2023-04-08: qty 14, 7d supply, fill #0

## 2023-04-08 MED ORDER — FLUCONAZOLE 150 MG PO TABS
150.0000 mg | ORAL_TABLET | Freq: Once | ORAL | 0 refills | Status: AC
  Filled 2023-04-08: qty 1, 1d supply, fill #0

## 2023-04-09 ENCOUNTER — Ambulatory Visit: Payer: Self-pay | Admitting: Family

## 2023-04-09 ENCOUNTER — Ambulatory Visit: Payer: 59 | Admitting: Family

## 2023-04-09 LAB — URINE CULTURE
MICRO NUMBER:: 15860684
SPECIMEN QUALITY:: ADEQUATE

## 2023-04-09 NOTE — Progress Notes (Signed)
Call pt as she is going to need to take three meds I don't want her to be confused.   She has Bacterial vaginitis (infection of her vagina), yeast infection AND a urinary tract infection. I already sent her in flagyl and diflucan however she will also need to take an antibiotic (bactrim). Advise her to take with food and could benefit from a probiotic. Have her schedule f/u with me in two weeks to ensure resolution.

## 2023-04-09 NOTE — Telephone Encounter (Addendum)
Chief Complaint: left pelvis/thigh swelling and pain  Symptoms: left pelvic/inner thigh pain and swelling  Frequency: x 2 weeks  Pertinent Negatives: Patient denies fevers, changes in urination, vaginal discharge or bleeding, abdominal pain, nausea or vomiting  Disposition: [] ED /[] Urgent Care (no appt availability in office) / [] Appointment(In office/virtual)/ []  [facility]/ [x] Home Care/ [] Refused Recommended Disposition /[] [place] [facility]/ []  Follow-up with PCP Additional Notes: Patient seen on 12/17 for pelvic pain. Pt states she has not started her medications (flagyl and diflucan); states they are ready to be picked up at the pharmacy today. Instructed patient to continue warm compresses, ibuprofen, and to pick up her prescriptions and begin taking them. Pt states pain does improve with warm compress and ibuprofen. Informed patient there are appts available in office tomorrow if needed. Pt agreeable to home care advice and states she will start her prescriptions. Pt verbalizes understanding of concerning symptoms and when to call back.   Copied from CRM [id number]. Topic: Clinical - Red Word Triage >> Apr 09, 2023  9:29 AM Almira Coaster wrote: Red Word that prompted transfer to Nurse Triage: Left pelvic area swollen and severe pain. Reason for Disposition  [1] MILD-MODERATE pain AND [2] constant AND [3] present > 2 hours  Answer Assessment - Initial Assessment Questions 1. LOCATION: "Where does it hurt?"      Left pelvis/inner thigh pain.  2. RADIATION: "Does the pain shoot anywhere else?" (e.g., lower back, groin, thighs)     Radiates to inner thigh.  3. ONSET: "When did the pain begin?" (e.g., minutes, hours or days ago)      X 2 weeks; worsened since Saturday  4. SUDDEN: "Gradual or sudden onset?"     Sudden.  5. PATTERN "Does the pain come and go, or is it constant?"    - If constant: "Is it getting better, staying the same, or worsening?"      (Note: Constant  means the pain never goes away completely; most serious pain is constant and gets worse over time)     - If intermittent: "How long does it last?" "Do you have pain now?"     (Note: Intermittent means the pain goes away completely between bouts)     Pt states there is a constant pain but it worsens with movement.  6. SEVERITY: "How bad is the pain?"  (e.g., Scale 1-10; mild, moderate, or severe)   - MILD (1-3): doesn't interfere with normal activities, area soft and not tender to touch    - MODERATE (4-7): interferes with normal activities or awakens from sleep, abdomen tender to touch    - SEVERE (8-10): excruciating pain, doubled over, unable to do any normal activities      7/10. 7. RECURRENT SYMPTOM: "Have you ever had this type of pelvic pain before?" If Yes, ask: "When was the last time?" and "What happened that time?"      Pt states this has not happened before.  8. CAUSE: "What do you think is causing the pelvic pain?"     Pt was seen this week by PCP and was told it might be a cyst.  9. RELIEVING/AGGRAVATING FACTORS: "What makes it better or worse?" (e.g., activity/rest, sexual intercourse, voiding, passing stool)     Worsens with activity. Pt states pain improves with ibuprofen.  10. OTHER SYMPTOMS: "Has there been any other symptoms?" (e.g., fever, constipation, diarrhea, urine problems, vaginal bleeding, vaginal discharge, or vomiting?"       Denies other symptoms.  11. PREGNANCY: "Is there any chance you are pregnant?" "When was your last menstrual period?"       Pt denies pregnancy, states she had a hysterectomy.  Protocols used: Pelvic Pain - Female-A-AH

## 2023-04-23 ENCOUNTER — Encounter: Payer: Self-pay | Admitting: Family

## 2023-04-23 ENCOUNTER — Ambulatory Visit: Payer: Medicaid Other | Admitting: Family

## 2023-04-23 VITALS — BP 122/82 | HR 76 | Temp 98.2°F | Ht 71.0 in | Wt 230.8 lb

## 2023-04-23 DIAGNOSIS — R311 Benign essential microscopic hematuria: Secondary | ICD-10-CM | POA: Diagnosis not present

## 2023-04-23 DIAGNOSIS — M5432 Sciatica, left side: Secondary | ICD-10-CM

## 2023-04-23 DIAGNOSIS — R200 Anesthesia of skin: Secondary | ICD-10-CM | POA: Insufficient documentation

## 2023-04-23 NOTE — Progress Notes (Signed)
 Established Patient Office Visit  Subjective:      CC:  Chief Complaint  Patient presents with   Medical Management of Chronic Issues    HPI: Holly Brown is a 38 y.o. female presenting on 04/23/2023 for Medical Management of Chronic Issues  BV, UTI and yeast 04/07/23, treated with diflucan , bactrim , and also flagyl . She states the lymph node is resolving in her left inguinal area and no longer tender. No vaginal discharge, states that symptoms have resolved.   Left arm numbness, comes and goes. Last episode with complete numbness was about two weeks ago. Was scheduled with neurologist for December but they called her to reschedule the appt and now not until march for consult. When the episodes do occur she is completely unable to move her arm.   Left sided sciatica, worsened post op when she was sitting in the bed for longer periods of time. And when she steps causes some nerve pain, and her leg will feel numb and hard to move her leg but then will walk some more and there is improvement. Now taking flexeril  prn which helps at times.     Social history:  Relevant past medical, surgical, family and social history reviewed and updated as indicated. Interim medical history since our last visit reviewed.  Allergies and medications reviewed and updated.  DATA REVIEWED: CHART IN EPIC     ROS: Negative unless specifically indicated above in HPI.    Current Outpatient Medications:    Multiple Vitamins-Minerals (HAIR SKIN AND NAILS FORMULA PO), Take 1 tablet by mouth daily., Disp: , Rfl:    pantoprazole  (PROTONIX ) 40 MG tablet, Take 1 tablet (40 mg total) by mouth daily., Disp: 30 tablet, Rfl: 1      Objective:    BP 122/82 (BP Location: Left Arm, Patient Position: Sitting, Cuff Size: Large)   Pulse 76   Temp 98.2 F (36.8 C) (Temporal)   Ht 5' 11 (1.803 m)   Wt 230 lb 12.8 oz (104.7 kg)   LMP  (LMP Unknown) Comment: Depo Injections - last injection 10/2022, had  spotting on and off since 10/2022 injection  SpO2 100%   BMI 32.19 kg/m   Wt Readings from Last 3 Encounters:  04/23/23 230 lb 12.8 oz (104.7 kg)  04/07/23 225 lb 6.4 oz (102.2 kg)  03/06/23 225 lb (102.1 kg)    Physical Exam Constitutional:      General: She is not in acute distress.    Appearance: Normal appearance. She is normal weight. She is not ill-appearing, toxic-appearing or diaphoretic.  HENT:     Head: Normocephalic.  Cardiovascular:     Rate and Rhythm: Normal rate.  Pulmonary:     Effort: Pulmonary effort is normal.  Musculoskeletal:        General: Normal range of motion.  Lymphadenopathy:     Lower Body: Left inguinal adenopathy present.  Neurological:     General: No focal deficit present.     Mental Status: She is alert and oriented to person, place, and time. Mental status is at baseline.  Psychiatric:        Mood and Affect: Mood normal.        Behavior: Behavior normal.        Thought Content: Thought content normal.        Judgment: Judgment normal.           Assessment & Plan:  Benign essential microscopic hematuria Assessment & Plan: Repeat urine today pending results  Just to verify resolution asymptomatic  Orders: -     Urinalysis w microscopic + reflex cultur  Left sided sciatica Assessment & Plan: Advised to work on exercises, printed and given to pt.  Heat to site, ibuprofen  tylenol  when flaring, muscle relaxer prn.  Can consider physical therapy if no improvement.   Left arm numbness Assessment & Plan: Intermittent flares.  Pt to call around to see if any neurology can be seen any earlier than march, she will advise me if referral needs to be changed.       Return for as scheduled.  Ginger Patrick, MSN, APRN, FNP-C [facility]

## 2023-04-23 NOTE — Assessment & Plan Note (Signed)
 Intermittent flares.  Pt to call around to see if any neurology can be seen any earlier than march, she will advise me if referral needs to be changed.

## 2023-04-23 NOTE — Assessment & Plan Note (Signed)
 Advised to work on exercises, printed and given to pt.  Heat to site, ibuprofen tylenol when flaring, muscle relaxer prn.  Can consider physical therapy if no improvement.

## 2023-04-23 NOTE — Assessment & Plan Note (Signed)
 Repeat urine today pending results  Just to verify resolution asymptomatic

## 2023-04-25 LAB — URINALYSIS W MICROSCOPIC + REFLEX CULTURE
Bilirubin Urine: NEGATIVE
Glucose, UA: NEGATIVE
Hyaline Cast: NONE SEEN /LPF
Ketones, ur: NEGATIVE
Nitrites, Initial: POSITIVE — AB
Protein, ur: NEGATIVE
RBC / HPF: NONE SEEN /[HPF] (ref 0–2)
Specific Gravity, Urine: 1.018 (ref 1.001–1.035)
pH: 5.5 (ref 5.0–8.0)

## 2023-04-25 LAB — URINE CULTURE
MICRO NUMBER:: 15912011
SPECIMEN QUALITY:: ADEQUATE

## 2023-04-25 LAB — CULTURE INDICATED

## 2023-04-26 ENCOUNTER — Other Ambulatory Visit: Payer: Self-pay | Admitting: Family

## 2023-04-26 DIAGNOSIS — N3000 Acute cystitis without hematuria: Secondary | ICD-10-CM

## 2023-04-26 MED ORDER — NITROFURANTOIN MONOHYD MACRO 100 MG PO CAPS
100.0000 mg | ORAL_CAPSULE | Freq: Two times a day (BID) | ORAL | 0 refills | Status: AC
  Filled 2023-04-26: qty 10, 5d supply, fill #0

## 2023-04-27 ENCOUNTER — Other Ambulatory Visit (HOSPITAL_COMMUNITY): Payer: Self-pay

## 2023-04-27 ENCOUNTER — Other Ambulatory Visit: Payer: Self-pay

## 2023-04-28 ENCOUNTER — Encounter: Payer: Self-pay | Admitting: Family

## 2023-04-28 ENCOUNTER — Other Ambulatory Visit (HOSPITAL_COMMUNITY): Payer: Self-pay

## 2023-05-08 ENCOUNTER — Encounter: Payer: Self-pay | Admitting: Family

## 2023-05-08 ENCOUNTER — Ambulatory Visit (INDEPENDENT_AMBULATORY_CARE_PROVIDER_SITE_OTHER): Payer: Medicaid Other | Admitting: Family

## 2023-05-08 VITALS — BP 104/72 | HR 88 | Temp 98.2°F | Ht 71.0 in | Wt 229.2 lb

## 2023-05-08 DIAGNOSIS — Z0000 Encounter for general adult medical examination without abnormal findings: Secondary | ICD-10-CM | POA: Diagnosis not present

## 2023-05-08 DIAGNOSIS — Z90710 Acquired absence of both cervix and uterus: Secondary | ICD-10-CM | POA: Diagnosis not present

## 2023-05-08 DIAGNOSIS — R29898 Other symptoms and signs involving the musculoskeletal system: Secondary | ICD-10-CM | POA: Insufficient documentation

## 2023-05-08 DIAGNOSIS — Z87410 Personal history of cervical dysplasia: Secondary | ICD-10-CM | POA: Diagnosis not present

## 2023-05-08 NOTE — Assessment & Plan Note (Signed)
Cont f/u as scheduled with gynecology oncology  Referral placed for gynecology as her prior gyn has retired

## 2023-05-08 NOTE — Patient Instructions (Signed)
  Stop by the lab prior to leaving today. I will notify you of your results once received.   Recommendations on keeping yourself healthy:  - Exercise at least 30-45 minutes a day, 3-4 days a week.  - Eat a low-fat diet with lots of fruits and vegetables, up to 7-9 servings per day.  - Seatbelts can save your life. Wear them always.  - Smoke detectors on every level of your home, check batteries every year.  - Eye Doctor - have an eye exam every 1-2 years  - Safe sex - if you may be exposed to STDs, use a condom.  - Alcohol -  If you drink, do it moderately, less than 2 drinks per day.  - Health Care Power of Attorney. Choose someone to speak for you if you are not able.  - Depression is common in our stressful world.If you're feeling down or losing interest in things you normally enjoy, please come in for a visit.  - Violence - If anyone is threatening or hurting you, please call immediately.  Due to recent changes in healthcare laws, you may see results of your imaging and/or laboratory studies on MyChart before I have had a chance to review them.  I understand that in some cases there may be results that are confusing or concerning to you. Please understand that not all results are received at the same time and often I may need to interpret multiple results in order to provide you with the best plan of care or course of treatment. Therefore, I ask that you please give me 2 business days to thoroughly review all your results before contacting my office for clarification. Should we see a critical lab result, you will be contacted sooner.   I will see you again in one year for your annual comprehensive exam unless otherwise stated and or with acute concerns.  It was a pleasure seeing you today! Please do not hesitate to reach out with any questions and or concerns.  Regards,   Yarah Fuente    

## 2023-05-08 NOTE — Progress Notes (Signed)
Subjective:  Patient ID: Holly Brown, female    DOB: 1985/05/26  Age: 38 y.o. MRN: [id number]  Patient Care Team: Mort Sawyers, FNP as PCP - General (Family Medicine)   CC:  Chief Complaint  Patient presents with   Medical Management of Chronic Issues    HPI Holly Brown is a 37 y.o. female who presents today for an annual physical exam. She reports consuming a general diet. The patient does not participate in regular exercise at present. She generally feels well. She reports sleeping fairly well. She does not have additional problems to discuss today. She at times struggles with falling to sleep.   Vision:Within last year Dental:Receives regular dental care  Last pap: total vaginal hysterectomy, had cervical dysplasi and AUB. 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 Still following with GYN. She is currently have pelvic exam every 6 months.   Pt is without acute concerns.   Advanced Directives Patient does not have advanced directives   DEPRESSION SCREENING    05/08/2023    8:41 AM 04/07/2023    8:16 AM 12/10/2022    9:35 AM  PHQ 2/9 Scores  PHQ - 2 Score 2 2 0  PHQ- 9 Score 5 3      ROS: Negative unless specifically indicated above in HPI.    Current Outpatient Medications:    Multiple Vitamins-Minerals (HAIR SKIN AND NAILS FORMULA PO), Take 1 tablet by mouth daily., Disp: , Rfl:    pantoprazole (PROTONIX) 40 MG tablet, Take 1 tablet (40 mg total) by mouth daily., Disp: 30 tablet, Rfl: 1    Objective:    BP 104/72 (BP Location: Right Arm, Patient Position: Sitting, Cuff Size: Normal)   Pulse 88   Temp 98.2 F (36.8 C) (Temporal)   Ht 5\' 11"  (1.803 m)   Wt 229 lb 3.2 oz (104 kg)   LMP  (LMP Unknown) Comment: Depo Injections - last injection 10/2022, had spotting on and off since 10/2022 injection  SpO2 98%   BMI 31.97 kg/m   BP Readings from Last 3 Encounters:  05/08/23 104/72  04/23/23 122/82  04/07/23  106/68      Physical Exam Constitutional:      General: She is not in acute distress.    Appearance: Normal appearance. She is normal weight. She is not ill-appearing.  HENT:     Head: Normocephalic.     Right Ear: Tympanic membrane normal.     Left Ear: Tympanic membrane normal.     Nose: Nose normal.     Mouth/Throat:     Mouth: Mucous membranes are moist.  Eyes:     Extraocular Movements: Extraocular movements intact.     Pupils: Pupils are equal, round, and reactive to light.  Cardiovascular:     Rate and Rhythm: Normal rate and regular rhythm.  Pulmonary:     Effort: Pulmonary effort is normal.     Breath sounds: Normal breath sounds.  Abdominal:     General: Abdomen is flat. Bowel sounds are normal.     Palpations: Abdomen is soft.     Tenderness: There is no guarding or rebound.  Musculoskeletal:        General: Normal range of motion.     Cervical back: Normal range of motion.  Skin:    General: Skin is warm.     Capillary Refill: Capillary refill takes less than 2 seconds.  Neurological:  General: No focal deficit present.     Mental Status: She is alert.  Psychiatric:        Mood and Affect: Mood normal.        Behavior: Behavior normal.        Thought Content: Thought content normal.        Judgment: Judgment normal.          Assessment & Plan:  Encounter for general adult medical examination without abnormal findings Assessment & Plan: Patient Counseling(The following topics were reviewed):  Preventative care handout given to pt  Health maintenance and immunizations reviewed. Please refer to Health maintenance section. Pt advised on safe sex, wearing seatbelts in car, and proper nutrition labwork ordered today for annual Dental health: Discussed importance of regular tooth brushing, flossing, and dental visits.    History of cervical dysplasia Assessment & Plan: Cont f/u as scheduled with gynecology oncology  Referral placed for gynecology  as her prior gyn has retired  Orders: -     Ambulatory referral to Gynecology  S/P hysterectomy -     Ambulatory referral to Gynecology  Left arm weakness Assessment & Plan: Periods of numbness, decreasing slightly in frequency but still ongoing.  Pt setting up appt with neurology for work up        Follow-up: Return in about 1 year (around 05/07/2024) for f/u CPE.   Mort Sawyers, FNP

## 2023-05-08 NOTE — Assessment & Plan Note (Signed)

## 2023-05-08 NOTE — Assessment & Plan Note (Signed)
Periods of numbness, decreasing slightly in frequency but still ongoing.  Pt setting up appt with neurology for work up

## 2023-06-18 ENCOUNTER — Ambulatory Visit: Payer: Self-pay | Admitting: Family

## 2023-06-18 NOTE — Telephone Encounter (Signed)
 Copied From CRM [id number]. Reason for Triage: Patient used soap that is causing her to have allergic reaction in groin area-please call

## 2023-06-18 NOTE — Telephone Encounter (Signed)
 LM for pt to returncall

## 2023-06-18 NOTE — Telephone Encounter (Signed)
 I spoke with pt; on 06/13/23 pt used different body wash x 1. Since Sat pt has gone back to usual body wash but pt still having vaginal and perineal itching and irritation; pt said had vaginal discharge only 1 day and no vaginal discharge now. Last weekend did have burning upon urination for 2 days but that has subsided now also. Pt said on and off has mid lower abd cramping but no pain. No difficulty breathing and no swelling in mouth or throat; pt has no rash or irritation anywhere else. Offered pt appt for 06/19/23 in AM with T Dugal NP but pt said she was working now and cannot come in until after 3 PM. Pt scheduled appt with Dr Ermalene Searing 07/17/23 at 3:20 with UC & ED precautions.pt will talk with front office about switching PCP due to scheduling. Sending note to T Alfonse Alpers FNP and Dr Ermalene Searing .

## 2023-06-18 NOTE — Telephone Encounter (Signed)
 Chief Complaint: Allergic reaction   Disposition: [] ED /[] Urgent Care (no appt availability in office) / [] Appointment(In office/virtual)/ []  [place]/ [] Home Care/ [] Refused Recommended Disposition /[] [facility]/ []  Follow-up with PCP Additional Notes: Attempted to contact patient x 3, no answer. Forwarding to office at this time.  Copied From CRM [id number]. Reason for Triage: Patient used soap that is causing her to have allergic reaction in groin area-please call    Reason for Disposition  Third attempt to contact caller AND no contact made. Phone number verified.  Answer Assessment - Initial Assessment Questions Attempted to contact patient x3. No answer  Protocols used: No Contact or Duplicate Contact Call-A-AH

## 2023-06-18 NOTE — Telephone Encounter (Signed)
 Noted.

## 2023-06-18 NOTE — Telephone Encounter (Signed)
 Noted.  Thank you dr Ermalene Searing for seeing this pt.   In future if she does request toc I am ok with this due to my limited afternoon schedule.

## 2023-06-19 ENCOUNTER — Ambulatory Visit (INDEPENDENT_AMBULATORY_CARE_PROVIDER_SITE_OTHER): Payer: Commercial Managed Care - PPO | Admitting: Family Medicine

## 2023-06-19 ENCOUNTER — Encounter: Payer: Self-pay | Admitting: Family Medicine

## 2023-06-19 ENCOUNTER — Other Ambulatory Visit (HOSPITAL_COMMUNITY): Payer: Self-pay

## 2023-06-19 VITALS — BP 102/60 | HR 78 | Temp 97.8°F | Ht 71.0 in | Wt 234.4 lb

## 2023-06-19 DIAGNOSIS — N898 Other specified noninflammatory disorders of vagina: Secondary | ICD-10-CM

## 2023-06-19 NOTE — Progress Notes (Signed)
 Patient ID: Holly Brown, female    DOB: [birth year], 38 y.o.   MRN: [id number]  This visit was conducted in person.  BP 102/60 (BP Location: Left Arm, Patient Position: Sitting, Cuff Size: Large)   Pulse 78   Temp 97.8 F (36.6 C) (Temporal)   Ht 5\' 11"  (1.803 m)   Wt 234 lb 6 oz (106.3 kg)   LMP  (LMP Unknown) Comment: Depo Injections - last injection 10/2022, had spotting on and off since 10/2022 injection  SpO2 99%   BMI 32.69 kg/m    CC:  Chief Complaint  Patient presents with   Vaginal Itching    See Triage Note from yesterday       HPI: Holly Brown is a 38 y.o. female presenting on 06/19/2023 for Vaginal Itching (See Triage Note from yesterday)   Reviewed nurse triage from June 18, 2023. Patient reported  starting February 22 she used a different body wash x 1 ( has returned to nml wash) Since then she has noted vaginal and perineal itching and irritation.  Only 1 episode of small amount of vaginal discharge... yellowish thick mucusy. None further She has noted intermittent burning upon urination ( at vaginal tissue not dysuria) about 5 days ago but this has resolved now. She notes mild intermittent lower abdominal cramping but no pain.... this more of a chornic issue.  No diarrhea, no constipation.   Noted positive urine culture April 23, 2023 for ESBL E. Coli..  Treated with Bactrim.   HX of partial hysterectomy 11/2022 had cervical cancer   History of HSV.Marland Kitchen non in last  2 years  Relevant past medical, surgical, family and social history reviewed and updated as indicated. Interim medical history since our last visit reviewed. Allergies and medications reviewed and updated. Outpatient Medications Prior to Visit  Medication Sig Dispense Refill   Multiple Vitamins-Minerals (HAIR SKIN AND NAILS FORMULA PO) Take 1 tablet by mouth daily.     pantoprazole (PROTONIX) 40 MG tablet Take 1 tablet (40 mg total) by mouth daily. 30 tablet 1   No  facility-administered medications prior to visit.     Per HPI unless specifically indicated in ROS section below Review of Systems  Constitutional:  Negative for fatigue and fever.  HENT:  Negative for congestion.   Eyes:  Negative for pain.  Respiratory:  Negative for cough and shortness of breath.   Cardiovascular:  Negative for chest pain, palpitations and leg swelling.  Gastrointestinal:  Negative for abdominal pain.  Genitourinary:  Negative for dysuria and vaginal bleeding.  Musculoskeletal:  Negative for back pain.  Neurological:  Negative for syncope, light-headedness and headaches.  Psychiatric/Behavioral:  Negative for dysphoric mood.    Objective:  BP 102/60 (BP Location: Left Arm, Patient Position: Sitting, Cuff Size: Large)   Pulse 78   Temp 97.8 F (36.6 C) (Temporal)   Ht 5\' 11"  (1.803 m)   Wt 234 lb 6 oz (106.3 kg)   LMP  (LMP Unknown) Comment: Depo Injections - last injection 10/2022, had spotting on and off since 10/2022 injection  SpO2 99%   BMI 32.69 kg/m   Wt Readings from Last 3 Encounters:  06/19/23 234 lb 6 oz (106.3 kg)  05/08/23 229 lb 3.2 oz (104 kg)  04/23/23 230 lb 12.8 oz (104.7 kg)      Physical Exam Exam conducted with a chaperone present.  Constitutional:      General: She is not in acute distress.    Appearance:  Normal appearance. She is well-developed. She is not ill-appearing or toxic-appearing.  HENT:     Head: Normocephalic.     Right Ear: Hearing, tympanic membrane, ear canal and external ear normal. Tympanic membrane is not erythematous, retracted or bulging.     Left Ear: Hearing, tympanic membrane, ear canal and external ear normal. Tympanic membrane is not erythematous, retracted or bulging.     Nose: No mucosal edema or rhinorrhea.     Right Sinus: No maxillary sinus tenderness or frontal sinus tenderness.     Left Sinus: No maxillary sinus tenderness or frontal sinus tenderness.     Mouth/Throat:     Pharynx: Uvula midline.   Eyes:     General: Lids are normal. Lids are everted, no foreign bodies appreciated.     Conjunctiva/sclera: Conjunctivae normal.     Pupils: Pupils are equal, round, and reactive to light.  Neck:     Thyroid: No thyroid mass or thyromegaly.     Vascular: No carotid bruit.     Trachea: Trachea normal.  Cardiovascular:     Rate and Rhythm: Normal rate and regular rhythm.     Pulses: Normal pulses.     Heart sounds: Normal heart sounds, S1 normal and S2 normal. No murmur heard.    No friction rub. No gallop.  Pulmonary:     Effort: Pulmonary effort is normal. No tachypnea or respiratory distress.     Breath sounds: Normal breath sounds. No decreased breath sounds, wheezing, rhonchi or rales.  Abdominal:     General: Bowel sounds are normal.     Palpations: Abdomen is soft.     Tenderness: There is no abdominal tenderness.  Genitourinary:    Exam position: Knee-chest position.     Pubic Area: No rash or pubic lice.      Labia:        Right: No rash, tenderness, lesion or injury.        Left: No tenderness, lesion or injury.      Rectum: Normal.  Musculoskeletal:     Cervical back: Normal range of motion and neck supple.  Skin:    General: Skin is warm and dry.     Findings: No rash.  Neurological:     Mental Status: She is alert.  Psychiatric:        Mood and Affect: Mood is not anxious or depressed.        Speech: Speech normal.        Behavior: Behavior normal. Behavior is cooperative.        Thought Content: Thought content normal.        Judgment: Judgment normal.       Results for orders placed or performed in visit on 04/23/23  Urine Culture   Collection Time: 04/23/23  8:36 AM  Result Value Ref Range   MICRO NUMBER: [id number]    SPECIMEN QUALITY: Adequate    Sample Source URINE    STATUS: FINAL    ISOLATE 1: ESBL Escherichia coli (A)       Susceptibility   Esbl escherichia coli - URINE CULTURE, REFLEX    AMOX/CLAVULANIC <=2 Sensitive     AMPICILLIN* <=2  Sensitive      * Extended spectrum beta-lactamase (ESBL) producing organisms demonstrate decreased activity with penicillins, cephalosporins and aztreonam.     AMPICILLIN/SULBACTAM <=2 Sensitive     CEFAZOLIN* <=4 Not Reportable      * Extended spectrum beta-lactamase (ESBL) producing organisms demonstrate decreased activity with penicillins,  cephalosporins and aztreonam. For infections other than uncomplicated UTI caused by E. coli, K. pneumoniae or P. mirabilis: Cefazolin is resistant if MIC > or = 8 mcg/mL. (Distinguishing susceptible versus intermediate for isolates with MIC < or = 4 mcg/mL requires additional testing.) For uncomplicated UTI caused by E. coli, K. pneumoniae or P. mirabilis: Cefazolin is susceptible if MIC <32 mcg/mL and predicts susceptible to the oral agents cefaclor, cefdinir, cefpodoxime, cefprozil, cefuroxime, cephalexin and loracarbef.     CEFTAZIDIME <=1 Sensitive     CEFEPIME <=1 Sensitive     CEFTRIAXONE <=1 Sensitive     CIPROFLOXACIN <=0.25 Sensitive     LEVOFLOXACIN <=0.12 Sensitive     GENTAMICIN <=1 Sensitive     IMIPENEM <=0.25 Sensitive     NITROFURANTOIN <=16 Sensitive     PIP/TAZO <=4 Sensitive     TOBRAMYCIN <=1 Sensitive     TRIMETH/SULFA* <=20 Sensitive      * Extended spectrum beta-lactamase (ESBL) producing organisms demonstrate decreased activity with penicillins, cephalosporins and aztreonam. For infections other than uncomplicated UTI caused by E. coli, K. pneumoniae or P. mirabilis: Cefazolin is resistant if MIC > or = 8 mcg/mL. (Distinguishing susceptible versus intermediate for isolates with MIC < or = 4 mcg/mL requires additional testing.) For uncomplicated UTI caused by E. coli, K. pneumoniae or P. mirabilis: Cefazolin is susceptible if MIC <32 mcg/mL and predicts susceptible to the oral agents cefaclor, cefdinir, cefpodoxime, cefprozil, cefuroxime, cephalexin and loracarbef. Legend: S = Susceptible  I =  Intermediate R = Resistant  NS = Not susceptible SDD = Susceptible Dose Dependent * = Not Tested  NR = Not Reported **NN = See Therapy Comments   Urinalysis w microscopic + reflex cultur   Collection Time: 04/23/23  8:36 AM   Specimen: Urine  Result Value Ref Range   Color, Urine YELLOW YELLOW   APPearance CLEAR CLEAR   Specific Gravity, Urine 1.018 1.001 - 1.035   pH 5.5 5.0 - 8.0   Glucose, UA NEGATIVE NEGATIVE   Bilirubin Urine NEGATIVE NEGATIVE   Ketones, ur NEGATIVE NEGATIVE   Hgb urine dipstick TRACE (A) NEGATIVE   Protein, ur NEGATIVE NEGATIVE   Nitrites, Initial POSITIVE (A) NEGATIVE   Leukocyte Esterase 3+ (A) NEGATIVE   WBC, UA 20-40 (A) 0 - 5 /HPF   RBC / HPF NONE SEEN 0 - 2 /HPF   Squamous Epithelial / HPF 0-5 < OR = 5 /HPF   Bacteria, UA MANY (A) NONE SEEN /HPF   Hyaline Cast NONE SEEN NONE SEEN /LPF   Note    REFLEXIVE URINE CULTURE   Collection Time: 04/23/23  8:36 AM  Result Value Ref Range   REFLEXIVE URINE CULTURE      Assessment and Plan  There are no diagnoses linked to this encounter.  No follow-ups on file.   Kerby Nora, MD

## 2023-06-19 NOTE — Assessment & Plan Note (Signed)
 Acute, on exam no suggestion of HSV ulcer, minimal discharge. Will send wet prep for evaluation for BV, Candida and trichomonas. Most likely contact dermatitis, now resolving. Recommend washing with warm water only vaginally and avoiding irritants.  Return and ER precautions provided.

## 2023-06-22 LAB — WET PREP BY MOLECULAR PROBE
Candida species: DETECTED — AB
Gardnerella vaginalis: NOT DETECTED
MICRO NUMBER:: 16144575
SPECIMEN QUALITY:: ADEQUATE
Trichomonas vaginosis: NOT DETECTED

## 2023-06-23 ENCOUNTER — Other Ambulatory Visit (HOSPITAL_COMMUNITY): Payer: Self-pay

## 2023-06-23 ENCOUNTER — Encounter: Payer: Self-pay | Admitting: Family Medicine

## 2023-06-23 ENCOUNTER — Other Ambulatory Visit: Payer: Self-pay | Admitting: Family Medicine

## 2023-06-23 MED ORDER — FLUCONAZOLE 150 MG PO TABS
150.0000 mg | ORAL_TABLET | Freq: Once | ORAL | 0 refills | Status: AC
Start: 2023-06-23 — End: 2023-06-25
  Filled 2023-06-23: qty 2, 2d supply, fill #0

## 2023-07-23 ENCOUNTER — Ambulatory Visit: Payer: Self-pay

## 2023-07-23 NOTE — Telephone Encounter (Signed)
 Agree with precautions given to pt  Agree with nurse assessment in plan.  Thank you for speaking with them.  Thank you for seeing her tomorrow Dr. Milinda Antis.

## 2023-07-23 NOTE — Telephone Encounter (Signed)
  Chief Complaint: "hemorrhoids" and lower abdominal pain Symptoms: lower abdominal cramping, vomiting and diarrhea (resolved), rectal bleeding (streak of red blood on toilet tissue) Frequency: intermittent, worsened over the past 2 weeks Pertinent Negatives: Patient denies fever,  constipation, dizziness/lightheaded, vaginal discharge/bleeding Disposition: [] ED /[] Urgent Care (no appt availability in office) / [x] Appointment(In office/virtual)/ []  [facility]/ [] Home Care/ [] Refused Recommended Disposition /[] [facility]/ []  Follow-up with PCP Additional Notes: Patient states she had her second surgery for cervical cancer back in October. She states since the surgery she has had an intermittent severe lower abdominal cramping. She states she has been having to take 800mg  ibuprofen for the past 2 weeks due to worsening of abdominal pain. Patient states on Saturday when she woke up she had vomiting and diarrhea and states it lasted 24 hours. She states she also noticed hemorrhoids on Tuesday (07/21/23). This morning she states there was a streak of red blood on toilet tissue. Patient offered appointments with her PCP tomorrow but states she can only do the afternoon as she works until [employer]. Patient agreeable to get off work early tomorrow for 3:30pm appt with Dr Milinda Antis.  Copied from CRM [id number]. Topic: Clinical - Red Word Triage >> Jul 23, 2023  7:59 AM Pascal Lux wrote: Red Word that prompted transfer to Nurse Triage: Patient stated she is experiencing some pain and does not know where exactly it's coming from and requesting to speak to a nurse. Reason for Disposition  NO doctor (or NP/PA) examination for rectal bleeding in past year  Answer Assessment - Initial Assessment Questions 1. APPEARANCE of BLOOD: "What color is it?" "Is it passed separately, on the surface of the stool, or mixed in with the stool?"      Bright red blood on toilet tissue, unsure if there was any in her  stool.  2. AMOUNT: "How much blood was passed?"      Size of a penny.  3. FREQUENCY: "How many times has blood been passed with the stools?"      Once.  4. ONSET: "When was the blood first seen in the stools?" (Days or weeks)      This morning.  5. DIARRHEA: "Is there also some diarrhea?" If Yes, ask: "How many diarrhea stools in the past 24 hours?"      Saturday 3/29, episodes of vomiting and diarrhea. Resolved since.  6. CONSTIPATION: "Do you have constipation?" If Yes, ask: "How bad is it?"     Denies.  7. RECURRENT SYMPTOMS: "Have you had blood in your stools before?" If Yes, ask: "When was the last time?" and "What happened that time?"      Denies.  8. BLOOD THINNERS: "Do you take any blood thinners?" (e.g., Coumadin/warfarin, Pradaxa/dabigatran, aspirin)     Denies.  9. OTHER SYMPTOMS: "Do you have any other symptoms?"  (e.g., abdomen pain, vomiting, dizziness, fever)     Lower abdominal cramping present now 7/10 (intermittent), improves after use of heating pad.  10. PREGNANCY: "Is there any chance you are pregnant?" "When was your last menstrual period?"       She states she does not have periods anymore.  Protocols used: Rectal Bleeding-A-AH

## 2023-07-24 ENCOUNTER — Ambulatory Visit (INDEPENDENT_AMBULATORY_CARE_PROVIDER_SITE_OTHER): Admitting: Family Medicine

## 2023-07-24 ENCOUNTER — Encounter: Payer: Self-pay | Admitting: Family Medicine

## 2023-07-24 ENCOUNTER — Ambulatory Visit: Admitting: Family Medicine

## 2023-07-24 VITALS — BP 112/68 | HR 75 | Temp 98.5°F | Ht 71.0 in | Wt 234.0 lb

## 2023-07-24 DIAGNOSIS — R112 Nausea with vomiting, unspecified: Secondary | ICD-10-CM | POA: Diagnosis not present

## 2023-07-24 DIAGNOSIS — K625 Hemorrhage of anus and rectum: Secondary | ICD-10-CM | POA: Diagnosis not present

## 2023-07-24 DIAGNOSIS — K269 Duodenal ulcer, unspecified as acute or chronic, without hemorrhage or perforation: Secondary | ICD-10-CM

## 2023-07-24 DIAGNOSIS — R1030 Lower abdominal pain, unspecified: Secondary | ICD-10-CM | POA: Diagnosis not present

## 2023-07-24 DIAGNOSIS — R1110 Vomiting, unspecified: Secondary | ICD-10-CM | POA: Insufficient documentation

## 2023-07-24 NOTE — Assessment & Plan Note (Signed)
 Scant on wiping In setting of hemorrhoids Reassuring exam but today heme card is trace positive   Has fam history of colon cancer   Lab today  Refer to GI

## 2023-07-24 NOTE — Assessment & Plan Note (Signed)
 Pt has used ibuprofen recently for abd cramping since gyn surgery in fall   Instructed to stop nsaids  This may have caused her vomiting  Reassuring exam today  Lab today

## 2023-07-24 NOTE — Progress Notes (Signed)
 Subjective:    Patient ID: Holly Brown, female    DOB: Feb 25, 1986, 38 y.o.   MRN: [id number]  HPI  Wt Readings from Last 3 Encounters:  07/24/23 234 lb (106.1 kg)  06/19/23 234 lb 6 oz (106.3 kg)  05/08/23 229 lb 3.2 oz (104 kg)   32.64 kg/m  Vitals:   07/24/23 1518  BP: 112/68  Pulse: 75  Temp: 98.5 F (36.9 C)  SpO2: 99%    38 yo pt of NP Dugal presents with rectal bleeding Some abd cramping  Hemorrhoids    Had 2nd surg for cervical cancer in the fall Since then intermittent low abd cramping  Feels kind of like period cramps and it is cyclic   (did tell her gyn about it)  (Still has ovaries)  Saturday was the only time she had stool change   Has internal/external hemorrhoid  Can feel when se wipes  It got bigger and tender when she wiped  Is much improved now   Used a cool cloth and ice pack    Hyst was in August  Pelvic lymph node dissection in oct  Then told cancer was gone  No rad or chemo  Every 6 months with gyn onc     Saturday woke up with vomiting and diarrhea (out of nowhere) - no fever / just happened once  Had taken ibuprofen  (has history of duod ulcer)  Tuesday had streak of red blood on toilet tissue one time   Pt had hysterectomy in past / history of cervical cancer   Family history of colon cancer -in father with 35s    Last colonoscopy 2018- was not satisfactory due to poor prep  Needs to get set up for that  Needs a referral      Patient Active Problem List   Diagnosis Date Noted   Lower abdominal pain 07/24/2023   Rectal bleed 07/24/2023   Vomiting 07/24/2023   Left arm weakness 05/08/2023   History of cervical dysplasia 05/08/2023   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   Left sided sciatica 04/23/2023   Low magnesium level 04/07/2023   Vaginal irritation 01/28/2023   Cervical cancer (HCC) 12/29/2022   Duodenal ulcer 12/24/2022   S/P hysterectomy 12/19/2022   History of syncope 12/10/2022    Chronic pain of left knee 12/10/2022   Family history of colon cancer 10/10/2015   Past Medical History:  Diagnosis Date   Anxiety    Depression    Gestational diabetes    metformin - resolved after delivery   History of UTI    HSV (herpes simplex virus) infection    Migraines    Peripheral vascular disease (HCC)    Post-operative nausea and vomiting 12/19/2022   Seizures (HCC)    Jan.2016 - only had one seizure, none since   Sleep apnea    Vaginal Pap smear, abnormal    Past Surgical History:  Procedure Laterality Date   ABDOMINAL HYSTERECTOMY  12/16/2022   BIOPSY  12/24/2022   Procedure: BIOPSY;  Surgeon: Benancio Deeds, MD;  Location: [facility];  Service: Gastroenterology;;   DILATION AND CURETTAGE OF UTERUS     ESOPHAGOGASTRODUODENOSCOPY (EGD) WITH PROPOFOL N/A 12/24/2022   Procedure: ESOPHAGOGASTRODUODENOSCOPY (EGD) WITH PROPOFOL;  Surgeon: Benancio Deeds, MD;  Location: [facility];  Service: Gastroenterology;  Laterality: N/A;   LAPAROSCOPIC TUBAL LIGATION Bilateral 12/08/2018   Procedure: LAPAROSCOPIC TUBAL LIGATION;  Surgeon: Catalina Antigua, MD;  Location: [facility];  Service: Gynecology;  Laterality: Bilateral;   LEEP N/A 03/18/2022   Procedure: LOOP ELECTROSURGICAL EXCISION PROCEDURE (LEEP);  Surgeon: Catalina Antigua, MD;  Location: [facility];  Service: Gynecology;  Laterality: N/A;   TUBAL LIGATION     VAGINAL HYSTERECTOMY N/A 12/16/2022   Procedure: TOTAL VAGINAL HYSTERECTOMY, BILATERAL SALPINGECTOMY;  Surgeon: Hermina Staggers, MD;  Location: [facility];  Service: Gynecology;  Laterality: N/A;   WISDOM TOOTH EXTRACTION  04/21/2009   Social History   Tobacco Use   Smoking status: Former    Current packs/day: 0.25    Average packs/day: 0.3 packs/day for 11.6 years (2.9 ttl pk-yrs)    Types: Cigarettes    Start date: 12/21/2011    Passive exposure: Never   Smokeless tobacco: Never  Vaping Use   Vaping status: Never Used  Substance Use  Topics   Alcohol use: Yes    Comment: ocassionally    Drug use: Not Currently    Types: Marijuana    Comment: Last use was in 2023   Family History  Problem Relation Age of Onset   Diabetes Mother    Stroke Mother    Colon cancer Father 59   Cancer Father    Hypertension Maternal Grandmother    Diabetes Maternal Grandmother    Hypertension Paternal Grandmother    Breast cancer Neg Hx    Ovarian cancer Neg Hx    Endometrial cancer Neg Hx    Pancreatic cancer Neg Hx    Prostate cancer Neg Hx    No Known Allergies Current Outpatient Medications on File Prior to Visit  Medication Sig Dispense Refill   Multiple Vitamins-Minerals (HAIR SKIN AND NAILS FORMULA PO) Take 1 tablet by mouth daily.     No current facility-administered medications on file prior to visit.    Review of Systems  Constitutional:  Negative for activity change, appetite change, fatigue, fever and unexpected weight change.  HENT:  Negative for congestion, ear pain, rhinorrhea, sinus pressure and sore throat.   Eyes:  Negative for pain, redness and visual disturbance.  Respiratory:  Negative for cough, chest tightness, shortness of breath and wheezing.   Cardiovascular:  Negative for chest pain, palpitations and leg swelling.  Gastrointestinal:  Positive for abdominal pain and anal bleeding. Negative for blood in stool, constipation, diarrhea, nausea and vomiting.       N/v was one episode only / gone now   Endocrine: Negative for polydipsia and polyuria.  Genitourinary:  Negative for dysuria, frequency and urgency.  Musculoskeletal:  Negative for arthralgias, back pain and myalgias.  Skin:  Negative for pallor and rash.  Allergic/Immunologic: Negative for environmental allergies.  Neurological:  Negative for dizziness, syncope and headaches.  Hematological:  Negative for adenopathy. Does not bruise/bleed easily.  Psychiatric/Behavioral:  Negative for decreased concentration and dysphoric mood. The patient is  not nervous/anxious.        Objective:   Physical Exam Exam conducted with a chaperone present.  Constitutional:      General: She is not in acute distress.    Appearance: Normal appearance. She is well-developed. She is obese. She is not ill-appearing or diaphoretic.  HENT:     Head: Normocephalic and atraumatic.  Eyes:     Conjunctiva/sclera: Conjunctivae normal.     Pupils: Pupils are equal, round, and reactive to light.  Neck:     Thyroid: No thyromegaly.     Vascular: No carotid bruit or JVD.  Cardiovascular:     Rate and Rhythm: Normal rate and regular rhythm.  Heart sounds: Normal heart sounds.     No gallop.  Pulmonary:     Effort: Pulmonary effort is normal. No respiratory distress.     Breath sounds: Normal breath sounds. No wheezing or rales.  Abdominal:     General: There is no distension or abdominal bruit.     Palpations: Abdomen is soft. There is no mass.     Tenderness: There is no abdominal tenderness. There is no right CVA tenderness, left CVA tenderness, guarding or rebound.     Hernia: No hernia is present.  Genitourinary:    Exam position: Knee-chest position.     Rectum: Guaiac result positive. External hemorrhoid present. No mass, tenderness or anal fissure. Normal anal tone.     Comments: Small ext hemorrhoid at 4:00 -non thrombosed and not tender No mass or pain with rectal exam Scant heme positive stool Musculoskeletal:     Cervical back: Normal range of motion and neck supple.     Right lower leg: No edema.     Left lower leg: No edema.  Lymphadenopathy:     Cervical: No cervical adenopathy.  Skin:    General: Skin is warm and dry.     Coloration: Skin is not pale.     Findings: No rash.  Neurological:     Mental Status: She is alert.     Coordination: Coordination normal.     Deep Tendon Reflexes: Reflexes are normal and symmetric. Reflexes normal.  Psychiatric:        Mood and Affect: Mood normal.           Assessment & Plan:    Problem List Items Addressed This Visit       Digestive   Vomiting   One episode Now resolved  Encouraged to stop ibuprofen in light of past duodenal ulcer   Lab today  Instructed to call if symptoms return       Relevant Orders   Basic metabolic panel with GFR   CBC with Differential/Platelet   Hepatic function panel   Lipase   Rectal bleed   Scant on wiping In setting of hemorrhoids Reassuring exam but today heme card is trace positive   Has fam history of colon cancer   Lab today  Refer to GI      Relevant Orders   Ambulatory referral to Gastroenterology   Duodenal ulcer   Pt has used ibuprofen recently for abd cramping since gyn surgery in fall   Instructed to stop nsaids  This may have caused her vomiting  Reassuring exam today  Lab today      Relevant Orders   Ambulatory referral to Gastroenterology   Basic metabolic panel with GFR   CBC with Differential/Platelet   Hepatic function panel   Lipase     Other   Lower abdominal pain - Primary   Intermittent  Since partial hysterectomy and LN dissection /pelvic for cervical cancer in fall Seems cyclic Pt is unaware of any history of endometriosis   One episode of n/v/d  Hemorrhoids with brbpr (scant)  Reassuring exam Heme card trace positive  Ref to GI  Is due for colonoscopy as well (last in 2018 incomplete due to poor prep) Father had colon cancer in his 45s  Last pcp note and labs reviewed Last colonoscopy and egd reviewed   Lab today       Relevant Orders   Ambulatory referral to Gastroenterology   Basic metabolic panel with GFR   CBC with Differential/Platelet  Hepatic function panel   Lipase

## 2023-07-24 NOTE — Assessment & Plan Note (Addendum)
 Intermittent  Since partial hysterectomy and LN dissection /pelvic for cervical cancer in fall Seems cyclic Pt is unaware of any history of endometriosis   One episode of n/v/d  Hemorrhoids with brbpr (scant)  Reassuring exam Heme card trace positive  Ref to GI  Is due for colonoscopy as well (last in 2018 incomplete due to poor prep) Father had colon cancer in his 14s  Last pcp note and labs reviewed Last colonoscopy and egd reviewed   Lab today

## 2023-07-24 NOTE — Patient Instructions (Addendum)
 Stop the ibuprofen - it is bad with your history of ulcer   If vomiting or diarrhea return let us know   Use tylenol instead    Call to schedule an appointment with GI   Gastroenterology  [phone]   It will take a while to get in   In meantime if symptoms worsen , call   Labs today

## 2023-07-24 NOTE — Assessment & Plan Note (Signed)
 One episode Now resolved  Encouraged to stop ibuprofen in light of past duodenal ulcer   Lab today  Instructed to call if symptoms return

## 2023-07-25 LAB — HEPATIC FUNCTION PANEL
AG Ratio: 1.6 (calc) (ref 1.0–2.5)
ALT: 19 U/L (ref 6–29)
AST: 14 U/L (ref 10–30)
Albumin: 4.2 g/dL (ref 3.6–5.1)
Alkaline phosphatase (APISO): 63 U/L (ref 31–125)
Bilirubin, Direct: 0.1 mg/dL (ref 0.0–0.2)
Globulin: 2.7 g/dL (ref 1.9–3.7)
Indirect Bilirubin: 0.7 mg/dL (ref 0.2–1.2)
Total Bilirubin: 0.8 mg/dL (ref 0.2–1.2)
Total Protein: 6.9 g/dL (ref 6.1–8.1)

## 2023-07-25 LAB — CBC WITH DIFFERENTIAL/PLATELET
Absolute Lymphocytes: 3618 {cells}/uL (ref 850–3900)
Absolute Monocytes: 651 {cells}/uL (ref 200–950)
Basophils Absolute: 56 {cells}/uL (ref 0–200)
Basophils Relative: 0.6 %
Eosinophils Absolute: 167 {cells}/uL (ref 15–500)
Eosinophils Relative: 1.8 %
HCT: 40.4 % (ref 35.0–45.0)
Hemoglobin: 13.3 g/dL (ref 11.7–15.5)
MCH: 30.6 pg (ref 27.0–33.0)
MCHC: 32.9 g/dL (ref 32.0–36.0)
MCV: 93.1 fL (ref 80.0–100.0)
MPV: 11.6 fL (ref 7.5–12.5)
Monocytes Relative: 7 %
Neutro Abs: 4808 {cells}/uL (ref 1500–7800)
Neutrophils Relative %: 51.7 %
Platelets: 393 10*3/uL (ref 140–400)
RBC: 4.34 10*6/uL (ref 3.80–5.10)
RDW: 12.8 % (ref 11.0–15.0)
Total Lymphocyte: 38.9 %
WBC: 9.3 10*3/uL (ref 3.8–10.8)

## 2023-07-25 LAB — BASIC METABOLIC PANEL WITH GFR
BUN: 13 mg/dL (ref 7–25)
CO2: 24 mmol/L (ref 20–32)
Calcium: 9.5 mg/dL (ref 8.6–10.2)
Chloride: 105 mmol/L (ref 98–110)
Creat: 0.88 mg/dL (ref 0.50–0.97)
Glucose, Bld: 89 mg/dL (ref 65–99)
Potassium: 3.9 mmol/L (ref 3.5–5.3)
Sodium: 138 mmol/L (ref 135–146)
eGFR: 88 mL/min/{1.73_m2} (ref >=60)

## 2023-07-25 LAB — LIPASE: Lipase: 51 U/L (ref 7–60)

## 2023-07-26 ENCOUNTER — Encounter: Payer: Self-pay | Admitting: Family Medicine

## 2023-08-18 ENCOUNTER — Encounter (HOSPITAL_COMMUNITY): Payer: Self-pay

## 2023-08-18 ENCOUNTER — Emergency Department (HOSPITAL_COMMUNITY)
Admission: EM | Admit: 2023-08-18 | Discharge: 2023-08-18 | Discharge status: Against Medical Advice | Attending: Emergency Medicine | Admitting: Emergency Medicine

## 2023-08-18 ENCOUNTER — Other Ambulatory Visit: Payer: Self-pay

## 2023-08-18 ENCOUNTER — Ambulatory Visit (HOSPITAL_COMMUNITY)
Admission: EM | Admit: 2023-08-18 | Discharge: 2023-08-18 | Disposition: A | Discharge status: Home / Self Care | Attending: Emergency Medicine | Admitting: Emergency Medicine

## 2023-08-18 DIAGNOSIS — W268XXA Contact with other sharp object(s), not elsewhere classified, initial encounter: Secondary | ICD-10-CM | POA: Diagnosis not present

## 2023-08-18 DIAGNOSIS — S61212A Laceration without foreign body of right middle finger without damage to nail, initial encounter: Secondary | ICD-10-CM | POA: Diagnosis not present

## 2023-08-18 DIAGNOSIS — Z23 Encounter for immunization: Secondary | ICD-10-CM

## 2023-08-18 DIAGNOSIS — Z5321 Procedure and treatment not carried out due to patient leaving prior to being seen by health care provider: Secondary | ICD-10-CM | POA: Diagnosis not present

## 2023-08-18 DIAGNOSIS — S61213A Laceration without foreign body of left middle finger without damage to nail, initial encounter: Secondary | ICD-10-CM | POA: Diagnosis not present

## 2023-08-18 MED ORDER — SULFAMETHOXAZOLE-TRIMETHOPRIM 800-160 MG PO TABS
1.0000 | ORAL_TABLET | Freq: Two times a day (BID) | ORAL | 0 refills | Status: AC
  Filled 2023-08-18 – 2023-08-20 (×2): qty 14, 7d supply, fill #0

## 2023-08-18 MED ORDER — TETANUS-DIPHTH-ACELL PERTUSSIS 5-2.5-18.5 LF-MCG/0.5 IM SUSY
0.5000 mL | PREFILLED_SYRINGE | Freq: Once | INTRAMUSCULAR | Status: AC
  Administered 2023-08-18: 0.5 mL via INTRAMUSCULAR

## 2023-08-18 MED ORDER — TETANUS-DIPHTH-ACELL PERTUSSIS 5-2.5-18.5 LF-MCG/0.5 IM SUSY
PREFILLED_SYRINGE | INTRAMUSCULAR | Status: AC
Start: 2023-08-18 — End: ?
  Filled 2023-08-18: qty 0.5

## 2023-08-18 NOTE — ED Notes (Signed)
 Pt stated they needed to leave to go to work and I encouraged the pt to stay but pt left anyway and stated they were going to urgent care.

## 2023-08-18 NOTE — ED Provider Notes (Signed)
 [facility]    CSN: [id number] Arrival date & time: 08/18/23  1912    HISTORY   Chief Complaint  Patient presents with   Laceration   HPI Holly Brown is a pleasant, 38 y.o. female who presents to urgent care today. Patient was using a mandolin today and has a small laceration to the tip of the right middle finger. Bleeding controlled.   Patient does not know when she had a tetanus last. (06/22/2018)  The history is provided by the patient.  Laceration  Past Medical History:  Diagnosis Date   Anxiety    Depression    Gestational diabetes    metformin  - resolved after delivery   History of UTI    HSV (herpes simplex virus) infection    Migraines    Peripheral vascular disease (HCC)    Post-operative nausea and vomiting 12/19/2022   Seizures (HCC)    Jan.2016 - only had one seizure, none since   Sleep apnea    Vaginal Pap smear, abnormal    Patient Active Problem List   Diagnosis Date Noted   Lower abdominal pain 07/24/2023   Rectal bleed 07/24/2023   Vomiting 07/24/2023   Left arm weakness 05/08/2023   History of cervical dysplasia 05/08/2023   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   Left sided sciatica 04/23/2023   Low magnesium  level 04/07/2023   Vaginal irritation 01/28/2023   Cervical cancer (HCC) 12/29/2022   Duodenal ulcer 12/24/2022   S/P hysterectomy 12/19/2022   History of syncope 12/10/2022   Chronic pain of left knee 12/10/2022   Family history of colon cancer 10/10/2015   Past Surgical History:  Procedure Laterality Date   ABDOMINAL HYSTERECTOMY  12/16/2022   BIOPSY  12/24/2022   Procedure: BIOPSY;  Surgeon: Ace Holder, MD;  Location: [facility];  Service: Gastroenterology;;   DILATION AND CURETTAGE OF UTERUS     ESOPHAGOGASTRODUODENOSCOPY (EGD) WITH PROPOFOL  N/A 12/24/2022   Procedure: ESOPHAGOGASTRODUODENOSCOPY (EGD) WITH PROPOFOL ;  Surgeon: Ace Holder, MD;  Location: [facility];  Service:  Gastroenterology;  Laterality: N/A;   LAPAROSCOPIC TUBAL LIGATION Bilateral 12/08/2018   Procedure: LAPAROSCOPIC TUBAL LIGATION;  Surgeon: Verlyn Goad, MD;  Location: [facility];  Service: Gynecology;  Laterality: Bilateral;   LEEP N/A 03/18/2022   Procedure: LOOP ELECTROSURGICAL EXCISION PROCEDURE (LEEP);  Surgeon: Verlyn Goad, MD;  Location: [facility];  Service: Gynecology;  Laterality: N/A;   TUBAL LIGATION     VAGINAL HYSTERECTOMY N/A 12/16/2022   Procedure: TOTAL VAGINAL HYSTERECTOMY, BILATERAL SALPINGECTOMY;  Surgeon: Othelia Blinks, MD;  Location: [facility];  Service: Gynecology;  Laterality: N/A;   WISDOM TOOTH EXTRACTION  04/21/2009   OB History     Gravida  5   Para  4   Term  3   Preterm  1   AB  1   Living  4      SAB  1   IAB      Ectopic      Multiple  0   Live Births  4          Home Medications    Prior to Admission medications   Medication Sig Start Date End Date Taking? Authorizing Provider  Multiple Vitamins-Minerals (HAIR SKIN AND NAILS FORMULA PO) Take 1 tablet by mouth daily.    [provider]    Family History Family History  Problem Relation Age of Onset   Diabetes Mother    Stroke Mother  Colon cancer Father 11   Cancer Father    Hypertension Maternal Grandmother    Diabetes Maternal Grandmother    Hypertension Paternal Grandmother    Breast cancer Neg Hx    Ovarian cancer Neg Hx    Endometrial cancer Neg Hx    Pancreatic cancer Neg Hx    Prostate cancer Neg Hx    Social History Social History   Tobacco Use   Smoking status: Former    Current packs/day: 0.25    Average packs/day: 0.3 packs/day for 11.7 years (2.9 ttl pk-yrs)    Types: Cigarettes    Start date: 12/21/2011    Passive exposure: Never   Smokeless tobacco: Never  Vaping Use   Vaping status: Never Used  Substance Use Topics   Alcohol use: Yes    Comment: ocassionally    Drug use: Not Currently    Types: Marijuana    Comment:  Last use was in 2023   Allergies   Patient has no known allergies.  Review of Systems Review of Systems Pertinent findings revealed after performing a 14 point review of systems has been noted in the history of present illness.  Physical Exam Vital Signs BP 106/63 (BP Location: Left Arm)   Pulse 84   Temp 98 F (36.7 C) (Oral)   Resp 14   LMP  (LMP Unknown) Comment: Depo Injections - last injection 10/2022, had spotting on and off since 10/2022 injection  SpO2 97%   No data found.  Physical Exam  Visual Acuity Right Eye Distance:   Left Eye Distance:   Bilateral Distance:    Right Eye Near:   Left Eye Near:    Bilateral Near:     UC Couse / Diagnostics / Procedures:     Radiology No results found.  Procedures Procedures (including critical care time) EKG  Pending results:  Labs Reviewed - No data to display  Medications Ordered in UC: Medications  Tdap (BOOSTRIX) injection 0.5 mL (has no administration in time range)    UC Diagnoses / Final Clinical Impressions(s)   I have reviewed the triage vital signs and the nursing notes.  Pertinent labs & imaging results that were available during my care of the patient were reviewed by me and considered in my medical decision making (see chart for details).    Final diagnoses:  Laceration of left middle finger without foreign body without damage to nail, initial encounter   ***  Please see discharge instructions below for details of plan of care as provided to patient. ED Prescriptions     Medication Sig Dispense Auth. Provider   sulfamethoxazole -trimethoprim  (BACTRIM  DS) 800-160 MG tablet Take 1 tablet by mouth 2 (two) times daily for 7 days. 14 tablet Eloise Hake Scales, PA-C      PDMP not reviewed this encounter.  Pending results:  Labs Reviewed - No data to display    Discharge Instructions      The location of your wound is not an area that can be sutured easily.  I recommend that you keep  the wound covered until it is no longer bleeding or oozing.  I recommend daily dressing changes and the application of Bactrim  with each dressing change.  Please begin taking Bactrim  1 tablet twice daily for the next 7 days to prevent infection in the wound.  We gave you a Tdap booster today, your last booster was given June 22, 2018.  Please monitor your wound for signs of worsening infection or poor wound  healing which would include redness, drainage, foul odor, swelling.  Please seek repeat evaluation if any these occur.  Thank you for visiting [facility] today.      Disposition Upon Discharge:  Condition: stable for discharge home  Patient presented with an acute illness with associated systemic symptoms and significant discomfort requiring urgent management. In my opinion, this is a condition that a prudent lay person (someone who possesses an average knowledge of health and medicine) may potentially expect to result in complications if not addressed urgently such as respiratory distress, impairment of bodily function or dysfunction of bodily organs.   Routine symptom specific, illness specific and/or disease specific instructions were discussed with the patient and/or caregiver at length.   As such, the patient has been evaluated and assessed, work-up was performed and treatment was provided in alignment with urgent care protocols and evidence based medicine.  Patient/parent/caregiver has been advised that the patient may require follow up for further testing and treatment if the symptoms continue in spite of treatment, as clinically indicated and appropriate.  Patient/parent/caregiver has been advised to return to the [facility] or PCP if no better; to PCP or the Emergency Department if new signs and symptoms develop, or if the current signs or symptoms continue to change or worsen for further workup, evaluation and treatment as clinically indicated and appropriate  The patient  will follow up with their current PCP if and as advised. If the patient does not currently have a PCP we will assist them in obtaining one.   The patient may need specialty follow up if the symptoms continue, in spite of conservative treatment and management, for further workup, evaluation, consultation and treatment as clinically indicated and appropriate.  Patient/parent/caregiver verbalized understanding and agreement of plan as discussed.  All questions were addressed during visit.  Please see discharge instructions below for further details of plan.  This office note has been dictated using Teaching laboratory technician.  Unfortunately, this method of dictation can sometimes lead to typographical or grammatical errors.  I apologize for your inconvenience in advance if this occurs.  Please do not hesitate to reach out to me if clarification is needed.

## 2023-08-18 NOTE — Discharge Instructions (Addendum)
 The location of your wound is not an area that can be sutured easily.  I recommend that you keep the wound covered until it is no longer bleeding or oozing.  I recommend daily dressing changes and the application of Bactrim  with each dressing change.  Please begin taking Bactrim  1 tablet twice daily for the next 7 days to prevent infection in the wound.  We gave you a Tdap booster today, your last booster was given June 22, 2018.  Please monitor your wound for signs of worsening infection or poor wound healing which would include redness, drainage, foul odor, swelling.  Please seek repeat evaluation if any these occur.  Thank you for visiting [facility] today.

## 2023-08-18 NOTE — ED Triage Notes (Addendum)
 Patient was using a mandolin today and has a small laceration to the tip of the right middle finger. Bleeding controlled.  Patient does not know when she had a tetanus last.

## 2023-08-18 NOTE — ED Triage Notes (Signed)
 Pt presents with laceration to right middle finger. Cut on a slicer at home. Bleeding controlled. Unknown last tetanus

## 2023-08-19 ENCOUNTER — Other Ambulatory Visit (HOSPITAL_COMMUNITY): Payer: Self-pay

## 2023-08-20 ENCOUNTER — Other Ambulatory Visit (HOSPITAL_COMMUNITY): Payer: Self-pay

## 2023-08-31 ENCOUNTER — Encounter: Payer: Self-pay | Admitting: Physician Assistant

## 2023-09-01 ENCOUNTER — Telehealth: Payer: Self-pay | Admitting: *Deleted

## 2023-09-01 NOTE — Telephone Encounter (Signed)
 Attempted to reach patient. Left voicemail relaying message from Vira Grieves, NP for patient to reach out to her PCP and keep the appt. With Dr. Orvil Bland on 5/23, and requested call back to [phone].

## 2023-09-01 NOTE — Telephone Encounter (Signed)
 Spoke with Ms. Holly Brown who called the office complaining of lower pelvic pain/cramping only when she bends over and comes back up, she describes like a "pop" and rates the pain 7/10.   Pt denies fever, chills, vaginal bleeding and states she is having regular bowel movements and voiding without difficulty. She is currently not sexually active, states it has been a "little while" denies any activity before the pain started two weeks ago. Pt is asking if she needs to see her primary care doctor or come in sooner to see Dr. Orvil Bland? Pt has a scheduled appt. With Dr. Orvil Bland Friday, May 23 rd. Advised that her message would be relayed to providers.

## 2023-09-03 ENCOUNTER — Ambulatory Visit: Payer: Self-pay

## 2023-09-03 NOTE — Telephone Encounter (Signed)
  Chief Complaint: Abdominal pain Symptoms: lower abdominal pain into pelvis,  Frequency: several weeks Pertinent Negatives: Patient denies CP, SOB Disposition: [] ED /[] Urgent Care (no appt availability in office) / [x] Appointment(In office/virtual)/ []  [facility]/ [] Home Care/ [] Refused Recommended Disposition /[] [facility]/ []  Follow-up with PCP Additional Notes: patient with hx of surgery to remove cancer calling with concerns for continued abdominal pain. Patient states pain is going into her pelvis along with diarrhea. Patient is requesting to be seen in PCP office. Per protocol, patient is recommended to be seen in 24 hours. Appointment scheduled for tomorrow with another provider for 2:00 PM. Verbalized understanding and all questions answered.     Copied from CRM [id number]. Topic: Clinical - Red Word Triage >> Sep 03, 2023 10:33 AM Rosamond Comes wrote: Red Word that prompted transfer to Nurse Triage: patient calling stomach issues, cramping, all over pain and diarrhea, going on for a long time a lot of pain today Reason for Disposition  [1] MODERATE pain (e.g., interferes with normal activities) AND [2] pain comes and goes (cramps) AND [3] present > 24 hours  (Exception: Pain with Vomiting or Diarrhea - see that Guideline.)  Answer Assessment - Initial Assessment Questions 1. LOCATION: "Where does it hurt?"      Lower abdominal pain 2. RADIATION: "Does the pain shoot anywhere else?" (e.g., chest, back)     no 3. ONSET: "When did the pain begin?" (e.g., minutes, hours or days ago)      Going on for seven month 4. SUDDEN: "Gradual or sudden onset?"     Sudden onset 5. PATTERN "Does the pain come and go, or is it constant?"    - If it comes and goes: "How long does it last?" "Do you have pain now?"     (Note: Comes and goes means the pain is intermittent. It goes away completely between bouts.)    - If constant: "Is it getting better, staying the same, or getting  worse?"      (Note: Constant means the pain never goes away completely; most serious pain is constant and gets worse.)      constant 6. SEVERITY: "How bad is the pain?"  (e.g., Scale 1-10; mild, moderate, or severe)    - MILD (1-3): Doesn't interfere with normal activities, abdomen soft and not tender to touch.     - MODERATE (4-7): Interferes with normal activities or awakens from sleep, abdomen tender to touch.     - SEVERE (8-10): Excruciating pain, doubled over, unable to do any normal activities.       7 out of 10 7. RECURRENT SYMPTOM: "Have you ever had this type of stomach pain before?" If Yes, ask: "When was the last time?" and "What happened that time?"      Yes-has been going on and off since surgery 8. CAUSE: "What do you think is causing the stomach pain?"     From surgery 9. RELIEVING/AGGRAVATING FACTORS: "What makes it better or worse?" (e.g., antacids, bending or twisting motion, bowel movement)     Pain med from surgery did make it better 10. OTHER SYMPTOMS: "Do you have any other symptoms?" (e.g., back pain, diarrhea, fever, urination pain, vomiting)       diarrhea 11. PREGNANCY: "Is there any chance you are pregnant?" "When was your last menstrual period?"       no  Protocols used: Abdominal Pain - [facility]

## 2023-09-03 NOTE — Telephone Encounter (Signed)
 Noted.

## 2023-09-04 ENCOUNTER — Ambulatory Visit: Payer: Self-pay | Admitting: Family Medicine

## 2023-09-04 ENCOUNTER — Encounter: Payer: Self-pay | Admitting: Family Medicine

## 2023-09-04 ENCOUNTER — Ambulatory Visit: Admitting: Family Medicine

## 2023-09-04 ENCOUNTER — Ambulatory Visit (INDEPENDENT_AMBULATORY_CARE_PROVIDER_SITE_OTHER): Admitting: Family Medicine

## 2023-09-04 ENCOUNTER — Other Ambulatory Visit (HOSPITAL_COMMUNITY): Payer: Self-pay

## 2023-09-04 VITALS — BP 122/84 | HR 75 | Temp 98.8°F | Ht 71.0 in | Wt 232.5 lb

## 2023-09-04 DIAGNOSIS — K625 Hemorrhage of anus and rectum: Secondary | ICD-10-CM | POA: Diagnosis not present

## 2023-09-04 DIAGNOSIS — R350 Frequency of micturition: Secondary | ICD-10-CM | POA: Insufficient documentation

## 2023-09-04 DIAGNOSIS — R110 Nausea: Secondary | ICD-10-CM | POA: Insufficient documentation

## 2023-09-04 DIAGNOSIS — R8290 Unspecified abnormal findings in urine: Secondary | ICD-10-CM | POA: Diagnosis not present

## 2023-09-04 DIAGNOSIS — K269 Duodenal ulcer, unspecified as acute or chronic, without hemorrhage or perforation: Secondary | ICD-10-CM | POA: Diagnosis not present

## 2023-09-04 DIAGNOSIS — R1030 Lower abdominal pain, unspecified: Secondary | ICD-10-CM

## 2023-09-04 DIAGNOSIS — R197 Diarrhea, unspecified: Secondary | ICD-10-CM | POA: Diagnosis not present

## 2023-09-04 LAB — CBC WITH DIFFERENTIAL/PLATELET
Basophils Absolute: 0.1 10*3/uL (ref 0.0–0.1)
Basophils Relative: 0.9 % (ref 0.0–3.0)
Eosinophils Absolute: 0.3 10*3/uL (ref 0.0–0.7)
Eosinophils Relative: 3.9 % (ref 0.0–5.0)
HCT: 39.9 % (ref 36.0–46.0)
Hemoglobin: 13.4 g/dL (ref 12.0–15.0)
Lymphocytes Relative: 29 % (ref 12.0–46.0)
Lymphs Abs: 2.1 10*3/uL (ref 0.7–4.0)
MCHC: 33.6 g/dL (ref 30.0–36.0)
MCV: 91.9 fl (ref 78.0–100.0)
Monocytes Absolute: 0.5 10*3/uL (ref 0.1–1.0)
Monocytes Relative: 6.6 % (ref 3.0–12.0)
Neutro Abs: 4.2 10*3/uL (ref 1.4–7.7)
Neutrophils Relative %: 59.6 % (ref 43.0–77.0)
Platelets: 386 10*3/uL (ref 150.0–400.0)
RBC: 4.34 Mil/uL (ref 3.87–5.11)
RDW: 13.6 % (ref 11.5–15.5)
WBC: 7.1 10*3/uL (ref 4.0–10.5)

## 2023-09-04 LAB — HEPATIC FUNCTION PANEL
ALT: 12 U/L (ref 0–35)
AST: 14 U/L (ref 0–37)
Albumin: 4.5 g/dL (ref 3.5–5.2)
Alkaline Phosphatase: 70 U/L (ref 39–117)
Bilirubin, Direct: 0.2 mg/dL (ref 0.0–0.3)
Total Bilirubin: 1.5 mg/dL — ABNORMAL HIGH (ref 0.2–1.2)
Total Protein: 7.2 g/dL (ref 6.0–8.3)

## 2023-09-04 LAB — POC URINALSYSI DIPSTICK (AUTOMATED)
Bilirubin, UA: NEGATIVE
Blood, UA: 50 — AB
Glucose, UA: NEGATIVE
Ketones, UA: 5 — AB
Nitrite, UA: POSITIVE — AB
Protein, UA: POSITIVE — AB
Spec Grav, UA: 1.025 (ref 1.010–1.025)
Urobilinogen, UA: 0.2 U/dL
pH, UA: 6 (ref 5.0–8.0)

## 2023-09-04 LAB — BASIC METABOLIC PANEL WITH GFR
BUN: 10 mg/dL (ref 6–23)
CO2: 29 meq/L (ref 19–32)
Calcium: 9.7 mg/dL (ref 8.4–10.5)
Chloride: 103 meq/L (ref 96–112)
Creatinine, Ser: 0.99 mg/dL (ref 0.40–1.20)
GFR: 73.78 mL/min
Glucose, Bld: 82 mg/dL (ref 70–99)
Potassium: 4.2 meq/L (ref 3.5–5.1)
Sodium: 141 meq/L (ref 135–145)

## 2023-09-04 LAB — LIPASE: Lipase: 10 U/L — ABNORMAL LOW (ref 11.0–59.0)

## 2023-09-04 MED ORDER — FAMOTIDINE 20 MG PO TABS
20.0000 mg | ORAL_TABLET | Freq: Two times a day (BID) | ORAL | 0 refills | Status: DC
  Filled 2023-09-04: qty 60, 30d supply, fill #0

## 2023-09-04 MED ORDER — CEPHALEXIN 500 MG PO CAPS
500.0000 mg | ORAL_CAPSULE | Freq: Two times a day (BID) | ORAL | 0 refills | Status: DC
  Filled 2023-09-04: qty 14, 7d supply, fill #0

## 2023-09-04 NOTE — Assessment & Plan Note (Addendum)
 Urinalysis today positive  Also bladder fullness Likely uti  Sent for culture   Keflex  sent to pharmacy  Instructed to call if symptoms worsen while waiting for culture

## 2023-09-04 NOTE — Assessment & Plan Note (Addendum)
 Intermittent since her hysterectomy Heavy feeling  Some intermittent loose stools / no blood  Has gyn/onc visit planned next week / also GI appointment Monday  Some mild low abd tenderness today otherwise reassuring exam   Urinalysis positive - sent prescription for keflex  for uti pending culture  Labs today

## 2023-09-04 NOTE — Assessment & Plan Note (Addendum)
 Intermittent , 4 or more bms per day and some soft/some watery  Triggered by eating  Thinks she is sensitive to eggs and dairy - will avoid these  Some cramping/ not severe No blood in stool (hemorrhoids are quiet right now)  No signs and symptoms of dehydration   Reassuring exam Lab today  GI appointment is Monday   (due for colonoscopy/fam history of colon cancer)

## 2023-09-04 NOTE — Assessment & Plan Note (Signed)
 No longer on nsaids Finished protonix  Overall better but still some nausea and food intolerance Prescription pepcid  20 mg bid Follow up with GI on Monday as planned

## 2023-09-04 NOTE — Assessment & Plan Note (Signed)
 Since her hysterectomy  Trial of pepcid   Follow up with GI and gyn/onc next wk

## 2023-09-04 NOTE — Progress Notes (Signed)
 Subjective:    Patient ID: Holly Brown, female    DOB: Jun 13, 1985, 38 y.o.   MRN: [id number]  HPI  Wt Readings from Last 3 Encounters:  09/04/23 232 lb 8 oz (105.5 kg)  08/18/23 227 lb 1.2 oz (103 kg)  07/24/23 234 lb (106.1 kg)   32.43 kg/m  Vitals:   09/04/23 0903  BP: 122/84  Pulse: 75  Temp: 98.8 F (37.1 C)  SpO2: 100%    38 yo pt of NP Dugal presents with GI complaints  Abd pain  Diarrhea   Now a lot of pressure in low abdomen  Better to stand Worse to walk  Some nausea  Tylenol  - helps just a little (no longer taking any nsaids)   Eating makes it worse  Unsure what triggers   She has recently changed her diet  Only meat is fish  Plant based proteins  Thinks she is lactose intol  May be intol to eggs   No longer on protonix      Some diarrhea  Mix of watery to soft Comes and goes   No burning to urinate  Wonders about possible uti   (frequent urination but also drinking more water)  No vomiting   No vaginal bleeding     She has a history of  Duodenal ulcer  Rectal bleeding with hemorrhoids   Partial hysterectomy for cervical cancer in past   Attempted colonoscopy 2018 -incomplete due to poor prep    Has appointment with GI Monday  Gyn/onc on 23rd for surgical follow up      Lab Results  Component Value Date   WBC 9.3 07/24/2023   HGB 13.3 07/24/2023   HCT 40.4 07/24/2023   MCV 93.1 07/24/2023   PLT 393 07/24/2023   Lab Results  Component Value Date   NA 138 07/24/2023   K 3.9 07/24/2023   CO2 24 07/24/2023   GLUCOSE 89 07/24/2023   BUN 13 07/24/2023   CREATININE 0.88 07/24/2023   CALCIUM 9.5 07/24/2023   GFR 73.22 01/14/2023   EGFR 88 07/24/2023   GFRNONAA >60 02/06/2023   Lab Results  Component Value Date   ALT 19 07/24/2023   AST 14 07/24/2023   ALKPHOS 58 02/06/2023   BILITOT 0.8 07/24/2023   Urinalysis today Results for orders placed or performed in visit on 09/04/23  POCT Urinalysis Dipstick  (Automated)   Collection Time: 09/04/23  9:55 AM  Result Value Ref Range   Color, UA Yellow    Clarity, UA Cloudy    Glucose, UA Negative Negative   Bilirubin, UA Negative    Ketones, UA 5 mg/dL (A)    Spec Grav, UA 7.829 1.010 - 1.025   Blood, UA 50 Ery/uL (A)    pH, UA 6.0 5.0 - 8.0   Protein, UA Positive (A) Negative   Urobilinogen, UA 0.2 0.2 or 1.0 E.U./dL   Nitrite, UA Positive (A)    Leukocytes, UA Large (3+) (A) Negative      Patient Active Problem List   Diagnosis Date Noted   Nausea 09/04/2023   Diarrhea 09/04/2023   Frequent urination 09/04/2023   Lower abdominal pain 07/24/2023   Rectal bleed 07/24/2023   Left arm weakness 05/08/2023   History of cervical dysplasia 05/08/2023   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   Left sided sciatica 04/23/2023   Low magnesium  level 04/07/2023   Vaginal irritation 01/28/2023   Cervical cancer (HCC) 12/29/2022   Duodenal ulcer 12/24/2022   S/P  hysterectomy 12/19/2022   History of syncope 12/10/2022   Chronic pain of left knee 12/10/2022   Family history of colon cancer 10/10/2015   Past Medical History:  Diagnosis Date   Anxiety    Depression    Gestational diabetes    metformin  - resolved after delivery   History of UTI    HSV (herpes simplex virus) infection    Migraines    Peripheral vascular disease (HCC)    Post-operative nausea and vomiting 12/19/2022   Seizures (HCC)    Jan.2016 - only had one seizure, none since   Sleep apnea    Vaginal Pap smear, abnormal    Past Surgical History:  Procedure Laterality Date   ABDOMINAL HYSTERECTOMY  12/16/2022   BIOPSY  12/24/2022   Procedure: BIOPSY;  Surgeon: Ace Holder, MD;  Location: [facility];  Service: Gastroenterology;;   DILATION AND CURETTAGE OF UTERUS     ESOPHAGOGASTRODUODENOSCOPY (EGD) WITH PROPOFOL  N/A 12/24/2022   Procedure: ESOPHAGOGASTRODUODENOSCOPY (EGD) WITH PROPOFOL ;  Surgeon: Ace Holder, MD;  Location: [facility];  Service: Gastroenterology;  Laterality: N/A;   LAPAROSCOPIC TUBAL LIGATION Bilateral 12/08/2018   Procedure: LAPAROSCOPIC TUBAL LIGATION;  Surgeon: Verlyn Goad, MD;  Location: [facility];  Service: Gynecology;  Laterality: Bilateral;   LEEP N/A 03/18/2022   Procedure: LOOP ELECTROSURGICAL EXCISION PROCEDURE (LEEP);  Surgeon: Verlyn Goad, MD;  Location: [facility];  Service: Gynecology;  Laterality: N/A;   TUBAL LIGATION     VAGINAL HYSTERECTOMY N/A 12/16/2022   Procedure: TOTAL VAGINAL HYSTERECTOMY, BILATERAL SALPINGECTOMY;  Surgeon: Othelia Blinks, MD;  Location: [facility];  Service: Gynecology;  Laterality: N/A;   WISDOM TOOTH EXTRACTION  04/21/2009   Social History   Tobacco Use   Smoking status: Former    Current packs/day: 0.25    Average packs/day: 0.3 packs/day for 11.7 years (2.9 ttl pk-yrs)    Types: Cigarettes    Start date: 12/21/2011    Passive exposure: Never   Smokeless tobacco: Never  Vaping Use   Vaping status: Never Used  Substance Use Topics   Alcohol use: Yes    Comment: ocassionally    Drug use: Not Currently    Types: Marijuana    Comment: Last use was in 2023   Family History  Problem Relation Age of Onset   Diabetes Mother    Stroke Mother    Colon cancer Father 21   Cancer Father    Hypertension Maternal Grandmother    Diabetes Maternal Grandmother    Hypertension Paternal Grandmother    Breast cancer Neg Hx    Ovarian cancer Neg Hx    Endometrial cancer Neg Hx    Pancreatic cancer Neg Hx    Prostate cancer Neg Hx    No Known Allergies Current Outpatient Medications on File Prior to Visit  Medication Sig Dispense Refill   Multiple Vitamins-Minerals (HAIR SKIN AND NAILS FORMULA PO) Take 1 tablet by mouth daily.     No current facility-administered medications on file prior to visit.    Review of Systems  Constitutional:  Negative for chills, fatigue and fever.       Has lost weight/ but also changed eating habits    Gastrointestinal:  Positive for abdominal distention, abdominal pain, diarrhea and nausea. Negative for anal bleeding, blood in stool, constipation, rectal pain and vomiting.  Genitourinary:  Positive for frequency, pelvic pain and urgency. Negative for dysuria, hematuria, vaginal bleeding, vaginal discharge and vaginal pain.  Objective:   Physical Exam Constitutional:      General: She is not in acute distress.    Appearance: Normal appearance. She is well-developed. She is obese. She is not ill-appearing or diaphoretic.  HENT:     Head: Normocephalic and atraumatic.  Eyes:     Conjunctiva/sclera: Conjunctivae normal.     Pupils: Pupils are equal, round, and reactive to light.  Neck:     Thyroid: No thyromegaly.     Vascular: No carotid bruit or JVD.  Cardiovascular:     Rate and Rhythm: Normal rate and regular rhythm.     Heart sounds: Normal heart sounds.     No gallop.  Pulmonary:     Effort: Pulmonary effort is normal. No respiratory distress.     Breath sounds: Normal breath sounds. No wheezing or rales.  Abdominal:     General: Abdomen is protuberant. Bowel sounds are normal. There is no distension or abdominal bruit.     Palpations: Abdomen is soft. There is no fluid wave, hepatomegaly, splenomegaly, mass or pulsatile mass.     Tenderness: There is abdominal tenderness in the right lower quadrant, epigastric area and left lower quadrant. There is no right CVA tenderness or left CVA tenderness. Negative signs include Murphy's sign and McBurney's sign.     Hernia: No hernia is present.     Comments: Very slight epigastric tenderness Mild LQ tenderness bilat  No obv hernia  Musculoskeletal:     Cervical back: Normal range of motion and neck supple.     Right lower leg: No edema.     Left lower leg: No edema.  Lymphadenopathy:     Cervical: No cervical adenopathy.  Skin:    General: Skin is warm and dry.     Coloration: Skin is not pale.     Findings: No rash.   Neurological:     Mental Status: She is alert.     Coordination: Coordination normal.     Deep Tendon Reflexes: Reflexes are normal and symmetric. Reflexes normal.  Psychiatric:        Mood and Affect: Mood normal.           Assessment & Plan:   Problem List Items Addressed This Visit       Digestive   Rectal bleed   None currently  GI appointment monday      Duodenal ulcer   No longer on nsaids Finished protonix  Overall better but still some nausea and food intolerance Prescription pepcid  20 mg bid Follow up with GI on Monday as planned         Other   Nausea   Since her hysterectomy  Trial of pepcid   Follow up with GI and gyn/onc next wk      Relevant Orders   CBC with Differential/Platelet   Lipase   Basic metabolic panel with GFR   Hepatic function panel   Lower abdominal pain - Primary   Intermittent since her hysterectomy Heavy feeling  Some intermittent loose stools / no blood  Has gyn/onc visit planned next week / also GI appointment Monday  Some mild low abd tenderness today otherwise reassuring exam   Urinalysis positive - sent prescription for keflex  for uti pending culture  Labs today         Relevant Orders   CBC with Differential/Platelet   Lipase   Basic metabolic panel with GFR   Hepatic function panel   Urine Culture   Frequent urination   Urinalysis  today positive  Also bladder fullness Likely uti  Sent for culture   Keflex  sent to pharmacy  Instructed to call if symptoms worsen while waiting for culture       Relevant Orders   POCT Urinalysis Dipstick (Automated) (Completed)   Urine Culture   Diarrhea   Intermittent , 4 or more bms per day and some soft/some watery  Triggered by eating  Thinks she is sensitive to eggs and dairy - will avoid these  Some cramping/ not severe No blood in stool (hemorrhoids are quiet right now)  No signs and symptoms of dehydration   Reassuring exam Lab today  GI appointment is  Monday   (due for colonoscopy/fam history of colon cancer)       Relevant Orders   CBC with Differential/Platelet   Lipase   Basic metabolic panel with GFR   Hepatic function panel   Other Visit Diagnoses       Abnormal urinalysis       Relevant Orders   Urine Culture

## 2023-09-04 NOTE — Assessment & Plan Note (Signed)
 None currently  GI appointment monday

## 2023-09-04 NOTE — Patient Instructions (Addendum)
 Keep hydrated Labs today   Urinalysis today    See the GI specialist and the gyn/onc as planned  Any severe symptoms - go to the ER   Since you are off protonix  try generic pepcid  twice daily to see if it helps nausea      The following are examples of protein in diet  Meat (I know you are trying to be vegetarian)  Fish  Eggs -hold if they bother you  Dairy products -hold if they bother you  Soy products (soy milk/tofu)  Oat milk  Almond milk Nuts and nut butters  Dried beans   Make your diet changes gradually to avoid GI upset

## 2023-09-06 LAB — URINE CULTURE
MICRO NUMBER:: 16466105
SPECIMEN QUALITY:: ADEQUATE

## 2023-09-06 NOTE — Telephone Encounter (Signed)
 NOTED

## 2023-09-06 NOTE — Progress Notes (Signed)
 Holly Canard, PA-C [address] [place], [place]  [address] Phone: [phone]   Gastroenterology Consultation  Referring Provider:     Felicita Horns, FNP Primary Care Physician:  Felicita Horns, FNP Primary Gastroenterologist:  Holly Canard, PA-C / Dr. Laurell Pond  Reason for Consultation:     Lower abdominal pain, diarrhea        HPI:   Holly Brown is a 38 y.o. y/o female referred for consultation & management  by Felicita Horns, FNP.    Current symptoms: Patient reports having 3-4 loose and watery stools per day.  Denies constipation or rectal bleeding.  She had hysterectomy 12/16/2022.  Was found to have cervical cancer and underwent more surgery with radiation 01/2023 by Dr. Orvil Bland.  Since then she has noticed increasing bilateral lower abdominal cramping which comes and goes.  Has occasional episode of nausea but no vomiting.  No fever or chills.  Previous history of duodenal ulcer 12/2022.  She has stopped taking all NSAIDs.  She denies any upper GI symptoms or melena.  Takes Pepcid  20 Mg daily.  Is not on PPI.  09/04/2023 labs: Slightly elevated total bilirubin 1.5.  All other LFTs normal.  Normal direct bilirubin 0.2.  Labs consistent with Gilbert's syndrome.  Urine culture positive for E. coli UTI treated with antibiotics (cephalexin )..  Normal CBC (WBC 7.1, Hgb 13.4).  Normal BMP and lipase.  12/2022 CT abdomen pelvis with contrast: S/p vaginal hysterectomy 12/16/2022, prior surgical changes and hematoma since hysterectomy.  Stomach, small and large intestine unremarkable.  Focal hepatic steatosis, liver otherwise unremarkable.  Normal gallbladder and pancreas.  12/2022 EGD in hospital: 1 cm hiatal hernia, nonbleeding superficial duodenal ulcer 4 to 5 mm.  Normal stomach and esophagus.  Ulcer treated with Protonix  40 Mg twice daily and told to avoid NSAIDs.  Biopsies negative for H. pylori or intestinal metaplasia.  04/2016 colonoscopy by Dr. Bridgett Camps: Incomplete due to  very poor prep (with Suprep).  Patient could not tolerate Suprep due to vomiting the prep.  Unsatisfactory bowel prep.   Repeat colonoscopy was recommended with 2-day prep, and patient has not completed.   Patient's father had colon cancer.  Patient has difficulty swallowing large pills and declines a pill prep.  She has been able to tolerate MiraLAX  in the past.  Past Medical History:  Diagnosis Date   Anxiety    Depression    Gestational diabetes    metformin  - resolved after delivery   History of UTI    HSV (herpes simplex virus) infection    Migraines    Peripheral vascular disease (HCC)    Post-operative nausea and vomiting 12/19/2022   Seizures (HCC)    Jan.2016 - only had one seizure, none since   Sleep apnea    Vaginal Pap smear, abnormal     Past Surgical History:  Procedure Laterality Date   ABDOMINAL HYSTERECTOMY  12/16/2022   BIOPSY  12/24/2022   Procedure: BIOPSY;  Surgeon: Ace Holder, MD;  Location: [facility];  Service: Gastroenterology;;   DILATION AND CURETTAGE OF UTERUS     ESOPHAGOGASTRODUODENOSCOPY (EGD) WITH PROPOFOL  N/A 12/24/2022   Procedure: ESOPHAGOGASTRODUODENOSCOPY (EGD) WITH PROPOFOL ;  Surgeon: Ace Holder, MD;  Location: [facility];  Service: Gastroenterology;  Laterality: N/A;   LAPAROSCOPIC TUBAL LIGATION Bilateral 12/08/2018   Procedure: LAPAROSCOPIC TUBAL LIGATION;  Surgeon: Verlyn Goad, MD;  Location: [facility];  Service: Gynecology;  Laterality: Bilateral;   LEEP N/A 03/18/2022  Procedure: LOOP ELECTROSURGICAL EXCISION PROCEDURE (LEEP);  Surgeon: Verlyn Goad, MD;  Location: [facility];  Service: Gynecology;  Laterality: N/A;   TUBAL LIGATION     VAGINAL HYSTERECTOMY N/A 12/16/2022   Procedure: TOTAL VAGINAL HYSTERECTOMY, BILATERAL SALPINGECTOMY;  Surgeon: Othelia Blinks, MD;  Location: [facility];  Service: Gynecology;  Laterality: N/A;   WISDOM TOOTH EXTRACTION  04/21/2009    Prior to Admission  medications   Medication Sig Start Date End Date Taking? Authorizing Provider  cephALEXin  (KEFLEX ) 500 MG capsule Take 1 capsule (500 mg total) by mouth 2 (two) times daily. 09/04/23   Tower, Manley Seeds, MD  famotidine  (PEPCID ) 20 MG tablet Take 1 tablet (20 mg total) by mouth 2 (two) times daily. 09/04/23   Tower, Manley Seeds, MD  Multiple Vitamins-Minerals (HAIR SKIN AND NAILS FORMULA PO) Take 1 tablet by mouth daily.    [provider]    Family History  Problem Relation Age of Onset   Diabetes Mother    Stroke Mother    Colon cancer Father 16   Cancer Father    Hypertension Maternal Grandmother    Diabetes Maternal Grandmother    Hypertension Paternal Grandmother    Esophageal cancer Neg Hx    Liver disease Neg Hx      Social History   Tobacco Use   Smoking status: Former    Current packs/day: 0.25    Average packs/day: 0.3 packs/day for 11.7 years (2.9 ttl pk-yrs)    Types: Cigarettes    Start date: 12/21/2011    Passive exposure: Never   Smokeless tobacco: Never  Vaping Use   Vaping status: Never Used  Substance Use Topics   Alcohol use: Yes    Comment: ocassionally    Drug use: Not Currently    Types: Marijuana    Comment: Last use was in 2023    Allergies as of 09/07/2023   (No Known Allergies)    Review of Systems:    All systems reviewed and negative except where noted in HPI.   Physical Exam:  BP 120/62   Pulse (!) 57   Ht 5\' 10"  (1.778 m)   Wt 235 lb (106.6 kg)   LMP  (LMP Unknown) Comment: Depo Injections - last injection 10/2022, had spotting on and off since 10/2022 injection  BMI 33.72 kg/m  No LMP recorded (lmp unknown). Patient has had a hysterectomy.  General:   Alert,  Well-developed, well-nourished, pleasant and cooperative in NAD Lungs:  Respirations even and unlabored.  Clear throughout to auscultation.   No wheezes, crackles, or rhonchi. No acute distress. Heart:  Regular rate and rhythm; no murmurs, clicks, rubs, or gallops. Abdomen:   Normal bowel sounds.  No bruits.  Soft, and non-distended without masses, hepatosplenomegaly or hernias noted.  There is mild right lower quadrant tenderness.  Rest of abdomen is not tender.  No guarding or rebound tenderness.    Neurologic:  Alert and oriented x3;  grossly normal neurologically. Psych:  Alert and cooperative. Normal mood and affect.  Imaging Studies: No results found.  Labs: CBC    Component Value Date/Time   WBC 7.1 09/04/2023 0932   RBC 4.34 09/04/2023 0932   HGB 13.4 09/04/2023 0932   HGB 12.5 10/12/2018 0908   HCT 39.9 09/04/2023 0932   HCT 38.0 10/12/2018 0908   PLT 386.0 09/04/2023 0932   PLT 372 10/12/2018 0908   MCV 91.9 09/04/2023 0932   MCV 85 10/12/2018 0908   MCH 30.6 07/24/2023 1554  MCHC 33.6 09/04/2023 0932   RDW 13.6 09/04/2023 0932   RDW 14.0 10/12/2018 0908   LYMPHSABS 2.1 09/04/2023 0932   LYMPHSABS 2.3 03/29/2018 1105   MONOABS 0.5 09/04/2023 0932   EOSABS 0.3 09/04/2023 0932   EOSABS 0.1 03/29/2018 1105   BASOSABS 0.1 09/04/2023 0932   BASOSABS 0.0 03/29/2018 1105    CMP     Component Value Date/Time   NA 141 09/04/2023 0932   NA 139 10/10/2015 1623   K 4.2 09/04/2023 0932   CL 103 09/04/2023 0932   CO2 29 09/04/2023 0932   GLUCOSE 82 09/04/2023 0932   BUN 10 09/04/2023 0932   BUN 7 10/10/2015 1623   CREATININE 0.99 09/04/2023 0932   CREATININE 0.88 07/24/2023 1554   CALCIUM 9.7 09/04/2023 0932   PROT 7.2 09/04/2023 0932   PROT 7.4 10/10/2015 1623   ALBUMIN 4.5 09/04/2023 0932   ALBUMIN 4.3 10/10/2015 1623   AST 14 09/04/2023 0932   ALT 12 09/04/2023 0932   ALKPHOS 70 09/04/2023 0932   BILITOT 1.5 (H) 09/04/2023 0932   BILITOT 0.7 10/10/2015 1623   GFRNONAA >60 02/06/2023 0813   GFRAA >60 12/10/2018 1300    Assessment and Plan:   Holly Brown is a 38 y.o. y/o female has been referred for:  1.  Lower abdominal cramping -Rx Dicyclomine  20mg  3 times daily, #90, 2 refills.  2.  Diarrhea - Stool Studies: GI  Pathogen Panal, C. Difficile Toxin PCR, Fecal Calprotectin   3.  Mildly elevated total bilirubin.  Direct bilirubin normal.  Labs consistent with Gilbert's Syndrome.  Benign.   - Reassurance.  Patient education given.  4.  History of duodenal ulcer (seen on EGD 12/2022; biopsy negative for H. pylori). - She is instructed to avoid NSAIDs - Continue famotidine  20 Mg daily. - If she has recurrent upper abdominal pain, then restart PPI.  5.  Family history of father with colon cancer - If stool studies are negative for infectious pathogens, then we will schedule repeat colonoscopy with 2-day prep.  Patient states she vomited Suprep previously.  She has difficulty swallowing pills.  She thinks she would be able to tolerate MiraLAX  and Gatorade prep.  6.  Nausea - Zofran  4mg  Q 4-6 hours prn.  Follow up in 4 weeks with TG.  We will work on scheduling colonoscopy at her next appointment.  Holly Canard, PA-C

## 2023-09-07 ENCOUNTER — Ambulatory Visit: Admitting: Physician Assistant

## 2023-09-07 ENCOUNTER — Other Ambulatory Visit (HOSPITAL_COMMUNITY): Payer: Self-pay

## 2023-09-07 ENCOUNTER — Other Ambulatory Visit

## 2023-09-07 ENCOUNTER — Encounter: Payer: Self-pay | Admitting: Physician Assistant

## 2023-09-07 VITALS — BP 120/62 | HR 57 | Ht 70.0 in | Wt 235.0 lb

## 2023-09-07 DIAGNOSIS — Z800 Family history of malignant neoplasm of digestive organs: Secondary | ICD-10-CM

## 2023-09-07 DIAGNOSIS — R17 Unspecified jaundice: Secondary | ICD-10-CM

## 2023-09-07 DIAGNOSIS — R197 Diarrhea, unspecified: Secondary | ICD-10-CM

## 2023-09-07 DIAGNOSIS — R110 Nausea: Secondary | ICD-10-CM | POA: Diagnosis not present

## 2023-09-07 DIAGNOSIS — Z8711 Personal history of peptic ulcer disease: Secondary | ICD-10-CM | POA: Diagnosis not present

## 2023-09-07 DIAGNOSIS — R1030 Lower abdominal pain, unspecified: Secondary | ICD-10-CM

## 2023-09-07 DIAGNOSIS — Z8719 Personal history of other diseases of the digestive system: Secondary | ICD-10-CM

## 2023-09-07 DIAGNOSIS — R109 Unspecified abdominal pain: Secondary | ICD-10-CM

## 2023-09-07 MED ORDER — ONDANSETRON HCL 4 MG PO TABS
4.0000 mg | ORAL_TABLET | Freq: Three times a day (TID) | ORAL | 2 refills | Status: DC | PRN
  Filled 2023-09-07 (×2): qty 30, 10d supply, fill #0

## 2023-09-07 MED ORDER — DICYCLOMINE HCL 20 MG PO TABS
20.0000 mg | ORAL_TABLET | Freq: Three times a day (TID) | ORAL | 2 refills | Status: DC
Start: 2023-09-07 — End: 2023-10-30
  Filled 2023-09-07 (×2): qty 90, 30d supply, fill #0

## 2023-09-07 NOTE — Patient Instructions (Addendum)
 Your provider has requested that you go to the basement level for lab work before leaving today. Press "B" on the elevator. The lab is located at the first door on the left as you exit the elevator.  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 This is a condition that causes a substance called "bilirubin" to build up in the blood. Oletta Berry syndrome is caused by an abnormal gene that runs in families. People who have Oletta Berry syndrome were born with it.  What are the symptoms of Oletta Berry syndrome? Often, Oletta Berry syndrome does not cause any symptoms. If it does, the most common symptom is "jaundice." This makes the skin or whites of the eyes look yellow. Symptoms usually first appear during the teen years, but they can also happen in newborn babies. In people with Oletta Berry syndrome, jaundice can be triggered by a fever, physical effort such as hard exercise, stress, or not eating.  How is Oletta Berry syndrome treated? Oletta Berry syndrome does not need treatment. The jaundice does not usually cause health problems.  We have sent the following medications to your pharmacy for you to pick up at your convenience: Dicyclomine  20 mg three times daily and Ondansetron  4 mg every 8 hours as needed.   Please follow up sooner if symptoms increase or worsen  Due to recent changes in healthcare laws, you may see the results of your imaging and laboratory studies on MyChart before your provider has had a chance to review them.  We understand that in some cases there may be results that are confusing or concerning to you. Not all laboratory results come back in the same time frame and the provider may be waiting for multiple results in order to interpret others.  Please give us  48 hours in order for your provider to thoroughly review all the results before contacting the office for clarification of your results.   _______________________________________________________  If your blood pressure at your visit was 140/90 or greater, please  contact your primary care physician to follow up on this.  _______________________________________________________  If you are age 67 or older, your body mass index should be between 23-30. Your Body mass index is 33.72 kg/m. If this is out of the aforementioned range listed, please consider follow up with your Primary Care Provider.  If you are age 54 or younger, your body mass index should be between 19-25. Your Body mass index is 33.72 kg/m. If this is out of the aformentioned range listed, please consider follow up with your Primary Care Provider.   ________________________________________________________  The [place] [facility] providers would like to encourage you to use MYCHART to communicate with providers for non-urgent requests or questions.  Due to long hold times on the telephone, sending your provider a message by [facility] may be a faster and more efficient way to get a response.  Please allow 48 business hours for a response.  Please remember that this is for non-urgent requests.  _______________________________________________________ Thank you for trusting me with your gastrointestinal care!   Brigitte Canard, PA-C

## 2023-09-08 ENCOUNTER — Encounter: Payer: Self-pay | Admitting: Gynecologic Oncology

## 2023-09-08 ENCOUNTER — Other Ambulatory Visit (HOSPITAL_COMMUNITY): Payer: Self-pay

## 2023-09-08 ENCOUNTER — Ambulatory Visit: Payer: Self-pay

## 2023-09-08 MED ORDER — NITROFURANTOIN MONOHYD MACRO 100 MG PO CAPS
100.0000 mg | ORAL_CAPSULE | Freq: Two times a day (BID) | ORAL | 0 refills | Status: DC
  Filled 2023-09-08: qty 10, 5d supply, fill #0

## 2023-09-08 NOTE — Telephone Encounter (Addendum)
 Copied from CRM [id number]. Topic: Clinical - Red Word Triage >> Sep 08, 2023  9:03 AM Adonis Hoot wrote: Red Word that prompted transfer to Nurse Triage: really bad itching from medications  cephALEXin  (KEFLEX ) 500 MG capsule   famotidine  (PEPCID ) 20 MG tablet  Chief Complaint: Widespread itching Symptoms: Scratching, itching after starting medication Frequency: Yesterday Pertinent Negatives: Patient denies rash, difficulty breathing, airway swelling, difficulty swallowing Disposition: [] ED /[] Urgent Care (no appt availability in office) / [] Appointment(In office/virtual)/ []  [place] [facility]/ [] Home Care/ [] Refused Recommended Disposition /[] [facility]/ [x]  Follow-up with PCP Additional Notes: Patient called in to report widespread itching after starting Keflex  and Pepcid  yesterday. Patient denied rash, difficulty breathing, facial and airway swelling, fever and difficulty swallowing. Patient is seeking advice from provider on how to proceed. Advised this RN would route this conversation to office for their discretion. Provided care advice and instructed patient to call back if symptoms worsen. Patient complied. Please advise.   Reason for Disposition  Taking prescription medication that could cause itching (e.g., codeine /morphine /other opiates, aspirin)  Answer Assessment - Initial Assessment Questions 1. DESCRIPTION: "Describe the itching you are having."     Widespread itch 2. SEVERITY: "How bad is it?"    - MILD: Doesn't interfere with normal activities.   - MODERATE-SEVERE: Interferes with work, school, sleep, or other activities.      Moderate 3. SCRATCHING: "Are there any scratch marks? Bleeding?"     States she has been trying her best not to scratch, redness present from scratching, denies broken skin 4. ONSET: "When did this begin?"      Yesterday afternoon 5. CAUSE: "What do you think is causing the itching?" (ask about swimming pools, pollen, animals, soaps,  etc.)     Started Keflex  and Pepcid  the morning the itching started 6. OTHER SYMPTOMS: "Do you have any other symptoms?"      Denies rash, denies difficulty breathing, denies fever, denies difficulty swallowing, denies facial/tongue swelling  Protocols used: Itching - [facility]

## 2023-09-08 NOTE — Addendum Note (Signed)
 Addended by: Deri Fleet A on: 09/08/2023 03:56 PM   Modules accepted: Orders

## 2023-09-08 NOTE — Telephone Encounter (Signed)
Pt notified of Dr. Tower's comments and verbalized understanding  

## 2023-09-08 NOTE — Telephone Encounter (Signed)
 You may be allergic to the keflex  (add to allergy list please)  Please stop it  We may or may not need to start another medicine for uti  How are your low abdominal symptoms ?

## 2023-09-08 NOTE — Telephone Encounter (Signed)
 Left VM requesting pt to call the office back

## 2023-09-08 NOTE — Telephone Encounter (Signed)
 I want to replace keflex  with macrobid  (sent to [facility] pharmacy)  If no improvement follow up

## 2023-09-08 NOTE — Telephone Encounter (Signed)
 Copied from CRM [id number]. Topic: Clinical - Medical Advice >> Sep 08, 2023  1:05 PM Magdalene School wrote: Reason for CRM: Patient returning Ms. Holly Brown's call. I attempted to contact [facility] but Ms. Aralyn Nowak is on her lunch break. I informed patient of the note provided to stop keflex  due to allergy and asked about abdominal symptoms. Patient stated: she only took the medication one time starting yesterday 09/07/23 and the abdominal pain is easing up a little bit. She would like to know if she should still stop the medication. For call back before 3 pm patient would like to use work phone number: [phone] After 3 pm call back to cell number: [phone]

## 2023-09-09 ENCOUNTER — Other Ambulatory Visit (HOSPITAL_COMMUNITY): Payer: Self-pay

## 2023-09-09 NOTE — Telephone Encounter (Signed)
 NOTED

## 2023-09-10 ENCOUNTER — Telehealth: Payer: Self-pay | Admitting: Physician Assistant

## 2023-09-10 ENCOUNTER — Ambulatory Visit (HOSPITAL_COMMUNITY)
Admission: RE | Admit: 2023-09-10 | Discharge: 2023-09-10 | Disposition: A | Discharge status: Home / Self Care | Payer: Self-pay | Source: Ambulatory Visit | Attending: Family Medicine | Admitting: Family Medicine

## 2023-09-10 ENCOUNTER — Other Ambulatory Visit (HOSPITAL_COMMUNITY): Payer: Self-pay | Admitting: Family Medicine

## 2023-09-10 ENCOUNTER — Ambulatory Visit (HOSPITAL_COMMUNITY): Admission: RE | Admit: 2023-09-10 | Payer: Self-pay | Source: Ambulatory Visit

## 2023-09-10 DIAGNOSIS — W1789XA Other fall from one level to another, initial encounter: Secondary | ICD-10-CM

## 2023-09-10 DIAGNOSIS — S3992XA Unspecified injury of lower back, initial encounter: Secondary | ICD-10-CM | POA: Diagnosis not present

## 2023-09-10 DIAGNOSIS — M533 Sacrococcygeal disorders, not elsewhere classified: Secondary | ICD-10-CM | POA: Insufficient documentation

## 2023-09-10 DIAGNOSIS — Y990 Civilian activity done for income or pay: Secondary | ICD-10-CM | POA: Insufficient documentation

## 2023-09-10 DIAGNOSIS — Z043 Encounter for examination and observation following other accident: Secondary | ICD-10-CM | POA: Diagnosis not present

## 2023-09-10 DIAGNOSIS — M549 Dorsalgia, unspecified: Secondary | ICD-10-CM | POA: Diagnosis not present

## 2023-09-10 NOTE — Progress Notes (Signed)
 Addendum: Reviewed and agree with assessment and management plan. Asha Grumbine, Carie Caddy, MD

## 2023-09-10 NOTE — Telephone Encounter (Signed)
 Holly Brown-  Patient is prescribed dicyclomine  20 mg TID dosing. C/O increased daytime somnolence with this med.   Should she change dosing or try another antispasmotic?

## 2023-09-10 NOTE — Telephone Encounter (Signed)
 PT was recently prescribed Bentyl  and she stated that it is making her sleepy to the point she can't function at work. She would like to discuss how she should take this medication. Please advise.

## 2023-09-10 NOTE — Telephone Encounter (Signed)
 Patient has been advised of recommendation to decrease dicyclomine  to 10 mg twice daily and can d/c scheduled dosing,using only as needed if abdominal cramping/pain has subsided. I have also asked patient to make us  aware if symptoms do not improve with this change in dosing. She verbalizes understanding.

## 2023-09-11 ENCOUNTER — Inpatient Hospital Stay: Payer: 59 | Attending: Gynecologic Oncology | Admitting: Gynecologic Oncology

## 2023-09-11 NOTE — Progress Notes (Signed)
 No show for visit

## 2023-09-28 ENCOUNTER — Encounter: Payer: Self-pay | Admitting: Gynecologic Oncology

## 2023-10-01 ENCOUNTER — Encounter: Payer: Self-pay | Admitting: Gynecologic Oncology

## 2023-10-01 ENCOUNTER — Inpatient Hospital Stay: Attending: Gynecologic Oncology | Admitting: Gynecologic Oncology

## 2023-10-01 VITALS — BP 130/84 | HR 62 | Temp 98.0°F | Resp 16 | Ht 70.0 in | Wt 234.0 lb

## 2023-10-01 DIAGNOSIS — C539 Malignant neoplasm of cervix uteri, unspecified: Secondary | ICD-10-CM

## 2023-10-01 DIAGNOSIS — Z8541 Personal history of malignant neoplasm of cervix uteri: Secondary | ICD-10-CM | POA: Insufficient documentation

## 2023-10-01 DIAGNOSIS — Z90710 Acquired absence of both cervix and uterus: Secondary | ICD-10-CM | POA: Diagnosis not present

## 2023-10-01 DIAGNOSIS — R102 Pelvic and perineal pain: Secondary | ICD-10-CM | POA: Insufficient documentation

## 2023-10-01 DIAGNOSIS — M6289 Other specified disorders of muscle: Secondary | ICD-10-CM

## 2023-10-01 DIAGNOSIS — N898 Other specified noninflammatory disorders of vagina: Secondary | ICD-10-CM | POA: Insufficient documentation

## 2023-10-01 DIAGNOSIS — Z9079 Acquired absence of other genital organ(s): Secondary | ICD-10-CM | POA: Diagnosis not present

## 2023-10-01 NOTE — Patient Instructions (Signed)
 It was good to see you today.  I will let you know when I get your biopsy results back, likely tomorrow.  I also placed a referral for pelvic floor physical therapy.  We would tentatively get you scheduled for follow-up in 6 months.  As always, please call with any new and concerning symptoms between visits such as vaginal bleeding, new or worsening pelvic pain, change to bowel or bladder function, or unintentional weight loss.

## 2023-10-01 NOTE — Progress Notes (Signed)
 Gynecologic Oncology Return Clinic Visit  10/01/23  Reason for Visit: Surveillance   Treatment History: Patient has a long history of abnormal uterine bleeding refractory to medical management.  History also notable for cervical dysplasia.  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  Repeat Pap in 02/2022 was again HSIL, HPV 16+.  Patient was also found to be positive for chlamydia and trichomoniasis at that time.  Cold knife cone biopsy was performed on 03/18/2022 revealing CIN-2-3 with extension into underlying endocervical glands, negative for invasive carcinoma.  Endocervical margin was positive for high-grade dysplasia.  STD testing in March 2024 again showed patient positive for chlamydia and trichomoniasis.   More recently, Pap test on 10/13/2022 was HSIL, high risk HPV positive.  2 cervical biopsies both showed CIN-1, ECC showed CIN-2-3.   In the setting of abnormal uterine bleeding, the patient was offered definitive surgical management and on 12/16/2022 underwent TVH lateral salpingectomy.  This included a 10-week sized uterus with normal tubes and ovaries.  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  Lymphovascular invasion not identified.  All margins were negative for invasive carcinoma and high-grade dysplasia.   02/11/23: Robotic bilateral pelvic lymphadenectomy  Interval History: Overall doing well.  Has been following with GI secondary to symptoms since her initial surgery.  Has been taking Pepcid  with improvement.  Sounds like she will be set up to have a colonoscopy.  Bowel function has improved since last time I saw her.  She reports more regular stools and stool is soft.  Denies any urinary symptoms.  Denies any vaginal bleeding or discharge.  Has occasional pelvic pain.  Endorses sometimes hearing a pop when she walks.  Past  Medical/Surgical History: Past Medical History:  Diagnosis Date   Anxiety    Depression    Gestational diabetes    metformin  - resolved after delivery   History of UTI    HSV (herpes simplex virus) infection    Migraines    Peripheral vascular disease (HCC)    Post-operative nausea and vomiting 12/19/2022   Seizures (HCC)    Jan.2016 - only had one seizure, none since   Sleep apnea    Vaginal Pap smear, abnormal     Past Surgical History:  Procedure Laterality Date   ABDOMINAL HYSTERECTOMY  12/16/2022   BIOPSY  12/24/2022   Procedure: BIOPSY;  Surgeon: Ace Holder, MD;  Location: [facility];  Service: Gastroenterology;;   DILATION AND CURETTAGE OF UTERUS     ESOPHAGOGASTRODUODENOSCOPY (EGD) WITH PROPOFOL  N/A 12/24/2022   Procedure: ESOPHAGOGASTRODUODENOSCOPY (EGD) WITH PROPOFOL ;  Surgeon: Ace Holder, MD;  Location: [facility];  Service: Gastroenterology;  Laterality: N/A;   LAPAROSCOPIC TUBAL LIGATION Bilateral 12/08/2018   Procedure: LAPAROSCOPIC TUBAL LIGATION;  Surgeon: Verlyn Goad, MD;  Location: [facility];  Service: Gynecology;  Laterality: Bilateral;   LEEP N/A 03/18/2022   Procedure: LOOP ELECTROSURGICAL EXCISION PROCEDURE (LEEP);  Surgeon: Verlyn Goad, MD;  Location: [facility];  Service: Gynecology;  Laterality: N/A;   TUBAL LIGATION     VAGINAL HYSTERECTOMY N/A 12/16/2022   Procedure: TOTAL VAGINAL HYSTERECTOMY, BILATERAL SALPINGECTOMY;  Surgeon: Othelia Blinks, MD;  Location: [facility];  Service: Gynecology;  Laterality: N/A;   WISDOM TOOTH EXTRACTION  04/21/2009    Family History  Problem Relation Age of Onset   Diabetes Mother  Stroke Mother    Colon cancer Father 16   Cancer Father    Hypertension Maternal Grandmother    Diabetes Maternal Grandmother    Hypertension Paternal Grandmother    Esophageal cancer Neg Hx    Liver disease Neg Hx     Social History   Socioeconomic History   Marital status: Single     Spouse name: Not on file   Number of children: 4   Years of education: college   Highest education level: Some college, no degree  Occupational History    Employer: [employer]    Comment: Tax inspector  Tobacco Use   Smoking status: Former    Current packs/day: 0.25    Average packs/day: 0.3 packs/day for 11.8 years (2.9 ttl pk-yrs)    Types: Cigarettes    Start date: 12/21/2011    Passive exposure: Never   Smokeless tobacco: Never  Vaping Use   Vaping status: Never Used  Substance and Sexual Activity   Alcohol use: Yes    Comment: ocassionally    Drug use: Not Currently    Types: Marijuana    Comment: Last use was in 2023   Sexual activity: Not Currently    Partners: Male    Birth control/protection: Surgical    Comment: Tubal Ligation  Other Topics Concern   Not on file  Social History Narrative   Lives with her grandmother and her three children   Does not drink caffeine    Social Drivers of Corporate investment banker Strain: Low Risk  (04/07/2023)   Overall Financial Resource Strain (CARDIA)    Difficulty of Paying Living Expenses: Not very hard  Food Insecurity: No Food Insecurity (04/07/2023)   Hunger Vital Sign    Worried About Running Out of Food in the Last Year: Never true    Ran Out of Food in the Last Year: Never true  Transportation Needs: No Transportation Needs (04/07/2023)   PRAPARE - Administrator, Civil Service (Medical): No    Lack of Transportation (Non-Medical): No  Physical Activity: Unknown (04/07/2023)   Exercise Vital Sign    Days of Exercise per Week: 0 days    Minutes of Exercise per Session: Not on file  Stress: Stress Concern Present (04/07/2023)   [employer] of Occupational Health - Occupational Stress Questionnaire    Feeling of Stress : To some extent  Social Connections: Moderately Isolated (04/07/2023)   Social Connection and Isolation Panel    Frequency of Communication with Friends and Family: More  than three times a week    Frequency of Social Gatherings with Friends and Family: Patient declined    Attends Religious Services: 1 to 4 times per year    Active Member of [employer] or Organizations: No    Attends Engineer, structural: Not on file    Marital Status: Never married    Current Medications:  Current Outpatient Medications:    dicyclomine  (BENTYL ) 20 MG tablet, Take 1 tablet (20 mg total) by mouth 3 (three) times daily before meals., Disp: 90 tablet, Rfl: 2   famotidine  (PEPCID ) 20 MG tablet, Take 1 tablet (20 mg total) by mouth 2 (two) times daily., Disp: 60 tablet, Rfl: 0   Multiple Vitamins-Minerals (HAIR SKIN AND NAILS FORMULA PO), Take 1 tablet by mouth daily., Disp: , Rfl:    ondansetron  (ZOFRAN ) 4 MG tablet, Take 1 tablet (4 mg total) by mouth every 8 (eight) hours as needed for nausea or vomiting.,  Disp: 30 tablet, Rfl: 2  Review of Systems: Denies appetite changes, fevers, chills, fatigue, unexplained weight changes. Denies hearing loss, neck lumps or masses, mouth sores, ringing in ears or voice changes. Denies cough or wheezing.  Denies shortness of breath. Denies chest pain or palpitations. Denies leg swelling. Denies blood in stools, constipation, diarrhea, nausea, vomiting, or early satiety. Denies pain with intercourse, dysuria, frequency, hematuria or incontinence. Denies hot flashes, vaginal bleeding or vaginal discharge.   Denies joint pain, back pain or muscle pain/cramps. Denies itching, rash, or wounds. Denies dizziness, headaches, numbness or seizures. Denies swollen lymph nodes or glands, denies easy bruising or bleeding. Denies anxiety, depression, confusion, or decreased concentration.  Physical Exam: BP 130/84 (BP Location: Left Arm, Patient Position: Sitting)   Pulse 62   Temp 98 F (36.7 C) (Tympanic)   Resp 16   Ht 5' 10 (1.778 m)   Wt 234 lb (106.1 kg)   LMP  (LMP Unknown) Comment: Depo Injections - last injection 10/2022, had  spotting on and off since 10/2022 injection  SpO2 98%   BMI 33.58 kg/m  General: Alert, oriented, no acute distress. HEENT: Posterior oropharynx clear, sclera anicteric. Chest: Clear to auscultation bilaterally.  No wheezes or rhonchi. Cardiovascular: Regular rate and rhythm, no murmurs. Abdomen: soft, nontender.  Normoactive bowel sounds.  No masses or hepatosplenomegaly appreciated.  Well-healed incisions. Extremities: Grossly normal range of motion.  Warm, well perfused.  No edema bilaterally. Skin: No rashes or lesions noted. Lymphatics: No cervical, supraclavicular, or inguinal adenopathy. GU: Normal appearing external genitalia without erythema, excoriation, or lesions.  Speculum exam reveals well-rugated vagina.  At the cuff, there is an area along the mid left portion of the cuff that looks somewhat cobblestone in appearance but no atypical vascularity.  Bimanual exam reveals this area to be somewhat firm and thickened.  No masses.    Vaginal cuff biopsy procedure Preoperative diagnosis: Vaginal cuff lesion Postoperative diagnosis: Same as above Physician: Orvil Bland MD Estimated blood loss: Minimal Specimens: Agile cuff biopsy Procedure: After the vaginal cuff had been palpated and visualized, recommendation was made to proceed with biopsy.  Patient gave verbal consent.  She was already in dorsolithotomy position.  The speculum was replaced in the vagina and the cuff well-visualized.  This was cleansed with Betadine   x3.  Tischler forceps were then used to biopsy the cuff.  Pressure and silver nitrate was used to achieve hemostasis.  This was placed in formalin.  Overall the patient tolerated the procedure well.  All instruments were removed from the vagina.  Laboratory & Radiologic Studies: None new  Assessment & Plan: Holly Brown is a 38 y.o. 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  S/p staging LND in 01/2023. No adjuvant treatment (did not meet Sedlis criteria). PDL1  CPS 0%.   Doing well.  Given exam findings, vaginal cuff biopsy performed.  I will call her with these results.  She struggled significantly after surgery given pelvic hematoma with regard to pelvic symptoms.  Given some ongoing pelvic pain, discussed benefit of pelvic floor physical therapy.  Referral placed today.   Per NCCN surveillance recommendations, will continue with a visit and exam every 6 months.  I reviewed signs and symptoms that would be concerning for cancer recurrence and stressed the importance of calling if she develops any of these between visits.    Plan for annual cotesting.  She will be due in September; we will plan to do this at her  next follow-up visit.  22 minutes of total time was spent for this patient encounter, including preparation, face-to-face counseling with the patient and coordination of care, and documentation of the encounter.  Wiley Hanger, MD  Division of Gynecologic Oncology  Department of Obstetrics and Gynecology  [facility]

## 2023-10-05 LAB — SURGICAL PATHOLOGY

## 2023-10-06 ENCOUNTER — Ambulatory Visit: Payer: Self-pay | Admitting: Gynecologic Oncology

## 2023-10-07 NOTE — Progress Notes (Signed)
 Hi Jaime and April, Just called to discuss biopsy results with her by phone also.  Given appearance of her vaginal cuff on exam, I discussed that I would feel better seeing her back for a visit in 3-4 months rather than 6.  Could 1 of you please call her and get her scheduled for a sooner visit?  Thank you! Kat

## 2023-10-08 NOTE — Telephone Encounter (Signed)
 Spoke with Ms. Holly Brown and patient was scheduled for a 3 month follow up with Dr. Orvil Bland on Thursday, September 25 th. At 3:15 pm. Pt agreed to date and time of appointment and thanked the office for calling.

## 2023-10-08 NOTE — Telephone Encounter (Signed)
-----   Message from Suzi Essex sent at 10/07/2023  7:03 PM EDT ----- Pandora Bogaert and April, Just called to discuss biopsy results with her by phone also.  Given appearance of her vaginal cuff on exam, I discussed that I would feel better seeing her back for a visit in 3-4 months rather than  6.  Could 1 of you please call her and get her scheduled for a sooner visit?  Thank you! Barrett Lick  ----- Message ----- From: Interface, Lab In Three Zero One Sent: 10/05/2023   8:51 AM EDT To: Suzi Essex, MD

## 2023-10-16 ENCOUNTER — Telehealth: Payer: Self-pay

## 2023-10-16 ENCOUNTER — Other Ambulatory Visit (HOSPITAL_COMMUNITY): Payer: Self-pay

## 2023-10-16 ENCOUNTER — Ambulatory Visit (INDEPENDENT_AMBULATORY_CARE_PROVIDER_SITE_OTHER): Admitting: Family

## 2023-10-16 VITALS — BP 104/70 | HR 84 | Temp 97.6°F | Ht 70.0 in | Wt 225.0 lb

## 2023-10-16 DIAGNOSIS — J3489 Other specified disorders of nose and nasal sinuses: Secondary | ICD-10-CM | POA: Insufficient documentation

## 2023-10-16 DIAGNOSIS — N939 Abnormal uterine and vaginal bleeding, unspecified: Secondary | ICD-10-CM | POA: Insufficient documentation

## 2023-10-16 DIAGNOSIS — H538 Other visual disturbances: Secondary | ICD-10-CM | POA: Insufficient documentation

## 2023-10-16 MED ORDER — AMOXICILLIN-POT CLAVULANATE 600-42.9 MG/5ML PO SUSR
875.0000 mg | Freq: Two times a day (BID) | ORAL | 0 refills | Status: DC
  Filled 2023-10-16: qty 150, 10d supply, fill #0

## 2023-10-16 NOTE — Patient Instructions (Signed)
  Start some over the counter flonase nose spray two sprays once daily.

## 2023-10-16 NOTE — Assessment & Plan Note (Signed)
 Suspect from turning head quickly with sinusitis, I do recommend that she sees eye doctor as overdue for eye exam to r/o eye strain. Neuro exam wnl.

## 2023-10-16 NOTE — Assessment & Plan Note (Signed)
 Advised pt to f/u with gynecology in regards to this, she is going to call their office today.

## 2023-10-16 NOTE — Assessment & Plan Note (Signed)
 Prescription given for augmentin 875/125 mg po bid for ten days. Pt to continue tylenol/ibuprofen prn sinus pain. Continue with humidifier prn and steam showers recommended as well. instructed If no symptom improvement in 48 hours please f/u

## 2023-10-16 NOTE — Progress Notes (Signed)
 Established Patient Office Visit  Subjective:   Patient ID: Holly Brown, female    DOB: [birth year]  Age: 38 y.o. MRN: [id number]  CC:  Chief Complaint  Patient presents with   Headache    Pt states back in April she fell and hit her head after drinking a Red Bull. Went to EMS to be evaluated and was thought to be ok. She has frequent headaches and visual changes since.    Vaginal Bleeding    States she had intercourse last night and was bleeding after. Had a hysterectomy in August 2024. Oncologist did a biopsy of some lumps on her ovaries 10-01-23. Was told she may have some bleeding, but did not at that time.     HPI: Holly Brown is a 38 y.o. female presenting on 10/16/2023 for Headache (Pt states back in April she fell and hit her head after drinking a Red Bull. Went to EMS to be evaluated and was thought to be ok. She has frequent headaches and visual changes since. ) and Vaginal Bleeding (States she had intercourse last night and was bleeding after. Had a hysterectomy in August 2024. Oncologist did a biopsy of some lumps on her ovaries 10-01-23. Was told she may have some bleeding, but did not at that time. )  [place]lem[place] back in April at a concert after drinking an energy drink. She did fall and she looked up and saw people around her, she was monitored at the concert at the EMS tent and was ok so they let her go. She states she did not have any symptoms fater this event but then did start about two weeks ago with headaches.   She states she has noticed increasing headaches that have started to cause blurry vision in the last two weeks. She states it happened the other week and she didn't feel safe while she was driving so she had her mom drive her home. She is overdue for her eye exam she is going to set up an eye appt. She does state for the past few weeks she has had some left sided sinus pressure around her nose. She will blow her nose and it feels full, she puts pressure on it and it  gives her some relief. Takes sudafed with eases it up a little bit as well. She does have watery eyes. No cough. She does feel dizzy if she turns her head and or bends over and will have to sit for a few minutes and then will feel better.   Had a recent GYN appt 10/01/23 where they did a biopsy of her cervix that came back benign. She states that she noticed bleeding after sex, a good amount, and then cleaned up went to put on a pad but didn't notice any more blood expect for maybe one little spot. When she wiped this am she also noticed slight red vaginal discharge, not a lot.        ROS: Negative unless specifically indicated above in HPI.   Relevant past medical history reviewed and updated as indicated.   Allergies and medications reviewed and updated.   Current Outpatient Medications:    amoxicillin -clavulanate (AUGMENTIN  ES-600) 600-42.9 MG/5ML suspension, Take 7.3 mLs (875 mg total) by mouth 2 (two) times daily for 10 days., Disp: 150 mL, Rfl: 0   dicyclomine  (BENTYL ) 20 MG tablet, Take 1 tablet (20 mg total) by mouth 3 (three) times daily before meals., Disp: 90 tablet, Rfl: 2   famotidine  (PEPCID )  20 MG tablet, Take 1 tablet (20 mg total) by mouth 2 (two) times daily., Disp: 60 tablet, Rfl: 0   Multiple Vitamins-Minerals (HAIR SKIN AND NAILS FORMULA PO), Take 1 tablet by mouth daily., Disp: , Rfl:    ondansetron  (ZOFRAN ) 4 MG tablet, Take 1 tablet (4 mg total) by mouth every 8 (eight) hours as needed for nausea or vomiting., Disp: 30 tablet, Rfl: 2  Allergies  Allergen Reactions   Keflex  [Cephalexin ]     Itching     Objective:   BP 104/70 (BP Location: Left Arm, Patient Position: Sitting, Cuff Size: Large)   Pulse 84   Temp 97.6 F (36.4 C) (Oral)   Ht 5' 10 (1.778 m)   Wt 225 lb (102.1 kg)   LMP  (LMP Unknown) Comment: Depo Injections - last injection 10/2022, had spotting on and off since 10/2022 injection  BMI 32.28 kg/m    Physical Exam Vitals reviewed.   Constitutional:      General: She is not in acute distress.    Appearance: Normal appearance. She is normal weight. She is not ill-appearing, toxic-appearing or diaphoretic.  HENT:     Head: Normocephalic.     Right Ear: Tympanic membrane normal.     Left Ear: Tympanic membrane normal.     Nose:     Right Turbinates: Enlarged.     Left Turbinates: Enlarged.     Left Sinus: Maxillary sinus tenderness and frontal sinus tenderness present.     Mouth/Throat:     Mouth: Mucous membranes are dry.     Pharynx: No oropharyngeal exudate or posterior oropharyngeal erythema.   Eyes:     Extraocular Movements: Extraocular movements intact.     Pupils: Pupils are equal, round, and reactive to light.    Cardiovascular:     Rate and Rhythm: Normal rate and regular rhythm.     Pulses: Normal pulses.     Heart sounds: Normal heart sounds.  Pulmonary:     Effort: Pulmonary effort is normal.     Breath sounds: Normal breath sounds.   Musculoskeletal:     Cervical back: Normal range of motion.   Neurological:     General: No focal deficit present.     Mental Status: She is alert and oriented to person, place, and time. Mental status is at baseline.     Cranial Nerves: Cranial nerves 2-12 are intact.   Psychiatric:        Mood and Affect: Mood normal.        Behavior: Behavior normal.        Thought Content: Thought content normal.        Judgment: Judgment normal.     Assessment & Plan:  Frontal sinus pain Assessment & Plan: Prescription given for augmentin  875/125 mg po bid for ten days. Pt to continue tylenol /ibuprofen  prn sinus pain. Continue with humidifier prn and steam showers recommended as well. instructed If no symptom improvement in 48 hours please f/u    Orders: -     Amoxicillin -Pot Clavulanate; Take 7.3 mLs (875 mg total) by mouth 2 (two) times daily for 10 days.  Dispense: 150 mL; Refill: 0  Blurry vision, bilateral Assessment & Plan: Suspect from turning head  quickly with sinusitis, I do recommend that she sees eye doctor as overdue for eye exam to r/o eye strain. Neuro exam wnl.     Abnormal uterine bleeding (AUB) Assessment & Plan: Advised pt to f/u with gynecology in regards to this, she  is going to call their office today.     Recommend flonase daily as well for allergies.   Follow up plan: Return if symptoms worsen or fail to improve.  Ginger Patrick, FNP

## 2023-10-16 NOTE — Telephone Encounter (Signed)
 Holly Brown called office stating she was seen by Dr.Tucker on 6/12. A vaginal cuff biopsy was done.  Earlier this week she had intercourse with no issues. Last night after intercourse she had a lot of bright red bleeding. This morning it has tapered off and is just pink smears on pad and toilet tissue. No cramping/pain, no fever/chills, no UTI S&S.   Advised to monitor with pelvic rest and call office for heavier bleeding/pain. She voiced an understanding  Message sent to Dr.Tucker for advice, will call pt back

## 2023-10-16 NOTE — Telephone Encounter (Signed)
 Voicemail left for patient to return call. Per Dr.Tucker message, pt to see [name] NP next week.

## 2023-10-16 NOTE — Telephone Encounter (Signed)
 Pt aware of message from  Dr.Tucker. She is coming in to see Lyndon Mill on 7/2 @ 3:15. (Pt could not do any other day/time d.t schedule conflict).   Advised pelvic rest until appointment. She voiced an understanding

## 2023-10-19 ENCOUNTER — Ambulatory Visit: Admitting: Physician Assistant

## 2023-10-21 ENCOUNTER — Encounter: Payer: Self-pay | Admitting: Obstetrics & Gynecology

## 2023-10-21 ENCOUNTER — Telehealth: Payer: Self-pay

## 2023-10-21 ENCOUNTER — Inpatient Hospital Stay: Attending: Gynecologic Oncology | Admitting: Obstetrics & Gynecology

## 2023-10-21 VITALS — BP 126/74 | HR 63 | Temp 98.0°F | Resp 18 | Wt 225.6 lb

## 2023-10-21 DIAGNOSIS — N930 Postcoital and contact bleeding: Secondary | ICD-10-CM | POA: Diagnosis not present

## 2023-10-21 DIAGNOSIS — Z9079 Acquired absence of other genital organ(s): Secondary | ICD-10-CM | POA: Diagnosis not present

## 2023-10-21 DIAGNOSIS — Z8541 Personal history of malignant neoplasm of cervix uteri: Secondary | ICD-10-CM | POA: Insufficient documentation

## 2023-10-21 DIAGNOSIS — Z90710 Acquired absence of both cervix and uterus: Secondary | ICD-10-CM | POA: Diagnosis not present

## 2023-10-21 DIAGNOSIS — Z90722 Acquired absence of ovaries, bilateral: Secondary | ICD-10-CM | POA: Diagnosis not present

## 2023-10-21 DIAGNOSIS — C539 Malignant neoplasm of cervix uteri, unspecified: Secondary | ICD-10-CM

## 2023-10-21 NOTE — Telephone Encounter (Signed)
 Left message for patient need to move appointment from today to either 7/8 or next [facility] clinic.

## 2023-10-21 NOTE — Progress Notes (Signed)
 Follow Up Note: Gyn-Onc  Holly Brown 38 y.o. female  CC: Short interval f/u visit   HPI: The oncology history was reviewed.  Interval History: She presents for a short interval f/u visit.  The histology from the vaginal cuff biopsy performed at the last visit showed atypical cells.   Discussed the use of AI scribe software for clinical note transcription with the patient, who gave verbal consent to proceed.  History of Present Illness   She experienced post-coital bleeding for the first time the night before contacting her doctor. The bleeding was minimal, described as a 'speck' on the pad, but more noticeable when wiping, and persisted for three days following intercourse. She underwent a biopsy approximately three weeks prior to the bleeding episode and is concerned about a potential connection.  She has been experiencing pelvic pain described as 'uncomfortable' and 'crampy' since her surgery in December. The pain is tolerable and has not required medication. She has been sexually active four to five times since the surgery, with this being the first instance of post-coital bleeding. No current bleeding is reported, but persistent pelvic pain since surgery is noted.  She underwent a hysterectomy and removal of her fallopian tubes in August of the previous year. She is uncertain about whether her cervix was also removed, as she has received conflicting information from different medical providers. Despite this, she continues to experience cramping and is uncertain about the completeness of the hysterectomy.    Review of Systems  Review of Systems  Constitutional:  Negative for malaise/fatigue and weight loss.  Respiratory:  Negative for shortness of breath and wheezing.   Cardiovascular:  Negative for chest pain and leg swelling.  Gastrointestinal:  Negative for abdominal pain, blood in stool, constipation, nausea and vomiting.  Genitourinary:  Negative for dysuria, frequency,  hematuria and urgency.  Musculoskeletal:  Negative for joint pain and myalgias.  Neurological:  Negative for weakness.  Psychiatric/Behavioral:  Negative for depression. The patient does not have insomnia.    Current medications, allergy, social history, past surgical history, past medical history, family history were all reviewed.    Vitals:  Blood pressure 126/74, pulse 63, temperature 98 F (36.7 C), temperature source Oral, resp. rate 18, weight 225 lb 9.6 oz (102.3 kg), SpO2 100%.  Physical Exam:  Physical Exam Exam conducted with a chaperone present.  Constitutional:      General: She is not in acute distress. Cardiovascular:     Rate and Rhythm: Normal rate and regular rhythm.  Pulmonary:     Effort: Pulmonary effort is normal.     Breath sounds: Normal breath sounds. No wheezing or rhonchi.  Abdominal:     Palpations: Abdomen is soft.     Tenderness: There is no abdominal tenderness. There is no right CVA tenderness or left CVA tenderness.     Hernia: No hernia is present.  Genitourinary:    General: Normal vulva.     Urethra: No urethral lesion.     Vagina: Erythematous, friable focus at the vaginal cuff, left margin. Monsel's applied Musculoskeletal:     Cervical back: Neck supple.     Right lower leg: No edema.     Left lower leg: No edema.  Lymphadenopathy:     Upper Body:     Right upper body: No supraclavicular adenopathy.     Left upper body: No supraclavicular adenopathy.     Lower Body: No right inguinal adenopathy. No left inguinal adenopathy.  Skin:    Findings:  No rash.  Neurological:     Mental Status: She is oriented to person, place, and time.    Assessment/Plan:  Cervical cancer (HCC) Holly Brown is a 38 y.o. 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  Further imaging is indicated in light of her sxs, exam findings She was advised that she has had a complete hysterectomy She also has prolonged postop pain.  PFPT is  planned.   >Review the pelvic MRI findings  >Pelvic rest x 2 wks >Return prn or keep the next regularly schedule appointment w/Dr. [name] [place], MD

## 2023-10-21 NOTE — Assessment & Plan Note (Addendum)
 Holly Brown is a 38 y.o. 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  Further imaging is indicated in light of her sxs, exam findings She was advised that she has had a complete hysterectomy She also has prolonged postop pain.  PFPT is planned.   >Review the pelvic MRI findings  >Pelvic rest x 2 wks >Return prn or keep the next regularly schedule appointment w/Dr. [name]

## 2023-10-21 NOTE — Patient Instructions (Signed)
 Return as needed, or keep appointment already scheduled with Dr.Tucker in September

## 2023-10-22 ENCOUNTER — Telehealth: Payer: Self-pay

## 2023-10-22 NOTE — Telephone Encounter (Signed)
 LVM for patient to call office regarding scheduling an MRI, Per Dr.Jackson-Moore.

## 2023-10-22 NOTE — Telephone Encounter (Signed)
-----   Message from Eleanor JONETTA Epps sent at 10/21/2023  7:07 PM EDT ----- Dr. JERELD is supposed to be ordering an MRI for her so would keep an eye out and let me know if you dont see it ordered.

## 2023-10-22 NOTE — Telephone Encounter (Signed)
-----   Message from Eleanor JONETTA Epps sent at 10/22/2023 10:30 AM EDT ----- Thank you! ----- Message ----- From: [place], Dulcie Gammon, CMA Sent: 10/22/2023   8:00 AM EDT To: Eleanor JONETTA Epps, NP  MRI has been ordered and printed. I will work on getting pt scheduled. ----- Message ----- From: Epps Eleanor JONETTA, NP Sent: 10/21/2023   7:08 PM EDT To: Ami LITTIE Fess, RN; Koehn Salehi [place], CMA; Ap#  Dr. JERELD is supposed to be ordering an MRI for her so would keep an eye out and let me know if you dont see it ordered.

## 2023-10-22 NOTE — Telephone Encounter (Signed)
 MRI scheduled for Tuesday 7/8 @ 5:00p with arrival time of 4:30p

## 2023-10-24 DIAGNOSIS — Z9079 Acquired absence of other genital organ(s): Secondary | ICD-10-CM

## 2023-10-24 DIAGNOSIS — Z6832 Body mass index (BMI) 32.0-32.9, adult: Secondary | ICD-10-CM

## 2023-10-24 DIAGNOSIS — Z87891 Personal history of nicotine dependence: Secondary | ICD-10-CM

## 2023-10-24 DIAGNOSIS — E872 Acidosis, unspecified: Secondary | ICD-10-CM | POA: Diagnosis present

## 2023-10-24 DIAGNOSIS — E876 Hypokalemia: Secondary | ICD-10-CM | POA: Diagnosis present

## 2023-10-24 DIAGNOSIS — A09 Infectious gastroenteritis and colitis, unspecified: Principal | ICD-10-CM | POA: Diagnosis present

## 2023-10-24 DIAGNOSIS — N83202 Unspecified ovarian cyst, left side: Secondary | ICD-10-CM | POA: Diagnosis present

## 2023-10-24 DIAGNOSIS — Z8541 Personal history of malignant neoplasm of cervix uteri: Secondary | ICD-10-CM

## 2023-10-24 DIAGNOSIS — Z90710 Acquired absence of both cervix and uterus: Secondary | ICD-10-CM

## 2023-10-24 DIAGNOSIS — K529 Noninfective gastroenteritis and colitis, unspecified: Secondary | ICD-10-CM | POA: Diagnosis not present

## 2023-10-24 DIAGNOSIS — K5289 Other specified noninfective gastroenteritis and colitis: Secondary | ICD-10-CM | POA: Diagnosis present

## 2023-10-24 DIAGNOSIS — E66811 Obesity, class 1: Secondary | ICD-10-CM | POA: Diagnosis present

## 2023-10-25 ENCOUNTER — Emergency Department (HOSPITAL_COMMUNITY)

## 2023-10-25 ENCOUNTER — Inpatient Hospital Stay (HOSPITAL_COMMUNITY)
Admission: EM | Admit: 2023-10-25 | Discharge: 2023-10-27 | DRG: 392 | Disposition: A | Discharge status: Home / Self Care | Attending: Internal Medicine | Admitting: Internal Medicine

## 2023-10-25 ENCOUNTER — Encounter (HOSPITAL_COMMUNITY): Payer: Self-pay | Admitting: *Deleted

## 2023-10-25 ENCOUNTER — Other Ambulatory Visit: Payer: Self-pay

## 2023-10-25 DIAGNOSIS — E66811 Obesity, class 1: Secondary | ICD-10-CM | POA: Diagnosis not present

## 2023-10-25 DIAGNOSIS — Z87891 Personal history of nicotine dependence: Secondary | ICD-10-CM | POA: Diagnosis not present

## 2023-10-25 DIAGNOSIS — Z6832 Body mass index (BMI) 32.0-32.9, adult: Secondary | ICD-10-CM | POA: Diagnosis not present

## 2023-10-25 DIAGNOSIS — R7989 Other specified abnormal findings of blood chemistry: Secondary | ICD-10-CM

## 2023-10-25 DIAGNOSIS — Z90710 Acquired absence of both cervix and uterus: Secondary | ICD-10-CM | POA: Diagnosis not present

## 2023-10-25 DIAGNOSIS — A09 Infectious gastroenteritis and colitis, unspecified: Secondary | ICD-10-CM | POA: Diagnosis not present

## 2023-10-25 DIAGNOSIS — K5289 Other specified noninfective gastroenteritis and colitis: Secondary | ICD-10-CM | POA: Diagnosis not present

## 2023-10-25 DIAGNOSIS — R1032 Left lower quadrant pain: Secondary | ICD-10-CM | POA: Diagnosis not present

## 2023-10-25 DIAGNOSIS — K449 Diaphragmatic hernia without obstruction or gangrene: Secondary | ICD-10-CM | POA: Diagnosis not present

## 2023-10-25 DIAGNOSIS — K529 Noninfective gastroenteritis and colitis, unspecified: Secondary | ICD-10-CM | POA: Diagnosis present

## 2023-10-25 DIAGNOSIS — E876 Hypokalemia: Secondary | ICD-10-CM | POA: Diagnosis not present

## 2023-10-25 DIAGNOSIS — N83202 Unspecified ovarian cyst, left side: Secondary | ICD-10-CM | POA: Diagnosis not present

## 2023-10-25 DIAGNOSIS — E872 Acidosis, unspecified: Secondary | ICD-10-CM | POA: Diagnosis present

## 2023-10-25 DIAGNOSIS — R109 Unspecified abdominal pain: Principal | ICD-10-CM

## 2023-10-25 DIAGNOSIS — Z9079 Acquired absence of other genital organ(s): Secondary | ICD-10-CM | POA: Diagnosis not present

## 2023-10-25 DIAGNOSIS — Z8541 Personal history of malignant neoplasm of cervix uteri: Secondary | ICD-10-CM

## 2023-10-25 LAB — URINALYSIS, ROUTINE W REFLEX MICROSCOPIC
Bilirubin Urine: NEGATIVE
Glucose, UA: >=500 mg/dL — AB
Ketones, ur: 5 mg/dL — AB
Leukocytes,Ua: NEGATIVE
Nitrite: NEGATIVE
Protein, ur: NEGATIVE mg/dL
Specific Gravity, Urine: 1.024 (ref 1.005–1.030)
pH: 6 (ref 5.0–8.0)

## 2023-10-25 LAB — COMPREHENSIVE METABOLIC PANEL WITH GFR
ALT: 18 U/L (ref 0–44)
AST: 20 U/L (ref 15–41)
Albumin: 4.2 g/dL (ref 3.5–5.0)
Alkaline Phosphatase: 55 U/L (ref 38–126)
Anion gap: 19 — ABNORMAL HIGH (ref 5–15)
BUN: 8 mg/dL (ref 6–20)
CO2: 15 mmol/L — ABNORMAL LOW (ref 22–32)
Calcium: 9.6 mg/dL (ref 8.9–10.3)
Chloride: 102 mmol/L (ref 98–111)
Creatinine, Ser: 0.98 mg/dL (ref 0.44–1.00)
GFR, Estimated: >60 mL/min (ref >60)
Glucose, Bld: 145 mg/dL — ABNORMAL HIGH (ref 70–99)
Potassium: 3.3 mmol/L — ABNORMAL LOW (ref 3.5–5.1)
Sodium: 136 mmol/L (ref 135–145)
Total Bilirubin: 0.9 mg/dL (ref 0.0–1.2)
Total Protein: 7.8 g/dL (ref 6.5–8.1)

## 2023-10-25 LAB — LIPASE, BLOOD: Lipase: 30 U/L (ref 11–51)

## 2023-10-25 LAB — CBC
HCT: 42.6 % (ref 36.0–46.0)
Hemoglobin: 14.7 g/dL (ref 12.0–15.0)
MCH: 31.6 pg (ref 26.0–34.0)
MCHC: 34.5 g/dL (ref 30.0–36.0)
MCV: 91.6 fL (ref 80.0–100.0)
Platelets: 389 K/uL (ref 150–400)
RBC: 4.65 MIL/uL (ref 3.87–5.11)
RDW: 12.6 % (ref 11.5–15.5)
WBC: 9.2 K/uL (ref 4.0–10.5)
nRBC: 0 % (ref 0.0–0.2)

## 2023-10-25 LAB — I-STAT VENOUS BLOOD GAS, ED
Acid-base deficit: 9 mmol/L — ABNORMAL HIGH (ref 0.0–2.0)
Bicarbonate: 15.9 mmol/L — ABNORMAL LOW (ref 20.0–28.0)
Calcium, Ion: 1.14 mmol/L — ABNORMAL LOW (ref 1.15–1.40)
HCT: 40 % (ref 36.0–46.0)
Hemoglobin: 13.6 g/dL (ref 12.0–15.0)
O2 Saturation: 90 %
Potassium: 3.5 mmol/L (ref 3.5–5.1)
Sodium: 139 mmol/L (ref 135–145)
TCO2: 17 mmol/L — ABNORMAL LOW (ref 22–32)
pCO2, Ven: 30.7 mmHg — ABNORMAL LOW (ref 44–60)
pH, Ven: 7.321 (ref 7.25–7.43)
pO2, Ven: 63 mmHg — ABNORMAL HIGH (ref 32–45)

## 2023-10-25 LAB — HIV ANTIBODY (ROUTINE TESTING W REFLEX): HIV Screen 4th Generation wRfx: NONREACTIVE

## 2023-10-25 LAB — I-STAT CG4 LACTIC ACID, ED
Lactic Acid, Venous: 5.3 mmol/L (ref 0.5–1.9)
Lactic Acid, Venous: 3.9 mmol/L (ref 0.5–1.9)

## 2023-10-25 LAB — LACTIC ACID, PLASMA: Lactic Acid, Venous: 0.8 mmol/L (ref 0.5–1.9)

## 2023-10-25 MED ORDER — ONDANSETRON HCL 4 MG/2ML IJ SOLN
4.0000 mg | Freq: Four times a day (QID) | INTRAMUSCULAR | Status: DC | PRN
  Administered 2023-10-25: 4 mg via INTRAVENOUS
  Filled 2023-10-25: qty 2

## 2023-10-25 MED ORDER — LACTATED RINGERS IV BOLUS
1000.0000 mL | Freq: Once | INTRAVENOUS | Status: AC
  Administered 2023-10-25: 1000 mL via INTRAVENOUS

## 2023-10-25 MED ORDER — POTASSIUM CHLORIDE CRYS ER 20 MEQ PO TBCR
40.0000 meq | EXTENDED_RELEASE_TABLET | ORAL | Status: DC
  Filled 2023-10-25: qty 2

## 2023-10-25 MED ORDER — POTASSIUM CHLORIDE CRYS ER 20 MEQ PO TBCR
20.0000 meq | EXTENDED_RELEASE_TABLET | ORAL | Status: AC
  Administered 2023-10-25: 20 meq via ORAL
  Filled 2023-10-25: qty 1

## 2023-10-25 MED ORDER — PIPERACILLIN-TAZOBACTAM 3.375 G IVPB
3.3750 g | Freq: Three times a day (TID) | INTRAVENOUS | Status: DC
  Administered 2023-10-25 – 2023-10-27 (×6): 3.375 g via INTRAVENOUS
  Filled 2023-10-25 (×6): qty 50

## 2023-10-25 MED ORDER — LACTATED RINGERS IV SOLN: INTRAVENOUS | Status: DC

## 2023-10-25 MED ORDER — OXYCODONE-ACETAMINOPHEN 5-325 MG PO TABS: 1.0000 | ORAL_TABLET | Freq: Four times a day (QID) | ORAL | Status: DC | PRN

## 2023-10-25 MED ORDER — SODIUM CHLORIDE 0.9% FLUSH
3.0000 mL | Freq: Two times a day (BID) | INTRAVENOUS | Status: DC
  Administered 2023-10-26: 3 mL via INTRAVENOUS

## 2023-10-25 MED ORDER — ONDANSETRON HCL 4 MG PO TABS: 4.0000 mg | ORAL_TABLET | Freq: Four times a day (QID) | ORAL | Status: DC | PRN

## 2023-10-25 MED ORDER — ENOXAPARIN SODIUM 40 MG/0.4ML IJ SOSY
40.0000 mg | PREFILLED_SYRINGE | INTRAMUSCULAR | Status: DC
  Filled 2023-10-25 (×2): qty 0.4

## 2023-10-25 MED ORDER — METRONIDAZOLE 500 MG/100ML IV SOLN
500.0000 mg | Freq: Once | INTRAVENOUS | Status: AC
  Administered 2023-10-25: 500 mg via INTRAVENOUS
  Filled 2023-10-25: qty 100

## 2023-10-25 MED ORDER — HYDROMORPHONE HCL 1 MG/ML IJ SOLN
0.5000 mg | INTRAMUSCULAR | Status: DC | PRN
  Administered 2023-10-26 – 2023-10-27 (×3): 0.5 mg via INTRAVENOUS
  Filled 2023-10-25 (×3): qty 0.5

## 2023-10-25 MED ORDER — IOHEXOL 350 MG/ML SOLN
75.0000 mL | Freq: Once | INTRAVENOUS | Status: AC | PRN
  Administered 2023-10-25: 75 mL via INTRAVENOUS

## 2023-10-25 MED ORDER — ONDANSETRON HCL 4 MG/2ML IJ SOLN
4.0000 mg | Freq: Once | INTRAMUSCULAR | Status: AC
  Administered 2023-10-25: 4 mg via INTRAVENOUS
  Filled 2023-10-25: qty 2

## 2023-10-25 MED ORDER — PIPERACILLIN-TAZOBACTAM 3.375 G IVPB 30 MIN: 3.3750 g | Freq: Three times a day (TID) | INTRAVENOUS | Status: DC

## 2023-10-25 MED ORDER — HYDROMORPHONE HCL 1 MG/ML IJ SOLN
1.0000 mg | Freq: Once | INTRAMUSCULAR | Status: AC
  Administered 2023-10-25: 1 mg via INTRAVENOUS
  Filled 2023-10-25: qty 1

## 2023-10-25 MED ORDER — MORPHINE SULFATE (PF) 4 MG/ML IV SOLN
4.0000 mg | Freq: Once | INTRAVENOUS | Status: AC
  Administered 2023-10-25: 4 mg via INTRAVENOUS
  Filled 2023-10-25: qty 1

## 2023-10-25 MED ORDER — ACETAMINOPHEN 650 MG RE SUPP: 650.0000 mg | Freq: Four times a day (QID) | RECTAL | Status: DC | PRN

## 2023-10-25 MED ORDER — ALBUTEROL SULFATE (2.5 MG/3ML) 0.083% IN NEBU: 2.5000 mg | INHALATION_SOLUTION | Freq: Four times a day (QID) | RESPIRATORY_TRACT | Status: DC | PRN

## 2023-10-25 MED ORDER — ACETAMINOPHEN 325 MG PO TABS: 650.0000 mg | ORAL_TABLET | Freq: Four times a day (QID) | ORAL | Status: DC | PRN

## 2023-10-25 NOTE — ED Notes (Signed)
 ED TO INPATIENT HANDOFF REPORT    S Name/Age/Gender Holly Brown 38 y.o. female Room/Bed: 023C/023C  Code Status   Code Status: Full Code  Home/SNF/Other Home Patient oriented to: self, place, time, and situation Is this baseline? Yes   Triage Complete: Triage complete  Chief Complaint Colitis [K52.9]  Triage Note The pt is c/o lt sided abd pain for one hour  nausea and vomiting  lmp hysterectomy    Allergies Allergies  Allergen Reactions   Keflex  [Cephalexin ]     Itching     Level of Care/Admitting Diagnosis ED Disposition     ED Disposition  Admit   Condition  --   Comment  Hospital Area: [facility] [[id number]]  Level of Care: Med-Surg [16]  May admit patient to [facility] or [facility] if equivalent level of care is available:: No  Covid Evaluation: Asymptomatic - no recent exposure (last 10 days) testing not required  Diagnosis: Colitis [807065]  Admitting Physician: CLAUDENE MAXIMINO LABOR [[id number]]  Attending Physician: CLAUDENE MAXIMINO LABOR [[id number]]  Certification:: I certify this patient will need inpatient services for at least 2 midnights  Expected Medical Readiness: 10/27/2023          B Medical/Surgery History Past Medical History:  Diagnosis Date   Anxiety    Depression    Gestational diabetes    metformin  - resolved after delivery   History of UTI    HSV (herpes simplex virus) infection    Migraines    Peripheral vascular disease (HCC)    Post-operative nausea and vomiting 12/19/2022   Seizures (HCC)    Jan.2016 - only had one seizure, none since   Sleep apnea    Vaginal Pap smear, abnormal    Past Surgical History:  Procedure Laterality Date   ABDOMINAL HYSTERECTOMY  12/16/2022   BIOPSY  12/24/2022   Procedure: BIOPSY;  Surgeon: Leigh Elspeth SQUIBB, MD;  Location: [facility];  Service: Gastroenterology;;   DILATION AND CURETTAGE OF UTERUS     ESOPHAGOGASTRODUODENOSCOPY (EGD) WITH PROPOFOL  N/A 12/24/2022    Procedure: ESOPHAGOGASTRODUODENOSCOPY (EGD) WITH PROPOFOL ;  Surgeon: Leigh Elspeth SQUIBB, MD;  Location: [facility];  Service: Gastroenterology;  Laterality: N/A;   LAPAROSCOPIC TUBAL LIGATION Bilateral 12/08/2018   Procedure: LAPAROSCOPIC TUBAL LIGATION;  Surgeon: Alger Gong, MD;  Location: [facility];  Service: Gynecology;  Laterality: Bilateral;   LEEP N/A 03/18/2022   Procedure: LOOP ELECTROSURGICAL EXCISION PROCEDURE (LEEP);  Surgeon: Alger Gong, MD;  Location: [facility];  Service: Gynecology;  Laterality: N/A;   TUBAL LIGATION     VAGINAL HYSTERECTOMY N/A 12/16/2022   Procedure: TOTAL VAGINAL HYSTERECTOMY, BILATERAL SALPINGECTOMY;  Surgeon: Lorence Ozell CROME, MD;  Location: [facility];  Service: Gynecology;  Laterality: N/A;   WISDOM TOOTH EXTRACTION  04/21/2009     A IV Location/Drains/Wounds Patient Lines/Drains/Airways Status     Active Line/Drains/Airways     Name Placement date Placement time Site Days   Peripheral IV 10/25/23 20 G Left Antecubital 10/25/23  0108  Antecubital  less than 1   Incision - 5 Ports Abdomen 1: Right;Lateral;Lower 2: Right;Lateral;Medial 3: Upper;Umbilicus 4: Left;Upper;Medial 5: Left;Lateral;Lower 02/11/23  1350  -- 256            Intake/Output Last 24 hours  Intake/Output Summary (Last 24 hours) at 10/25/2023 1302 Last data filed at 10/25/2023 0702 Gross per 24 hour  Intake 1098.74 ml  Output --  Net 1098.74 ml    Labs/Imaging Results for orders placed or performed  during the hospital encounter of 10/25/23 (from the past 48 hours)  Lipase, blood     Status: None   Collection Time: 10/25/23 12:31 AM  Result Value Ref Range   Lipase 30 11 - 51 U/L    Comment: Performed at [facility], [address] [place], [place] [address]  Comprehensive metabolic panel     Status: Abnormal   Collection Time: 10/25/23 12:31 AM  Result Value Ref Range   Sodium 136 135 - 145 mmol/L   Potassium 3.3 (L) 3.5 - 5.1 mmol/L    Chloride 102 98 - 111 mmol/L   CO2 15 (L) 22 - 32 mmol/L   Glucose, Bld 145 (H) 70 - 99 mg/dL    Comment: Glucose reference range applies only to samples taken after fasting for at least 8 hours.   BUN 8 6 - 20 mg/dL   Creatinine, Ser 9.01 0.44 - 1.00 mg/dL   Calcium 9.6 8.9 - 89.6 mg/dL   Total Protein 7.8 6.5 - 8.1 g/dL   Albumin 4.2 3.5 - 5.0 g/dL   AST 20 15 - 41 U/L   ALT 18 0 - 44 U/L   Alkaline Phosphatase 55 38 - 126 U/L   Total Bilirubin 0.9 0.0 - 1.2 mg/dL   GFR, Estimated >39 >39 mL/min    Comment: (NOTE) Calculated using the CKD-EPI Creatinine Equation (2021)    Anion gap 19 (H) 5 - 15    Comment: Performed at [facility], [address] [place], [place] [address]  CBC     Status: None   Collection Time: 10/25/23 12:31 AM  Result Value Ref Range   WBC 9.2 4.0 - 10.5 K/uL   RBC 4.65 3.87 - 5.11 MIL/uL   Hemoglobin 14.7 12.0 - 15.0 g/dL   HCT 57.3 63.9 - 53.9 %   MCV 91.6 80.0 - 100.0 fL   MCH 31.6 26.0 - 34.0 pg   MCHC 34.5 30.0 - 36.0 g/dL   RDW 87.3 88.4 - 84.4 %   Platelets 389 150 - 400 K/uL   nRBC 0.0 0.0 - 0.2 %    Comment: Performed at [facility], [address] [place], [place] [address]  I-Stat venous blood gas, [facility], [facility], [facility])     Status: Abnormal   Collection Time: 10/25/23  2:19 AM  Result Value Ref Range   pH, Ven 7.321 7.25 - 7.43   pCO2, Ven 30.7 (L) 44 - 60 mmHg   pO2, Ven 63 (H) 32 - 45 mmHg   Bicarbonate 15.9 (L) 20.0 - 28.0 mmol/L   TCO2 17 (L) 22 - 32 mmol/L   O2 Saturation 90 %   Acid-base deficit 9.0 (H) 0.0 - 2.0 mmol/L   Sodium 139 135 - 145 mmol/L   Potassium 3.5 3.5 - 5.1 mmol/L   Calcium, Ion 1.14 (L) 1.15 - 1.40 mmol/L   HCT 40.0 36.0 - 46.0 %   Hemoglobin 13.6 12.0 - 15.0 g/dL   Sample type VENOUS   I-Stat CG4 Lactic Acid     Status: Abnormal   Collection Time: 10/25/23  2:20 AM  Result Value Ref Range   Lactic Acid, Venous 5.3 (HH) 0.5 - 1.9 mmol/L   Comment NOTIFIED PHYSICIAN   Urinalysis, Routine w  reflex microscopic -Urine, Clean Catch     Status: Abnormal   Collection Time: 10/25/23  2:56 AM  Result Value Ref Range   Color, Urine YELLOW YELLOW   APPearance CLEAR  CLEAR   Specific Gravity, Urine 1.024 1.005 - 1.030   pH 6.0 5.0 - 8.0   Glucose, UA >=500 (A) NEGATIVE mg/dL   Hgb urine dipstick SMALL (A) NEGATIVE   Bilirubin Urine NEGATIVE NEGATIVE   Ketones, ur 5 (A) NEGATIVE mg/dL   Protein, ur NEGATIVE NEGATIVE mg/dL   Nitrite NEGATIVE NEGATIVE   Leukocytes,Ua NEGATIVE NEGATIVE   RBC / HPF 6-10 0 - 5 RBC/hpf   WBC, UA 0-5 0 - 5 WBC/hpf   Bacteria, UA RARE (A) NONE SEEN   Squamous Epithelial / HPF 0-5 0 - 5 /HPF   Mucus PRESENT     Comment: Performed at [facility], [address], [place], [place] [address]  I-Stat CG4 Lactic Acid     Status: Abnormal   Collection Time: 10/25/23  4:46 AM  Result Value Ref Range   Lactic Acid, Venous 3.9 (HH) 0.5 - 1.9 mmol/L   Comment NOTIFIED PHYSICIAN    US  PELVIC COMPLETE W TRANSVAGINAL AND TORSION R/O Result Date: 10/25/2023 CLINICAL DATA:  Abdominal pain ovarian cyst on CT. EXAM: TRANSABDOMINAL AND TRANSVAGINAL ULTRASOUND OF PELVIS DOPPLER ULTRASOUND OF OVARIES TECHNIQUE: Both transabdominal and transvaginal ultrasound examinations of the pelvis were performed. Transabdominal technique was performed for global imaging of the pelvis including uterus, ovaries, adnexal regions, and pelvic cul-de-sac. It was necessary to proceed with endovaginal exam following the transabdominal exam to visualize the right ovary. Color and duplex Doppler ultrasound was utilized to evaluate blood flow to the ovaries. COMPARISON:  None Available. FINDINGS: Uterus Status post hysterectomy. Endometrium Not applicable Right ovary Measurements: 2.9 x 1.3 x 1.7 cm = volume: 3.4 mL. Normal appearance/no adnexal mass. Left ovary Measurements: 5.0 x 3.4 x 3.5 cm = volume: 31.1 mL. Complex cyst measures 3.5 x 2.6 x 2.6 cm. This has diffuse low-level internal echoes with  a lace-like architecture. Pulsed Doppler evaluation of both ovaries demonstrates normal low-resistance arterial and venous waveforms. Other findings No abnormal free fluid. IMPRESSION: 1. No evidence for ovarian torsion. 2. Complex left ovarian cyst measures 3.5 x 2.6 x 2.6 cm. This has diffuse low-level internal echoes with a lace-like architecture compatible with a benign hemorrhagic cyst. Consider short-interval follow up ultrasound in 6-12 weeks, preferably during the week following the patient's normal menses. 3. Status post hysterectomy. Electronically Signed   By: Waddell Calk M.D.   On: 10/25/2023 04:18   CT ABDOMEN PELVIS W CONTRAST Result Date: 10/25/2023 CLINICAL DATA:  Left lower quadrant abdominal pain. EXAM: CT ABDOMEN AND PELVIS WITH CONTRAST TECHNIQUE: Multidetector CT imaging of the abdomen and pelvis was performed using the standard protocol following bolus administration of intravenous contrast. RADIATION DOSE REDUCTION: This exam was performed according to the departmental dose-optimization program which includes automated exposure control, adjustment of the mA and/or kV according to patient size and/or use of iterative reconstruction technique. CONTRAST:  75mL OMNIPAQUE  IOHEXOL  350 MG/ML SOLN COMPARISON:  CT pelvis 12/29/2022.  CT abdomen and pelvis 12/28/2022 FINDINGS: Lower chest: Lung bases are clear.  Small esophageal hiatal hernia. Hepatobiliary: Focal area of low-attenuation in the medial segment left lobe of liver consistent with focal fatty infiltration. Gallbladder and bile ducts are normal. Pancreas: Unremarkable. No pancreatic ductal dilatation or surrounding inflammatory changes. Spleen: Normal in size without focal abnormality. Adrenals/Urinary Tract: Adrenal glands are unremarkable. Kidneys are normal, without renal calculi, focal lesion, or hydronephrosis. Bladder is unremarkable. Stomach/Bowel: Stomach, small bowel, and colon are not abnormally distended. Under distention  limits evaluation but the colonic wall  appears to demonstrate diffuse wall thickening and edema. This is likely infectious or inflammatory colitis. Pseudomembranous colitis would be a secondary consideration in the appropriate clinical setting. No evidence of obstruction. Appendix is normal. Vascular/Lymphatic: No significant vascular findings are present. No enlarged abdominal or pelvic lymph nodes. Reproductive: Uterus is surgically absent. 3.8 cm diameter left ovarian cyst with peripheral enhancement probably representing a physiologic cyst. Other: No free air or free fluid in the abdomen. Abdominal wall musculature appears intact. Musculoskeletal: No acute or significant osseous findings. IMPRESSION: 1. Diffuse edematous colonic wall thickening likely representing infectious or inflammatory colitis. Pseudomembranous colitis could be another consideration in the appropriate clinical setting. 2. 3.8 cm thin walled peripherally enhancing left ovarian cyst. No follow-up imaging recommended. Note: This recommendation does not apply to premenarchal patients and to those with increased risk (genetic, family history, elevated tumor markers or other high-risk factors) of ovarian cancer. Reference: JACR 2020 Feb; 17(2):248-254 3. Focal fatty infiltration in the liver. 4. Small esophageal hiatal hernia. Electronically Signed   By: Elsie Gravely M.D.   On: 10/25/2023 02:48    Pending Labs Unresulted Labs (From admission, onward)     Start     Ordered   10/26/23 0500  CBC  Tomorrow morning,   R        10/25/23 0952   10/26/23 0500  Basic metabolic panel  Tomorrow morning,   R        10/25/23 0952   10/25/23 1118  C Difficile Quick Screen w PCR reflex  (C Difficile quick screen w PCR reflex panel )  Once, for 24 hours,   TIMED       References:    CDiff Information Tool   10/25/23 1117   10/25/23 1059  Lactic acid, plasma  (Lactic Acid)  Once,   R        10/25/23 1058   10/25/23 0948  HIV Antibody (routine  testing w rflx)  (HIV Antibody (Routine testing w reflex) panel)  Once,   R        10/25/23 0952            Vitals/Pain Today's Vitals   10/25/23 1008 10/25/23 1030 10/25/23 1215 10/25/23 1230  BP: (!) 101/49 (!) 111/58 (!) 108/57 105/64  Pulse: 77 (!) 58  (!) 58  Resp: 14 16 20 18   Temp: 98 F (36.7 C)     TempSrc: Oral     SpO2: 99% 100%  99%  Weight:      Height:      PainSc:        Isolation Precautions Enteric precautions (UV disinfection)  Medications Medications  lactated ringers  infusion ( Intravenous New Bag/Given 10/25/23 0559)  piperacillin -tazobactam (ZOSYN ) IVPB 3.375 g (3.375 g Intravenous New Bag/Given 10/25/23 1007)  enoxaparin  (LOVENOX ) injection 40 mg (40 mg Subcutaneous Patient Refused/Not Given 10/25/23 1013)  sodium chloride  flush (NS) 0.9 % injection 3 mL (3 mLs Intravenous Not Given 10/25/23 1008)  acetaminophen  (TYLENOL ) tablet 650 mg (has no administration in time range)    Or  acetaminophen  (TYLENOL ) suppository 650 mg (has no administration in time range)  ondansetron  (ZOFRAN ) tablet 4 mg ( Oral See Alternative 10/25/23 1145)    Or  ondansetron  (ZOFRAN ) injection 4 mg (4 mg Intravenous Given 10/25/23 1145)  albuterol  (PROVENTIL ) (2.5 MG/3ML) 0.083% nebulizer solution 2.5 mg (has no administration in time range)  oxyCODONE -acetaminophen  (PERCOCET/ROXICET) 5-325 MG per tablet 1 tablet (has no administration in time range)  HYDROmorphone  (DILAUDID ) injection 0.5  mg (has no administration in time range)  potassium chloride  SA (KLOR-CON  M) CR tablet 40 mEq (has no administration in time range)  lactated ringers  bolus 1,000 mL (0 mLs Intravenous Stopped 10/25/23 0211)  morphine  (PF) 4 MG/ML injection 4 mg (4 mg Intravenous Given 10/25/23 0111)  ondansetron  (ZOFRAN ) injection 4 mg (4 mg Intravenous Given 10/25/23 0110)  HYDROmorphone  (DILAUDID ) injection 1 mg (1 mg Intravenous Given 10/25/23 0204)  iohexol  (OMNIPAQUE ) 350 MG/ML injection 75 mL (75 mLs Intravenous  Contrast Given 10/25/23 0232)  lactated ringers  bolus 1,000 mL (0 mLs Intravenous Stopped 10/25/23 0700)  metroNIDAZOLE  (FLAGYL ) IVPB 500 mg (0 mg Intravenous Stopped 10/25/23 9297)    Mobility walks      R Recommendations: See Admitting Provider Note

## 2023-10-25 NOTE — ED Notes (Signed)
 Patient transported to Ultrasound

## 2023-10-25 NOTE — ED Triage Notes (Signed)
 The pt is c/o lt sided abd pain for one hour  nausea and vomiting  lmp hysterectomy

## 2023-10-25 NOTE — ED Provider Notes (Signed)
  Physical Exam  BP 120/75   Pulse 72   Temp 98.5 F (36.9 C) (Oral)   Resp 14   Ht 5' 10 (1.778 m)   Wt 102.3 kg   LMP  (LMP Unknown) Comment: Depo Injections - last injection 10/2022, had spotting on and off since 10/2022 injection  SpO2 100%   BMI 32.36 kg/m   Physical Exam Vitals and nursing note reviewed.  HENT:     Head: Normocephalic and atraumatic.  Eyes:     Pupils: Pupils are equal, round, and reactive to light.  Cardiovascular:     Rate and Rhythm: Normal rate and regular rhythm.  Pulmonary:     Effort: Pulmonary effort is normal.     Breath sounds: Normal breath sounds.  Abdominal:     Palpations: Abdomen is soft.     Tenderness: There is no abdominal tenderness.  Skin:    General: Skin is warm and dry.  Neurological:     Mental Status: She is alert.  Psychiatric:        Mood and Affect: Mood normal.     Procedures  Procedures  ED Course / MDM   Clinical Course as of 10/25/23 0926  [place] Oct 25, 2023  0925 Discussed with Dr. Claudene who accept patient for admission medicine.  Reevaluated patient.  Pain well-controlled at this time she remains hemodynamically stable [MP]    Clinical Course User Index [MP] Pamella Ozell LABOR, DO   Medical Decision Making I, Ozell Pamella DO, have assumed care of this patient from the previous provider pending admission to medicine  Amount and/or Complexity of Data Reviewed Radiology: ordered.  Risk Prescription drug management. Decision regarding hospitalization.          Pamella Ozell LABOR, DO 10/25/23 [phone]

## 2023-10-25 NOTE — ED Provider Notes (Signed)
 [place] [facility] Provider Note  CSN: [id number] Arrival date & time: 10/24/23 2359  Chief Complaint(s) Abdominal Pain  HPI Holly Brown is a 38 y.o. female with PMH cervical cancer status post hysterectomy and salpingectomy who presents emergency room for evaluation of severe abdominal pain.  Patient states that approximately 1 hour ago pain began abruptly in the left upper and left lower quadrant with associated nausea and vomiting.  Patient arrives in extremis with significant abdominal pain writhing around in the bed.  Denies chest pain, shortness of breath, headache, fever, other systemic symptoms.   Past Medical History Past Medical History:  Diagnosis Date   Anxiety    Depression    Gestational diabetes    metformin  - resolved after delivery   History of UTI    HSV (herpes simplex virus) infection    Migraines    Peripheral vascular disease (HCC)    Post-operative nausea and vomiting 12/19/2022   Seizures (HCC)    Jan.2016 - only had one seizure, none since   Sleep apnea    Vaginal Pap smear, abnormal    Patient Active Problem List   Diagnosis Date Noted   Blurry vision, bilateral 10/16/2023   Abnormal uterine bleeding (AUB) 10/16/2023   Nausea 09/04/2023   Diarrhea 09/04/2023   Frequent urination 09/04/2023   Lower abdominal pain 07/24/2023   Rectal bleed 07/24/2023   Left arm weakness 05/08/2023   History of cervical dysplasia 05/08/2023   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   Left sided sciatica 04/23/2023   Low magnesium  level 04/07/2023   Vaginal irritation 01/28/2023   Cervical cancer (HCC) 12/29/2022   Duodenal ulcer 12/24/2022   S/P hysterectomy 12/19/2022   History of syncope 12/10/2022   Chronic pain of left knee 12/10/2022   Family history of colon cancer 10/10/2015   Home Medication(s) Prior to Admission medications   Medication Sig Start Date End Date Taking? Authorizing Provider   amoxicillin -clavulanate (AUGMENTIN  ES-600) 600-42.9 MG/5ML suspension Take 7.3 mLs (875 mg total) by mouth 2 (two) times daily for 10 days. 10/16/23 10/26/23  Dugal, Tabitha, FNP  dicyclomine  (BENTYL ) 20 MG tablet Take 1 tablet (20 mg total) by mouth 3 (three) times daily before meals. 09/07/23 12/06/23  [place], PA-C  famotidine  (PEPCID ) 20 MG tablet Take 1 tablet (20 mg total) by mouth 2 (two) times daily. 09/04/23   Tower, Laine LABOR, MD  Multiple Vitamins-Minerals (HAIR SKIN AND NAILS FORMULA PO) Take 1 tablet by mouth daily.    [provider]  ondansetron  (ZOFRAN ) 4 MG tablet Take 1 tablet (4 mg total) by mouth every 8 (eight) hours as needed for nausea or vomiting. 09/07/23   [place], PA-C                                                                                                                                    Past Surgical History  Past Surgical History:  Procedure Laterality Date   ABDOMINAL HYSTERECTOMY  12/16/2022   BIOPSY  12/24/2022   Procedure: BIOPSY;  Surgeon: Leigh Elspeth SQUIBB, MD;  Location: [facility];  Service: Gastroenterology;;   DILATION AND CURETTAGE OF UTERUS     ESOPHAGOGASTRODUODENOSCOPY (EGD) WITH PROPOFOL  N/A 12/24/2022   Procedure: ESOPHAGOGASTRODUODENOSCOPY (EGD) WITH PROPOFOL ;  Surgeon: Leigh Elspeth SQUIBB, MD;  Location: [facility];  Service: Gastroenterology;  Laterality: N/A;   LAPAROSCOPIC TUBAL LIGATION Bilateral 12/08/2018   Procedure: LAPAROSCOPIC TUBAL LIGATION;  Surgeon: Alger Gong, MD;  Location: [facility];  Service: Gynecology;  Laterality: Bilateral;   LEEP N/A 03/18/2022   Procedure: LOOP ELECTROSURGICAL EXCISION PROCEDURE (LEEP);  Surgeon: Alger Gong, MD;  Location: [facility];  Service: Gynecology;  Laterality: N/A;   TUBAL LIGATION     VAGINAL HYSTERECTOMY N/A 12/16/2022   Procedure: TOTAL VAGINAL HYSTERECTOMY, BILATERAL SALPINGECTOMY;  Surgeon: Lorence Ozell CROME, MD;  Location: [facility];  Service:  Gynecology;  Laterality: N/A;   WISDOM TOOTH EXTRACTION  04/21/2009   Family History Family History  Problem Relation Age of Onset   Diabetes Mother    Stroke Mother    Colon cancer Father 6   Cancer Father    Hypertension Maternal Grandmother    Diabetes Maternal Grandmother    Hypertension Paternal Grandmother    Esophageal cancer Neg Hx    Liver disease Neg Hx     Social History Social History   Tobacco Use   Smoking status: Former    Current packs/day: 0.25    Average packs/day: 0.3 packs/day for 11.8 years (3.0 ttl pk-yrs)    Types: Cigarettes    Start date: 12/21/2011    Passive exposure: Never   Smokeless tobacco: Never  Vaping Use   Vaping status: Never Used  Substance Use Topics   Alcohol use: Yes    Comment: ocassionally    Drug use: Not Currently    Types: Marijuana    Comment: Last use was in 2023   Allergies Keflex  [cephalexin ]  Review of Systems Review of Systems  Gastrointestinal:  Positive for abdominal pain, nausea and vomiting.    Physical Exam Vital Signs  I have reviewed the triage vital signs BP 135/86   Pulse 70   Temp 97.6 F (36.4 C) (Axillary)   Resp 19   Ht 5' 10 (1.778 m)   Wt 102.3 kg   LMP  (LMP Unknown) Comment: Depo Injections - last injection 10/2022, had spotting on and off since 10/2022 injection  SpO2 100%   BMI 32.36 kg/m   Physical Exam Vitals and nursing note reviewed.  Constitutional:      General: She is in acute distress.     Appearance: She is well-developed. She is ill-appearing.  HENT:     Head: Normocephalic and atraumatic.  Eyes:     Conjunctiva/sclera: Conjunctivae normal.  Cardiovascular:     Rate and Rhythm: Normal rate and regular rhythm.     Heart sounds: No murmur heard. Pulmonary:     Effort: Pulmonary effort is normal. No respiratory distress.     Breath sounds: Normal breath sounds.  Abdominal:     Palpations: Abdomen is soft.     Tenderness: There is generalized abdominal tenderness.  There is guarding.  Musculoskeletal:        General: No swelling.     Cervical back: Neck supple.  Skin:    General: Skin is warm and dry.     Capillary Refill: Capillary refill takes  less than 2 seconds.  Neurological:     Mental Status: She is alert.  Psychiatric:        Mood and Affect: Mood normal.     ED Results and Treatments Labs (all labs ordered are listed, but only abnormal results are displayed) Labs Reviewed  COMPREHENSIVE METABOLIC PANEL WITH GFR - Abnormal; Notable for the following components:      Result Value   Potassium 3.3 (*)    CO2 15 (*)    Glucose, Bld 145 (*)    Anion gap 19 (*)    All other components within normal limits  LIPASE, BLOOD  CBC  URINALYSIS, ROUTINE W REFLEX MICROSCOPIC  I-STAT CG4 LACTIC ACID, ED  I-STAT VENOUS BLOOD GAS, ED                                                                                                                          Radiology No results found.  Pertinent labs & imaging results that were available during my care of the patient were reviewed by me and considered in my medical decision making (see MDM for details).  Medications Ordered in ED Medications  HYDROmorphone  (DILAUDID ) injection 1 mg (has no administration in time range)  lactated ringers  bolus 1,000 mL (1,000 mLs Intravenous New Bag/Given 10/25/23 0112)  morphine  (PF) 4 MG/ML injection 4 mg (4 mg Intravenous Given 10/25/23 0111)  ondansetron  (ZOFRAN ) injection 4 mg (4 mg Intravenous Given 10/25/23 0110)                                                                                                                                     Procedures .Critical Care  Performed by: Albertina Dixon, MD Authorized by: Albertina Dixon, MD   Critical care provider statement:    Critical care time (minutes):  30   Critical care was necessary to treat or prevent imminent or life-threatening deterioration of the following conditions:  Dehydration   Critical care  was time spent personally by me on the following activities:  Development of treatment plan with patient or surrogate, discussions with consultants, evaluation of patient's response to treatment, examination of patient, ordering and review of laboratory studies, ordering and review of radiographic studies, ordering and performing treatments and interventions, pulse oximetry, re-evaluation of patient's condition and review of old charts   (including critical care time)  Medical Decision Making / ED Course  This patient presents to the ED for concern of abdominal pain, this involves an extensive number of treatment options, and is a complaint that carries with it a high risk of complications and morbidity.  The differential diagnosis includes diverticulitis, epiploic appendagitis, colitis, gastroenteritis, constipation, nephrolithiasis, inflammatory bowel disease, ovarian torsion, TOA/PID, ectopic pregnancy,   MDM: Patient seen emergency room for evaluation of abdominal pain.  Physical exam reveals an ill-appearing patient in extremis with significant tenderness to palpation.  Laboratory evaluation with no significant leukocytosis, mild hypokalemia to 3.3, CO2 is 15 likely secondary to lactic acidosis and initial lactic 5.3.  2 L of fluid given and repeat 3.9.  CT abdomen pelvis concerning for colitis with a new ovarian cyst.  Given severity of initial presentation, a pelvic ultrasound was obtained to rule out torsion and this was reassuringly negative for torsion.  Patient was pain controlled but symptoms are persistent in the setting of significant elevated lactic acid, will require hospital admission for rehydration.  Flagyl  started for colitis and patient admitted   Additional history obtained:  -External records from outside source obtained and reviewed including: Chart review including previous notes, labs, imaging, consultation notes   Lab Tests: -I ordered, reviewed, and interpreted  labs.   The pertinent results include:   Labs Reviewed  COMPREHENSIVE METABOLIC PANEL WITH GFR - Abnormal; Notable for the following components:      Result Value   Potassium 3.3 (*)    CO2 15 (*)    Glucose, Bld 145 (*)    Anion gap 19 (*)    All other components within normal limits  LIPASE, BLOOD  CBC  URINALYSIS, ROUTINE W REFLEX MICROSCOPIC  I-STAT CG4 LACTIC ACID, ED  I-STAT VENOUS BLOOD GAS, ED      Imaging Studies ordered: I ordered imaging studies including CTAP I independently visualized and interpreted imaging. I agree with the radiologist interpretation   Medicines ordered and prescription drug management: Meds ordered this encounter  Medications   lactated ringers  bolus 1,000 mL   morphine  (PF) 4 MG/ML injection 4 mg   ondansetron  (ZOFRAN ) injection 4 mg   HYDROmorphone  (DILAUDID ) injection 1 mg    -I have reviewed the patients home medicines and have made adjustments as needed  Critical interventions Fluid resuscitation    Cardiac Monitoring: The patient was maintained on a cardiac monitor.  I personally viewed and interpreted the cardiac monitored which showed an underlying rhythm of: NSR  Social Determinants of Health:  Factors impacting patients care include: none   Reevaluation: After the interventions noted above, I reevaluated the patient and found that they have :improved  Co morbidities that complicate the patient evaluation  Past Medical History:  Diagnosis Date   Anxiety    Depression    Gestational diabetes    metformin  - resolved after delivery   History of UTI    HSV (herpes simplex virus) infection    Migraines    Peripheral vascular disease (HCC)    Post-operative nausea and vomiting 12/19/2022   Seizures (HCC)    Jan.2016 - only had one seizure, none since   Sleep apnea    Vaginal Pap smear, abnormal       Dispostion: I considered admission for this patient, and patient require hospital admission for elevated lactic  acid and colitis     Final Clinical Impression(s) / ED Diagnoses Final diagnoses:  None     @PCDICTATION @    Albertina Dixon, MD 10/25/23 [phone]

## 2023-10-25 NOTE — H&P (Addendum)
 History and Physical    Patient: Holly Brown FMW:[id number] DOB: [birth year] DOA: 10/25/2023 DOS: the patient was seen and examined on 10/25/2023 PCP: Corwin Antu, FNP  Patient coming from: Home  Chief Complaint:  Chief Complaint  Patient presents with   Abdominal Pain   HPI: Holly Brown is a 38 y.o. female with medical history significant of peripheral vascular disease, cervical cancer s/p hysterectomy 11/2022, colitis, anxiety, and depression presents with sudden onset left-sided abdominal pain and nausea.  The patient experienced a sudden onset of sharp, intermittent left-sided abdominal pain yesterday, described as resembling contractions and occurring every minute. Each episode is accompanied by nausea and vomiting. She has not experienced diarrhea, and bowel movements remain regular.  Patient reports having a similar episode previously, characterized by sudden abdominal pain, nausea, vomiting, and diarrhea, for which she was treated with antibiotics.  Review of records note that patient had been seen in the ED 09/2021 and discharged home with prescriptions of ciprofloxacin  after being found to have for left-sided colitis involving the descending and distal transverse colon on CT imaging.  She has been taking Augmentin  since June 26 for a sinus infection, which has resolved. She has managed to keep some liquids down today, but has not consumed much solid food recently. She describes still having a 'watery' taste in her mouth, associated with pre-vomiting sensations.  In the emergency department patient was noted to be afebrile with respirations 15-25, all other vital signs maintained.  Labs noted CBC within normal limits, lactic acid 5.3-> 3.9, potassium 3.3, CO2 15, and anion gap 19. CT scan of the abdomen pelvis noted diffuse edematous colonic wall thickening likely representing infectious or inflammatory colitis.  Urinalysis did not show significant signs for infection.  Patient  had been given 2 L of lactated Ringer 's, Zofran , morphine , Dilaudid , and metronidazole  500 mg IV.  Review of Systems: As mentioned in the history of present illness. All other systems reviewed and are negative. Past Medical History:  Diagnosis Date   Anxiety    Depression    Gestational diabetes    metformin  - resolved after delivery   History of UTI    HSV (herpes simplex virus) infection    Migraines    Peripheral vascular disease (HCC)    Post-operative nausea and vomiting 12/19/2022   Seizures (HCC)    Jan.2016 - only had one seizure, none since   Sleep apnea    Vaginal Pap smear, abnormal    Past Surgical History:  Procedure Laterality Date   ABDOMINAL HYSTERECTOMY  12/16/2022   BIOPSY  12/24/2022   Procedure: BIOPSY;  Surgeon: Leigh Elspeth SQUIBB, MD;  Location: [facility];  Service: Gastroenterology;;   DILATION AND CURETTAGE OF UTERUS     ESOPHAGOGASTRODUODENOSCOPY (EGD) WITH PROPOFOL  N/A 12/24/2022   Procedure: ESOPHAGOGASTRODUODENOSCOPY (EGD) WITH PROPOFOL ;  Surgeon: Leigh Elspeth SQUIBB, MD;  Location: [facility];  Service: Gastroenterology;  Laterality: N/A;   LAPAROSCOPIC TUBAL LIGATION Bilateral 12/08/2018   Procedure: LAPAROSCOPIC TUBAL LIGATION;  Surgeon: Alger Gong, MD;  Location: [facility];  Service: Gynecology;  Laterality: Bilateral;   LEEP N/A 03/18/2022   Procedure: LOOP ELECTROSURGICAL EXCISION PROCEDURE (LEEP);  Surgeon: Alger Gong, MD;  Location: [facility];  Service: Gynecology;  Laterality: N/A;   TUBAL LIGATION     VAGINAL HYSTERECTOMY N/A 12/16/2022   Procedure: TOTAL VAGINAL HYSTERECTOMY, BILATERAL SALPINGECTOMY;  Surgeon: Lorence Ozell CROME, MD;  Location: [facility];  Service: Gynecology;  Laterality: N/A;   WISDOM TOOTH EXTRACTION  04/21/2009  Social History:  reports that she has quit smoking. Her smoking use included cigarettes. She started smoking about 11 years ago. She has a 3 pack-year smoking history. She has never been  exposed to tobacco smoke. She has never used smokeless tobacco. She reports current alcohol use. She reports that she does not currently use drugs after having used the following drugs: Marijuana.  Allergies  Allergen Reactions   Keflex  [Cephalexin ]     Itching     Family History  Problem Relation Age of Onset   Diabetes Mother    Stroke Mother    Colon cancer Father 21   Cancer Father    Hypertension Maternal Grandmother    Diabetes Maternal Grandmother    Hypertension Paternal Grandmother    Esophageal cancer Neg Hx    Liver disease Neg Hx     Prior to Admission medications   Medication Sig Start Date End Date Taking? Authorizing Provider  amoxicillin -clavulanate (AUGMENTIN  ES-600) 600-42.9 MG/5ML suspension Take 7.3 mLs (875 mg total) by mouth 2 (two) times daily for 10 days. 10/16/23 10/26/23 Yes Dugal, Tabitha, FNP  famotidine  (PEPCID ) 20 MG tablet Take 1 tablet (20 mg total) by mouth 2 (two) times daily. Patient taking differently: Take 20 mg by mouth daily as needed for heartburn. 09/04/23  Yes Tower, Laine LABOR, MD  Multiple Vitamins-Minerals (HAIR SKIN AND NAILS FORMULA PO) Take 1 tablet by mouth daily.   Yes [provider]  ondansetron  (ZOFRAN ) 4 MG tablet Take 1 tablet (4 mg total) by mouth every 8 (eight) hours as needed for nausea or vomiting. 09/07/23  Yes [place], PA-C  dicyclomine  (BENTYL ) 20 MG tablet Take 1 tablet (20 mg total) by mouth 3 (three) times daily before meals. Patient not taking: Reported on 10/25/2023 09/07/23 12/06/23  [place], PA-C    Physical Exam: Vitals:   10/25/23 0200 10/25/23 0530 10/25/23 0602 10/25/23 0700  BP: (!) 105/90 116/68  120/75  Pulse: (!) 56 75  72  Resp: 18 15  14   Temp:   98.5 F (36.9 C)   TempSrc:   Oral   SpO2: 99% 100%  100%  Weight:      Height:       Constitutional: Young female currently in no acute distress Eyes: PERRL, lids and conjunctivae normal ENMT: Mucous membranes are moist. Normal dentition.   Neck: normal, supple  Respiratory: clear to auscultation bilaterally, no wheezing, no crackles. Normal respiratory effort. No accessory muscle use.  Cardiovascular: Regular rate and rhythm, no murmurs / rubs / gallops. No extremity edema. 2+ pedal pulses. No carotid bruits.  Abdomen: No significant guarding.  Bowel sounds present. Musculoskeletal: no clubbing / cyanosis. No joint deformity upper and lower extremities. Good ROM, no contractures. Normal muscle tone.  Skin: no rashes, lesions, ulcers. No induration Neurologic: CN 2-12 grossly intact. Strength 5/5 in all 4.  Psychiatric: Normal judgment and insight. Alert and oriented x 3. Normal mood.   Data Reviewed:  Reviewed labs, imaging, and pertinent records as documented.  Assessment and Plan:  Colitis Acute.  Patient presents with complaints of severe left-sided abdominal pain with nausea and vomiting.  She had been on Augmentin  since 6/26 for sinus infection.  CT scan of the abdomen pelvis significant for diffuse edematous colonic wall thickening likely representing infectious or inflammatory colitis.  Patient had initially been started on metronidazole .  Patient notes prior history of colitis similarly on the left side back in 09/2021.  She already has a follow-up appointment for  abdominal pain issues with gastroenterology scheduled possibly for this week. - Admit to a MedSurg bed - Clear liquid diet and advance as tolerated - Orders in place to check C. difficile I panel if patient develops diarrhea given recent history of antibiotics - Changed antibiotics to Zosyn  given patient had been on Augmentin  already and has allergy to cephalosporin - Oxycodone /Dilaudid  IV as needed for moderate to severe pain respectively - Antiemetics as needed - Recommended patient keep outpatient follow-up with gastroenterology as may need an earlier colonoscopy given history of second episode of colitis and history of cervical cancer  Lactic  acidosis Acute.  Lactic acid initially elevated up to 5.3 with repeat check 3.9 after IV fluids.  Suspect likely related to above. - Continue to trend lactic acid levels - Continue IV fluids  Hypokalemia Acute.  Initial potassium noted to be 3.3.  Thought secondary to nausea and vomiting. - Give potassium chloride  20 meq p.o. 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 - Continue outpatient follow-up  DVT prophylaxis: Lovenox  Advance Care Planning:   Code Status: Full Code   Consults: None  Family Communication: None requested when asked  Severity of Illness: The appropriate patient status for this patient is INPATIENT. Inpatient status is judged to be reasonable and necessary in order to provide the required intensity of service to ensure the patient's safety. The patient's presenting symptoms, physical exam findings, and initial radiographic and laboratory data in the context of their chronic comorbidities is felt to place them at high risk for further clinical deterioration. Furthermore, it is not anticipated that the patient will be medically stable for discharge from the hospital within 2 midnights of admission.   * I certify that at the point of admission it is my clinical judgment that the patient will require inpatient hospital care spanning beyond 2 midnights from the point of admission due to high intensity of service, high risk for further deterioration and high frequency of surveillance required.*  Author: Maximino DELENA Sharps, MD 10/25/2023 9:26 AM  For on call review [email or web address].

## 2023-10-25 NOTE — ED Notes (Signed)
 Patient not wanting to have anymore labs drawn at this time.

## 2023-10-25 NOTE — Plan of Care (Signed)

## 2023-10-25 NOTE — ED Notes (Signed)
 Pt ambulated to restroom with minimal assistance.

## 2023-10-25 NOTE — ED Notes (Signed)
 The pt is moaning and groaning and is not holding still vitals almost impossible to get until she settles down

## 2023-10-25 NOTE — ED Notes (Signed)
 Not able to get accurate vitals signs at present no temp pulse ox not working

## 2023-10-26 DIAGNOSIS — K529 Noninfective gastroenteritis and colitis, unspecified: Secondary | ICD-10-CM | POA: Diagnosis not present

## 2023-10-26 LAB — CBC
HCT: 37.7 % (ref 36.0–46.0)
Hemoglobin: 12.6 g/dL (ref 12.0–15.0)
MCH: 30.9 pg (ref 26.0–34.0)
MCHC: 33.4 g/dL (ref 30.0–36.0)
MCV: 92.4 fL (ref 80.0–100.0)
Platelets: 325 K/uL (ref 150–400)
RBC: 4.08 MIL/uL (ref 3.87–5.11)
RDW: 12.6 % (ref 11.5–15.5)
WBC: 8.9 K/uL (ref 4.0–10.5)
nRBC: 0 % (ref 0.0–0.2)

## 2023-10-26 LAB — BASIC METABOLIC PANEL WITH GFR
Anion gap: 10 (ref 5–15)
BUN: 6 mg/dL (ref 6–20)
CO2: 22 mmol/L (ref 22–32)
Calcium: 8.8 mg/dL — ABNORMAL LOW (ref 8.9–10.3)
Chloride: 105 mmol/L (ref 98–111)
Creatinine, Ser: 1.1 mg/dL — ABNORMAL HIGH (ref 0.44–1.00)
GFR, Estimated: >60 mL/min (ref >60)
Glucose, Bld: 92 mg/dL (ref 70–99)
Potassium: 3.3 mmol/L — ABNORMAL LOW (ref 3.5–5.1)
Sodium: 137 mmol/L (ref 135–145)

## 2023-10-26 MED ORDER — SODIUM CHLORIDE 0.9% FLUSH
10.0000 mL | Freq: Two times a day (BID) | INTRAVENOUS | Status: DC
  Administered 2023-10-26: 10 mL

## 2023-10-26 MED ORDER — CHLORHEXIDINE GLUCONATE CLOTH 2 % EX PADS
6.0000 | MEDICATED_PAD | Freq: Every day | CUTANEOUS | Status: DC
  Administered 2023-10-26 – 2023-10-27 (×2): 6 via TOPICAL

## 2023-10-26 MED ORDER — POTASSIUM CHLORIDE CRYS ER 20 MEQ PO TBCR
40.0000 meq | EXTENDED_RELEASE_TABLET | Freq: Once | ORAL | Status: AC
Start: 2023-10-26 — End: 2023-10-26
  Administered 2023-10-26: 40 meq via ORAL
  Filled 2023-10-26: qty 2

## 2023-10-26 NOTE — Plan of Care (Signed)
   Problem: Elimination: Goal: Will not experience complications related to bowel motility Outcome: Progressing   Problem: Safety: Goal: Ability to remain free from injury will improve Outcome: Progressing

## 2023-10-26 NOTE — Progress Notes (Signed)
 Patient called out and stated that she is upset - she wants to know why she is still here.  And what are we doing.  Last bowel movement she had was 7/5 - she denies diarrhea or any other issues with her bowels.  She feels that we are not communicating with her.  MD notified

## 2023-10-26 NOTE — Progress Notes (Signed)
 Patient concerned regarding her CT results: 2. 3.8 cm thin walled peripherally enhancing left ovarian cyst. No follow-up imaging recommended. Note: This recommendation does not apply to premenarchal patients and to those with increased risk (genetic, family history, elevated tumor markers or other high-risk factors) of ovarian cancer. Reference: JACR 2020 Feb; 17(2):248-254 3. Focal fatty infiltration in the liver. 4. Small esophageal hiatal hernia.  Which she stated no one spoke to her regarding.  MD messaged stated he will speak with patient in the morning

## 2023-10-26 NOTE — Progress Notes (Signed)
 PROGRESS NOTE  Holly Brown  FMW:[id number] DOB: 03/11/1986 DOA: 10/25/2023 PCP: Corwin Antu, FNP   Brief Narrative: Patient is a 38 year old female with history of peripheral vascular disease, cervical cancer status post hysterectomy on November 20, 2022, colitis, anxiety, depression who presented with sudden onset of left-sided abdominal pain, nausea.  No report of diarrhea.  She was recently treated with antibiotics for colitis.  Recently took Augmentin  for sinus infection.  She was unable to tolerate food.  On presentation, she was hemodynamically stable.  Lab work showed elevated lactate, potassium of 3.3.  CTA abdomen/pelvis showed diffuse edematous colonic wall thickening likely representing ischemic infectious or inflammatory colitis.  C. difficile pending.  Assessment & Plan:  Principal Problem:   Colitis Active Problems:   Lactic acidosis   Hypokalemia   History of cervical cancer  Acute colitis: Presented with left-sided abdominal pain, nausea, vomiting.  CT abdomen/pelvis showed diffuse edematous chronic wall thickening likely representing infectious versus inflammatory colitis.  Currently on Zosyn .  She also follows with gastroenterology for chronic abdominal issues.  C. difficile was ordered but still pending.  This afternoon, she denies any nausea vomiting but complains of discomfort on the left lower quadrant.  Lactic acidosis: Elevated up to 5 on presentation.  Started on gentle IV fluids.Resolved  Hypokalemia: Supplemented with potassium  History of cervical cancer: Status post total abdominal hysterectomy on 8/24.  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  Continue outpatient follow-up with gyn oncology         DVT prophylaxis:enoxaparin  (LOVENOX ) injection 40 mg Start: 10/25/23 1000     Code Status: Full Code  Family Communication: None at bedside  Patient status:Inpatient  Patient is from  :home  Anticipated discharge un:ynfz  Estimated DC date:1-2 days   Consultants: None  Procedures:none  Antimicrobials:  Anti-infectives (From admission, onward)    Start     Dose/Rate Route Frequency Ordered Stop   10/25/23 1400  piperacillin -tazobactam (ZOSYN ) IVPB 3.375 g  Status:  Discontinued        3.375 g 100 mL/hr over 30 Minutes Intravenous Every 8 hours 10/25/23 0952 10/25/23 0952   10/25/23 1100  piperacillin -tazobactam (ZOSYN ) IVPB 3.375 g        3.375 g 12.5 mL/hr over 240 Minutes Intravenous Every 8 hours 10/25/23 0949     10/25/23 0545  metroNIDAZOLE  (FLAGYL ) IVPB 500 mg        500 mg 100 mL/hr over 60 Minutes Intravenous  Once 10/25/23 0536 10/25/23 9297       Subjective: Patient seen and examined at the bedside this afternoon.  She was eager to go home earlier but went into the room, she again had pain on the left side of her abdomen.  She was not nauseated or vomiting.  No diarrhea.  She does not feel ready to go home today.  We discussed about continue the antibiotics  Objective: Vitals:   10/25/23 2021 10/26/23 0030 10/26/23 0754 10/26/23 0900  BP: 99/60 (!) 101/56 (!) 89/37 (!) 120/51  Pulse: (!) 53 (!) 56 69 (!) 58  Resp: 18 17 17 17   Temp: 99.2 F (37.3 C) 98.9 F (37.2 C) 98 F (36.7 C)   TempSrc: Oral Oral Oral   SpO2: 98% 100% 100% 96%  Weight:      Height:        Intake/Output Summary (Last 24 hours) at 10/26/2023 1311 Last data filed at 10/26/2023 1200 Gross per 24 hour  Intake 1993.79 ml  Output --  Net 1993.79 ml   Filed Weights   10/25/23 0014  Weight: 102.3 kg    Examination:  General exam: Overall comfortable, not in distress HEENT: PERRL Respiratory system:  no wheezes or crackles  Cardiovascular system: S1 & S2 heard, RRR.  Gastrointestinal system: Abdomen is nondistended, soft and mostly nontender.  Bowel sounds present Central nervous system: Alert and oriented Extremities: No edema, no clubbing ,no cyanosis Skin: No  rashes, no ulcers,no icterus     Data Reviewed: I have personally reviewed following labs and imaging studies  CBC: Recent Labs  Lab 10/25/23 0031 10/25/23 0219 10/26/23 0125  WBC 9.2  --  8.9  HGB 14.7 13.6 12.6  HCT 42.6 40.0 37.7  MCV 91.6  --  92.4  PLT 389  --  325   Basic Metabolic Panel: Recent Labs  Lab 10/25/23 0031 10/25/23 0219 10/26/23 0125  NA 136 139 137  K 3.3* 3.5 3.3*  CL 102  --  105  CO2 15*  --  22  GLUCOSE 145*  --  92  BUN 8  --  6  CREATININE 0.98  --  1.10*  CALCIUM 9.6  --  8.8*     No results found for this or any previous visit (from the past 240 hours).   Radiology Studies: US  PELVIC COMPLETE W TRANSVAGINAL AND TORSION R/O Result Date: 10/25/2023 CLINICAL DATA:  Abdominal pain ovarian cyst on CT. EXAM: TRANSABDOMINAL AND TRANSVAGINAL ULTRASOUND OF PELVIS DOPPLER ULTRASOUND OF OVARIES TECHNIQUE: Both transabdominal and transvaginal ultrasound examinations of the pelvis were performed. Transabdominal technique was performed for global imaging of the pelvis including uterus, ovaries, adnexal regions, and pelvic cul-de-sac. It was necessary to proceed with endovaginal exam following the transabdominal exam to visualize the right ovary. Color and duplex Doppler ultrasound was utilized to evaluate blood flow to the ovaries. COMPARISON:  None Available. FINDINGS: Uterus Status post hysterectomy. Endometrium Not applicable Right ovary Measurements: 2.9 x 1.3 x 1.7 cm = volume: 3.4 mL. Normal appearance/no adnexal mass. Left ovary Measurements: 5.0 x 3.4 x 3.5 cm = volume: 31.1 mL. Complex cyst measures 3.5 x 2.6 x 2.6 cm. This has diffuse low-level internal echoes with a lace-like architecture. Pulsed Doppler evaluation of both ovaries demonstrates normal low-resistance arterial and venous waveforms. Other findings No abnormal free fluid. IMPRESSION: 1. No evidence for ovarian torsion. 2. Complex left ovarian cyst measures 3.5 x 2.6 x 2.6 cm. This has diffuse  low-level internal echoes with a lace-like architecture compatible with a benign hemorrhagic cyst. Consider short-interval follow up ultrasound in 6-12 weeks, preferably during the week following the patient's normal menses. 3. Status post hysterectomy. Electronically Signed   By: Waddell Calk M.D.   On: 10/25/2023 04:18   CT ABDOMEN PELVIS W CONTRAST Result Date: 10/25/2023 CLINICAL DATA:  Left lower quadrant abdominal pain. EXAM: CT ABDOMEN AND PELVIS WITH CONTRAST TECHNIQUE: Multidetector CT imaging of the abdomen and pelvis was performed using the standard protocol following bolus administration of intravenous contrast. RADIATION DOSE REDUCTION: This exam was performed according to the departmental dose-optimization program which includes automated exposure control, adjustment of the mA and/or kV according to patient size and/or use of iterative reconstruction technique. CONTRAST:  75mL OMNIPAQUE  IOHEXOL  350 MG/ML SOLN COMPARISON:  CT pelvis 12/29/2022.  CT abdomen and pelvis 12/28/2022 FINDINGS: Lower chest: Lung bases are clear.  Small esophageal hiatal hernia. Hepatobiliary: Focal area of low-attenuation in the medial segment left lobe of liver consistent with focal fatty infiltration.  Gallbladder and bile ducts are normal. Pancreas: Unremarkable. No pancreatic ductal dilatation or surrounding inflammatory changes. Spleen: Normal in size without focal abnormality. Adrenals/Urinary Tract: Adrenal glands are unremarkable. Kidneys are normal, without renal calculi, focal lesion, or hydronephrosis. Bladder is unremarkable. Stomach/Bowel: Stomach, small bowel, and colon are not abnormally distended. Under distention limits evaluation but the colonic wall appears to demonstrate diffuse wall thickening and edema. This is likely infectious or inflammatory colitis. Pseudomembranous colitis would be a secondary consideration in the appropriate clinical setting. No evidence of obstruction. Appendix is normal.  Vascular/Lymphatic: No significant vascular findings are present. No enlarged abdominal or pelvic lymph nodes. Reproductive: Uterus is surgically absent. 3.8 cm diameter left ovarian cyst with peripheral enhancement probably representing a physiologic cyst. Other: No free air or free fluid in the abdomen. Abdominal wall musculature appears intact. Musculoskeletal: No acute or significant osseous findings. IMPRESSION: 1. Diffuse edematous colonic wall thickening likely representing infectious or inflammatory colitis. Pseudomembranous colitis could be another consideration in the appropriate clinical setting. 2. 3.8 cm thin walled peripherally enhancing left ovarian cyst. No follow-up imaging recommended. Note: This recommendation does not apply to premenarchal patients and to those with increased risk (genetic, family history, elevated tumor markers or other high-risk factors) of ovarian cancer. Reference: JACR 2020 Feb; 17(2):248-254 3. Focal fatty infiltration in the liver. 4. Small esophageal hiatal hernia. Electronically Signed   By: Elsie Gravely M.D.   On: 10/25/2023 02:48    Scheduled Meds:  Chlorhexidine  Gluconate Cloth  6 each Topical Daily   enoxaparin  (LOVENOX ) injection  40 mg Subcutaneous Q24H   sodium chloride  flush  10-40 mL Intracatheter Q12H   sodium chloride  flush  3 mL Intravenous Q12H   Continuous Infusions:  lactated ringers  100 mL/hr at 10/26/23 1142   piperacillin -tazobactam (ZOSYN )  IV 3.375 g (10/26/23 1147)     LOS: 1 day   Ivonne Mustache, MD [facility] P7/10/2023, 1:11 PM

## 2023-10-26 NOTE — Plan of Care (Signed)

## 2023-10-26 NOTE — Progress Notes (Signed)

## 2023-10-27 ENCOUNTER — Other Ambulatory Visit (HOSPITAL_COMMUNITY): Payer: Self-pay

## 2023-10-27 ENCOUNTER — Ambulatory Visit (HOSPITAL_COMMUNITY)

## 2023-10-27 DIAGNOSIS — K529 Noninfective gastroenteritis and colitis, unspecified: Secondary | ICD-10-CM | POA: Diagnosis not present

## 2023-10-27 LAB — BASIC METABOLIC PANEL WITH GFR
Anion gap: 8 (ref 5–15)
BUN: 6 mg/dL (ref 6–20)
CO2: 24 mmol/L (ref 22–32)
Calcium: 8.4 mg/dL — ABNORMAL LOW (ref 8.9–10.3)
Chloride: 106 mmol/L (ref 98–111)
Creatinine, Ser: 1.06 mg/dL — ABNORMAL HIGH (ref 0.44–1.00)
GFR, Estimated: >60 mL/min (ref >60)
Glucose, Bld: 97 mg/dL (ref 70–99)
Potassium: 3.4 mmol/L — ABNORMAL LOW (ref 3.5–5.1)
Sodium: 138 mmol/L (ref 135–145)

## 2023-10-27 MED ORDER — AMOXICILLIN-POT CLAVULANATE 875-125 MG PO TABS
1.0000 | ORAL_TABLET | Freq: Two times a day (BID) | ORAL | Status: DC
  Filled 2023-10-27: qty 1

## 2023-10-27 MED ORDER — POTASSIUM CHLORIDE CRYS ER 20 MEQ PO TBCR
40.0000 meq | EXTENDED_RELEASE_TABLET | Freq: Every day | ORAL | 0 refills | Status: DC
  Filled 2023-10-27: qty 6, 3d supply, fill #0

## 2023-10-27 MED ORDER — ONDANSETRON HCL 4 MG PO TABS
4.0000 mg | ORAL_TABLET | Freq: Four times a day (QID) | ORAL | 0 refills | Status: DC | PRN
  Filled 2023-10-27: qty 20, 5d supply, fill #0

## 2023-10-27 MED ORDER — POTASSIUM CHLORIDE CRYS ER 20 MEQ PO TBCR
40.0000 meq | EXTENDED_RELEASE_TABLET | Freq: Once | ORAL | Status: AC
  Administered 2023-10-27: 40 meq via ORAL
  Filled 2023-10-27: qty 2

## 2023-10-27 MED ORDER — AMOXICILLIN-POT CLAVULANATE 875-125 MG PO TABS
1.0000 | ORAL_TABLET | Freq: Two times a day (BID) | ORAL | 0 refills | Status: DC
  Filled 2023-10-27: qty 14, 7d supply, fill #0

## 2023-10-27 MED ORDER — ACETAMINOPHEN 500 MG PO TABS
500.0000 mg | ORAL_TABLET | Freq: Four times a day (QID) | ORAL | 0 refills | Status: DC | PRN
  Filled 2023-10-27: qty 100, 25d supply, fill #0

## 2023-10-27 MED ORDER — AMOXICILLIN-POT CLAVULANATE 600-42.9 MG/5ML PO SUSR
875.0000 mg | Freq: Two times a day (BID) | ORAL | 0 refills | Status: AC
  Filled 2023-10-27: qty 125, 7d supply, fill #0

## 2023-10-27 NOTE — Progress Notes (Signed)
 Reviewed AVS, patient expressed understanding of medications, MD follow up reviewed.   Patient informed and expressed understanding of where to pick up discharge medications. Patient has requested suspension form of antibiotic; provider is working on updating the orders for discharge medications. Informed RN; RN will update AVS and print once orders have been completed.

## 2023-10-27 NOTE — Discharge Summary (Addendum)
 Physician Discharge Summary  Holly Brown FMW:[id number] DOB: May 08, 1985 DOA: 10/25/2023  PCP: Corwin Antu, FNP  Admit date: 10/25/2023 Discharge date: 10/27/2023  Admitted From: Home Disposition:  Home  Discharge Condition:Stable CODE STATUS:FULL Diet recommendation: Soft diet for next 2-3 days  Brief/Interim Summary: Patient is a 38 year old female with history of peripheral vascular disease, cervical cancer status post hysterectomy on November 20, 2022, colitis, anxiety, depression who presented with sudden onset of left-sided abdominal pain, nausea. No report of diarrhea. She was recently treated with antibiotics for colitis. Recently took Augmentin  for sinus infection. She was unable to tolerate food. On presentation, she was hemodynamically stable. Lab work showed elevated lactate, potassium of 3.3. CTA abdomen/pelvis showed diffuse edematous colonic wall thickening likely representing ischemic infectious or inflammatory colitis.  C. difficile was ordered but she never had bowel movement after admission.  Currently her abdominal pain has significantly improved.  Abdomen is soft and nontender this morning.  No nausea or vomiting and she is tolerating solid food.  She is medically stable for discharge home today with oral antibiotics.  Following problems were addressed during the hospitalization:  Acute colitis: Presented with left-sided abdominal pain, nausea, vomiting.  CT abdomen/pelvis showed diffuse edematous chronic wall thickening likely representing infectious versus inflammatory colitis.  Started  on Zosyn .  She also follows with gastroenterology for chronic abdominal issues.  C. difficile was ordered but she did not have any bowel movement after admission..  This morning, she denies any nausea or vomiting .  No abdominal pain.  Abdomen is soft and nontender, has good bowel sounds. Antibiotic changed to oral.  She will continue on Augmentin  for a week   Lactic acidosis: Elevated up to  5 on presentation.  Started on gentle IV fluids.Resolved   Hypokalemia: Supplemented with potassium.  Continue supplementation for 3 more days.   History of cervical cancer: Status post total abdominal hysterectomy on 8/24.  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  Currently in remission.  CT scan also showed left ovarian cyst ,no signs of torsion.  Ultrasound follow-up recommended in 6 to 12 months.  Continue outpatient follow-up with gyn oncology     Discharge Diagnoses:  Principal Problem:   Colitis Active Problems:   Lactic acidosis   Hypokalemia   History of cervical cancer    Discharge Instructions  Discharge Instructions     Diet general   Complete by: As directed    Soft diet for next 2-3 days   Discharge instructions   Complete by: As directed    1)Please take prescribed medications as instructed 2)Follow up with your PCP in a week.   Increase activity slowly   Complete by: As directed       Allergies as of 10/27/2023       Reactions   Keflex  [cephalexin ]    Itching         Medication List     TAKE these medications    acetaminophen  500 MG tablet Commonly known as: TYLENOL  Take 1 tablet (500 mg total) by mouth every 6 (six) hours as needed for up to 20 days.   amoxicillin -clavulanate 600-42.9 MG/5ML suspension Commonly known as: AUGMENTIN  Take 5 mLs (600 mg total) by mouth 2 (two) times daily for 7 days. What changed: how much to take   dicyclomine  20 MG tablet Commonly known as: BENTYL  Take 1 tablet (20 mg total) by mouth 3 (three) times daily before meals.   famotidine  20 MG tablet Commonly  known as: PEPCID  Take 1 tablet (20 mg total) by mouth 2 (two) times daily. What changed:  when to take this reasons to take this   HAIR SKIN AND NAILS FORMULA PO Take 1 tablet by mouth daily.   ondansetron  4 MG tablet Commonly known as: Zofran  Take 1 tablet (4 mg total) by mouth  every 8 (eight) hours as needed for nausea or vomiting. What changed: Another medication with the same name was added. Make sure you understand how and when to take each.   ondansetron  4 MG tablet Commonly known as: ZOFRAN  Take 1 tablet (4 mg total) by mouth every 6 (six) hours as needed for nausea. What changed: You were already taking a medication with the same name, and this prescription was added. Make sure you understand how and when to take each.   potassium chloride  SA 20 MEQ tablet Commonly known as: KLOR-CON  M Take 2 tablets (40 mEq total) by mouth daily for 3 days. Start taking on: October 28, 2023        Follow-up Information     Corwin Antu, FNP. Schedule an appointment as soon as possible for a visit in 1 week(s).   Specialty: Family Medicine Contact information: [address] [place] [address] [phone]                Allergies  Allergen Reactions   Keflex  [Cephalexin ]     Itching     Consultations: None   Procedures/Studies: US  PELVIC COMPLETE W TRANSVAGINAL AND TORSION R/O Result Date: 10/25/2023 CLINICAL DATA:  Abdominal pain ovarian cyst on CT. EXAM: TRANSABDOMINAL AND TRANSVAGINAL ULTRASOUND OF PELVIS DOPPLER ULTRASOUND OF OVARIES TECHNIQUE: Both transabdominal and transvaginal ultrasound examinations of the pelvis were performed. Transabdominal technique was performed for global imaging of the pelvis including uterus, ovaries, adnexal regions, and pelvic cul-de-sac. It was necessary to proceed with endovaginal exam following the transabdominal exam to visualize the right ovary. Color and duplex Doppler ultrasound was utilized to evaluate blood flow to the ovaries. COMPARISON:  None Available. FINDINGS: Uterus Status post hysterectomy. Endometrium Not applicable Right ovary Measurements: 2.9 x 1.3 x 1.7 cm = volume: 3.4 mL. Normal appearance/no adnexal mass. Left ovary Measurements: 5.0 x 3.4 x 3.5 cm = volume: 31.1 mL. Complex cyst  measures 3.5 x 2.6 x 2.6 cm. This has diffuse low-level internal echoes with a lace-like architecture. Pulsed Doppler evaluation of both ovaries demonstrates normal low-resistance arterial and venous waveforms. Other findings No abnormal free fluid. IMPRESSION: 1. No evidence for ovarian torsion. 2. Complex left ovarian cyst measures 3.5 x 2.6 x 2.6 cm. This has diffuse low-level internal echoes with a lace-like architecture compatible with a benign hemorrhagic cyst. Consider short-interval follow up ultrasound in 6-12 weeks, preferably during the week following the patient's normal menses. 3. Status post hysterectomy. Electronically Signed   By: Waddell Calk M.D.   On: 10/25/2023 04:18   CT ABDOMEN PELVIS W CONTRAST Result Date: 10/25/2023 CLINICAL DATA:  Left lower quadrant abdominal pain. EXAM: CT ABDOMEN AND PELVIS WITH CONTRAST TECHNIQUE: Multidetector CT imaging of the abdomen and pelvis was performed using the standard protocol following bolus administration of intravenous contrast. RADIATION DOSE REDUCTION: This exam was performed according to the departmental dose-optimization program which includes automated exposure control, adjustment of the mA and/or kV according to patient size and/or use of iterative reconstruction technique. CONTRAST:  75mL OMNIPAQUE  IOHEXOL  350 MG/ML SOLN COMPARISON:  CT pelvis 12/29/2022.  CT abdomen and pelvis 12/28/2022 FINDINGS: Lower  chest: Lung bases are clear.  Small esophageal hiatal hernia. Hepatobiliary: Focal area of low-attenuation in the medial segment left lobe of liver consistent with focal fatty infiltration. Gallbladder and bile ducts are normal. Pancreas: Unremarkable. No pancreatic ductal dilatation or surrounding inflammatory changes. Spleen: Normal in size without focal abnormality. Adrenals/Urinary Tract: Adrenal glands are unremarkable. Kidneys are normal, without renal calculi, focal lesion, or hydronephrosis. Bladder is unremarkable. Stomach/Bowel:  Stomach, small bowel, and colon are not abnormally distended. Under distention limits evaluation but the colonic wall appears to demonstrate diffuse wall thickening and edema. This is likely infectious or inflammatory colitis. Pseudomembranous colitis would be a secondary consideration in the appropriate clinical setting. No evidence of obstruction. Appendix is normal. Vascular/Lymphatic: No significant vascular findings are present. No enlarged abdominal or pelvic lymph nodes. Reproductive: Uterus is surgically absent. 3.8 cm diameter left ovarian cyst with peripheral enhancement probably representing a physiologic cyst. Other: No free air or free fluid in the abdomen. Abdominal wall musculature appears intact. Musculoskeletal: No acute or significant osseous findings. IMPRESSION: 1. Diffuse edematous colonic wall thickening likely representing infectious or inflammatory colitis. Pseudomembranous colitis could be another consideration in the appropriate clinical setting. 2. 3.8 cm thin walled peripherally enhancing left ovarian cyst. No follow-up imaging recommended. Note: This recommendation does not apply to premenarchal patients and to those with increased risk (genetic, family history, elevated tumor markers or other high-risk factors) of ovarian cancer. Reference: JACR 2020 Feb; 17(2):248-254 3. Focal fatty infiltration in the liver. 4. Small esophageal hiatal hernia. Electronically Signed   By: Elsie Gravely M.D.   On: 10/25/2023 02:48      Subjective: Patient seen and examined at bedside today.  Hemodynamically stable.  Denies any abdominal pain or nausea or vomiting this morning.  She feels ready to go home.  We discussed about having soft diet for next 2 to 3 days.  I recommend to follow-up with her PCP in a week  Discharge Exam: Vitals:   10/27/23 0539 10/27/23 0839  BP: 117/66 106/60  Pulse: 73 (!) 49  Resp: 18 15  Temp: 98.2 F (36.8 C) 98.2 F (36.8 C)  SpO2: 99% 96%   Vitals:    10/26/23 1607 10/26/23 1948 10/27/23 0539 10/27/23 0839  BP: (!) 110/55 118/74 117/66 106/60  Pulse: 63 (!) 54 73 (!) 49  Resp: 17 17 18 15   Temp:  98.4 F (36.9 C) 98.2 F (36.8 C) 98.2 F (36.8 C)  TempSrc:  Oral Oral Oral  SpO2: 97% 100% 99% 96%  Weight:      Height:        General: Pt is alert, awake, not in acute distress Cardiovascular: RRR, S1/S2 +, no rubs, no gallops Respiratory: CTA bilaterally, no wheezing, no rhonchi Abdominal: Soft, NT, ND, bowel sounds + Extremities: no edema, no cyanosis    The results of significant diagnostics from this hospitalization (including imaging, microbiology, ancillary and laboratory) are listed below for reference.     Microbiology: No results found for this or any previous visit (from the past 240 hours).   Labs: BNP (last 3 results) No results for input(s): BNP in the last 8760 hours. Basic Metabolic Panel: Recent Labs  Lab 10/25/23 0031 10/25/23 0219 10/26/23 0125 10/27/23 0300  NA 136 139 137 138  K 3.3* 3.5 3.3* 3.4*  CL 102  --  105 106  CO2 15*  --  22 24  GLUCOSE 145*  --  92 97  BUN 8  --  6 6  CREATININE 0.98  --  1.10* 1.06*  CALCIUM 9.6  --  8.8* 8.4*   Liver Function Tests: Recent Labs  Lab 10/25/23 0031  AST 20  ALT 18  ALKPHOS 55  BILITOT 0.9  PROT 7.8  ALBUMIN 4.2   Recent Labs  Lab 10/25/23 0031  LIPASE 30   No results for input(s): AMMONIA in the last 168 hours. CBC: Recent Labs  Lab 10/25/23 0031 10/25/23 0219 10/26/23 0125  WBC 9.2  --  8.9  HGB 14.7 13.6 12.6  HCT 42.6 40.0 37.7  MCV 91.6  --  92.4  PLT 389  --  325   Cardiac Enzymes: No results for input(s): CKTOTAL, CKMB, CKMBINDEX, TROPONINI in the last 168 hours. BNP: Invalid input(s): POCBNP CBG: No results for input(s): GLUCAP in the last 168 hours. D-Dimer No results for input(s): DDIMER in the last 72 hours. Hgb A1c No results for input(s): HGBA1C in the last 72 hours. Lipid Profile No  results for input(s): CHOL, HDL, LDLCALC, TRIG, CHOLHDL, LDLDIRECT in the last 72 hours. Thyroid function studies No results for input(s): TSH, T4TOTAL, T3FREE, THYROIDAB in the last 72 hours.  Invalid input(s): FREET3 Anemia work up No results for input(s): VITAMINB12, FOLATE, FERRITIN, TIBC, IRON, RETICCTPCT in the last 72 hours. Urinalysis    Component Value Date/Time   COLORURINE YELLOW 10/25/2023 0256   APPEARANCEUR CLEAR 10/25/2023 0256   LABSPEC 1.024 10/25/2023 0256   PHURINE 6.0 10/25/2023 0256   GLUCOSEU >=500 (A) 10/25/2023 0256   HGBUR SMALL (A) 10/25/2023 0256   BILIRUBINUR NEGATIVE 10/25/2023 0256   BILIRUBINUR Negative 09/04/2023 0955   KETONESUR 5 (A) 10/25/2023 0256   PROTEINUR NEGATIVE 10/25/2023 0256   UROBILINOGEN 0.2 09/04/2023 0955   UROBILINOGEN 0.2 01/04/2018 1129   NITRITE NEGATIVE 10/25/2023 0256   LEUKOCYTESUR NEGATIVE 10/25/2023 0256   Sepsis Labs Recent Labs  Lab 10/25/23 0031 10/26/23 0125  WBC 9.2 8.9   Microbiology No results found for this or any previous visit (from the past 240 hours).  Please note: You were cared for by a hospitalist during your hospital stay. Once you are discharged, your primary care physician will handle any further medical issues. Please note that NO REFILLS for any discharge medications will be authorized once you are discharged, as it is imperative that you return to your primary care physician (or establish a relationship with a primary care physician if you do not have one) for your post hospital discharge needs so that they can reassess your need for medications and monitor your lab values.    Time coordinating discharge: 40 minutes  SIGNED:   Ivonne Mustache, MD  [facility] 10/27/2023, 1:01 PM Pager [phone]  If 7PM-7AM, please contact night-coverage [email or web address] Password [email or web address]

## 2023-10-28 ENCOUNTER — Telehealth: Payer: Self-pay

## 2023-10-28 ENCOUNTER — Other Ambulatory Visit (HOSPITAL_COMMUNITY): Payer: Self-pay

## 2023-10-28 NOTE — Transitions of Care (Post Inpatient/ED Visit) (Signed)
   10/28/2023  Name: Holly Brown MRN: [id number] DOB: [birth year]  Today's TOC FU Call Status: Today's TOC FU Call Status:: Successful TOC FU Call Completed TOC FU Call Complete Date: 10/28/23 Patient's Name and Date of Birth confirmed.  Transition Care Management Follow-up Telephone Call Date of Discharge: 10/27/23 Discharge Facility: [facility]) Type of Discharge: Inpatient Admission Primary Inpatient Discharge Diagnosis:: gastroenteritis How have you been since you were released from the hospital?: Better Any questions or concerns?: No  Items Reviewed: Did you receive and understand the discharge instructions provided?: Yes Medications obtained,verified, and reconciled?: Yes (Medications Reviewed) Any new allergies since your discharge?: No Dietary orders reviewed?: Yes Do you have support at home?: Yes People in Home [RPT]: parent(s)  Medications Reviewed Today: Medications Reviewed Today     Reviewed by Emmitt Pan, LPN (Licensed Practical Nurse) on 10/28/23 at 1022  Med List Status: <None>   Medication Order Taking? Sig Documenting Provider Last Dose Status Informant  acetaminophen  (TYLENOL ) 500 MG tablet [id number] Yes Take 1 tablet (500 mg total) by mouth every 6 (six) hours as needed for up to 20 days. Jillian Buttery, MD  Active   amoxicillin -clavulanate (AUGMENTIN ) 600-42.9 MG/5ML suspension [id number] Yes Take 7.3 mLs (875 mg total) by mouth 2 (two) times daily for 7 days. Discard remaining. Jillian Buttery, MD  Active   dicyclomine  (BENTYL ) 20 MG tablet [id number]  Take 1 tablet (20 mg total) by mouth 3 (three) times daily before meals.  Patient not taking: Reported on 10/28/2023   [place], PA-C  Active Self, Pharmacy Records  famotidine  (PEPCID ) 20 MG tablet [id number] Yes Take 1 tablet (20 mg total) by mouth 2 (two) times daily.  Patient taking differently: Take 20 mg by mouth daily as needed for heartburn.   Tower, Laine LABOR, MD  Active Self, Pharmacy  Records  Multiple Vitamins-Minerals [facility] SKIN AND NAILS FORMULA PO) [id number] Yes Take 1 tablet by mouth daily. [provider]  Active Self, Pharmacy Records  ondansetron  (ZOFRAN ) 4 MG tablet [id number] Yes Take 1 tablet (4 mg total) by mouth every 8 (eight) hours as needed for nausea or vomiting. [place], PA-C  Active Self, Pharmacy Records  ondansetron  (ZOFRAN ) 4 MG tablet [id number] Yes Take 1 tablet (4 mg total) by mouth every 6 (six) hours as needed for nausea. Jillian Buttery, MD  Active   potassium chloride  SA (KLOR-CON  M) 20 MEQ tablet [id number] Yes Take 2 tablets (40 mEq total) by mouth daily for 3 days. Jillian Buttery, MD  Active             Home Care and Equipment/Supplies: Were Home Health Services Ordered?: NA Any new equipment or medical supplies ordered?: NA  Functional Questionnaire: Do you need assistance with bathing/showering or dressing?: No Do you need assistance with meal preparation?: No Do you need assistance with eating?: No Do you have difficulty maintaining continence: No Do you need assistance with getting out of bed/getting out of a chair/moving?: No Do you have difficulty managing or taking your medications?: No  Follow up appointments reviewed: PCP Follow-up appointment confirmed?: Yes Date of PCP follow-up appointment?: 10/30/23 Follow-up Provider: [facility] Follow-up appointment confirmed?: Yes Date of Specialist follow-up appointment?: 11/05/23 Follow-Up Specialty Provider:: GI Do you need transportation to your follow-up appointment?: No Do you understand care options if your condition(s) worsen?: Yes-patient verbalized understanding    SIGNATURE Pan Emmitt, LPN [facility] Nurse Health Advisor Direct Dial [phone]

## 2023-10-30 ENCOUNTER — Ambulatory Visit (INDEPENDENT_AMBULATORY_CARE_PROVIDER_SITE_OTHER): Admitting: Family

## 2023-10-30 ENCOUNTER — Telehealth: Payer: Self-pay | Admitting: Family

## 2023-10-30 ENCOUNTER — Telehealth: Payer: Self-pay

## 2023-10-30 ENCOUNTER — Encounter: Payer: Self-pay | Admitting: Family Medicine

## 2023-10-30 ENCOUNTER — Encounter: Payer: Self-pay | Admitting: Family

## 2023-10-30 ENCOUNTER — Ambulatory Visit (INDEPENDENT_AMBULATORY_CARE_PROVIDER_SITE_OTHER): Admitting: Family Medicine

## 2023-10-30 VITALS — BP 112/76 | Temp 98.4°F | Wt 227.7 lb

## 2023-10-30 VITALS — BP 112/84 | HR 84 | Temp 98.2°F | Ht 70.0 in | Wt 220.0 lb

## 2023-10-30 DIAGNOSIS — E876 Hypokalemia: Secondary | ICD-10-CM | POA: Diagnosis not present

## 2023-10-30 DIAGNOSIS — R1030 Lower abdominal pain, unspecified: Secondary | ICD-10-CM | POA: Diagnosis not present

## 2023-10-30 DIAGNOSIS — K529 Noninfective gastroenteritis and colitis, unspecified: Secondary | ICD-10-CM

## 2023-10-30 NOTE — Progress Notes (Unsigned)
 Established Patient Office Visit  Subjective   Patient ID: Holly Brown, female    DOB: [birth year]  Age: 38 y.o. MRN: [id number]  Chief Complaint  Patient presents with   Abdominal Pain    HPI  {History (Optional):23778} Mrs Paxson is seen for basically hospital follow-up regarding recent abdominal pain.  She was actually at her primary provider's office earlier today but they were running much later than usual and she ended up rescheduling here.  She has had past history of colitis in the past in 2023.  This past Saturday she developed rather acute left-sided abdominal pain.  She described this as sharp.  She had couple episodes of vomiting.  Was admitted July 6th through the eighth.  Her past history significant for cervix cancer with history hysterectomy 2024.  She had CT scan which showed diffuse edematous chronic wall thickening left side of colon likely representing infectious versus inflammatory colitis.  Was initially started on Zosyn .  Discharged on Augmentin  which she is still completing.  She did have low potassium 3.3 is taking oral potassium replacement.  Abdominal pain has improved since starting on antibiotics.  Relatively normal stools.  No recent diarrhea.  Recent imaging did also show a left ovarian cyst and she had pelvic ultrasound and this looked to be more of a benign hemorrhagic type cyst.  She has follow-up scheduled with her GYN.  Past Medical History:  Diagnosis Date   Anxiety    Depression    Gestational diabetes    metformin  - resolved after delivery   History of UTI    HSV (herpes simplex virus) infection    Migraines    Peripheral vascular disease (HCC)    Post-operative nausea and vomiting 12/19/2022   Seizures (HCC)    Jan.2016 - only had one seizure, none since   Sleep apnea    Vaginal Pap smear, abnormal    Past Surgical History:  Procedure Laterality Date   ABDOMINAL HYSTERECTOMY  12/16/2022   BIOPSY  12/24/2022   Procedure: BIOPSY;   Surgeon: Leigh Elspeth SQUIBB, MD;  Location: [facility];  Service: Gastroenterology;;   DILATION AND CURETTAGE OF UTERUS     ESOPHAGOGASTRODUODENOSCOPY (EGD) WITH PROPOFOL  N/A 12/24/2022   Procedure: ESOPHAGOGASTRODUODENOSCOPY (EGD) WITH PROPOFOL ;  Surgeon: Leigh Elspeth SQUIBB, MD;  Location: [facility];  Service: Gastroenterology;  Laterality: N/A;   LAPAROSCOPIC TUBAL LIGATION Bilateral 12/08/2018   Procedure: LAPAROSCOPIC TUBAL LIGATION;  Surgeon: Alger Gong, MD;  Location: [facility];  Service: Gynecology;  Laterality: Bilateral;   LEEP N/A 03/18/2022   Procedure: LOOP ELECTROSURGICAL EXCISION PROCEDURE (LEEP);  Surgeon: Alger Gong, MD;  Location: [facility] OR;  Service: Gynecology;  Laterality: N/A;   TUBAL LIGATION     VAGINAL HYSTERECTOMY N/A 12/16/2022   Procedure: TOTAL VAGINAL HYSTERECTOMY, BILATERAL SALPINGECTOMY;  Surgeon: Lorence Ozell CROME, MD;  Location: [facility] OR;  Service: Gynecology;  Laterality: N/A;   WISDOM TOOTH EXTRACTION  04/21/2009    reports that she has quit smoking. Her smoking use included cigarettes. She started smoking about 11 years ago. She has a 3 pack-year smoking history. She has never been exposed to tobacco smoke. She has never used smokeless tobacco. She reports current alcohol use. She reports that she does not currently use drugs after having used the following drugs: Marijuana. family history includes Cancer in her father; Colon cancer (age of onset: 6) in her father; Diabetes in her maternal grandmother and mother; Hypertension in her maternal grandmother and paternal grandmother; Stroke in  her mother. Allergies  Allergen Reactions   Keflex  [Cephalexin ]     Itching     Review of Systems  Constitutional:  Negative for chills and fever.  Cardiovascular:  Negative for chest pain.  Gastrointestinal:  Positive for abdominal pain. Negative for heartburn and nausea.  Genitourinary:  Negative for dysuria.      Objective:     BP  112/76 (BP Location: Left Arm, Patient Position: Sitting, Cuff Size: Large)   Temp 98.4 F (36.9 C) (Oral)   Wt 227 lb 11.2 oz (103.3 kg)   LMP  (LMP Unknown) Comment: Depo Injections - last injection 10/2022, had spotting on and off since 10/2022 injection  BMI 32.67 kg/m  BP Readings from Last 3 Encounters:  10/30/23 112/76  10/30/23 112/84  10/27/23 106/60   Wt Readings from Last 3 Encounters:  10/30/23 227 lb 11.2 oz (103.3 kg)  10/30/23 220 lb (99.8 kg)  10/25/23 225 lb 8.5 oz (102.3 kg)      Physical Exam Vitals reviewed.  Constitutional:      General: She is not in acute distress.    Appearance: She is not ill-appearing.  Abdominal:     Comments: Normal bowel sounds.  Nondistended.  Soft with very minimal tenderness mid to lower quadrant to deep palpation.  No guarding or rebound.  No masses palpated.  Neurological:     Mental Status: She is alert.      No results found for any visits on 10/30/23.  Last CBC Lab Results  Component Value Date   WBC 8.9 10/26/2023   HGB 12.6 10/26/2023   HCT 37.7 10/26/2023   MCV 92.4 10/26/2023   MCH 30.9 10/26/2023   RDW 12.6 10/26/2023   PLT 325 10/26/2023   Last metabolic panel Lab Results  Component Value Date   GLUCOSE 97 10/27/2023   NA 138 10/27/2023   K 3.4 (L) 10/27/2023   CL 106 10/27/2023   CO2 24 10/27/2023   BUN 6 10/27/2023   CREATININE 1.06 (H) 10/27/2023   GFRNONAA >60 10/27/2023   CALCIUM 8.4 (L) 10/27/2023   PROT 7.8 10/25/2023   ALBUMIN 4.2 10/25/2023   LABGLOB 3.1 10/10/2015   AGRATIO 1.4 10/10/2015   BILITOT 0.9 10/25/2023   ALKPHOS 55 10/25/2023   AST 20 10/25/2023   ALT 18 10/25/2023   ANIONGAP 8 10/27/2023      The ASCVD Risk score (Arnett DK, et al., 2019) failed to calculate for the following reasons:   The 2019 ASCVD risk score is only valid for ages 13 to 62    Assessment & Plan:   #1 recent admission for nonspecific colitis left colon.  She has had previous episode back in 2024  which was treated with antibiotics.  Clinically seems to be improving on Augmentin .  White count was normal on her recent admission.  She has pending follow-up Monday with her gastroenterologist.  #2 recent mild hypokalemia.  No chronic diuretic use.  Patient currently on supplement.  #3 ovarian cyst noted on recent imaging with CAT scan and pelvic ultrasound.  She has pending follow-up with her GYN oncologist  Wolm Scarlet, MD

## 2023-10-30 NOTE — Telephone Encounter (Signed)
 Patient returning call. Slates she would need to keep her original time. Cannot do 3:30

## 2023-10-30 NOTE — Patient Instructions (Signed)
 Keep follow up with GI  Fleurette out current antibiotics  Follow up for any fever or worsening abdominal pain.

## 2023-10-30 NOTE — Telephone Encounter (Signed)
 Mychart message

## 2023-10-30 NOTE — Progress Notes (Signed)
 Not seen, erroneous encounter.  Pt left the office due to wait time.

## 2023-10-30 NOTE — Telephone Encounter (Signed)
 For documentation purposes Pt came out into the hallway as she had been waiting for about 30 minutes to be seen and stated she was going to reschedule because she 'can not wait this long'. She was advised that I was handling a medical emergency and she stated 'well I might be a medical emergency and might need to go to the ER as well'. I advised pt that if she wanted to reschedule she was more than welcome to but that she was my next pt I was going into, she ended up leaving due to wait time saying that 'I don't have time for this'

## 2023-11-01 ENCOUNTER — Encounter (HOSPITAL_COMMUNITY): Payer: Self-pay | Admitting: Obstetrics & Gynecology

## 2023-11-01 ENCOUNTER — Ambulatory Visit (HOSPITAL_COMMUNITY)
Admission: RE | Admit: 2023-11-01 | Discharge: 2023-11-01 | Disposition: A | Discharge status: Home / Self Care | Source: Ambulatory Visit | Attending: Obstetrics & Gynecology | Admitting: Obstetrics & Gynecology

## 2023-11-01 DIAGNOSIS — Z8541 Personal history of malignant neoplasm of cervix uteri: Secondary | ICD-10-CM | POA: Diagnosis not present

## 2023-11-01 DIAGNOSIS — C539 Malignant neoplasm of cervix uteri, unspecified: Secondary | ICD-10-CM | POA: Insufficient documentation

## 2023-11-01 DIAGNOSIS — N83202 Unspecified ovarian cyst, left side: Secondary | ICD-10-CM | POA: Diagnosis not present

## 2023-11-01 DIAGNOSIS — Z90710 Acquired absence of both cervix and uterus: Secondary | ICD-10-CM | POA: Diagnosis not present

## 2023-11-01 DIAGNOSIS — N3289 Other specified disorders of bladder: Secondary | ICD-10-CM | POA: Diagnosis not present

## 2023-11-01 MED ORDER — GADOBUTROL 1 MMOL/ML IV SOLN
10.0000 mL | Freq: Once | INTRAVENOUS | Status: AC | PRN
  Administered 2023-11-01: 10 mL via INTRAVENOUS

## 2023-11-01 NOTE — Progress Notes (Unsigned)
 Ellouise Console, PA-C [address] [place], [place]  [address] Phone: [phone]   Primary Care Physician: Corwin Antu, FNP  Primary Gastroenterologist:  Ellouise Console, PA-C / Dr. Gordy Starch   Chief Complaint:  Hospital F/U Colitis       HPI:   Holly Brown is a 38 y.o. female returns for 75-month follow-up of lower abdominal cramping and diarrhea.  2 months ago I ordered stool studies (fecal calprotectin, GI path, C. difficile), which were not completed.  Started patient on dicyclomine  20 Mg 3 times daily.  Continue famotidine  20 Mg daily and Zofran  as needed.  She has family history of her father who had colon cancer.  Is due for repeat colonoscopy with 2-day prep.  She was admitted to hospital 10/25/2023 until 10/27/2023 for acute colitis.  Presented with left side abdominal pain, nausea, vomiting.  CT abdomen pelvis showed diffuse  edematous chronic wall thickening likely representing infectious versus inflammatory colitis.  Started  on Zosyn .   C. difficile was ordered but she did not have any bowel movement after admission.  Discharged home on Augmentin  x 7 days.  She had mild hypokalemia treated with potassium.  Last labs 10/27/2023 showed potassium 3.4.  Normal CBC with WBC 8.9, Hgb 12.6.  Current symptoms:  PMH: Hx Cervical Cancer s/p hysterectomy 12/16/2022. Also had radiation.  Previous history of duodenal ulcer 12/2022 ( H. Pylori Negative).   Peripheral vascular disease, colitis, anxiety, depression.   12/2022 EGD in hospital: 1 cm hiatal hernia, nonbleeding superficial duodenal ulcer 4 to 5 mm.  Normal stomach and esophagus.  Ulcer treated with Protonix  40 Mg twice daily and told to avoid NSAIDs.  Biopsies negative for H. pylori or intestinal metaplasia.   04/2016 Last colonoscopy by Dr. Starch: Incomplete due to very poor prep (with Suprep).  Patient could not tolerate Suprep due to vomiting the prep.  Unsatisfactory bowel prep.   Repeat colonoscopy was recommended with  2-day prep, and patient has not completed.   Patient's father had colon cancer.  Patient has difficulty swallowing large pills and declines a pill prep.  She has been able to tolerate MiraLAX  in the past.  Current Outpatient Medications  Medication Sig Dispense Refill   amoxicillin -clavulanate (AUGMENTIN ) 600-42.9 MG/5ML suspension Take 7.3 mLs (875 mg total) by mouth 2 (two) times daily for 7 days. Discard remaining. 125 mL 0   Multiple Vitamins-Minerals (HAIR SKIN AND NAILS FORMULA PO) Take 1 tablet by mouth daily.     potassium chloride  SA (KLOR-CON  M) 20 MEQ tablet Take 2 tablets (40 mEq total) by mouth daily for 3 days. 6 tablet 0   No current facility-administered medications for this visit.    Allergies as of 11/02/2023 - Review Complete 10/30/2023  Allergen Reaction Noted   Keflex  [cephalexin ]  09/08/2023    Past Medical History:  Diagnosis Date   Anxiety    Depression    Gestational diabetes    metformin  - resolved after delivery   History of UTI    HSV (herpes simplex virus) infection    Migraines    Peripheral vascular disease (HCC)    Post-operative nausea and vomiting 12/19/2022   Seizures (HCC)    Jan.2016 - only had one seizure, none since   Sleep apnea    Vaginal Pap smear, abnormal     Past Surgical History:  Procedure Laterality Date   ABDOMINAL HYSTERECTOMY  12/16/2022   BIOPSY  12/24/2022   Procedure: BIOPSY;  Surgeon:  Armbruster, Elspeth SQUIBB, MD;  Location: [facility];  Service: Gastroenterology;;   DILATION AND CURETTAGE OF UTERUS     ESOPHAGOGASTRODUODENOSCOPY (EGD) WITH PROPOFOL  N/A 12/24/2022   Procedure: ESOPHAGOGASTRODUODENOSCOPY (EGD) WITH PROPOFOL ;  Surgeon: Leigh Elspeth SQUIBB, MD;  Location[facility] END[facility]COPY;  Service: Gastroenterology;  Laterality: N/A;   LAPAROSCOPIC TUBAL LIGATION Bilateral 12/08/2018   Procedure: LAPAROSCOPIC TUBAL LIGATION;  Surgeon: Alger Gong, MD;  Location: [facility];  Service: Gynecology;  Laterality:  Bilateral;   LEEP N/A 03/18/2022   Procedure: LOOP ELECTROSURGICAL EXCISION PROCEDURE (LEEP);  Surgeon: Alger Gong, MD;  Location: [facility];  Service: Gynecology;  Laterality: N/A;   TUBAL LIGATION     VAGINAL HYSTERECTOMY N/A 12/16/2022   Procedure: TOTAL VAGINAL HYSTERECTOMY, BILATERAL SALPINGECTOMY;  Surgeon: Lorence Ozell CROME, MD;  Location: [facility];  Service: Gynecology;  Laterality: N/A;   WISDOM TOOTH EXTRACTION  04/21/2009    Review of Systems:    All systems reviewed and negative except where noted in HPI.    Physical Exam:  LMP  (LMP Unknown) Comment: Depo Injections - last injection 10/2022, had spotting on and off since 10/2022 injection No LMP recorded (lmp unknown). Patient has had a hysterectomy.  General: Well-nourished, well-developed in no acute distress.  Lungs: Clear to auscultation bilaterally. Non-labored. Heart: Regular rate and rhythm, no murmurs rubs or gallops.  Abdomen: Bowel sounds are normal; Abdomen is Soft; No hepatosplenomegaly, masses or hernias;  No Abdominal Tenderness; No guarding or rebound tenderness. Neuro: Alert and oriented x 3.  Grossly intact.  Psych: Alert and cooperative, normal mood and affect.   Imaging Studies: US  PELVIC COMPLETE W TRANSVAGINAL AND TORSION R/O Result Date: 10/25/2023 CLINICAL DATA:  Abdominal pain ovarian cyst on CT. EXAM: TRANSABDOMINAL AND TRANSVAGINAL ULTRASOUND OF PELVIS DOPPLER ULTRASOUND OF OVARIES TECHNIQUE: Both transabdominal and transvaginal ultrasound examinations of the pelvis were performed. Transabdominal technique was performed for global imaging of the pelvis including uterus, ovaries, adnexal regions, and pelvic cul-de-sac. It was necessary to proceed with endovaginal exam following the transabdominal exam to visualize the right ovary. Color and duplex Doppler ultrasound was utilized to evaluate blood flow to the ovaries. COMPARISON:  None Available. FINDINGS: Uterus Status post hysterectomy. Endometrium Not  applicable Right ovary Measurements: 2.9 x 1.3 x 1.7 cm = volume: 3.4 mL. Normal appearance/no adnexal mass. Left ovary Measurements: 5.0 x 3.4 x 3.5 cm = volume: 31.1 mL. Complex cyst measures 3.5 x 2.6 x 2.6 cm. This has diffuse low-level internal echoes with a lace-like architecture. Pulsed Doppler evaluation of both ovaries demonstrates normal low-resistance arterial and venous waveforms. Other findings No abnormal free fluid. IMPRESSION: 1. No evidence for ovarian torsion. 2. Complex left ovarian cyst measures 3.5 x 2.6 x 2.6 cm. This has diffuse low-level internal echoes with a lace-like architecture compatible with a benign hemorrhagic cyst. Consider short-interval follow up ultrasound in 6-12 weeks, preferably during the week following the patient's normal menses. 3. Status post hysterectomy. Electronically Signed   By: Waddell Calk M.D.   On: 10/25/2023 04:18   CT ABDOMEN PELVIS W CONTRAST Result Date: 10/25/2023 CLINICAL DATA:  Left lower quadrant abdominal pain. EXAM: CT ABDOMEN AND PELVIS WITH CONTRAST TECHNIQUE: Multidetector CT imaging of the abdomen and pelvis was performed using the standard protocol following bolus administration of intravenous contrast. RADIATION DOSE REDUCTION: This exam was performed according to the departmental dose-optimization program which includes automated exposure control, adjustment of the mA and/or kV according to patient size and/or use of iterative reconstruction technique.  CONTRAST:  75mL OMNIPAQUE  IOHEXOL  350 MG/ML SOLN COMPARISON:  CT pelvis 12/29/2022.  CT abdomen and pelvis 12/28/2022 FINDINGS: Lower chest: Lung bases are clear.  Small esophageal hiatal hernia. Hepatobiliary: Focal area of low-attenuation in the medial segment left lobe of liver consistent with focal fatty infiltration. Gallbladder and bile ducts are normal. Pancreas: Unremarkable. No pancreatic ductal dilatation or surrounding inflammatory changes. Spleen: Normal in size without focal  abnormality. Adrenals/Urinary Tract: Adrenal glands are unremarkable. Kidneys are normal, without renal calculi, focal lesion, or hydronephrosis. Bladder is unremarkable. Stomach/Bowel: Stomach, small bowel, and colon are not abnormally distended. Under distention limits evaluation but the colonic wall appears to demonstrate diffuse wall thickening and edema. This is likely infectious or inflammatory colitis. Pseudomembranous colitis would be a secondary consideration in the appropriate clinical setting. No evidence of obstruction. Appendix is normal. Vascular/Lymphatic: No significant vascular findings are present. No enlarged abdominal or pelvic lymph nodes. Reproductive: Uterus is surgically absent. 3.8 cm diameter left ovarian cyst with peripheral enhancement probably representing a physiologic cyst. Other: No free air or free fluid in the abdomen. Abdominal wall musculature appears intact. Musculoskeletal: No acute or significant osseous findings. IMPRESSION: 1. Diffuse edematous colonic wall thickening likely representing infectious or inflammatory colitis. Pseudomembranous colitis could be another consideration in the appropriate clinical setting. 2. 3.8 cm thin walled peripherally enhancing left ovarian cyst. No follow-up imaging recommended. Note: This recommendation does not apply to premenarchal patients and to those with increased risk (genetic, family history, elevated tumor markers or other high-risk factors) of ovarian cancer. Reference: JACR 2020 Feb; 17(2):248-254 3. Focal fatty infiltration in the liver. 4. Small esophageal hiatal hernia. Electronically Signed   By: Elsie Gravely M.D.   On: 10/25/2023 02:48    Labs: CBC    Component Value Date/Time   WBC 8.9 10/26/2023 0125   RBC 4.08 10/26/2023 0125   HGB 12.6 10/26/2023 0125   HGB 12.5 10/12/2018 0908   HCT 37.7 10/26/2023 0125   HCT 38.0 10/12/2018 0908   PLT 325 10/26/2023 0125   PLT 372 10/12/2018 0908   MCV 92.4 10/26/2023  0125   MCV 85 10/12/2018 0908   MCH 30.9 10/26/2023 0125   MCHC 33.4 10/26/2023 0125   RDW 12.6 10/26/2023 0125   RDW 14.0 10/12/2018 0908   LYMPHSABS 2.1 09/04/2023 0932   LYMPHSABS 2.3 03/29/2018 1105   MONOABS 0.5 09/04/2023 0932   EOSABS 0.3 09/04/2023 0932   EOSABS 0.1 03/29/2018 1105   BASOSABS 0.1 09/04/2023 0932   BASOSABS 0.0 03/29/2018 1105    CMP     Component Value Date/Time   NA 138 10/27/2023 0300   NA 139 10/10/2015 1623   K 3.4 (L) 10/27/2023 0300   CL 106 10/27/2023 0300   CO2 24 10/27/2023 0300   GLUCOSE 97 10/27/2023 0300   BUN 6 10/27/2023 0300   BUN 7 10/10/2015 1623   CREATININE 1.06 (H) 10/27/2023 0300   CREATININE 0.88 07/24/2023 1554   CALCIUM 8.4 (L) 10/27/2023 0300   PROT 7.8 10/25/2023 0031   PROT 7.4 10/10/2015 1623   ALBUMIN 4.2 10/25/2023 0031   ALBUMIN 4.3 10/10/2015 1623   AST 20 10/25/2023 0031   ALT 18 10/25/2023 0031   ALKPHOS 55 10/25/2023 0031   BILITOT 0.9 10/25/2023 0031   BILITOT 0.7 10/10/2015 1623   GFRNONAA >60 10/27/2023 0300   GFRAA >60 12/10/2018 1300       Assessment and Plan:   Holly Brown is a 38 y.o. y/o female  returns for follow-up of lower abdominal cramping and diarrhea.  Acute colitis: Admitted to hospital 7/6 until 7/8 for acute versus infectious colitis.  Treated with Zosyn .  Discharged on Augmentin  x 7 days.  Unable to do stool studies. -If still having diarrhea, then do stool studies - Schedule follow-up colonoscopy  2.  Hypokalemia -Eat potassium foods such as baked potato, banana, tomatoes, orange juice, Gatorade. -Repeat BMP 1 week prior to colonoscopy.   3.  History of duodenal ulcer (seen on EGD 12/2022; biopsy negative for H. pylori). - She is instructed to avoid NSAIDs - Continue famotidine  20 Mg daily. - If she has recurrent upper abdominal pain, then restart PPI.   4.  Family history of father with colon cancer - If stool studies are negative for infectious pathogens, then we will  schedule repeat colonoscopy with 2-day prep.  Patient states she vomited Suprep previously.  She has difficulty swallowing pills.  She thinks she would be able to tolerate MiraLAX  and Gatorade prep.       Ellouise Console, PA-C  Follow up ***

## 2023-11-02 ENCOUNTER — Other Ambulatory Visit (INDEPENDENT_AMBULATORY_CARE_PROVIDER_SITE_OTHER)

## 2023-11-02 ENCOUNTER — Other Ambulatory Visit (HOSPITAL_COMMUNITY): Payer: Self-pay

## 2023-11-02 ENCOUNTER — Encounter: Payer: Self-pay | Admitting: Physician Assistant

## 2023-11-02 ENCOUNTER — Ambulatory Visit (INDEPENDENT_AMBULATORY_CARE_PROVIDER_SITE_OTHER): Admitting: Physician Assistant

## 2023-11-02 VITALS — BP 124/78 | HR 72 | Ht 70.0 in | Wt 221.0 lb

## 2023-11-02 DIAGNOSIS — K5904 Chronic idiopathic constipation: Secondary | ICD-10-CM

## 2023-11-02 DIAGNOSIS — Z800 Family history of malignant neoplasm of digestive organs: Secondary | ICD-10-CM

## 2023-11-02 DIAGNOSIS — E876 Hypokalemia: Secondary | ICD-10-CM | POA: Diagnosis not present

## 2023-11-02 DIAGNOSIS — K529 Noninfective gastroenteritis and colitis, unspecified: Secondary | ICD-10-CM

## 2023-11-02 DIAGNOSIS — K5909 Other constipation: Secondary | ICD-10-CM

## 2023-11-02 DIAGNOSIS — K219 Gastro-esophageal reflux disease without esophagitis: Secondary | ICD-10-CM

## 2023-11-02 LAB — BASIC METABOLIC PANEL WITH GFR
BUN: 10 mg/dL (ref 6–23)
CO2: 28 meq/L (ref 19–32)
Calcium: 9.6 mg/dL (ref 8.4–10.5)
Chloride: 103 meq/L (ref 96–112)
Creatinine, Ser: 0.92 mg/dL (ref 0.40–1.20)
GFR: 80.48 mL/min (ref >60.00)
Glucose, Bld: 93 mg/dL (ref 70–99)
Potassium: 3.5 meq/L (ref 3.5–5.1)
Sodium: 139 meq/L (ref 135–145)

## 2023-11-02 MED ORDER — POLYETHYLENE GLYCOL 3350 17 G PO PACK: 17.0000 g | PACK | Freq: Every day | ORAL | Status: DC

## 2023-11-02 MED ORDER — OMEPRAZOLE 40 MG PO CPDR
40.0000 mg | DELAYED_RELEASE_CAPSULE | Freq: Every day | ORAL | 3 refills | Status: DC
  Filled 2023-11-02: qty 90, 90d supply, fill #0

## 2023-11-02 NOTE — Patient Instructions (Addendum)
 Your provider has requested that you go to the basement level for lab work before leaving today. Press B on the elevator. The lab is located at the first door on the left as you exit the elevator.    For constipation: Start OTC Miralax  Powder Mix 1 capful in 6 to 8 ounces of a drink once daily  Recommend high-fiber diet, 30 g of fiber daily Eat fruits, vegetables, and whole grains Drink 64 ounces of water / fluids daily.   For Acid Reflux / Heartburn: - Start Rx Omeprazole  40mg  1 tablet once daily.  Please follow up sooner if symptoms increase or worsen  Due to recent changes in healthcare laws, you may see the results of your imaging and laboratory studies on MyChart before your provider has had a chance to review them.  We understand that in some cases there may be results that are confusing or concerning to you. Not all laboratory results come back in the same time frame and the provider may be waiting for multiple results in order to interpret others.  Please give us  48 hours in order for your provider to thoroughly review all the results before contacting the office for clarification of your results.   Thank you for trusting me with your gastrointestinal care!   Ellouise Console, PA-C _______________________________________________________  If your blood pressure at your visit was 140/90 or greater, please contact your primary care physician to follow up on this.  _______________________________________________________  If you are age 59 or older, your body mass index should be between 23-30. Your Body mass index is 31.71 kg/m. If this is out of the aforementioned range listed, please consider follow up with your Primary Care Provider.  If you are age 74 or younger, your body mass index should be between 19-25. Your Body mass index is 31.71 kg/m. If this is out of the aformentioned range listed, please consider follow up with your Primary Care Provider.    ________________________________________________________  The [facility] providers would like to encourage you to use MYCHART to communicate with providers for non-urgent requests or questions.  Due to long hold times on the telephone, sending your provider a message by [facility] may be a faster and more efficient way to get a response.  Please allow 48 business hours for a response.  Please remember that this is for non-urgent requests.  _______________________________________________________

## 2023-11-02 NOTE — Telephone Encounter (Signed)
 Called pt as requested. LVM. Gave direct line to my office.

## 2023-11-03 ENCOUNTER — Other Ambulatory Visit (HOSPITAL_COMMUNITY): Payer: Self-pay

## 2023-11-03 ENCOUNTER — Ambulatory Visit: Payer: Self-pay | Admitting: Physician Assistant

## 2023-11-04 ENCOUNTER — Telehealth: Payer: Self-pay

## 2023-11-04 NOTE — Telephone Encounter (Signed)
 Patient called to speak with Dr Rogelio and go over MRI results.. forwarding message to Dr Rogelio.

## 2023-11-05 ENCOUNTER — Ambulatory Visit: Admitting: Obstetrics and Gynecology

## 2023-11-05 VITALS — BP 139/89 | HR 65 | Wt 225.0 lb

## 2023-11-05 DIAGNOSIS — Z8541 Personal history of malignant neoplasm of cervix uteri: Secondary | ICD-10-CM | POA: Diagnosis not present

## 2023-11-05 DIAGNOSIS — N83202 Unspecified ovarian cyst, left side: Secondary | ICD-10-CM | POA: Diagnosis not present

## 2023-11-05 NOTE — Progress Notes (Signed)
 Pt is in office to discuss MRI and ovarian cyst - pt states this is the second occurrence since her hysterectomy. Pt does have some discomfort on lower right pelvis.

## 2023-11-05 NOTE — Progress Notes (Signed)
   ESTABLISHED GYNECOLOGY VISIT Chief Complaint  Patient presents with   Ovarian Cyst    Subjective:  Holly Brown is a 38 y.o. [id number] presenting for ovarian cyst follow up.  Patient was recently admitted to the hospital for colitis and had a CT A/P that showed an ovarian cyst. She had a pelvic ultrasound that showed the same. She had an MRI as well for other reasons as ordered by her gyn oncologist.  Patient with history of cervical cancer. Most recent gyn oncology note from 7/2. History of cervical cancer also reviewed. Op note from 11/2022 and pathology consistent with cervical cancer reviewed.  Vaginal cuff biopsy 10/01/2023 FINAL MICROSCOPIC DIAGNOSIS:   A. VAGINAL CUFF, BIOPSY:  - Fragment of squamous mucosa with focal small area of atypical squamous  epithelium at the biopsy edge, see comment.  - Deeper sections were examined.   Imaging reviewed Pelvic ultrasound: IMPRESSION: 1. No evidence for ovarian torsion. 2. Complex left ovarian cyst measures 3.5 x 2.6 x 2.6 cm. This has diffuse low-level internal echoes with a lace-like architecture compatible with a benign hemorrhagic cyst. Consider short-interval follow up ultrasound in 6-12 weeks, preferably during the week following the patient's normal menses. 3. Status post hysterectomy.  MRI Pelvis: IMPRESSION: 1. Nodular focus of enhancing slightly T2 hyperintense soft tissue along the left side of the vaginal cuff which restricts diffusion measuring 16 x 13 mm, nonspecific but suspicious for local recurrence. Suggest further evaluation with direct visualization and tissue sampling. 2. Intrinsically T1 hyperintense left ovarian lesion measuring 2.4 cm demonstrates internal layering complexity adherent to the dependent wall favored a hemorrhagic cyst with adherent clot. Suggest follow-up pelvic ultrasound in 6-12 weeks to ensure resolution. 3. Mild persistent wall thickening of visualized portions of  colon, suggestive of colitis. 4. Symmetric wall thickening of a nondistended urinary bladder, which may be due to underdistention or cystitis.  Review of Systems:   Pertinent items are noted in HPI  Pertinent History Reviewed:  Reviewed past medical,surgical, social and family history.  Reviewed problem list, medications and allergies.  Objective:   Vitals:   11/05/23 1557  BP: 139/89  Pulse: 65  Weight: 225 lb (102.1 kg)   Physical Examination:   General appearance - well appearing, and in no distress  Mental status - alert, oriented to person, place, and time  Psych:  normal mood and affect  Skin - warm and dry, normal color, no suspicious lesions noted  Abdomen - soft, nontender, nondistended, no masses or organomegaly  Pelvic - deferred  Extremities:  No swelling or varicosities noted     Assessment and Plan:  1. Ovarian cyst, left (Primary) Findings suggestive of hemorrhagic cyst. Recommend repeat pelvic ultrasound in approximately 6 weeks. Order placed. Patient verbalized understanding. - US  PELVIC COMPLETE WITH TRANSVAGINAL; Future  2. History of cervical cancer F/u with gyn oncology as planned  No follow-ups on file.  Future Appointments  Date Time Provider Department Center  12/14/2023  8:00 AM Russell Josette LABOR, PT [facility] None  01/04/2024 12:30 PM Pyrtle, Gordy HERO, MD [facility]  01/21/2024  2:00 PM [name] Comer SAUNDERS, MD [facility] None  04/22/2024  1:00 PM [name] Comer SAUNDERS, MD [facility] None    Rollo ONEIDA Bring, MD, FACOG Obstetrician & Gynecologist, [facility] for [facility], [facility]

## 2023-11-06 ENCOUNTER — Other Ambulatory Visit: Payer: Self-pay | Admitting: Gynecologic Oncology

## 2023-11-06 DIAGNOSIS — R935 Abnormal findings on diagnostic imaging of other abdominal regions, including retroperitoneum: Secondary | ICD-10-CM

## 2023-11-06 DIAGNOSIS — R1900 Intra-abdominal and pelvic swelling, mass and lump, unspecified site: Secondary | ICD-10-CM

## 2023-11-09 ENCOUNTER — Telehealth: Payer: Self-pay | Admitting: *Deleted

## 2023-11-09 NOTE — Telephone Encounter (Signed)
 Attempted to reach patient. Left voicemail requesting call back to relay Pet Scan has been scheduled for Jul 30 th at 4 pm at [facility]. Nothing to eat after 10 am, and only water until scheduled scan time. Arrive by 3:30 pm for check in.

## 2023-11-09 NOTE — Telephone Encounter (Signed)
 Attempted to reach patient. Left voicemail requesting call back to [phone].

## 2023-11-10 ENCOUNTER — Telehealth: Payer: Self-pay | Admitting: Oncology

## 2023-11-10 NOTE — Telephone Encounter (Signed)
 [facility] radiology regarding the MRI pelvis from 11/01/2023.  Asked if the radiologist felt that the findings could represent scar tissue or residual hematoma and if a PET scan would be appropriate.  They will put in an addendum for the radiologist to review.

## 2023-11-12 ENCOUNTER — Other Ambulatory Visit (HOSPITAL_COMMUNITY): Payer: Self-pay

## 2023-11-16 ENCOUNTER — Ambulatory Visit (HOSPITAL_COMMUNITY)
Admission: RE | Admit: 2023-11-16 | Discharge: 2023-11-16 | Disposition: A | Discharge status: Home / Self Care | Source: Ambulatory Visit | Attending: Gynecologic Oncology | Admitting: Gynecologic Oncology

## 2023-11-16 DIAGNOSIS — N839 Noninflammatory disorder of ovary, fallopian tube and broad ligament, unspecified: Secondary | ICD-10-CM | POA: Insufficient documentation

## 2023-11-16 DIAGNOSIS — C539 Malignant neoplasm of cervix uteri, unspecified: Secondary | ICD-10-CM | POA: Diagnosis not present

## 2023-11-16 DIAGNOSIS — R1900 Intra-abdominal and pelvic swelling, mass and lump, unspecified site: Secondary | ICD-10-CM | POA: Diagnosis not present

## 2023-11-16 DIAGNOSIS — R930 Abnormal findings on diagnostic imaging of skull and head, not elsewhere classified: Secondary | ICD-10-CM | POA: Insufficient documentation

## 2023-11-16 DIAGNOSIS — Z8541 Personal history of malignant neoplasm of cervix uteri: Secondary | ICD-10-CM | POA: Diagnosis not present

## 2023-11-16 DIAGNOSIS — R935 Abnormal findings on diagnostic imaging of other abdominal regions, including retroperitoneum: Secondary | ICD-10-CM | POA: Diagnosis not present

## 2023-11-16 LAB — GLUCOSE, CAPILLARY: Glucose-Capillary: 92 mg/dL (ref 70–99)

## 2023-11-16 MED ORDER — FLUDEOXYGLUCOSE F - 18 (FDG) INJECTION
11.7000 | Freq: Once | INTRAVENOUS | Status: AC | PRN
  Administered 2023-11-16: 11.7 via INTRAVENOUS

## 2023-11-18 ENCOUNTER — Other Ambulatory Visit (HOSPITAL_COMMUNITY)

## 2023-11-18 ENCOUNTER — Telehealth: Payer: Self-pay | Admitting: *Deleted

## 2023-11-18 NOTE — Telephone Encounter (Signed)
 Spoke with [name] and relayed message from [name], NP that patient's PET scan results:  1. No findings to suggest metastatic disease on PET. 2. The area at the vaginal cuff did not light up on PET. 3. They note uptake on the left ovary which could be from ovulation. Dr. Rogelio is not concerned about this and does not recommend follow up for this.  4. They note some mild uptake with her left sinus. Pt reports having some sinus congestion currently.   Overall, recommend continuing surveillance visits. Pt verbalized understanding and reminded of her follow up appointment with Dr. [name] on Thursday, October 7th at 2pm.

## 2023-11-18 NOTE — Telephone Encounter (Signed)
-----   Message from Eleanor JONETTA Epps sent at 11/18/2023 11:10 AM EDT ----- Please call her with PET scan results:  1) No findings to suggest metastatic disease on PET.  2) The area at the vaginal cuff did not light up on PET.  3) They note uptake on the left ovary which could be from ovulation. Dr. JERELD is not concerned about this and does not recommend follow up for this.   4) They note some mild uptake with her left sinus. Any issues with her sinuses currently??  Overall, recommend continuing with surveillance visits.

## 2023-11-19 ENCOUNTER — Other Ambulatory Visit: Payer: Self-pay

## 2023-11-19 ENCOUNTER — Encounter (HOSPITAL_COMMUNITY): Payer: Self-pay

## 2023-11-19 ENCOUNTER — Emergency Department (HOSPITAL_COMMUNITY)

## 2023-11-19 ENCOUNTER — Emergency Department (HOSPITAL_COMMUNITY)
Admission: EM | Admit: 2023-11-19 | Discharge: 2023-11-19 | Disposition: A | Discharge status: Home / Self Care | Attending: Emergency Medicine | Admitting: Emergency Medicine

## 2023-11-19 DIAGNOSIS — R079 Chest pain, unspecified: Secondary | ICD-10-CM | POA: Diagnosis not present

## 2023-11-19 DIAGNOSIS — R0682 Tachypnea, not elsewhere classified: Secondary | ICD-10-CM | POA: Diagnosis not present

## 2023-11-19 DIAGNOSIS — R0789 Other chest pain: Secondary | ICD-10-CM | POA: Insufficient documentation

## 2023-11-19 LAB — COMPREHENSIVE METABOLIC PANEL WITH GFR
ALT: 12 U/L (ref 0–44)
AST: 17 U/L (ref 15–41)
Albumin: 3.7 g/dL (ref 3.5–5.0)
Alkaline Phosphatase: 65 U/L (ref 38–126)
Anion gap: 12 (ref 5–15)
BUN: 8 mg/dL (ref 6–20)
CO2: 21 mmol/L — ABNORMAL LOW (ref 22–32)
Calcium: 9.4 mg/dL (ref 8.9–10.3)
Chloride: 103 mmol/L (ref 98–111)
Creatinine, Ser: 0.95 mg/dL (ref 0.44–1.00)
GFR, Estimated: >60 mL/min (ref >60)
Glucose, Bld: 103 mg/dL — ABNORMAL HIGH (ref 70–99)
Potassium: 3.7 mmol/L (ref 3.5–5.1)
Sodium: 136 mmol/L (ref 135–145)
Total Bilirubin: 1.3 mg/dL — ABNORMAL HIGH (ref 0.0–1.2)
Total Protein: 7.6 g/dL (ref 6.5–8.1)

## 2023-11-19 LAB — CBC
HCT: 40.6 % (ref 36.0–46.0)
Hemoglobin: 13.4 g/dL (ref 12.0–15.0)
MCH: 30.7 pg (ref 26.0–34.0)
MCHC: 33 g/dL (ref 30.0–36.0)
MCV: 93.1 fL (ref 80.0–100.0)
Platelets: 392 K/uL (ref 150–400)
RBC: 4.36 MIL/uL (ref 3.87–5.11)
RDW: 12.8 % (ref 11.5–15.5)
WBC: 8 K/uL (ref 4.0–10.5)
nRBC: 0 % (ref 0.0–0.2)

## 2023-11-19 LAB — TROPONIN I (HIGH SENSITIVITY)
Troponin I (High Sensitivity): <2 ng/L (ref <18)
Troponin I (High Sensitivity): <2 ng/L (ref <18)

## 2023-11-19 LAB — LIPASE, BLOOD: Lipase: 26 U/L (ref 11–51)

## 2023-11-19 LAB — D-DIMER, QUANTITATIVE: D-Dimer, Quant: <0.27 ug{FEU}/mL (ref 0.00–0.50)

## 2023-11-19 LAB — BRAIN NATRIURETIC PEPTIDE: B Natriuretic Peptide: 20.9 pg/mL (ref 0.0–100.0)

## 2023-11-19 LAB — HCG, SERUM, QUALITATIVE: Preg, Serum: NEGATIVE

## 2023-11-19 MED ORDER — FENTANYL CITRATE PF 50 MCG/ML IJ SOSY
25.0000 ug | PREFILLED_SYRINGE | Freq: Once | INTRAMUSCULAR | Status: AC
  Administered 2023-11-19: 25 ug via INTRAVENOUS
  Filled 2023-11-19: qty 1

## 2023-11-19 MED ORDER — KETOROLAC TROMETHAMINE 15 MG/ML IJ SOLN
15.0000 mg | Freq: Once | INTRAMUSCULAR | Status: AC
  Administered 2023-11-19: 15 mg via INTRAVENOUS
  Filled 2023-11-19: qty 1

## 2023-11-19 MED ORDER — SODIUM CHLORIDE 0.9 % IV BOLUS
500.0000 mL | Freq: Once | INTRAVENOUS | Status: AC
  Administered 2023-11-19: 500 mL via INTRAVENOUS

## 2023-11-19 NOTE — ED Triage Notes (Signed)
 Pt reports with sharp shooting chest pain that goes into her back since this morning.

## 2023-11-19 NOTE — ED Provider Notes (Signed)
 [place] EMERGENCY DEPARTMENT AT [facility] Provider Note   CSN: [id number] Arrival date & time: 11/19/23  9160     Patient presents with: Chest Pain   Holly Brown is a 38 y.o. female.   HPI Patient presents with chest pain.  Patient's history is notable for malignancy now in remission, recent admission for colitis.  She was well, recovering over the past 2 weeks until today when she developed chest pressure, with mild dyspnea.  No obvious precipitant. Symptoms are persistent, 8/10 out of pain.  No history of cardiac disease, pulmonary disease,    Prior to Admission medications   Medication Sig Start Date End Date Taking? Authorizing Provider  omeprazole  (PRILOSEC) 40 MG capsule Take 1 capsule (40 mg total) by mouth daily. 11/02/23   [place], PA-C  polyethylene glycol (MIRALAX  / GLYCOLAX ) 17 g packet Take 17 g by mouth daily. 11/02/23   [place], PA-C    Allergies: Keflex  [cephalexin ]    Review of Systems  Updated Vital Signs BP 127/77   Pulse (!) 58   Temp (!) 97.4 F (36.3 C)   Resp 17   Ht 5' 10 (1.778 m)   Wt 106.6 kg   LMP  (LMP Unknown) Comment: Depo Injections - last injection 10/2022, had spotting on and off since 10/2022 injection  SpO2 96%   BMI 33.72 kg/m   Physical Exam Vitals and nursing note reviewed.  Constitutional:      General: She is not in acute distress.    Appearance: She is well-developed.  HENT:     Head: Normocephalic and atraumatic.  Eyes:     Conjunctiva/sclera: Conjunctivae normal.  Cardiovascular:     Rate and Rhythm: Normal rate and regular rhythm.  Pulmonary:     Effort: Pulmonary effort is normal. Tachypnea present. No respiratory distress.     Breath sounds: Normal breath sounds. No stridor.  Abdominal:     General: There is no distension.  Skin:    General: Skin is warm and dry.  Neurological:     Mental Status: She is alert and oriented to person, place, and time.     Cranial Nerves: No  cranial nerve deficit.  Psychiatric:        Mood and Affect: Mood normal.     (all labs ordered are listed, but only abnormal results are displayed) Labs Reviewed  COMPREHENSIVE METABOLIC PANEL WITH GFR - Abnormal; Notable for the following components:      Result Value   CO2 21 (*)    Glucose, Bld 103 (*)    Total Bilirubin 1.3 (*)    All other components within normal limits  CBC  BRAIN NATRIURETIC PEPTIDE  D-DIMER, QUANTITATIVE  LIPASE, BLOOD  HCG, SERUM, QUALITATIVE  TROPONIN I (HIGH SENSITIVITY)  TROPONIN I (HIGH SENSITIVITY)    EKG: None  Radiology: DG Chest 2 View Result Date: 11/19/2023 CLINICAL DATA:  Chest pain. EXAM: CHEST - 2 VIEW COMPARISON:  01/08/2023. FINDINGS: The heart size and mediastinal contours are within normal limits. Both lungs are clear. No pleural effusion or pneumothorax. The visualized skeletal structures are unremarkable. IMPRESSION: No acute cardiopulmonary findings. Electronically Signed   By: Harrietta Sherry M.D.   On: 11/19/2023 09:49     Procedures   Medications Ordered in the ED  fentaNYL  (SUBLIMAZE ) injection 25 mcg (25 mcg Intravenous Given 11/19/23 0912)  sodium chloride  0.9 % bolus 500 mL (0 mLs Intravenous Stopped 11/19/23 1018)  ketorolac  (TORADOL ) 15 MG/ML injection 15  mg (15 mg Intravenous Given 11/19/23 1051)                                    Medical Decision Making Adult female with recent cancer history, recent admission for colitis now presents with chest pain, pressure. Broad differential including pneumonia given hospitalization, pulmonary embolism given cancer history and immobilization with hospitalization, less likely ACS given youth, absence of risk factors. Cardiac 95 sinus normal pulse ox 100% room air normal she has borderline tachypneic as well.  Amount and/or Complexity of Data Reviewed External Data Reviewed: notes.    Details: Discharge summary reviewed Labs: ordered. Decision-making details documented in  ED Course. Radiology: ordered and independent interpretation performed. Decision-making details documented in ED Course. ECG/medicine tests: ordered and independent interpretation performed. Decision-making details documented in ED Course.  Risk Prescription drug management. Decision regarding hospitalization. Diagnosis or treatment significantly limited by social determinants of health.   12:02 PM Patient in no distress, awake, alert, vitals unremarkable, troponin x 2 normal and with no risk profile for ACS low suspicion for this.  Dimer negative, she is not hypoxic or tachypneic, no evidence for PE.  No evidence for pneumonia, patient's pain is improved, patient will follow-up with primary care.     Final diagnoses:  Atypical chest pain    ED Discharge Orders     None          Garrick Charleston, MD 11/19/23 1202

## 2023-11-19 NOTE — Discharge Instructions (Signed)
 As discussed, your evaluation today has been largely reassuring.  But, it is important that you monitor your condition carefully, and do not hesitate to return to the ED if you develop new, or concerning changes in your condition. ? ?Otherwise, please follow-up with your physician for appropriate ongoing care. ? ?

## 2023-11-20 ENCOUNTER — Encounter: Payer: Self-pay | Admitting: Family

## 2023-11-20 NOTE — Telephone Encounter (Signed)
 Patient had requested to change her PCP to Dr. Randeen. Dr. Randeen declined. Due to the exchange that occurred while pt was here on 7/11, pt is discharged from the practice. I explained to pt she would receive a letter with the same information. Pt indicated she expected this and quickly disconnected the call.

## 2023-12-03 ENCOUNTER — Ambulatory Visit: Attending: Gynecologic Oncology | Admitting: Physical Therapy

## 2023-12-03 ENCOUNTER — Other Ambulatory Visit: Payer: Self-pay

## 2023-12-03 ENCOUNTER — Encounter: Payer: Self-pay | Admitting: Physical Therapy

## 2023-12-03 DIAGNOSIS — R262 Difficulty in walking, not elsewhere classified: Secondary | ICD-10-CM | POA: Diagnosis not present

## 2023-12-03 DIAGNOSIS — M6281 Muscle weakness (generalized): Secondary | ICD-10-CM | POA: Diagnosis not present

## 2023-12-03 DIAGNOSIS — M6289 Other specified disorders of muscle: Secondary | ICD-10-CM | POA: Insufficient documentation

## 2023-12-03 NOTE — Therapy (Signed)
 OUTPATIENT PHYSICAL THERAPY FEMALE PELVIC EVALUATION   Patient Name: ACQUANETTA CABANILLA MRN: [id number] DOB:1986-01-09, 38 y.o., female Today's Date: 12/03/2023  END OF SESSION:  PT End of Session - 12/03/23 1032     Visit Number 1    Date for PT Re-Evaluation 06/04/24    Authorization Type aetna and [employer] medicaid- no auth needed    PT Start Time 1015    PT Stop Time 1100    PT Time Calculation (min) 45 min    Activity Tolerance Patient tolerated treatment well;Patient limited by pain    Behavior During Therapy WFL for tasks assessed/performed          Past Medical History:  Diagnosis Date   Anxiety    Depression    Fatty liver    Gestational diabetes    metformin  - resolved after delivery   Hiatal hernia    History of UTI    HSV (herpes simplex virus) infection    Migraines    Peripheral vascular disease (HCC)    Post-operative nausea and vomiting 12/19/2022   Seizures (HCC)    Jan.2016 - only had one seizure, none since   Sleep apnea    Vaginal Pap smear, abnormal    Past Surgical History:  Procedure Laterality Date   ABDOMINAL HYSTERECTOMY  12/16/2022   BIOPSY  12/24/2022   Procedure: BIOPSY;  Surgeon: Leigh Elspeth SQUIBB, MD;  Location: [facility];  Service: Gastroenterology;;   DILATION AND CURETTAGE OF UTERUS     ESOPHAGOGASTRODUODENOSCOPY (EGD) WITH PROPOFOL  N/A 12/24/2022   Procedure: ESOPHAGOGASTRODUODENOSCOPY (EGD) WITH PROPOFOL ;  Surgeon: Leigh Elspeth SQUIBB, MD;  Location: [facility];  Service: Gastroenterology;  Laterality: N/A;   LAPAROSCOPIC TUBAL LIGATION Bilateral 12/08/2018   Procedure: LAPAROSCOPIC TUBAL LIGATION;  Surgeon: Alger Gong, MD;  Location: [facility];  Service: Gynecology;  Laterality: Bilateral;   LEEP N/A 03/18/2022   Procedure: LOOP ELECTROSURGICAL EXCISION PROCEDURE (LEEP);  Surgeon: Alger Gong, MD;  Location: [facility];  Service: Gynecology;  Laterality: N/A;   TUBAL LIGATION     VAGINAL HYSTERECTOMY N/A  12/16/2022   Procedure: TOTAL VAGINAL HYSTERECTOMY, BILATERAL SALPINGECTOMY;  Surgeon: Lorence Ozell CROME, MD;  Location: [facility];  Service: Gynecology;  Laterality: N/A;   WISDOM TOOTH EXTRACTION  04/21/2009   Patient Active Problem List   Diagnosis Date Noted   Colitis 10/25/2023   Lactic acidosis 10/25/2023   History of cervical cancer 10/25/2023   Hypokalemia 10/25/2023   Blurry vision, bilateral 10/16/2023   Abnormal uterine bleeding (AUB) 10/16/2023   Nausea 09/04/2023   Diarrhea 09/04/2023   Frequent urination 09/04/2023   Lower abdominal pain 07/24/2023   Rectal bleed 07/24/2023   Left arm weakness 05/08/2023   History of cervical dysplasia 05/08/2023   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   Left sided sciatica 04/23/2023   Low magnesium  level 04/07/2023   Vaginal irritation 01/28/2023   Cervical cancer (HCC) 12/29/2022   Duodenal ulcer 12/24/2022   S/P hysterectomy 12/19/2022   History of syncope 12/10/2022   Chronic pain of left knee 12/10/2022   Family history of colon cancer 10/10/2015    PCP: Corwin Antu, FNP  REFERRING PROVIDER: [name] Comer SAUNDERS, MD  REFERRING DIAG: M62.89 (ICD-10-CM) - Pelvic floor dysfunction  THERAPY DIAG:  Muscle weakness (generalized)  Difficulty in walking, not elsewhere classified  Rationale for Evaluation and Treatment: Rehabilitation ONSET DATE: 11/2022  SUBJECTIVE:  SUBJECTIVE STATEMENT: Patient reports that she had vaginal hysterectomy 11/2022 and then they found cervical cancer. She had a second surgery October 2024. Was walking with a walker, recovering until December. She was cramping and having pelvic pain. Had a fall at work which messed up her right hip in May. She is experiencing right groin pressure with breathing, feels like  it wants to pop. She goes to the doctor every 3 months not and they keep finding lumps, but it's nothing.  Heard a pop every time she works on a machine at [employer], leg press like and it hurts, so she stopped Patient reports weak after her surgeries, wants to return to the gym. Has 4 children  PAIN:  Are you having pain? Yes NPRS scale: 7/10 Pain location: Right groin- superficial  Pain type: dull, pain Pain description: intermittent   Aggravating factors: walking 5 mins Relieving factors: heat and ice  PRECAUTIONS: None  RED FLAGS: None   WEIGHT BEARING RESTRICTIONS: No  FALLS:  Has patient fallen in last 6 months? Yes. Number of falls 1  OCCUPATION: operator, IT  ACTIVITY LEVEL : walks  PLOF: Independent  PATIENT GOALS: reduced pain in the right hip  PERTINENT HISTORY:  Hysterectomy 2024 Sexual abuse: No  BOWEL MOVEMENT: no issues   URINATION: no issues  INTERCOURSE:  Ability to have vaginal penetration Yes  Pain with intercourse: During Penetration and After Intercourse- has to take tylenol  DrynessNo Climax: yes Marinoff Scale: 3/3 Lubricant: no  PREGNANCY:  Vaginal deliveries 4 Tearing Yes:   Episiotomy No C-section deliveries 0 Currently pregnant No  PROLAPSE: None   OBJECTIVE:  Note: Objective measures were completed at Evaluation unless otherwise noted.   PATIENT SURVEYS:   PFIQ-7: 67  COGNITION: Overall cognitive status: Within functional limits for tasks assessed     SENSATION: Light touch: Appears intact   FUNCTIONAL TESTS:  Squat: knee valgus bilat Single leg stance: difficult  Rt:  Lt: Curl-up test: 1/3  Right hip scour- positive for pain Right hip tight in IR and ER Guarded movement throughout Weakness present   GAIT: Assistive device utilized: None Comments: slightly antalgic on rt  POSTURE: increased lumbar lordosis   LUMBARAROM/PROM:  A/PROM A/PROM  Eval (% available)  Flexion 75%  Extension 75%   Right lateral flexion 75%  Left lateral flexion 75%  Right rotation 75%  Left rotation 75%   (Blank rows = not tested)  PALPATION:   General: no tenderness right adductors or glutes  Pelvic Alignment: pelvic dips with SLR in supine  Abdominal: weakness present- compensates with SLR in supine                External Perineal Exam: to be assessed                              Internal Pelvic Floor: to be assessed   Patient confirms identification and approves PT to assess internal pelvic floor and treatment No  PELVIC MMT:   MMT eval  Vaginal   Internal Anal Sphincter   External Anal Sphincter   Puborectalis   Diastasis Recti 1 finger with doming throughout  (Blank rows = not tested)        TONE: to be assessed  PROLAPSE: to be assessed  TODAY'S TREATMENT:  DATE:  12/03/2023   PATIENT EDUCATION:  Education details: Pt was educated on relevant anatomy, exam findings, home exercise program, plan of care, expectations of PT and to avoid aggravating hip exercises  Person educated: Patient Education method: Explanation, Demonstration, Tactile cues, Verbal cues, and Handouts Education comprehension: verbalized understanding  HOME EXERCISE PROGRAM: Access Code: [email or web address] URL: [email or web address] Date: 12/03/2023 Prepared by: Cori Myrical Andujo  Exercises - Supine Bridge with Pelvic Floor Contraction  - 1 x daily - 7 x weekly - 2 sets - 10 reps - Supine Figure 4 Piriformis Stretch  - 1 x daily - 7 x weekly - 2 sets - 10 reps - Active Straight Leg Raise with Quad Set  - 1 x daily - 7 x weekly - 2 sets - 10 reps  ASSESSMENT:  CLINICAL IMPRESSION: Patient is a 38 y.o. F who was seen today for physical therapy evaluation and treatment for right hip pain and dyspareunia after a couple of surgeries, hospitalization and a fall. She is  globally weak, has  a lot of stiffness right hip and difficulty with walking short distances- 5 mins at work. She reported that she is sedentary at work and will look into getting a standing desk.   Exam findings are notable for upper chest breathing strategies, abdominal and right hip weakness. Patient demonstrates fair trunk mobility. It is difficult for patient to walk and have intercourse due to vaginal pain and right hip pain . Discussed findings with patient, educated patient on internal pelvic floor assessment next visit and HEP was initiated. Patient's quality of life has been affected, patient will benefit from physical therapy to address deficits, reduce pain in hip  and improve mobility  and quality of life.    OBJECTIVE IMPAIRMENTS: decreased activity tolerance, decreased coordination, decreased endurance, decreased mobility, decreased ROM, decreased strength, increased fascial restrictions, increased muscle spasms, impaired flexibility, impaired tone, improper body mechanics, postural dysfunction, and pain.   ACTIVITY LIMITATIONS: sitting and locomotion level  PARTICIPATION LIMITATIONS: interpersonal relationship, community activity, and occupation  PERSONAL FACTORS: Time since onset of injury/illness/exacerbation are also affecting patient's functional outcome.   REHAB POTENTIAL: Good  CLINICAL DECISION MAKING: Evolving/moderate complexity  EVALUATION COMPLEXITY: Moderate   GOALS: Goals reviewed with patient? Yes  SHORT TERM GOALS: Target date: 12/31/2023    Pt will be independent with HEP.   Baseline: Goal status: INITIAL  2.  Patient will be educated on oh nuts to reduce pelvic pain with intercourse Baseline:  Goal status: INITIAL  3.  Patient will demonstrate improved right hip strength to 4/5 Baseline:  Goal status: INITIAL  4.  Patient will be able to walk at least 15 mins without increase right hip pain Baseline: 4-/5 flexion Goal status: INITIAL   LONG  TERM GOALS: Target date: 06/05/2023  Pt will be independent with advanced HEP.   Baseline:  Goal status: INITIAL  2.  Patient will be able to walk at least for 30 mins without increased pain Baseline:  Goal status: INITIAL  3.  Patient will have 5/5 stretch right hip Baseline:  Goal status: INITIAL  4.  Good AROM/ PROM right hip to normal Baseline:  Goal status: INITIAL  5.  No pain with intercourse with boyfriend Baseline: 10/10 Goal status: INITIAL  6.  Return to the gym without increased hip pain Baseline:  Goal status: INITIAL  PLAN:  PT FREQUENCY: 1-2x/week  PT DURATION: 6 months   PLANNED INTERVENTIONS: 97164- PT Re-evaluation, 97110-Therapeutic exercises, 97530- Therapeutic activity, 97112- Neuromuscular re-education,  02464- Self Care, 02859- Manual therapy, [id number]- Gait training, [phone]- Aquatic Therapy, [phone]- Electrical stimulation (unattended), [phone]- Traction (mechanical), [id number]- Ionotophoresis 4mg /ml Dexamethasone , 20560 (1-2 muscles), 20561 (3+ muscles)- Dry Needling, Patient/Family education, Balance training, Taping, Joint mobilization, Joint manipulation, Spinal manipulation, Spinal mobilization, Scar mobilization, Vestibular training, Cryotherapy, Moist heat, and Biofeedback  PLAN FOR NEXT SESSION: internal pelvic floor assessment, continue exercises for hip strengthening as she can, avoid aggravating IR, add non irritating stretches the HEP   Jennene Downie, PT 12/03/2023, 11:53 AM

## 2023-12-08 ENCOUNTER — Telehealth: Payer: Self-pay | Admitting: Physician Assistant

## 2023-12-08 ENCOUNTER — Other Ambulatory Visit: Payer: Self-pay

## 2023-12-08 ENCOUNTER — Emergency Department (HOSPITAL_BASED_OUTPATIENT_CLINIC_OR_DEPARTMENT_OTHER)

## 2023-12-08 ENCOUNTER — Encounter (HOSPITAL_BASED_OUTPATIENT_CLINIC_OR_DEPARTMENT_OTHER): Payer: Self-pay

## 2023-12-08 ENCOUNTER — Inpatient Hospital Stay (HOSPITAL_BASED_OUTPATIENT_CLINIC_OR_DEPARTMENT_OTHER)
Admission: EM | Admit: 2023-12-08 | Discharge: 2023-12-13 | DRG: 392 | Disposition: A | Discharge status: Home / Self Care | Attending: Internal Medicine | Admitting: Internal Medicine

## 2023-12-08 DIAGNOSIS — B9620 Unspecified Escherichia coli [E. coli] as the cause of diseases classified elsewhere: Secondary | ICD-10-CM | POA: Diagnosis not present

## 2023-12-08 DIAGNOSIS — R1030 Lower abdominal pain, unspecified: Secondary | ICD-10-CM | POA: Diagnosis not present

## 2023-12-08 DIAGNOSIS — Z8541 Personal history of malignant neoplasm of cervix uteri: Secondary | ICD-10-CM

## 2023-12-08 DIAGNOSIS — E66811 Obesity, class 1: Secondary | ICD-10-CM | POA: Diagnosis not present

## 2023-12-08 DIAGNOSIS — I959 Hypotension, unspecified: Secondary | ICD-10-CM | POA: Diagnosis present

## 2023-12-08 DIAGNOSIS — N390 Urinary tract infection, site not specified: Secondary | ICD-10-CM | POA: Diagnosis present

## 2023-12-08 DIAGNOSIS — R001 Bradycardia, unspecified: Secondary | ICD-10-CM | POA: Diagnosis present

## 2023-12-08 DIAGNOSIS — R0789 Other chest pain: Secondary | ICD-10-CM | POA: Diagnosis not present

## 2023-12-08 DIAGNOSIS — E872 Acidosis, unspecified: Secondary | ICD-10-CM | POA: Diagnosis not present

## 2023-12-08 DIAGNOSIS — K529 Noninfective gastroenteritis and colitis, unspecified: Principal | ICD-10-CM | POA: Diagnosis present

## 2023-12-08 DIAGNOSIS — R7989 Other specified abnormal findings of blood chemistry: Secondary | ICD-10-CM | POA: Diagnosis not present

## 2023-12-08 DIAGNOSIS — Z9851 Tubal ligation status: Secondary | ICD-10-CM | POA: Diagnosis not present

## 2023-12-08 DIAGNOSIS — Z90710 Acquired absence of both cervix and uterus: Secondary | ICD-10-CM

## 2023-12-08 DIAGNOSIS — R079 Chest pain, unspecified: Secondary | ICD-10-CM | POA: Diagnosis not present

## 2023-12-08 DIAGNOSIS — G4730 Sleep apnea, unspecified: Secondary | ICD-10-CM | POA: Diagnosis not present

## 2023-12-08 DIAGNOSIS — R1032 Left lower quadrant pain: Secondary | ICD-10-CM | POA: Diagnosis not present

## 2023-12-08 DIAGNOSIS — Z881 Allergy status to other antibiotic agents status: Secondary | ICD-10-CM

## 2023-12-08 DIAGNOSIS — I739 Peripheral vascular disease, unspecified: Secondary | ICD-10-CM | POA: Diagnosis not present

## 2023-12-08 DIAGNOSIS — K219 Gastro-esophageal reflux disease without esophagitis: Secondary | ICD-10-CM | POA: Diagnosis present

## 2023-12-08 DIAGNOSIS — R112 Nausea with vomiting, unspecified: Secondary | ICD-10-CM | POA: Diagnosis not present

## 2023-12-08 DIAGNOSIS — F418 Other specified anxiety disorders: Secondary | ICD-10-CM | POA: Diagnosis not present

## 2023-12-08 DIAGNOSIS — D649 Anemia, unspecified: Secondary | ICD-10-CM | POA: Diagnosis not present

## 2023-12-08 DIAGNOSIS — K5904 Chronic idiopathic constipation: Secondary | ICD-10-CM

## 2023-12-08 DIAGNOSIS — Z8249 Family history of ischemic heart disease and other diseases of the circulatory system: Secondary | ICD-10-CM | POA: Diagnosis not present

## 2023-12-08 DIAGNOSIS — Z800 Family history of malignant neoplasm of digestive organs: Secondary | ICD-10-CM

## 2023-12-08 DIAGNOSIS — K648 Other hemorrhoids: Secondary | ICD-10-CM | POA: Diagnosis present

## 2023-12-08 DIAGNOSIS — Z6833 Body mass index (BMI) 33.0-33.9, adult: Secondary | ICD-10-CM | POA: Diagnosis not present

## 2023-12-08 DIAGNOSIS — D72829 Elevated white blood cell count, unspecified: Secondary | ICD-10-CM | POA: Diagnosis not present

## 2023-12-08 DIAGNOSIS — D122 Benign neoplasm of ascending colon: Secondary | ICD-10-CM | POA: Diagnosis not present

## 2023-12-08 DIAGNOSIS — K6389 Other specified diseases of intestine: Secondary | ICD-10-CM | POA: Diagnosis not present

## 2023-12-08 DIAGNOSIS — R109 Unspecified abdominal pain: Secondary | ICD-10-CM | POA: Diagnosis not present

## 2023-12-08 DIAGNOSIS — Z87891 Personal history of nicotine dependence: Secondary | ICD-10-CM

## 2023-12-08 DIAGNOSIS — Z79899 Other long term (current) drug therapy: Secondary | ICD-10-CM

## 2023-12-08 DIAGNOSIS — K760 Fatty (change of) liver, not elsewhere classified: Secondary | ICD-10-CM | POA: Diagnosis present

## 2023-12-08 DIAGNOSIS — F17210 Nicotine dependence, cigarettes, uncomplicated: Secondary | ICD-10-CM | POA: Diagnosis present

## 2023-12-08 DIAGNOSIS — K6289 Other specified diseases of anus and rectum: Secondary | ICD-10-CM | POA: Diagnosis not present

## 2023-12-08 DIAGNOSIS — Z833 Family history of diabetes mellitus: Secondary | ICD-10-CM | POA: Diagnosis not present

## 2023-12-08 DIAGNOSIS — E876 Hypokalemia: Secondary | ICD-10-CM | POA: Diagnosis not present

## 2023-12-08 LAB — CBC WITH DIFFERENTIAL/PLATELET
Abs Immature Granulocytes: 0.04 K/uL (ref 0.00–0.07)
Basophils Absolute: 0.1 K/uL (ref 0.0–0.1)
Basophils Relative: 1 %
Eosinophils Absolute: 0.2 K/uL (ref 0.0–0.5)
Eosinophils Relative: 2 %
HCT: 38.6 % (ref 36.0–46.0)
Hemoglobin: 12.7 g/dL (ref 12.0–15.0)
Immature Granulocytes: 0 %
Lymphocytes Relative: 19 %
Lymphs Abs: 2.5 K/uL (ref 0.7–4.0)
MCH: 30.7 pg (ref 26.0–34.0)
MCHC: 32.9 g/dL (ref 30.0–36.0)
MCV: 93.2 fL (ref 80.0–100.0)
Monocytes Absolute: 0.7 K/uL (ref 0.1–1.0)
Monocytes Relative: 5 %
Neutro Abs: 9.8 K/uL — ABNORMAL HIGH (ref 1.7–7.7)
Neutrophils Relative %: 73 %
Platelets: 370 K/uL (ref 150–400)
RBC: 4.14 MIL/uL (ref 3.87–5.11)
RDW: 13 % (ref 11.5–15.5)
WBC: 13.3 K/uL — ABNORMAL HIGH (ref 4.0–10.5)
nRBC: 0 % (ref 0.0–0.2)

## 2023-12-08 LAB — LACTIC ACID, PLASMA
Lactic Acid, Venous: 2.8 mmol/L (ref 0.5–1.9)
Lactic Acid, Venous: 2.2 mmol/L (ref 0.5–1.9)
Lactic Acid, Venous: 1.5 mmol/L (ref 0.5–1.9)

## 2023-12-08 LAB — LIPASE, BLOOD: Lipase: 17 U/L (ref 11–51)

## 2023-12-08 LAB — D-DIMER, QUANTITATIVE: D-Dimer, Quant: <0.27 ug{FEU}/mL (ref 0.00–0.50)

## 2023-12-08 LAB — PROCALCITONIN: Procalcitonin: <0.1 ng/mL

## 2023-12-08 LAB — URINALYSIS, ROUTINE W REFLEX MICROSCOPIC
Bilirubin Urine: NEGATIVE
Glucose, UA: 250 mg/dL — AB
Ketones, ur: NEGATIVE mg/dL
Leukocytes,Ua: NEGATIVE
Nitrite: POSITIVE — AB
Protein, ur: NEGATIVE mg/dL
Specific Gravity, Urine: 1.02 (ref 1.005–1.030)
pH: 7 (ref 5.0–8.0)

## 2023-12-08 LAB — CBC
HCT: 36.2 % (ref 36.0–46.0)
Hemoglobin: 11.9 g/dL — ABNORMAL LOW (ref 12.0–15.0)
MCH: 30.9 pg (ref 26.0–34.0)
MCHC: 32.9 g/dL (ref 30.0–36.0)
MCV: 94 fL (ref 80.0–100.0)
Platelets: 339 K/uL (ref 150–400)
RBC: 3.85 MIL/uL — ABNORMAL LOW (ref 3.87–5.11)
RDW: 13 % (ref 11.5–15.5)
WBC: 11.4 K/uL — ABNORMAL HIGH (ref 4.0–10.5)
nRBC: 0 % (ref 0.0–0.2)

## 2023-12-08 LAB — URINALYSIS, MICROSCOPIC (REFLEX)

## 2023-12-08 LAB — COMPREHENSIVE METABOLIC PANEL WITH GFR
ALT: 9 U/L (ref 0–44)
AST: 16 U/L (ref 15–41)
Albumin: 4.2 g/dL (ref 3.5–5.0)
Alkaline Phosphatase: 67 U/L (ref 38–126)
Anion gap: 13 (ref 5–15)
BUN: 14 mg/dL (ref 6–20)
CO2: 24 mmol/L (ref 22–32)
Calcium: 9.5 mg/dL (ref 8.9–10.3)
Chloride: 106 mmol/L (ref 98–111)
Creatinine, Ser: 1.07 mg/dL — ABNORMAL HIGH (ref 0.44–1.00)
GFR, Estimated: >60 mL/min (ref >60)
Glucose, Bld: 146 mg/dL — ABNORMAL HIGH (ref 70–99)
Potassium: 3.6 mmol/L (ref 3.5–5.1)
Sodium: 142 mmol/L (ref 135–145)
Total Bilirubin: 0.5 mg/dL (ref 0.0–1.2)
Total Protein: 7 g/dL (ref 6.5–8.1)

## 2023-12-08 LAB — CREATININE, SERUM
Creatinine, Ser: 0.99 mg/dL (ref 0.44–1.00)
GFR, Estimated: >60 mL/min (ref >60)

## 2023-12-08 LAB — TROPONIN T, HIGH SENSITIVITY
Troponin T High Sensitivity: <15 ng/L (ref 0–19)
Troponin T High Sensitivity: <15 ng/L (ref 0–19)

## 2023-12-08 MED ORDER — SODIUM CHLORIDE 0.9 % IV SOLN: INTRAVENOUS | Status: AC

## 2023-12-08 MED ORDER — PIPERACILLIN-TAZOBACTAM 3.375 G IVPB 30 MIN
3.3750 g | Freq: Once | INTRAVENOUS | Status: AC
  Administered 2023-12-08: 3.375 g via INTRAVENOUS
  Filled 2023-12-08: qty 50

## 2023-12-08 MED ORDER — SODIUM CHLORIDE 0.9 % IV BOLUS
1000.0000 mL | Freq: Once | INTRAVENOUS | Status: AC
  Administered 2023-12-08: 1000 mL via INTRAVENOUS

## 2023-12-08 MED ORDER — LACTATED RINGERS IV SOLN: INTRAVENOUS | Status: DC

## 2023-12-08 MED ORDER — ONDANSETRON HCL 4 MG/2ML IJ SOLN
4.0000 mg | Freq: Once | INTRAMUSCULAR | Status: AC
  Administered 2023-12-08: 4 mg via INTRAVENOUS
  Filled 2023-12-08: qty 2

## 2023-12-08 MED ORDER — ORAL CARE MOUTH RINSE
15.0000 mL | OROMUCOSAL | Status: DC | PRN
Start: 2023-12-08 — End: 2023-12-13

## 2023-12-08 MED ORDER — HYDROMORPHONE HCL 1 MG/ML IJ SOLN
1.0000 mg | INTRAMUSCULAR | Status: DC | PRN
  Administered 2023-12-08 – 2023-12-12 (×8): 1 mg via INTRAVENOUS
  Filled 2023-12-08 (×8): qty 1

## 2023-12-08 MED ORDER — IOHEXOL 300 MG/ML  SOLN
125.0000 mL | Freq: Once | INTRAMUSCULAR | Status: AC | PRN
  Administered 2023-12-08: 125 mL via INTRAVENOUS

## 2023-12-08 MED ORDER — HYDROMORPHONE HCL 1 MG/ML IJ SOLN
1.0000 mg | Freq: Once | INTRAMUSCULAR | Status: AC
  Administered 2023-12-08: 1 mg via INTRAVENOUS
  Filled 2023-12-08: qty 1

## 2023-12-08 MED ORDER — LACTATED RINGERS IV BOLUS
1000.0000 mL | Freq: Once | INTRAVENOUS | Status: AC
  Administered 2023-12-08: 1000 mL via INTRAVENOUS

## 2023-12-08 MED ORDER — PANTOPRAZOLE SODIUM 40 MG PO TBEC
40.0000 mg | DELAYED_RELEASE_TABLET | Freq: Every day | ORAL | Status: DC
  Administered 2023-12-08 – 2023-12-13 (×6): 40 mg via ORAL
  Filled 2023-12-08 (×6): qty 1

## 2023-12-08 MED ORDER — POLYETHYLENE GLYCOL 3350 17 G PO PACK
17.0000 g | PACK | Freq: Every day | ORAL | Status: DC
  Administered 2023-12-08: 17 g via ORAL
  Filled 2023-12-08: qty 1

## 2023-12-08 MED ORDER — FENTANYL CITRATE PF 50 MCG/ML IJ SOSY
50.0000 ug | PREFILLED_SYRINGE | Freq: Once | INTRAMUSCULAR | Status: AC
  Administered 2023-12-08: 50 ug via INTRAVENOUS
  Filled 2023-12-08: qty 1

## 2023-12-08 MED ORDER — ENOXAPARIN SODIUM 40 MG/0.4ML IJ SOSY
40.0000 mg | PREFILLED_SYRINGE | INTRAMUSCULAR | Status: DC
  Filled 2023-12-08 (×2): qty 0.4

## 2023-12-08 MED ORDER — PIPERACILLIN-TAZOBACTAM 3.375 G IVPB
3.3750 g | Freq: Three times a day (TID) | INTRAVENOUS | Status: DC
  Administered 2023-12-08 – 2023-12-12 (×12): 3.375 g via INTRAVENOUS
  Filled 2023-12-08 (×13): qty 50

## 2023-12-08 NOTE — Progress Notes (Signed)
 Patient refused labwork. Notified provider.

## 2023-12-08 NOTE — ED Provider Notes (Signed)
 [place] [facility] AT [facility]  Provider Note  CSN: [id number] Arrival date & time: 12/08/23 0148  History Chief Complaint  Patient presents with   Abdominal Pain   Chest Pain    Holly Brown is a 38 y.o. female with history of cervical cancer, s/p hysterectomy, not currently under any treatment also had admission in July for colitis and found to have a left sided ovarian cyst reports she was in her usual state of health until around 10pm when she began having N/V/D and LLQ abdominal pain. Since then has also been having some chest discomfort which she feels is two different pains. No reported fever.   Home Medications Prior to Admission medications   Medication Sig Start Date End Date Taking? Authorizing Provider  omeprazole  (PRILOSEC) 40 MG capsule Take 1 capsule (40 mg total) by mouth daily. 11/02/23   [place], PA-C  polyethylene glycol (MIRALAX  / GLYCOLAX ) 17 g packet Take 17 g by mouth daily. 11/02/23   [place], PA-C     Allergies    Keflex  [cephalexin ]   Review of Systems   Review of Systems Please see HPI for pertinent positives and negatives  Physical Exam BP 114/63   Pulse (!) 50   Temp 98 F (36.7 C)   Resp 17   Ht 5' 10 (1.778 m)   Wt 106.6 kg   LMP  (LMP Unknown) Comment: Depo Injections - last injection 10/2022, had spotting on and off since 10/2022 injection  SpO2 100%   BMI 33.72 kg/m   Physical Exam Vitals and nursing note reviewed.  Constitutional:      Appearance: Normal appearance.  HENT:     Head: Normocephalic and atraumatic.     Nose: Nose normal.     Mouth/Throat:     Mouth: Mucous membranes are moist.  Eyes:     Extraocular Movements: Extraocular movements intact.     Conjunctiva/sclera: Conjunctivae normal.  Cardiovascular:     Rate and Rhythm: Normal rate.  Pulmonary:     Effort: Pulmonary effort is normal.     Breath sounds: Normal breath sounds.  Abdominal:     General: Abdomen is  flat.     Palpations: Abdomen is soft.     Tenderness: There is abdominal tenderness in the left lower quadrant. There is guarding. Negative signs include Murphy's sign and McBurney's sign.  Musculoskeletal:        General: No swelling. Normal range of motion.     Cervical back: Neck supple.  Skin:    General: Skin is warm and dry.  Neurological:     General: No focal deficit present.     Mental Status: She is alert.  Psychiatric:        Mood and Affect: Mood normal.     ED Results / Procedures / Treatments   EKG EKG Interpretation Date/Time:  Tuesday December 08 2023 02:22:32 EDT Ventricular Rate:  59 PR Interval:  157 QRS Duration:  84 QT Interval:  426 QTC Calculation: 422 R Axis:   62  Text Interpretation: Sinus rhythm Atrial premature complex Since last tracing Rate slower Confirmed by Roselyn Dunnings [phone]) on 12/08/2023 2:26:08 AM  Procedures Procedures  Medications Ordered in the ED Medications  lactated ringers  bolus 1,000 mL (has no administration in time range)  lactated ringers  infusion (has no administration in time range)  fentaNYL  (SUBLIMAZE ) injection 50 mcg (50 mcg Intravenous Given 12/08/23 0217)  ondansetron  (ZOFRAN ) injection 4 mg (4 mg  Intravenous Given 12/08/23 0217)  sodium chloride  0.9 % bolus 1,000 mL (0 mLs Intravenous Stopped 12/08/23 0413)  iohexol  (OMNIPAQUE ) 300 MG/ML solution 125 mL (125 mLs Intravenous Contrast Given 12/08/23 0255)  HYDROmorphone  (DILAUDID ) injection 1 mg (1 mg Intravenous Given 12/08/23 0318)    Initial Impression and Plan  Patient here with return of LLQ abdominal pain, recent admission for similar with colitis on CT. Also had an ovarian cyst, last imaged 7/6, repeat planned for later this month. Chest pain is nonspecific. Will check labs, EKG, plan CT to re-evaluate abdominal pain. Will give pain, nausea meds and IVF in the meantime for comfort.   ED Course   Clinical Course as of 12/08/23 0555  Tue Dec 08, 2023  0212 CBC  with leukocytosis.  [CS]  0228 I personally viewed the images from radiology studies and agree with radiologist interpretation: CXR is clear [CS]  0237 CMP, lipase and Trop are neg.  [CS]  0242 Dimer is neg.  [CS]  0253 UA with nitrites, WBC and bacteria. Will send for culture.  [CS]  0314 Lactic acid is mildly elevated, will recheck after IVF.  [CS]  [id number] I personally viewed the images from radiology studies and agree with radiologist interpretation: CT with persistent signs of colitis.  [CS]  0451 Delta trop remains normal. Awaiting repeat lactic acid.  [CS]  0505 Lactic acid is improving.  [CS]  0511 Patient reports pain improved, but not resolved. Given unclear etiology of her recurrent colitis, will discuss admission with Hospitalist to expedite GI evaluation. Patient amenable to this plan.  [CS]  [phone] Spoke with Dr. Keturah, Hospitalist, who will accept for admission. We discussed CTA for ischemic etiology, but will defer for now. Check stool studies, begin Abx, additional IVF and continue to trend lactic acid.  [CS]    Clinical Course User Index [CS] Roselyn Carlin NOVAK, MD     MDM Rules/Calculators/A&P Medical Decision Making Problems Addressed: Colitis: acute illness or injury  Amount and/or Complexity of Data Reviewed Labs: ordered. Decision-making details documented in ED Course. Radiology: ordered and independent interpretation performed. Decision-making details documented in ED Course.  Risk Prescription drug management. Parenteral controlled substances. Decision regarding hospitalization.     Final Clinical Impression(s) / ED Diagnoses Final diagnoses:  Colitis    Rx / DC Orders ED Discharge Orders     None        Roselyn Carlin NOVAK, MD 12/08/23 [phone]

## 2023-12-08 NOTE — Telephone Encounter (Signed)
 Inbound call from patient wanting to inform that she is currently hospitalized for colitis. Patient is requesting a call back. Please advise, thank you

## 2023-12-08 NOTE — Plan of Care (Signed)
 Plan of Care Note for accepted transfer  Patient: Holly Brown    FMW:[id number]  DOA: 12/08/2023     Facility requesting transfer: [facility] ED  Requesting Provider: Roselyn Carlin NOVAK, MD  Reason for transfer: Colitis  Facility course:   76 F with hx of cervical cancer + hysterectomy, duodenal ulcer, hx of recent admission for colitis in 7/'25. Seen by GI OP (follows with [facility]) after recent admit and planned for OP colonoscopy. Returned with acute LLQ pain , N/V/D. Lactate 2.8 -> 2.2. WBC up to 13. CT A/P decompressed colon, appearing thick walled c/w colitis.  Treated with 1 L of IVF, pain meds, antiemetics. Advised to give additional 1 L IVF, start on mIVF at 100 cc/hr, trend lactate, start Ceftriaxone  2 g IV q 24 hr, and Flagyl  500 mg IV q12 hr ( or zosyn  as alternative), send for GI path / c diff if able, maintain enteric precautions. Question raised about ? Mesenteric ischemia with hx of vascular disease in the patient and EDP asking about if CTA needed. I called and discussed with rad who has reviewed appears to be no thrombus, no sign of vasculitis, and less consistent with diffuse pattern of colonic wall thickening / underdistension. Clinically no hematochezia, and think lactate is probably from GI losses, so think mesenteric  ischemia less likely.   Plan of care: The patient is accepted for admission to Telemetry unit, at [facility].    Author: Dorn Dawson, MD  12/08/2023  Check [email or web address] for on-call coverage.  Nursing staff, Please call [facility] Admits & Consults System-Wide number on Amion as soon as patient's arrival, so appropriate admitting provider can evaluate the pt.

## 2023-12-08 NOTE — Telephone Encounter (Signed)
 [place]ise- FYI  Patient is currently scheduled for colonoscopy on 01/04/24 in [facility] with Dr Albertus.

## 2023-12-08 NOTE — H&P (Signed)
 History and Physical    Patient: Holly Brown FMW:[id number] DOB: [birth year] DOA: 12/08/2023 DOS: the patient was seen and examined on 12/08/2023 PCP: Corwin Antu, FNP  Patient coming from: Home  Chief Complaint:  Chief Complaint  Patient presents with   Abdominal Pain   Chest Pain   HPI: Holly Brown is a 38 y.o. female with medical history significant of cervical cancer s/p hysterectomy, duodenal ulcer, and recent admission for colitis in 10/2023 who follows with  GI OP w/ plan for CSY who p/w acute LLQ pain, N/V/D, and elevated lactate and WBC as well as imaging c/w colitis.  Pt was in her USOH until yesterday at 2200. Pt remembers waking up and immediately having to use the restroom. She defecated about 8 times in an hour. She would use the restroom, wipe, and leave before having to return quickly for another episode of diarrhea. She was managing the diarrhea, when she vomited around midnight and presented to the ED.  In the ED, pt bradycardic, and hypotensive. Labs notable for WBC 13.3-->11.4, lactic acid 2.8-->1.5, and procalcitonin wnl. EDP requested admission for colitis.  Review of Systems: As mentioned in the history of present illness. All other systems reviewed and are negative. Past Medical History:  Diagnosis Date   Anxiety    Depression    Fatty liver    Gestational diabetes    metformin  - resolved after delivery   Hiatal hernia    History of UTI    HSV (herpes simplex virus) infection    Migraines    Peripheral vascular disease (HCC)    Post-operative nausea and vomiting 12/19/2022   Seizures (HCC)    Jan.2016 - only had one seizure, none since   Sleep apnea    Vaginal Pap smear, abnormal    Past Surgical History:  Procedure Laterality Date   ABDOMINAL HYSTERECTOMY  12/16/2022   BIOPSY  12/24/2022   Procedure: BIOPSY;  Surgeon: Leigh Elspeth SQUIBB, MD;  Location: [facility];  Service: Gastroenterology;;   DILATION AND CURETTAGE OF UTERUS      ESOPHAGOGASTRODUODENOSCOPY (EGD) WITH PROPOFOL  N/A 12/24/2022   Procedure: ESOPHAGOGASTRODUODENOSCOPY (EGD) WITH PROPOFOL ;  Surgeon: Leigh Elspeth SQUIBB, MD;  Location: [facility] ENDOSCOPY;  Service: Gastroenterology;  Laterality: N/A;   LAPAROSCOPIC TUBAL LIGATION Bilateral 12/08/2018   Procedure: LAPAROSCOPIC TUBAL LIGATION;  Surgeon: Alger Gong, MD;  Location: [facility];  Service: Gynecology;  Laterality: Bilateral;   LEEP N/A 03/18/2022   Procedure: LOOP ELECTROSURGICAL EXCISION PROCEDURE (LEEP);  Surgeon: Alger Gong, MD;  Location: [facility] OR;  Service: Gynecology;  Laterality: N/A;   TUBAL LIGATION     VAGINAL HYSTERECTOMY N/A 12/16/2022   Procedure: TOTAL VAGINAL HYSTERECTOMY, BILATERAL SALPINGECTOMY;  Surgeon: Lorence Ozell CROME, MD;  Location: [facility] OR;  Service: Gynecology;  Laterality: N/A;   WISDOM TOOTH EXTRACTION  04/21/2009   Social History:  reports that she has quit smoking. Her smoking use included cigarettes. She started smoking about 11 years ago. She has a 3 pack-year smoking history. She has never been exposed to tobacco smoke. She has never used smokeless tobacco. She reports current alcohol use. She reports that she does not currently use drugs after having used the following drugs: Marijuana.  Allergies  Allergen Reactions   Keflex  [Cephalexin ] Itching    Family History  Problem Relation Age of Onset   Diabetes Mother    Stroke Mother    Colon cancer Father 20   Cancer Father    Hypertension Maternal Grandmother  Diabetes Maternal Grandmother    Hypertension Paternal Grandmother    Esophageal cancer Neg Hx    Liver disease Neg Hx     Prior to Admission medications   Medication Sig Start Date End Date Taking? Authorizing Provider  acetaminophen  (TYLENOL ) 500 MG tablet Take 1,000 mg by mouth every 6 (six) hours as needed for moderate pain (pain score 4-6), fever or headache.   Yes [provider]  omeprazole  (PRILOSEC OTC) 20 MG  tablet Take 20 mg by mouth daily.   Yes [provider]  polyethylene glycol (MIRALAX  / GLYCOLAX ) 17 g packet Take 17 g by mouth daily. 11/02/23  Yes [place], PA-C  omeprazole  (PRILOSEC) 40 MG capsule Take 1 capsule (40 mg total) by mouth daily. Patient not taking: Reported on 12/08/2023 11/02/23   [place], PA-C    Physical Exam: Vitals:   12/08/23 1006 12/08/23 1102 12/08/23 1106 12/08/23 1537  BP: 109/71  (!) 118/57 (!) 111/58  Pulse:   60 (!) 54  Resp: 16  18   Temp:   98.5 F (36.9 C) 98.4 F (36.9 C)  TempSrc:   Oral Oral  SpO2:  98% 98% 100%  Weight:      Height:       General: Alert, oriented x3, resting comfortably in no acute distress Respiratory: Lungs clear to auscultation bilaterally with normal respiratory effort; no w/r/r Cardiovascular: Regular rate and rhythm w/o m/r/g Abdomen: Soft, and nondistended. TTP diffusely. Positive bowel sounds   Data Reviewed:  Lab Results  Component Value Date   WBC 11.4 (H) 12/08/2023   HGB 11.9 (L) 12/08/2023   HCT 36.2 12/08/2023   MCV 94.0 12/08/2023   PLT 339 12/08/2023   Lab Results  Component Value Date   GLUCOSE 146 (H) 12/08/2023   CALCIUM 9.5 12/08/2023   NA 142 12/08/2023   K 3.6 12/08/2023   CO2 24 12/08/2023   CL 106 12/08/2023   BUN 14 12/08/2023   CREATININE 0.99 12/08/2023   Lab Results  Component Value Date   ALT 9 12/08/2023   AST 16 12/08/2023   ALKPHOS 67 12/08/2023   BILITOT 0.5 12/08/2023   Lab Results  Component Value Date   INR 1.0 12/28/2022    Radiology: CT ABDOMEN PELVIS W CONTRAST Result Date: 12/08/2023 CLINICAL DATA:  Left lower quadrant pain, nausea, vomiting, diarrhea EXAM: CT ABDOMEN AND PELVIS WITH CONTRAST TECHNIQUE: Multidetector CT imaging of the abdomen and pelvis was performed using the standard protocol following bolus administration of intravenous contrast. RADIATION DOSE REDUCTION: This exam was performed according to the departmental dose-optimization  program which includes automated exposure control, adjustment of the mA and/or kV according to patient size and/or use of iterative reconstruction technique. CONTRAST:  OMNIPAQUE  IOHEXOL  300 MG/ML  SOLN COMPARISON:  10/25/2023 FINDINGS: Lower chest: No acute abnormality Hepatobiliary: Diffuse low-density throughout the liver compatible with fatty infiltration. No focal abnormality. Gallbladder unremarkable. Pancreas: No focal abnormality or ductal dilatation. Spleen: No focal abnormality.  Normal size. Adrenals/Urinary Tract: No adrenal abnormality. No focal renal abnormality. No stones or hydronephrosis. Urinary bladder is unremarkable. Stomach/Bowel: Stomach and small bowel decompressed. Colon is also decompressed but again appears thick walled suggesting colitis, similar to prior study. No bowel obstruction. Vascular/Lymphatic: No evidence of aneurysm or adenopathy. Reproductive: Prior hysterectomy.  No adnexal masses. Other: No free fluid or free air. Musculoskeletal: No acute bony abnormality. IMPRESSION: Colon is decompressed and difficult evaluate, but appears to again be diffusely thick walled suggesting colitis. Recommend  clinical correlation. Hepatic steatosis. Electronically Signed   By: Franky Crease M.D.   On: 12/08/2023 03:17   DG Chest Port 1 View Result Date: 12/08/2023 CLINICAL DATA:  Chest pain EXAM: PORTABLE CHEST 1 VIEW COMPARISON:  11/19/2023 FINDINGS: The heart size and mediastinal contours are within normal limits. Both lungs are clear. The visualized skeletal structures are unremarkable. No pneumothorax IMPRESSION: No active disease. Electronically Signed   By: Franky Crease M.D.   On: 12/08/2023 02:24    Assessment and Plan: 31F h/o cervical cancer s/p hysterectomy, duodenal ulcer, and recent admission for colitis in 10/2023 who follows with [facility] w/ plan for CSY who p/w acute LLQ pain, N/V/D, and elevated lactate and WBC as well as imaging c/w colitis.  Colitis Possible  IBD -Continue IV Zoysn for now -MIVF: NS at 100cc/h for now -F/u GIP -F/u fecal calprotectin -Consider GI consult pending abnl GIP/calprotectin above   Advance Care Planning:   Code Status: Full Code   Consults: N/A  Family Communication: Mother  Severity of Illness: The appropriate patient status for this patient is INPATIENT. Inpatient status is judged to be reasonable and necessary in order to provide the required intensity of service to ensure the patient's safety. The patient's presenting symptoms, physical exam findings, and initial radiographic and laboratory data in the context of their chronic comorbidities is felt to place them at high risk for further clinical deterioration. Furthermore, it is not anticipated that the patient will be medically stable for discharge from the hospital within 2 midnights of admission.   * I certify that at the point of admission it is my clinical judgment that the patient will require inpatient hospital care spanning beyond 2 midnights from the point of admission due to high intensity of service, high risk for further deterioration and high frequency of surveillance required.*   ------- I spent 61 minutes reviewing previous notes, at the bedside counseling/discussing the treatment plan, and performing clinical documentation.  Author: Marsha Ada, MD 12/08/2023 5:14 PM  For on call review [email or web address].

## 2023-12-08 NOTE — ED Triage Notes (Signed)
 LLQ abdominal pains that awoke her from sleep ~10pm. Pain followed with nausea, vomiting and diarrhea.

## 2023-12-08 NOTE — Progress Notes (Signed)
 Transition of Care [facility]) - Inpatient Brief Assessment   Patient Details  Name: Holly Brown MRN: [id number] Date of Birth: Jan 02, 1986  Transition [facility] CM/SW Contact:    Rosaline JONELLE Joe, RN Phone Number: 12/08/2023, 11:24 AM   Clinical Narrative: Patient admitted for colitis.  No IP Care management needs at this time.   Transition of Care Asessment: Insurance and Status: (P) Insurance coverage has been reviewed Patient has primary care physician: (P) Yes Home environment has been reviewed: (P) from home Prior level of function:: (P) self Prior/Current Home Services: (P) No current home services Social Drivers of Health Review: (P) SDOH reviewed no interventions necessary Readmission risk has been reviewed: (P) Yes Transition of care needs: (P) no transition of care needs at this time

## 2023-12-08 NOTE — Progress Notes (Signed)
 ED Pharmacy Antibiotic Sign Off An antibiotic consult was received from an ED provider for zosyn  per pharmacy dosing for IAI. A chart review was completed to assess appropriateness.   The following one time order(s) were placed:   -Zosyn  3.375gm IV x1  Further antibiotic and/or antibiotic pharmacy consults should be ordered by the admitting provider if indicated.   Thank you for allowing pharmacy to be a part of this patient's care.   Lynwood Poplar, [facility]  Clinical Pharmacist 12/08/23 6:09 AM

## 2023-12-08 NOTE — Plan of Care (Signed)

## 2023-12-09 DIAGNOSIS — R7989 Other specified abnormal findings of blood chemistry: Secondary | ICD-10-CM

## 2023-12-09 DIAGNOSIS — K529 Noninfective gastroenteritis and colitis, unspecified: Secondary | ICD-10-CM | POA: Diagnosis not present

## 2023-12-09 DIAGNOSIS — R109 Unspecified abdominal pain: Secondary | ICD-10-CM

## 2023-12-09 DIAGNOSIS — R112 Nausea with vomiting, unspecified: Secondary | ICD-10-CM

## 2023-12-09 DIAGNOSIS — D72829 Elevated white blood cell count, unspecified: Secondary | ICD-10-CM

## 2023-12-09 LAB — BASIC METABOLIC PANEL WITH GFR
Anion gap: 10 (ref 5–15)
BUN: 5 mg/dL — ABNORMAL LOW (ref 6–20)
CO2: 24 mmol/L (ref 22–32)
Calcium: 8.4 mg/dL — ABNORMAL LOW (ref 8.9–10.3)
Chloride: 103 mmol/L (ref 98–111)
Creatinine, Ser: 0.91 mg/dL (ref 0.44–1.00)
GFR, Estimated: >60 mL/min (ref >60)
Glucose, Bld: 102 mg/dL — ABNORMAL HIGH (ref 70–99)
Potassium: 3.1 mmol/L — ABNORMAL LOW (ref 3.5–5.1)
Sodium: 137 mmol/L (ref 135–145)

## 2023-12-09 LAB — CBC
HCT: 34.3 % — ABNORMAL LOW (ref 36.0–46.0)
Hemoglobin: 11.2 g/dL — ABNORMAL LOW (ref 12.0–15.0)
MCH: 30.7 pg (ref 26.0–34.0)
MCHC: 32.7 g/dL (ref 30.0–36.0)
MCV: 94 fL (ref 80.0–100.0)
Platelets: 322 K/uL (ref 150–400)
RBC: 3.65 MIL/uL — ABNORMAL LOW (ref 3.87–5.11)
RDW: 12.9 % (ref 11.5–15.5)
WBC: 8.2 K/uL (ref 4.0–10.5)
nRBC: 0 % (ref 0.0–0.2)

## 2023-12-09 MED ORDER — ACETAMINOPHEN 325 MG PO TABS: 650.0000 mg | ORAL_TABLET | Freq: Four times a day (QID) | ORAL | Status: DC | PRN

## 2023-12-09 MED ORDER — POTASSIUM CHLORIDE CRYS ER 20 MEQ PO TBCR
40.0000 meq | EXTENDED_RELEASE_TABLET | ORAL | Status: AC
  Administered 2023-12-09 (×2): 40 meq via ORAL
  Filled 2023-12-09 (×2): qty 2

## 2023-12-09 NOTE — Consult Note (Addendum)
 Consultation Note   Referring Provider:  [facility] PCP: Corwin Antu, FNP Primary Gastroenterologist:  Gordy Starch, MD Reason for Consultation: Recurrent diarrhea, possible colitis DOA: 12/08/2023         Hospital Day: 2   ASSESSMENT    Patient Profile:  38 y.o. year old female with a medical history including but not limited to peptic ulcer disease, anxiety, hepatic steatosis, cervical cancer status post hysterectomy family history of colon cancer in father.  Recurrent nausea, vomiting, crampy diarrhea. Leukocytosis / lactic acidosis / mild increase in creatinine ( probably volume depletion from GI losses). CT scan  suggesting colitis vrs decompressed colon Admitted for same presentation in early July with resolution of symptoms with course of antibiotics.  Stool studies were never obtained as diarrhea resolved.  Again diarrhea has resolved prior to stool studies being collected this admission.  Leukocytosis and lactic acidosis resolved with antibiotics and treatment of dehydration  Chronic GERD Recently repeat started on daily Prilosec  Family history of colon cancer in father Patient is scheduled for outpatient screening colonoscopy January 04, 2024   PLAN:   --Await stool studies  (if able to be collected) -- If no recurrent diarrhea then can possibly be discharged home tomorrow and proceed with outpatient colonoscopy in September as already scheduled -- Continue daily PPI for history of GERD  HPI   Patient was admitted early July of this year with nausea,, vomiting, and diarrhea.  CT scan suggested colitis.  Diarrhea stopped in the hospital before stool studies were able to be collected.  She was discharged home on antibiotics and seen in their office for hospital follow-up on 11/02/2023.  Her symptoms improved.  She was advised to complete the course of antibiotics that she was discharged home from the hospital with.   In 2018 we had attempted a colonoscopy on the patient for her family history of colon cancer.  The prep was poor.  Given that, at the time of her 11/02/2023 visit we rescheduled her for a screening colonoscopy.  Also at that visit she was having some uncontrolled reflux symptoms so we started her on daily Prilosec.  Colonoscopy scheduled for 01/04/2024  Since her visit with us  in mid July patient had been doing okay until 2 days ago when she developed recurrent crampy diarrhea, nausea and vomiting.  This started Monday night around 10 PM. Between 10 pm and 1 am on Tuesday had a bout 9 episodes of watery, non-bloody diarrhea as well as some episodes of non-bloody nausea and vomiting. She came to the ED early Tue morning for evaluation. No further diarrhea since arriving to hospital. She has had some ongoing dry heaves.    Relevant workup thus far: On arrival to the hospital her lactic acid was elevated at 2.5, WBC 13.3 .  Since admission her white count and lactic acid level has normalized.  Hemoglobin down slightly to 11.2 after IV fluids.    CT scan with contrast shows colon is decompressed but appears thick as before suggesting colitis  .  Labs and Imaging:  Recent Labs    12/08/23 0206  PROT 7.0  ALBUMIN 4.2  AST 16  ALT 9  ALKPHOS 67  BILITOT 0.5   Recent Labs    12/08/23 0206 12/08/23 1528 12/09/23 0142  WBC 13.3* 11.4* 8.2  HGB 12.7 11.9* 11.2*  HCT 38.6 36.2 34.3*  MCV 93.2 94.0 94.0  PLT 370 339 322   Recent Labs    12/08/23 0206 12/08/23 1528 12/09/23 0142  NA 142  --  137  K 3.6  --  3.1*  CL 106  --  103  CO2 24  --  24  GLUCOSE 146*  --  102*  BUN 14  --  5*  CREATININE 1.07* 0.99 0.91  CALCIUM 9.5  --  8.4*     CT ABDOMEN PELVIS W CONTRAST CLINICAL DATA:  Left lower quadrant pain, nausea, vomiting, diarrhea  EXAM: CT ABDOMEN AND PELVIS WITH CONTRAST  TECHNIQUE: Multidetector CT imaging of the abdomen and  pelvis was performed using the standard protocol following bolus administration of intravenous contrast.  RADIATION DOSE REDUCTION: This exam was performed according to the departmental dose-optimization program which includes automated exposure control, adjustment of the mA and/or kV according to patient size and/or use of iterative reconstruction technique.  CONTRAST:  OMNIPAQUE  IOHEXOL  300 MG/ML  SOLN  COMPARISON:  10/25/2023  FINDINGS: Lower chest: No acute abnormality  Hepatobiliary: Diffuse low-density throughout the liver compatible with fatty infiltration. No focal abnormality. Gallbladder unremarkable.  Pancreas: No focal abnormality or ductal dilatation.  Spleen: No focal abnormality.  Normal size.  Adrenals/Urinary Tract: No adrenal abnormality. No focal renal abnormality. No stones or hydronephrosis. Urinary bladder is unremarkable.  Stomach/Bowel: Stomach and small bowel decompressed. Colon is also decompressed but again appears thick walled suggesting colitis, similar to prior study. No bowel obstruction.  Vascular/Lymphatic: No evidence of aneurysm or adenopathy.  Reproductive: Prior hysterectomy.  No adnexal masses.  Other: No free fluid or free air.  Musculoskeletal: No acute bony abnormality.  IMPRESSION: Colon is decompressed and difficult evaluate, but appears to again be diffusely thick walled suggesting colitis. Recommend clinical correlation.  Hepatic steatosis.  Electronically Signed   By: Franky Crease M.D.   On: 12/08/2023 03:17 DG Chest Port 1 View CLINICAL DATA:  Chest pain  EXAM: PORTABLE CHEST 1 VIEW  COMPARISON:  11/19/2023  FINDINGS: The heart size and mediastinal contours are within normal limits. Both lungs are clear. The visualized skeletal structures are unremarkable. No pneumothorax  IMPRESSION: No active disease.  Electronically Signed   By: Franky Crease M.D.   On: 12/08/2023 02:24   Past Medical  History:  Diagnosis Date   Anxiety    Depression    Fatty liver    Gestational diabetes    metformin  - resolved after delivery   Hiatal hernia    History of UTI    HSV (herpes simplex virus) infection    Migraines    Peripheral vascular disease (HCC)    Post-operative nausea and vomiting 12/19/2022   Seizures (HCC)    Jan.2016 - only had one seizure, none since   Sleep apnea    Vaginal Pap smear, abnormal     Past Surgical History:  Procedure Laterality Date   ABDOMINAL HYSTERECTOMY  12/16/2022   BIOPSY  12/24/2022   Procedure: BIOPSY;  Surgeon: Leigh Elspeth SQUIBB, MD;  Location: [facility];  Service: Gastroenterology;;   DILATION AND CURETTAGE OF UTERUS     ESOPHAGOGASTRODUODENOSCOPY (EGD) WITH PROPOFOL  N/A  12/24/2022   Procedure: ESOPHAGOGASTRODUODENOSCOPY (EGD) WITH PROPOFOL ;  Surgeon: Leigh Elspeth SQUIBB, MD;  Location: [facility] ENDOSCOPY;  Service: Gastroenterology;  Laterality: N/A;   LAPAROSCOPIC TUBAL LIGATION Bilateral 12/08/2018   Procedure: LAPAROSCOPIC TUBAL LIGATION;  Surgeon: Alger Gong, MD;  Location: [facility];  Service: Gynecology;  Laterality: Bilateral;   LEEP N/A 03/18/2022   Procedure: LOOP ELECTROSURGICAL EXCISION PROCEDURE (LEEP);  Surgeon: Alger Gong, MD;  Location: [facility] OR;  Service: Gynecology;  Laterality: N/A;   TUBAL LIGATION     VAGINAL HYSTERECTOMY N/A 12/16/2022   Procedure: TOTAL VAGINAL HYSTERECTOMY, BILATERAL SALPINGECTOMY;  Surgeon: Lorence Ozell CROME, MD;  Location: [facility] OR;  Service: Gynecology;  Laterality: N/A;   WISDOM TOOTH EXTRACTION  04/21/2009    Family History  Problem Relation Age of Onset   Diabetes Mother    Stroke Mother    Colon cancer Father 51   Cancer Father    Hypertension Maternal Grandmother    Diabetes Maternal Grandmother    Hypertension Paternal Grandmother    Esophageal cancer Neg Hx    Liver disease Neg Hx     Prior to Admission medications   Medication Sig Start Date End Date Taking?  Authorizing Provider  acetaminophen  (TYLENOL ) 500 MG tablet Take 1,000 mg by mouth every 6 (six) hours as needed for moderate pain (pain score 4-6), fever or headache.   Yes [provider]  omeprazole  (PRILOSEC OTC) 20 MG tablet Take 20 mg by mouth daily.   Yes [provider]  polyethylene glycol (MIRALAX  / GLYCOLAX ) 17 g packet Take 17 g by mouth daily. 11/02/23  Yes [place], PA-C  omeprazole  (PRILOSEC) 40 MG capsule Take 1 capsule (40 mg total) by mouth daily. Patient not taking: Reported on 12/08/2023 11/02/23   [place], PA-C    Current Facility-Administered Medications  Medication Dose Route Frequency Provider Last Rate Last Admin   acetaminophen  (TYLENOL ) tablet 650 mg  650 mg Oral Q6H PRN Devaughn Savant C, MD       enoxaparin  (LOVENOX ) injection 40 mg  40 mg Subcutaneous Q24H Georgina Basket, MD       HYDROmorphone  (DILAUDID ) injection 1 mg  1 mg Intravenous Q4H PRN Moore, Willie, MD   1 mg at 12/09/23 9172   Oral care mouth rinse  15 mL Mouth Rinse PRN Georgina Basket, MD       pantoprazole  (PROTONIX ) EC tablet 40 mg  40 mg Oral Daily Georgina Basket, MD   40 mg at 12/09/23 9081   piperacillin -tazobactam (ZOSYN ) IVPB 3.375 g  3.375 g Intravenous Q8H Hammons, Suzen NOVAK, RPH 12.5 mL/hr at 12/09/23 0516 3.375 g at 12/09/23 0516   potassium chloride  SA (KLOR-CON  M) CR tablet 40 mEq  40 mEq Oral Q4H Cheryle Page, MD   40 mEq at 12/09/23 1051    Allergies as of 12/08/2023 - Review Complete 12/08/2023  Allergen Reaction Noted   Keflex  [cephalexin ] Itching 09/08/2023    Social History   Socioeconomic History   Marital status: Single    Spouse name: Not on file   Number of children: 4   Years of education: college   Highest education level: Some college, no degree  Occupational History    Employer: [employer]    Comment: Tax inspector  Tobacco Use   Smoking status: Former    Current packs/day: 0.25    Average packs/day: 0.3 packs/day for 12.0 years  (3.0 ttl pk-yrs)    Types: Cigarettes    Start date: 12/21/2011  Passive exposure: Never   Smokeless tobacco: Never  Vaping Use   Vaping status: Never Used  Substance and Sexual Activity   Alcohol use: Yes    Comment: ocassionally    Drug use: Not Currently    Types: Marijuana    Comment: Last use was in 2023   Sexual activity: Not Currently    Partners: Male    Birth control/protection: Surgical    Comment: Tubal Ligation  Other Topics Concern   Not on file  Social History Narrative   Lives with her grandmother and her three children   Does not drink caffeine    Social Drivers of Corporate investment banker Strain: Low Risk  (10/16/2023)   Overall Financial Resource Strain (CARDIA)    Difficulty of Paying Living Expenses: Not hard at all  Food Insecurity: No Food Insecurity (12/08/2023)   Hunger Vital Sign    Worried About Running Out of Food in the Last Year: Never true    Ran Out of Food in the Last Year: Never true  Transportation Needs: No Transportation Needs (12/08/2023)   PRAPARE - Administrator, Civil Service (Medical): No    Lack of Transportation (Non-Medical): No  Physical Activity: Sufficiently Active (10/16/2023)   Exercise Vital Sign    Days of Exercise per Week: 4 days    Minutes of Exercise per Session: 120 min  Stress: Stress Concern Present (10/16/2023)   [employer] of Occupational Health - Occupational Stress Questionnaire    Feeling of Stress: To some extent  Social Connections: Moderately Isolated (10/16/2023)   Social Connection and Isolation Panel    Frequency of Communication with Friends and Family: More than three times a week    Frequency of Social Gatherings with Friends and Family: Patient declined    Attends Religious Services: 1 to 4 times per year    Active Member of [employer] or Organizations: No    Attends Engineer, structural: Not on file    Marital Status: Never married  Intimate Partner Violence: Not At Risk  (12/08/2023)   Humiliation, Afraid, Rape, and Kick questionnaire    Fear of Current or Ex-Partner: No    Emotionally Abused: No    Physically Abused: No    Sexually Abused: No     Code Status   Code Status: Full Code  Review of Systems: All systems reviewed and negative except where noted in HPI.  Physical Exam: Vital signs in last 24 hours: Temp:  [98.1 F (36.7 C)-98.5 F (36.9 C)] 98.5 F (36.9 C) (08/20 0747) Pulse Rate:  [54-70] 70 (08/20 0747) Resp:  [17-18] 17 (08/20 0518) BP: (101-111)/(57-58) 110/58 (08/20 0747) SpO2:  [100 %] 100 % (08/20 0747) Last BM Date : 12/08/23  General:  Pleasant female in NAD Psych:  Cooperative. Normal mood and affect Eyes: Pupils equal Ears:  Normal auditory acuity Nose: No deformity, discharge or lesions Neck:  Supple, no masses felt Lungs:  Clear to auscultation.  Heart:  Regular rate, regular rhythm.  Abdomen:  Soft, nondistended, nontender, active bowel sounds, no masses felt Rectal :  Deferred Msk: Symmetrical without gross deformities.  Neurologic:  Alert, oriented, grossly normal neurologically Extremities : No edema Skin:  Intact without significant lesions.    Intake/Output from previous day: 08/19 0701 - 08/20 0700 In: 1576.7 [I.V.:494; IV Piggyback:1082.6] Out: -  Intake/Output this shift:  No intake/output data recorded.   Vina Dasen, NP-C   12/09/2023, 1:23 PM  I personally saw the  patient and performed a substantive portion of this encounter (>50% time spent), including a complete performance of at least one of the key components (MDM, Hx and/or Exam), in conjunction with the APP.  I agree with the APP's note, impression, and recommendations with additional input as follows.   38 year old female with history of PUD, fatty liver, cervical cancer status post hysterectomy, family history of colon cancer in father, and anxiety presented with nausea, vomiting, abdominal pain, and diarrhea.  Labs were notable for  a mild leukocytosis and a mildly elevated creatinine.  CT A/P shows a decompressed colon with possible diffusely thickened wall suggesting colitis.  Since she has gone to the hospital, her diarrhea and abdominal pain have improved.  She has required several doses of IV narcotics, which is likely slowing down her diarrhea.  Will attempt to collect infectious stool studies as well as a fecal calprotectin lab.  If infectious stool studies are negative, then could potentially plan for to do her colonoscopy as an inpatient versus patient is already scheduled for an outpatient colonoscopy in September.  Patient was placed on Zosyn  upon arrival.   Estefana Kidney, MD

## 2023-12-09 NOTE — Plan of Care (Signed)

## 2023-12-09 NOTE — Progress Notes (Signed)
 PROGRESS NOTE    Holly Brown  FMW:[id number] DOB: 08/08/1985 DOA: 12/08/2023 PCP: Corwin Antu, FNP   Brief Narrative:  38 year old female with history of cervical cancer status post hysterectomy, duodenal ulcer, recent admission in 10/2023 with colitis treated with antibiotics with plans for colonoscopy by [facility] as an outpatient presented with abdominal pain, nausea, vomiting and diarrhea.  On presentation, there is mild leukocytosis with lactic acidosis with imaging concerning for colitis.  She was started on IV antibiotics.  Assessment & Plan:   Colitis -Patient with recent admission in 7/125 with colitis and treated with antibiotics with plans for colonoscopy by [facility] as an outpatient  - Presented again with findings concerning for colitis.  Currently on IV Zosyn .  No diarrhea since admission: Stool studies pending. - Will consult GI.  Leukocytosis - Resolved  Hypokalemia -Replace.  Repeat a.m. labs  Lactic acidosis--resolved  Obesity class I -Outpatient follow-up   DVT prophylaxis: Lovenox  Code Status: Full Family Communication: None at bedside Disposition Plan: Status is: Inpatient Remains inpatient appropriate because: Of severity of illness    Consultants: Consult GI  Procedures: None  Antimicrobials: Zosyn  from 12/08/2023   Subjective: Patient seen and examined at bedside.  Continues to have intermittent abdominal pain.  No diarrhea since admission.  No fever or chest pain reported.  Objective: Vitals:   12/08/23 1537 12/08/23 2046 12/08/23 2351 12/09/23 0518  BP: (!) 111/58 (!) 104/58 (!) 103/58 (!) 101/57  Pulse: (!) 54 (!) 57 (!) 56 61  Resp:  18 18 17   Temp: 98.4 F (36.9 C) 98.4 F (36.9 C) 98.1 F (36.7 C) 98.5 F (36.9 C)  TempSrc: Oral Oral Oral Oral  SpO2: 100% 100% 100% 100%  Weight:      Height:        Intake/Output Summary (Last 24 hours) at 12/09/2023 0746 Last data filed at 12/08/2023 1606 Gross per 24 hour  Intake  534.5 ml  Output --  Net 534.5 ml   Filed Weights   12/08/23 0157  Weight: 106.6 kg    Examination:  General exam: Appears calm and comfortable  Respiratory system: Bilateral decreased breath sounds at bases, no wheezing Cardiovascular system: S1 & S2 heard, Rate controlled Gastrointestinal system: Abdomen is obese, nondistended, soft and mildly tender in the lower quadrant.  Normal bowel sounds heard. Extremities: No cyanosis, clubbing, edema  Central nervous system: Alert and oriented. No focal neurological deficits. Moving extremities Skin: No rashes, lesions or ulcers Psychiatry: Judgement and insight appear normal. Mood & affect appropriate.     Data Reviewed: I have personally reviewed following labs and imaging studies  CBC: Recent Labs  Lab 12/08/23 0206 12/08/23 1528 12/09/23 0142  WBC 13.3* 11.4* 8.2  NEUTROABS 9.8*  --   --   HGB 12.7 11.9* 11.2*  HCT 38.6 36.2 34.3*  MCV 93.2 94.0 94.0  PLT 370 339 322   Basic Metabolic Panel: Recent Labs  Lab 12/08/23 0206 12/08/23 1528 12/09/23 0142  NA 142  --  137  K 3.6  --  3.1*  CL 106  --  103  CO2 24  --  24  GLUCOSE 146*  --  102*  BUN 14  --  5*  CREATININE 1.07* 0.99 0.91  CALCIUM 9.5  --  8.4*   GFR: Estimated Creatinine Clearance: 114 mL/min (by C-G formula based on SCr of 0.91 mg/dL). Liver Function Tests: Recent Labs  Lab 12/08/23 0206  AST 16  ALT 9  ALKPHOS 67  BILITOT 0.5  PROT 7.0  ALBUMIN 4.2   Recent Labs  Lab 12/08/23 0206  LIPASE 17   No results for input(s): AMMONIA in the last 168 hours. Coagulation Profile: No results for input(s): INR, PROTIME in the last 168 hours. Cardiac Enzymes: No results for input(s): CKTOTAL, CKMB, CKMBINDEX, TROPONINI in the last 168 hours. BNP (last 3 results) No results for input(s): PROBNP in the last 8760 hours. HbA1C: No results for input(s): HGBA1C in the last 72 hours. CBG: No results for input(s): GLUCAP in the  last 168 hours. Lipid Profile: No results for input(s): CHOL, HDL, LDLCALC, TRIG, CHOLHDL, LDLDIRECT in the last 72 hours. Thyroid Function Tests: No results for input(s): TSH, T4TOTAL, FREET4, T3FREE, THYROIDAB in the last 72 hours. Anemia Panel: No results for input(s): VITAMINB12, FOLATE, FERRITIN, TIBC, IRON, RETICCTPCT in the last 72 hours. Sepsis Labs: Recent Labs  Lab 12/08/23 0206 12/08/23 0413 12/08/23 0727 12/08/23 1528  PROCALCITON  --   --   --  <0.10  LATICACIDVEN 2.8* 2.2* 1.5  --     No results found for this or any previous visit (from the past 240 hours).       Radiology Studies: CT ABDOMEN PELVIS W CONTRAST Result Date: 12/08/2023 CLINICAL DATA:  Left lower quadrant pain, nausea, vomiting, diarrhea EXAM: CT ABDOMEN AND PELVIS WITH CONTRAST TECHNIQUE: Multidetector CT imaging of the abdomen and pelvis was performed using the standard protocol following bolus administration of intravenous contrast. RADIATION DOSE REDUCTION: This exam was performed according to the departmental dose-optimization program which includes automated exposure control, adjustment of the mA and/or kV according to patient size and/or use of iterative reconstruction technique. CONTRAST:  OMNIPAQUE  IOHEXOL  300 MG/ML  SOLN COMPARISON:  10/25/2023 FINDINGS: Lower chest: No acute abnormality Hepatobiliary: Diffuse low-density throughout the liver compatible with fatty infiltration. No focal abnormality. Gallbladder unremarkable. Pancreas: No focal abnormality or ductal dilatation. Spleen: No focal abnormality.  Normal size. Adrenals/Urinary Tract: No adrenal abnormality. No focal renal abnormality. No stones or hydronephrosis. Urinary bladder is unremarkable. Stomach/Bowel: Stomach and small bowel decompressed. Colon is also decompressed but again appears thick walled suggesting colitis, similar to prior study. No bowel obstruction. Vascular/Lymphatic: No evidence of  aneurysm or adenopathy. Reproductive: Prior hysterectomy.  No adnexal masses. Other: No free fluid or free air. Musculoskeletal: No acute bony abnormality. IMPRESSION: Colon is decompressed and difficult evaluate, but appears to again be diffusely thick walled suggesting colitis. Recommend clinical correlation. Hepatic steatosis. Electronically Signed   By: Franky Crease M.D.   On: 12/08/2023 03:17   DG Chest Port 1 View Result Date: 12/08/2023 CLINICAL DATA:  Chest pain EXAM: PORTABLE CHEST 1 VIEW COMPARISON:  11/19/2023 FINDINGS: The heart size and mediastinal contours are within normal limits. Both lungs are clear. The visualized skeletal structures are unremarkable. No pneumothorax IMPRESSION: No active disease. Electronically Signed   By: Franky Crease M.D.   On: 12/08/2023 02:24        Scheduled Meds:  enoxaparin  (LOVENOX ) injection  40 mg Subcutaneous Q24H   pantoprazole   40 mg Oral Daily   polyethylene glycol  17 g Oral Daily   Continuous Infusions:  sodium chloride      piperacillin -tazobactam (ZOSYN )  IV 3.375 g (12/09/23 0516)          Sophie Mao, MD [facility] 12/09/2023, 7:46 AM

## 2023-12-10 DIAGNOSIS — K529 Noninfective gastroenteritis and colitis, unspecified: Secondary | ICD-10-CM | POA: Diagnosis not present

## 2023-12-10 LAB — CBC WITH DIFFERENTIAL/PLATELET
Abs Immature Granulocytes: 0.02 K/uL (ref 0.00–0.07)
Basophils Absolute: 0 K/uL (ref 0.0–0.1)
Basophils Relative: 1 %
Eosinophils Absolute: 0.2 K/uL (ref 0.0–0.5)
Eosinophils Relative: 2 %
HCT: 35.4 % — ABNORMAL LOW (ref 36.0–46.0)
Hemoglobin: 11.6 g/dL — ABNORMAL LOW (ref 12.0–15.0)
Immature Granulocytes: 0 %
Lymphocytes Relative: 32 %
Lymphs Abs: 2.6 K/uL (ref 0.7–4.0)
MCH: 31 pg (ref 26.0–34.0)
MCHC: 32.8 g/dL (ref 30.0–36.0)
MCV: 94.7 fL (ref 80.0–100.0)
Monocytes Absolute: 0.6 K/uL (ref 0.1–1.0)
Monocytes Relative: 8 %
Neutro Abs: 4.7 K/uL (ref 1.7–7.7)
Neutrophils Relative %: 57 %
Platelets: 331 K/uL (ref 150–400)
RBC: 3.74 MIL/uL — ABNORMAL LOW (ref 3.87–5.11)
RDW: 12.7 % (ref 11.5–15.5)
WBC: 8.2 K/uL (ref 4.0–10.5)
nRBC: 0 % (ref 0.0–0.2)

## 2023-12-10 LAB — BASIC METABOLIC PANEL WITH GFR
Anion gap: 14 (ref 5–15)
BUN: <5 mg/dL — ABNORMAL LOW (ref 6–20)
CO2: 27 mmol/L (ref 22–32)
Calcium: 8.4 mg/dL — ABNORMAL LOW (ref 8.9–10.3)
Chloride: 99 mmol/L (ref 98–111)
Creatinine, Ser: 1.02 mg/dL — ABNORMAL HIGH (ref 0.44–1.00)
GFR, Estimated: >60 mL/min (ref >60)
Glucose, Bld: 93 mg/dL (ref 70–99)
Potassium: 3.2 mmol/L — ABNORMAL LOW (ref 3.5–5.1)
Sodium: 140 mmol/L (ref 135–145)

## 2023-12-10 LAB — URINE CULTURE: Culture: >=100000 — AB

## 2023-12-10 LAB — MAGNESIUM: Magnesium: 1.9 mg/dL (ref 1.7–2.4)

## 2023-12-10 LAB — C-REACTIVE PROTEIN: CRP: 0.5 mg/dL (ref <1.0)

## 2023-12-10 MED ORDER — POLYETHYLENE GLYCOL 3350 17 G PO PACK
17.0000 g | PACK | Freq: Every day | ORAL | Status: DC
  Administered 2023-12-10: 17 g via ORAL
  Filled 2023-12-10: qty 1

## 2023-12-10 MED ORDER — OXYCODONE HCL 5 MG PO TABS
5.0000 mg | ORAL_TABLET | ORAL | Status: DC | PRN
  Administered 2023-12-10 – 2023-12-13 (×3): 5 mg via ORAL
  Filled 2023-12-10 (×3): qty 1

## 2023-12-10 MED ORDER — ONDANSETRON HCL 4 MG/2ML IJ SOLN
4.0000 mg | Freq: Four times a day (QID) | INTRAMUSCULAR | Status: DC | PRN
  Administered 2023-12-10 – 2023-12-13 (×2): 4 mg via INTRAVENOUS
  Filled 2023-12-10 (×2): qty 2

## 2023-12-10 MED ORDER — ACETAMINOPHEN 325 MG PO TABS: 650.0000 mg | ORAL_TABLET | Freq: Four times a day (QID) | ORAL | Status: DC | PRN

## 2023-12-10 MED ORDER — POTASSIUM CHLORIDE CRYS ER 20 MEQ PO TBCR
40.0000 meq | EXTENDED_RELEASE_TABLET | ORAL | Status: AC
  Administered 2023-12-10 (×2): 40 meq via ORAL
  Filled 2023-12-10 (×2): qty 2

## 2023-12-10 NOTE — Plan of Care (Signed)

## 2023-12-10 NOTE — Plan of Care (Signed)

## 2023-12-10 NOTE — Progress Notes (Signed)
    Gastroenterology Inpatient Follow Up    Subjective: She has not had a BM since admission. Had some Miralax  this morning and feels like things are moving  Objective: Vital signs in last 24 hours: Temp:  [97.7 F (36.5 C)-98.9 F (37.2 C)] 97.7 F (36.5 C) (08/21 0041) Pulse Rate:  [57-72] 63 (08/21 0840) Resp:  [20] 20 (08/20 2032) BP: (108-119)/(66-77) 110/76 (08/21 0840) SpO2:  [96 %-100 %] 96 % (08/21 0840) Last BM Date : 12/08/23  Intake/Output from previous day: No intake/output data recorded. Intake/Output this shift: No intake/output data recorded.  General appearance: alert and cooperative Resp: no increased WOB Cardio: regular rate GI: non-tender, non-distended  Lab Results: Recent Labs    12/08/23 1528 12/09/23 0142 12/10/23 0148  WBC 11.4* 8.2 8.2  HGB 11.9* 11.2* 11.6*  HCT 36.2 34.3* 35.4*  PLT 339 322 331   BMET Recent Labs    12/08/23 0206 12/08/23 1528 12/09/23 0142 12/10/23 0148  NA 142  --  137 140  K 3.6  --  3.1* 3.2*  CL 106  --  103 99  CO2 24  --  24 27  GLUCOSE 146*  --  102* 93  BUN 14  --  5* <5*  CREATININE 1.07* 0.99 0.91 1.02*  CALCIUM 9.5  --  8.4* 8.4*   LFT Recent Labs    12/08/23 0206  PROT 7.0  ALBUMIN 4.2  AST 16  ALT 9  ALKPHOS 67  BILITOT 0.5   PT/INR No results for input(s): LABPROT, INR in the last 72 hours. Hepatitis Panel No results for input(s): HEPBSAG, HCVAB, HEPAIGM, HEPBIGM in the last 72 hours. C-Diff No results for input(s): CDIFFTOX in the last 72 hours.  Studies/Results: No results found.  Medications: I have reviewed the patient's current medications. Scheduled:  enoxaparin  (LOVENOX ) injection  40 mg Subcutaneous Q24H   pantoprazole   40 mg Oral Daily   polyethylene glycol  17 g Oral Daily   Continuous:  piperacillin -tazobactam (ZOSYN )  IV 3.375 g (12/10/23 0507)   PRN:acetaminophen , HYDROmorphone  (DILAUDID ) injection, ondansetron  (ZOFRAN ) IV, mouth rinse,  oxyCODONE   Assessment/Plan: 38 year old female with history of PUD, fatty liver, cervical cancer status post hysterectomy, family history of colon cancer in father, and anxiety presented with nausea, vomiting, abdominal pain, and diarrhea.  Labs were notable for a mild leukocytosis and a mildly elevated creatinine.  CT A/P shows a decompressed colon with possible diffusely thickened wall suggesting colitis. Diarrhea and ab pain have improved since she was admitted. She has not been able to have any stools. This would seem to favor an infectious etiology.   - Follow up infectious stool studies and fecal calprotectin - Can most likely plan for outpatient colonoscopy in 12/2023 since patient's diarrhea has improved   LOS: 2 days   Rosario JAYSON Kidney 12/10/2023, 2:42 PM

## 2023-12-10 NOTE — Progress Notes (Addendum)
 PROGRESS NOTE    Holly Brown  FMW:[id number] DOB: 03-22-1986 DOA: 12/08/2023 PCP: Corwin Antu, FNP   Brief Narrative:  38 year old female with history of cervical cancer status post hysterectomy, duodenal ulcer, recent admission in 10/2023 with colitis treated with antibiotics with plans for colonoscopy by [facility] as an outpatient presented with abdominal pain, nausea, vomiting and diarrhea.  On presentation, there is mild leukocytosis with lactic acidosis with imaging concerning for colitis.  She was started on IV antibiotics. GI was consulted  Assessment & Plan:   Colitis -Patient with recent admission in 7/125 with colitis and treated with antibiotics with plans for colonoscopy by [facility] as an outpatient  - Presented again with findings concerning for colitis.  Currently on IV Zosyn .  No diarrhea since admission: Stool studies pending. - GI following -still in significant pain  E coli UTI: present on admission -antibiotics plan as above  Leukocytosis - Resolved  Hypokalemia -Replace.  Repeat a.m. labs  Lactic acidosis--resolved  Obesity class I -Outpatient follow-up   DVT prophylaxis: Lovenox  Code Status: Full Family Communication: None at bedside Disposition Plan: Status is: Inpatient Remains inpatient appropriate because: Of severity of illness    Consultants: GI  Procedures: None  Antimicrobials: Zosyn  from 12/08/2023   Subjective: Patient seen and examined at bedside.  Requesting MiraLAX .  Has not had a bowel movement since admission.  Still complains of intermittent abdominal pain.  No fever, chest pain or shortness of breath reported.  Objective: Vitals:   12/09/23 1649 12/09/23 2032 12/10/23 0041 12/10/23 0840  BP: 110/77 108/66 119/70 110/76  Pulse: 72 66 (!) 57 63  Resp:  20    Temp: 98.5 F (36.9 C) 98.9 F (37.2 C) 97.7 F (36.5 C)   TempSrc: Oral Oral Oral   SpO2: 100% 100% 100% 96%  Weight:      Height:       No intake or  output data in the 24 hours ending 12/10/23 0855  Filed Weights   12/08/23 0157  Weight: 106.6 kg    Examination:  General: On room air.  No distress ENT/neck: No thyromegaly.  JVD is not elevated  respiratory: Decreased breath sounds at bases bilaterally with some crackles; no wheezing  CVS: S1-S2 heard, rate controlled currently Abdominal: Soft, obese, tender in the lower quadrant, slightly distended; no organomegaly, bowel sounds are heard Extremities: No lower extremity edema or clubbing  CNS: Awake and alert.  No focal neurologic deficit.  Moves extremities Lymph: No obvious lymphadenopathy Skin: No obvious ecchymosis/lesions  psych: Mostly flat affect.  Currently not agitated.   Musculoskeletal: No obvious joint swelling/deformity     Data Reviewed: I have personally reviewed following labs and imaging studies  CBC: Recent Labs  Lab 12/08/23 0206 12/08/23 1528 12/09/23 0142 12/10/23 0148  WBC 13.3* 11.4* 8.2 8.2  NEUTROABS 9.8*  --   --  4.7  HGB 12.7 11.9* 11.2* 11.6*  HCT 38.6 36.2 34.3* 35.4*  MCV 93.2 94.0 94.0 94.7  PLT 370 339 322 331   Basic Metabolic Panel: Recent Labs  Lab 12/08/23 0206 12/08/23 1528 12/09/23 0142 12/10/23 0148  NA 142  --  137 140  K 3.6  --  3.1* 3.2*  CL 106  --  103 99  CO2 24  --  24 27  GLUCOSE 146*  --  102* 93  BUN 14  --  5* <5*  CREATININE 1.07* 0.99 0.91 1.02*  CALCIUM 9.5  --  8.4* 8.4*  MG  --   --   --  1.9   GFR: Estimated Creatinine Clearance: 101.7 mL/min (A) (by C-G formula based on SCr of 1.02 mg/dL (H)). Liver Function Tests: Recent Labs  Lab 12/08/23 0206  AST 16  ALT 9  ALKPHOS 67  BILITOT 0.5  PROT 7.0  ALBUMIN 4.2   Recent Labs  Lab 12/08/23 0206  LIPASE 17   No results for input(s): AMMONIA in the last 168 hours. Coagulation Profile: No results for input(s): INR, PROTIME in the last 168 hours. Cardiac Enzymes: No results for input(s): CKTOTAL, CKMB, CKMBINDEX,  TROPONINI in the last 168 hours. BNP (last 3 results) No results for input(s): PROBNP in the last 8760 hours. HbA1C: No results for input(s): HGBA1C in the last 72 hours. CBG: No results for input(s): GLUCAP in the last 168 hours. Lipid Profile: No results for input(s): CHOL, HDL, LDLCALC, TRIG, CHOLHDL, LDLDIRECT in the last 72 hours. Thyroid Function Tests: No results for input(s): TSH, T4TOTAL, FREET4, T3FREE, THYROIDAB in the last 72 hours. Anemia Panel: No results for input(s): VITAMINB12, FOLATE, FERRITIN, TIBC, IRON, RETICCTPCT in the last 72 hours. Sepsis Labs: Recent Labs  Lab 12/08/23 0206 12/08/23 0413 12/08/23 0727 12/08/23 1528  PROCALCITON  --   --   --  <0.10  LATICACIDVEN 2.8* 2.2* 1.5  --     Recent Results (from the past 240 hours)  Urine Culture     Status: Abnormal   Collection Time: 12/08/23  2:24 AM   Specimen: Urine, Clean Catch  Result Value Ref Range Status   Specimen Description   Final    URINE, CLEAN CATCH Performed at [facility], [address] [place], [place] [address]    Special Requests   Final    NONE Performed at [facility], [address] [place], [place] [address]    Culture >=100,000 COLONIES/mL ESCHERICHIA COLI (A)  Final   Report Status 12/10/2023 FINAL  Final   Organism ID, Bacteria ESCHERICHIA COLI (A)  Final      Susceptibility   Escherichia coli - MIC*    AMPICILLIN <=2 SENSITIVE Sensitive     CEFAZOLIN  (URINE) Value in next row Sensitive      <=1 SENSITIVEThis is a modified FDA-approved test that has been validated and its performance characteristics determined by the reporting laboratory.  This laboratory is certified under the Clinical Laboratory Improvement Amendments CLIA as qualified to perform high complexity clinical laboratory testing.    CEFEPIME Value in next row Sensitive      <=1 SENSITIVEThis is a modified FDA-approved test that has been  validated and its performance characteristics determined by the reporting laboratory.  This laboratory is certified under the Clinical Laboratory Improvement Amendments CLIA as qualified to perform high complexity clinical laboratory testing.    ERTAPENEM Value in next row Sensitive      <=1 SENSITIVEThis is a modified FDA-approved test that has been validated and its performance characteristics determined by the reporting laboratory.  This laboratory is certified under the Clinical Laboratory Improvement Amendments CLIA as qualified to perform high complexity clinical laboratory testing.    CEFTRIAXONE  Value in next row Sensitive      <=1 SENSITIVEThis is a modified FDA-approved test that has been validated and its performance characteristics determined by the reporting laboratory.  This laboratory is certified under the Clinical Laboratory Improvement Amendments CLIA as qualified to perform high complexity clinical laboratory testing.    CIPROFLOXACIN  Value in next row Sensitive      <=1 SENSITIVEThis is  a modified FDA-approved test that has been validated and its performance characteristics determined by the reporting laboratory.  This laboratory is certified under the Clinical Laboratory Improvement Amendments CLIA as qualified to perform high complexity clinical laboratory testing.    GENTAMICIN Value in next row Sensitive      <=1 SENSITIVEThis is a modified FDA-approved test that has been validated and its performance characteristics determined by the reporting laboratory.  This laboratory is certified under the Clinical Laboratory Improvement Amendments CLIA as qualified to perform high complexity clinical laboratory testing.    NITROFURANTOIN  Value in next row Sensitive      <=1 SENSITIVEThis is a modified FDA-approved test that has been validated and its performance characteristics determined by the reporting laboratory.  This laboratory is certified under the Clinical Laboratory Improvement  Amendments CLIA as qualified to perform high complexity clinical laboratory testing.    TRIMETH /SULFA  Value in next row Sensitive      <=1 SENSITIVEThis is a modified FDA-approved test that has been validated and its performance characteristics determined by the reporting laboratory.  This laboratory is certified under the Clinical Laboratory Improvement Amendments CLIA as qualified to perform high complexity clinical laboratory testing.    AMPICILLIN/SULBACTAM Value in next row Sensitive      <=1 SENSITIVEThis is a modified FDA-approved test that has been validated and its performance characteristics determined by the reporting laboratory.  This laboratory is certified under the Clinical Laboratory Improvement Amendments CLIA as qualified to perform high complexity clinical laboratory testing.    PIP/TAZO Value in next row Sensitive ug/mL     <=4 SENSITIVEThis is a modified FDA-approved test that has been validated and its performance characteristics determined by the reporting laboratory.  This laboratory is certified under the Clinical Laboratory Improvement Amendments CLIA as qualified to perform high complexity clinical laboratory testing.    MEROPENEM Value in next row Sensitive      <=4 SENSITIVEThis is a modified FDA-approved test that has been validated and its performance characteristics determined by the reporting laboratory.  This laboratory is certified under the Clinical Laboratory Improvement Amendments CLIA as qualified to perform high complexity clinical laboratory testing.    * >=100,000 COLONIES/mL ESCHERICHIA COLI         Radiology Studies: No results found.       Scheduled Meds:  enoxaparin  (LOVENOX ) injection  40 mg Subcutaneous Q24H   pantoprazole   40 mg Oral Daily   polyethylene glycol  17 g Oral Daily   potassium chloride   40 mEq Oral Q4H   Continuous Infusions:  piperacillin -tazobactam (ZOSYN )  IV 3.375 g (12/10/23 0507)          Sophie Mao,  MD [facility] 12/10/2023, 8:55 AM

## 2023-12-11 DIAGNOSIS — K529 Noninfective gastroenteritis and colitis, unspecified: Secondary | ICD-10-CM | POA: Diagnosis not present

## 2023-12-11 LAB — GASTROINTESTINAL PANEL BY PCR, STOOL (REPLACES STOOL CULTURE)

## 2023-12-11 LAB — COMPREHENSIVE METABOLIC PANEL WITH GFR
ALT: 38 U/L (ref 0–44)
AST: 34 U/L (ref 15–41)
Albumin: 2.9 g/dL — ABNORMAL LOW (ref 3.5–5.0)
Alkaline Phosphatase: 47 U/L (ref 38–126)
Anion gap: 12 (ref 5–15)
BUN: <5 mg/dL — ABNORMAL LOW (ref 6–20)
CO2: 23 mmol/L (ref 22–32)
Calcium: 8.4 mg/dL — ABNORMAL LOW (ref 8.9–10.3)
Chloride: 104 mmol/L (ref 98–111)
Creatinine, Ser: 1.06 mg/dL — ABNORMAL HIGH (ref 0.44–1.00)
GFR, Estimated: >60 mL/min (ref >60)
Glucose, Bld: 96 mg/dL (ref 70–99)
Potassium: 3.5 mmol/L (ref 3.5–5.1)
Sodium: 139 mmol/L (ref 135–145)
Total Bilirubin: 1.2 mg/dL (ref 0.0–1.2)
Total Protein: 5.6 g/dL — ABNORMAL LOW (ref 6.5–8.1)

## 2023-12-11 LAB — CBC WITH DIFFERENTIAL/PLATELET
Abs Immature Granulocytes: 0.02 K/uL (ref 0.00–0.07)
Basophils Absolute: 0.1 K/uL (ref 0.0–0.1)
Basophils Relative: 1 %
Eosinophils Absolute: 0.2 K/uL (ref 0.0–0.5)
Eosinophils Relative: 3 %
HCT: 34.5 % — ABNORMAL LOW (ref 36.0–46.0)
Hemoglobin: 11.6 g/dL — ABNORMAL LOW (ref 12.0–15.0)
Immature Granulocytes: 0 %
Lymphocytes Relative: 35 %
Lymphs Abs: 2.6 K/uL (ref 0.7–4.0)
MCH: 31.1 pg (ref 26.0–34.0)
MCHC: 33.6 g/dL (ref 30.0–36.0)
MCV: 92.5 fL (ref 80.0–100.0)
Monocytes Absolute: 0.6 K/uL (ref 0.1–1.0)
Monocytes Relative: 8 %
Neutro Abs: 3.9 K/uL (ref 1.7–7.7)
Neutrophils Relative %: 53 %
Platelets: 287 K/uL (ref 150–400)
RBC: 3.73 MIL/uL — ABNORMAL LOW (ref 3.87–5.11)
RDW: 12.8 % (ref 11.5–15.5)
WBC: 7.3 K/uL (ref 4.0–10.5)
nRBC: 0 % (ref 0.0–0.2)

## 2023-12-11 LAB — C DIFFICILE QUICK SCREEN W PCR REFLEX
C Diff antigen: NEGATIVE
C Diff interpretation: NOT DETECTED
C Diff toxin: NEGATIVE

## 2023-12-11 LAB — MAGNESIUM: Magnesium: 2 mg/dL (ref 1.7–2.4)

## 2023-12-11 MED ORDER — SODIUM CHLORIDE 0.9 % IV SOLN: INTRAVENOUS | Status: AC

## 2023-12-11 MED ORDER — SIMETHICONE 80 MG PO CHEW
240.0000 mg | CHEWABLE_TABLET | Freq: Once | ORAL | Status: AC
  Administered 2023-12-11: 240 mg via ORAL
  Filled 2023-12-11: qty 3

## 2023-12-11 MED ORDER — BISACODYL 5 MG PO TBEC
10.0000 mg | DELAYED_RELEASE_TABLET | Freq: Once | ORAL | Status: AC
  Administered 2023-12-11: 10 mg via ORAL
  Filled 2023-12-11: qty 2

## 2023-12-11 MED ORDER — POLYETHYLENE GLYCOL 3350 17 GM/SCOOP PO POWD
119.0000 g | Freq: Once | ORAL | Status: AC
  Administered 2023-12-11: 119 g via ORAL
  Filled 2023-12-11: qty 119

## 2023-12-11 MED ORDER — POLYETHYLENE GLYCOL 3350 17 GM/SCOOP PO POWD
119.0000 g | Freq: Once | ORAL | Status: AC
  Administered 2023-12-12: 119 g via ORAL
  Filled 2023-12-11: qty 119

## 2023-12-11 MED ORDER — NA SULFATE-K SULFATE-MG SULF 17.5-3.13-1.6 GM/177ML PO SOLN: 0.5000 | Freq: Once | ORAL | Status: DC

## 2023-12-11 MED ORDER — NA SULFATE-K SULFATE-MG SULF 17.5-3.13-1.6 GM/177ML PO SOLN
0.5000 | Freq: Once | ORAL | Status: AC
  Administered 2023-12-11: 177 mL via ORAL
  Filled 2023-12-11: qty 1

## 2023-12-11 NOTE — Progress Notes (Addendum)
 PROGRESS NOTE    Holly Brown  FMW:[id number] DOB: [birth year] DOA: 12/08/2023 PCP: Corwin Antu, FNP   Brief Narrative:  38 year old female with history of cervical cancer status post hysterectomy, duodenal ulcer, recent admission in 10/2023 with colitis treated with antibiotics with plans for colonoscopy by [facility] as an outpatient presented with abdominal pain, nausea, vomiting and diarrhea.  On presentation, there is mild leukocytosis with lactic acidosis with imaging concerning for colitis.  She was started on IV antibiotics. GI was consulted.  Assessment & Plan:   Colitis -Patient with recent admission in 10/2023 with colitis and treated with antibiotics with plans for colonoscopy by  GI as an outpatient  - Presented again with findings concerning for colitis.  Currently on IV Zosyn .  -Stool for C. difficile and GI PCR negative. - GI following -still in significant pain and now having diarrhea again.  Does not feel ready to go home today.  E coli UTI: present on admission -antibiotics plan as above  Leukocytosis - Resolved  Hypokalemia - Resolved  Lactic acidosis--resolved  Obesity class I -Outpatient follow-up   DVT prophylaxis: Patient refusing Lovenox .  Start SCDs. Code Status: Full Family Communication: None at bedside Disposition Plan: Status is: Inpatient Remains inpatient appropriate because: Of severity of illness    Consultants: GI  Procedures: None  Antimicrobials: Zosyn  from 12/08/2023   Subjective: Patient seen and examined at bedside.  Does not feel well.  Continues to have intermittent abdominal pain with nausea.  Now having diarrhea again.  No fever, shortness with reported. Objective: Vitals:   12/10/23 2012 12/10/23 2343 12/11/23 0341 12/11/23 0756  BP: 135/68 (!) 104/54 (!) 97/54 117/71  Pulse: 63 (!) 53 (!) 57 (!) 59  Resp: 16 16 16 20   Temp: 98.3 F (36.8 C) 97.9 F (36.6 C) 98.3 F (36.8 C) 98.2 F (36.8 C)  TempSrc:  Oral Oral Oral   SpO2: 95% 100% 100% 100%  Weight:      Height:        Intake/Output Summary (Last 24 hours) at 12/11/2023 1018 Last data filed at 12/10/2023 2000 Gross per 24 hour  Intake 250 ml  Output --  Net 250 ml    Filed Weights   12/08/23 0157  Weight: 106.6 kg    Examination:  General: No acute distress.  Remains on room air.   respiratory: Bilateral decreased breath sounds at bases; no wheezing CVS: Rate mostly controlled; S1 and S2 are heard  abdominal: Soft, obese, still tender in the lower quadrant; remains distended send no organomegaly, bowel sounds are heard normally Extremities: No clubbing or edema   Data Reviewed: I have personally reviewed following labs and imaging studies  CBC: Recent Labs  Lab 12/08/23 0206 12/08/23 1528 12/09/23 0142 12/10/23 0148 12/11/23 0145  WBC 13.3* 11.4* 8.2 8.2 7.3  NEUTROABS 9.8*  --   --  4.7 3.9  HGB 12.7 11.9* 11.2* 11.6* 11.6*  HCT 38.6 36.2 34.3* 35.4* 34.5*  MCV 93.2 94.0 94.0 94.7 92.5  PLT 370 339 322 331 287   Basic Metabolic Panel: Recent Labs  Lab 12/08/23 0206 12/08/23 1528 12/09/23 0142 12/10/23 0148 12/11/23 0145  NA 142  --  137 140 139  K 3.6  --  3.1* 3.2* 3.5  CL 106  --  103 99 104  CO2 24  --  24 27 23   GLUCOSE 146*  --  102* 93 96  BUN 14  --  5* <5* <5*  CREATININE 1.07* 0.99  0.91 1.02* 1.06*  CALCIUM 9.5  --  8.4* 8.4* 8.4*  MG  --   --   --  1.9 2.0   GFR: Estimated Creatinine Clearance: 97.9 mL/min (A) (by C-G formula based on SCr of 1.06 mg/dL (H)). Liver Function Tests: Recent Labs  Lab 12/08/23 0206 12/11/23 0145  AST 16 34  ALT 9 38  ALKPHOS 67 47  BILITOT 0.5 1.2  PROT 7.0 5.6*  ALBUMIN 4.2 2.9*   Recent Labs  Lab 12/08/23 0206  LIPASE 17   No results for input(s): AMMONIA in the last 168 hours. Coagulation Profile: No results for input(s): INR, PROTIME in the last 168 hours. Cardiac Enzymes: No results for input(s): CKTOTAL, CKMB, CKMBINDEX,  TROPONINI in the last 168 hours. BNP (last 3 results) No results for input(s): PROBNP in the last 8760 hours. HbA1C: No results for input(s): HGBA1C in the last 72 hours. CBG: No results for input(s): GLUCAP in the last 168 hours. Lipid Profile: No results for input(s): CHOL, HDL, LDLCALC, TRIG, CHOLHDL, LDLDIRECT in the last 72 hours. Thyroid Function Tests: No results for input(s): TSH, T4TOTAL, FREET4, T3FREE, THYROIDAB in the last 72 hours. Anemia Panel: No results for input(s): VITAMINB12, FOLATE, FERRITIN, TIBC, IRON, RETICCTPCT in the last 72 hours. Sepsis Labs: Recent Labs  Lab 12/08/23 0206 12/08/23 0413 12/08/23 0727 12/08/23 1528  PROCALCITON  --   --   --  <0.10  LATICACIDVEN 2.8* 2.2* 1.5  --     Recent Results (from the past 240 hours)  Urine Culture     Status: Abnormal   Collection Time: 12/08/23  2:24 AM   Specimen: Urine, Clean Catch  Result Value Ref Range Status   Specimen Description   Final    URINE, CLEAN CATCH Performed at [facility], [address] [place], [place] [address]    Special Requests   Final    NONE Performed at [facility], [address] [place], [place] [address]    Culture >=100,000 COLONIES/mL ESCHERICHIA COLI (A)  Final   Report Status 12/10/2023 FINAL  Final   Organism ID, Bacteria ESCHERICHIA COLI (A)  Final      Susceptibility   Escherichia coli - MIC*    AMPICILLIN <=2 SENSITIVE Sensitive     CEFAZOLIN  (URINE) Value in next row Sensitive      <=1 SENSITIVEThis is a modified FDA-approved test that has been validated and its performance characteristics determined by the reporting laboratory.  This laboratory is certified under the Clinical Laboratory Improvement Amendments CLIA as qualified to perform high complexity clinical laboratory testing.    CEFEPIME Value in next row Sensitive      <=1 SENSITIVEThis is a modified FDA-approved test that has been  validated and its performance characteristics determined by the reporting laboratory.  This laboratory is certified under the Clinical Laboratory Improvement Amendments CLIA as qualified to perform high complexity clinical laboratory testing.    ERTAPENEM Value in next row Sensitive      <=1 SENSITIVEThis is a modified FDA-approved test that has been validated and its performance characteristics determined by the reporting laboratory.  This laboratory is certified under the Clinical Laboratory Improvement Amendments CLIA as qualified to perform high complexity clinical laboratory testing.    CEFTRIAXONE  Value in next row Sensitive      <=1 SENSITIVEThis is a modified FDA-approved test that has been validated and its performance characteristics determined by the reporting laboratory.  This laboratory is certified under  the Clinical Laboratory Improvement Amendments CLIA as qualified to perform high complexity clinical laboratory testing.    CIPROFLOXACIN  Value in next row Sensitive      <=1 SENSITIVEThis is a modified FDA-approved test that has been validated and its performance characteristics determined by the reporting laboratory.  This laboratory is certified under the Clinical Laboratory Improvement Amendments CLIA as qualified to perform high complexity clinical laboratory testing.    GENTAMICIN Value in next row Sensitive      <=1 SENSITIVEThis is a modified FDA-approved test that has been validated and its performance characteristics determined by the reporting laboratory.  This laboratory is certified under the Clinical Laboratory Improvement Amendments CLIA as qualified to perform high complexity clinical laboratory testing.    NITROFURANTOIN  Value in next row Sensitive      <=1 SENSITIVEThis is a modified FDA-approved test that has been validated and its performance characteristics determined by the reporting laboratory.  This laboratory is certified under the Clinical Laboratory Improvement  Amendments CLIA as qualified to perform high complexity clinical laboratory testing.    TRIMETH /SULFA  Value in next row Sensitive      <=1 SENSITIVEThis is a modified FDA-approved test that has been validated and its performance characteristics determined by the reporting laboratory.  This laboratory is certified under the Clinical Laboratory Improvement Amendments CLIA as qualified to perform high complexity clinical laboratory testing.    AMPICILLIN/SULBACTAM Value in next row Sensitive      <=1 SENSITIVEThis is a modified FDA-approved test that has been validated and its performance characteristics determined by the reporting laboratory.  This laboratory is certified under the Clinical Laboratory Improvement Amendments CLIA as qualified to perform high complexity clinical laboratory testing.    PIP/TAZO Value in next row Sensitive ug/mL     <=4 SENSITIVEThis is a modified FDA-approved test that has been validated and its performance characteristics determined by the reporting laboratory.  This laboratory is certified under the Clinical Laboratory Improvement Amendments CLIA as qualified to perform high complexity clinical laboratory testing.    MEROPENEM Value in next row Sensitive      <=4 SENSITIVEThis is a modified FDA-approved test that has been validated and its performance characteristics determined by the reporting laboratory.  This laboratory is certified under the Clinical Laboratory Improvement Amendments CLIA as qualified to perform high complexity clinical laboratory testing.    * >=100,000 COLONIES/mL ESCHERICHIA COLI  Gastrointestinal Panel by PCR , Stool     Status: None   Collection Time: 12/10/23 12:30 PM   Specimen: Stool  Result Value Ref Range Status   Campylobacter species NOT DETECTED NOT DETECTED Final   Plesimonas shigelloides NOT DETECTED NOT DETECTED Final   Salmonella species NOT DETECTED NOT DETECTED Final   Yersinia enterocolitica NOT DETECTED NOT DETECTED Final    Vibrio species NOT DETECTED NOT DETECTED Final   Vibrio cholerae NOT DETECTED NOT DETECTED Final   Enteroaggregative E coli (EAEC) NOT DETECTED NOT DETECTED Final   Enteropathogenic E coli (EPEC) NOT DETECTED NOT DETECTED Final   Enterotoxigenic E coli (ETEC) NOT DETECTED NOT DETECTED Final   Shiga like toxin producing E coli (STEC) NOT DETECTED NOT DETECTED Final   Shigella/Enteroinvasive E coli (EIEC) NOT DETECTED NOT DETECTED Final   Cryptosporidium NOT DETECTED NOT DETECTED Final   Cyclospora cayetanensis NOT DETECTED NOT DETECTED Final   Entamoeba histolytica NOT DETECTED NOT DETECTED Final   Giardia lamblia NOT DETECTED NOT DETECTED Final   Adenovirus F40/41 NOT DETECTED NOT DETECTED Final   Astrovirus NOT  DETECTED NOT DETECTED Final   Norovirus GI/GII NOT DETECTED NOT DETECTED Final   Rotavirus A NOT DETECTED NOT DETECTED Final   Sapovirus (I, II, IV, and V) NOT DETECTED NOT DETECTED Final    Comment: Performed at [facility], [address] [place], [place] [address]  C Difficile Quick Screen w PCR reflex     Status: None   Collection Time: 12/10/23 12:30 PM   Specimen: STOOL  Result Value Ref Range Status   C Diff antigen NEGATIVE NEGATIVE Final   C Diff toxin NEGATIVE NEGATIVE Final   C Diff interpretation No C. difficile detected.  Final    Comment: Performed at [facility], [address], [place], [place] [address]         Radiology Studies: No results found.       Scheduled Meds:  enoxaparin  (LOVENOX ) injection  40 mg Subcutaneous Q24H   pantoprazole   40 mg Oral Daily   polyethylene glycol  17 g Oral Daily   Continuous Infusions:  piperacillin -tazobactam (ZOSYN )  IV 3.375 g (12/11/23 0603)          Sophie Mao, MD [facility] 12/11/2023, 10:18 AM

## 2023-12-11 NOTE — Plan of Care (Signed)

## 2023-12-11 NOTE — Progress Notes (Addendum)
 Daily Progress Note  DOA: 12/08/2023 Hospital Day: 4  Cc: Diarrhea, colitis on CT scan  ASSESSMENT    Patient Profile: 38 y.o. year old female with a past medical history including but not necessarily limited to peptic ulcer disease, GERD , anxiety, hepatic steatosis, cervical cancer status post hysterectomy family history of colon cancer in father .  Patient admitted with recurrent nausea, vomiting, crampy diarrhea, leukocytosis and CT scan again says judging suggesting colitis versus decompressed colon.  See 12/09/2023 GI consult note  Recurrent nausea, vomiting, crampy diarrhea. Leukocytosis / lactic acidosis / mild increase in creatinine ( probably volume depletion from GI losses). CT scan  suggesting colitis vrs decompressed colon TODAY: WBC normal.  Had diarrhea again last night. C-diff and Gi path panel negative. Refusing Lovenox  DVT prophylaxis.    Chronic GERD   Family history of colon cancer in father Patient is scheduled for outpatient screening colonoscopy January 04, 2024   Principal Problem:   Colitis    PLAN   --Continue daily pantoprazole  --Will make primary team aware she isn't taking Lovenox  so they can decide about SCDs.  --On Zosyn , covering for E.coli UTI and also concern for infectious colitis on admission  --Schedule for a colonoscopy tomorrow.  The risks and benefits of colonoscopy with possible polypectomy / biopsies were discussed and the patient agrees to proceed. -- If colonoscopy is completed tomorrow as anticipated then we will cancel the one that she is scheduled to have done in our office next month  Subjective    Had 3-4 episodes of diarrhea last night, very little today.   Objective      Recent Labs    12/09/23 0142 12/10/23 0148 12/11/23 0145  WBC 8.2 8.2 7.3  HGB 11.2* 11.6* 11.6*  HCT 34.3* 35.4* 34.5*  MCV 94.0 94.7 92.5  PLT 322 331 287   No results for input(s): FOLATE, VITAMINB12, FERRITIN, TIBC,  IRONPCTSAT in the last 72 hours. Recent Labs    12/09/23 0142 12/10/23 0148 12/11/23 0145  NA 137 140 139  K 3.1* 3.2* 3.5  CL 103 99 104  CO2 24 27 23   GLUCOSE 102* 93 96  BUN 5* <5* <5*  CREATININE 0.91 1.02* 1.06*  CALCIUM 8.4* 8.4* 8.4*   Recent Labs    12/11/23 0145  PROT 5.6*  ALBUMIN 2.9*  AST 34  ALT 38  ALKPHOS 47  BILITOT 1.2    Imaging:  CT ABDOMEN PELVIS W CONTRAST CLINICAL DATA:  Left lower quadrant pain, nausea, vomiting, diarrhea  EXAM: CT ABDOMEN AND PELVIS WITH CONTRAST  TECHNIQUE: Multidetector CT imaging of the abdomen and pelvis was performed using the standard protocol following bolus administration of intravenous contrast.  RADIATION DOSE REDUCTION: This exam was performed according to the departmental dose-optimization program which includes automated exposure control, adjustment of the mA and/or kV according to patient size and/or use of iterative reconstruction technique.  CONTRAST:  OMNIPAQUE  IOHEXOL  300 MG/ML  SOLN  COMPARISON:  10/25/2023  FINDINGS: Lower chest: No acute abnormality  Hepatobiliary: Diffuse low-density throughout the liver compatible with fatty infiltration. No focal abnormality. Gallbladder unremarkable.  Pancreas: No focal abnormality or ductal dilatation.  Spleen: No focal abnormality.  Normal size.  Adrenals/Urinary Tract: No adrenal abnormality. No focal renal abnormality. No stones or hydronephrosis. Urinary bladder is unremarkable.  Stomach/Bowel: Stomach and small bowel decompressed. Colon is also decompressed but again appears thick walled suggesting colitis, similar to prior study. No bowel obstruction.  Vascular/Lymphatic: No evidence  of aneurysm or adenopathy.  Reproductive: Prior hysterectomy.  No adnexal masses.  Other: No free fluid or free air.  Musculoskeletal: No acute bony abnormality.  IMPRESSION: Colon is decompressed and difficult evaluate, but appears to again be  diffusely thick walled suggesting colitis. Recommend clinical correlation.  Hepatic steatosis.  Electronically Signed   By: Franky Crease M.D.   On: 12/08/2023 03:17 DG Chest Port 1 View CLINICAL DATA:  Chest pain  EXAM: PORTABLE CHEST 1 VIEW  COMPARISON:  11/19/2023  FINDINGS: The heart size and mediastinal contours are within normal limits. Both lungs are clear. The visualized skeletal structures are unremarkable. No pneumothorax  IMPRESSION: No active disease.  Electronically Signed   By: Franky Crease M.D.   On: 12/08/2023 02:24     Scheduled inpatient medications:   enoxaparin  (LOVENOX ) injection  40 mg Subcutaneous Q24H   pantoprazole   40 mg Oral Daily   Continuous inpatient infusions:   piperacillin -tazobactam (ZOSYN )  IV Stopped (12/11/23 1003)   PRN inpatient medications: acetaminophen , HYDROmorphone  (DILAUDID ) injection, ondansetron  (ZOFRAN ) IV, mouth rinse, oxyCODONE   Vital signs in last 24 hours: Temp:  [97.9 F (36.6 C)-98.6 F (37 C)] 98.6 F (37 C) (08/22 1251) Pulse Rate:  [53-63] 61 (08/22 1251) Resp:  [16-20] 18 (08/22 1251) BP: (97-135)/(54-75) 114/75 (08/22 1251) SpO2:  [95 %-100 %] 100 % (08/22 1251) Last BM Date : 12/10/23  Intake/Output Summary (Last 24 hours) at 12/11/2023 1404 Last data filed at 12/10/2023 2000 Gross per 24 hour  Intake 250 ml  Output --  Net 250 ml    Intake/Output from previous day: 08/21 0701 - 08/22 0700 In: 250 [P.O.:240] Out: -  Intake/Output this shift: No intake/output data recorded.   Physical Exam:  General: Alert female in NAD Heart:  Regular rate and rhythm.  Pulmonary: Normal respiratory effort Abdomen: Soft, nondistended, nontender. Normal bowel sounds. Extremities: No lower extremity edema  Neurologic: Alert and oriented Psych: Pleasant. Cooperative     LOS: 3 days   Vina Dasen ,NP 12/11/2023, 2:04 PM   I personally saw the patient and performed a substantive portion of this  encounter (>50% time spent), including a complete performance of at least one of the key components (MDM, Hx and/or Exam), in conjunction with the APP.  I agree with the APP's note, impression, and recommendations with additional input as follows.   Patient is having diarrhea again.  C. difficile stool study was not able to be submitted because it was too formed.  GI pathogen panel was negative.  Fecal calprotectin was not collected for some reason.  Will plan for colonoscopy tomorrow for further evaluation due to ongoing issues with abdominal pain and diarrhea along with colitis seen on imaging.

## 2023-12-11 NOTE — H&P (View-Only) (Signed)
 Daily Progress Note  DOA: 12/08/2023 Hospital Day: 4  Cc: Diarrhea, colitis on CT scan  ASSESSMENT    Patient Profile: 38 y.o. year old female with a past medical history including but not necessarily limited to peptic ulcer disease, GERD , anxiety, hepatic steatosis, cervical cancer status post hysterectomy family history of colon cancer in father .  Patient admitted with recurrent nausea, vomiting, crampy diarrhea, leukocytosis and CT scan again says judging suggesting colitis versus decompressed colon.  See 12/09/2023 GI consult note  Recurrent nausea, vomiting, crampy diarrhea. Leukocytosis / lactic acidosis / mild increase in creatinine ( probably volume depletion from GI losses). CT scan  suggesting colitis vrs decompressed colon TODAY: WBC normal.  Had diarrhea again last night. C-diff and Gi path panel negative. Refusing Lovenox  DVT prophylaxis.    Chronic GERD   Family history of colon cancer in father Patient is scheduled for outpatient screening colonoscopy January 04, 2024   Principal Problem:   Colitis    PLAN   --Continue daily pantoprazole  --Will make primary team aware she isn't taking Lovenox  so they can decide about SCDs.  --On Zosyn , covering for E.coli UTI and also concern for infectious colitis on admission  --Schedule for a colonoscopy tomorrow.  The risks and benefits of colonoscopy with possible polypectomy / biopsies were discussed and the patient agrees to proceed. -- If colonoscopy is completed tomorrow as anticipated then we will cancel the one that she is scheduled to have done in our office next month  Subjective    Had 3-4 episodes of diarrhea last night, very little today.   Objective      Recent Labs    12/09/23 0142 12/10/23 0148 12/11/23 0145  WBC 8.2 8.2 7.3  HGB 11.2* 11.6* 11.6*  HCT 34.3* 35.4* 34.5*  MCV 94.0 94.7 92.5  PLT 322 331 287   No results for input(s): FOLATE, VITAMINB12, FERRITIN, TIBC,  IRONPCTSAT in the last 72 hours. Recent Labs    12/09/23 0142 12/10/23 0148 12/11/23 0145  NA 137 140 139  K 3.1* 3.2* 3.5  CL 103 99 104  CO2 24 27 23   GLUCOSE 102* 93 96  BUN 5* <5* <5*  CREATININE 0.91 1.02* 1.06*  CALCIUM 8.4* 8.4* 8.4*   Recent Labs    12/11/23 0145  PROT 5.6*  ALBUMIN 2.9*  AST 34  ALT 38  ALKPHOS 47  BILITOT 1.2    Imaging:  CT ABDOMEN PELVIS W CONTRAST CLINICAL DATA:  Left lower quadrant pain, nausea, vomiting, diarrhea  EXAM: CT ABDOMEN AND PELVIS WITH CONTRAST  TECHNIQUE: Multidetector CT imaging of the abdomen and pelvis was performed using the standard protocol following bolus administration of intravenous contrast.  RADIATION DOSE REDUCTION: This exam was performed according to the departmental dose-optimization program which includes automated exposure control, adjustment of the mA and/or kV according to patient size and/or use of iterative reconstruction technique.  CONTRAST:  OMNIPAQUE  IOHEXOL  300 MG/ML  SOLN  COMPARISON:  10/25/2023  FINDINGS: Lower chest: No acute abnormality  Hepatobiliary: Diffuse low-density throughout the liver compatible with fatty infiltration. No focal abnormality. Gallbladder unremarkable.  Pancreas: No focal abnormality or ductal dilatation.  Spleen: No focal abnormality.  Normal size.  Adrenals/Urinary Tract: No adrenal abnormality. No focal renal abnormality. No stones or hydronephrosis. Urinary bladder is unremarkable.  Stomach/Bowel: Stomach and small bowel decompressed. Colon is also decompressed but again appears thick walled suggesting colitis, similar to prior study. No bowel obstruction.  Vascular/Lymphatic: No evidence  of aneurysm or adenopathy.  Reproductive: Prior hysterectomy.  No adnexal masses.  Other: No free fluid or free air.  Musculoskeletal: No acute bony abnormality.  IMPRESSION: Colon is decompressed and difficult evaluate, but appears to again be  diffusely thick walled suggesting colitis. Recommend clinical correlation.  Hepatic steatosis.  Electronically Signed   By: Franky Crease M.D.   On: 12/08/2023 03:17 DG Chest Port 1 View CLINICAL DATA:  Chest pain  EXAM: PORTABLE CHEST 1 VIEW  COMPARISON:  11/19/2023  FINDINGS: The heart size and mediastinal contours are within normal limits. Both lungs are clear. The visualized skeletal structures are unremarkable. No pneumothorax  IMPRESSION: No active disease.  Electronically Signed   By: Franky Crease M.D.   On: 12/08/2023 02:24     Scheduled inpatient medications:   enoxaparin  (LOVENOX ) injection  40 mg Subcutaneous Q24H   pantoprazole   40 mg Oral Daily   Continuous inpatient infusions:   piperacillin -tazobactam (ZOSYN )  IV Stopped (12/11/23 1003)   PRN inpatient medications: acetaminophen , HYDROmorphone  (DILAUDID ) injection, ondansetron  (ZOFRAN ) IV, mouth rinse, oxyCODONE   Vital signs in last 24 hours: Temp:  [97.9 F (36.6 C)-98.6 F (37 C)] 98.6 F (37 C) (08/22 1251) Pulse Rate:  [53-63] 61 (08/22 1251) Resp:  [16-20] 18 (08/22 1251) BP: (97-135)/(54-75) 114/75 (08/22 1251) SpO2:  [95 %-100 %] 100 % (08/22 1251) Last BM Date : 12/10/23  Intake/Output Summary (Last 24 hours) at 12/11/2023 1404 Last data filed at 12/10/2023 2000 Gross per 24 hour  Intake 250 ml  Output --  Net 250 ml    Intake/Output from previous day: 08/21 0701 - 08/22 0700 In: 250 [P.O.:240] Out: -  Intake/Output this shift: No intake/output data recorded.   Physical Exam:  General: Alert female in NAD Heart:  Regular rate and rhythm.  Pulmonary: Normal respiratory effort Abdomen: Soft, nondistended, nontender. Normal bowel sounds. Extremities: No lower extremity edema  Neurologic: Alert and oriented Psych: Pleasant. Cooperative     LOS: 3 days   Vina Dasen ,NP 12/11/2023, 2:04 PM   I personally saw the patient and performed a substantive portion of this  encounter (>50% time spent), including a complete performance of at least one of the key components (MDM, Hx and/or Exam), in conjunction with the APP.  I agree with the APP's note, impression, and recommendations with additional input as follows.   Patient is having diarrhea again.  C. difficile stool study was not able to be submitted because it was too formed.  GI pathogen panel was negative.  Fecal calprotectin was not collected for some reason.  Will plan for colonoscopy tomorrow for further evaluation due to ongoing issues with abdominal pain and diarrhea along with colitis seen on imaging.

## 2023-12-12 ENCOUNTER — Encounter (HOSPITAL_COMMUNITY)
Admission: EM | Disposition: A | Discharge status: Home / Self Care | Payer: Self-pay | Source: Home / Self Care | Attending: Internal Medicine

## 2023-12-12 ENCOUNTER — Inpatient Hospital Stay (HOSPITAL_COMMUNITY): Admitting: Anesthesiology

## 2023-12-12 ENCOUNTER — Encounter (HOSPITAL_COMMUNITY): Payer: Self-pay | Admitting: Internal Medicine

## 2023-12-12 DIAGNOSIS — K6289 Other specified diseases of anus and rectum: Secondary | ICD-10-CM

## 2023-12-12 DIAGNOSIS — R1030 Lower abdominal pain, unspecified: Secondary | ICD-10-CM

## 2023-12-12 DIAGNOSIS — K6389 Other specified diseases of intestine: Secondary | ICD-10-CM

## 2023-12-12 DIAGNOSIS — D122 Benign neoplasm of ascending colon: Secondary | ICD-10-CM

## 2023-12-12 DIAGNOSIS — K648 Other hemorrhoids: Secondary | ICD-10-CM

## 2023-12-12 DIAGNOSIS — K529 Noninfective gastroenteritis and colitis, unspecified: Secondary | ICD-10-CM | POA: Diagnosis not present

## 2023-12-12 DIAGNOSIS — F418 Other specified anxiety disorders: Secondary | ICD-10-CM

## 2023-12-12 HISTORY — PX: BONE BIOPSY: SHX375

## 2023-12-12 HISTORY — PX: POLYPECTOMY: SHX149

## 2023-12-12 HISTORY — PX: COLONOSCOPY: SHX5424

## 2023-12-12 SURGERY — COLONOSCOPY
Anesthesia: Monitor Anesthesia Care

## 2023-12-12 MED ORDER — PROPOFOL 10 MG/ML IV BOLUS
INTRAVENOUS | Status: DC | PRN
  Administered 2023-12-12: 40 mg via INTRAVENOUS

## 2023-12-12 MED ORDER — DICYCLOMINE HCL 10 MG PO CAPS
10.0000 mg | ORAL_CAPSULE | Freq: Three times a day (TID) | ORAL | Status: DC | PRN
  Administered 2023-12-13: 10 mg via ORAL
  Filled 2023-12-12: qty 1

## 2023-12-12 MED ORDER — LIDOCAINE 2% (20 MG/ML) 5 ML SYRINGE
INTRAMUSCULAR | Status: DC | PRN
Start: 2023-12-12 — End: 2023-12-12
  Administered 2023-12-12: 100 mg via INTRAVENOUS

## 2023-12-12 MED ORDER — ONDANSETRON HCL 4 MG/2ML IJ SOLN
INTRAMUSCULAR | Status: DC | PRN
  Administered 2023-12-12: 4 mg via INTRAVENOUS

## 2023-12-12 MED ORDER — PROPOFOL 500 MG/50ML IV EMUL
INTRAVENOUS | Status: DC | PRN
  Administered 2023-12-12: 200 ug/kg/min via INTRAVENOUS

## 2023-12-12 MED ORDER — PHENYLEPHRINE 80 MCG/ML (10ML) SYRINGE FOR IV PUSH (FOR BLOOD PRESSURE SUPPORT)
PREFILLED_SYRINGE | INTRAVENOUS | Status: DC | PRN
Start: 2023-12-12 — End: 2023-12-12
  Administered 2023-12-12: 80 ug via INTRAVENOUS

## 2023-12-12 MED ORDER — BISMUTH SUBSALICYLATE 262 MG PO CHEW: 524.0000 mg | CHEWABLE_TABLET | Freq: Four times a day (QID) | ORAL | Status: DC | PRN

## 2023-12-12 NOTE — Progress Notes (Signed)
 Patient is having a bowel movement now, after the bowel prep was given.

## 2023-12-12 NOTE — Transfer of Care (Signed)
 Immediate Anesthesia Transfer of Care Note  Patient: Holly Brown  Procedure(s) Performed: COLONOSCOPY POLYPECTOMY, INTESTINE BIOPSY, GI  Patient Location: PACU and Endoscopy Unit  Anesthesia Type:MAC  Level of Consciousness: awake and patient cooperative  Airway & Oxygen Therapy: Patient Spontanous Breathing and Patient connected to face mask oxygen  Post-op Assessment: Report given to RN and Post -op Vital signs reviewed and stable  Post vital signs: Reviewed and stable  Last Vitals:  Vitals Value Taken Time  BP 128/81 12/12/23 13:40  Temp 36.3 C 12/12/23 13:40  Pulse 64 12/12/23 13:45  Resp 13 12/12/23 13:45  SpO2 100 % 12/12/23 13:45  Vitals shown include unfiled device data.  Last Pain:  Vitals:   12/12/23 1340  TempSrc: Temporal  PainSc:       Patients Stated Pain Goal: 0 (12/12/23 0934)  Complications: No notable events documented.

## 2023-12-12 NOTE — Anesthesia Preprocedure Evaluation (Addendum)
 Anesthesia Evaluation  Patient identified by MRN, date of birth, ID band Patient awake    Reviewed: Allergy & Precautions, NPO status , Patient's Chart, lab work & pertinent test results  History of Anesthesia Complications (+) PONV and history of anesthetic complications  Airway Mallampati: II       Dental  (+) Chipped,    Pulmonary sleep apnea , former smoker   Pulmonary exam normal        Cardiovascular + Peripheral Vascular Disease  Normal cardiovascular exam     Neuro/Psych  Headaches, Seizures -,  PSYCHIATRIC DISORDERS Anxiety Depression       GI/Hepatic Neg liver ROS, hiatal hernia, PUD,,,  Endo/Other  diabetes    Renal/GU Renal disease     Musculoskeletal negative musculoskeletal ROS (+)    Abdominal  (+) + obese  Peds  Hematology  (+) Blood dyscrasia, anemia   Anesthesia Other Findings Diarrhea Abnormal colon on CT scan  Reproductive/Obstetrics Hcg negative S/P hysterectomy TUBAL LIGATION                              Anesthesia Physical Anesthesia Plan  ASA: 2  Anesthesia Plan: MAC   Post-op Pain Management:    Induction:   PONV Risk Score and Plan: 4 or greater and Propofol  infusion, Treatment may vary due to age or medical condition and Ondansetron   Airway Management Planned: Simple Face Mask  Additional Equipment:   Intra-op Plan:   Post-operative Plan:   Informed Consent: I have reviewed the patients History and Physical, chart, labs and discussed the procedure including the risks, benefits and alternatives for the proposed anesthesia with the patient or authorized representative who has indicated his/her understanding and acceptance.     Dental advisory given  Plan Discussed with: CRNA  Anesthesia Plan Comments:          Anesthesia Quick Evaluation

## 2023-12-12 NOTE — Progress Notes (Signed)
 Second dose of ordered bowel prep was started at the scheduled time, 0359 am. Medication instructions were provided to the patient, she expressed fully understanding. Patient remains NPO for upcoming procedure today. Will continue to monitor.

## 2023-12-12 NOTE — OR Nursing (Signed)
 Pt per floor RN verbalizing she does not want to wait until 14:00 to eat; but pt needs to be NPO for ENDO procedure. ENDO RN asked floor RN to tell patient we can not do procedure unless she remains NPO and we are getting to her ASAP. Collene JAYSON Edu [place]

## 2023-12-12 NOTE — Interval H&P Note (Signed)
 History and Physical Interval Note:  12/12/2023 12:17 PM  Holly Brown  has presented today for surgery, with the diagnosis of Diarrhea and abnormal colon on CT scan.  The various methods of treatment have been discussed with the patient and family. After consideration of risks, benefits and other options for treatment, the patient has consented to  Procedure(s): COLONOSCOPY (N/A) as a surgical intervention.  The patient's history has been reviewed, patient examined, no change in status, stable for surgery.  I have reviewed the patient's chart and labs.  Questions were answered to the patient's satisfaction.     Jestine Bicknell C Anael Rosch

## 2023-12-12 NOTE — Anesthesia Procedure Notes (Signed)
 Procedure Name: MAC Date/Time: 12/12/2023 1:06 PM  Performed by: Laverda Burnard LABOR, CRNAPre-anesthesia Checklist: Patient identified, Emergency Drugs available, Suction available and Patient being monitored Patient Re-evaluated:Patient Re-evaluated prior to induction Oxygen Delivery Method: Nasal cannula Preoxygenation: Pre-oxygenation with 100% oxygen Induction Type: IV induction Placement Confirmation: positive ETCO2 and breath sounds checked- equal and bilateral Dental Injury: Teeth and Oropharynx as per pre-operative assessment

## 2023-12-12 NOTE — Anesthesia Postprocedure Evaluation (Signed)
 Anesthesia Post Note  Patient: Holly Brown  Procedure(s) Performed: COLONOSCOPY POLYPECTOMY, INTESTINE BIOPSY, GI     Patient location during evaluation: Endoscopy Anesthesia Type: MAC Level of consciousness: awake Pain management: pain level controlled Vital Signs Assessment: post-procedure vital signs reviewed and stable Respiratory status: spontaneous breathing, nonlabored ventilation and respiratory function stable Cardiovascular status: blood pressure returned to baseline and stable Postop Assessment: no apparent nausea or vomiting Anesthetic complications: no   No notable events documented.  Last Vitals:  Vitals:   12/12/23 1400 12/12/23 1638  BP: (!) 105/49 (!) 106/56  Pulse: (!) 49 67  Resp: 19 20  Temp:  36.9 C  SpO2: 99% 100%    Last Pain:  Vitals:   12/12/23 1400  TempSrc:   PainSc: 0-No pain                 Jordany Russett P Darci Lykins

## 2023-12-12 NOTE — Progress Notes (Signed)
 PROGRESS NOTE    Holly Brown  FMW:[id number] DOB: Jan 02, 1986 DOA: 12/08/2023 PCP: Corwin Antu, FNP   Brief Narrative:  39 year old female with history of cervical cancer status post hysterectomy, duodenal ulcer, recent admission in 10/2023 with colitis treated with antibiotics with plans for colonoscopy by [facility] as an outpatient presented with abdominal pain, nausea, vomiting and diarrhea.  On presentation, there is mild leukocytosis with lactic acidosis with imaging concerning for colitis.  She was started on IV antibiotics. GI was consulted.  Assessment & Plan:   Colitis -Patient with recent admission in 10/2023 with colitis and treated with antibiotics with plans for colonoscopy by  GI as an outpatient  - Presented again with findings concerning for colitis.  Currently on IV Zosyn .  -Stool for C. difficile and GI PCR negative. - GI following: Planning for possible colonoscopy today  E coli UTI: present on admission -antibiotics plan as above  Leukocytosis - Resolved  Hypokalemia - Resolved  Lactic acidosis--resolved  Obesity class I -Outpatient follow-up   DVT prophylaxis: Patient refusing Lovenox .  SCDs. Code Status: Full Family Communication: None at bedside Disposition Plan: Status is: Inpatient Remains inpatient appropriate because: Of severity of illness    Consultants: GI  Procedures: None  Antimicrobials: Zosyn  from 12/08/2023   Subjective: Patient seen and examined at bedside.  Still having intermittent nausea with abdominal pain.  No fever or chest pain reported.   Objective: Vitals:   12/11/23 1634 12/11/23 2123 12/11/23 2334 12/12/23 0428  BP: 113/74 122/72 (!) 112/54 (!) 110/59  Pulse: (!) 56 (!) 55 61 (!) 59  Resp: 20 16 16 16   Temp: 98.5 F (36.9 C) 98.1 F (36.7 C) 98 F (36.7 C) 98.2 F (36.8 C)  TempSrc:      SpO2: 100% 100% 98% 100%  Weight:      Height:        Intake/Output Summary (Last 24 hours) at 12/12/2023  0749 Last data filed at 12/12/2023 0300 Gross per 24 hour  Intake 302.5 ml  Output --  Net 302.5 ml    Filed Weights   12/08/23 0157  Weight: 106.6 kg    Examination:  General: On room air.  No distress.  respiratory: Decreased breath sounds at bases bilaterally with some crackles CVS: Currently rate controlled; S1-S2 heard  abdominal: Soft, obese, lower quadrant tenderness present; slightly distended, no organomegaly; normal bowel sounds are heard  extremities: No cyanosis or clubbing  Data Reviewed: I have personally reviewed following labs and imaging studies  CBC: Recent Labs  Lab 12/08/23 0206 12/08/23 1528 12/09/23 0142 12/10/23 0148 12/11/23 0145  WBC 13.3* 11.4* 8.2 8.2 7.3  NEUTROABS 9.8*  --   --  4.7 3.9  HGB 12.7 11.9* 11.2* 11.6* 11.6*  HCT 38.6 36.2 34.3* 35.4* 34.5*  MCV 93.2 94.0 94.0 94.7 92.5  PLT 370 339 322 331 287   Basic Metabolic Panel: Recent Labs  Lab 12/08/23 0206 12/08/23 1528 12/09/23 0142 12/10/23 0148 12/11/23 0145  NA 142  --  137 140 139  K 3.6  --  3.1* 3.2* 3.5  CL 106  --  103 99 104  CO2 24  --  24 27 23   GLUCOSE 146*  --  102* 93 96  BUN 14  --  5* <5* <5*  CREATININE 1.07* 0.99 0.91 1.02* 1.06*  CALCIUM 9.5  --  8.4* 8.4* 8.4*  MG  --   --   --  1.9 2.0   GFR: Estimated Creatinine  Clearance: 97.9 mL/min (A) (by C-G formula based on SCr of 1.06 mg/dL (H)). Liver Function Tests: Recent Labs  Lab 12/08/23 0206 12/11/23 0145  AST 16 34  ALT 9 38  ALKPHOS 67 47  BILITOT 0.5 1.2  PROT 7.0 5.6*  ALBUMIN 4.2 2.9*   Recent Labs  Lab 12/08/23 0206  LIPASE 17   No results for input(s): AMMONIA in the last 168 hours. Coagulation Profile: No results for input(s): INR, PROTIME in the last 168 hours. Cardiac Enzymes: No results for input(s): CKTOTAL, CKMB, CKMBINDEX, TROPONINI in the last 168 hours. BNP (last 3 results) No results for input(s): PROBNP in the last 8760 hours. HbA1C: No results for  input(s): HGBA1C in the last 72 hours. CBG: No results for input(s): GLUCAP in the last 168 hours. Lipid Profile: No results for input(s): CHOL, HDL, LDLCALC, TRIG, CHOLHDL, LDLDIRECT in the last 72 hours. Thyroid Function Tests: No results for input(s): TSH, T4TOTAL, FREET4, T3FREE, THYROIDAB in the last 72 hours. Anemia Panel: No results for input(s): VITAMINB12, FOLATE, FERRITIN, TIBC, IRON, RETICCTPCT in the last 72 hours. Sepsis Labs: Recent Labs  Lab 12/08/23 0206 12/08/23 0413 12/08/23 0727 12/08/23 1528  PROCALCITON  --   --   --  <0.10  LATICACIDVEN 2.8* 2.2* 1.5  --     Recent Results (from the past 240 hours)  Urine Culture     Status: Abnormal   Collection Time: 12/08/23  2:24 AM   Specimen: Urine, Clean Catch  Result Value Ref Range Status   Specimen Description   Final    URINE, CLEAN CATCH Performed at [facility], [address] [place], [place] [address]    Special Requests   Final    NONE Performed at [facility], [address] [place], [place] [address]    Culture >=100,000 COLONIES/mL ESCHERICHIA COLI (A)  Final   Report Status 12/10/2023 FINAL  Final   Organism ID, Bacteria ESCHERICHIA COLI (A)  Final      Susceptibility   Escherichia coli - MIC*    AMPICILLIN <=2 SENSITIVE Sensitive     CEFAZOLIN  (URINE) Value in next row Sensitive      <=1 SENSITIVEThis is a modified FDA-approved test that has been validated and its performance characteristics determined by the reporting laboratory.  This laboratory is certified under the Clinical Laboratory Improvement Amendments CLIA as qualified to perform high complexity clinical laboratory testing.    CEFEPIME Value in next row Sensitive      <=1 SENSITIVEThis is a modified FDA-approved test that has been validated and its performance characteristics determined by the reporting laboratory.  This laboratory is certified under the Clinical Laboratory  Improvement Amendments CLIA as qualified to perform high complexity clinical laboratory testing.    ERTAPENEM Value in next row Sensitive      <=1 SENSITIVEThis is a modified FDA-approved test that has been validated and its performance characteristics determined by the reporting laboratory.  This laboratory is certified under the Clinical Laboratory Improvement Amendments CLIA as qualified to perform high complexity clinical laboratory testing.    CEFTRIAXONE  Value in next row Sensitive      <=1 SENSITIVEThis is a modified FDA-approved test that has been validated and its performance characteristics determined by the reporting laboratory.  This laboratory is certified under the Clinical Laboratory Improvement Amendments CLIA as qualified to perform high complexity clinical laboratory testing.    CIPROFLOXACIN  Value in next row Sensitive      <=1  SENSITIVEThis is a modified FDA-approved test that has been validated and its performance characteristics determined by the reporting laboratory.  This laboratory is certified under the Clinical Laboratory Improvement Amendments CLIA as qualified to perform high complexity clinical laboratory testing.    GENTAMICIN Value in next row Sensitive      <=1 SENSITIVEThis is a modified FDA-approved test that has been validated and its performance characteristics determined by the reporting laboratory.  This laboratory is certified under the Clinical Laboratory Improvement Amendments CLIA as qualified to perform high complexity clinical laboratory testing.    NITROFURANTOIN  Value in next row Sensitive      <=1 SENSITIVEThis is a modified FDA-approved test that has been validated and its performance characteristics determined by the reporting laboratory.  This laboratory is certified under the Clinical Laboratory Improvement Amendments CLIA as qualified to perform high complexity clinical laboratory testing.    TRIMETH /SULFA  Value in next row Sensitive      <=1  SENSITIVEThis is a modified FDA-approved test that has been validated and its performance characteristics determined by the reporting laboratory.  This laboratory is certified under the Clinical Laboratory Improvement Amendments CLIA as qualified to perform high complexity clinical laboratory testing.    AMPICILLIN/SULBACTAM Value in next row Sensitive      <=1 SENSITIVEThis is a modified FDA-approved test that has been validated and its performance characteristics determined by the reporting laboratory.  This laboratory is certified under the Clinical Laboratory Improvement Amendments CLIA as qualified to perform high complexity clinical laboratory testing.    PIP/TAZO Value in next row Sensitive ug/mL     <=4 SENSITIVEThis is a modified FDA-approved test that has been validated and its performance characteristics determined by the reporting laboratory.  This laboratory is certified under the Clinical Laboratory Improvement Amendments CLIA as qualified to perform high complexity clinical laboratory testing.    MEROPENEM Value in next row Sensitive      <=4 SENSITIVEThis is a modified FDA-approved test that has been validated and its performance characteristics determined by the reporting laboratory.  This laboratory is certified under the Clinical Laboratory Improvement Amendments CLIA as qualified to perform high complexity clinical laboratory testing.    * >=100,000 COLONIES/mL ESCHERICHIA COLI  Gastrointestinal Panel by PCR , Stool     Status: None   Collection Time: 12/10/23 12:30 PM   Specimen: Stool  Result Value Ref Range Status   Campylobacter species NOT DETECTED NOT DETECTED Final   Plesimonas shigelloides NOT DETECTED NOT DETECTED Final   Salmonella species NOT DETECTED NOT DETECTED Final   Yersinia enterocolitica NOT DETECTED NOT DETECTED Final   Vibrio species NOT DETECTED NOT DETECTED Final   Vibrio cholerae NOT DETECTED NOT DETECTED Final   Enteroaggregative E coli (EAEC) NOT  DETECTED NOT DETECTED Final   Enteropathogenic E coli (EPEC) NOT DETECTED NOT DETECTED Final   Enterotoxigenic E coli (ETEC) NOT DETECTED NOT DETECTED Final   Shiga like toxin producing E coli (STEC) NOT DETECTED NOT DETECTED Final   Shigella/Enteroinvasive E coli (EIEC) NOT DETECTED NOT DETECTED Final   Cryptosporidium NOT DETECTED NOT DETECTED Final   Cyclospora cayetanensis NOT DETECTED NOT DETECTED Final   Entamoeba histolytica NOT DETECTED NOT DETECTED Final   Giardia lamblia NOT DETECTED NOT DETECTED Final   Adenovirus F40/41 NOT DETECTED NOT DETECTED Final   Astrovirus NOT DETECTED NOT DETECTED Final   Norovirus GI/GII NOT DETECTED NOT DETECTED Final   Rotavirus A NOT DETECTED NOT DETECTED Final   Sapovirus (I, II, IV, and V)  NOT DETECTED NOT DETECTED Final    Comment: Performed at [facility], [address] [place], [place] [address]  C Difficile Quick Screen w PCR reflex     Status: None   Collection Time: 12/10/23 12:30 PM   Specimen: STOOL  Result Value Ref Range Status   C Diff antigen NEGATIVE NEGATIVE Final   C Diff toxin NEGATIVE NEGATIVE Final   C Diff interpretation No C. difficile detected.  Final    Comment: Performed at [facility], [address], [place], [place] [address]         Radiology Studies: No results found.       Scheduled Meds:  pantoprazole   40 mg Oral Daily   Continuous Infusions:  sodium chloride      sodium chloride  10 mL/hr at 12/11/23 2103   piperacillin -tazobactam (ZOSYN )  IV 3.375 g (12/12/23 0400)          Sophie Mao, MD [facility] 12/12/2023, 7:49 AM

## 2023-12-12 NOTE — Op Note (Addendum)
 [facility] Patient Name: Holly Brown Procedure Date : 12/12/2023 MRN: [id number] Attending MD: Rosario Estefana Kidney , , [id number] Date of Birth: [birth year] CSN: [id number] Age: 38 Admit Type: Inpatient Procedure:                Colonoscopy Indications:              Abdominal pain, Diarrhea, Abnormal CT of the GI                            tract Providers:                Rosario Estefana Kidney Collene Jock, RN, Curtistine Bishop, Technician Referring MD:             Hospitalist team Medicines:                Monitored Anesthesia Care Complications:            No immediate complications. Estimated Blood Loss:     Estimated blood loss was minimal. Procedure:                Pre-Anesthesia Assessment:                           - Prior to the procedure, a History and Physical                            was performed, and patient medications and                            allergies were reviewed. The patient's tolerance of                            previous anesthesia was also reviewed. The risks                            and benefits of the procedure and the sedation                            options and risks were discussed with the patient.                            All questions were answered, and informed consent                            was obtained. Prior Anticoagulants: The patient has                            taken no anticoagulant or antiplatelet agents. ASA                            Grade Assessment: III - A patient with severe  systemic disease. After reviewing the risks and                            benefits, the patient was deemed in satisfactory                            condition to undergo the procedure.                           After obtaining informed consent, the colonoscope                            was passed under direct vision. Throughout the                            procedure, the  patient's blood pressure, pulse, and                            oxygen saturations were monitored continuously. The                            CF-HQ190L ([id number]) Olympus colonoscopy was                            introduced through the anus and advanced to the the                            terminal ileum. The colonoscopy was performed                            without difficulty. The patient tolerated the                            procedure well. The quality of the bowel                            preparation was excellent. The terminal ileum,                            ileocecal valve, appendiceal orifice, and rectum                            were photographed. Scope In: 1:07:37 PM Scope Out: 1:33:03 PM Scope Withdrawal Time: 0 hours 17 minutes 59 seconds  Total Procedure Duration: 0 hours 25 minutes 26 seconds  Findings:      The terminal ileum appeared normal.      A 3 mm polyp was found in the ascending colon. The polyp was sessile.       The polyp was removed with a cold snare. Resection and retrieval were       complete.      A localized area of mildly erythematous mucosa was found in the       ascending colon. This along with the rest of the colon was biopsied with       a cold forceps for histology.  A localized area of erythematous mucosa was found in the rectum. This       was biopsied with a cold forceps for histology.      Non-bleeding internal hemorrhoids were found during retroflexion. Impression:               - The examined portion of the ileum was normal.                           - One 3 mm polyp in the ascending colon, removed                            with a cold snare. Resected and retrieved.                           - Erythematous mucosa in the ascending colon.                            Biopsied.                           - Erythematous mucosa in the rectum. Biopsied.                           - Non-bleeding internal hemorrhoids. Recommendation:            - Return patient to hospital ward for ongoing care.                           - Await pathology results.                           - Okay to use Pepto Bismol PRN for diarrhea.                           - Will start on Bentyl  10 mg TID PRN for ab pain.                           - We will arrange for outpatient GI follow up.                           - The findings and recommendations were discussed                            with the patient. Procedure Code(s):        --- Professional ---                           [phone], Colonoscopy, flexible; with removal of                            tumor(s), polyp(s), or other lesion(s) by snare                            technique  54619, 59, Colonoscopy, flexible; with biopsy,                            single or multiple Diagnosis Code(s):        --- Professional ---                           K64.8, Other hemorrhoids                           D12.2, Benign neoplasm of ascending colon                           K63.89, Other specified diseases of intestine                           K62.89, Other specified diseases of anus and rectum                           R10.9, Unspecified abdominal pain                           R19.7, Diarrhea, unspecified                           R93.3, Abnormal findings on diagnostic imaging of                            other parts of digestive tract CPT copyright 2022 [employer]. All rights reserved. The codes documented in this report are preliminary and upon coder review may  be revised to meet current compliance requirements. Dr Estefana Federico Rosario Estefana Federico,  12/12/2023 1:45:11 PM Number of Addenda: 0

## 2023-12-13 ENCOUNTER — Encounter (HOSPITAL_COMMUNITY): Payer: Self-pay | Admitting: Internal Medicine

## 2023-12-13 DIAGNOSIS — K529 Noninfective gastroenteritis and colitis, unspecified: Secondary | ICD-10-CM | POA: Diagnosis not present

## 2023-12-13 MED ORDER — DICYCLOMINE HCL 10 MG PO CAPS: 10.0000 mg | ORAL_CAPSULE | Freq: Three times a day (TID) | ORAL | 1 refills | Status: DC | PRN

## 2023-12-13 MED ORDER — DICYCLOMINE HCL 10 MG PO CAPS
10.0000 mg | ORAL_CAPSULE | Freq: Three times a day (TID) | ORAL | 1 refills | Status: DC | PRN
  Filled 2023-12-13: qty 30, 10d supply, fill #0

## 2023-12-13 MED ORDER — BISMUTH SUBSALICYLATE 262 MG PO CHEW: 524.0000 mg | CHEWABLE_TABLET | Freq: Four times a day (QID) | ORAL | 0 refills | Status: DC | PRN

## 2023-12-13 MED ORDER — POLYETHYLENE GLYCOL 3350 17 G PO PACK: 17.0000 g | PACK | Freq: Every day | ORAL | Status: DC | PRN

## 2023-12-13 MED ORDER — ONDANSETRON HCL 4 MG PO TABS: 4.0000 mg | ORAL_TABLET | Freq: Three times a day (TID) | ORAL | 0 refills | Status: DC | PRN

## 2023-12-13 MED ORDER — OXYCODONE HCL 5 MG PO TABS: 5.0000 mg | ORAL_TABLET | ORAL | 0 refills | Status: DC | PRN

## 2023-12-13 NOTE — Plan of Care (Signed)

## 2023-12-13 NOTE — Discharge Summary (Signed)
 Physician Discharge Summary  Holly Brown FMW:[id number] DOB: 1985/08/03 DOA: 12/08/2023  PCP: Corwin Antu, FNP  Admit date: 12/08/2023 Discharge date: 12/13/2023  Admitted From: Home Disposition: Home  Recommendations for Outpatient Follow-up:  Follow up with PCP in 1 week  Outpatient follow-up with GI Follow up in ED if symptoms worsen or new appear   Home Health: No Equipment/Devices: None  Discharge Condition: Stable CODE STATUS: Full Diet recommendation: Regular  Brief/Interim Summary: 38 year old female with history of cervical cancer status post hysterectomy, duodenal ulcer, recent admission in 10/2023 with colitis treated with antibiotics with plans for colonoscopy by [facility] as an outpatient presented with abdominal pain, nausea, vomiting and diarrhea. On presentation, there is mild leukocytosis with lactic acidosis with imaging concerning for colitis. She was started on IV antibiotics. GI was consulted.  She underwent colonoscopy on 12/12/2023 by GI and biopsies were taken.  GI has subsequently signed off and cleared the patient for discharge.  Outpatient follow-up with GI.  Patient is still having intermittent nausea and cramping but feels okay to be discharged.  Discharge patient home today with outpatient follow-up with PCP and GI.  Discharge Diagnoses:   Colitis -Patient with recent admission in 10/2023 with colitis and treated with antibiotics with plans for colonoscopy by [facility] as an outpatient  - Presented again with findings concerning for colitis.  Treated with IV Zosyn .  -Stool for C. difficile and GI PCR negative. -  She underwent colonoscopy on 12/12/2023 by GI: 1 ascending colon polyp erythematous mucosa in the ascending colon and rectum biopsied; nonbleeding internal hemorrhoids; GI recommends as needed Pepto-Bismol for diarrhea and Bentyl  3 times daily as needed for abdominal pain.  GI has cleared the patient for discharge and recommends outpatient  follow-up with GI.  GI recommended no need for any more antibiotics.  Antibiotics discontinued on 12/12/2023. -Patient is still having intermittent nausea and cramping but feels okay to be discharged.  Discharge patient home today with outpatient follow-up with PCP and GI.  E coli UTI: present on admission - Treated with 4 days of IV antibiotics.   Leukocytosis - Resolved   Hypokalemia - Resolved   Lactic acidosis--resolved   Obesity class I -Outpatient follow-up  Normocytic anemia -Questionable cause.  Hemoglobin stable.  No signs of bleeding.  Monitor intermittently as an outpatient.   Discharge Instructions  Discharge Instructions     Ambulatory referral to Gastroenterology   Complete by: As directed    Hospital followup   What is the reason for referral?: Other   Diet general   Complete by: As directed    Increase activity slowly   Complete by: As directed       Allergies as of 12/13/2023       Reactions   Keflex  [cephalexin ] Itching        Medication List     STOP taking these medications    omeprazole  40 MG capsule Commonly known as: PRILOSEC       TAKE these medications    acetaminophen  500 MG tablet Commonly known as: TYLENOL  Take 1,000 mg by mouth every 6 (six) hours as needed for moderate pain (pain score 4-6), fever or headache.   bismuth  subsalicylate 262 MG chewable tablet Commonly known as: PEPTO BISMOL Chew 2 tablets (524 mg total) by mouth 4 (four) times daily as needed for diarrhea or loose stools.   dicyclomine  10 MG capsule Commonly known as: BENTYL  Take 1 capsule (10 mg total) by mouth 3 (three) times daily  as needed for spasms.   omeprazole  20 MG tablet Commonly known as: PRILOSEC OTC Take 20 mg by mouth daily.   ondansetron  4 MG tablet Commonly known as: Zofran  Take 1 tablet (4 mg total) by mouth every 8 (eight) hours as needed for nausea or vomiting.   oxyCODONE  5 MG immediate release tablet Commonly known as: Oxy  IR/ROXICODONE  Take 1 tablet (5 mg total) by mouth every 4 (four) hours as needed for moderate pain (pain score 4-6).   polyethylene glycol 17 g packet Commonly known as: MIRALAX  / GLYCOLAX  Take 17 g by mouth daily as needed for moderate constipation. What changed:  when to take this reasons to take this        Follow-up Information     Corwin Antu, FNP. Schedule an appointment as soon as possible for a visit in 1 week(s).   Specialty: Family Medicine Contact information: [address] [place] [address] [address] [phone]                Allergies  Allergen Reactions   Keflex  [Cephalexin ] Itching    Consultations: GI   Procedures/Studies: CT ABDOMEN PELVIS W CONTRAST Result Date: 12/08/2023 CLINICAL DATA:  Left lower quadrant pain, nausea, vomiting, diarrhea EXAM: CT ABDOMEN AND PELVIS WITH CONTRAST TECHNIQUE: Multidetector CT imaging of the abdomen and pelvis was performed using the standard protocol following bolus administration of intravenous contrast. RADIATION DOSE REDUCTION: This exam was performed according to the departmental dose-optimization program which includes automated exposure control, adjustment of the mA and/or kV according to patient size and/or use of iterative reconstruction technique. CONTRAST:  OMNIPAQUE  IOHEXOL  300 MG/ML  SOLN COMPARISON:  10/25/2023 FINDINGS: Lower chest: No acute abnormality Hepatobiliary: Diffuse low-density throughout the liver compatible with fatty infiltration. No focal abnormality. Gallbladder unremarkable. Pancreas: No focal abnormality or ductal dilatation. Spleen: No focal abnormality.  Normal size. Adrenals/Urinary Tract: No adrenal abnormality. No focal renal abnormality. No stones or hydronephrosis. Urinary bladder is unremarkable. Stomach/Bowel: Stomach and small bowel decompressed. Colon is also decompressed but again appears thick walled suggesting colitis, similar to prior study. No bowel obstruction.  Vascular/Lymphatic: No evidence of aneurysm or adenopathy. Reproductive: Prior hysterectomy.  No adnexal masses. Other: No free fluid or free air. Musculoskeletal: No acute bony abnormality. IMPRESSION: Colon is decompressed and difficult evaluate, but appears to again be diffusely thick walled suggesting colitis. Recommend clinical correlation. Hepatic steatosis. Electronically Signed   By: Franky Crease M.D.   On: 12/08/2023 03:17   DG Chest Port 1 View Result Date: 12/08/2023 CLINICAL DATA:  Chest pain EXAM: PORTABLE CHEST 1 VIEW COMPARISON:  11/19/2023 FINDINGS: The heart size and mediastinal contours are within normal limits. Both lungs are clear. The visualized skeletal structures are unremarkable. No pneumothorax IMPRESSION: No active disease. Electronically Signed   By: Franky Crease M.D.   On: 12/08/2023 02:24   DG Chest 2 View Result Date: 11/19/2023 CLINICAL DATA:  Chest pain. EXAM: CHEST - 2 VIEW COMPARISON:  01/08/2023. FINDINGS: The heart size and mediastinal contours are within normal limits. Both lungs are clear. No pleural effusion or pneumothorax. The visualized skeletal structures are unremarkable. IMPRESSION: No acute cardiopulmonary findings. Electronically Signed   By: Harrietta Sherry M.D.   On: 11/19/2023 09:49   NM PET Image Initial (PI) Skull Base To Thigh (F-18 FDG) Result Date: 11/17/2023 CLINICAL DATA:  Subsequent treatment strategy for cervical cancer. History of prior surgery. EXAM: NUCLEAR MEDICINE PET SKULL BASE TO THIGH TECHNIQUE: 11.21 mCi F-18 FDG  was injected intravenously. Full-ring PET imaging was performed from the skull base to thigh after the radiotracer. CT data was obtained and used for attenuation correction and anatomic localization. Fasting blood glucose: 92 mg/dl COMPARISON:  MRI pelvis 11/01/2023. CT abdomen pelvis 10/25/2023. Older exams as well. FINDINGS: Mediastinal blood pool activity: SUV max 2.1 Liver activity: SUV max 3.0 NECK: Scattered brown fat uptake  along the neck particularly in the supraclavicular regions there also paraspinal. Otherwise no specific abnormal uptake identified above blood pool along lymph node change the neck including submandibular, posterior triangle or internal jugular region. Near symmetric uptake of the visualized intracranial compartment. There is some mild uptake along the mucosal thickening and opacity of the left maxillary sinus. Please correlate for any symptoms of sinusitis or other process. Incidental CT findings: Mastoid air cells are clear. The parotid glands, submandibular glands and thyroid gland are grossly preserved. CHEST: Mild brown fat areas of uptake along the mediastinum, left axillary region and paraspinal regions. No specific abnormal uptake above blood pool in the axillary region, hilum or mediastinum. No specific abnormal uptake identified along the lung parenchyma. Incidental CT findings: Slightly patulous thoracic esophagus. Heart is nonenlarged. No pericardial effusion. Normal caliber thoracic aorta. Mild breathing motion. No consolidation, pneumothorax or effusion. ABDOMEN/PELVIS: No abnormal hypermetabolic activity within the liver, pancreas, adrenal glands, or spleen. No hypermetabolic lymph nodes in the abdomen or pelvis. The lesion in question along left side of the vaginal cuff on the previous MRI does not show significant abnormal uptake on the PET-CT scan. Please correlate with clinical findings. On the previous MRI there is also a 2.4 cm left-sided ovarian complex lesion which may have been a hemorrhagic cyst. This has significant uptake of maximum SUV of 18.0. Recommend short follow-up ultrasound in 6 weeks. Incidental CT findings: Overall on this noncontrast attenuation correction limited CT, grossly the liver, spleen, adrenal glands and pancreas are unremarkable. No abnormal calcifications seen within either kidney nor along the course of either ureter. Underdistended urinary bladder. The stomach,  small bowel and large bowel are normal course and caliber. Scattered mild colonic stool. Normal appendix in the right lower quadrant. Gallbladder is present. Normal caliber aorta and IVC. SKELETON: No areas of abnormal uptake along the visualized osseous structures. Incidental CT findings: Partial fusion of vertebral bodies and lower cervical spine with slight degenerative change. IMPRESSION: No areas of abnormal radiotracer uptake at this time suggest soft tissue or lymph node metastatic disease. Specifically the nodular focus along the left side of the vaginal cuff on the previous MRI does not clearly show abnormal radiotracer uptake today. Please correlate with clinical findings and direct visualization. There is significant uptake within the previously described complex left-sided ovarian lesion. Again this could be a functional cyst although with this level uptake would recommend short follow up ultrasound to confirm resolution in 6 weeks. Scattered brown fat uptake. Mild uptake with opacity along left maxillary sinus. Please correlate for any specific symptoms and history. Electronically Signed   By: Ranell Bring M.D.   On: 11/17/2023 14:08   Colonoscopy on 12/12/2023 Impression:               - The examined portion of the ileum was normal.                           - One 3 mm polyp in the ascending colon, removed  with a cold snare. Resected and retrieved.                           - Erythematous mucosa in the ascending colon.                            Biopsied.                           - Erythematous mucosa in the rectum. Biopsied.                           - Non-bleeding internal hemorrhoids. Recommendation:           - Return patient to hospital ward for ongoing care.                           - Await pathology results.                           - Okay to use Pepto Bismol PRN for diarrhea.                           - Will start on Bentyl  10 mg TID PRN for ab pain.                            - We will arrange for outpatient GI follow up.                           - The findings and recommendations were discussed                            with the patient. Procedure Code(s):        --- Professional ---                           [phone], Colonoscopy, flexible; with removal of                            tumor(s), polyp(s), or other lesion(s) by snare                            technique                           [id number], 59, Colonoscopy, flexible; with biopsy,                            single or multiple  Subjective: Patient seen and examined at bedside.  Still having intermittent nausea with cramping but tolerating diet.  No fever reported.  Feels okay to go home today.  Discharge Exam: Vitals:   12/13/23 0027 12/13/23 0747  BP: (!) 106/55 (!) 95/48  Pulse: (!) 51 76  Resp: 18 20  Temp: (!) 97 F (36.1 C) 98.2 F (36.8 C)  SpO2: 98% 98%    General: Pt is alert, awake, not in  acute distress.  On room air. Cardiovascular: rate controlled, S1/S2 + Respiratory: bilateral decreased breath sounds at bases Abdominal: Soft, obese, mildly tender, ND, bowel sounds + Extremities: no edema, no cyanosis    The results of significant diagnostics from this hospitalization (including imaging, microbiology, ancillary and laboratory) are listed below for reference.     Microbiology: Recent Results (from the past 240 hours)  Urine Culture     Status: Abnormal   Collection Time: 12/08/23  2:24 AM   Specimen: Urine, Clean Catch  Result Value Ref Range Status   Specimen Description   Final    URINE, CLEAN CATCH Performed at [facility], [address] [place], [place] [address]    Special Requests   Final    NONE Performed at [facility], [address] [place], [place] [address]    Culture >=100,000 COLONIES/mL ESCHERICHIA COLI (A)  Final   Report Status 12/10/2023 FINAL  Final   Organism ID, Bacteria ESCHERICHIA COLI (A)   Final      Susceptibility   Escherichia coli - MIC*    AMPICILLIN <=2 SENSITIVE Sensitive     CEFAZOLIN  (URINE) Value in next row Sensitive      <=1 SENSITIVEThis is a modified FDA-approved test that has been validated and its performance characteristics determined by the reporting laboratory.  This laboratory is certified under the Clinical Laboratory Improvement Amendments CLIA as qualified to perform high complexity clinical laboratory testing.    CEFEPIME Value in next row Sensitive      <=1 SENSITIVEThis is a modified FDA-approved test that has been validated and its performance characteristics determined by the reporting laboratory.  This laboratory is certified under the Clinical Laboratory Improvement Amendments CLIA as qualified to perform high complexity clinical laboratory testing.    ERTAPENEM Value in next row Sensitive      <=1 SENSITIVEThis is a modified FDA-approved test that has been validated and its performance characteristics determined by the reporting laboratory.  This laboratory is certified under the Clinical Laboratory Improvement Amendments CLIA as qualified to perform high complexity clinical laboratory testing.    CEFTRIAXONE  Value in next row Sensitive      <=1 SENSITIVEThis is a modified FDA-approved test that has been validated and its performance characteristics determined by the reporting laboratory.  This laboratory is certified under the Clinical Laboratory Improvement Amendments CLIA as qualified to perform high complexity clinical laboratory testing.    CIPROFLOXACIN  Value in next row Sensitive      <=1 SENSITIVEThis is a modified FDA-approved test that has been validated and its performance characteristics determined by the reporting laboratory.  This laboratory is certified under the Clinical Laboratory Improvement Amendments CLIA as qualified to perform high complexity clinical laboratory testing.    GENTAMICIN Value in next row Sensitive      <=1 SENSITIVEThis  is a modified FDA-approved test that has been validated and its performance characteristics determined by the reporting laboratory.  This laboratory is certified under the Clinical Laboratory Improvement Amendments CLIA as qualified to perform high complexity clinical laboratory testing.    NITROFURANTOIN  Value in next row Sensitive      <=1 SENSITIVEThis is a modified FDA-approved test that has been validated and its performance characteristics determined by the reporting laboratory.  This laboratory is certified under the Clinical Laboratory Improvement Amendments CLIA as qualified to perform high complexity clinical laboratory testing.    TRIMETH /SULFA  Value in next row Sensitive      <=1  SENSITIVEThis is a modified FDA-approved test that has been validated and its performance characteristics determined by the reporting laboratory.  This laboratory is certified under the Clinical Laboratory Improvement Amendments CLIA as qualified to perform high complexity clinical laboratory testing.    AMPICILLIN/SULBACTAM Value in next row Sensitive      <=1 SENSITIVEThis is a modified FDA-approved test that has been validated and its performance characteristics determined by the reporting laboratory.  This laboratory is certified under the Clinical Laboratory Improvement Amendments CLIA as qualified to perform high complexity clinical laboratory testing.    PIP/TAZO Value in next row Sensitive ug/mL     <=4 SENSITIVEThis is a modified FDA-approved test that has been validated and its performance characteristics determined by the reporting laboratory.  This laboratory is certified under the Clinical Laboratory Improvement Amendments CLIA as qualified to perform high complexity clinical laboratory testing.    MEROPENEM Value in next row Sensitive      <=4 SENSITIVEThis is a modified FDA-approved test that has been validated and its performance characteristics determined by the reporting laboratory.  This laboratory  is certified under the Clinical Laboratory Improvement Amendments CLIA as qualified to perform high complexity clinical laboratory testing.    * >=100,000 COLONIES/mL ESCHERICHIA COLI  Gastrointestinal Panel by PCR , Stool     Status: None   Collection Time: 12/10/23 12:30 PM   Specimen: Stool  Result Value Ref Range Status   Campylobacter species NOT DETECTED NOT DETECTED Final   Plesimonas shigelloides NOT DETECTED NOT DETECTED Final   Salmonella species NOT DETECTED NOT DETECTED Final   Yersinia enterocolitica NOT DETECTED NOT DETECTED Final   Vibrio species NOT DETECTED NOT DETECTED Final   Vibrio cholerae NOT DETECTED NOT DETECTED Final   Enteroaggregative E coli (EAEC) NOT DETECTED NOT DETECTED Final   Enteropathogenic E coli (EPEC) NOT DETECTED NOT DETECTED Final   Enterotoxigenic E coli (ETEC) NOT DETECTED NOT DETECTED Final   Shiga like toxin producing E coli (STEC) NOT DETECTED NOT DETECTED Final   Shigella/Enteroinvasive E coli (EIEC) NOT DETECTED NOT DETECTED Final   Cryptosporidium NOT DETECTED NOT DETECTED Final   Cyclospora cayetanensis NOT DETECTED NOT DETECTED Final   Entamoeba histolytica NOT DETECTED NOT DETECTED Final   Giardia lamblia NOT DETECTED NOT DETECTED Final   Adenovirus F40/41 NOT DETECTED NOT DETECTED Final   Astrovirus NOT DETECTED NOT DETECTED Final   Norovirus GI/GII NOT DETECTED NOT DETECTED Final   Rotavirus A NOT DETECTED NOT DETECTED Final   Sapovirus (I, II, IV, and V) NOT DETECTED NOT DETECTED Final    Comment: Performed at [facility] [address] [place], [place] [address]  C Difficile Quick Screen w PCR reflex     Status: None   Collection Time: 12/10/23 12:30 PM   Specimen: STOOL  Result Value Ref Range Status   C Diff antigen NEGATIVE NEGATIVE Final   C Diff toxin NEGATIVE NEGATIVE Final   C Diff interpretation No C. difficile detected.  Final    Comment: Performed at [facility], [address], [place], [place]  [address]     Labs: BNP (last 3 results) Recent Labs    11/19/23 0857  BNP 20.9   Basic Metabolic Panel: Recent Labs  Lab 12/08/23 0206 12/08/23 1528 12/09/23 0142 12/10/23 0148 12/11/23 0145  NA 142  --  137 140 139  K 3.6  --  3.1* 3.2* 3.5  CL 106  --  103 99 104  CO2 24  --  24 27 23   GLUCOSE 146*  --  102* 93 96  BUN 14  --  5* <5* <5*  CREATININE 1.07* 0.99 0.91 1.02* 1.06*  CALCIUM 9.5  --  8.4* 8.4* 8.4*  MG  --   --   --  1.9 2.0   Liver Function Tests: Recent Labs  Lab 12/08/23 0206 12/11/23 0145  AST 16 34  ALT 9 38  ALKPHOS 67 47  BILITOT 0.5 1.2  PROT 7.0 5.6*  ALBUMIN 4.2 2.9*   Recent Labs  Lab 12/08/23 0206  LIPASE 17   No results for input(s): AMMONIA in the last 168 hours. CBC: Recent Labs  Lab 12/08/23 0206 12/08/23 1528 12/09/23 0142 12/10/23 0148 12/11/23 0145  WBC 13.3* 11.4* 8.2 8.2 7.3  NEUTROABS 9.8*  --   --  4.7 3.9  HGB 12.7 11.9* 11.2* 11.6* 11.6*  HCT 38.6 36.2 34.3* 35.4* 34.5*  MCV 93.2 94.0 94.0 94.7 92.5  PLT 370 339 322 331 287   Cardiac Enzymes: No results for input(s): CKTOTAL, CKMB, CKMBINDEX, TROPONINI in the last 168 hours. BNP: Invalid input(s): POCBNP CBG: No results for input(s): GLUCAP in the last 168 hours. D-Dimer No results for input(s): DDIMER in the last 72 hours. Hgb A1c No results for input(s): HGBA1C in the last 72 hours. Lipid Profile No results for input(s): CHOL, HDL, LDLCALC, TRIG, CHOLHDL, LDLDIRECT in the last 72 hours. Thyroid function studies No results for input(s): TSH, T4TOTAL, T3FREE, THYROIDAB in the last 72 hours.  Invalid input(s): FREET3 Anemia work up No results for input(s): VITAMINB12, FOLATE, FERRITIN, TIBC, IRON, RETICCTPCT in the last 72 hours. Urinalysis    Component Value Date/Time   COLORURINE YELLOW 12/08/2023 0224   APPEARANCEUR CLOUDY (A) 12/08/2023 0224   LABSPEC 1.020 12/08/2023 0224   PHURINE 7.0  12/08/2023 0224   GLUCOSEU 250 (A) 12/08/2023 0224   HGBUR TRACE (A) 12/08/2023 0224   BILIRUBINUR NEGATIVE 12/08/2023 0224   BILIRUBINUR Negative 09/04/2023 0955   KETONESUR NEGATIVE 12/08/2023 0224   PROTEINUR NEGATIVE 12/08/2023 0224   UROBILINOGEN 0.2 09/04/2023 0955   UROBILINOGEN 0.2 01/04/2018 1129   NITRITE POSITIVE (A) 12/08/2023 0224   LEUKOCYTESUR NEGATIVE 12/08/2023 0224   Sepsis Labs Recent Labs  Lab 12/08/23 1528 12/09/23 0142 12/10/23 0148 12/11/23 0145  WBC 11.4* 8.2 8.2 7.3   Microbiology Recent Results (from the past 240 hours)  Urine Culture     Status: Abnormal   Collection Time: 12/08/23  2:24 AM   Specimen: Urine, Clean Catch  Result Value Ref Range Status   Specimen Description   Final    URINE, CLEAN CATCH Performed at [facility], [address] [place], [place] [address]    Special Requests   Final    NONE Performed at [facility], [address] [place], [place] [address]    Culture >=100,000 COLONIES/mL ESCHERICHIA COLI (A)  Final   Report Status 12/10/2023 FINAL  Final   Organism ID, Bacteria ESCHERICHIA COLI (A)  Final      Susceptibility   Escherichia coli - MIC*    AMPICILLIN <=2 SENSITIVE Sensitive     CEFAZOLIN  (URINE) Value in next row Sensitive      <=1 SENSITIVEThis is a modified FDA-approved test that has been validated and its performance characteristics determined by the reporting laboratory.  This laboratory is certified under the Clinical Laboratory Improvement Amendments CLIA as qualified to perform high complexity clinical laboratory testing.    CEFEPIME  Value in next row Sensitive      <=1 SENSITIVEThis is a modified FDA-approved test that has been validated and its performance characteristics determined by the reporting laboratory.  This laboratory is certified under the Clinical Laboratory Improvement Amendments CLIA as qualified to perform high complexity clinical laboratory testing.    ERTAPENEM  Value in next row Sensitive      <=1 SENSITIVEThis is a modified FDA-approved test that has been validated and its performance characteristics determined by the reporting laboratory.  This laboratory is certified under the Clinical Laboratory Improvement Amendments CLIA as qualified to perform high complexity clinical laboratory testing.    CEFTRIAXONE  Value in next row Sensitive      <=1 SENSITIVEThis is a modified FDA-approved test that has been validated and its performance characteristics determined by the reporting laboratory.  This laboratory is certified under the Clinical Laboratory Improvement Amendments CLIA as qualified to perform high complexity clinical laboratory testing.    CIPROFLOXACIN  Value in next row Sensitive      <=1 SENSITIVEThis is a modified FDA-approved test that has been validated and its performance characteristics determined by the reporting laboratory.  This laboratory is certified under the Clinical Laboratory Improvement Amendments CLIA as qualified to perform high complexity clinical laboratory testing.    GENTAMICIN Value in next row Sensitive      <=1 SENSITIVEThis is a modified FDA-approved test that has been validated and its performance characteristics determined by the reporting laboratory.  This laboratory is certified under the Clinical Laboratory Improvement Amendments CLIA as qualified to perform high complexity clinical laboratory testing.    NITROFURANTOIN  Value in next row Sensitive      <=1 SENSITIVEThis is a modified FDA-approved test that has been validated and its performance characteristics determined by the reporting laboratory.  This laboratory is certified under the Clinical Laboratory Improvement Amendments CLIA as qualified to perform high complexity clinical laboratory testing.    TRIMETH /SULFA  Value in next row Sensitive      <=1 SENSITIVEThis is a modified FDA-approved test that has been validated and its performance characteristics determined by  the reporting laboratory.  This laboratory is certified under the Clinical Laboratory Improvement Amendments CLIA as qualified to perform high complexity clinical laboratory testing.    AMPICILLIN/SULBACTAM Value in next row Sensitive      <=1 SENSITIVEThis is a modified FDA-approved test that has been validated and its performance characteristics determined by the reporting laboratory.  This laboratory is certified under the Clinical Laboratory Improvement Amendments CLIA as qualified to perform high complexity clinical laboratory testing.    PIP/TAZO Value in next row Sensitive ug/mL     <=4 SENSITIVEThis is a modified FDA-approved test that has been validated and its performance characteristics determined by the reporting laboratory.  This laboratory is certified under the Clinical Laboratory Improvement Amendments CLIA as qualified to perform high complexity clinical laboratory testing.    MEROPENEM Value in next row Sensitive      <=4 SENSITIVEThis is a modified FDA-approved test that has been validated and its performance characteristics determined by the reporting laboratory.  This laboratory is certified under the Clinical Laboratory Improvement Amendments CLIA as qualified to perform high complexity clinical laboratory testing.    * >=100,000 COLONIES/mL ESCHERICHIA COLI  Gastrointestinal Panel by PCR , Stool     Status: None   Collection Time: 12/10/23 12:30 PM   Specimen: Stool  Result Value Ref Range Status   Campylobacter species NOT DETECTED NOT DETECTED Final   Plesimonas shigelloides NOT  DETECTED NOT DETECTED Final   Salmonella species NOT DETECTED NOT DETECTED Final   Yersinia enterocolitica NOT DETECTED NOT DETECTED Final   Vibrio species NOT DETECTED NOT DETECTED Final   Vibrio cholerae NOT DETECTED NOT DETECTED Final   Enteroaggregative E coli (EAEC) NOT DETECTED NOT DETECTED Final   Enteropathogenic E coli (EPEC) NOT DETECTED NOT DETECTED Final   Enterotoxigenic E coli (ETEC)  NOT DETECTED NOT DETECTED Final   Shiga like toxin producing E coli (STEC) NOT DETECTED NOT DETECTED Final   Shigella/Enteroinvasive E coli (EIEC) NOT DETECTED NOT DETECTED Final   Cryptosporidium NOT DETECTED NOT DETECTED Final   Cyclospora cayetanensis NOT DETECTED NOT DETECTED Final   Entamoeba histolytica NOT DETECTED NOT DETECTED Final   Giardia lamblia NOT DETECTED NOT DETECTED Final   Adenovirus F40/41 NOT DETECTED NOT DETECTED Final   Astrovirus NOT DETECTED NOT DETECTED Final   Norovirus GI/GII NOT DETECTED NOT DETECTED Final   Rotavirus A NOT DETECTED NOT DETECTED Final   Sapovirus (I, II, IV, and V) NOT DETECTED NOT DETECTED Final    Comment: Performed at [facility], [address] [place], [place] [address]  C Difficile Quick Screen w PCR reflex     Status: None   Collection Time: 12/10/23 12:30 PM   Specimen: STOOL  Result Value Ref Range Status   C Diff antigen NEGATIVE NEGATIVE Final   C Diff toxin NEGATIVE NEGATIVE Final   C Diff interpretation No C. difficile detected.  Final    Comment: Performed at [facility], [address], [place], [place] [address]     Time coordinating discharge: 35 minutes  SIGNED:   Sophie Mao, MD  [facility] 12/13/2023, 9:40 AM

## 2023-12-13 NOTE — Progress Notes (Signed)
 Pt discharged to home. Left unit in wheelchair pushed by this Clinical research associate. Left in stable condition.

## 2023-12-13 NOTE — Progress Notes (Addendum)
 Discharge instructions given. Pt verbalized understanding. Verbalized a concern regarding primary pharmacy not having dicyclomine  in stock and asked for prescription to be sent to [facility]. Dr. Cheryle made aware. MD to send dicyclomine  prescription to [facility].

## 2023-12-14 ENCOUNTER — Ambulatory Visit

## 2023-12-14 ENCOUNTER — Telehealth: Payer: Self-pay | Admitting: *Deleted

## 2023-12-14 ENCOUNTER — Other Ambulatory Visit (HOSPITAL_COMMUNITY): Payer: Self-pay

## 2023-12-14 NOTE — Telephone Encounter (Signed)
-----   Message from Vina Dasen sent at 12/12/2023  1:43 PM EDT ----- RICK All,  Patient recently seen in clinic and scheduled for colonoscopy to evaluate ongoing diarrhea and questionable colitis on CT scan.  She ended up back in the hospital with the same.  We proceeded with colonoscopy.  Stool studies were negative .  Colonoscopy basically unremarkable except for some erythema in the rectum but biopsies are pending.    Please remove her from endoscopy schedule.  I will contact Bari Lesches to get this patient a hospital follow-up appointment with [facility].   Thanks.   Pg

## 2023-12-14 NOTE — Telephone Encounter (Signed)
 Colonoscopy previously cancelled for [facility].

## 2023-12-16 ENCOUNTER — Ambulatory Visit: Payer: Self-pay | Admitting: Internal Medicine

## 2023-12-16 ENCOUNTER — Ambulatory Visit
Admission: RE | Admit: 2023-12-16 | Discharge: 2023-12-16 | Disposition: A | Discharge status: Home / Self Care | Source: Ambulatory Visit | Attending: Family | Admitting: Family

## 2023-12-16 ENCOUNTER — Ambulatory Visit: Admitting: Family

## 2023-12-16 ENCOUNTER — Encounter: Payer: Self-pay | Admitting: Family

## 2023-12-16 VITALS — BP 100/60 | HR 72 | Temp 97.7°F | Resp 20 | Ht 70.0 in | Wt 221.6 lb

## 2023-12-16 DIAGNOSIS — R197 Diarrhea, unspecified: Secondary | ICD-10-CM

## 2023-12-16 DIAGNOSIS — K449 Diaphragmatic hernia without obstruction or gangrene: Secondary | ICD-10-CM | POA: Diagnosis not present

## 2023-12-16 DIAGNOSIS — M62838 Other muscle spasm: Secondary | ICD-10-CM

## 2023-12-16 DIAGNOSIS — R6889 Other general symptoms and signs: Secondary | ICD-10-CM | POA: Diagnosis not present

## 2023-12-16 DIAGNOSIS — M25561 Pain in right knee: Secondary | ICD-10-CM

## 2023-12-16 DIAGNOSIS — H6121 Impacted cerumen, right ear: Secondary | ICD-10-CM | POA: Diagnosis not present

## 2023-12-16 DIAGNOSIS — M25551 Pain in right hip: Secondary | ICD-10-CM

## 2023-12-16 DIAGNOSIS — Z7689 Persons encountering health services in other specified circumstances: Secondary | ICD-10-CM | POA: Diagnosis not present

## 2023-12-16 DIAGNOSIS — K644 Residual hemorrhoidal skin tags: Secondary | ICD-10-CM

## 2023-12-16 DIAGNOSIS — G8929 Other chronic pain: Secondary | ICD-10-CM

## 2023-12-16 DIAGNOSIS — M1711 Unilateral primary osteoarthritis, right knee: Secondary | ICD-10-CM | POA: Diagnosis not present

## 2023-12-16 DIAGNOSIS — R110 Nausea: Secondary | ICD-10-CM | POA: Diagnosis not present

## 2023-12-16 LAB — SURGICAL PATHOLOGY

## 2023-12-16 NOTE — Patient Instructions (Signed)
-   Please get right hip and Knee  X-ray at [facility] imaging at [facility] then will call you with results.

## 2023-12-16 NOTE — Progress Notes (Signed)
 Provider: Roxan Plough FNP-C   Darald Uzzle, [name], NP  Patient Care Team: Warner Laduca, [name], NP as PCP - General (Family Medicine)  Extended Emergency Contact Information Primary Emergency Contact: Sedalia Zada [place] Home Phone: [phone] Mobile Phone: [phone] Relation: Sister Secondary Emergency Contact: Jarold Lima Address: [address]          [place], [place] [address] [place] Home Phone: [phone] Relation: Mother  Code Status:  Full Code  Goals of care: Advanced Directive information    12/16/2023    9:15 AM  Advanced Directives  Does Patient Have a Medical Advance Directive? No  Would patient like information on creating a medical advance directive? No - Patient declined     Chief Complaint  Patient presents with   Establish Care    New Patient Appointment      Discussed the use of AI scribe software for clinical note transcription with the patient, who gave verbal consent to proceed.  History of Present Illness   Holly Brown is a 38 year old female who presents with abdominal pain and gastrointestinal symptoms following a hysterectomy.  She underwent a hysterectomy in August of last year, retaining her ovaries. Since the procedure, she has experienced persistent abdominal pain, recently diagnosed as colitis during a hospitalization. The pain is cramping in nature, and she is uncertain of the cause.  She experiences episodes of diarrhea and vomiting, which last for a day before she seeks hospital care. She tolerates pain for two to three days before seeking medical attention due to her aversion to hospitals. Extra strength Tylenol  and ibuprofen  have been ineffective in managing her symptoms.  Her blood pressure fluctuates, often presenting as low, particularly during episodes of diarrhea. She reports a decreased appetite, feeling 'scared to eat' due to uncertainty about which foods are safe, and has been  consuming minimal food, focusing on staying hydrated.  She has a history of a duodenal ulcer, with an endoscopy last year revealing no ulcers but a small hernia near the esophagus. She takes omeprazole  for acid reflux and has been prescribed Pepto Bismol following a recent colonoscopy. She also takes Miralax  daily but avoids it during episodes of diarrhea.  She experiences right knee pain, occasionally accompanied by swelling and a grinding sensation, but has not had any imaging done. Muscle spasms affect her ability to move her leg, sometimes starting in the hip and radiating down the back of the thigh.  Her current medications include Tylenol , omeprazole , Pepto Bismol as needed, Miralax , Zofran , oxycodone  5 mg every four hours as needed, and dicyclomine  for abdominal pain. She took oxycodone  for the first time since her hospital discharge yesterday.  She has a history of rectal bleeding associated with constipation a few months ago, which has since resolved. No current nausea, though she occasionally feels the urge to vomit without doing so. Occasional tingling in the left breast.    Past Medical History:  Diagnosis Date   Anxiety    Depression    Fatty liver    Gestational diabetes    metformin  - resolved after delivery   Hiatal hernia    History of UTI    HSV (herpes simplex virus) infection    Migraines    Peripheral vascular disease (HCC)    Post-operative nausea and vomiting 12/19/2022   Seizures (HCC)    Jan.2016 - only had one seizure, none since   Sleep apnea    Vaginal Pap smear, abnormal  Past Surgical History:  Procedure Laterality Date   ABDOMINAL HYSTERECTOMY  12/16/2022   BIOPSY  12/24/2022   Procedure: BIOPSY;  Surgeon: Leigh Elspeth SQUIBB, MD;  Location: [facility] ENDOSCOPY;  Service: Gastroenterology;;   BONE BIOPSY  12/12/2023   Procedure: BIOPSY, GI;  Surgeon: Federico Rosario BROCKS, MD;  Location: [facility] ENDOSCOPY;  Service: Gastroenterology;;   COLONOSCOPY N/A 12/12/2023    Procedure: COLONOSCOPY;  Surgeon: Federico Rosario BROCKS, MD;  Location: [facility] ENDOSCOPY;  Service: Gastroenterology;  Laterality: N/A;   DILATION AND CURETTAGE OF UTERUS     ESOPHAGOGASTRODUODENOSCOPY (EGD) WITH PROPOFOL  N/A 12/24/2022   Procedure: ESOPHAGOGASTRODUODENOSCOPY (EGD) WITH PROPOFOL ;  Surgeon: Leigh Elspeth SQUIBB, MD;  Location: [facility] ENDOSCOPY;  Service: Gastroenterology;  Laterality: N/A;   LAPAROSCOPIC TUBAL LIGATION Bilateral 12/08/2018   Procedure: LAPAROSCOPIC TUBAL LIGATION;  Surgeon: Alger Gong, MD;  Location: [facility];  Service: Gynecology;  Laterality: Bilateral;   LEEP N/A 03/18/2022   Procedure: LOOP ELECTROSURGICAL EXCISION PROCEDURE (LEEP);  Surgeon: Alger Gong, MD;  Location: [facility] OR;  Service: Gynecology;  Laterality: N/A;   POLYPECTOMY  12/12/2023   Procedure: POLYPECTOMY, INTESTINE;  Surgeon: Federico Rosario BROCKS, MD;  Location: [facility] ENDOSCOPY;  Service: Gastroenterology;;   TUBAL LIGATION     VAGINAL HYSTERECTOMY N/A 12/16/2022   Procedure: TOTAL VAGINAL HYSTERECTOMY, BILATERAL SALPINGECTOMY;  Surgeon: Lorence Ozell CROME, MD;  Location: [facility] OR;  Service: Gynecology;  Laterality: N/A;   WISDOM TOOTH EXTRACTION  04/21/2009    Allergies  Allergen Reactions   Keflex  [Cephalexin ] Itching    Allergies as of 12/16/2023       Reactions   Keflex  [cephalexin ] Itching        Medication List        Accurate as of December 16, 2023 10:11 AM. If you have any questions, ask your nurse or doctor.          acetaminophen  500 MG tablet Commonly known as: TYLENOL  Take 1,000 mg by mouth every 6 (six) hours as needed for moderate pain (pain score 4-6), fever or headache.   bismuth  subsalicylate 262 MG chewable tablet Commonly known as: PEPTO BISMOL Chew 2 tablets (524 mg total) by mouth 4 (four) times daily as needed for diarrhea or loose stools.   dicyclomine  10 MG capsule Commonly known as: BENTYL  Take 1 capsule (10 mg total) by mouth 3 (three) times daily as  needed for spasms.   omeprazole  20 MG tablet Commonly known as: PRILOSEC OTC Take 20 mg by mouth daily.   ondansetron  4 MG tablet Commonly known as: Zofran  Take 1 tablet (4 mg total) by mouth every 8 (eight) hours as needed for nausea or vomiting.   oxyCODONE  5 MG immediate release tablet Commonly known as: Oxy IR/ROXICODONE  Take 1 tablet (5 mg total) by mouth every 4 (four) hours as needed for moderate pain (pain score 4-6).   polyethylene glycol 17 g packet Commonly known as: MIRALAX  / GLYCOLAX  Take 17 g by mouth daily as needed for moderate constipation.        Review of Systems  Constitutional:  Negative for appetite change, chills, fatigue, fever and unexpected weight change.  HENT:  Negative for congestion, dental problem, ear discharge, ear pain, facial swelling, hearing loss, nosebleeds, postnasal drip, rhinorrhea, sinus pressure, sinus pain, sneezing, sore throat, tinnitus and trouble swallowing.   Eyes:  Negative for pain, discharge, redness, itching and visual disturbance.  Respiratory:  Negative for cough, chest tightness, shortness of breath and wheezing.   Cardiovascular:  Negative for chest  pain, palpitations and leg swelling.  Gastrointestinal:  Positive for nausea. Negative for abdominal distention, abdominal pain, blood in stool, constipation, diarrhea and vomiting.       Diarrhea and constipation at times   Endocrine: Negative for cold intolerance, heat intolerance, polydipsia, polyphagia and polyuria.  Genitourinary:  Negative for difficulty urinating, dysuria, flank pain, frequency and urgency.  Musculoskeletal:  Positive for arthralgias. Negative for back pain, gait problem, joint swelling, myalgias, neck pain and neck stiffness.       Right hip and knee pain   Skin:  Negative for color change, pallor, rash and wound.  Neurological:  Negative for dizziness, syncope, speech difficulty, weakness, light-headedness, numbness and headaches.  Hematological:  Does  not bruise/bleed easily.  Psychiatric/Behavioral:  Negative for agitation, behavioral problems, confusion, hallucinations, self-injury, sleep disturbance and suicidal ideas. The patient is not nervous/anxious.     Immunization History  Administered Date(s) Administered   HPV 9-valent 06/22/2017, 08/19/2017   Influenza, Seasonal, Injecte, Preservative Fre 03/25/2023   Influenza,inj,Quad PF,6+ Mos 02/01/2018   Influenza-Unspecified 04/23/2017, 02/20/2018, 02/19/2019, 02/10/2022   Tdap 06/22/2018, 08/18/2023   Pertinent  Health Maintenance Due  Topic Date Due   INFLUENZA VACCINE  11/20/2023      05/08/2023    8:41 AM 07/24/2023    3:32 PM 10/16/2023    9:01 AM 10/30/2023   10:01 AM 12/16/2023    9:14 AM  Fall Risk  Falls in the past year?  0 1 0 0  Was there an injury with Fall? 1 0 1 0 0  Fall Risk Category Calculator  0 2 0 0  Patient at Risk for Falls Due to No Fall Risks No Fall Risks History of fall(s) No Fall Risks No Fall Risks  Fall risk Follow up Falls evaluation completed Falls evaluation completed Falls evaluation completed Falls evaluation completed Falls evaluation completed   Functional Status Survey:    Vitals:   12/16/23 0919  BP: 100/60  Pulse: 72  Resp: 20  Temp: 97.7 F (36.5 C)  SpO2: 92%  Weight: 221 lb 9.6 oz (100.5 kg)  Height: 5' 10 (1.778 m)   Body mass index is 31.8 kg/m. Physical Exam  VITALS: T- 97.7, P- 72, BP- 106/60, SaO2- 92% MEASUREMENTS: Weight- 221.6. GENERAL: Alert, cooperative, well developed, no acute distress HEENT: Normocephalic, normal oropharynx, moist mucous membranes, ears normal bilaterally, small amounts of cerumen removed using curette on right ear nose normal, eyes normal, no sinus tenderness NECK: Neck normal CHEST: Clear to auscultation bilaterally, no wheezes, rhonchi, or crackles, no chest wall tenderness CARDIOVASCULAR: Normal heart rate and rhythm, S1 and S2 normal without murmurs ABDOMEN: Soft, non-tender,  non-distended, without organomegaly, normal bowel sounds EXTREMITIES: No cyanosis or edema, no extremity pain, right knee pop noted NEUROLOGICAL: Cranial nerves grossly intact, moves all extremities without gross motor or sensory deficit SKIN: Mole on right side unchanged  SKIN: No rash,no lesion or erythema   PSYCHIATRY/BEHAVIORAL: Mood stable    Labs reviewed: Recent Labs    04/07/23 0911 07/24/23 1554 12/09/23 0142 12/10/23 0148 12/11/23 0145  NA  --    < > 137 140 139  K  --    < > 3.1* 3.2* 3.5  CL  --    < > 103 99 104  CO2  --    < > 24 27 23   GLUCOSE  --    < > 102* 93 96  BUN  --    < > 5* <5* <5*  CREATININE  --    < >  0.91 1.02* 1.06*  CALCIUM  --    < > 8.4* 8.4* 8.4*  MG 1.9  --   --  1.9 2.0   < > = values in this interval not displayed.   Recent Labs    11/19/23 0857 12/08/23 0206 12/11/23 0145  AST 17 16 34  ALT 12 9 38  ALKPHOS 65 67 47  BILITOT 1.3* 0.5 1.2  PROT 7.6 7.0 5.6*  ALBUMIN 3.7 4.2 2.9*   Recent Labs    12/08/23 0206 12/08/23 1528 12/09/23 0142 12/10/23 0148 12/11/23 0145  WBC 13.3*   < > 8.2 8.2 7.3  NEUTROABS 9.8*  --   --  4.7 3.9  HGB 12.7   < > 11.2* 11.6* 11.6*  HCT 38.6   < > 34.3* 35.4* 34.5*  MCV 93.2   < > 94.0 94.7 92.5  PLT 370   < > 322 331 287   < > = values in this interval not displayed.   Lab Results  Component Value Date   TSH 1.190 10/10/2015   No results found for: HGBA1C Lab Results  Component Value Date   CHOL 154 04/07/2023   HDL 45.20 04/07/2023   LDLCALC 88 04/07/2023   TRIG 103.0 04/07/2023   CHOLHDL 3 04/07/2023    Significant Diagnostic Results in last 30 days:  CT ABDOMEN PELVIS W CONTRAST Result Date: 12/08/2023 CLINICAL DATA:  Left lower quadrant pain, nausea, vomiting, diarrhea EXAM: CT ABDOMEN AND PELVIS WITH CONTRAST TECHNIQUE: Multidetector CT imaging of the abdomen and pelvis was performed using the standard protocol following bolus administration of intravenous contrast. RADIATION  DOSE REDUCTION: This exam was performed according to the departmental dose-optimization program which includes automated exposure control, adjustment of the mA and/or kV according to patient size and/or use of iterative reconstruction technique. CONTRAST:  OMNIPAQUE  IOHEXOL  300 MG/ML  SOLN COMPARISON:  10/25/2023 FINDINGS: Lower chest: No acute abnormality Hepatobiliary: Diffuse low-density throughout the liver compatible with fatty infiltration. No focal abnormality. Gallbladder unremarkable. Pancreas: No focal abnormality or ductal dilatation. Spleen: No focal abnormality.  Normal size. Adrenals/Urinary Tract: No adrenal abnormality. No focal renal abnormality. No stones or hydronephrosis. Urinary bladder is unremarkable. Stomach/Bowel: Stomach and small bowel decompressed. Colon is also decompressed but again appears thick walled suggesting colitis, similar to prior study. No bowel obstruction. Vascular/Lymphatic: No evidence of aneurysm or adenopathy. Reproductive: Prior hysterectomy.  No adnexal masses. Other: No free fluid or free air. Musculoskeletal: No acute bony abnormality. IMPRESSION: Colon is decompressed and difficult evaluate, but appears to again be diffusely thick walled suggesting colitis. Recommend clinical correlation. Hepatic steatosis. Electronically Signed   By: Franky Crease M.D.   On: 12/08/2023 03:17   DG Chest Port 1 View Result Date: 12/08/2023 CLINICAL DATA:  Chest pain EXAM: PORTABLE CHEST 1 VIEW COMPARISON:  11/19/2023 FINDINGS: The heart size and mediastinal contours are within normal limits. Both lungs are clear. The visualized skeletal structures are unremarkable. No pneumothorax IMPRESSION: No active disease. Electronically Signed   By: Franky Crease M.D.   On: 12/08/2023 02:24   DG Chest 2 View Result Date: 11/19/2023 CLINICAL DATA:  Chest pain. EXAM: CHEST - 2 VIEW COMPARISON:  01/08/2023. FINDINGS: The heart size and mediastinal contours are within normal limits. Both  lungs are clear. No pleural effusion or pneumothorax. The visualized skeletal structures are unremarkable. IMPRESSION: No acute cardiopulmonary findings. Electronically Signed   By: Harrietta Sherry M.D.   On: 11/19/2023 09:49    Assessment/Plan  Diarrhea  Colitis with persistent abdominal pain and intermittent diarrhea Colitis diagnosed post-hysterectomy with persistent abdominal pain and intermittent diarrhea. Recent hospitalization for severe symptoms including vomiting and diarrhea. Current management includes GI specialist follow-up. - Continue follow-up with GI specialist - Avoid Miralax  if experiencing diarrhea - Maintain hydration and bland diet to avoid stomach irritation  Right knee pain and swelling Chronic right knee pain with occasional swelling and grinding sensation, exacerbated by weight and physical inactivity post-surgery. No prior imaging or specialist evaluation. - Order x-ray of right knee - Consider weight loss strategies to alleviate knee pain - Recommend water aerobics or swimming for low-impact exercise  Muscle spasms of right lower extremity Intermittent muscle spasms in the right lower extremity, sometimes severe enough to impair movement. Possibly related to previous steroid injections or physical inactivity. No prior imaging or specialist evaluation. - Order x-ray of hip and pelvis - Continue physical therapy for hip alignment and mobility  Anemia Mild anemia with hemoglobin at 11.26 g/dL. Symptoms include frequent cold sensation. Recent blood work shows low levels. - Repeat blood work to monitor hemoglobin levels  Hypokalemia, resolved Previous hypokalemia resolved with current potassium level at 3.5 mmol/L. Prefers dietary management over supplements. - Encourage consumption of potassium-rich foods such as bananas and dark leafy greens  Constipation, intermittent Intermittent constipation managed with Miralax . Advised to avoid Miralax  during episodes  of diarrhea to prevent exacerbation. - Avoid Miralax  during diarrhea episodes  Hemorrhoids Small hemorrhoid likely exacerbated by constipation. No current bleeding reported during this visit. - Encourage regular bowel movements to prevent constipation and hemorrhoid exacerbation  Hiatal hernia and gastroesophageal reflux disease (GERD) Hiatal hernia near esophagus, managed with daily omeprazole  for acid reflux. No current GERD symptoms reported. - Continue omeprazole  as prescribed  Earwax impaction, right ear Partial Right earwax impaction noted during examination. No infection present. - Remove earwax from right ear   Family/ staff Communication: Reviewed plan of care with patient verbalized understanding   Labs/tests ordered:  - CBC with Differential/Platelet - CMP with eGFR([facility]) - TSH - Dg right hip  - Dg right knee   Next Appointment : Return in about 1 year (around 12/15/2024) for annual Physical examination.   Spent 45 minutes of Face to face and non-face to face with patient  >50% time spent counseling; reviewing medical record; tests; labs; documentation and developing future plan of care.   Roxan JAYSON Plough, NP

## 2023-12-17 ENCOUNTER — Ambulatory Visit (HOSPITAL_COMMUNITY)

## 2023-12-17 LAB — CBC WITH DIFFERENTIAL/PLATELET
Absolute Lymphocytes: 2066 {cells}/uL (ref 850–3900)
Absolute Monocytes: 397 {cells}/uL (ref 200–950)
Basophils Absolute: 50 {cells}/uL (ref 0–200)
Basophils Relative: 0.8 %
Eosinophils Absolute: 258 {cells}/uL (ref 15–500)
Eosinophils Relative: 4.1 %
HCT: 43.1 % (ref 35.0–45.0)
Hemoglobin: 14.1 g/dL (ref 11.7–15.5)
MCH: 31.2 pg (ref 27.0–33.0)
MCHC: 32.7 g/dL (ref 32.0–36.0)
MCV: 95.4 fL (ref 80.0–100.0)
MPV: 11.3 fL (ref 7.5–12.5)
Monocytes Relative: 6.3 %
Neutro Abs: 3528 {cells}/uL (ref 1500–7800)
Neutrophils Relative %: 56 %
Platelets: 341 Thousand/uL (ref 140–400)
RBC: 4.52 Million/uL (ref 3.80–5.10)
RDW: 12.4 % (ref 11.0–15.0)
Total Lymphocyte: 32.8 %
WBC: 6.3 Thousand/uL (ref 3.8–10.8)

## 2023-12-17 LAB — COMPREHENSIVE METABOLIC PANEL WITH GFR
AG Ratio: 1.5 (calc) (ref 1.0–2.5)
ALT: 27 U/L (ref 6–29)
AST: 16 U/L (ref 10–30)
Albumin: 4.4 g/dL (ref 3.6–5.1)
Alkaline phosphatase (APISO): 63 U/L (ref 31–125)
BUN: 11 mg/dL (ref 7–25)
CO2: 25 mmol/L (ref 20–32)
Calcium: 9.8 mg/dL (ref 8.6–10.2)
Chloride: 104 mmol/L (ref 98–110)
Creat: 0.82 mg/dL (ref 0.50–0.97)
Globulin: 3 g/dL (ref 1.9–3.7)
Glucose, Bld: 77 mg/dL (ref 65–99)
Potassium: 4.2 mmol/L (ref 3.5–5.3)
Sodium: 139 mmol/L (ref 135–146)
Total Bilirubin: 1.3 mg/dL — ABNORMAL HIGH (ref 0.2–1.2)
Total Protein: 7.4 g/dL (ref 6.1–8.1)
eGFR: 96 mL/min/1.73m2 (ref >=60)

## 2023-12-17 LAB — TSH: TSH: 0.77 m[IU]/L

## 2023-12-20 DIAGNOSIS — K449 Diaphragmatic hernia without obstruction or gangrene: Secondary | ICD-10-CM | POA: Insufficient documentation

## 2023-12-22 ENCOUNTER — Telehealth: Payer: Self-pay | Admitting: Physician Assistant

## 2023-12-22 NOTE — Telephone Encounter (Signed)
 Inbound call from pt requesting to get a phone regarding if she can get a work note stating that she can go back to work. Patient  was extremely frustrated and stated that she has been missing work and currently does not have Pal. Patient is also requesting that we reach out to her today if possible. Please advise.

## 2023-12-23 ENCOUNTER — Ambulatory Visit (HOSPITAL_COMMUNITY)

## 2023-12-23 NOTE — Telephone Encounter (Signed)
 Patient has been advised that Attending Physician's statement form has been filled out and faxed to [facility] (fax [phone]) as of 9:530 am today, 12/23/23 (with faxed confirmation). I have also advised that we have created a letter and made this available through mychart and to [facility] that she may return to work tomorrow, 12/24/23 without restrictions. She verbalizes understanding.

## 2023-12-23 NOTE — Telephone Encounter (Signed)
 Holly Brown, please clarify. Return to work date 9/25 or 9/4?

## 2023-12-23 NOTE — Telephone Encounter (Signed)
 Per verbal from [place], patient to return to work 12/24/23.

## 2023-12-24 ENCOUNTER — Ambulatory Visit: Payer: Self-pay | Admitting: Family

## 2023-12-24 ENCOUNTER — Encounter (HOSPITAL_COMMUNITY): Payer: Self-pay

## 2023-12-24 ENCOUNTER — Other Ambulatory Visit (HOSPITAL_COMMUNITY): Payer: Self-pay

## 2023-12-24 ENCOUNTER — Ambulatory Visit (HOSPITAL_COMMUNITY)

## 2023-12-24 DIAGNOSIS — R17 Unspecified jaundice: Secondary | ICD-10-CM

## 2023-12-28 ENCOUNTER — Other Ambulatory Visit (HOSPITAL_COMMUNITY): Payer: Self-pay

## 2023-12-28 ENCOUNTER — Other Ambulatory Visit: Payer: Self-pay | Admitting: Family

## 2023-12-28 DIAGNOSIS — G8929 Other chronic pain: Secondary | ICD-10-CM

## 2023-12-28 MED ORDER — DICLOFENAC SODIUM 75 MG PO TBEC
75.0000 mg | DELAYED_RELEASE_TABLET | Freq: Two times a day (BID) | ORAL | 3 refills | Status: DC
  Filled 2023-12-28 (×2): qty 60, 30d supply, fill #0

## 2023-12-30 ENCOUNTER — Other Ambulatory Visit: Payer: Self-pay

## 2024-01-04 ENCOUNTER — Encounter: Admitting: Internal Medicine

## 2024-01-07 ENCOUNTER — Ambulatory Visit: Payer: Self-pay | Attending: Gynecologic Oncology | Admitting: Physical Therapy

## 2024-01-07 ENCOUNTER — Encounter: Payer: Self-pay | Admitting: Physical Therapy

## 2024-01-07 DIAGNOSIS — R262 Difficulty in walking, not elsewhere classified: Secondary | ICD-10-CM | POA: Diagnosis not present

## 2024-01-07 DIAGNOSIS — M6281 Muscle weakness (generalized): Secondary | ICD-10-CM | POA: Diagnosis not present

## 2024-01-07 NOTE — Therapy (Signed)
 OUTPATIENT PHYSICAL THERAPY FEMALE PELVIC EVALUATION   Patient Name: Holly Brown MRN: [id number] DOB:07/09/1985, 38 y.o., female Today's Date: 01/07/2024  END OF SESSION:  PT End of Session - 01/07/24 1104     Visit Number 2    Date for Recertification  06/04/24    Authorization Type aetna and [employer] medicaid- no auth needed    PT Start Time 1105    PT Stop Time 1145    PT Time Calculation (min) 40 min    Activity Tolerance Patient tolerated treatment well    Behavior During Therapy WFL for tasks assessed/performed          Past Medical History:  Diagnosis Date   Anxiety    Depression    Fatty liver    Gestational diabetes    metformin  - resolved after delivery   Hiatal hernia    History of UTI    HSV (herpes simplex virus) infection    Migraines    Peripheral vascular disease (HCC)    Post-operative nausea and vomiting 12/19/2022   Seizures (HCC)    Jan.2016 - only had one seizure, none since   Sleep apnea    Vaginal Pap smear, abnormal    Past Surgical History:  Procedure Laterality Date   ABDOMINAL HYSTERECTOMY  12/16/2022   BIOPSY  12/24/2022   Procedure: BIOPSY;  Surgeon: Leigh Elspeth SQUIBB, MD;  Location: [facility] ENDOSCOPY;  Service: Gastroenterology;;   BONE BIOPSY  12/12/2023   Procedure: BIOPSY, GI;  Surgeon: Federico Rosario BROCKS, MD;  Location: [facility] ENDOSCOPY;  Service: Gastroenterology;;   COLONOSCOPY N/A 12/12/2023   Procedure: COLONOSCOPY;  Surgeon: Federico Rosario BROCKS, MD;  Location: [facility] ENDOSCOPY;  Service: Gastroenterology;  Laterality: N/A;   DILATION AND CURETTAGE OF UTERUS     ESOPHAGOGASTRODUODENOSCOPY (EGD) WITH PROPOFOL  N/A 12/24/2022   Procedure: ESOPHAGOGASTRODUODENOSCOPY (EGD) WITH PROPOFOL ;  Surgeon: Leigh Elspeth SQUIBB, MD;  Location: [facility] ENDOSCOPY;  Service: Gastroenterology;  Laterality: N/A;   LAPAROSCOPIC TUBAL LIGATION Bilateral 12/08/2018   Procedure: LAPAROSCOPIC TUBAL LIGATION;  Surgeon: Alger Gong, MD;  Location: [facility];   Service: Gynecology;  Laterality: Bilateral;   LEEP N/A 03/18/2022   Procedure: LOOP ELECTROSURGICAL EXCISION PROCEDURE (LEEP);  Surgeon: Alger Gong, MD;  Location: [facility] OR;  Service: Gynecology;  Laterality: N/A;   POLYPECTOMY  12/12/2023   Procedure: POLYPECTOMY, INTESTINE;  Surgeon: Federico Rosario BROCKS, MD;  Location: [facility] ENDOSCOPY;  Service: Gastroenterology;;   TUBAL LIGATION     VAGINAL HYSTERECTOMY N/A 12/16/2022   Procedure: TOTAL VAGINAL HYSTERECTOMY, BILATERAL SALPINGECTOMY;  Surgeon: Lorence Ozell CROME, MD;  Location: [facility] OR;  Service: Gynecology;  Laterality: N/A;   WISDOM TOOTH EXTRACTION  04/21/2009   Patient Active Problem List   Diagnosis Date Noted   Hiatal hernia 12/20/2023   Colitis 10/25/2023   Lactic acidosis 10/25/2023   History of cervical cancer 10/25/2023   Hypokalemia 10/25/2023   Blurry vision, bilateral 10/16/2023   Abnormal uterine bleeding (AUB) 10/16/2023   Diarrhea 09/04/2023   Frequent urination 09/04/2023   Lower abdominal pain 07/24/2023   Left arm weakness 05/08/2023   History of cervical dysplasia 05/08/2023   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   Left sided sciatica 04/23/2023   Low magnesium  level 04/07/2023   Vaginal irritation 01/28/2023   Cervical cancer (HCC) 12/29/2022   Duodenal ulcer 12/24/2022   S/P hysterectomy 12/19/2022   History of syncope 12/10/2022   Chronic pain of left knee 12/10/2022   Family history of colon cancer 10/10/2015  PCP: Corwin Antu, FNP  REFERRING PROVIDER: [name] Comer SAUNDERS, MD  REFERRING DIAG: [phone] (ICD-10-CM) - Pelvic floor dysfunction  THERAPY DIAG:  Muscle weakness (generalized)  Difficulty in walking, not elsewhere classified  Rationale for Evaluation and Treatment: Rehabilitation ONSET DATE: 11/2022  SUBJECTIVE:                                                                                                                                                                                            SUBJECTIVE STATEMENT: Patient returning to PT after 5 weeks, she reports that she is still in pain, has a lot of right hip pain. Pain is 0/10  with medicine, but walking for 5 mins aggravates it. Treadmill can be OK for 20 mins. Middle of the day PT is hard to do. Would love afternoons due to work.  She is frustrated, awaiting referral to an orthopedic. Has a CT scan but not sure what it showed.   She did an x ray and they saw possible mild impingement.  Patient wants to prioritize her hip pain and defer pelvic pain/ internal pelvic floor assessment  to a later date Being still aggravates it, lying on at night aggravates it.    From eval Patient reports that she had vaginal hysterectomy 11/2022 and then they found cervical cancer. She had a second surgery October 2024. Was walking with a walker, recovering until December. She was cramping and having pelvic pain. Had a fall at work which messed up her right hip in May. She is experiencing right groin pressure with breathing, feels like it wants to pop. She goes to the doctor every 3 months not and they keep finding lumps, but it's nothing.  Heard a pop every time she works on a machine at [employer], leg press like and it hurts, so she stopped Patient reports weak after her surgeries, wants to return to the gym. Has 4 children  PAIN:  Are you having pain? Yes NPRS scale: 7/10 Pain location: Right groin- superficial  Pain type: dull, pain Pain description: intermittent   Aggravating factors: walking 5 mins Relieving factors: heat and ice  PRECAUTIONS: None  RED FLAGS: None   WEIGHT BEARING RESTRICTIONS: No  FALLS:  Has patient fallen in last 6 months? Yes. Number of falls 1  OCCUPATION: operator, IT  ACTIVITY LEVEL : walks  PLOF: Independent  PATIENT GOALS: reduced pain in the right hip  PERTINENT HISTORY:  Hysterectomy 2024 Sexual abuse: No  BOWEL MOVEMENT: no issues   URINATION: no  issues  INTERCOURSE:  Ability to have vaginal penetration Yes  Pain with intercourse: During Penetration and After  Intercourse- has to take tylenol  DrynessNo Climax: yes Marinoff Scale: 3/3 Lubricant: no  PREGNANCY:  Vaginal deliveries 4 Tearing Yes:   Episiotomy No C-section deliveries 0 Currently pregnant No  PROLAPSE: None   OBJECTIVE:  Note: Objective measures were completed at Evaluation unless otherwise noted.   PATIENT SURVEYS:   PFIQ-7: 59  COGNITION: Overall cognitive status: Within functional limits for tasks assessed     SENSATION: Light touch: Appears intact   FUNCTIONAL TESTS:  Squat: knee valgus bilat Single leg stance: difficult  Rt:  Lt: Curl-up test: 1/3  Right hip scour- positive for pain Right hip tight in IR and ER Guarded movement throughout Weakness present   GAIT: Assistive device utilized: None Comments: slightly antalgic on rt  POSTURE: increased lumbar lordosis   LUMBARAROM/PROM:  A/PROM A/PROM  Eval (% available)  Flexion 75%  Extension 75%  Right lateral flexion 75%  Left lateral flexion 75%  Right rotation 75%  Left rotation 75%   (Blank rows = not tested)  PALPATION:   General: no tenderness right adductors or glutes  Pelvic Alignment: pelvic dips with SLR in supine  Abdominal: weakness present- compensates with SLR in supine                External Perineal Exam: to be assessed                              Internal Pelvic Floor: to be assessed   Patient confirms identification and approves PT to assess internal pelvic floor and treatment No  PELVIC MMT:   MMT eval  Vaginal   Internal Anal Sphincter   External Anal Sphincter   Puborectalis   Diastasis Recti 1 finger with doming throughout  (Blank rows = not tested)        TONE: to be assessed  PROLAPSE: to be assessed  TODAY'S TREATMENT:                                                                                                                               DATE:  01/07/2024 Non aggravating  Hip adduction with ball 20 reps Hip abd with thera band 20 reps 25 reps Resisted marching with black loop  25 reps QL stretch bilateral 20 reps bilateral Leg raise 20 reps- slightly bent knee      12/03/2023   PATIENT EDUCATION:  Education details: Pt was educated on relevant anatomy, exam findings, home exercise program, plan of care, expectations of PT and to avoid aggravating hip exercises  Person educated: Patient Education method: Explanation, Demonstration, Tactile cues, Verbal cues, and Handouts Education comprehension: verbalized understanding  HOME EXERCISE PROGRAM: Access Code: [email or web address] URL: [email or web address] Date: 12/03/2023 Prepared by: Cori Marinell Igarashi  Exercises - Supine Bridge with Pelvic Floor Contraction  - 1 x daily - 7 x weekly - 2 sets - 10 reps - Supine Figure 4 Piriformis Stretch  - 1  x daily - 7 x weekly - 2 sets - 10 reps - Active Straight Leg Raise with Quad Set  - 1 x daily - 7 x weekly - 2 sets - 10 reps  ASSESSMENT:  CLINICAL IMPRESSION: Patient was seen today for treatment of painful hip. Wants to prioritize right hip pain as it is limiting her walking. Patient with no pain today with exercises due to being on medicine. Patient did well with exercises, and education today. We discussed progress, HEP and recommended pain free range. Treatment session focused on gentle strengthening to improve right hip pain. Patient is progressing slowly towards goals and will benefit from continued PT to address deficits, reduce pain and improve quality of life.        Patient is a 38 y.o. F who was seen today for physical therapy evaluation and treatment for right hip pain and dyspareunia after a couple of surgeries, hospitalization and a fall. She is globally weak, has  a lot of stiffness right hip and difficulty with walking short distances- 5 mins at work. She reported that she is sedentary at  work and will look into getting a standing desk.   Exam findings are notable for upper chest breathing strategies, abdominal and right hip weakness. Patient demonstrates fair trunk mobility. It is difficult for patient to walk and have intercourse due to vaginal pain and right hip pain . Discussed findings with patient, educated patient on internal pelvic floor assessment next visit and HEP was initiated. Patient's quality of life has been affected, patient will benefit from physical therapy to address deficits, reduce pain in hip  and improve mobility  and quality of life.    OBJECTIVE IMPAIRMENTS: decreased activity tolerance, decreased coordination, decreased endurance, decreased mobility, decreased ROM, decreased strength, increased fascial restrictions, increased muscle spasms, impaired flexibility, impaired tone, improper body mechanics, postural dysfunction, and pain.   ACTIVITY LIMITATIONS: sitting and locomotion level  PARTICIPATION LIMITATIONS: interpersonal relationship, community activity, and occupation  PERSONAL FACTORS: Time since onset of injury/illness/exacerbation are also affecting patient's functional outcome.   REHAB POTENTIAL: Good  CLINICAL DECISION MAKING: Evolving/moderate complexity  EVALUATION COMPLEXITY: Moderate   GOALS: Goals reviewed with patient? Yes  SHORT TERM GOALS: Target date: 12/31/2023    Pt will be independent with HEP.   Baseline: Goal status: INITIAL  2.  Patient will be educated on oh nuts to reduce pelvic pain with intercourse Baseline:  Goal status: INITIAL  3.  Patient will demonstrate improved right hip strength to 4/5 Baseline:  Goal status: INITIAL  4.  Patient will be able to walk at least 15 mins without increase right hip pain Baseline: 4-/5 flexion Goal status: INITIAL   LONG TERM GOALS: Target date: 06/05/2023  Pt will be independent with advanced HEP.   Baseline:  Goal status: INITIAL  2.  Patient will be able to  walk at least for 30 mins without increased pain Baseline:  Goal status: INITIAL  3.  Patient will have 5/5 stretch right hip Baseline:  Goal status: INITIAL  4.  Good AROM/ PROM right hip to normal Baseline:  Goal status: INITIAL  5.  No pain with intercourse with boyfriend Baseline: 10/10 Goal status: INITIAL  6.  Return to the gym without increased hip pain Baseline:  Goal status: INITIAL  PLAN:  PT FREQUENCY: 1-2x/week  PT DURATION: 6 months   PLANNED INTERVENTIONS: 97164- PT Re-evaluation, 97110-Therapeutic exercises, 97530- Therapeutic activity, 97112- Neuromuscular re-education, 97535- Self Care, 02859- Manual therapy, [id number]- Gait training,  02886- Aquatic Therapy, [id number]- Electrical stimulation (unattended), [phone]- Traction (mechanical), 02966- Ionotophoresis 4mg /ml Dexamethasone , 20560 (1-2 muscles), 20561 (3+ muscles)- Dry Needling, Patient/Family education, Balance training, Taping, Joint mobilization, Joint manipulation, Spinal manipulation, Spinal mobilization, Scar mobilization, Vestibular training, Cryotherapy, Moist heat, and Biofeedback  PLAN FOR NEXT SESSION: internal pelvic floor assessment, continue exercises for hip strengthening as she can, avoid aggravating IR, add non irritating stretches the HEP   Krystl Wickware, PT 01/07/2024, 11:05 AM

## 2024-01-07 NOTE — Telephone Encounter (Signed)
 Copied from CRM [id number]. Topic: Referral - Request for Referral >> Jan 07, 2024 12:56 PM Laurier C wrote: Did the patient discuss referral with their provider in the last year? Yes  Appointment offered? Yes  Type of order/referral and detailed reason for visit: Orthopedics  Preference of office, provider, location: Anywhere local  If referral order, have you been seen by this specialty before? No (If Yes, this issue or another issue? When? Where?  Can we respond through MyChart? Yes

## 2024-01-14 ENCOUNTER — Encounter: Payer: Self-pay | Admitting: Physical Therapy

## 2024-01-14 ENCOUNTER — Ambulatory Visit: Admitting: Gynecologic Oncology

## 2024-01-14 ENCOUNTER — Other Ambulatory Visit: Payer: Self-pay | Admitting: Family

## 2024-01-14 ENCOUNTER — Ambulatory Visit: Admitting: Physical Therapy

## 2024-01-14 DIAGNOSIS — M6281 Muscle weakness (generalized): Secondary | ICD-10-CM

## 2024-01-14 DIAGNOSIS — G8929 Other chronic pain: Secondary | ICD-10-CM

## 2024-01-14 DIAGNOSIS — R262 Difficulty in walking, not elsewhere classified: Secondary | ICD-10-CM

## 2024-01-14 NOTE — Therapy (Signed)
 OUTPATIENT PHYSICAL THERAPY FEMALE PELVIC EVALUATION   Patient Name: Holly Brown MRN: [id number] DOB:18-Jan-1986, 38 y.o., female Today's Date: 01/14/2024  END OF SESSION:  PT End of Session - 01/14/24 1704     Visit Number 3    Date for Recertification  06/04/24    Authorization Type aetna and [employer] medicaid- no auth needed    PT Start Time 1618    PT Stop Time 1700    PT Time Calculation (min) 42 min    Activity Tolerance Patient tolerated treatment well    Behavior During Therapy WFL for tasks assessed/performed           Past Medical History:  Diagnosis Date   Anxiety    Depression    Fatty liver    Gestational diabetes    metformin  - resolved after delivery   Hiatal hernia    History of UTI    HSV (herpes simplex virus) infection    Migraines    Peripheral vascular disease    Post-operative nausea and vomiting 12/19/2022   Seizures (HCC)    Jan.2016 - only had one seizure, none since   Sleep apnea    Vaginal Pap smear, abnormal    Past Surgical History:  Procedure Laterality Date   ABDOMINAL HYSTERECTOMY  12/16/2022   BIOPSY  12/24/2022   Procedure: BIOPSY;  Surgeon: Leigh Elspeth SQUIBB, MD;  Location: [facility] ENDOSCOPY;  Service: Gastroenterology;;   BONE BIOPSY  12/12/2023   Procedure: BIOPSY, GI;  Surgeon: Federico Rosario BROCKS, MD;  Location: [facility] ENDOSCOPY;  Service: Gastroenterology;;   COLONOSCOPY N/A 12/12/2023   Procedure: COLONOSCOPY;  Surgeon: Federico Rosario BROCKS, MD;  Location: [facility] ENDOSCOPY;  Service: Gastroenterology;  Laterality: N/A;   DILATION AND CURETTAGE OF UTERUS     ESOPHAGOGASTRODUODENOSCOPY (EGD) WITH PROPOFOL  N/A 12/24/2022   Procedure: ESOPHAGOGASTRODUODENOSCOPY (EGD) WITH PROPOFOL ;  Surgeon: Leigh Elspeth SQUIBB, MD;  Location: [facility] ENDOSCOPY;  Service: Gastroenterology;  Laterality: N/A;   LAPAROSCOPIC TUBAL LIGATION Bilateral 12/08/2018   Procedure: LAPAROSCOPIC TUBAL LIGATION;  Surgeon: Alger Gong, MD;  Location: [facility];   Service: Gynecology;  Laterality: Bilateral;   LEEP N/A 03/18/2022   Procedure: LOOP ELECTROSURGICAL EXCISION PROCEDURE (LEEP);  Surgeon: Alger Gong, MD;  Location: [facility] OR;  Service: Gynecology;  Laterality: N/A;   POLYPECTOMY  12/12/2023   Procedure: POLYPECTOMY, INTESTINE;  Surgeon: Federico Rosario BROCKS, MD;  Location: [facility] ENDOSCOPY;  Service: Gastroenterology;;   TUBAL LIGATION     VAGINAL HYSTERECTOMY N/A 12/16/2022   Procedure: TOTAL VAGINAL HYSTERECTOMY, BILATERAL SALPINGECTOMY;  Surgeon: Lorence Ozell CROME, MD;  Location: [facility] OR;  Service: Gynecology;  Laterality: N/A;   WISDOM TOOTH EXTRACTION  04/21/2009   Patient Active Problem List   Diagnosis Date Noted   Hiatal hernia 12/20/2023   Colitis 10/25/2023   Lactic acidosis 10/25/2023   History of cervical cancer 10/25/2023   Hypokalemia 10/25/2023   Blurry vision, bilateral 10/16/2023   Abnormal uterine bleeding (AUB) 10/16/2023   Diarrhea 09/04/2023   Frequent urination 09/04/2023   Lower abdominal pain 07/24/2023   Left arm weakness 05/08/2023   History of cervical dysplasia 05/08/2023   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   Left sided sciatica 04/23/2023   Low magnesium  level 04/07/2023   Vaginal irritation 01/28/2023   Cervical cancer (HCC) 12/29/2022   Duodenal ulcer 12/24/2022   S/P hysterectomy 12/19/2022   History of syncope 12/10/2022   Chronic pain of left knee 12/10/2022   Family history of colon cancer 10/10/2015  PCP: Corwin Antu, FNP  REFERRING PROVIDER: [name] Comer SAUNDERS, MD  REFERRING DIAG: [phone] (ICD-10-CM) - Pelvic floor dysfunction  THERAPY DIAG:  Muscle weakness (generalized)  Difficulty in walking, not elsewhere classified  Rationale for Evaluation and Treatment: Rehabilitation ONSET DATE: 11/2022  SUBJECTIVE:                                                                                                                                                                                            SUBJECTIVE STATEMENT: Patient reports her hip pain is the same. Pain is 4/10. She takes medication everyday to prevent the pain.   From eval Patient reports that she had vaginal hysterectomy 11/2022 and then they found cervical cancer. She had a second surgery October 2024. Was walking with a walker, recovering until December. She was cramping and having pelvic pain. Had a fall at work which messed up her right hip in May. She is experiencing right groin pressure with breathing, feels like it wants to pop. She goes to the doctor every 3 months not and they keep finding lumps, but it's nothing.  Heard a pop every time she works on a machine at [employer], leg press like and it hurts, so she stopped Patient reports weak after her surgeries, wants to return to the gym. Has 4 children  PAIN:  Are you having pain? Yes NPRS scale: 7/10 Pain location: Right groin- superficial  Pain type: dull, pain Pain description: intermittent   Aggravating factors: walking 5 mins Relieving factors: heat and ice  PRECAUTIONS: None  RED FLAGS: None   WEIGHT BEARING RESTRICTIONS: No  FALLS:  Has patient fallen in last 6 months? Yes. Number of falls 1  OCCUPATION: operator, IT  ACTIVITY LEVEL : walks  PLOF: Independent  PATIENT GOALS: reduced pain in the right hip  PERTINENT HISTORY:  Hysterectomy 2024 Sexual abuse: No  BOWEL MOVEMENT: no issues   URINATION: no issues  INTERCOURSE:  Ability to have vaginal penetration Yes  Pain with intercourse: During Penetration and After Intercourse- has to take tylenol  DrynessNo Climax: yes Marinoff Scale: 3/3 Lubricant: no  PREGNANCY:  Vaginal deliveries 4 Tearing Yes:   Episiotomy No C-section deliveries 0 Currently pregnant No  PROLAPSE: None   OBJECTIVE:  Note: Objective measures were completed at Evaluation unless otherwise noted.   PATIENT SURVEYS:   PFIQ-7: 29  COGNITION: Overall cognitive status: Within  functional limits for tasks assessed     SENSATION: Light touch: Appears intact   FUNCTIONAL TESTS:  Squat: knee valgus bilat Single leg stance: difficult  Rt:  Lt: Curl-up test: 1/3  Right hip scour- positive for  pain Right hip tight in IR and ER Guarded movement throughout Weakness present   GAIT: Assistive device utilized: None Comments: slightly antalgic on rt  POSTURE: increased lumbar lordosis   LUMBARAROM/PROM:  A/PROM A/PROM  Eval (% available)  Flexion 75%  Extension 75%  Right lateral flexion 75%  Left lateral flexion 75%  Right rotation 75%  Left rotation 75%   (Blank rows = not tested)  PALPATION:   General: no tenderness right adductors or glutes  Pelvic Alignment: pelvic dips with SLR in supine  Abdominal: weakness present- compensates with SLR in supine                External Perineal Exam: to be assessed                              Internal Pelvic Floor: to be assessed   Patient confirms identification and approves PT to assess internal pelvic floor and treatment No  PELVIC MMT:   MMT eval  Vaginal   Internal Anal Sphincter   External Anal Sphincter   Puborectalis   Diastasis Recti 1 finger with doming throughout  (Blank rows = not tested)        TONE: to be assessed  PROLAPSE: to be assessed  TODAY'S TREATMENT:                                                                                                                              DATE:  01/14/2024 NuStep Level 1 5 mins- PT present to discuss status Rt Hip assessment : scour (painful when coming close to midline; no pain with hip PROM Manual: long axis hip distraction. Lateral hip mobs with belt (this felt good) Patient education on imaging results  Supine hip internal/ external rotation x 10 bilateral  Hooklying TA activation + ball squeeze 2 x 10 Supine bridge 2 x 10 Hooklying hip abduction with red loop x 20 Supine SLR x 10 bilateral   01/07/2024 Non aggravating   Hip adduction with ball 20 reps Hip abd with thera band 20 reps 25 reps Resisted marching with black loop  25 reps QL stretch bilateral 20 reps bilateral Leg raise 20 reps- slightly bent knee      12/03/2023   PATIENT EDUCATION:  Education details: Pt was educated on relevant anatomy, exam findings, home exercise program, plan of care, expectations of PT and to avoid aggravating hip exercises  Person educated: Patient Education method: Explanation, Demonstration, Tactile cues, Verbal cues, and Handouts Education comprehension: verbalized understanding  HOME EXERCISE PROGRAM: Access Code: [email or web address] URL: [email or web address] Date: 01/14/2024 Prepared by: Kristeen Sar  Exercises - Supine Bridge with Pelvic Floor Contraction  - 1 x daily - 7 x weekly - 2 sets - 10 reps - Supine Figure 4 Piriformis Stretch  - 1 x daily - 7 x weekly - 2 sets - 10 reps - Active Straight Leg Raise with  Quad Set  - 1 x daily - 7 x weekly - 2 sets - 10 reps - Quadruped Hip Flexion Mobilization With Movement: Band Lateral  - 1 x daily - 7 x weekly - 3 sets - 10 reps - Supine Hip Internal and External Rotation  - 1 x daily - 7 x weekly - 1 sets - 10 reps - Supine Bridge  - 1 x daily - 7 x weekly - 2 sets - 10 reps  ASSESSMENT:  CLINICAL IMPRESSION: Patient presents with moderate hip pain levels today. She verbalized taking pain medication everyday to lessen the pain. Educated patient on her Xray findings and how sitting for longer periods of time can cause more pain. Patient responded well to lateral hip joint mobs and she verbalized relief. Introduced gentle hip strengthening and will assess patient's tolerance to exercises next treatment session. Educated patient to move more throughout her day. Today she sat for 4 hours straight before she got up. Patient will benefit from skilled PT to address the below impairments and improve overall function.      Patient is a 38 y.o. F who was seen  today for physical therapy evaluation and treatment for right hip pain and dyspareunia after a couple of surgeries, hospitalization and a fall. She is globally weak, has  a lot of stiffness right hip and difficulty with walking short distances- 5 mins at work. She reported that she is sedentary at work and will look into getting a standing desk.   Exam findings are notable for upper chest breathing strategies, abdominal and right hip weakness. Patient demonstrates fair trunk mobility. It is difficult for patient to walk and have intercourse due to vaginal pain and right hip pain . Discussed findings with patient, educated patient on internal pelvic floor assessment next visit and HEP was initiated. Patient's quality of life has been affected, patient will benefit from physical therapy to address deficits, reduce pain in hip  and improve mobility  and quality of life.    OBJECTIVE IMPAIRMENTS: decreased activity tolerance, decreased coordination, decreased endurance, decreased mobility, decreased ROM, decreased strength, increased fascial restrictions, increased muscle spasms, impaired flexibility, impaired tone, improper body mechanics, postural dysfunction, and pain.   ACTIVITY LIMITATIONS: sitting and locomotion level  PARTICIPATION LIMITATIONS: interpersonal relationship, community activity, and occupation  PERSONAL FACTORS: Time since onset of injury/illness/exacerbation are also affecting patient's functional outcome.   REHAB POTENTIAL: Good  CLINICAL DECISION MAKING: Evolving/moderate complexity  EVALUATION COMPLEXITY: Moderate   GOALS: Goals reviewed with patient? Yes  SHORT TERM GOALS: Target date: 12/31/2023    Pt will be independent with HEP.   Baseline: Goal status: INITIAL  2.  Patient will be educated on oh nuts to reduce pelvic pain with intercourse Baseline:  Goal status: INITIAL  3.  Patient will demonstrate improved right hip strength to 4/5 Baseline:  Goal status:  INITIAL  4.  Patient will be able to walk at least 15 mins without increase right hip pain Baseline: 4-/5 flexion Goal status: INITIAL   LONG TERM GOALS: Target date: 06/05/2023  Pt will be independent with advanced HEP.   Baseline:  Goal status: INITIAL  2.  Patient will be able to walk at least for 30 mins without increased pain Baseline:  Goal status: INITIAL  3.  Patient will have 5/5 stretch right hip Baseline:  Goal status: INITIAL  4.  Good AROM/ PROM right hip to normal Baseline:  Goal status: INITIAL  5.  No pain with intercourse with  boyfriend Baseline: 10/10 Goal status: INITIAL  6.  Return to the gym without increased hip pain Baseline:  Goal status: INITIAL  PLAN:  PT FREQUENCY: 1-2x/week  PT DURATION: 6 months   PLANNED INTERVENTIONS: 97164- PT Re-evaluation, 97110-Therapeutic exercises, 97530- Therapeutic activity, 97112- Neuromuscular re-education, 97535- Self Care, [id number]- Manual therapy, [phone]- Gait training, [phone]- Aquatic Therapy, [phone]- Electrical stimulation (unattended), [phone]- Traction (mechanical), [id number]- Ionotophoresis 4mg /ml Dexamethasone , 79439 (1-2 muscles), 20561 (3+ muscles)- Dry Needling, Patient/Family education, Balance training, Taping, Joint mobilization, Joint manipulation, Spinal manipulation, Spinal mobilization, Scar mobilization, Vestibular training, Cryotherapy, Moist heat, and Biofeedback  PLAN FOR NEXT SESSION: ask about tolerance to treatment session; continue lateral hip mobs; see if she has been moving more at work, hip strengthening and flexibility   Kristeen Sar, PT 01/14/24 5:05 PM

## 2024-01-18 ENCOUNTER — Ambulatory Visit (HOSPITAL_COMMUNITY)
Admission: RE | Admit: 2024-01-18 | Discharge: 2024-01-18 | Disposition: A | Discharge status: Home / Self Care | Source: Ambulatory Visit | Attending: Obstetrics and Gynecology | Admitting: Obstetrics and Gynecology

## 2024-01-18 ENCOUNTER — Other Ambulatory Visit

## 2024-01-18 DIAGNOSIS — R17 Unspecified jaundice: Secondary | ICD-10-CM

## 2024-01-18 DIAGNOSIS — N83202 Unspecified ovarian cyst, left side: Secondary | ICD-10-CM | POA: Insufficient documentation

## 2024-01-18 DIAGNOSIS — N83201 Unspecified ovarian cyst, right side: Secondary | ICD-10-CM | POA: Diagnosis not present

## 2024-01-19 ENCOUNTER — Encounter: Payer: Self-pay | Admitting: Gynecologic Oncology

## 2024-01-20 ENCOUNTER — Other Ambulatory Visit

## 2024-01-20 ENCOUNTER — Encounter: Payer: Self-pay | Admitting: Physical Therapy

## 2024-01-20 ENCOUNTER — Ambulatory Visit: Attending: Gynecologic Oncology | Admitting: Physical Therapy

## 2024-01-20 DIAGNOSIS — R262 Difficulty in walking, not elsewhere classified: Secondary | ICD-10-CM | POA: Insufficient documentation

## 2024-01-20 DIAGNOSIS — R17 Unspecified jaundice: Secondary | ICD-10-CM | POA: Diagnosis not present

## 2024-01-20 DIAGNOSIS — M6281 Muscle weakness (generalized): Secondary | ICD-10-CM | POA: Diagnosis not present

## 2024-01-20 LAB — HEPATIC FUNCTION PANEL
AG Ratio: 1.8 (calc) (ref 1.0–2.5)
ALT: 12 U/L (ref 6–29)
AST: 12 U/L (ref 10–30)
Albumin: 4.3 g/dL (ref 3.6–5.1)
Alkaline phosphatase (APISO): 61 U/L (ref 31–125)
Bilirubin, Direct: 0.2 mg/dL (ref 0.0–0.2)
Globulin: 2.4 g/dL (ref 1.9–3.7)
Indirect Bilirubin: 1.1 mg/dL (ref 0.2–1.2)
Total Bilirubin: 1.3 mg/dL — ABNORMAL HIGH (ref 0.2–1.2)
Total Protein: 6.7 g/dL (ref 6.1–8.1)

## 2024-01-20 NOTE — Therapy (Addendum)
 OUTPATIENT PHYSICAL THERAPY FEMALE PELVIC TREATMENT/ DISCHARGE NOTE   Patient Name: Holly Brown MRN: [id number] DOB:[birth year], 38 y.o., female Today's Date: 01/20/2024  END OF SESSION:  PT End of Session - 01/20/24 1621     Visit Number 4    Date for Recertification  06/04/24    Authorization Type aetna and [employer] medicaid- no auth needed    PT Start Time 1537    PT Stop Time 1616    PT Time Calculation (min) 39 min    Activity Tolerance Patient tolerated treatment well    Behavior During Therapy WFL for tasks assessed/performed            Past Medical History:  Diagnosis Date   Anxiety    Depression    Fatty liver    Gestational diabetes    metformin  - resolved after delivery   Hiatal hernia    History of UTI    HSV (herpes simplex virus) infection    Migraines    Peripheral vascular disease    Post-operative nausea and vomiting 12/19/2022   Seizures (HCC)    Jan.2016 - only had one seizure, none since   Sleep apnea    Vaginal Pap smear, abnormal    Past Surgical History:  Procedure Laterality Date   ABDOMINAL HYSTERECTOMY  12/16/2022   BIOPSY  12/24/2022   Procedure: BIOPSY;  Surgeon: Leigh Elspeth SQUIBB, MD;  Location: [facility] ENDOS[facility];  Service: Gastroenterology;;   BONE BIOPSY  12/12/2023   Procedure: BIOPSY, GI;  Surgeon: Federico Rosario BROCKS, MD;  Location: [facility] ENDOSCOPY;  Service: Gastroenterology;;   COLONOSCOPY N/A 12/12/2023   Procedure: COLONOSCOPY;  Surgeon: Federico Rosario BROCKS, MD;  Location: [facility] ENDOSCOPY;  Service: Gastroenterology;  Laterality: N/A;   DILATION AND CURETTAGE OF UTERUS     ESOPHAGOGASTRODUODENOSCOPY (EGD) WITH PROPOFOL  N/A 12/24/2022   Procedure: ESOPHAGOGASTRODUODENOSCOPY (EGD) WITH PROPOFOL ;  Surgeon: Leigh Elspeth SQUIBB, MD;  Location: [facility] END[facility]COPY;  Service: Gastroenterology;  Laterality: N/A;   LAPAROSCOPIC TUBAL LIGATION Bilateral 12/08/2018   Procedure: LAPAROSCOPIC TUBAL LIGATION;  Surgeon: Alger Gong, MD;  Location: [facility];  Service: Gynecology;  Laterality: Bilateral;   LEEP N/A 03/18/2022   Procedure: LOOP ELECTROSURGICAL EXCISION PROCEDURE (LEEP);  Surgeon: Alger Gong, MD;  Location: [facility] OR;  Service: Gynecology;  Laterality: N/A;   POLYPECTOMY  12/12/2023   Procedure: POLYPECTOMY, INTESTINE;  Surgeon: Federico Rosario BROCKS, MD;  Location: [facility] ENDOSCOPY;  Service: Gastroenterology;;   TUBAL LIGATION     VAGINAL HYSTERECTOMY N/A 12/16/2022   Procedure: TOTAL VAGINAL HYSTERECTOMY, BILATERAL SALPINGECTOMY;  Surgeon: Lorence Ozell CROME, MD;  Location: [facility] OR;  Service: Gynecology;  Laterality: N/A;   WISDOM TOOTH EXTRACTION  04/21/2009   Patient Active Problem List   Diagnosis Date Noted   Hiatal hernia 12/20/2023   Colitis 10/25/2023   Lactic acidosis 10/25/2023   History of cervical cancer 10/25/2023   Hypokalemia 10/25/2023   Blurry vision, bilateral 10/16/2023   Abnormal uterine bleeding (AUB) 10/16/2023   Diarrhea 09/04/2023   Frequent urination 09/04/2023   Lower abdominal pain 07/24/2023   Left arm weakness 05/08/2023   History of cervical dysplasia 05/08/2023   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   Left sided sciatica 04/23/2023   Low magnesium  level 04/07/2023   Vaginal irritation 01/28/2023   Cervical cancer (HCC) 12/29/2022   Duodenal ulcer 12/24/2022   S/P hysterectomy 12/19/2022   History of syncope 12/10/2022   Chronic pain of left knee 12/10/2022   Family history of colon cancer 10/10/2015  PCP: Corwin Antu, FNP  REFERRING PROVIDER: [name] Comer SAUNDERS, MD  REFERRING DIAG: [id number] (ICD-10-CM) - Pelvic floor dysfunction  THERAPY DIAG:  Muscle weakness (generalized)  Difficulty in walking, not elsewhere classified  Rationale for Evaluation and Treatment: Rehabilitation ONSET DATE: 11/2022  SUBJECTIVE:                                                                                                                                                                                            SUBJECTIVE STATEMENT: Patient reports she is doing okay today. 0/10 pain. She had increased pain the day after last treatment. She has been trying to move more at work and that has helped her pain a lot.   From eval Patient reports that she had vaginal hysterectomy 11/2022 and then they found cervical cancer. She had a second surgery October 2024. Was walking with a walker, recovering until December. She was cramping and having pelvic pain. Had a fall at work which messed up her right hip in May. She is experiencing right groin pressure with breathing, feels like it wants to pop. She goes to the doctor every 3 months not and they keep finding lumps, but it's nothing.  Heard a pop every time she works on a machine at [employer], leg press like and it hurts, so she stopped Patient reports weak after her surgeries, wants to return to the gym. Has 4 children  PAIN:  Are you having pain? Yes NPRS scale: 7/10 Pain location: Right groin- superficial  Pain type: dull, pain Pain description: intermittent   Aggravating factors: walking 5 mins Relieving factors: heat and ice  PRECAUTIONS: None  RED FLAGS: None   WEIGHT BEARING RESTRICTIONS: No  FALLS:  Has patient fallen in last 6 months? Yes. Number of falls 1  OCCUPATION: operator, IT  ACTIVITY LEVEL : walks  PLOF: Independent  PATIENT GOALS: reduced pain in the right hip  PERTINENT HISTORY:  Hysterectomy 2024 Sexual abuse: No  BOWEL MOVEMENT: no issues   URINATION: no issues  INTERCOURSE:  Ability to have vaginal penetration Yes  Pain with intercourse: During Penetration and After Intercourse- has to take tylenol  DrynessNo Climax: yes Marinoff Scale: 3/3 Lubricant: no  PREGNANCY:  Vaginal deliveries 4 Tearing Yes:   Episiotomy No C-section deliveries 0 Currently pregnant No  PROLAPSE: None   OBJECTIVE:  Note: Objective measures were completed at Evaluation unless otherwise  noted.   PATIENT SURVEYS:   PFIQ-7: 29  COGNITION: Overall cognitive status: Within functional limits for tasks assessed     SENSATION: Light touch: Appears intact   FUNCTIONAL TESTS:  Squat: knee valgus bilat Single  leg stance: difficult  Rt:  Lt: Curl-up test: 1/3  Right hip scour- positive for pain Right hip tight in IR and ER Guarded movement throughout Weakness present   GAIT: Assistive device utilized: None Comments: slightly antalgic on rt  POSTURE: increased lumbar lordosis   LUMBARAROM/PROM:  A/PROM A/PROM  Eval (% available)  Flexion 75%  Extension 75%  Right lateral flexion 75%  Left lateral flexion 75%  Right rotation 75%  Left rotation 75%   (Blank rows = not tested)  PALPATION:   General: no tenderness right adductors or glutes  Pelvic Alignment: pelvic dips with SLR in supine  Abdominal: weakness present- compensates with SLR in supine                External Perineal Exam: to be assessed                              Internal Pelvic Floor: to be assessed   Patient confirms identification and approves PT to assess internal pelvic floor and treatment No  PELVIC MMT:   MMT eval  Vaginal   Internal Anal Sphincter   External Anal Sphincter   Puborectalis   Diastasis Recti 1 finger with doming throughout  (Blank rows = not tested)        TONE: to be assessed  PROLAPSE: to be assessed  TODAY'S TREATMENT:                                                                                                                              DATE:  01/20/2024 Recumbent Bike Level 2 6 mins- PT present to discuss status Manual: lateral hip joint mobs with belt Supine hip flexor stretch (Rt) with left knee to chest 3 x 30 sec  Bridge (up with both down with one leg) x 10 bilateral  SL clamshell with yellow loop 2 x 10 bilateral  Hooklying TA activation + march with yellow loop x 20 bilateral  Forward T + ER x 5 bilateral     01/14/2024 NuStep Level 1 5 mins- PT present to discuss status Rt Hip assessment : scour (painful when coming close to midline; no pain with hip PROM Manual: long axis hip distraction. Lateral hip mobs with belt (this felt good) Patient education on imaging results  Supine hip internal/ external rotation x 10 bilateral  Hooklying TA activation + ball squeeze 2 x 10 Supine bridge 2 x 10 Hooklying hip abduction with red loop x 20 Supine SLR x 10 bilateral   01/07/2024 Non aggravating  Hip adduction with ball 20 reps Hip abd with thera band 20 reps 25 reps Resisted marching with black loop  25 reps QL stretch bilateral 20 reps bilateral Leg raise 20 reps- slightly bent knee       PATIENT EDUCATION:  Education details: Pt was educated on relevant anatomy, exam findings, home exercise program, plan of care, expectations  of PT and to avoid aggravating hip exercises  Person educated: Patient Education method: Explanation, Demonstration, Tactile cues, Verbal cues, and Handouts Education comprehension: verbalized understanding  HOME EXERCISE PROGRAM: Access Code: [email or web address] URL: [email or web address] Date: 01/20/2024 Prepared by: Kristeen Sar  Exercises - Supine Bridge with Pelvic Floor Contraction  - 1 x daily - 7 x weekly - 2 sets - 10 reps - Supine Figure 4 Piriformis Stretch  - 1 x daily - 7 x weekly - 2 sets - 10 reps - Active Straight Leg Raise with Quad Set  - 1 x daily - 7 x weekly - 2 sets - 10 reps - Quadruped Hip Flexion Mobilization With Movement: Band Lateral  - 1 x daily - 7 x weekly - 3 sets - 10 reps - Supine Hip Internal and External Rotation  - 1 x daily - 7 x weekly - 1 sets - 10 reps - Supine Bridge  - 1 x daily - 7 x weekly - 2 sets - 10 reps - Supine Dynamic Modified Thomas Quad and Hip Flexor Dynamic Stretch  - 1 x daily - 7 x weekly - 3 sets - 10 reps - 30 hold - Standing Hip External Rotation at Table  - 1 x daily - 7 x weekly - 1 sets - 5  reps - Seated Hip Flexor Stretch  - 1 x daily - 7 x weekly - 2 sets - 20 reps  ASSESSMENT:  CLINICAL IMPRESSION: Patient presents with 0/10 hip pain today. She has been standing more often at work and that has significantly reduced her pain at work. Treatment session focused on hip mobility and strengthening. Patient responded well with exercises today. PT monitored patient throughout and provided verbal and visual cues as needed. Updated HEP to include progressions. Patient will benefit from skilled PT to address the below impairments and improve overall function.       Patient is a 38 y.o. F who was seen today for physical therapy evaluation and treatment for right hip pain and dyspareunia after a couple of surgeries, hospitalization and a fall. She is globally weak, has  a lot of stiffness right hip and difficulty with walking short distances- 5 mins at work. She reported that she is sedentary at work and will look into getting a standing desk.   Exam findings are notable for upper chest breathing strategies, abdominal and right hip weakness. Patient demonstrates fair trunk mobility. It is difficult for patient to walk and have intercourse due to vaginal pain and right hip pain . Discussed findings with patient, educated patient on internal pelvic floor assessment next visit and HEP was initiated. Patient's quality of life has been affected, patient will benefit from physical therapy to address deficits, reduce pain in hip  and improve mobility  and quality of life.    OBJECTIVE IMPAIRMENTS: decreased activity tolerance, decreased coordination, decreased endurance, decreased mobility, decreased ROM, decreased strength, increased fascial restrictions, increased muscle spasms, impaired flexibility, impaired tone, improper body mechanics, postural dysfunction, and pain.   ACTIVITY LIMITATIONS: sitting and locomotion level  PARTICIPATION LIMITATIONS: interpersonal relationship, community  activity, and occupation  PERSONAL FACTORS: Time since onset of injury/illness/exacerbation are also affecting patient's functional outcome.   REHAB POTENTIAL: Good  CLINICAL DECISION MAKING: Evolving/moderate complexity  EVALUATION COMPLEXITY: Moderate   GOALS: Goals reviewed with patient? Yes  SHORT TERM GOALS: Target date: 12/31/2023    Pt will be independent with HEP.   Baseline: Goal status: INITIAL  2.  Patient will be educated  on oh nuts to reduce pelvic pain with intercourse Baseline:  Goal status: INITIAL  3.  Patient will demonstrate improved right hip strength to 4/5 Baseline:  Goal status: INITIAL  4.  Patient will be able to walk at least 15 mins without increase right hip pain Baseline: 4-/5 flexion Goal status: INITIAL   LONG TERM GOALS: Target date: 06/05/2023  Pt will be independent with advanced HEP.   Baseline:  Goal status: INITIAL  2.  Patient will be able to walk at least for 30 mins without increased pain Baseline:  Goal status: INITIAL  3.  Patient will have 5/5 stretch right hip Baseline:  Goal status: INITIAL  4.  Good AROM/ PROM right hip to normal Baseline:  Goal status: INITIAL  5.  No pain with intercourse with boyfriend Baseline: 10/10 Goal status: INITIAL  6.  Return to the gym without increased hip pain Baseline:  Goal status: INITIAL  PLAN:  PT FREQUENCY: 1-2x/week  PT DURATION: 6 months   PLANNED INTERVENTIONS: 97164- PT Re-evaluation, 97110-Therapeutic exercises, 97530- Therapeutic activity, 97112- Neuromuscular re-education, 97535- Self Care, [id number]- Manual therapy, [phone]- Gait training, [phone]- Aquatic Therapy, [phone]- Electrical stimulation (unattended), [phone]- Traction (mechanical), [id number]- Ionotophoresis 4mg /ml Dexamethasone , 79439 (1-2 muscles), [id number] (3+ muscles)- Dry Needling, Patient/Family education, Balance training, Taping, Joint mobilization, Joint manipulation, Spinal manipulation, Spinal mobilization,  Scar mobilization, Vestibular training, Cryotherapy, Moist heat, and Biofeedback  PLAN FOR NEXT SESSION: ask about tolerance to treatment session; continue lateral hip mobs, hip strengthening and flexibility   Kristeen Sar, PT 01/20/24 4:22 PM  PHYSICAL THERAPY DISCHARGE SUMMARY  Visits from Start of Care: 4  Current functional level related to goals / functional outcomes: Patient is being discharge due to having surgery.   Patient agrees to discharge. Patient goals were partially met. Patient is being discharged due to a change in medical status.

## 2024-01-21 ENCOUNTER — Encounter: Admitting: Physical Therapy

## 2024-01-21 ENCOUNTER — Inpatient Hospital Stay: Attending: Gynecologic Oncology | Admitting: Gynecologic Oncology

## 2024-01-21 ENCOUNTER — Other Ambulatory Visit (HOSPITAL_COMMUNITY)
Admission: RE | Admit: 2024-01-21 | Discharge: 2024-01-21 | Disposition: A | Source: Ambulatory Visit | Attending: Gynecologic Oncology | Admitting: Gynecologic Oncology

## 2024-01-21 ENCOUNTER — Encounter: Payer: Self-pay | Admitting: Gynecologic Oncology

## 2024-01-21 VITALS — BP 113/57 | HR 60 | Temp 98.0°F | Resp 19 | Wt 224.4 lb

## 2024-01-21 DIAGNOSIS — N898 Other specified noninflammatory disorders of vagina: Secondary | ICD-10-CM | POA: Diagnosis not present

## 2024-01-21 DIAGNOSIS — C539 Malignant neoplasm of cervix uteri, unspecified: Secondary | ICD-10-CM

## 2024-01-21 DIAGNOSIS — R102 Pelvic and perineal pain unspecified side: Secondary | ICD-10-CM | POA: Diagnosis not present

## 2024-01-21 DIAGNOSIS — Z8541 Personal history of malignant neoplasm of cervix uteri: Secondary | ICD-10-CM | POA: Diagnosis not present

## 2024-01-21 DIAGNOSIS — D072 Carcinoma in situ of vagina: Secondary | ICD-10-CM | POA: Diagnosis not present

## 2024-01-21 DIAGNOSIS — Z90710 Acquired absence of both cervix and uterus: Secondary | ICD-10-CM | POA: Diagnosis not present

## 2024-01-21 DIAGNOSIS — Z9079 Acquired absence of other genital organ(s): Secondary | ICD-10-CM | POA: Insufficient documentation

## 2024-01-21 NOTE — Progress Notes (Signed)
 Gynecologic Oncology Return Clinic Visit  01/21/24  Reason for Visit: Surveillance   Treatment History: Patient has a long history of abnormal uterine bleeding refractory to medical management.  History also notable for cervical dysplasia.  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  Repeat Pap in 02/2022 was again HSIL, HPV 16+.  Patient was also found to be positive for chlamydia and trichomoniasis at that time.  Cold knife cone biopsy was performed on 03/18/2022 revealing CIN-2-3 with extension into underlying endocervical glands, negative for invasive carcinoma.  Endocervical margin was positive for high-grade dysplasia.  STD testing in March 2024 again showed patient positive for chlamydia and trichomoniasis.   More recently, Pap test on 10/13/2022 was HSIL, high risk HPV positive.  2 cervical biopsies both showed CIN-1, ECC showed CIN-2-3.   In the setting of abnormal uterine bleeding, the patient was offered definitive surgical management and on 12/16/2022 underwent TV[facility] lateral salpingectomy.  This included a 10-week sized uterus with normal tubes and ovaries.  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  Lymphovascular invasion not identified.  All margins were negative for invasive carcinoma and high-grade dysplasia.   02/11/23: Robotic bilateral pelvic lymphadenectomy  Interval History: Bowel function has improved.  Taking MiraLAX  daily with a formed daily bowel movement.  Antibiotics helped symptoms some.  Still feeling daily intermittent pelvic pain.  She is having trouble distinguishing if this is related to her previous pelvic pain, issues with her bowels, or hip pain.  Started Voltaren  which has helped some, using this twice daily.  Appetite has been up and down.  Denies any vaginal bleeding or discharge.  Past Medical/Surgical History: Past  Medical History:  Diagnosis Date   Anxiety    Depression    Fatty liver    Gestational diabetes    metformin  - resolved after delivery   Hiatal hernia    History of UTI    HSV (herpes simplex virus) infection    Migraines    Peripheral vascular disease    Post-operative nausea and vomiting 12/19/2022   Seizures (HCC)    Jan.2016 - only had one seizure, none since   Sleep apnea    Vaginal Pap smear, abnormal     Past Surgical History:  Procedure Laterality Date   ABDOMINAL HYSTERECTOMY  12/16/2022   BIOPSY  12/24/2022   Procedure: BIOPSY;  Surgeon: Leigh Elspeth SQUIBB, MD;  Location: [facility] ENDOSCOPY;  Service: Gastroenterology;;   BONE BIOPSY  12/12/2023   Procedure: BIOPSY, GI;  Surgeon: Federico Rosario BROCKS, MD;  Location: [facility] ENDOSCOPY;  Service: Gastroenterology;;   COLONOSCOPY N/A 12/12/2023   Procedure: COLONOSCOPY;  Surgeon: Federico Rosario BROCKS, MD;  Location: [facility] ENDOSCOPY;  Service: Gastroenterology;  Laterality: N/A;   DILATION AND CURETTAGE OF UTERUS     ESOPHAGOGASTRODUODENOSCOPY (EGD) WITH PROPOFOL  N/A 12/24/2022   Procedure: ESOPHAGOGASTRODUODENOSCOPY (EGD) WITH PROPOFOL ;  Surgeon: Leigh Elspeth SQUIBB, MD;  Location[facility] ENDOSCOPY;  Service: Gastroenterology;  Laterality: N/A;   LAPAROSCOPIC TUBAL LIGATION Bilateral 12/08/2018   Procedure: LAPAROSCOPIC TUBAL LIGATION;  Surgeon: Alger Gong, MD;  Location: [facility];  Service: Gynecology;  Laterality: Bilateral;   LEEP N/A 03/18/2022   Procedure: LOOP ELECTROSURGICAL EXCISION PROCEDURE (LEEP);  Surgeon: Alger Gong, MD;  Location: [facility] OR;  Service: Gynecology;  Laterality: N/A;   POLYPECTOMY  12/12/2023   Procedure: POLYPECTOMY, INTESTINE;  Surgeon: Federico Rosario BROCKS, MD;  Location: [facility] ENDOSCOPY;  Service: Gastroenterology;;   TUBAL LIGATION     VAGINAL HYSTERECTOMY N/A 12/16/2022   Procedure: TOTAL VAGINAL HYSTERECTOMY, BILATERAL SALPINGECTOMY;  Surgeon: Lorence Ozell CROME, MD;  Location: [facility] OR;  Service:  Gynecology;  Laterality: N/A;   WISDOM TOOTH EXTRACTION  04/21/2009    Family History  Problem Relation Age of Onset   Diabetes Mother    Stroke Mother    Colon cancer Father 51   Cancer Father    Hypertension Maternal Grandmother    Diabetes Maternal Grandmother    Hypertension Paternal Grandmother    Esophageal cancer Neg Hx    Liver disease Neg Hx     Social History   Socioeconomic History   Marital status: Single    Spouse name: Not on file   Number of children: 4   Years of education: college   Highest education level: Some college, no degree  Occupational History    Employer: [employer]    Comment: Tax inspector  Tobacco Use   Smoking status: Former    Current packs/day: 0.25    Average packs/day: 0.3 packs/day for 12.1 years (3.0 ttl pk-yrs)    Types: Cigarettes    Start date: 12/21/2011    Passive exposure: Never   Smokeless tobacco: Never  Vaping Use   Vaping status: Never Used  Substance and Sexual Activity   Alcohol use: Yes    Comment: ocassionally    Drug use: Not Currently    Types: Marijuana    Comment: Last use was in 2023   Sexual activity: Not Currently    Partners: Male    Birth control/protection: Surgical    Comment: Tubal Ligation  Other Topics Concern   Not on file  Social History Narrative   Lives with her grandmother and her three children   Does not drink caffeine    Social Drivers of Corporate investment banker Strain: Low Risk  (10/16/2023)   Overall Financial Resource Strain (CARDIA)    Difficulty of Paying Living Expenses: Not hard at all  Food Insecurity: No Food Insecurity (12/08/2023)   Hunger Vital Sign    Worried About Running Out of Food in the Last Year: Never true    Ran Out of Food in the Last Year: Never true  Transportation Needs: No Transportation Needs (12/08/2023)   PRAPARE - Administrator, Civil Service (Medical): No    Lack of Transportation (Non-Medical): No  Physical Activity:  Sufficiently Active (10/16/2023)   Exercise Vital Sign    Days of Exercise per Week: 4 days    Minutes of Exercise per Session: 120 min  Stress: Stress Concern Present (10/16/2023)   [employer] of Occupational Health - Occupational Stress Questionnaire    Feeling of Stress: To some extent  Social Connections: Moderately Isolated (10/16/2023)   Social Connection and Isolation Panel    Frequency of Communication with Friends and Family: More than three times a week    Frequency of Social Gatherings with Friends and Family: Patient declined    Attends Religious Services: 1 to 4 times per year    Active Member of [employer] or Organizations: No    Attends Engineer, structural: Not on file    Marital Status: Never married    Current Medications:  Current Outpatient Medications:    acetaminophen  (TYLENOL ) 500 MG tablet, Take 1,000 mg by mouth every 6 (six) hours as needed for moderate pain (pain  score 4-6), fever or headache., Disp: , Rfl:    diclofenac  (VOLTAREN ) 75 MG EC tablet, Take 1 tablet (75 mg total) by mouth 2 (two) times daily., Disp: 60 tablet, Rfl: 3   dicyclomine  (BENTYL ) 10 MG capsule, Take 1 capsule (10 mg total) by mouth 3 (three) times daily as needed for spasms., Disp: 30 capsule, Rfl: 1   omeprazole  (PRILOSEC OTC) 20 MG tablet, Take 20 mg by mouth daily., Disp: , Rfl:    ondansetron  (ZOFRAN ) 4 MG tablet, Take 1 tablet (4 mg total) by mouth every 8 (eight) hours as needed for nausea or vomiting., Disp: 20 tablet, Rfl: 0   oxyCODONE  (OXY IR/ROXICODONE ) 5 MG immediate release tablet, Take 1 tablet (5 mg total) by mouth every 4 (four) hours as needed for moderate pain (pain score 4-6)., Disp: 14 tablet, Rfl: 0   polyethylene glycol (MIRALAX  / GLYCOLAX ) 17 g packet, Take 17 g by mouth daily as needed for moderate constipation., Disp: , Rfl:   Review of Systems: Denies fevers, chills, fatigue, unexplained weight changes. Denies hearing loss, neck lumps or masses, mouth  sores, ringing in ears or voice changes. Denies cough or wheezing.  Denies shortness of breath. Denies chest pain or palpitations. Denies leg swelling. Denies abdominal distention, blood in stools, diarrhea, nausea, vomiting, or early satiety. Denies pain with intercourse, dysuria, frequency, hematuria or incontinence. Denies hot flashes, vaginal bleeding or vaginal discharge.   Denies joint pain, back pain or muscle pain/cramps. Denies itching, rash, or wounds. Denies dizziness, headaches, numbness or seizures. Denies swollen lymph nodes or glands, denies easy bruising or bleeding. Denies anxiety, depression, confusion, or decreased concentration.  Physical Exam: BP (!) 113/57 (BP Location: Left Arm, Patient Position: Sitting)   Pulse 60   Temp 98 F (36.7 C) (Oral)   Resp 19   Wt 224 lb 6.4 oz (101.8 kg)   LMP  (LMP Unknown) Comment: Depo Injections - last injection 10/2022, had spotting on and off since 10/2022 injection  SpO2 100%   BMI 32.20 kg/m  General: Alert, oriented, no acute distress. HEENT: Posterior oropharynx clear, sclera anicteric. Chest: Clear to auscultation bilaterally.  No wheezes or rhonchi. Cardiovascular: Regular rate and rhythm, no murmurs. Abdomen: soft, nontender.  Normoactive bowel sounds.  No masses or hepatosplenomegaly appreciated.  Well-healed incisions. Extremities: Grossly normal range of motion.  Warm, well perfused.  No edema bilaterally. Skin: No rashes or lesions noted. Lymphatics: No cervical, supraclavicular, or inguinal adenopathy. GU: Normal appearing external genitalia without erythema, excoriation, or lesions.  Speculum exam reveals well-rugated vagina.  At the cuff, there is an area along the mid left portion of the cuff that looks somewhat cobblestone in appearance more prominent than at her last visit.  With manipulation using a Q-tip, this area is firm, very mildly friable.  Pap and HPV collected.  Bimanual exam reveals this area to be  somewhat firm and thickened.  Rectovaginal exam, the rectum is somewhat tethered at this area.  The patient has some discomfort both on bimanual exam and rectovaginal exam with palpation of this firm area.   Vaginal cuff biopsy procedure Preoperative diagnosis: Vaginal cuff lesion Postoperative diagnosis: Same as above Physician: [name] MD Estimated blood loss: Minimal Specimens: Vaginal cuff biopsy Procedure: After the vaginal cuff had been palpated and visualized, recommendation was made to proceed with biopsy again.  Patient gave verbal consent.  She was already in dorsolithotomy position.  The speculum was replaced in the vagina and the cuff well-visualized.  This was  cleansed with Betadine   x3.  Tischler forceps were then used to biopsy the cuff.  Pressure and silver nitrate was used to achieve hemostasis.  This was placed in formalin.  Overall the patient tolerated the procedure well.  All instruments were removed from the vagina.  Laboratory & Radiologic Studies: None new  Assessment & Plan: MALOREE UPLINGER is a 38 y.o. 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  S/p staging LND in 01/2023. No adjuvant treatment (did not meet Sedlis criteria). PDL1 CPS 0%. Vaginal cuff biopsy 09/2023: squamous mucosa with focal small area of atypical squamous epithelium, not present on deeper (may represent reactive epithelium, not diagnostic of malignancy).  Recent imaging in 11/2023 showed diffusely thickened walls of the colon suggestive of colitis.  Pelvic ultrasound last week, there was interval resolution of previously seen complex left ovarian cyst, new 3.5 cm complex right ovarian cyst most characteristic of a benign hemorrhagic cyst.   Repeat biopsy performed today given appearance of the left aspect of the vagina.  I am somewhat suspicious that this is at least contributing to her pelvic pain given that I can reproduce pain on bimanual and rectovaginal exam.  I am concerned that there is  some tethering of the rectum at this area.  If biopsy comes back negative for cancer, I agree with pursuing treatment for recent findings on her hip x-ray.  If this does not lead to improvement in pelvic symptoms, we discussed interventions that could be performed including trigger point or pudendal injection, discussion of larger surgery (although discussed that this could be potentially somewhat morbid given adhesive disease).   Per NCCN surveillance recommendations, will continue with a visit and exam every 6 months.  I reviewed signs and symptoms that would be concerning for cancer recurrence and stressed the importance of calling if she develops any of these between visits.    Plan for annual cotesting, performed today.  22 minutes of total time was spent for this patient encounter, including preparation, face-to-face counseling with the patient and coordination of care, and documentation of the encounter.  Comer Dollar, MD  Division of Gynecologic Oncology  Department of Obstetrics and Gynecology  [facility]

## 2024-01-21 NOTE — Patient Instructions (Signed)
 It was good to see you today.  I will call you with biopsy results when I have them next week.  I will tentatively plan to see you in 6 months but if biopsy comes back negative for any precancer or cancer and your Pap test looks okay, please keep me posted if treatment for your hips does not improve the pain that you are having.  In this case, we could talk about either a procedure to inject some local numbing medicine into the area to see if this helps with your pain or discuss a bigger surgery given the thickened tissue at the top of the vagina.

## 2024-01-22 ENCOUNTER — Telehealth: Payer: Self-pay | Admitting: *Deleted

## 2024-01-22 ENCOUNTER — Other Ambulatory Visit: Payer: Self-pay | Admitting: Gynecologic Oncology

## 2024-01-22 ENCOUNTER — Ambulatory Visit: Payer: Self-pay | Admitting: Obstetrics and Gynecology

## 2024-01-22 ENCOUNTER — Ambulatory Visit: Payer: Self-pay | Admitting: Family

## 2024-01-22 DIAGNOSIS — C539 Malignant neoplasm of cervix uteri, unspecified: Secondary | ICD-10-CM

## 2024-01-22 DIAGNOSIS — N83201 Unspecified ovarian cyst, right side: Secondary | ICD-10-CM

## 2024-01-22 LAB — SURGICAL PATHOLOGY

## 2024-01-22 NOTE — Telephone Encounter (Signed)
 Per Dr [name] patient scheduled for a MRI and PET on 10/20 at [facility] starting at 7 am. Patient given date/time and instructions of the appts. Patient to be NPO after midnight

## 2024-01-22 NOTE — Progress Notes (Signed)
 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  She had imaging July.  Given change in biopsy, discussed getting updated imaging.  If imaging confirms local recurrence, I think surgical resection would be feasible.  Plan to do robotic upper vaginectomy.  Discussed colon adhesions and tethering felt on exam that may require colon surgery.  I will coordinate with one of our colorectal surgeons so that if this is necessary, we can hopefully do it all robotically.  Comer Dollar MD Gynecologic Oncology

## 2024-01-22 NOTE — Telephone Encounter (Signed)
 error

## 2024-01-27 ENCOUNTER — Encounter: Admitting: Physical Therapy

## 2024-01-27 LAB — CYTOLOGY - PAP
Adequacy: ABSENT
Comment: NEGATIVE
Comment: NEGATIVE
Comment: NEGATIVE
Diagnosis: HIGH — AB
HPV 16: POSITIVE — AB
HPV 18 / 45: NEGATIVE
High risk HPV: POSITIVE — AB

## 2024-01-28 ENCOUNTER — Other Ambulatory Visit: Payer: Self-pay

## 2024-01-28 ENCOUNTER — Telehealth: Payer: Self-pay

## 2024-01-28 ENCOUNTER — Emergency Department (HOSPITAL_COMMUNITY)
Admission: EM | Admit: 2024-01-28 | Discharge: 2024-01-28 | Disposition: A | Discharge status: Home / Self Care | Attending: Emergency Medicine | Admitting: Emergency Medicine

## 2024-01-28 ENCOUNTER — Ambulatory Visit: Payer: Self-pay | Admitting: Gynecologic Oncology

## 2024-01-28 ENCOUNTER — Encounter: Admitting: Physical Therapy

## 2024-01-28 ENCOUNTER — Emergency Department (HOSPITAL_COMMUNITY)

## 2024-01-28 DIAGNOSIS — R112 Nausea with vomiting, unspecified: Secondary | ICD-10-CM | POA: Diagnosis not present

## 2024-01-28 DIAGNOSIS — Z8541 Personal history of malignant neoplasm of cervix uteri: Secondary | ICD-10-CM | POA: Diagnosis not present

## 2024-01-28 DIAGNOSIS — Z90710 Acquired absence of both cervix and uterus: Secondary | ICD-10-CM | POA: Diagnosis not present

## 2024-01-28 DIAGNOSIS — K529 Noninfective gastroenteritis and colitis, unspecified: Secondary | ICD-10-CM | POA: Diagnosis not present

## 2024-01-28 DIAGNOSIS — N3090 Cystitis, unspecified without hematuria: Secondary | ICD-10-CM | POA: Diagnosis not present

## 2024-01-28 DIAGNOSIS — K760 Fatty (change of) liver, not elsewhere classified: Secondary | ICD-10-CM | POA: Diagnosis not present

## 2024-01-28 DIAGNOSIS — R1032 Left lower quadrant pain: Secondary | ICD-10-CM | POA: Diagnosis present

## 2024-01-28 DIAGNOSIS — R1030 Lower abdominal pain, unspecified: Secondary | ICD-10-CM | POA: Diagnosis not present

## 2024-01-28 LAB — I-STAT CHEM 8, ED
BUN: 8 mg/dL (ref 6–20)
Calcium, Ion: 1.16 mmol/L (ref 1.15–1.40)
Chloride: 104 mmol/L (ref 98–111)
Creatinine, Ser: 0.8 mg/dL (ref 0.44–1.00)
Glucose, Bld: 138 mg/dL — ABNORMAL HIGH (ref 70–99)
HCT: 39 % (ref 36.0–46.0)
Hemoglobin: 13.3 g/dL (ref 12.0–15.0)
Potassium: 3.3 mmol/L — ABNORMAL LOW (ref 3.5–5.1)
Sodium: 139 mmol/L (ref 135–145)
TCO2: 21 mmol/L — ABNORMAL LOW (ref 22–32)

## 2024-01-28 LAB — URINALYSIS, W/ REFLEX TO CULTURE (INFECTION SUSPECTED)
Bilirubin Urine: NEGATIVE
Glucose, UA: >=500 mg/dL — AB
Hgb urine dipstick: NEGATIVE
Ketones, ur: 5 mg/dL — AB
Nitrite: POSITIVE — AB
Protein, ur: NEGATIVE mg/dL
Specific Gravity, Urine: 1.013 (ref 1.005–1.030)
pH: 9 — ABNORMAL HIGH (ref 5.0–8.0)

## 2024-01-28 LAB — CBC
HCT: 38.9 % (ref 36.0–46.0)
Hemoglobin: 12.5 g/dL (ref 12.0–15.0)
MCH: 30.3 pg (ref 26.0–34.0)
MCHC: 32.1 g/dL (ref 30.0–36.0)
MCV: 94.2 fL (ref 80.0–100.0)
Platelets: 361 K/uL (ref 150–400)
RBC: 4.13 MIL/uL (ref 3.87–5.11)
RDW: 12.6 % (ref 11.5–15.5)
WBC: 13.3 K/uL — ABNORMAL HIGH (ref 4.0–10.5)
nRBC: 0 % (ref 0.0–0.2)

## 2024-01-28 LAB — HCG, SERUM, QUALITATIVE: Preg, Serum: NEGATIVE

## 2024-01-28 MED ORDER — SODIUM CHLORIDE 0.9 % IV SOLN
12.5000 mg | Freq: Once | INTRAVENOUS | Status: AC
  Administered 2024-01-28: 12.5 mg via INTRAVENOUS
  Filled 2024-01-28: qty 12.5

## 2024-01-28 MED ORDER — MORPHINE SULFATE (PF) 4 MG/ML IV SOLN
4.0000 mg | Freq: Once | INTRAVENOUS | Status: AC
  Administered 2024-01-28: 4 mg via INTRAVENOUS
  Filled 2024-01-28: qty 1

## 2024-01-28 MED ORDER — ONDANSETRON HCL 4 MG/2ML IJ SOLN
4.0000 mg | Freq: Once | INTRAMUSCULAR | Status: DC | PRN
  Filled 2024-01-28: qty 2

## 2024-01-28 MED ORDER — SULFAMETHOXAZOLE-TRIMETHOPRIM 800-160 MG PO TABS
1.0000 | ORAL_TABLET | Freq: Two times a day (BID) | ORAL | 0 refills | Status: AC
  Filled 2024-01-28: qty 14, 7d supply, fill #0

## 2024-01-28 MED ORDER — IOHEXOL 300 MG/ML  SOLN
100.0000 mL | Freq: Once | INTRAMUSCULAR | Status: AC | PRN
  Administered 2024-01-28: 100 mL via INTRAVENOUS

## 2024-01-28 MED ORDER — ONDANSETRON HCL 4 MG/2ML IJ SOLN
INTRAMUSCULAR | Status: AC
  Filled 2024-01-28: qty 2

## 2024-01-28 MED ORDER — PROMETHAZINE HCL 25 MG PO TABS
25.0000 mg | ORAL_TABLET | Freq: Four times a day (QID) | ORAL | 0 refills | Status: AC | PRN
Start: 2024-01-28 — End: ?
  Filled 2024-01-28: qty 30, 8d supply, fill #0

## 2024-01-28 MED ORDER — DICYCLOMINE HCL 10 MG PO CAPS
10.0000 mg | ORAL_CAPSULE | Freq: Once | ORAL | Status: AC
  Administered 2024-01-28: 10 mg via ORAL
  Filled 2024-01-28: qty 1

## 2024-01-28 MED ORDER — ONDANSETRON HCL 4 MG/2ML IJ SOLN
4.0000 mg | Freq: Once | INTRAMUSCULAR | Status: AC
  Administered 2024-01-28: 4 mg via INTRAVENOUS
  Filled 2024-01-28: qty 2

## 2024-01-28 MED ORDER — SULFAMETHOXAZOLE-TRIMETHOPRIM 800-160 MG PO TABS
1.0000 | ORAL_TABLET | Freq: Once | ORAL | Status: AC
  Administered 2024-01-28: 1 via ORAL
  Filled 2024-01-28: qty 1

## 2024-01-28 MED ORDER — KETOROLAC TROMETHAMINE 15 MG/ML IJ SOLN
15.0000 mg | Freq: Once | INTRAMUSCULAR | Status: AC
Start: 2024-01-28 — End: 2024-01-28
  Administered 2024-01-28: 15 mg via INTRAVENOUS
  Filled 2024-01-28: qty 1

## 2024-01-28 NOTE — ED Triage Notes (Signed)
 Patient c/o LLQ abdominal pain x 2 hours ago. Patient report nausea and vomiting x 3 today. Patient report taking pain medication with no relief. Patient denies Chest pain and SOB.

## 2024-01-28 NOTE — ED Notes (Signed)
 Patient informed that urine sample is needed. Toileting offered. Patient states unable to void at this time

## 2024-01-28 NOTE — ED Notes (Signed)
 Attempted IV x 1. Patient requested IVUS.

## 2024-01-28 NOTE — Discharge Instructions (Addendum)
 While you were in the emergency room, you had blood work done and CT imaging.  You do have some inflammation of your colon.  I have sent you some Phenergan  which you may use as needed for abdominal pain.  I have also sent you a prescription for Bactrim  to treat a UTI.  You may take Bactrim  once in the morning and once in the evening for infection.  Follow-up with your primary care doctor within 1 week.  Return to the emergency room if you develop inability eat or drink, or sudden worsening pain in your abdomen.

## 2024-01-28 NOTE — ED Notes (Signed)
 Pt difficult IV stick. US  attempted 2x. Meds held d/t IV infiltration. IV team consult placed.

## 2024-01-28 NOTE — Telephone Encounter (Signed)
 Per [name] NP, I reached out to Ms. Hopping regarding surgery date of 10/22. Pre-op appointments need to be scheduled (see note below)

## 2024-01-28 NOTE — ED Notes (Signed)
 Pt unable to provide urine sample at this time

## 2024-01-28 NOTE — Telephone Encounter (Signed)
 Holly Brown is aware of surgery date being 10/22. She is scheduled for a phone pre-op with Dr.Tucker on 10/10 @ 12:00. Pre-op with [name] NP is scheduled for 10/14 @ 10:30.   Pt agrees to date/time

## 2024-01-28 NOTE — Telephone Encounter (Signed)
-----   Message from Eleanor JONETTA Epps sent at 01/28/2024  8:38 AM EDT ----- Please call the patient and let her know we are planning for surgery on February 10, 2024 at [facility]. I think she will need to have a preop call set up with Dr. [name] before this time then will need a preop in the office with Darice or myself.

## 2024-01-28 NOTE — ED Provider Notes (Signed)
 [place] EMERGENCY DEPARTMENT AT [facility] - Closed Provider Note  CSN: [id number] Arrival date & time: 01/28/24 1718  Chief Complaint(s) Abdominal Pain  HPI Holly Brown is a 38 y.o. female here today for abdominal pain.  Patient endorses having left lower quadrant abdominal pain, nausea, vomiting diarrhea.  Symptoms started 2 hours ago.  She has a history of colitis, states this is similar.  Patient with a history of cervical malignancy, scheduled for gynecological surgery on the 22nd of this month.  Past Medical History Past Medical History:  Diagnosis Date   Anxiety    Depression    Fatty liver    Gestational diabetes    metformin  - resolved after delivery   Hiatal hernia    History of UTI    HSV (herpes simplex virus) infection    Migraines    Peripheral vascular disease    Post-operative nausea and vomiting 12/19/2022   Seizures (HCC)    Jan.2016 - only had one seizure, none since   Sleep apnea    Vaginal Pap smear, abnormal    Patient Active Problem List   Diagnosis Date Noted   Hiatal hernia 12/20/2023   Colitis 10/25/2023   Lactic acidosis 10/25/2023   History of cervical cancer 10/25/2023   Hypokalemia 10/25/2023   Blurry vision, bilateral 10/16/2023   Abnormal uterine bleeding (AUB) 10/16/2023   Diarrhea 09/04/2023   Frequent urination 09/04/2023   Lower abdominal pain 07/24/2023   Left arm weakness 05/08/2023   History of cervical dysplasia 05/08/2023   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   Left sided sciatica 04/23/2023   Low magnesium  level 04/07/2023   Vaginal irritation 01/28/2023   Cervical cancer (HCC) 12/29/2022   Duodenal ulcer 12/24/2022   S/P hysterectomy 12/19/2022   History of syncope 12/10/2022   Chronic pain of left knee 12/10/2022   Family history of colon cancer 10/10/2015   Home Medication(s) Prior to Admission medications   Medication Sig Start Date End Date Taking? Authorizing Provider  acetaminophen   (TYLENOL ) 500 MG tablet Take 1,000 mg by mouth every 6 (six) hours as needed for moderate pain (pain score 4-6), fever or headache.    [provider]  diclofenac  (VOLTAREN ) 75 MG EC tablet Take 1 tablet (75 mg total) by mouth 2 (two) times daily. 12/28/23   [name], Dinah C, NP  dicyclomine  (BENTYL ) 10 MG capsule Take 1 capsule (10 mg total) by mouth 3 (three) times daily as needed for spasms. 12/13/23   Christobal Guadalajara, MD  omeprazole  (PRILOSEC OTC) 20 MG tablet Take 20 mg by mouth daily.    [provider]  ondansetron  (ZOFRAN ) 4 MG tablet Take 1 tablet (4 mg total) by mouth every 8 (eight) hours as needed for nausea or vomiting. 12/13/23   Cheryle Page, MD  oxyCODONE  (OXY IR/ROXICODONE ) 5 MG immediate release tablet Take 1 tablet (5 mg total) by mouth every 4 (four) hours as needed for moderate pain (pain score 4-6). 12/13/23   Cheryle Page, MD  polyethylene glycol (MIRALAX  / GLYCOLAX ) 17 g packet Take 17 g by mouth daily as needed for moderate constipation. 12/13/23   Cheryle Page, MD  Past Surgical History Past Surgical History:  Procedure Laterality Date   ABDOMINAL HYSTERECTOMY  12/16/2022   BIOPSY  12/24/2022   Procedure: BIOPSY;  Surgeon: Leigh Elspeth SQUIBB, MD;  Location: [facility] ENDOSCOPY;  Service: Gastroenterology;;   BONE BIOPSY  12/12/2023   Procedure: BIOPSY, GI;  Surgeon: Federico Rosario BROCKS, MD;  Location: [facility] ENDOSCOPY;  Service: Gastroenterology;;   COLONOSCOPY N/A 12/12/2023   Procedure: COLONOSCOPY;  Surgeon: Federico Rosario BROCKS, MD;  Location: [facility] ENDOSCOPY;  Service: Gastroenterology;  Laterality: N/A;   DILATION AND CURETTAGE OF UTERUS     ESOPHAGOGASTRODUODENOSCOPY (EGD) WITH PROPOFOL  N/A 12/24/2022   Procedure: ESOPHAGOGASTRODUODENOSCOPY (EGD) WITH PROPOFOL ;  Surgeon: Leigh Elspeth SQUIBB, MD;  Location: [facility] ENDOSCOPY;  Service: Gastroenterology;   Laterality: N/A;   LAPAROSCOPIC TUBAL LIGATION Bilateral 12/08/2018   Procedure: LAPAROSCOPIC TUBAL LIGATION;  Surgeon: Alger Gong, MD;  Location: [facility];  Service: Gynecology;  Laterality: Bilateral;   LEEP N/A 03/18/2022   Procedure: LOOP ELECTROSURGICAL EXCISION PROCEDURE (LEEP);  Surgeon: Alger Gong, MD;  Location: [facility] OR;  Service: Gynecology;  Laterality: N/A;   POLYPECTOMY  12/12/2023   Procedure: POLYPECTOMY, INTESTINE;  Surgeon: Federico Rosario BROCKS, MD;  Location: [facility] ENDOSCOPY;  Service: Gastroenterology;;   TUBAL LIGATION     VAGINAL HYSTERECTOMY N/A 12/16/2022   Procedure: TOTAL VAGINAL HYSTERECTOMY, BILATERAL SALPINGECTOMY;  Surgeon: Lorence Ozell CROME, MD;  Location: [facility] OR;  Service: Gynecology;  Laterality: N/A;   WISDOM TOOTH EXTRACTION  04/21/2009   Family History Family History  Problem Relation Age of Onset   Diabetes Mother    Stroke Mother    Colon cancer Father 17   Cancer Father    Hypertension Maternal Grandmother    Diabetes Maternal Grandmother    Hypertension Paternal Grandmother    Esophageal cancer Neg Hx    Liver disease Neg Hx     Social History Social History   Tobacco Use   Smoking status: Former    Current packs/day: 0.25    Average packs/day: 0.3 packs/day for 12.1 years (3.0 ttl pk-yrs)    Types: Cigarettes    Start date: 12/21/2011    Passive exposure: Never   Smokeless tobacco: Never  Vaping Use   Vaping status: Never Used  Substance Use Topics   Alcohol use: Yes    Comment: ocassionally    Drug use: Not Currently    Types: Marijuana    Comment: Last use was in 2023   Allergies Keflex  [cephalexin ]  Review of Systems Review of Systems  Physical Exam Vital Signs  I have reviewed the triage vital signs BP (!) 149/89   Pulse 62   Temp 98.3 F (36.8 C) (Oral)   Resp 16   LMP  (LMP Unknown) Comment: Depo Injections - last injection 10/2022, had spotting on and off since 10/2022 injection  SpO2 97%    Physical Exam Vitals and nursing note reviewed.  Abdominal:     General: Abdomen is flat.     Tenderness: There is abdominal tenderness in the left lower quadrant.  Genitourinary:    Adnexa: Right adnexa normal and left adnexa normal.  Neurological:     Mental Status: She is alert.     ED Results and Treatments Labs (all labs ordered are listed, but only abnormal results are displayed) Labs Reviewed  URINALYSIS, W/ REFLEX TO CULTURE (INFECTION SUSPECTED) - Abnormal; Notable for the following components:      Result Value   APPearance HAZY (*)    pH 9.0 (*)  Glucose, UA >=500 (*)    Ketones, ur 5 (*)    Nitrite POSITIVE (*)    Leukocytes,Ua SMALL (*)    Bacteria, UA MANY (*)    All other components within normal limits  CBC - Abnormal; Notable for the following components:   WBC 13.3 (*)    All other components within normal limits  I-STAT CHEM 8, ED - Abnormal; Notable for the following components:   Potassium 3.3 (*)    Glucose, Bld 138 (*)    TCO2 21 (*)    All other components within normal limits  URINE CULTURE  HCG, SERUM, QUALITATIVE  COMPREHENSIVE METABOLIC PANEL WITH GFR  LIPASE, BLOOD                                                                                                                          Radiology CT ABDOMEN PELVIS W CONTRAST Result Date: 01/28/2024 CLINICAL DATA:  Diverticulitis with complication suspected. Left lower quadrant abdominal pain starting 2 hours ago. Nausea and vomiting today. EXAM: CT ABDOMEN AND PELVIS WITH CONTRAST TECHNIQUE: Multidetector CT imaging of the abdomen and pelvis was performed using the standard protocol following bolus administration of intravenous contrast. RADIATION DOSE REDUCTION: This exam was performed according to the departmental dose-optimization program which includes automated exposure control, adjustment of the mA and/or kV according to patient size and/or use of iterative reconstruction technique.  CONTRAST:  OMNIPAQUE  IOHEXOL  300 MG/ML  SOLN COMPARISON:  12/08/2023 FINDINGS: Lower chest: Lung bases are clear. Hepatobiliary: Mild diffuse fatty infiltration. No focal liver lesions. Gallbladder and bile ducts are normal. Pancreas: Unremarkable. No pancreatic ductal dilatation or surrounding inflammatory changes. Spleen: Normal in size without focal abnormality. Adrenals/Urinary Tract: Adrenal glands are unremarkable. Kidneys are normal, without renal calculi, focal lesion, or hydronephrosis. Bladder is unremarkable. Stomach/Bowel: Stomach, small bowel, and colon are not abnormally distended. Under distention of the colon limits evaluation but there appears to be diffuse colonic wall thickening and edema likely indicating colitis. This could represent infectious or inflammatory colitis. Similar appearance to previous study. No pericolonic abscess. Appendix is normal. Vascular/Lymphatic: No significant vascular findings are present. No enlarged abdominal or pelvic lymph nodes. Reproductive: Uterus is surgically absent.  No adnexal masses. Other: No free air or free fluid in the abdomen. Abdominal wall musculature appears intact. Musculoskeletal: No acute or significant osseous findings. IMPRESSION: 1. Diffuse colonic wall thickening likely representing infectious or inflammatory colitis. Similar appearance to previous study. No bowel obstruction. 2. Mild diffuse fatty infiltration of the liver. Electronically Signed   By: Elsie Gravely M.D.   On: 01/28/2024 21:03    Pertinent labs & imaging results that were available during my care of the patient were reviewed by me and considered in my medical decision making (see MDM for details).  Medications Ordered in ED Medications  ondansetron  (ZOFRAN ) injection 4 mg (has no administration in time range)  promethazine  (PHENERGAN ) 12.5 mg in sodium chloride  0.9 % 50 mL IVPB (has  no administration in time range)  morphine  (PF) 4 MG/ML injection 4 mg (4 mg  Intravenous Given 01/28/24 1949)  ondansetron  (ZOFRAN ) injection 4 mg (4 mg Intravenous Given 01/28/24 1949)  iohexol  (OMNIPAQUE ) 300 MG/ML solution 100 mL (100 mLs Intravenous Contrast Given 01/28/24 2045)  morphine  (PF) 4 MG/ML injection 4 mg (4 mg Intravenous Given 01/28/24 2230)  dicyclomine  (BENTYL ) capsule 10 mg (10 mg Oral Given 01/28/24 2124)  ketorolac  (TORADOL ) 15 MG/ML injection 15 mg (15 mg Intravenous Given 01/28/24 2236)  sulfamethoxazole -trimethoprim  (BACTRIM  DS) 800-160 MG per tablet 1 tablet (1 tablet Oral Given 01/28/24 2236)                                                                                                                                     Procedures Procedures  (including critical care time)  Medical Decision Making / ED Course   This patient presents to the ED for concern of abdominal pain, this involves an extensive number of treatment options, and is a complaint that carries with it a high risk of complications and morbidity.  The differential diagnosis includes diverticulitis, colitis, less likely torsion, consider gynecological malignancy, enteritis, gastroenteritis.  MDM: Patient uncomfortable appearing on exam, has normal vital signs, afebrile.  With her history of malignancy, will obtain imaging of the abdomen pelvis.  Blood work and analgesia ordered.  Reviewed the patient's outpatient Gyn Onc office note.  Reassessment 10:45 PM-patient CT imaging showing colitis.  She also has UTI.  She has an allergy to cephalosporins.  In the event that this is an early pyelonephritis, I believe treating her with Bactrim  is a reasonable course.  Will send her prescription for Bactrim .  She will follow-up with her PCP.   Additional history obtained: -Additional history obtained from mother at bedside -External records from outside source obtained and reviewed including: Chart review including previous notes, labs, imaging, consultation notes   Lab Tests: -I  ordered, reviewed, and interpreted labs.   The pertinent results include:   Labs Reviewed  URINALYSIS, W/ REFLEX TO CULTURE (INFECTION SUSPECTED) - Abnormal; Notable for the following components:      Result Value   APPearance HAZY (*)    pH 9.0 (*)    Glucose, UA >=500 (*)    Ketones, ur 5 (*)    Nitrite POSITIVE (*)    Leukocytes,Ua SMALL (*)    Bacteria, UA MANY (*)    All other components within normal limits  CBC - Abnormal; Notable for the following components:   WBC 13.3 (*)    All other components within normal limits  I-STAT CHEM 8, ED - Abnormal; Notable for the following components:   Potassium 3.3 (*)    Glucose, Bld 138 (*)    TCO2 21 (*)    All other components within normal limits  URINE CULTURE  HCG, SERUM, QUALITATIVE  COMPREHENSIVE METABOLIC PANEL WITH GFR  LIPASE, BLOOD  Imaging Studies ordered: I ordered imaging studies including CT abdomen pelvis I independently visualized and interpreted imaging. I agree with the radiologist interpretation   Medicines ordered and prescription drug management: Meds ordered this encounter  Medications   ondansetron  (ZOFRAN ) injection 4 mg   DISCONTD: ondansetron  (ZOFRAN ) 4 MG/2ML injection    Rimando, Junior Cesa: cabinet override   morphine  (PF) 4 MG/ML injection 4 mg   ondansetron  (ZOFRAN ) injection 4 mg   iohexol  (OMNIPAQUE ) 300 MG/ML solution 100 mL   morphine  (PF) 4 MG/ML injection 4 mg   dicyclomine  (BENTYL ) capsule 10 mg   ketorolac  (TORADOL ) 15 MG/ML injection 15 mg   sulfamethoxazole -trimethoprim  (BACTRIM  DS) 800-160 MG per tablet 1 tablet   promethazine  (PHENERGAN ) 12.5 mg in sodium chloride  0.9 % 50 mL IVPB    -I have reviewed the patients home medicines and have made adjustments as needed     Cardiac Monitoring: The patient was maintained on a cardiac monitor.  I personally viewed and interpreted the cardiac monitored which showed an underlying rhythm of: Normal sinus rhythm  Social  Determinants of Health:  Factors impacting patients care include: Lack of access to primary care   Reevaluation: After the interventions noted above, I reevaluated the patient and found that they have :improved  Co morbidities that complicate the patient evaluation  Past Medical History:  Diagnosis Date   Anxiety    Depression    Fatty liver    Gestational diabetes    metformin  - resolved after delivery   Hiatal hernia    History of UTI    HSV (herpes simplex virus) infection    Migraines    Peripheral vascular disease    Post-operative nausea and vomiting 12/19/2022   Seizures (HCC)    Jan.2016 - only had one seizure, none since   Sleep apnea    Vaginal Pap smear, abnormal       Dispostion: I considered admission for this patient, however she is appropriate for outpatient management.     Final Clinical Impression(s) / ED Diagnoses Final diagnoses:  Colitis  Cystitis     @PCDICTATION @    Mannie Pac T, DO 01/28/24 2254

## 2024-01-29 ENCOUNTER — Telehealth: Payer: Self-pay | Admitting: *Deleted

## 2024-01-29 ENCOUNTER — Other Ambulatory Visit (HOSPITAL_COMMUNITY): Payer: Self-pay

## 2024-01-29 ENCOUNTER — Encounter: Payer: Self-pay | Admitting: Gynecologic Oncology

## 2024-01-29 ENCOUNTER — Inpatient Hospital Stay: Admitting: Gynecologic Oncology

## 2024-01-29 DIAGNOSIS — N390 Urinary tract infection, site not specified: Secondary | ICD-10-CM

## 2024-01-29 DIAGNOSIS — C539 Malignant neoplasm of cervix uteri, unspecified: Secondary | ICD-10-CM | POA: Diagnosis not present

## 2024-01-29 DIAGNOSIS — D072 Carcinoma in situ of vagina: Secondary | ICD-10-CM | POA: Diagnosis not present

## 2024-01-29 DIAGNOSIS — N898 Other specified noninflammatory disorders of vagina: Secondary | ICD-10-CM

## 2024-01-29 DIAGNOSIS — Z9889 Other specified postprocedural states: Secondary | ICD-10-CM

## 2024-01-29 NOTE — Progress Notes (Signed)
 Gynecologic Oncology Telehealth Note: Gyn-Onc  I connected with Holly Brown on 01/29/24 at 12:00 PM EDT by telephone and verified that I am speaking with the correct person using two identifiers.  I discussed the limitations, risks, security and privacy concerns of performing an evaluation and management service by telemedicine and the availability of in-person appointments. I also discussed with the patient that there may be a patient responsible charge related to this service. The patient expressed understanding and agreed to proceed.  Other persons participating in the visit and their role in the encounter: none.  Patient's location: Reader Provider's location: [facility],   Reason for Visit: treatment planning  Treatment History: Patient has a long history of abnormal uterine bleeding refractory to medical management.  History also notable for cervical dysplasia.  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  Repeat Pap in 02/2022 was again HSIL, HPV 16+.  Patient was also found to be positive for chlamydia and trichomoniasis at that time.  Cold knife cone biopsy was performed on 03/18/2022 revealing CIN-2-3 with extension into underlying endocervical glands, negative for invasive carcinoma.  Endocervical margin was positive for high-grade dysplasia.  STD testing in March 2024 again showed patient positive for chlamydia and trichomoniasis.   More recently, Pap test on 10/13/2022 was HSIL, high risk HPV positive.  2 cervical biopsies both showed CIN-1, ECC showed CIN-2-3.   In the setting of abnormal uterine bleeding, the patient was offered definitive surgical management and on 12/16/2022 underwent TVH lateral salpingectomy.  This included a 10-week sized uterus with normal tubes and ovaries.  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  Lymphovascular  invasion not identified.  All margins were negative for invasive carcinoma and high-grade dysplasia.   02/11/23: Robotic bilateral pelvic lymphadenectomy  Interval History: Overall feeling better than she was yesterday.  Went into the emergency department due to increasing abdominal pain/pelvic pain.  Sent home with antibiotics for UTI as well as antiemetic.  Past Medical/Surgical History: Past Medical History:  Diagnosis Date   Anxiety    Depression    Fatty liver    Gestational diabetes    metformin  - resolved after delivery   Hiatal hernia    History of UTI    HSV (herpes simplex virus) infection    Migraines    Peripheral vascular disease    Post-operative nausea and vomiting 12/19/2022   Seizures (HCC)    Jan.2016 - only had one seizure, none since   Sleep apnea    Vaginal Pap smear, abnormal     Past Surgical History:  Procedure Laterality Date   ABDOMINAL HYSTERECTOMY  12/16/2022   BIOPSY  12/24/2022   Procedure: BIOPSY;  Surgeon: Leigh Elspeth SQUIBB, MD;  Location: [facility];  Service: Gastroenterology;;   BONE BIOPSY  12/12/2023   Procedure: BIOPSY, GI;  Surgeon: Federico Rosario BROCKS, MD;  Location: [facility] ENDOSCOPY;  Service: Gastroenterology;;   COLONOSCOPY N/A 12/12/2023   Procedure: COLONOSCOPY;  Surgeon: Federico Rosario BROCKS, MD;  Location: [facility] ENDOSCOPY;  Service: Gastroenterology;  Laterality: N/A;   DILATION AND CURETTAGE OF UTERUS     ESOPHAGOGASTRODUODENOSCOPY (EGD) WITH PROPOFOL  N/A 12/24/2022   Procedure: ESOPHAGOGASTRODUODENOSCOPY (EGD) WITH PROPOFOL ;  Surgeon: Leigh Elspeth SQUIBB, MD;  Location: [facility] ENDOSCOPY;  Service: Gastroenterology;  Laterality: N/A;   LAPAROSCOPIC TUBAL LIGATION Bilateral 12/08/2018   Procedure: LAPAROSCOPIC TUBAL  LIGATION;  Surgeon: Alger Gong, MD;  Location: [facility];  Service: Gynecology;  Laterality: Bilateral;   LEEP N/A 03/18/2022   Procedure: LOOP ELECTROSURGICAL EXCISION PROCEDURE (LEEP);  Surgeon: Alger Gong,  MD;  Location: [facility] OR;  Service: Gynecology;  Laterality: N/A;   POLYPECTOMY  12/12/2023   Procedure: POLYPECTOMY, INTESTINE;  Surgeon: Federico Rosario BROCKS, MD;  Location: [facility] ENDOSCOPY;  Service: Gastroenterology;;   TUBAL LIGATION     VAGINAL HYSTERECTOMY N/A 12/16/2022   Procedure: TOTAL VAGINAL HYSTERECTOMY, BILATERAL SALPINGECTOMY;  Surgeon: Lorence Ozell CROME, MD;  Location: [facility] OR;  Service: Gynecology;  Laterality: N/A;   WISDOM TOOTH EXTRACTION  04/21/2009    Family History  Problem Relation Age of Onset   Diabetes Mother    Stroke Mother    Colon cancer Father 68   Cancer Father    Hypertension Maternal Grandmother    Diabetes Maternal Grandmother    Hypertension Paternal Grandmother    Esophageal cancer Neg Hx    Liver disease Neg Hx     Social History   Socioeconomic History   Marital status: Single    Spouse name: Not on file   Number of children: 4   Years of education: college   Highest education level: Some college, no degree  Occupational History    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  Tobacco Use   Smoking status: Former    Current packs/day: 0.25    Average packs/day: 0.3 packs/day for 12.1 years (3.0 ttl pk-yrs)    Types: Cigarettes    Start date: 12/21/2011    Passive exposure: Never   Smokeless tobacco: Never  Vaping Use   Vaping status: Never Used  Substance and Sexual Activity   Alcohol use: Yes    Comment: ocassionally    Drug use: Not Currently    Types: Marijuana    Comment: Last use was in 2023   Sexual activity: Not Currently    Partners: Male    Birth control/protection: Surgical    Comment: Tubal Ligation  Other Topics Concern   Not on file  Social History Narrative   Lives with her grandmother and her three children   Does not drink caffeine    Social Drivers of Corporate investment banker Strain: Low Risk  (10/16/2023)   Overall Financial Resource Strain (CARDIA)    Difficulty of Paying Living Expenses: Not hard at  all  Food Insecurity: No Food Insecurity (12/08/2023)   Hunger Vital Sign    Worried About Running Out of Food in the Last Year: Never true    Ran Out of Food in the Last Year: Never true  Transportation Needs: No Transportation Needs (12/08/2023)   PRAPARE - Administrator, Civil Service (Medical): No    Lack of Transportation (Non-Medical): No  Physical Activity: Sufficiently Active (10/16/2023)   Exercise Vital Sign    Days of Exercise per Week: 4 days    Minutes of Exercise per Session: 120 min  Stress: Stress Concern Present (10/16/2023)   [employer] of Occupational Health - Occupational Stress Questionnaire    Feeling of Stress: To some extent  Social Connections: Moderately Isolated (10/16/2023)   Social Connection and Isolation Panel    Frequency of Communication with Friends and Family: More than three times a week    Frequency of Social Gatherings with Friends and Family: Patient declined    Attends Religious Services: 1 to 4 times per year  Active Member of Clubs or Organizations: No    Attends Engineer, structural: Not on file    Marital Status: Never married    Current Medications:  Current Outpatient Medications:    acetaminophen  (TYLENOL ) 500 MG tablet, Take 1,000 mg by mouth every 6 (six) hours as needed for moderate pain (pain score 4-6), fever or headache., Disp: , Rfl:    diclofenac  (VOLTAREN ) 75 MG EC tablet, Take 1 tablet (75 mg total) by mouth 2 (two) times daily., Disp: 60 tablet, Rfl: 3   dicyclomine  (BENTYL ) 10 MG capsule, Take 1 capsule (10 mg total) by mouth 3 (three) times daily as needed for spasms., Disp: 30 capsule, Rfl: 1   omeprazole  (PRILOSEC OTC) 20 MG tablet, Take 20 mg by mouth daily., Disp: , Rfl:    ondansetron  (ZOFRAN ) 4 MG tablet, Take 1 tablet (4 mg total) by mouth every 8 (eight) hours as needed for nausea or vomiting., Disp: 20 tablet, Rfl: 0   oxyCODONE  (OXY IR/ROXICODONE ) 5 MG immediate release tablet, Take 1  tablet (5 mg total) by mouth every 4 (four) hours as needed for moderate pain (pain score 4-6)., Disp: 14 tablet, Rfl: 0   polyethylene glycol (MIRALAX  / GLYCOLAX ) 17 g packet, Take 17 g by mouth daily as needed for moderate constipation., Disp: , Rfl:    promethazine  (PHENERGAN ) 25 MG tablet, Take 1 tablet (25 mg total) by mouth every 6 (six) hours as needed for nausea or vomiting., Disp: 30 tablet, Rfl: 0   sulfamethoxazole -trimethoprim  (BACTRIM  DS) 800-160 MG tablet, Take 1 tablet by mouth 2 (two) times daily for 7 days., Disp: 14 tablet, Rfl: 0  Review of Symptoms: Pertinent positives as per HPI.  Physical Exam: Deferred given limitations of phone visit.  Laboratory & Radiologic Studies: A. 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   Assessment & Plan: Holly Brown is a 38 y.o. 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  S/p staging LND in 01/2023. No adjuvant treatment (did not meet Sedlis criteria). PDL1 CPS 0%. Vaginal cuff biopsy 09/2023: squamous mucosa with focal small area of atypical squamous epithelium, not present on deeper (may represent reactive epithelium, not diagnostic of malignancy). Repeat biopsy in early 01/2024 showed at least squamous cell carcinoma in situ, cannot rule out early invasion.  Patient is overall feeling better since getting home from the emergency department.  Urinalysis is concerning for UTI although culture still pending.  Discussed CT findings concerning for possible colitis, similar appearance to prior study.  No enlarged lymph nodes noted.  Discussed plan for surgery with the patient.  I have talked to Dr. Sheldon, who will be available on the day that we have her scheduled for surgery.  In the setting of not having received adjuvant radiation, discussed surgical resection of the upper vagina which I will plan to do robotically.  Also discussed possible vaginal approach although I worry  that this risk damaging colon which feels tethered to the posterior vagina.  Discussed plan for robotic upper vaginectomy.  Reviewed risks of surgery which include but are not limited to infection; wound separation; hernia; vaginal cuff separation, injury to adjacent organs such as bowel, bladder, blood vessels, ureters and nerves (specifically discussed bladder and rectal injury in detail); bleeding which may require blood transfusion; anesthesia risk; thromboembolic events; possible death; unforeseen complications; possible need for re-exploration; medical complications such as heart attack, stroke, pleural effusion and  pneumonia. The patient will receive DVT and antibiotic prophylaxis as indicated. She voiced a clear understanding. She had the opportunity to ask questions.   Advised that I will reach out to Dr. Sheldon regarding CT findings to see his thoughts about treating for colitis presumptively with antibiotics and delaying surgery versus moving forward with surgery as scheduled.  I discussed the assessment and treatment plan with the patient. The patient was provided with an opportunity to ask questions and all were answered. The patient agreed with the plan and demonstrated an understanding of the instructions.   The patient was advised to call back or see an in-person evaluation if the symptoms worsen or if the condition fails to improve as anticipated.   20 minutes of total time was spent for this patient encounter, including preparation, phone counseling with the patient and coordination of care, and documentation of the encounter.   Comer Dollar, MD  Division of Gynecologic Oncology  Department of Obstetrics and Gynecology  [facility]

## 2024-01-29 NOTE — Telephone Encounter (Signed)
 Per Dr [name] fax referral form and records to [facility] for Dr Sheldon

## 2024-01-29 NOTE — H&P (View-Only) (Signed)
 Gynecologic Oncology Telehealth Note: Gyn-Onc  I connected with Holly Brown on 01/29/24 at 12:00 PM EDT by telephone and verified that I am speaking with the correct person using two identifiers.  I discussed the limitations, risks, security and privacy concerns of performing an evaluation and management service by telemedicine and the availability of in-person appointments. I also discussed with the patient that there may be a patient responsible charge related to this service. The patient expressed understanding and agreed to proceed.  Other persons participating in the visit and their role in the encounter: none.  Patient's location: Reader Provider's location: [facility],   Reason for Visit: treatment planning  Treatment History: Patient has a long history of abnormal uterine bleeding refractory to medical management.  History also notable for cervical dysplasia.  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  Repeat Pap in 02/2022 was again HSIL, HPV 16+.  Patient was also found to be positive for chlamydia and trichomoniasis at that time.  Cold knife cone biopsy was performed on 03/18/2022 revealing CIN-2-3 with extension into underlying endocervical glands, negative for invasive carcinoma.  Endocervical margin was positive for high-grade dysplasia.  STD testing in March 2024 again showed patient positive for chlamydia and trichomoniasis.   More recently, Pap test on 10/13/2022 was HSIL, high risk HPV positive.  2 cervical biopsies both showed CIN-1, ECC showed CIN-2-3.   In the setting of abnormal uterine bleeding, the patient was offered definitive surgical management and on 12/16/2022 underwent TVH lateral salpingectomy.  This included a 10-week sized uterus with normal tubes and ovaries.  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  Lymphovascular  invasion not identified.  All margins were negative for invasive carcinoma and high-grade dysplasia.   02/11/23: Robotic bilateral pelvic lymphadenectomy  Interval History: Overall feeling better than she was yesterday.  Went into the emergency department due to increasing abdominal pain/pelvic pain.  Sent home with antibiotics for UTI as well as antiemetic.  Past Medical/Surgical History: Past Medical History:  Diagnosis Date   Anxiety    Depression    Fatty liver    Gestational diabetes    metformin  - resolved after delivery   Hiatal hernia    History of UTI    HSV (herpes simplex virus) infection    Migraines    Peripheral vascular disease    Post-operative nausea and vomiting 12/19/2022   Seizures (HCC)    Jan.2016 - only had one seizure, none since   Sleep apnea    Vaginal Pap smear, abnormal     Past Surgical History:  Procedure Laterality Date   ABDOMINAL HYSTERECTOMY  12/16/2022   BIOPSY  12/24/2022   Procedure: BIOPSY;  Surgeon: Leigh Elspeth SQUIBB, MD;  Location: [facility];  Service: Gastroenterology;;   BONE BIOPSY  12/12/2023   Procedure: BIOPSY, GI;  Surgeon: Federico Rosario BROCKS, MD;  Location: [facility] ENDOSCOPY;  Service: Gastroenterology;;   COLONOSCOPY N/A 12/12/2023   Procedure: COLONOSCOPY;  Surgeon: Federico Rosario BROCKS, MD;  Location: [facility] ENDOSCOPY;  Service: Gastroenterology;  Laterality: N/A;   DILATION AND CURETTAGE OF UTERUS     ESOPHAGOGASTRODUODENOSCOPY (EGD) WITH PROPOFOL  N/A 12/24/2022   Procedure: ESOPHAGOGASTRODUODENOSCOPY (EGD) WITH PROPOFOL ;  Surgeon: Leigh Elspeth SQUIBB, MD;  Location: [facility] ENDOSCOPY;  Service: Gastroenterology;  Laterality: N/A;   LAPAROSCOPIC TUBAL LIGATION Bilateral 12/08/2018   Procedure: LAPAROSCOPIC TUBAL  LIGATION;  Surgeon: Alger Gong, MD;  Location: [facility];  Service: Gynecology;  Laterality: Bilateral;   LEEP N/A 03/18/2022   Procedure: LOOP ELECTROSURGICAL EXCISION PROCEDURE (LEEP);  Surgeon: Alger Gong,  MD;  Location: [facility];  Service: Gynecology;  Laterality: N/A;   POLYPECTOMY  12/12/2023   Procedure: POLYPECTOMY, INTESTINE;  Surgeon: Federico Rosario BROCKS, MD;  Location: [facility] ENDOSCOPY;  Service: Gastroenterology;;   TUBAL LIGATION     VAGINAL HYSTERECTOMY N/A 12/16/2022   Procedure: TOTAL VAGINAL HYSTERECTOMY, BILATERAL SALPINGECTOMY;  Surgeon: Lorence Ozell CROME, MD;  Location: [facility] OR;  Service: Gynecology;  Laterality: N/A;   WISDOM TOOTH EXTRACTION  04/21/2009    Family History  Problem Relation Age of Onset   Diabetes Mother    Stroke Mother    Colon cancer Father 68   Cancer Father    Hypertension Maternal Grandmother    Diabetes Maternal Grandmother    Hypertension Paternal Grandmother    Esophageal cancer Neg Hx    Liver disease Neg Hx     Social History   Socioeconomic History   Marital status: Single    Spouse name: Not on file   Number of children: 4   Years of education: college   Highest education level: Some college, no degree  Occupational History    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  Tobacco Use   Smoking status: Former    Current packs/day: 0.25    Average packs/day: 0.3 packs/day for 12.1 years (3.0 ttl pk-yrs)    Types: Cigarettes    Start date: 12/21/2011    Passive exposure: Never   Smokeless tobacco: Never  Vaping Use   Vaping status: Never Used  Substance and Sexual Activity   Alcohol use: Yes    Comment: ocassionally    Drug use: Not Currently    Types: Marijuana    Comment: Last use was in 2023   Sexual activity: Not Currently    Partners: Male    Birth control/protection: Surgical    Comment: Tubal Ligation  Other Topics Concern   Not on file  Social History Narrative   Lives with her grandmother and her three children   Does not drink caffeine    Social Drivers of Corporate investment banker Strain: Low Risk  (10/16/2023)   Overall Financial Resource Strain (CARDIA)    Difficulty of Paying Living Expenses: Not hard at  all  Food Insecurity: No Food Insecurity (12/08/2023)   Hunger Vital Sign    Worried About Running Out of Food in the Last Year: Never true    Ran Out of Food in the Last Year: Never true  Transportation Needs: No Transportation Needs (12/08/2023)   PRAPARE - Administrator, Civil Service (Medical): No    Lack of Transportation (Non-Medical): No  Physical Activity: Sufficiently Active (10/16/2023)   Exercise Vital Sign    Days of Exercise per Week: 4 days    Minutes of Exercise per Session: 120 min  Stress: Stress Concern Present (10/16/2023)   [employer] of Occupational Health - Occupational Stress Questionnaire    Feeling of Stress: To some extent  Social Connections: Moderately Isolated (10/16/2023)   Social Connection and Isolation Panel    Frequency of Communication with Friends and Family: More than three times a week    Frequency of Social Gatherings with Friends and Family: Patient declined    Attends Religious Services: 1 to 4 times per year  Active Member of Clubs or Organizations: No    Attends Engineer, structural: Not on file    Marital Status: Never married    Current Medications:  Current Outpatient Medications:    acetaminophen  (TYLENOL ) 500 MG tablet, Take 1,000 mg by mouth every 6 (six) hours as needed for moderate pain (pain score 4-6), fever or headache., Disp: , Rfl:    diclofenac  (VOLTAREN ) 75 MG EC tablet, Take 1 tablet (75 mg total) by mouth 2 (two) times daily., Disp: 60 tablet, Rfl: 3   dicyclomine  (BENTYL ) 10 MG capsule, Take 1 capsule (10 mg total) by mouth 3 (three) times daily as needed for spasms., Disp: 30 capsule, Rfl: 1   omeprazole  (PRILOSEC OTC) 20 MG tablet, Take 20 mg by mouth daily., Disp: , Rfl:    ondansetron  (ZOFRAN ) 4 MG tablet, Take 1 tablet (4 mg total) by mouth every 8 (eight) hours as needed for nausea or vomiting., Disp: 20 tablet, Rfl: 0   oxyCODONE  (OXY IR/ROXICODONE ) 5 MG immediate release tablet, Take 1  tablet (5 mg total) by mouth every 4 (four) hours as needed for moderate pain (pain score 4-6)., Disp: 14 tablet, Rfl: 0   polyethylene glycol (MIRALAX  / GLYCOLAX ) 17 g packet, Take 17 g by mouth daily as needed for moderate constipation., Disp: , Rfl:    promethazine  (PHENERGAN ) 25 MG tablet, Take 1 tablet (25 mg total) by mouth every 6 (six) hours as needed for nausea or vomiting., Disp: 30 tablet, Rfl: 0   sulfamethoxazole -trimethoprim  (BACTRIM  DS) 800-160 MG tablet, Take 1 tablet by mouth 2 (two) times daily for 7 days., Disp: 14 tablet, Rfl: 0  Review of Symptoms: Pertinent positives as per HPI.  Physical Exam: Deferred given limitations of phone visit.  Laboratory & Radiologic Studies: A. 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   Assessment & Plan: Holly Brown is a 38 y.o. 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  S/p staging LND in 01/2023. No adjuvant treatment (did not meet Sedlis criteria). PDL1 CPS 0%. Vaginal cuff biopsy 09/2023: squamous mucosa with focal small area of atypical squamous epithelium, not present on deeper (may represent reactive epithelium, not diagnostic of malignancy). Repeat biopsy in early 01/2024 showed at least squamous cell carcinoma in situ, cannot rule out early invasion.  Patient is overall feeling better since getting home from the emergency department.  Urinalysis is concerning for UTI although culture still pending.  Discussed CT findings concerning for possible colitis, similar appearance to prior study.  No enlarged lymph nodes noted.  Discussed plan for surgery with the patient.  I have talked to Dr. Sheldon, who will be available on the day that we have her scheduled for surgery.  In the setting of not having received adjuvant radiation, discussed surgical resection of the upper vagina which I will plan to do robotically.  Also discussed possible vaginal approach although I worry  that this risk damaging colon which feels tethered to the posterior vagina.  Discussed plan for robotic upper vaginectomy.  Reviewed risks of surgery which include but are not limited to infection; wound separation; hernia; vaginal cuff separation, injury to adjacent organs such as bowel, bladder, blood vessels, ureters and nerves (specifically discussed bladder and rectal injury in detail); bleeding which may require blood transfusion; anesthesia risk; thromboembolic events; possible death; unforeseen complications; possible need for re-exploration; medical complications such as heart attack, stroke, pleural effusion and  pneumonia. The patient will receive DVT and antibiotic prophylaxis as indicated. She voiced a clear understanding. She had the opportunity to ask questions.   Advised that I will reach out to Dr. Sheldon regarding CT findings to see his thoughts about treating for colitis presumptively with antibiotics and delaying surgery versus moving forward with surgery as scheduled.  I discussed the assessment and treatment plan with the patient. The patient was provided with an opportunity to ask questions and all were answered. The patient agreed with the plan and demonstrated an understanding of the instructions.   The patient was advised to call back or see an in-person evaluation if the symptoms worsen or if the condition fails to improve as anticipated.   20 minutes of total time was spent for this patient encounter, including preparation, phone counseling with the patient and coordination of care, and documentation of the encounter.   Comer Dollar, MD  Division of Gynecologic Oncology  Department of Obstetrics and Gynecology  [facility]

## 2024-01-31 LAB — URINE CULTURE: Culture: >=100000 — AB

## 2024-02-01 ENCOUNTER — Telehealth (HOSPITAL_BASED_OUTPATIENT_CLINIC_OR_DEPARTMENT_OTHER): Payer: Self-pay | Admitting: *Deleted

## 2024-02-01 NOTE — Progress Notes (Unsigned)
 Patient here for a pre-operative appointment prior to her scheduled surgery on 02/10/24. She is scheduled for robotic assisted upper vaginectomy, possible bowel surgery.  She has her pre-admission testing appointment this Thursday at [facility].  The surgery was discussed in detail.  See after visit summary for additional details.    Discussed post-op pain management in detail including the aspects of the enhanced recovery pathway.  Advised her that a new prescription would be sent in for oxycodone  and it is only to be used for after her upcoming surgery.  We discussed the use of tylenol  post-op and to monitor for a maximum of 4,000 mg in a 24 hour period.  Also discussed bowel regimen and advised this would be determined post-op based on the procedure being performed.     Discussed measures to take at home to prevent DVT including frequent mobility.  Reportable signs and symptoms of DVT discussed. Post-operative instructions discussed and expectations for after surgery. Incisional care discussed as well including reportable signs and symptoms including erythema, drainage, wound separation.     10 minutes spent preparing information and with the patient.  Verbalizing understanding of material discussed. No needs or concerns voiced at the end of the visit.   Advised patient to call for any needs.  Advised that her post-operative medication had been prescribed and could be picked up at any time.    This appointment is included in the global surgical bundle as pre-operative teaching and has no charge.

## 2024-02-01 NOTE — Patient Instructions (Signed)
 Preparing for your Surgery  Plan for surgery on February 10, 2024 with Dr. Comer Dollar at [facility]. You will be scheduled for robotic assisted laparoscopic upper vaginectomy, possible bowel surgery.   Pre-operative Testing -You will receive a phone call from presurgical testing at [facility] to arrange for a pre-operative appointment and lab work.  -Bring your insurance card, copy of an advanced directive if applicable, medication list  -At that visit, you will be asked to sign a consent for a possible blood transfusion in case a transfusion becomes necessary during surgery.  The need for a blood transfusion is rare but having consent is a necessary part of your care.     -You should not be taking blood thinners or aspirin at least ten days prior to surgery unless instructed by your surgeon.  -Do not take supplements such as fish oil (omega 3), red yeast rice, turmeric before your surgery. STOP TAKING AT LEAST 10 DAYS BEFORE SURGERY. You want to avoid medications with aspirin in them including headache powders such as BC or Goody's), Excedrin migraine.  -If you are taking a GLP-1 medication/injection such as Ozempic, Mounjaro, [id number], this needs to be held before surgery for at least 7 days before.  Day Before Surgery at Home -You will be asked to take in a light diet the day before surgery. You will be advised you can have clear liquids up until 3 hours before your surgery.    Eat a light diet the day before surgery.  Examples including soups, broths, toast, yogurt, mashed potatoes.  AVOID GAS PRODUCING FOODS AND BEVERAGES. Things to avoid include carbonated beverages (fizzy beverages, sodas), raw fruits and raw vegetables (uncooked), or beans.   If your bowels are filled with gas, your surgeon will have difficulty visualizing your pelvic organs which increases your surgical risks.  Your role in recovery Your role is to become active as soon as directed by your  doctor, while still giving yourself time to heal.  Rest when you feel tired. You will be asked to do the following in order to speed your recovery:  - Cough and breathe deeply. This helps to clear and expand your lungs and can prevent pneumonia after surgery.  - STAY ACTIVE WHEN YOU GET HOME. Do mild physical activity. Walking or moving your legs help your circulation and body functions return to normal. Do not try to get up or walk alone the first time after surgery.   -If you develop swelling on one leg or the other, pain in the back of your leg, redness/warmth in one of your legs, please call the office or go to the Emergency Room to have a doppler to rule out a blood clot. For shortness of breath, chest pain-seek care in the Emergency Room as soon as possible. - Actively manage your pain. Managing your pain lets you move in comfort. We will ask you to rate your pain on a scale of zero to 10. It is your responsibility to tell your doctor or nurse where and how much you hurt so your pain can be treated.  Special Considerations -If you are diabetic, you may be placed on insulin after surgery to have closer control over your blood sugars to promote healing and recovery.  This does not mean that you will be discharged on insulin.  If applicable, your oral antidiabetics will be resumed when you are tolerating a solid diet.  -Your final pathology results from surgery should be available around one  week after surgery and the results will be relayed to you when available.  -FMLA forms can be faxed to [phone] and please allow 5-7 business days for completion.  Pain Management After Surgery -You will be prescribed your pain medication and bowel regimen medications before surgery so that you can have these available when you are discharged from the hospital. The pain medication is for use ONLY AFTER surgery and a new prescription will not be given.   -Make sure that you have Tylenol  and Ibuprofen  IF  YOU ARE ABLE TO TAKE THESE MEDICATIONS at home to use on a regular basis after surgery for pain control. We recommend alternating the medications every hour to six hours since they work differently and are processed in the body differently for pain relief.  -Review the attached handout on narcotic use and their risks and side effects.   Bowel Regimen -You will be prescribed Sennakot-S to take nightly to prevent constipation especially if you are taking the narcotic pain medication intermittently.  It is important to prevent constipation and drink adequate amounts of liquids. You can stop taking this medication when you are not taking pain medication and you are back on your normal bowel routine.  Risks of Surgery Risks of surgery are low but include bleeding, infection, damage to surrounding structures, re-operation, blood clots, and very rarely death.   Blood Transfusion Information (For the consent to be signed before surgery)  We will be checking your blood type before surgery so in case of emergencies, we will know what type of blood you would need.                                            WHAT IS A BLOOD TRANSFUSION?  A transfusion is the replacement of blood or some of its parts. Blood is made up of multiple cells which provide different functions. Red blood cells carry oxygen and are used for blood loss replacement. White blood cells fight against infection. Platelets control bleeding. Plasma helps clot blood. Other blood products are available for specialized needs, such as hemophilia or other clotting disorders. BEFORE THE TRANSFUSION  Who gives blood for transfusions?  You may be able to donate blood to be used at a later date on yourself (autologous donation). Relatives can be asked to donate blood. This is generally not any safer than if you have received blood from a stranger. The same precautions are taken to ensure safety when a relative's blood is donated. Healthy  volunteers who are fully evaluated to make sure their blood is safe. This is blood bank blood. Transfusion therapy is the safest it has ever been in the practice of medicine. Before blood is taken from a donor, a complete history is taken to make sure that person has no history of diseases nor engages in risky social behavior (examples are intravenous drug use or sexual activity with multiple partners). The donor's travel history is screened to minimize risk of transmitting infections, such as malaria. The donated blood is tested for signs of infectious diseases, such as HIV and hepatitis. The blood is then tested to be sure it is compatible with you in order to minimize the chance of a transfusion reaction. If you or a relative donates blood, this is often done in anticipation of surgery and is not appropriate for emergency situations. It takes many days to process the  donated blood. RISKS AND COMPLICATIONS Although transfusion therapy is very safe and saves many lives, the main dangers of transfusion include:  Getting an infectious disease. Developing a transfusion reaction. This is an allergic reaction to something in the blood you were given. Every precaution is taken to prevent this. The decision to have a blood transfusion has been considered carefully by your caregiver before blood is given. Blood is not given unless the benefits outweigh the risks.  AFTER SURGERY INSTRUCTIONS  Return to work: 4-6 weeks if applicable  Activity: 1. Be up and out of the bed during the day.  Take a nap if needed.  You may walk up steps but be careful and use the hand rail.  Stair climbing will tire you more than you think, you may need to stop part way and rest.   2. No lifting or straining for 6 weeks over 10 pounds. No pushing, pulling, straining for 6 weeks.  3. No driving for 4-89 days when the following criteria have been met: Do not drive if you are taking narcotic pain medicine and make sure that your  reaction time has returned.   4. You can shower as soon as the next day after surgery. Shower daily.  Use your regular soap and water (not directly on the incision) and pat your incision(s) dry afterwards; don't rub.  No tub baths or submerging your body in water until cleared by your surgeon. If you have the soap that was given to you by pre-surgical testing that was used before surgery, you do not need to use it afterwards because this can irritate your incisions.   5. No sexual activity and nothing in the vagina for 12 weeks.  6. You may experience a small amount of clear drainage from your incisions, which is normal.  If the drainage persists, increases, or changes color please call the office.  7. Do not use creams, lotions, or ointments such as neosporin on your incisions after surgery until advised by your surgeon because they can cause removal of the dermabond glue on your incisions.    8. You may experience vaginal spotting after surgery or when the stitches at the top of the vagina begin to dissolve.  The spotting is normal but if you experience heavy bleeding, call our office.  9. Take Tylenol  or ibuprofen  first for pain if you are able to take these medications and only use narcotic pain medication for severe pain not relieved by the Tylenol  or Ibuprofen .  Monitor your Tylenol  intake to a max of 4,000 mg in a 24 hour period. You can alternate these medications after surgery.  Diet: 1. Low sodium Heart Healthy Diet is recommended but you are cleared to resume your normal (before surgery) diet after your procedure.  2. It is safe to use a laxative, such as Miralax  or Colace, if you have difficulty moving your bowels before surgery. You have been prescribed Sennakot-S to take at bedtime every evening after surgery to keep bowel movements regular and to prevent constipation.    Wound Care: 1. Keep clean and dry.  Shower daily.  Reasons to call the Doctor: Fever - Oral temperature  greater than 100.4 degrees Fahrenheit Foul-smelling vaginal discharge Difficulty urinating Nausea and vomiting Increased pain at the site of the incision that is unrelieved with pain medicine. Difficulty breathing with or without chest pain New calf pain especially if only on one side Sudden, continuing increased vaginal bleeding with or without clots.   Contacts: For  questions or concerns you should contact:  Dr. Comer Dollar at [phone]  [name], NP at [phone]  After Hours: call [phone] and have the GYN Oncologist paged/contacted (after 5 pm or on the weekends). You will speak with an after hours RN and let he or she know you have had surgery.  Messages sent via mychart are for non-urgent matters and are not responded to after hours so for urgent needs, please call the after hours number.

## 2024-02-01 NOTE — Telephone Encounter (Signed)
 Post ED Visit - Positive Culture Follow-up  Culture report reviewed by antimicrobial stewardship pharmacist: [facility] []  [address] Pharm.D. []  Venetia Gully, Pharm.D., BCPS AQ-ID []  Garrel Crews, Pharm.D., BCPS []  Almarie Lunger, Pharm.D., BCPS []  [place], [address].D., BCPS, AAHIVP []  Rosaline Bihari, Pharm.D., BCPS, AAHIVP []  Vernell Meier, PharmD, BCPS []  Latanya Hint, PharmD, BCPS []  Donald Medley, PharmD, BCPS []  Rocky Bold, PharmD []  Dorothyann Alert, PharmD, BCPS []  Morene Babe, PharmD  [facility] [x]  Damien Quiet, PharmD []  Romona Bliss, PharmD []  Dolphus Roller, PharmD []  Veva Seip, Rph []  Vernell Daunt) Leonce, PharmD []  Eva Allis, PharmD []  Rosaline Millet, PharmD []  Iantha Batch, PharmD []  Arvin Gauss, PharmD []  Wanda Hasting, PharmD []  Ronal Rav, PharmD []  Rocky Slade, PharmD []  Bard Jeans, PharmD   Positive urine culture Treated with sulfamethoxazole - trimethoprim , organism sensitive to the same and no further patient follow-up is required at this time.  Holly Brown 02/01/2024, 9:59 AM

## 2024-02-02 ENCOUNTER — Ambulatory Visit: Admitting: Physician Assistant

## 2024-02-02 ENCOUNTER — Encounter: Payer: Self-pay | Admitting: Gynecologic Oncology

## 2024-02-02 ENCOUNTER — Other Ambulatory Visit (HOSPITAL_COMMUNITY): Payer: Self-pay

## 2024-02-02 ENCOUNTER — Inpatient Hospital Stay (HOSPITAL_BASED_OUTPATIENT_CLINIC_OR_DEPARTMENT_OTHER): Admitting: Gynecologic Oncology

## 2024-02-02 VITALS — BP 120/56 | HR 76 | Temp 99.2°F | Resp 16 | Ht 70.0 in | Wt 219.0 lb

## 2024-02-02 DIAGNOSIS — C539 Malignant neoplasm of cervix uteri, unspecified: Secondary | ICD-10-CM

## 2024-02-02 DIAGNOSIS — Z01818 Encounter for other preprocedural examination: Secondary | ICD-10-CM

## 2024-02-02 DIAGNOSIS — N898 Other specified noninflammatory disorders of vagina: Secondary | ICD-10-CM

## 2024-02-02 MED ORDER — OXYCODONE HCL 5 MG PO TABS
5.0000 mg | ORAL_TABLET | ORAL | 0 refills | Status: DC | PRN
  Filled 2024-02-02: qty 20, 4d supply, fill #0

## 2024-02-02 NOTE — Telephone Encounter (Signed)
 Called and spoke with [facility] at [facility] regarding the patient's needs for an appt with Dr Sheldon before joint surgery on 10/22. Per Dyanna she spoke with both Russell (Dr Sheldon nurse) and Dr Sheldon and I don't need to see her in the office. I can see her the day of surgery unless the patient is requesting an appt in my office before surgery.   Message forwarded to [name], APP and Dr [name]

## 2024-02-03 ENCOUNTER — Other Ambulatory Visit: Payer: Self-pay

## 2024-02-03 ENCOUNTER — Other Ambulatory Visit (HOSPITAL_COMMUNITY): Payer: Self-pay

## 2024-02-03 ENCOUNTER — Encounter: Admitting: Physical Therapy

## 2024-02-03 ENCOUNTER — Telehealth: Payer: Self-pay | Admitting: Oncology

## 2024-02-03 ENCOUNTER — Ambulatory Visit: Payer: Self-pay | Admitting: Surgery

## 2024-02-03 DIAGNOSIS — R739 Hyperglycemia, unspecified: Secondary | ICD-10-CM

## 2024-02-03 MED ORDER — BISACODYL EC 5 MG PO TBEC
DELAYED_RELEASE_TABLET | ORAL | 0 refills | Status: DC
  Filled 2024-02-03: qty 4, 1d supply, fill #0

## 2024-02-03 MED ORDER — ONDANSETRON HCL 4 MG PO TABS
ORAL_TABLET | ORAL | 2 refills | Status: DC
  Filled 2024-02-03: qty 2, 1d supply, fill #0

## 2024-02-03 MED ORDER — NEOMYCIN SULFATE 500 MG PO TABS
ORAL_TABLET | ORAL | 0 refills | Status: DC
  Filled 2024-02-03: qty 6, 1d supply, fill #0

## 2024-02-03 MED ORDER — POLYETHYLENE GLYCOL 3350 17 GM/SCOOP PO POWD
ORAL | 0 refills | Status: AC
  Filled 2024-02-03: qty 238, 1d supply, fill #0

## 2024-02-03 MED ORDER — METRONIDAZOLE 500 MG PO TABS
ORAL_TABLET | ORAL | 0 refills | Status: DC
  Filled 2024-02-03: qty 6, 1d supply, fill #0

## 2024-02-03 NOTE — Progress Notes (Signed)
 COVID Vaccine received:  []  No [x]  Yes Date of any COVID positive Test in last 90 days: none  PCP - Roxan Plough, NP  Cardiologist - non e   Chest x-ray - 11-19-2023  2v  Epic EKG - 12-10-23  Epic  Stress Test -  ECHO - 07-06-2014  Epic Cardiac Cath -  CT Coronary Calcium score:   Bowel Prep - []  No  [x]   Yes _CCS prep (Dr. Sheldon may do bowel surgery)  Pacemaker / ICD device [x]  No []  Yes   Spinal Cord Stimulator:[x]  No []  Yes       History of Sleep Apnea? []  No [x]  Yes   CPAP used?- [x]  No []  Yes    Patient has: [x]  NO Hx DM   []  Pre-DM   []  DM1  []   DM2 Does the patient monitor blood sugar?   [x]  N/A   []  No []  Yes  Last A1c was:        on       Blood Thinner / Instructions:  none Aspirin Instructions:  none  Dental hx: []  Dentures:  []  N/A      []  Bridge or Partial:                   [x]  Loose or Damaged teeth: one damaged tooth on top front  Activity level: Able to walk up 2 flights of stairs without becoming significantly short of breath or having chest pain?  []  No   [x]    Yes  Patient can perform ADLs without assistance. []  No   []   Yes  Anesthesia review: OSA- no CPAP, anemia, GERD, PONV, fatty liver, Seizures- last one 04-2014 (unknown etiology)  migraines  Patient denies any S&S of respiratory illness or Covid - no shortness of breath, fever, cough or chest pain at PAT appointment.  Patient verbalized understanding and agreement to the Pre-Surgical Instructions that were given to them at this PAT appointment. Patient was also educated of the need to review these PAT instructions again prior to her surgery.I reviewed the appropriate phone numbers to call if they have any and questions or concerns.

## 2024-02-03 NOTE — Telephone Encounter (Signed)
 Called Holly Brown and let her know that an appt with Dr. Sheldon in the office is not necessary per Dr. Sheldon. He will plan on talking to her the morning of. He is going to recommend a bowel prep and she should receive this information at her preop appt. Advised her call with any questions.

## 2024-02-03 NOTE — Patient Instructions (Signed)
 SURGICAL WAITING ROOM VISITATION Patients having surgery or a procedure may have no more than 2 support people in the waiting area - these visitors may rotate in the visitor waiting room.   If the patient needs to stay at the hospital during part of their recovery, the visitor guidelines for inpatient rooms apply.  PRE-OP VISITATION  Pre-op nurse will coordinate an appropriate time for 1 support person to accompany the patient in pre-op.  This support person may not rotate.  This visitor will be contacted when the time is appropriate for the visitor to come back in the pre-op area.  Please refer to the [facility] website for the visitor guidelines for Inpatients (after your surgery is over and you are in a regular room).  You are not required to quarantine at this time prior to your surgery. However, you must do this: Hand Hygiene often Do NOT share personal items Notify your provider if you are in close contact with someone who has COVID or you develop fever 100.4 or greater, new onset of sneezing, cough, sore throat, shortness of breath or body aches.  If you test positive for Covid or have been in contact with anyone that has tested positive in the last 10 days please notify you surgeon.    Your procedure is scheduled on:  Wednesday  February 10, 2024  Report to [facility] Main Entrance: [address] entrance where the [employer] is available.   Report to admitting at:  12:30  PM  Call this number if you have any questions or problems the morning of surgery [phone]  FOLLOW ANY ADDITIONAL PRE OP INSTRUCTIONS YOU RECEIVED FROM YOUR SURGEON'S OFFICE!!!  Dulcolax 20 mg (total) - Take 4 (four) of the 5 mg Dulcolax tablets with water at 07:00 am the day prior to surgery.  Miralax  255 g - Mix with 64 oz Gatorade/Powerade.  Starting at 10:00 am ,Drink this gradually over the next few hours (8 oz glass every 15-30 minutes) until gone the day prior to surgery You should finish in 4  hours-6 hours.    Neomycin 1000 mg - At 2 pm, 3 pm and 10 pm after Miralax   bowel prep the day prior to surgery.  Metronidazole  1000 mg - At 2 pm, 3 pm and 10 pm after Miralax  bowel prep the day prior to surgery.   Drink plenty of clear liquids all evening to avoid getting dehydrated.   DRINK two (2) bottles of Pre-Surgery Clear Ensure drink starting at 6:00 pm the evening prior to your surgery to help prevent dehydration. Increase drinking clear fluids (see list below)          Do not eat food after Midnight the night prior to your surgery/procedure.  After Midnight you may have the following liquids until  11:45 AM  DAY OF SURGERY  Clear Liquid Diet Water Black Coffee (sugar ok, NO MILK/CREAM OR CREAMERS)  Tea (sugar ok, NO MILK/CREAM OR CREAMERS) regular and decaf                             Plain Jell-O  with no fruit (NO RED)                                           Fruit ices (not with fruit pulp, NO RED)  Popsicles (NO RED)                                                                  Juice: NO CITRUS JUICES: only apple, WHITE grape, WHITE cranberry Sports drinks like Gatorade or Powerade (NO RED)                   The day of surgery:  Drink ONE (1) Pre-Surgery Clear Ensure at  11:45   AM the morning of surgery. Drink in one sitting. Do not sip.  This drink was given to you during your hospital pre-op appointment visit. Nothing else to drink after completing the Pre-Surgery Clear Ensure : No candy, chewing gum or throat lozenges.     Oral Hygiene is also important to reduce your risk of infection.        Remember - BRUSH YOUR TEETH THE MORNING OF SURGERY WITH YOUR REGULAR TOOTHPASTE  Do NOT smoke after Midnight the night before surgery.  STOP TAKING all Vitamins, Herbs and supplements 1 week before your surgery.   Take ONLY these medicines the morning of surgery with A SIP OF WATER: Omeprazole , Tylenol  if needed.   You may not  have any metal on your body including hair pins, jewelry, and body piercing  Do not wear make-up, lotions, powders, perfumes or deodorant  Do not wear nail polish including gel and S&S, artificial / acrylic nails, or any other type of covering on natural nails including finger and toenails. If you have artificial nails, gel coating, etc., that needs to be removed by a nail salon, Please have this removed prior to surgery. Not doing so may mean that your surgery could be cancelled or delayed if the Surgeon or anesthesia staff feels like they are unable to monitor you safely.   Do not shave 48 hours prior to surgery to avoid nicks in your skin which may contribute to postoperative infections.   Contacts, Hearing Aids, dentures or bridgework may not be worn into surgery. DENTURES WILL BE REMOVED PRIOR TO SURGERY PLEASE DO NOT APPLY Poly grip OR ADHESIVES!!!  You may bring a small overnight bag with you on the day of surgery, only pack items that are not valuable. [facility] IS NOT RESPONSIBLE   FOR VALUABLES THAT ARE LOST OR STOLEN.   Patients discharged on the day of surgery will not be allowed to drive home.  Someone NEEDS to stay with you for the first 24 hours after anesthesia.  Do not bring your home medications to the hospital. The Pharmacy will dispense medications listed on your medication list to you during your admission in the Hospital.  Special Instructions: Bring a copy of your healthcare power of attorney and living will documents the day of surgery, if you wish to have them scanned into your [facility] Records- EPIC  Please read over the following fact sheets you were given: IF YOU HAVE QUESTIONS ABOUT YOUR PRE-OP INSTRUCTIONS, PLEASE CALL [phone]   [facility] - Preparing for Surgery        Before surgery, you can play an important role.  Because skin is not sterile, your skin needs to be as free of germs as possible.  You can reduce the number of germs on your skin  by washing with CHG (chlorahexidine gluconate) soap before surgery.  CHG is an antiseptic cleaner which kills germs and bonds with the skin to continue killing germs even after washing. Please DO NOT use if you have an allergy to CHG or antibacterial soaps.  If your skin becomes reddened/irritated stop using the CHG and inform your nurse when you arrive at Short Stay. Do not shave (including legs and underarms) for at least 48 hours prior to the first CHG shower.  You may shave your face/neck.  Please follow these instructions carefully:  1.  Shower with CHG Soap the night before surgery ONLY (DO NOT USE THE CHG SOAP THE MORNING OF SURGERY).  2.  If you choose to wash your hair, wash your hair first as usual with your normal  shampoo.  3.  After you shampoo, rinse your hair and body thoroughly to remove the shampoo.                             4.  Use CHG as you would any other liquid soap.  You can apply chg directly to the skin and wash.  Gently with a scrungie or clean washcloth.  5.  Apply the CHG Soap to your body ONLY FROM THE NECK DOWN.   Do not use on face/ open                           Wound or open sores. Avoid contact with eyes, ears mouth and genitals (private parts).                       Wash face,  Genitals (private parts) with your normal soap.             6.  Wash thoroughly, paying special attention to the area where your  surgery  will be performed.  7.  Thoroughly rinse your body with warm water from the neck down.  8.  DO NOT shower/wash with your normal soap after using and rinsing off the CHG Soap.                9.  Pat yourself dry with a clean towel.            10.  Wear clean pajamas.            11.  Place clean sheets on your bed the night of your first shower and do not  sleep with pets.  Day of Surgery: Do not apply any CHG, lotions/deodorants the morning of surgery.  Please wear clean clothes to the hospital/surgery center.   FAILURE TO FOLLOW THESE INSTRUCTIONS  MAY RESULT IN THE CANCELLATION OF YOUR SURGERY  PATIENT SIGNATURE_________________________________  NURSE SIGNATURE__________________________________  ________________________________________________________________________        Nasario Exon    An incentive spirometer is a tool that can help keep your lungs clear and active. This tool measures how well you are filling your lungs with each breath. Taking long deep breaths may help reverse or decrease the chance of developing breathing (pulmonary) problems (especially infection) following: A long period of time when you are unable to move or be active. BEFORE THE PROCEDURE  If the spirometer includes an indicator to show your best effort, your nurse or respiratory therapist will set it to a desired goal. If possible, sit up straight or lean slightly forward. Try not to slouch.  Hold the incentive spirometer in an upright position. INSTRUCTIONS FOR USE  Sit on the edge of your bed if possible, or sit up as far as you can in bed or on a chair. Hold the incentive spirometer in an upright position. Breathe out normally. Place the mouthpiece in your mouth and seal your lips tightly around it. Breathe in slowly and as deeply as possible, raising the piston or the ball toward the top of the column. Hold your breath for 3-5 seconds or for as long as possible. Allow the piston or ball to fall to the bottom of the column. Remove the mouthpiece from your mouth and breathe out normally. Rest for a few seconds and repeat Steps 1 through 7 at least 10 times every 1-2 hours when you are awake. Take your time and take a few normal breaths between deep breaths. The spirometer may include an indicator to show your best effort. Use the indicator as a goal to work toward during each repetition. After each set of 10 deep breaths, practice coughing to be sure your lungs are clear. If you have an incision (the cut made at the time of surgery),  support your incision when coughing by placing a pillow or rolled up towels firmly against it. Once you are able to get out of bed, walk around indoors and cough well. You may stop using the incentive spirometer when instructed by your caregiver.  RISKS AND COMPLICATIONS Take your time so you do not get dizzy or light-headed. If you are in pain, you may need to take or ask for pain medication before doing incentive spirometry. It is harder to take a deep breath if you are having pain. AFTER USE Rest and breathe slowly and easily. It can be helpful to keep track of a log of your progress. Your caregiver can provide you with a simple table to help with this. If you are using the spirometer at home, follow these instructions: SEEK MEDICAL CARE IF:  You are having difficultly using the spirometer. You have trouble using the spirometer as often as instructed. Your pain medication is not giving enough relief while using the spirometer. You develop fever of 100.5 F (38.1 C) or higher.                                                                                                    SEEK IMMEDIATE MEDICAL CARE IF:  You cough up bloody sputum that had not been present before. You develop fever of 102 F (38.9 C) or greater. You develop worsening pain at or near the incision site. MAKE SURE YOU:  Understand these instructions. Will watch your condition. Will get help right away if you are not doing well or get worse. Document Released: 08/18/2006 Document Revised: 06/30/2011 Document Reviewed: 10/19/2006 [facility] Patient Information 2014 [place], [place].

## 2024-02-04 ENCOUNTER — Encounter (HOSPITAL_COMMUNITY): Payer: Self-pay

## 2024-02-04 ENCOUNTER — Encounter (HOSPITAL_COMMUNITY)
Admission: RE | Admit: 2024-02-04 | Discharge: 2024-02-04 | Disposition: A | Discharge status: Home / Self Care | Source: Ambulatory Visit | Attending: Gynecologic Oncology | Admitting: Gynecologic Oncology

## 2024-02-04 ENCOUNTER — Other Ambulatory Visit: Payer: Self-pay

## 2024-02-04 ENCOUNTER — Other Ambulatory Visit (HOSPITAL_COMMUNITY): Payer: Self-pay

## 2024-02-04 ENCOUNTER — Telehealth: Payer: Self-pay | Admitting: Physician Assistant

## 2024-02-04 DIAGNOSIS — R87629 Unspecified abnormal cytological findings in specimens from vagina: Secondary | ICD-10-CM | POA: Diagnosis not present

## 2024-02-04 DIAGNOSIS — R739 Hyperglycemia, unspecified: Secondary | ICD-10-CM | POA: Insufficient documentation

## 2024-02-04 DIAGNOSIS — Z01812 Encounter for preprocedural laboratory examination: Secondary | ICD-10-CM | POA: Insufficient documentation

## 2024-02-04 DIAGNOSIS — Z01818 Encounter for other preprocedural examination: Secondary | ICD-10-CM | POA: Diagnosis present

## 2024-02-04 DIAGNOSIS — Z8541 Personal history of malignant neoplasm of cervix uteri: Secondary | ICD-10-CM | POA: Diagnosis not present

## 2024-02-04 HISTORY — DX: Gastro-esophageal reflux disease without esophagitis: K21.9

## 2024-02-04 HISTORY — DX: Personal history of other diseases of the digestive system: Z87.19

## 2024-02-04 HISTORY — DX: Malignant (primary) neoplasm, unspecified: C80.1

## 2024-02-04 LAB — CBC
HCT: 40.3 % (ref 36.0–46.0)
Hemoglobin: 12.7 g/dL (ref 12.0–15.0)
MCH: 29.9 pg (ref 26.0–34.0)
MCHC: 31.5 g/dL (ref 30.0–36.0)
MCV: 94.8 fL (ref 80.0–100.0)
Platelets: 385 K/uL (ref 150–400)
RBC: 4.25 MIL/uL (ref 3.87–5.11)
RDW: 12.7 % (ref 11.5–15.5)
WBC: 7.7 K/uL (ref 4.0–10.5)
nRBC: 0 % (ref 0.0–0.2)

## 2024-02-04 LAB — COMPREHENSIVE METABOLIC PANEL WITH GFR
ALT: 14 U/L (ref 0–44)
AST: 17 U/L (ref 15–41)
Albumin: 4.4 g/dL (ref 3.5–5.0)
Alkaline Phosphatase: 63 U/L (ref 38–126)
Anion gap: 11 (ref 5–15)
BUN: 10 mg/dL (ref 6–20)
CO2: 23 mmol/L (ref 22–32)
Calcium: 9.5 mg/dL (ref 8.9–10.3)
Chloride: 105 mmol/L (ref 98–111)
Creatinine, Ser: 1.06 mg/dL — ABNORMAL HIGH (ref 0.44–1.00)
GFR, Estimated: >60 mL/min (ref >60)
Glucose, Bld: 84 mg/dL (ref 70–99)
Potassium: 3.5 mmol/L (ref 3.5–5.1)
Sodium: 139 mmol/L (ref 135–145)
Total Bilirubin: 0.7 mg/dL (ref 0.0–1.2)
Total Protein: 7.3 g/dL (ref 6.5–8.1)

## 2024-02-04 LAB — HEMOGLOBIN A1C
Hgb A1c MFr Bld: 4.7 % — ABNORMAL LOW (ref 4.8–5.6)
Mean Plasma Glucose: 88.19 mg/dL

## 2024-02-04 MED ORDER — METRONIDAZOLE 500 MG PO TABS
ORAL_TABLET | ORAL | 0 refills | Status: DC
  Filled 2024-02-04: qty 6, 3d supply, fill #0

## 2024-02-04 NOTE — Telephone Encounter (Signed)
 Inbound call from patient stating she is going to have to cancel appt on 02/11/24 with [facility] due to having surgery on 02/10/24. Patient wanting to inform provider she will be having surgery.  Please advise  Thank you

## 2024-02-04 NOTE — Telephone Encounter (Signed)
 Attempted to reach patient regarding cancellation of OV.  Advised in message to call us  back once she is recovered from surgery if she'd like to reschedule.  Sent message  through  active MyChart.

## 2024-02-05 ENCOUNTER — Encounter: Payer: Self-pay | Admitting: Physician Assistant

## 2024-02-05 ENCOUNTER — Ambulatory Visit: Admitting: Physician Assistant

## 2024-02-05 ENCOUNTER — Other Ambulatory Visit: Payer: Self-pay | Admitting: Medical Genetics

## 2024-02-05 DIAGNOSIS — M25551 Pain in right hip: Secondary | ICD-10-CM

## 2024-02-05 DIAGNOSIS — Z006 Encounter for examination for normal comparison and control in clinical research program: Secondary | ICD-10-CM

## 2024-02-05 DIAGNOSIS — M25851 Other specified joint disorders, right hip: Secondary | ICD-10-CM | POA: Insufficient documentation

## 2024-02-05 NOTE — Progress Notes (Signed)
 Office Visit Note   Patient: Holly Brown           Date of Birth: [birth year]           MRN: [id number] Visit Date: 02/05/2024              Requested by: Leonarda [name], NP [address] [place],  [place] [address] PCP: Leonarda [name], NP   Assessment & Plan: Visit Diagnoses:  1. Pain of right hip   2. Femoroacetabular impingement of right hip     Plan: Patient is a pleasant 38 year old woman with a 1 year history of right groin pain.  She denies any injuries.  She has a history of cervical cancer and has had a hysterectomy.  She does relate a slip and fall.  This was on a hard floor.  She has been taking Tylenol  as needed and muscle relaxers which helped a little bit.  She also has a painful popping in her hip.  She denies any radicular findings.  She had had x-rays of her hip which suggested femoral acetabular impingement.  She has tried physical therapy for the last 6 weeks or so and has not really seen much improvement.  Given her mechanical symptoms, as well as this being going on for a year, as well as failing PT.  I recommend an MRI and then follow-up with Dr. Genelle  Follow-Up Instructions: With Dr. Genelle after MRI  Orders:  Orders Placed This Encounter  Procedures   MR Hip Right w/o contrast   AMB referral to orthopedics   No orders of the defined types were placed in this encounter.     Procedures: No procedures performed   Clinical Data: No additional findings.   Subjective: Chief Complaint  Patient presents with   Right Hip - Pain   Right Knee - Pain    HPI pleasant 44 year old woman comes in today with a 1 year history of right hip pain.  Denies any back pain associated with this the pain starts in her groin and goes down towards her knee.  She has tried medication including Tylenol  and muscle relaxers.  She thinks that this may have happened when she had a slip and fall on hard floor.  She does get mechanical popping in the front of her hip.  She  has had x-rays and is here for follow-up she has been doing physical therapy but has not gotten much improvement physical therapy has been done within the last month  Review of Systems  All other systems reviewed and are negative.    Objective: Vital Signs: LMP  (LMP Unknown) Comment: Depo Injections - last injection 10/2022, had spotting on and off since 10/2022 injection  Physical Exam Constitutional:      Appearance: Normal appearance.  Pulmonary:     Effort: Pulmonary effort is normal.  Skin:    General: Skin is warm and dry.  Neurological:     General: No focal deficit present.     Mental Status: She is alert and oriented to person, place, and time.  Psychiatric:        Mood and Affect: Mood normal.        Behavior: Behavior normal.     Ortho Exam Examination of her right hip she has good range of motion she has no tenderness about the knee.  She has good strength no radicular findings negative straight leg raise.  She has no pain with flexion and external rotation of  the hip.  She does have pain with flexion and internal rotation of the hip reproduced in the groin.  She is neurovascularly intact Specialty Comments:  No specialty comments available.  Imaging: No results found.   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   Left sided sciatica 04/23/2023   Low magnesium  level 04/07/2023   Vaginal irritation 01/28/2023   Cervical cancer (HCC) 12/29/2022    Duodenal ulcer 12/24/2022   S/P hysterectomy 12/19/2022   History of syncope 12/10/2022   Chronic pain of left knee 12/10/2022   Family history of colon cancer 10/10/2015   Past Medical History:  Diagnosis Date   Anxiety    Cancer (HCC)    cervical cancer   Depression    Fatty liver    GERD (gastroesophageal reflux disease)    History of hiatal hernia    History of UTI    HSV (herpes simplex virus) infection    Migraines    Peripheral vascular disease    Post-operative nausea and vomiting 12/19/2022   Seizures (HCC)    Jan.2016 - only had one seizure, none since   Sleep apnea    no CPAP   Vaginal Pap smear, abnormal     Family History  Problem Relation Age of Onset   Diabetes Mother    Stroke Mother    Colon cancer Father 68   Cancer Father    Hypertension Maternal Grandmother    Diabetes Maternal Grandmother    Hypertension Paternal Grandmother    Esophageal cancer Neg Hx    Liver disease Neg Hx     Past Surgical History:  Procedure Laterality Date   BIOPSY  12/24/2022   Procedure: BIOPSY;  Surgeon: Leigh Elspeth SQUIBB, MD;  Location: [facility] ENDOSCOPY;  Service: Gastroenterology;;   BONE BIOPSY  12/12/2023   Procedure: BIOPSY, GI;  Surgeon: Federico Rosario BROCKS, MD;  Location: [facility] ENDOSCOPY;  Service: Gastroenterology;;   COLONOSCOPY N/A 12/12/2023   Procedure: COLONOSCOPY;  Surgeon: Federico Rosario BROCKS, MD;  Location: [facility] ENDOSCOPY;  Service: Gastroenterology;  Laterality: N/A;   DILATION AND CURETTAGE OF UTERUS     ESOPHAGOGASTRODUODENOSCOPY (EGD) WITH PROPOFOL  N/A 12/24/2022   Procedure: ESOPHAGOGASTRODUODENOSCOPY (EGD) WITH PROPOFOL ;  Surgeon: Leigh Elspeth SQUIBB, MD;  Location: [facility] ENDOSCOPY;  Service: Gastroenterology;  Laterality: N/A;   LAPAROSCOPIC TUBAL LIGATION Bilateral 12/08/2018   Procedure: LAPAROSCOPIC TUBAL LIGATION;  Surgeon: Alger Gong, MD;  Location: [facility];  Service: Gynecology;  Laterality: Bilateral;   LEEP N/A 03/18/2022    Procedure: LOOP ELECTROSURGICAL EXCISION PROCEDURE (LEEP);  Surgeon: Alger Gong, MD;  Location: [facility] OR;  Service: Gynecology;  Laterality: N/A;   MINI-LAPAROTOMY     after hysterectomy   POLYPECTOMY  12/12/2023   Procedure: POLYPECTOMY, INTESTINE;  Surgeon: Federico Rosario BROCKS, MD;  Location: [facility] ENDOSCOPY;  Service: Gastroenterology;;   TUBAL LIGATION     VAGINAL HYSTERECTOMY N/A 12/16/2022   Procedure: TOTAL VAGINAL HYSTERECTOMY, BILATERAL SALPINGECTOMY;  Surgeon: Lorence Ozell CROME, MD;  Location: [facility] OR;  Service:  Gynecology;  Laterality: N/A;   WISDOM TOOTH EXTRACTION  04/21/2009   Social History   Occupational History    Employer: [employer]    Comment: Tax inspector  Tobacco Use   Smoking status: Former    Current packs/day: 0.25    Average packs/day: 0.3 packs/day for 12.1 years (3.0 ttl pk-yrs)    Types: Cigarettes    Start date: 12/21/2011    Passive exposure: Never   Smokeless tobacco: Never  Vaping Use   Vaping status: Never Used  Substance and Sexual Activity   Alcohol use: Yes    Comment: ocassionally    Drug use: Not Currently    Types: Marijuana    Comment: Last use was in 2023   Sexual activity: Not Currently    Partners: Male    Birth control/protection: Surgical    Comment: Tubal Ligation

## 2024-02-08 ENCOUNTER — Ambulatory Visit (HOSPITAL_COMMUNITY)
Admission: RE | Admit: 2024-02-08 | Discharge: 2024-02-08 | Disposition: A | Discharge status: Home / Self Care | Source: Ambulatory Visit | Attending: Physician Assistant | Admitting: Physician Assistant

## 2024-02-08 ENCOUNTER — Ambulatory Visit (HOSPITAL_COMMUNITY)
Admission: RE | Admit: 2024-02-08 | Discharge: 2024-02-08 | Disposition: A | Discharge status: Home / Self Care | Source: Ambulatory Visit | Attending: Gynecologic Oncology | Admitting: Gynecologic Oncology

## 2024-02-08 DIAGNOSIS — M1611 Unilateral primary osteoarthritis, right hip: Secondary | ICD-10-CM | POA: Diagnosis not present

## 2024-02-08 DIAGNOSIS — M25551 Pain in right hip: Secondary | ICD-10-CM | POA: Insufficient documentation

## 2024-02-08 DIAGNOSIS — C539 Malignant neoplasm of cervix uteri, unspecified: Secondary | ICD-10-CM

## 2024-02-08 DIAGNOSIS — N898 Other specified noninflammatory disorders of vagina: Secondary | ICD-10-CM | POA: Diagnosis not present

## 2024-02-08 DIAGNOSIS — Z8541 Personal history of malignant neoplasm of cervix uteri: Secondary | ICD-10-CM | POA: Diagnosis not present

## 2024-02-08 DIAGNOSIS — R600 Localized edema: Secondary | ICD-10-CM | POA: Diagnosis not present

## 2024-02-08 DIAGNOSIS — Z90710 Acquired absence of both cervix and uterus: Secondary | ICD-10-CM | POA: Diagnosis not present

## 2024-02-08 LAB — GLUCOSE, CAPILLARY: Glucose-Capillary: 93 mg/dL (ref 70–99)

## 2024-02-08 MED ORDER — GADOBUTROL 1 MMOL/ML IV SOLN
10.0000 mL | Freq: Once | INTRAVENOUS | Status: AC | PRN
  Administered 2024-02-08: 10 mL via INTRAVENOUS

## 2024-02-08 MED ORDER — FLUDEOXYGLUCOSE F - 18 (FDG) INJECTION
10.9000 | Freq: Once | INTRAVENOUS | Status: AC
  Administered 2024-02-08: 10.9 via INTRAVENOUS

## 2024-02-09 ENCOUNTER — Telehealth: Payer: Self-pay | Admitting: *Deleted

## 2024-02-09 NOTE — Telephone Encounter (Signed)
 Spoke with [name] and relayed message from [name], NP that Dr. [name] has an earlier OR time and if patient would like to arrive by 1015-1030 for her surgery tomorrow morning instead of 1220? Pt agreed and would like to arrive by 1015-1030. Pt was glad to take the earlier time and thanked the office for calling.

## 2024-02-09 NOTE — Telephone Encounter (Signed)
 Telephone call to check on pre-operative status.  Patient compliant with pre-operative instructions.  Reinforced bowel prep instructions and Clear liquids until 1120 am on 10/22. Patient to arrive at 1220.  No questions or concerns voiced.  Instructed to call for any needs.

## 2024-02-09 NOTE — Progress Notes (Signed)
 1800 Called left a message asking if she could arrive at  Short Stay at 1000 instead of 1015-1030.  Explained there is a lot to do to get her ready for surgery and the additional 30 minute arrival would be very helpful.  Asked not to eat after midnight and she may have clear liquid up to 0930, 02-10-24 as instructed by PST.  Take medications as instructed by PST by 0930. 1820 Holly Brown returned the called and agreed to arrive at 1000 for her surgery.

## 2024-02-10 ENCOUNTER — Other Ambulatory Visit: Payer: Self-pay

## 2024-02-10 ENCOUNTER — Ambulatory Visit (HOSPITAL_COMMUNITY): Payer: Self-pay | Admitting: Physician Assistant

## 2024-02-10 ENCOUNTER — Encounter: Admitting: Physical Therapy

## 2024-02-10 ENCOUNTER — Ambulatory Visit (HOSPITAL_COMMUNITY): Payer: Self-pay | Admitting: Anesthesiology

## 2024-02-10 ENCOUNTER — Encounter (HOSPITAL_COMMUNITY): Payer: Self-pay | Admitting: Gynecologic Oncology

## 2024-02-10 ENCOUNTER — Inpatient Hospital Stay (HOSPITAL_COMMUNITY)
Admission: RE | Admit: 2024-02-10 | Discharge: 2024-02-14 | DRG: 741 | Disposition: A | Discharge status: Home / Self Care | Source: Ambulatory Visit | Attending: Gynecologic Oncology | Admitting: Gynecologic Oncology

## 2024-02-10 ENCOUNTER — Encounter (HOSPITAL_COMMUNITY)
Admission: RE | Disposition: A | Discharge status: Home / Self Care | Payer: Self-pay | Source: Ambulatory Visit | Attending: Gynecologic Oncology

## 2024-02-10 DIAGNOSIS — Z8541 Personal history of malignant neoplasm of cervix uteri: Secondary | ICD-10-CM

## 2024-02-10 DIAGNOSIS — C539 Malignant neoplasm of cervix uteri, unspecified: Secondary | ICD-10-CM | POA: Diagnosis not present

## 2024-02-10 DIAGNOSIS — F32A Depression, unspecified: Secondary | ICD-10-CM | POA: Diagnosis present

## 2024-02-10 DIAGNOSIS — Z90710 Acquired absence of both cervix and uterus: Secondary | ICD-10-CM

## 2024-02-10 DIAGNOSIS — N939 Abnormal uterine and vaginal bleeding, unspecified: Secondary | ICD-10-CM

## 2024-02-10 DIAGNOSIS — N891 Moderate vaginal dysplasia: Secondary | ICD-10-CM | POA: Diagnosis not present

## 2024-02-10 DIAGNOSIS — K219 Gastro-esophageal reflux disease without esophagitis: Secondary | ICD-10-CM | POA: Diagnosis present

## 2024-02-10 DIAGNOSIS — G8918 Other acute postprocedural pain: Secondary | ICD-10-CM

## 2024-02-10 DIAGNOSIS — G4730 Sleep apnea, unspecified: Secondary | ICD-10-CM | POA: Diagnosis present

## 2024-02-10 DIAGNOSIS — F17210 Nicotine dependence, cigarettes, uncomplicated: Secondary | ICD-10-CM | POA: Diagnosis present

## 2024-02-10 DIAGNOSIS — R142 Eructation: Secondary | ICD-10-CM

## 2024-02-10 DIAGNOSIS — Z87440 Personal history of urinary (tract) infections: Secondary | ICD-10-CM

## 2024-02-10 DIAGNOSIS — F419 Anxiety disorder, unspecified: Secondary | ICD-10-CM | POA: Diagnosis present

## 2024-02-10 DIAGNOSIS — Z8249 Family history of ischemic heart disease and other diseases of the circulatory system: Secondary | ICD-10-CM

## 2024-02-10 DIAGNOSIS — C52 Malignant neoplasm of vagina: Secondary | ICD-10-CM | POA: Diagnosis not present

## 2024-02-10 DIAGNOSIS — Z79899 Other long term (current) drug therapy: Secondary | ICD-10-CM

## 2024-02-10 DIAGNOSIS — R569 Unspecified convulsions: Secondary | ICD-10-CM | POA: Diagnosis not present

## 2024-02-10 DIAGNOSIS — C538 Malignant neoplasm of overlapping sites of cervix uteri: Principal | ICD-10-CM | POA: Diagnosis present

## 2024-02-10 DIAGNOSIS — I739 Peripheral vascular disease, unspecified: Secondary | ICD-10-CM | POA: Diagnosis present

## 2024-02-10 DIAGNOSIS — K760 Fatty (change of) liver, not elsewhere classified: Secondary | ICD-10-CM | POA: Diagnosis present

## 2024-02-10 DIAGNOSIS — K682 Retroperitoneal fibrosis: Secondary | ICD-10-CM | POA: Diagnosis not present

## 2024-02-10 DIAGNOSIS — Z87410 Personal history of cervical dysplasia: Secondary | ICD-10-CM

## 2024-02-10 DIAGNOSIS — E876 Hypokalemia: Secondary | ICD-10-CM

## 2024-02-10 DIAGNOSIS — F418 Other specified anxiety disorders: Secondary | ICD-10-CM

## 2024-02-10 DIAGNOSIS — R143 Flatulence: Secondary | ICD-10-CM

## 2024-02-10 DIAGNOSIS — Z9079 Acquired absence of other genital organ(s): Secondary | ICD-10-CM

## 2024-02-10 DIAGNOSIS — Z881 Allergy status to other antibiotic agents status: Secondary | ICD-10-CM

## 2024-02-10 DIAGNOSIS — R87629 Unspecified abnormal cytological findings in specimens from vagina: Secondary | ICD-10-CM

## 2024-02-10 HISTORY — PX: VAGINECTOMY, PARTIAL: SHX6846

## 2024-02-10 HISTORY — PX: CYSTOSCOPY WITH INDOCYANINE GREEN IMAGING (ICG): SHX7549

## 2024-02-10 LAB — TYPE AND SCREEN
ABO/RH(D): A POS
Antibody Screen: NEGATIVE

## 2024-02-10 SURGERY — VAGINECTOMY, PARTIAL
Anesthesia: General | Site: Ureter

## 2024-02-10 MED ORDER — ONDANSETRON HCL 4 MG/2ML IJ SOLN
4.0000 mg | Freq: Four times a day (QID) | INTRAMUSCULAR | Status: DC | PRN
  Administered 2024-02-11 – 2024-02-12 (×3): 4 mg via INTRAVENOUS
  Filled 2024-02-10 (×3): qty 2

## 2024-02-10 MED ORDER — OXYCODONE HCL 5 MG PO TABS: 5.0000 mg | ORAL_TABLET | Freq: Once | ORAL | Status: DC | PRN

## 2024-02-10 MED ORDER — STERILE WATER FOR IRRIGATION IR SOLN
Status: DC | PRN
  Administered 2024-02-10: 1000 mL

## 2024-02-10 MED ORDER — BUPIVACAINE HCL (PF) 0.25 % IJ SOLN
INTRAMUSCULAR | Status: AC
  Filled 2024-02-10: qty 30

## 2024-02-10 MED ORDER — ONDANSETRON HCL 4 MG PO TABS: 4.0000 mg | ORAL_TABLET | Freq: Four times a day (QID) | ORAL | Status: DC | PRN

## 2024-02-10 MED ORDER — SODIUM CHLORIDE 0.9 % IV SOLN
1.0000 g | INTRAVENOUS | Status: AC
  Administered 2024-02-10: 1 g via INTRAVENOUS
  Filled 2024-02-10: qty 1000

## 2024-02-10 MED ORDER — SUGAMMADEX SODIUM 200 MG/2ML IV SOLN
INTRAVENOUS | Status: DC | PRN
Start: 2024-02-10 — End: 2024-02-10
  Administered 2024-02-10: 200 mg via INTRAVENOUS

## 2024-02-10 MED ORDER — LACTATED RINGERS IR SOLN
Status: DC | PRN
  Administered 2024-02-10: 1000 mL

## 2024-02-10 MED ORDER — INDOCYANINE GREEN 25 MG IV SOLR
25.0000 mg | Freq: Once | INTRAVENOUS | Status: AC
  Administered 2024-02-10: 25 mg via TOPICAL
  Filled 2024-02-10: qty 10

## 2024-02-10 MED ORDER — BUPIVACAINE HCL 0.25 % IJ SOLN
INTRAMUSCULAR | Status: DC | PRN
  Administered 2024-02-10: 30 mL

## 2024-02-10 MED ORDER — PHENYLEPHRINE 80 MCG/ML (10ML) SYRINGE FOR IV PUSH (FOR BLOOD PRESSURE SUPPORT)
PREFILLED_SYRINGE | INTRAVENOUS | Status: DC | PRN
  Administered 2024-02-10: 80 ug via INTRAVENOUS

## 2024-02-10 MED ORDER — CHLORHEXIDINE GLUCONATE CLOTH 2 % EX PADS
6.0000 | MEDICATED_PAD | Freq: Every day | CUTANEOUS | Status: DC
  Administered 2024-02-11 – 2024-02-14 (×4): 6 via TOPICAL

## 2024-02-10 MED ORDER — SODIUM CHLORIDE 0.9 % IR SOLN
Status: DC | PRN
  Administered 2024-02-10: 3000 mL

## 2024-02-10 MED ORDER — AMISULPRIDE (ANTIEMETIC) 5 MG/2ML IV SOLN: 10.0000 mg | Freq: Once | INTRAVENOUS | Status: DC | PRN

## 2024-02-10 MED ORDER — SODIUM CHLORIDE 0.45 % IV SOLN: INTRAVENOUS | Status: AC

## 2024-02-10 MED ORDER — GABAPENTIN 100 MG PO CAPS
200.0000 mg | ORAL_CAPSULE | ORAL | Status: AC
  Administered 2024-02-10: 200 mg via ORAL
  Filled 2024-02-10: qty 2

## 2024-02-10 MED ORDER — ALVIMOPAN 12 MG PO CAPS
12.0000 mg | ORAL_CAPSULE | ORAL | Status: AC
  Administered 2024-02-10: 12 mg via ORAL
  Filled 2024-02-10: qty 1

## 2024-02-10 MED ORDER — HYDROMORPHONE HCL 1 MG/ML IJ SOLN
INTRAMUSCULAR | Status: AC
  Filled 2024-02-10: qty 2

## 2024-02-10 MED ORDER — HEPARIN SODIUM (PORCINE) 5000 UNIT/ML IJ SOLN
5000.0000 [IU] | INTRAMUSCULAR | Status: AC
  Administered 2024-02-10: 5000 [IU] via SUBCUTANEOUS
  Filled 2024-02-10: qty 1

## 2024-02-10 MED ORDER — LIDOCAINE HCL (CARDIAC) PF 100 MG/5ML IV SOSY
PREFILLED_SYRINGE | INTRAVENOUS | Status: DC | PRN
  Administered 2024-02-10: 100 mg via INTRAVENOUS

## 2024-02-10 MED ORDER — CELECOXIB 200 MG PO CAPS
200.0000 mg | ORAL_CAPSULE | ORAL | Status: DC
Start: 2024-02-10 — End: 2024-02-10
  Filled 2024-02-10: qty 1

## 2024-02-10 MED ORDER — CHLORHEXIDINE GLUCONATE 0.12 % MT SOLN
15.0000 mL | Freq: Once | OROMUCOSAL | Status: AC
  Administered 2024-02-10: 15 mL via OROMUCOSAL

## 2024-02-10 MED ORDER — BUPIVACAINE LIPOSOME 1.3 % IJ SUSP
20.0000 mL | Freq: Once | INTRAMUSCULAR | Status: DC
Start: 2024-02-10 — End: 2024-02-10

## 2024-02-10 MED ORDER — ROCURONIUM BROMIDE 100 MG/10ML IV SOLN
INTRAVENOUS | Status: DC | PRN
  Administered 2024-02-10: 20 mg via INTRAVENOUS
  Administered 2024-02-10: 50 mg via INTRAVENOUS
  Administered 2024-02-10: 10 mg via INTRAVENOUS
  Administered 2024-02-10: 20 mg via INTRAVENOUS

## 2024-02-10 MED ORDER — MIDAZOLAM HCL 5 MG/5ML IJ SOLN
INTRAMUSCULAR | Status: DC | PRN
  Administered 2024-02-10: 2 mg via INTRAVENOUS

## 2024-02-10 MED ORDER — PROPOFOL 10 MG/ML IV BOLUS
INTRAVENOUS | Status: DC | PRN
  Administered 2024-02-10: 50 mg via INTRAVENOUS
  Administered 2024-02-10: 150 mg via INTRAVENOUS

## 2024-02-10 MED ORDER — ENSURE PRE-SURGERY PO LIQD
592.0000 mL | Freq: Once | ORAL | Status: DC
  Filled 2024-02-10: qty 592

## 2024-02-10 MED ORDER — DEXMEDETOMIDINE HCL IN NACL 80 MCG/20ML IV SOLN
INTRAVENOUS | Status: DC | PRN
  Administered 2024-02-10: 8 ug via INTRAVENOUS
  Administered 2024-02-10: 4 ug via INTRAVENOUS
  Administered 2024-02-10: 8 ug via INTRAVENOUS

## 2024-02-10 MED ORDER — ONDANSETRON HCL 4 MG/2ML IJ SOLN
INTRAMUSCULAR | Status: DC | PRN
  Administered 2024-02-10: 4 mg via INTRAVENOUS

## 2024-02-10 MED ORDER — STERILE WATER FOR INJECTION IJ SOLN
INTRAMUSCULAR | Status: DC | PRN
  Administered 2024-02-10: 10 mL

## 2024-02-10 MED ORDER — MIDAZOLAM HCL 2 MG/2ML IJ SOLN
INTRAMUSCULAR | Status: AC
  Filled 2024-02-10: qty 2

## 2024-02-10 MED ORDER — SCOPOLAMINE 1 MG/3DAYS TD PT72
1.0000 | MEDICATED_PATCH | TRANSDERMAL | Status: DC
  Administered 2024-02-10: 1 mg via TRANSDERMAL
  Filled 2024-02-10: qty 1

## 2024-02-10 MED ORDER — BISACODYL 5 MG PO TBEC: 20.0000 mg | DELAYED_RELEASE_TABLET | Freq: Once | ORAL | Status: DC

## 2024-02-10 MED ORDER — METRONIDAZOLE 500 MG PO TABS: 1000.0000 mg | ORAL_TABLET | ORAL | Status: DC

## 2024-02-10 MED ORDER — TRAMADOL HCL 50 MG PO TABS
100.0000 mg | ORAL_TABLET | Freq: Four times a day (QID) | ORAL | Status: DC | PRN
  Administered 2024-02-11 – 2024-02-12 (×3): 100 mg via ORAL
  Filled 2024-02-10 (×5): qty 2

## 2024-02-10 MED ORDER — PHENYLEPHRINE 80 MCG/ML (10ML) SYRINGE FOR IV PUSH (FOR BLOOD PRESSURE SUPPORT)
PREFILLED_SYRINGE | INTRAVENOUS | Status: AC
  Filled 2024-02-10: qty 10

## 2024-02-10 MED ORDER — OXYCODONE HCL 5 MG PO TABS
5.0000 mg | ORAL_TABLET | ORAL | Status: DC | PRN
  Administered 2024-02-11 – 2024-02-13 (×6): 5 mg via ORAL
  Filled 2024-02-10 (×6): qty 1

## 2024-02-10 MED ORDER — ORAL CARE MOUTH RINSE: 15.0000 mL | Freq: Once | OROMUCOSAL | Status: AC

## 2024-02-10 MED ORDER — ACETAMINOPHEN 500 MG PO TABS
1000.0000 mg | ORAL_TABLET | ORAL | Status: DC
  Filled 2024-02-10: qty 2

## 2024-02-10 MED ORDER — LACTATED RINGERS IV SOLN: INTRAVENOUS | Status: DC

## 2024-02-10 MED ORDER — ACETAMINOPHEN 500 MG PO TABS
1000.0000 mg | ORAL_TABLET | Freq: Four times a day (QID) | ORAL | Status: DC
  Administered 2024-02-10 – 2024-02-14 (×14): 1000 mg via ORAL
  Filled 2024-02-10 (×16): qty 2

## 2024-02-10 MED ORDER — DEXAMETHASONE SOD PHOSPHATE PF 10 MG/ML IJ SOLN
4.0000 mg | INTRAMUSCULAR | Status: AC
  Administered 2024-02-10: 4 mg via INTRAVENOUS

## 2024-02-10 MED ORDER — PROPOFOL 10 MG/ML IV BOLUS
INTRAVENOUS | Status: AC
  Filled 2024-02-10: qty 20

## 2024-02-10 MED ORDER — ENOXAPARIN SODIUM 40 MG/0.4ML IJ SOSY
40.0000 mg | PREFILLED_SYRINGE | INTRAMUSCULAR | Status: DC
  Administered 2024-02-11 – 2024-02-14 (×4): 40 mg via SUBCUTANEOUS
  Filled 2024-02-10 (×4): qty 0.4

## 2024-02-10 MED ORDER — ACETAMINOPHEN 500 MG PO TABS: 1000.0000 mg | ORAL_TABLET | ORAL | Status: AC

## 2024-02-10 MED ORDER — FENTANYL CITRATE (PF) 100 MCG/2ML IJ SOLN
INTRAMUSCULAR | Status: AC
  Filled 2024-02-10: qty 2

## 2024-02-10 MED ORDER — HEMOSTATIC AGENTS (NO CHARGE) OPTIME
TOPICAL | Status: DC | PRN
  Administered 2024-02-10: 1 via TOPICAL

## 2024-02-10 MED ORDER — ENSURE PRE-SURGERY PO LIQD
296.0000 mL | Freq: Once | ORAL | Status: DC
  Filled 2024-02-10: qty 296

## 2024-02-10 MED ORDER — ROCURONIUM BROMIDE 10 MG/ML (PF) SYRINGE
PREFILLED_SYRINGE | INTRAVENOUS | Status: AC
  Filled 2024-02-10: qty 10

## 2024-02-10 MED ORDER — POLYETHYLENE GLYCOL 3350 17 GM/SCOOP PO POWD: 238.0000 g | Freq: Once | ORAL | Status: DC

## 2024-02-10 MED ORDER — HYDROMORPHONE HCL 1 MG/ML IJ SOLN
0.5000 mg | INTRAMUSCULAR | Status: DC | PRN
  Administered 2024-02-10 – 2024-02-12 (×7): 0.5 mg via INTRAVENOUS
  Filled 2024-02-10 (×6): qty 0.5

## 2024-02-10 MED ORDER — LIDOCAINE HCL (PF) 2 % IJ SOLN
INTRAMUSCULAR | Status: AC
  Filled 2024-02-10: qty 5

## 2024-02-10 MED ORDER — FENTANYL CITRATE (PF) 100 MCG/2ML IJ SOLN
INTRAMUSCULAR | Status: DC | PRN
  Administered 2024-02-10 (×6): 50 ug via INTRAVENOUS

## 2024-02-10 MED ORDER — SUGAMMADEX SODIUM 200 MG/2ML IV SOLN
INTRAVENOUS | Status: AC
  Filled 2024-02-10: qty 2

## 2024-02-10 MED ORDER — NEOMYCIN SULFATE 500 MG PO TABS
1000.0000 mg | ORAL_TABLET | ORAL | Status: DC
Start: 2024-02-10 — End: 2024-02-10

## 2024-02-10 MED ORDER — OXYCODONE HCL 5 MG/5ML PO SOLN: 5.0000 mg | Freq: Once | ORAL | Status: DC | PRN

## 2024-02-10 MED ORDER — ONDANSETRON HCL 4 MG/2ML IJ SOLN
INTRAMUSCULAR | Status: AC
  Filled 2024-02-10: qty 2

## 2024-02-10 MED ORDER — HYDROMORPHONE HCL 1 MG/ML IJ SOLN
0.2500 mg | INTRAMUSCULAR | Status: DC | PRN
  Administered 2024-02-10 (×2): 0.5 mg via INTRAVENOUS

## 2024-02-10 MED ORDER — SENNOSIDES-DOCUSATE SODIUM 8.6-50 MG PO TABS
2.0000 | ORAL_TABLET | Freq: Every day | ORAL | Status: DC
  Administered 2024-02-10 – 2024-02-13 (×3): 2 via ORAL
  Filled 2024-02-10 (×4): qty 2

## 2024-02-10 SURGICAL SUPPLY — 83 items
APPLICATOR ARISTA FLEXITIP XL (MISCELLANEOUS) IMPLANT
BAG COUNTER SPONGE SURGICOUNT (BAG) IMPLANT
BAG URO CATCHER STRL LF (MISCELLANEOUS) ×3 IMPLANT
BNDG ADH 1X3 SHEER STRL LF (GAUZE/BANDAGES/DRESSINGS) ×3 IMPLANT
CABLE HIGH FREQUENCY MONO STRZ (ELECTRODE) IMPLANT
CATH URETL OPEN 5X70 (CATHETERS) IMPLANT
CHLORAPREP W/TINT 26 (MISCELLANEOUS) ×3 IMPLANT
CLIP APPLIE ROT 10 11.4 M/L (STAPLE) IMPLANT
CLOTH BEACON ORANGE TIMEOUT ST (SAFETY) ×3 IMPLANT
CNTNR URN SCR LID CUP LEK RST (MISCELLANEOUS) IMPLANT
COVER BACK TABLE 60X90IN (DRAPES) IMPLANT
COVER SURGICAL LIGHT HANDLE (MISCELLANEOUS) ×3 IMPLANT
COVER TIP SHEARS 8 DVNC (MISCELLANEOUS) IMPLANT
DERMABOND ADVANCED .7 DNX12 (GAUZE/BANDAGES/DRESSINGS) ×3 IMPLANT
DRAPE ARM DVNC X/XI (DISPOSABLE) IMPLANT
DRAPE COLUMN DVNC XI (DISPOSABLE) IMPLANT
DRAPE SHEET LG 3/4 BI-LAMINATE (DRAPES) IMPLANT
DRAPE SURG IRRIG POUCH 19X23 (DRAPES) IMPLANT
DRAPE UTILITY XL STRL (DRAPES) IMPLANT
DRIVER NDL MEGA SUTCUT DVNCXI (INSTRUMENTS) IMPLANT
DRIVER NDLE MEGA SUTCUT DVNCXI (INSTRUMENTS) IMPLANT
ELECT PENCIL ROCKER SW 15FT (MISCELLANEOUS) IMPLANT
ELECT REM PT RETURN 15FT ADLT (MISCELLANEOUS) ×3 IMPLANT
FORCEPS BPLR FENES DVNC XI (FORCEP) IMPLANT
FORCEPS PROGRASP DVNC XI (FORCEP) IMPLANT
GAUZE 4X4 16PLY ~~LOC~~+RFID DBL (SPONGE) IMPLANT
GLOVE BIO SURGEON STRL SZ 6 (GLOVE) ×6 IMPLANT
GLOVE BIO SURGEON STRL SZ 6.5 (GLOVE) ×3 IMPLANT
GLOVE SURG LX STRL 7.5 STRW (GLOVE) ×3 IMPLANT
GOWN STRL REUS W/ TWL LRG LVL3 (GOWN DISPOSABLE) ×12 IMPLANT
GOWN STRL REUS W/ TWL XL LVL3 (GOWN DISPOSABLE) ×3 IMPLANT
GRASPER SUT TROCAR 14GX15 (MISCELLANEOUS) IMPLANT
GUIDEWIRE ANG ZIPWIRE 038X150 (WIRE) IMPLANT
GUIDEWIRE STR DUAL SENSOR (WIRE) IMPLANT
HANDLE SUCTION POOLE (INSTRUMENTS) ×3 IMPLANT
HEMOSTAT ARISTA ABSORB 3G PWDR (HEMOSTASIS) IMPLANT
HEMOSTAT SURGICEL 4X8 (HEMOSTASIS) ×3 IMPLANT
HOLDER FOLEY CATH W/STRAP (MISCELLANEOUS) IMPLANT
IRRIGATION SUCT STRKRFLW 2 WTP (MISCELLANEOUS) IMPLANT
KIT BASIN OR (CUSTOM PROCEDURE TRAY) ×3 IMPLANT
KIT PROCEDURE DVNC SI (MISCELLANEOUS) ×3 IMPLANT
KIT TURNOVER KIT A (KITS) ×6 IMPLANT
MANIFOLD NEPTUNE II (INSTRUMENTS) ×3 IMPLANT
MANIPULATOR UTERINE 4.5 ZUMI (MISCELLANEOUS) IMPLANT
NDL HYPO 21X1.5 SAFETY (NEEDLE) IMPLANT
NEEDLE HYPO 21X1.5 SAFETY (NEEDLE) IMPLANT
OBTURATOR OPTICALSTD 8 DVNC (TROCAR) IMPLANT
PACK CYSTO (CUSTOM PROCEDURE TRAY) ×3 IMPLANT
PACK ROBOT GYN CUSTOM WL (TRAY / TRAY PROCEDURE) IMPLANT
PAD POSITIONING PINK XL (MISCELLANEOUS) IMPLANT
PORT ACCESS TROCAR AIRSEAL 12 (TROCAR) IMPLANT
RETRACTOR WND ALEXIS 18 MED (MISCELLANEOUS) ×3 IMPLANT
SCISSORS LAP 5X35 DISP (ENDOMECHANICALS) IMPLANT
SCISSORS MNPLR CVD DVNC XI (INSTRUMENTS) IMPLANT
SCRUB CHG 4% DYNA-HEX 4OZ (MISCELLANEOUS) ×3 IMPLANT
SEAL UNIV 5-12 XI (MISCELLANEOUS) IMPLANT
SET TRI-LUMEN FLTR TB AIRSEAL (TUBING) IMPLANT
SET TUBE SMOKE EVAC HIGH FLOW (TUBING) ×3 IMPLANT
SHEET LAVH (DRAPES) ×3 IMPLANT
SUT MNCRL AB 4-0 PS2 18 (SUTURE) IMPLANT
SUT SILK 2 0 SH (SUTURE) IMPLANT
SUT STRATA PDS 0 30 CT-2.5 (SUTURE) IMPLANT
SUT V-LOC 180 0-0 GS22 (SUTURE) IMPLANT
SUT VIC AB 0 CT1 27XBRD ANTBC (SUTURE) IMPLANT
SUT VIC AB 2-0 CT2 27 (SUTURE) IMPLANT
SUT VIC AB 2-0 SH 27X BRD (SUTURE) IMPLANT
SUT VIC AB 3-0 PS2 18XBRD (SUTURE) IMPLANT
SUT VIC AB 3-0 SH 27X BRD (SUTURE) IMPLANT
SUT VIC AB 4-0 PS2 18 (SUTURE) IMPLANT
SUT VICRYL 0 27 CT2 27 ABS (SUTURE) IMPLANT
SUTURE STRAT PDS 2-0 15 CT-2.5 (SUTURE) IMPLANT
SYR 10ML LL (SYRINGE) IMPLANT
SYSTEM BAG RETRIEVAL 10MM (BASKET) IMPLANT
TOWEL OR 17X26 10 PK STRL BLUE (TOWEL DISPOSABLE) ×3 IMPLANT
TRAY FOLEY MTR SLVR 16FR STAT (SET/KITS/TRAYS/PACK) ×3 IMPLANT
TRAY LAPAROSCOPIC (CUSTOM PROCEDURE TRAY) ×3 IMPLANT
TROCAR ADV FIXATION 12X100MM (TROCAR) ×3 IMPLANT
TROCAR BALLN 12MMX100 BLUNT (TROCAR) IMPLANT
TROCAR XCEL NON-BLD 5MMX100MML (ENDOMECHANICALS) IMPLANT
TUBING CONNECTING 10 (TUBING) ×3 IMPLANT
TUBING UROLOGY SET (TUBING) IMPLANT
UNDERPAD 30X36 HEAVY ABSORB (UNDERPADS AND DIAPERS) IMPLANT
YANKAUER SUCT BULB TIP NO VENT (SUCTIONS) ×3 IMPLANT

## 2024-02-10 NOTE — Op Note (Signed)
 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 Cam)  Surgeon: [name] Crank MD  Assistant Surgeon: [name], NP (an NP assistant was necessary for tissue manipulation, management of robotic instrumentation, retraction and positioning due to the complexity of the case and hospital policies).   Anesthesia: GET  Urine Output: 250 cc  Operative Findings: On EUA, firm 2 cm area of tissue at the left aspect of the vaginal cuff.  No distinct parametrial involvement.  Rectum feels somewhat tethered.  On intra-abdominal entry, normal upper abdominal survey.  Normal small and large bowel.  Aspect of left omentum adherent to the pelvic brim on the left.  Normal-appearing bilateral ovaries with possible fallopian tube remnant versus round ligament remnant attached distally.  Fullness appreciated at the left vaginal cuff.  With manipulation of the vagina using any sizer, the left lateral parametria appeared somewhat full but unclear if tumor present.  This area was ultimately found to be separate from and not causing compression of her left ureter. Given some retroperitoneal fibrosis and desire to achieve negative margin, left ureterolysis performed down to the level of the bladder to mobilize the left parametrium and paravaginal tissue. Prior to colpotomy, a 2-0 STRATAFIX and a 2-0 V-Loc were used to create a cerclage of sorts circumferentially distal to the vaginal cuff lesion ensuring at least a 1 cm margin circumferentially.  Colpotomy was then made inferior to this cerclage using a KOH ring within the vagina as a guide.  Once the vaginal apex  was removed from the vagina, cerclage was removed.  Closest margin was the left lateral margin at 1 cm.  The posterior margin was 2 cm and anteriorly was 1.5 cm.  Laterally to the right there was significant margin of normal tissue.  Bubble est performed at the end of the surgery was negative for evidence of leak.  Ovaries were pexed out of the pelvis in the event that patient requires pelvic radiation.  Radiopaque clips were used to define the distal margin of each ovary.  Estimated Blood Loss:  200 cc      Total IV Fluids: see I&O flowsheet         Specimens: Upper vagina (tumor along the left aspect), left round ligament remnant         Complications:  None apparent; patient tolerated the procedure well.         Disposition: PACU - hemodynamically stable.  Procedure Details  The patient was seen in the Holding Room. The risks, benefits, complications, treatment options, and expected outcomes were discussed with the patient.  The patient concurred with the proposed plan, giving informed consent.  The site of surgery properly noted/marked. The patient was identified as Verla Slocumb and the procedure verified as a Robotic-assisted for vaginectomy and any other indicated procedures.   After induction of anesthesia, the patient was draped and prepped in the usual sterile manner. Patient was placed in supine position after anesthesia and draped and prepped in the usual sterile manner as follows: Her arms were tucked to her side with all appropriate precautions.  The shoulders were stabilized with padded shoulder blocks applied to the acromium processes.  The patient was placed in the semi-lithotomy position in Grant stirrups.  The perineum and vagina were prepped  with CHG. The patient's abdomen was prepped with ChloraPrep and then she was draped after the prep had been allowed to dry for 3 minutes.  A Time Out was held and the above information confirmed.  Dr. Cam first began with cystoscopy  and ICG injection bilaterally into each ureter.  Left ureteral stent was left.  He then placed a Foley catheter.  Next, a 10 mm skin incision was made 1 cm below the subcostal margin in the midclavicular line.  The 5 mm Optiview port and scope was used for direct entry.  Opening pressure was under 10 mm CO2.  The abdomen was insufflated and the findings were noted as above.   At this point and all points during the procedure, the patient's intra-abdominal pressure did not exceed 15 mmHg. Next, an 8 mm skin incision was made superior to the umbilicus and a right and left port were placed about 8 cm lateral to the robot port on the right and left side.  A fourth arm was placed on the right.  The 5 mm assist trocar was exchanged for a 10-12 mm port. All ports were placed under direct visualization.  The patient was placed in steep Trendelenburg.  Bowel was folded away into the upper abdomen.  The robot was docked in the normal manner.  Monopolar electrocautery was used to free the adhesion of the omentum to the left pelvic brim.  Attention was then turned to the left pelvic sidewall.  The left ovary was freed distally from its attachments to the peritoneum and to the remnant left round ligament.  The remnant left round ligament was excised using monopolar electrocautery and handed off the field.  Once the left ovary had been mobilized and the infundibulopelvic ligament was skeletonized, the retroperitoneum was opened along the left pelvic sidewall.  Retroperitoneal space was developed including the pararectal and paravesical spaces.  The left ureter was identified and confirmed with infrared given ICG injection.  Ureterolysis was then used to traced the course of the ureter distal to the remnant uterine artery.  Care was taken not to develop and skeletonized the ureter 360 but rather to unroofed adventitial tissue anteriorly following the course of the ureter in the event that patient needs pelvic radiation and  to hopefully decrease the risk related to stricture and devascularization.  Once the ureter had been sufficiently mobilized and passed visualized, the uterine artery was cauterized and transected above the ureter and mobilized medially taking some of the parametrial tissue with it.  Patient was turned to the right.  The right ovary was similarly mobilized from its attachment to the peritoneum.  The infundibulopelvic ligament was skeletonized to allow for ultimate ovarian transposition in the event that patient requires future radiation.  The right retroperitoneum was opened and the paravesical and pararectal spaces developed.    An EEA sizer was then placed in the vagina and 1 in the rectum to help delineate the vaginal cuff and rectum.  Monopolar electrocautery was used to cauterize over the vaginal cuff and developed the bladder plane.  Adhesions made this somewhat difficult but ultimately the pelvic Endo fascia was identified and the bladder flap developed.  Anteriorly on the left, the ureter was dissected along its course into the insertion into the bladder and ultimately the bladder pillars on this side were cauterized and transected to help mobilize the bladder off of the upper vagina.  With the EEA sizer angled anterior, the rectovaginal space was developed.  A combination of short  bursts of monopolar electrocautery and sharp dissection were then used to mobilize the rectum well below the level of the planned upper vaginectomy.  Attention was then turned to the vagina.  2 sutures using 2-0 stratafix and V-Loc used to place a cerclage below the left vaginal apex lesion along the vagina, in essence hiding the lesion from view to help prevent intra-abdominal exposure at the time of colpotomy.  This was done in an effort to ensure at least a 1 cm margin around the entire lesion.  Patient was turned back to the abdomen.  The left parametrial and left paravaginal tissue was further mobilized and the  vaginal tube skeletonized.  The left uterosacral ligament was transected approximately 1-2 cm from its insertion.  A KOH ring was gently placed within the vagina to help identify the vaginal cuff distal to this cerclage.  A colpotomy was then made circumferentially and the upper vagina and left parametrium were delivered through the vagina.  Specimen was then examined on the back table.  The cerclage was removed.  Margins were measured and noted above in the findings.  The vaginal cuff was then closed with a figure-of-eight using 0 Vicryl at each apex and running 0 STRATAFIX to close the midportion of the cuff in 2 layers in a running manner.  It was difficult to determine if there was some very minimal serosal disruption on the anterior surface of the rectum just below the peritoneal reflection. This was oversewn with multiple interrupted stitches using 2-0 Vicryl suture. Given the degree of dissection and some adhesive disease of the rectum, a bubble test was performed.  1 robotic instrument was removed and the patient was flattened some.  The pelvis was filled with irrigation and air was instilled into the rectum with no bubbles detected.  Fluid was then removed and the patient placed back in steep Trendelenburg.  Irrigation was used and excellent hemostasis was achieved.  Given raw surfaces within the deep pelvis, Arista was placed.  Excellent hemostasis still ensured.    Attention was then turned to the mid abdomen for ovarian transposition.  Each ovary was secured to the abdominal sidewall using 2 figure-of-eight's of 2-0 Prolene.  Radiopaque clips were placed to define the distal margin of the ovary bilaterally.  At this point in the procedure was completed.  Robotic instruments were removed under direct visulaization.  The robot was undocked. The fascia at the 12 mm port was closed with 0 Vicryl using a PMI fascial closure device under direct visualization.  The subcuticular tissue was closed with  4-0 Vicryl and the skin was closed with 4-0 Monocryl in a subcuticular manner.  Dermabond was applied.    The vagina was swabbed with minimal bleeding noted.  Area at the cuff along the midline with slight oozing was oversewn with a figure-of-eight using 2-0 Vicryl with excellent hemostasis.  Foley catheter was left in situ.  All sponge, lap and needle counts were correct x  3.   The patient was transferred to the recovery room in stable condition.  Comer Dollar, MD

## 2024-02-10 NOTE — Consult Note (Signed)
 WOC Nurse requested for preoperative stoma site marking  Discussed surgical procedure and possible stoma creation with patient and sister.  Explained role of the WOC nurse team.  Provided the patient with educational booklet and provided samples of pouching options.  Answered patient and sister's questions.   Examined patient lying and sitting upright, in order to place the marking in the patient's visual field, away from any creases or abdominal contour issues and within the rectus muscle. Attempted to mark below the patient's belt line. A significant crease occurs lower on the abd when the patient leans forward which should be avoided if possible.   Marked for colostomy in the LLQ  _6___ cm to the left of the umbilicus and _3___cm below the umbilicus.  Marked for ileostomy in the RLQ  __6__cm to the right of the umbilicus and  __3__ cm below the umbilicus.  Patient's abdomen cleansed with CHG wipes at site markings, allowed to air dry prior to marking. Pt plans for surgery within the next half hour.   WOC Nurse team will follow up with patient after surgery for continued ostomy care and teaching if she receives an ostomy.   Thank-you,  Stephane Fought MSN, RN, CWOCN, CWCN-AP, CNS Contact Mon-Fri 0700-1500: [phone]

## 2024-02-10 NOTE — Op Note (Signed)
 Preoperative diagnosis:  Recurrent cervical cancer Postoperative diagnosis:  Same   Procedure: Cystoscopy Instillation of ureteral firefly constrast Left distal ureteroscopy   Surgeon: Morene MICAEL Salines, MD   Anesthesia: General   Complications: None   Intraoperative findings:  #1: The left distal intramural ureter was narrowed and very tight and causing a false passage of the open-ended catheter.  As a result, I had to perform distal ureteroscopy in order to find the true lumen.  Once in the true lumen, the 5 [place] open-ended catheter did slide up with with minimal resistance once past the distal ureter, I left the open-ended stent within the ureter at the end of the case. 2.:  The right ureter was widely patent and easily cannulized. #3: 7.5 mL of firefly contrast (injected into each ureter.  EBL: Minimal   Specimens: None   Indication:  Holly Brown  is a 38 y.o.  patient with recurrent cervical/vaginal cancer Dr. [name] requested cystoscopy and instillation of firefly contrast to help facilitate the dissection of the sigmoid colon.  After reviewing the management options for treatment, he elected to proceed with the above surgical procedure(s). We have discussed the potential benefits and risks of the procedure, side effects of the proposed treatment, the likelihood of the patient achieving the goals of the procedure, and any potential problems that might occur during the procedure or recuperation. Informed consent has been obtained.   Description of procedure:   The patient was taken to the operating room and general anesthesia was induced.  The patient was placed in the dorsal lithotomy position, prepped and draped in the usual sterile fashion, and preoperative antibiotics were administered. A preoperative time-out was performed.    A 21 French 30 degree cystoscope was gently passed through the patient's urethra into the bladder.  The bladder was subsequently emptied and  then filled slowly up performing a 360 degrees cystoscopic evaluation.  This demonstrated orthotopic ureteral orifices, normal bladder mucosa  without abnormality.   I advanced a 5 [place] open-ended catheter into the patient's left ureter and met resistance instantly, I slowly put pressure on it, and was unable to get it beyond the 2 cm length.  I tried pulling back and advanced a wire through the open-ended catheter and again met significant resistance.  I then used the single-lumen semirigid ureteroscope and was able to find the true lumen.  Once I was in the true lumen, it remained somewhat stenosed/tight or compressed, but I was able to get past the distal ureter.  I then advanced a wire through the scope and removed the scope over the wire.  I subsequently advanced the open-ended catheter over the wire removing the wire.  I inject 7.5 cc of firefly contrast into the open-ended catheter into the right collecting system.  Subsequently turned my attention to the patient's right ureteral orifice and performed a similar task by inserting an open-ended catheter using a sensor wire as a guide.  Once the catheter was all the way up into the patient's proximal ureter I remove the wire and injected 7.5 cc of firefly contrast while slowly pulling back on the open-ended catheter.   I then  placed a 16 [place] Foley.    The surgery was then turned over to Dr. [name] for facilitation of the remainder of the case.

## 2024-02-10 NOTE — Brief Op Note (Incomplete Revision)
 02/10/2024  4:35 PM  PATIENT:  Holly Brown  38 y.o. 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 History of cervical cancer.  POST-OPERATIVE DIAGNOSIS:  Squamous cell carcinoma, HX CERVICAL CANCER  PROCEDURE:  Procedure(s) with comments: UPPER VAGINECTOMY, LEFT PARAMETRECTOMY, LEFT URETEROLYSIS (N/A) - ROBOTIC  ASSISTED UPPER VAGINECTOMY CYSTOSCOPY WITH INDOCYANINE GREEN IMAGING (ICG) (Bilateral) Ovarian transposition  SURGEON:  Surgeons and Role: Panel 1:    DEWAINE [name] Comer JONELLE, MD - Primary    * Rogelio Planas, MD - Assisting Panel 2:    * Cam Morene ORN, MD - Primary  ANESTHESIA:   general  EBL:  200 mL   BLOOD ADMINISTERED:none  DRAINS: none   LOCAL MEDICATIONS USED:  marcaine   SPECIMEN:  left remnant round ligament, upper vaginectomy  DISPOSITION OF SPECIMEN:  PATHOLOGY  COUNTS:  YES  TOURNIQUET:  * No tourniquets in log *  DICTATION: .Note written in EPIC  PLAN OF CARE: Admit for overnight observation  PATIENT DISPOSITION:  PACU - hemodynamically stable.   Delay start of Pharmacological VTE agent (>24hrs) due to surgical blood loss or risk of bleeding: no

## 2024-02-10 NOTE — Transfer of Care (Signed)
 Immediate Anesthesia Transfer of Care Note  Patient: Holly Brown  Procedure(s) Performed: UPPER VAGINECTOMY, LEFT PARAMETRECTOMY, LEFT URETEROLYSIS, OVARIAN TRANSPOSITION (Abdomen) CYSTOSCOPY WITH INDOCYANINE GREEN IMAGING (ICG) (Bilateral: Ureter)  Patient Location: PACU  Anesthesia Type:General  Level of Consciousness: alert , oriented, and drowsy  Airway & Oxygen Therapy: Patient Spontanous Breathing and Patient connected to face mask oxygen  Post-op Assessment: Report given to RN and Post -op Vital signs reviewed and stable  Post vital signs: Reviewed and stable  Last Vitals:  Vitals Value Taken Time  BP 132/73 02/10/24 16:45  Temp    Pulse 95 02/10/24 16:48  Resp 14 02/10/24 16:48  SpO2 100 % 02/10/24 16:48  Vitals shown include unfiled device data.  Last Pain:  Vitals:   02/10/24 1037  TempSrc:   PainSc: 0-No pain      Patients Stated Pain Goal: 4 (02/10/24 1037)  Complications: No notable events documented.

## 2024-02-10 NOTE — Anesthesia Postprocedure Evaluation (Signed)
 Anesthesia Post Note  Patient: Holly Brown  Procedure(s) Performed: UPPER VAGINECTOMY, LEFT PARAMETRECTOMY, LEFT URETEROLYSIS, OVARIAN TRANSPOSITION (Abdomen) CYSTOSCOPY WITH INDOCYANINE GREEN IMAGING (ICG) (Bilateral: Ureter)     Patient location during evaluation: PACU Anesthesia Type: General Level of consciousness: awake Pain management: pain level controlled Vital Signs Assessment: post-procedure vital signs reviewed and stable Respiratory status: spontaneous breathing, nonlabored ventilation and respiratory function stable Cardiovascular status: blood pressure returned to baseline and stable Postop Assessment: no apparent nausea or vomiting Anesthetic complications: no   No notable events documented.  Last Vitals:  Vitals:   02/10/24 1715 02/10/24 1730  BP: 130/70 136/85  Pulse: 71 66  Resp: 11 12  Temp:    SpO2: 100% 100%    Last Pain:  Vitals:   02/10/24 1730  TempSrc:   PainSc: 4                  Delon Aisha Arch

## 2024-02-10 NOTE — Interval H&P Note (Signed)
 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 History of cervical cancer..  The various methods of treatment have been discussed with the patient and family. After consideration of risks, benefits and other options for treatment, the patient has consented to  Procedure(s) with comments: VAGINECTOMY, PARTIAL (N/A) - ROBOTIC  ASSISTED UPPER VAGINECTOMY POSSIBLE BOWEL SURGERY CYSTOSCOPY WITH INDOCYANINE GREEN IMAGING (ICG) (Bilateral) as a surgical intervention.  The patient's history has been reviewed, patient examined, no change in status, stable for surgery.  I have reviewed the patient's chart and labs.  Questions were answered to the patient's satisfaction.     Comer JONELLE Dollar

## 2024-02-10 NOTE — Discharge Instructions (Addendum)
 AFTER SURGERY INSTRUCTIONS   Return to work: 4-6 weeks if applicable   Activity: 1. Be up and out of the bed during the day.  Take a nap if needed.  You may walk up steps but be careful and use the hand rail.  Stair climbing will tire you more than you think, you may need to stop part way and rest.    2. No lifting or straining for 6 weeks over 10 pounds. No pushing, pulling, straining for 6 weeks.   3. No driving for 4-89 days when the following criteria have been met: Do not drive if you are taking narcotic pain medicine and make sure that your reaction time has returned.    4. You can shower as soon as the next day after surgery. Shower daily.  Use your regular soap and water (not directly on the incision) and pat your incision(s) dry afterwards; don't rub.  No tub baths or submerging your body in water until cleared by your surgeon. If you have the soap that was given to you by pre-surgical testing that was used before surgery, you do not need to use it afterwards because this can irritate your incisions.    5. No sexual activity and nothing in the vagina for 12 weeks.   6. You may experience a small amount of clear drainage from your incisions, which is normal.  If the drainage persists, increases, or changes color please call the office.   7. Do not use creams, lotions, or ointments such as neosporin on your incisions after surgery until advised by your surgeon because they can cause removal of the dermabond glue on your incisions.     8. You may experience vaginal spotting after surgery or when the stitches at the top of the vagina begin to dissolve.  The spotting is normal but if you experience heavy bleeding, call our office.   9. Take Tylenol  or ibuprofen  first for pain if you are able to take these medications and only use narcotic pain medication for severe pain not relieved by the Tylenol  or Ibuprofen  (you can take the oxycodone  5 or 10mg  every 4 hours as needed for severe pain).   Monitor your Tylenol  intake to a max of 4,000 mg in a 24 hour period. You can alternate these medications after surgery.   Diet: 1. Low sodium Heart Healthy Diet is recommended but you are cleared to resume your normal (before surgery) diet after your procedure.   2.  You have been prescribed Sennakot-S to take at bedtime every evening after surgery to keep bowel movements regular and to prevent constipation.  You can also take miralax  one to two times daily as needed given that this seemed to help with your bowel function. Once you start having bowel movements, goal is for a soft bowel movement every day or at least every other day.   Wound Care: 1. Keep clean and dry.  Shower daily.   Reasons to call the Doctor: Fever - Oral temperature greater than 100.4 degrees Fahrenheit Foul-smelling vaginal discharge Difficulty urinating Nausea and vomiting Increased pain at the site of the incision that is unrelieved with pain medicine. Difficulty breathing with or without chest pain New calf pain especially if only on one side Sudden, continuing increased vaginal bleeding with or without clots.   Contacts: For questions or concerns you should contact:   Dr. Comer Dollar at [phone]   [name], NP at [phone]   After Hours: call [phone] and have  the GYN Oncologist paged/contacted (after 5 pm or on the weekends). You will speak with an after hours RN and let he or she know you have had surgery.   Messages sent via mychart are for non-urgent matters and are not responded to after hours so for urgent needs, please call the after hours number.

## 2024-02-10 NOTE — Consult Note (Signed)
 Holly Brown  1985/06/01 [id number]  CARE TEAM:  PCP: Leonarda [name], NP  Outpatient Care Team: Patient Care Team: [name], [name], NP as PCP - General (Family Medicine) [name] Comer SAUNDERS, MD as Consulting Physician (Gynecologic Oncology)  Inpatient Treatment Team: Treatment Team:  [name] Comer SAUNDERS, MD Sheldon Standing, MD   This patient is a 38 y.o.female who presents today for surgical evaluation at the request of Dr. [name]..   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  Persistent issues.  Underwent transvaginal hysterectomy with a unilateral salpingectomy.  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  Had follow-up bilateral pelvic lymphadenectomy.  Managed by Dr. [name] atypical vaginal cuff biopsy with repeat biopsy showing at least squamous cell carcinoma in situ with possible carcinoma.  CAT scan showing some colon inflammation/thickening.  Colorectal consultation requested in case en bloc rectosigmoid resection needed.   Assessment  Holly Brown  38 y.o. female  * Day of Surgery *  Procedure(s): VAGINECTOMY, PARTIAL CYSTOSCOPY WITH INDOCYANINE GREEN IMAGING (ICG)  Problem List:  Active Problems:   * No active hospital problems. 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  Possible need for bladder repair, bowel resection or colorectal resection.  Possible need of ostomy discussed.  I will be available to help should more extensive resection be required.  Discussion with patient at bedside.  Questions answered.  She agrees to proceed. The anatomy & physiology of the digestive tract was discussed.  The pathophysiology of the pathology was discussed.  Natural history risks  without surgery was discussed.   I worked to give an overview of the disease and the frequent need to have multispecialty involvement.  I feel the risks of no intervention will lead to serious problems that outweigh the operative risks; therefore, I agree with proceeding with vaginectomy and possible en bloc pelvic floor organ resection.  Possible need for temporary diverting loop ileostomy or end colostomy discussed.  Will do preoperative marking.  Minimally Invasive (Robotic/Laparoscopic) & open techniques were discussed.  We will work to preserve anal & pelvic floor function without sacrificing cure.  Risks such as bleeding, infection, abscess, leak, reoperation, possible temporary or permanent ostomy, hernia, stroke, heart attack, death, and other risks were discussed.  I noted a good likelihood this will help address the problem.   Goals of post-operative recovery were discussed as well.  We will work to minimize complications.  Educational information was available as well.  Questions were answered.  The patient expresses understanding & wishes to proceed with surgery.  I reviewed nursing notes, Consultant Gyn Onc. notes, last 24 h vitals and pain scores, last 48 h intake and output, last 24 h labs and trends, and last 24 h imaging results. I have reviewed this patient's available data, including medical history, events of note, test results, etc as part of my evaluation.  A significant portion of that time was spent in counseling.  Care during the described time interval was provided by me.  This care required straight-forward level of medical decision making.  02/10/2024  Standing KYM Sheldon, MD, FACS, MASCRS Esophageal, Gastrointestinal & Colorectal Surgery Robotic and Minimally Invasive Surgery  [facility] [address] [place], [place] [address] [phone] Fax [phone] Main  CONTACT INFORMATION: Weekday (9AM-5PM):  Call [facility] main office at [phone] Weeknight (5PM-9AM) or Weekend/Holiday: Check EPIC Web Links tab & use AMION (password  [email or web address]) for General Surgery [facility] coverage  Please, DO NOT use SecureChat  (it is not reliable communication to reach operating surgeons & will lead to a delay in care).   Epic staff messaging available for outpatient concerns needing 1-2 business day response.      02/10/2024      Past Medical History:  Diagnosis Date   Anxiety    Cancer (HCC)    cervical cancer   Depression    Fatty liver    GERD (gastroesophageal reflux disease)    History of hiatal hernia    History of UTI    HSV (herpes simplex virus) infection    Migraines    Peripheral vascular disease    Post-operative nausea and vomiting 12/19/2022   Seizures (HCC)    Jan.2016 - only had one seizure, none since   Sleep apnea    no CPAP   Vaginal Pap smear, abnormal     Past Surgical History:  Procedure Laterality Date   BIOPSY  12/24/2022   Procedure: BIOPSY;  Surgeon: Leigh Elspeth SQUIBB, MD;  Location: [facility] ENDOSCOPY;  Service: Gastroenterology;;   BONE BIOPSY  12/12/2023   Procedure: BIOPSY, GI;  Surgeon: Federico Rosario BROCKS, MD;  Location: [facility] ENDOSCOPY;  Service: Gastroenterology;;   COLONOSCOPY N/A 12/12/2023   Procedure: COLONOSCOPY;  Surgeon: Federico Rosario BROCKS, MD;  Location: [facility] ENDOSCOPY;  Service: Gastroenterology;  Laterality: N/A;   DILATION AND CURETTAGE OF UTERUS     ESOPHAGOGASTRODUODENOSCOPY (EGD) WITH PROPOFOL  N/A 12/24/2022   Procedure: ESOPHAGOGASTRODUODENOSCOPY (EGD) WITH PROPOFOL ;  Surgeon: Leigh Elspeth SQUIBB, MD;  Location: [facility] ENDOSCOPY;  Service: Gastroenterology;  Laterality: N/A;   LAPAROSCOPIC TUBAL LIGATION Bilateral 12/08/2018   Procedure: LAPAROSCOPIC TUBAL LIGATION;  Surgeon: Alger Gong, MD;  Location: [facility];  Service: Gynecology;  Laterality: Bilateral;   LEEP N/A 03/18/2022   Procedure: LOOP ELECTROSURGICAL EXCISION PROCEDURE (LEEP);   Surgeon: Alger Gong, MD;  Location: [facility] OR;  Service: Gynecology;  Laterality: N/A;   MINI-LAPAROTOMY     after hysterectomy   POLYPECTOMY  12/12/2023   Procedure: POLYPECTOMY, INTESTINE;  Surgeon: Federico Rosario BROCKS, MD;  Location: [facility] ENDOSCOPY;  Service: Gastroenterology;;   TUBAL LIGATION     VAGINAL HYSTERECTOMY N/A 12/16/2022   Procedure: TOTAL VAGINAL HYSTERECTOMY, BILATERAL SALPINGECTOMY;  Surgeon: Lorence Ozell CROME, MD;  Location: [facility] OR;  Service: Gynecology;  Laterality: N/A;   WISDOM TOOTH EXTRACTION  04/21/2009    Social History   Socioeconomic History   Marital status: Single    Spouse name: Not on file   Number of children: 4   Years of education: college   Highest education level: Some college, no degree  Occupational History    Employer: [employer]    Comment: Tax inspector  Tobacco Use   Smoking status: Former    Current packs/day: 0.25    Average packs/day: 0.3 packs/day for 12.1 years (3.0 ttl pk-yrs)    Types: Cigarettes    Start date: 12/21/2011    Passive exposure: Never   Smokeless tobacco: Never  Vaping Use   Vaping status: Never Used  Substance and Sexual Activity   Alcohol use: Yes    Comment: ocassionally    Drug use: Not Currently    Types: Marijuana    Comment: Last use  was in 2023   Sexual activity: Not Currently    Partners: Male    Birth control/protection: Surgical    Comment: Tubal Ligation  Other Topics Concern   Not on file  Social History Narrative   Lives with her grandmother and her three children   Does not drink caffeine    Social Drivers of Corporate investment banker Strain: Low Risk  (10/16/2023)   Overall Financial Resource Strain (CARDIA)    Difficulty of Paying Living Expenses: Not hard at all  Food Insecurity: No Food Insecurity (12/08/2023)   Hunger Vital Sign    Worried About Running Out of Food in the Last Year: Never true    Ran Out of Food in the Last Year: Never true  Transportation Needs: No  Transportation Needs (12/08/2023)   PRAPARE - Administrator, Civil Service (Medical): No    Lack of Transportation (Non-Medical): No  Physical Activity: Sufficiently Active (10/16/2023)   Exercise Vital Sign    Days of Exercise per Week: 4 days    Minutes of Exercise per Session: 120 min  Stress: Stress Concern Present (10/16/2023)   [employer] of Occupational Health - Occupational Stress Questionnaire    Feeling of Stress: To some extent  Social Connections: Moderately Isolated (10/16/2023)   Social Connection and Isolation Panel    Frequency of Communication with Friends and Family: More than three times a week    Frequency of Social Gatherings with Friends and Family: Patient declined    Attends Religious Services: 1 to 4 times per year    Active Member of [employer] or Organizations: No    Attends Engineer, structural: Not on file    Marital Status: Never married  Intimate Partner Violence: Not At Risk (12/08/2023)   Humiliation, Afraid, Rape, and Kick questionnaire    Fear of Current or Ex-Partner: No    Emotionally Abused: No    Physically Abused: No    Sexually Abused: No    Family History  Problem Relation Age of Onset   Diabetes Mother    Stroke Mother    Colon cancer Father 16   Cancer Father    Hypertension Maternal Grandmother    Diabetes Maternal Grandmother    Hypertension Paternal Grandmother    Esophageal cancer Neg Hx    Liver disease Neg Hx     Current Facility-Administered Medications  Medication Dose Route Frequency Provider Last Rate Last Admin   acetaminophen  (TYLENOL ) tablet 1,000 mg  1,000 mg Oral On Call to OR Sheldon Standing, MD       bupivacaine  liposome (EXPAREL ) 1.3 % injection 266 mg  20 mL Infiltration Once Sheldon Standing, MD       celecoxib (CELEBREX) capsule 200 mg  200 mg Oral On Call to OR Sheldon Standing, MD       dexamethasone  (DECADRON ) injection 4 mg  4 mg Intravenous On Call to OR Cross, Melissa D, NP       ertapenem  (INVANZ) 1 g in sodium chloride  0.9 % 100 mL IVPB  1 g Intravenous On Call to OR Sheldon Standing, MD       feeding supplement (ENSURE PRE-SURGERY) liquid 296 mL  296 mL Oral Once [name] Comer SAUNDERS, MD       feeding supplement (ENSURE PRE-SURGERY) liquid 592 mL  592 mL Oral Once Sheldon Standing, MD       lactated ringers  infusion   Intravenous Continuous Cleotilde Butler Dade, MD 10 mL/hr at 02/10/24  1113 New Bag at 02/10/24 1113   scopolamine  (TRANSDERM-SCOP) 1 MG/3DAYS 1 mg  1 patch Transdermal On Call to OR Cross, Melissa D, NP   1 mg at 02/10/24 1103     Allergies  Allergen Reactions   Keflex  [Cephalexin ] Hives and Itching    Denies difficulty breathing    ROS:   All other systems reviewed & are negative except per HPI or as noted below: Constitutional:  No fevers, chills, sweats.  Weight stable Eyes:  No vision changes, No discharge HENT:  No sore throats, nasal drainage Lymph: No neck swelling, No bruising easily Pulmonary:  No cough, productive sputum CV: No orthopnea, PND  Patient walks 20 minutes without difficulty.  No exertional chest/neck/shoulder/arm pain.  GI: o personal nor family history of GI/colon cancer, inflammatory bowel disease, irritable bowel syndrome, allergy such as Celiac Sprue, dietary/dairy problems, colitis, ulcers nor gastritis.  No recent sick contacts/gastroenteritis.  No travel outside the country.  No changes in diet.  Renal: No UTIs, No hematuria Genital:  No drainage, bleeding, masses Musculoskeletal: No severe joint pain.  Good ROM major joints Skin:  No sores or lesions Heme/Lymph:  No easy bleeding.  No swollen lymph nodes   BP (!) 140/83   Pulse 83   Temp 98.6 F (37 C) (Oral)   Resp 16   Ht 5' 10.5 (1.791 m)   Wt 99.3 kg   LMP  (LMP Unknown) Comment: Depo Injections - last injection 10/2022, had spotting on and off since 10/2022 injection  SpO2 99%   BMI 30.98 kg/m   Physical Exam:  Constitutional: Not cachectic.  Hygeine adequate.   Vitals signs as above.   Eyes: Pupils reactive, normal extraocular movements. Sclera nonicteric Neuro: CN II-XII intact.  No major focal sensory defects.  No major motor deficits. Lymph: No head/neck/groin lymphadenopathy Psych:  No severe agitation.  No severe anxiety.  Judgment & insight Adequate, Oriented x4, HENT: Normocephalic, Mucus membranes moist.  No thrush.   Neck: Supple, No tracheal deviation.  No obvious thyromegaly Chest: No pain to chest wall compression.  Good respiratory excursion.  No audible wheezing CV:  Pulses intact.  regular rhythm.  No major extremity edema  Abdomen:  Flat Hernia: Not present. Diastasis recti: Not present. Soft.   Nondistended.  Nontender.  No hepatomegaly.  No splenomegaly  Gen:  Inguinal hernia: Not present.  Inguinal lymph nodes: without lymphadenopathy.    Rectal: (Deferred)  Ext: No obvious deformity or contracture.  Edema: Not present.  No cyanosis Skin: No major subcutaneous nodules.  Warm and dry Musculoskeletal: Severe joint rigidity not present.  No obvious clubbing.  No digital petechiae.     Results:   Labs: No results found for this or any previous visit (from the past 48 hours).  Imaging / Studies: NM PET Image Restage (PS) Skull Base to Thigh (F-18 FDG) Result Date: 02/10/2024 EXAM: PET AND CT SKULL BASE TO MID THIGH 02/08/2024 10:00:34 AM TECHNIQUE: RADIOPHARMACEUTICAL: 10.9 mCi F-18 FDG Uptake time 60 minutes. Glucose level 93 mg/dl. PET imaging was acquired from the base of the skull to the mid thighs. Non-contrast enhanced computed tomography was obtained for attenuation correction and anatomic localization. COMPARISON: CT 01/28/2024 MRI pelvis 02/08/2024. CLINICAL HISTORY: Cervical cancer, monitor. 10.90 mCi F18 FDG IV R FA (placed by IV team) @ 0848 / HJ; FBG = 93 mg/dl ; RESTAGING FOR MALIGNANT NEOPLASM OF CERVIX ; Vaginal cuff bx 01/21/2024 ; EOV. FINDINGS: HEAD AND NECK: No metabolically active cervical lymphadenopathy.  CHEST: No metabolically active pulmonary nodules. No metabolically active lymphadenopathy. ABDOMEN AND PELVIS: Mild asymmetry of metabolic activity along the left side of the vaginal cuff with SUV max equals 4.6 on image 177. This is in the vicinity of the soft tissue thickening on the comparison pelvic MRI. No hypermetabolic pelvic lymph nodes. No evidence of metastatic adenopathy outside of the pelvis. No visceral metastasis. Physiologic activity within the gastrointestinal and genitourinary systems. BONES AND SOFT TISSUE: No skeletal metastasis. No abnormal FDG activity localizes to the bones. No metabolically active aggressive osseous lesion. IMPRESSION: 1. Mild asymmetric hypermetabolism along the left vaginal cuff (SUV max 4.6) corresponding to soft tissue thickening on prior pelvic MRI. 2. No hypermetabolic pelvic lymphadenopathy. 3. No evidence of extra-pelvic nodal, visceral, or skeletal metastasis. Electronically signed by: Norleen Boxer MD 02/10/2024 10:19 AM EDT RP Workstation: [id number]   MR Hip Right w/o contrast Result Date: 02/09/2024 MR HIP WITHOUT IV CONTRAST COMPARISON: None. CLINICAL HISTORY: Labral tear. Right hip pain. PULSE SEQUENCES: AX T1, Ax T2 FS, Cor T1, COR STIR & SMALL FOV COR PD FS with contrast. FINDINGS: Bones and labrum: There is no fracture or contusion pattern. No accelerated arthrosis is present. No subchondral reactive edema or significant joint effusion is present. Mild degenerative changes in the labrum without evidence of discrete labral tear. Pelvic ring is intact. There is mild sclerosis in the SI joints likely related to chronic osteitis. There is no edema or joint fluid present. Sacrum is unremarkable. Musculotendinous structures: There is mild insertional tendinosis of the gluteus medius and minimus tendons. No bursal collection. Limited evaluation of the intrapelvic structures demonstrate no significant abnormality. IMPRESSION: Mild degenerative changes in the right  hip with slight degenerative changes in the labrum. No discrete labral tear. No accelerated arthrosis or acute abnormality. Mild sclerosis in the iliac wing suggesting osteitis. Otherwise, unremarkable MRI of the right hip. Mild edema in the contralateral left inguinal region with multiple small lymph nodes. Correlation with PET/CT recommended. Mild fluid and lymph nodes are seen in the left intrapelvic region. Electronically signed by: Norleen Satchel MD 02/09/2024 05:32 PM EDT RP Workstation: [id number]   MR Pelvis W Wo Contrast Result Date: 02/09/2024 CLINICAL DATA:  History of cervical cancer, recurrence suspected on vaginal biopsy. EXAM: MRI PELVIS WITHOUT AND WITH CONTRAST TECHNIQUE: Multiplanar multisequence MR imaging of the pelvis was performed both before and after administration of intravenous contrast. CONTRAST:  10mL GADAVIST  GADOBUTROL  1 MMOL/ML IV SOLN COMPARISON:  Multiple priors including CT January 28, 2024 and PET-CT November 16, 2023 and MRI pelvis November 01, 2023. FINDINGS: Urinary Tract: Urinary bladder is unremarkable for degree of distension. Bowel: No evidence of bowel obstruction. Large volume of stool in the rectum. Vascular/Lymphatic: No pathologically enlarged pelvic lymph nodes. Reproductive: Uterus is surgically absent. Increased size of the T2 hyperintense asymmetric nodular focus along the left vaginal cuff now measuring 2.8 x 1.3 cm on image 25/3 previously 1.6 x 1.3 cm. This demonstrates enhancement with restricted diffusion on image 33/4 and reduced ADC signal on image 33/5 Dominant follicle in the right ovary with the additional small follicles in the bilateral ovaries. Other:  No significant abdominal free fluid. Musculoskeletal: No aggressive osseous lesion. IMPRESSION: 1. Increased size of the T2 hyperintense asymmetric nodular focus along the left vaginal cuff now measuring 2.8 x 1.3 cm which demonstrates enhancement with increased DWI signal and reduced ADC signal. Findings are  compatible with given history of suspected recurrence on biopsy. 2. No evidence of pelvic adenopathy. Electronically Signed  By: Reyes Holder M.D.   On: 02/09/2024 07:57   CT ABDOMEN PELVIS W CONTRAST Result Date: 01/28/2024 CLINICAL DATA:  Diverticulitis with complication suspected. Left lower quadrant abdominal pain starting 2 hours ago. Nausea and vomiting today. EXAM: CT ABDOMEN AND PELVIS WITH CONTRAST TECHNIQUE: Multidetector CT imaging of the abdomen and pelvis was performed using the standard protocol following bolus administration of intravenous contrast. RADIATION DOSE REDUCTION: This exam was performed according to the departmental dose-optimization program which includes automated exposure control, adjustment of the mA and/or kV according to patient size and/or use of iterative reconstruction technique. CONTRAST:  OMNIPAQUE  IOHEXOL  300 MG/ML  SOLN COMPARISON:  12/08/2023 FINDINGS: Lower chest: Lung bases are clear. Hepatobiliary: Mild diffuse fatty infiltration. No focal liver lesions. Gallbladder and bile ducts are normal. Pancreas: Unremarkable. No pancreatic ductal dilatation or surrounding inflammatory changes. Spleen: Normal in size without focal abnormality. Adrenals/Urinary Tract: Adrenal glands are unremarkable. Kidneys are normal, without renal calculi, focal lesion, or hydronephrosis. Bladder is unremarkable. Stomach/Bowel: Stomach, small bowel, and colon are not abnormally distended. Under distention of the colon limits evaluation but there appears to be diffuse colonic wall thickening and edema likely indicating colitis. This could represent infectious or inflammatory colitis. Similar appearance to previous study. No pericolonic abscess. Appendix is normal. Vascular/Lymphatic: No significant vascular findings are present. No enlarged abdominal or pelvic lymph nodes. Reproductive: Uterus is surgically absent.  No adnexal masses. Other: No free air or free fluid in the abdomen.  Abdominal wall musculature appears intact. Musculoskeletal: No acute or significant osseous findings. IMPRESSION: 1. Diffuse colonic wall thickening likely representing infectious or inflammatory colitis. Similar appearance to previous study. No bowel obstruction. 2. Mild diffuse fatty infiltration of the liver. Electronically Signed   By: Elsie Gravely M.D.   On: 01/28/2024 21:03   US  PELVIC COMPLETE WITH TRANSVAGINAL Result Date: 01/19/2024 CLINICAL DATA:  Follow-up examination for ovarian cyst. History of prior hysterectomy. EXAM: TRANSABDOMINAL AND TRANSVAGINAL ULTRASOUND OF PELVIS TECHNIQUE: Both transabdominal and transvaginal ultrasound examinations of the pelvis were performed. Transabdominal technique was performed for global imaging of the pelvis including uterus, ovaries, adnexal regions, and pelvic cul-de-sac. It was necessary to proceed with endovaginal exam following the transabdominal exam to visualize the ovaries. COMPARISON:  Ultrasound from 10/25/2023 FINDINGS: Uterus Prior hysterectomy.  No abnormality about the vaginal cuff. Endometrium Surgically absent. Right ovary Measurements: 4.8 x 3.7 x 3.7 cm = volume: 34.2 mL. 3.5 x 3.2 x 2.9 cm complex hypoechoic cyst with internal lace-like architecture, most characteristic of a hemorrhagic cyst. No internal vascularity or solid component. This is new from prior ultrasound. Left ovary Measurements: 2.6 x 2.0 x 2.0 cm = volume: 5.5 mL. Previously seen complex left ovarian cyst has resolved in the interim, consistent with a resolved hemorrhagic cyst. No other adnexal mass. Other findings Trace free fluid within the pelvis. IMPRESSION: 1. Interval resolution of previously seen complex left ovarian cyst, consistent with a resolved hemorrhagic cyst. 2. New 3.5 cm complex right ovarian cyst, most characteristic of a benign hemorrhagic cyst. A short interval follow-up ultrasound in 6-12 weeks could be considered to ensure resolution as warranted. 3.  Prior hysterectomy. Electronically Signed   By: Morene Hoard M.D.   On: 01/19/2024 04:13    Medications / Allergies: per chart  Antibiotics: Anti-infectives (From admission, onward)    Start     Dose/Rate Route Frequency Ordered Stop   02/10/24 1400  neomycin (MYCIFRADIN) tablet 1,000 mg  Status:  Discontinued  Placed in And Linked Group   1,000 mg Oral 3 times per day 02/10/24 1019 02/10/24 1023   02/10/24 1400  metroNIDAZOLE  (FLAGYL ) tablet 1,000 mg  Status:  Discontinued       Placed in And Linked Group   1,000 mg Oral 3 times per day 02/10/24 1019 02/10/24 1023   02/10/24 1030  ertapenem (INVANZ) 1 g in sodium chloride  0.9 % 100 mL IVPB        1 g 200 mL/hr over 30 Minutes Intravenous On call to O.R. 02/10/24 1019 02/11/24 0559         Note: Portions of this report may have been transcribed using voice recognition software. Every effort was made to ensure accuracy; however, inadvertent computerized transcription errors may be present.   Any transcriptional errors that result from this process are unintentional.    Elspeth KYM Schultze, MD, FACS, MASCRS Esophageal, Gastrointestinal & Colorectal Surgery Robotic and Minimally Invasive Surgery  [facility] [address] [place], [place] [address] [phone] Fax [phone] Main  CONTACT INFORMATION: Weekday (9AM-5PM): Call [facility] main office at [phone] Weeknight (5PM-9AM) or Weekend/Holiday: Check EPIC Web Links tab & use AMION (password  [email or web address]) for General Surgery [facility] coverage  Please, DO NOT use SecureChat  (it is not reliable communication to reach operating surgeons & will lead to a delay in care).   Epic staff messaging available for outpatient concerns needing 1-2 business day response.       02/10/2024  11:16 AM

## 2024-02-10 NOTE — Brief Op Note (Signed)
 02/10/2024  4:35 PM  PATIENT:  Holly Brown  38 y.o. 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 History of cervical cancer.  POST-OPERATIVE DIAGNOSIS:  Squamous cell carcinoma, HX CERVICAL CANCER  PROCEDURE:  Procedure(s) with comments: UPPER VAGINECTOMY, LEFT PARAMETRECTOMY, LEFT URETEROLYSIS (N/A) - ROBOTIC  ASSISTED UPPER VAGINECTOMY CYSTOSCOPY WITH INDOCYANINE GREEN IMAGING (ICG) (Bilateral) Ovarian transposition  SURGEON:  Surgeons and Role: Panel 1:    DEWAINE [name] Comer JONELLE, MD - Primary    * Rogelio Planas, MD - Assisting Panel 2:    * Cam Morene ORN, MD - Primary  ANESTHESIA:   general  EBL:  200 mL   BLOOD ADMINISTERED:none  DRAINS: none   LOCAL MEDICATIONS USED:  marcaine   SPECIMEN:  left remnant round ligament, upper vaginectomy  DISPOSITION OF SPECIMEN:  PATHOLOGY  COUNTS:  YES  TOURNIQUET:  * No tourniquets in log *  DICTATION: .Note written in EPIC  PLAN OF CARE: Admit for overnight observation  PATIENT DISPOSITION:  PACU - hemodynamically stable.   Delay start of Pharmacological VTE agent (>24hrs) due to surgical blood loss or risk of bleeding: no

## 2024-02-10 NOTE — Anesthesia Preprocedure Evaluation (Addendum)
 Anesthesia Evaluation  Patient identified by MRN, date of birth, ID band Patient awake    Reviewed: Allergy & Precautions, NPO status , Patient's Chart, lab work & pertinent test results  History of Anesthesia Complications (+) PONV and history of anesthetic complications  Airway Mallampati: II  TM Distance: >3 FB Neck ROM: Full    Dental  (+) Dental Advisory Given   Pulmonary neg shortness of breath, sleep apnea (patient denies) , neg COPD, neg recent URI, former smoker   Pulmonary exam normal breath sounds clear to auscultation       Cardiovascular (-) hypertension(-) angina + Peripheral Vascular Disease  (-) Past MI, (-) Cardiac Stents and (-) CABG (-) dysrhythmias  Rhythm:Regular Rate:Normal  TTE 07/06/2014: Study Conclusions   - Left ventricle: The cavity size was normal. Wall thickness was    normal. Systolic function was normal. The estimated ejection    fraction was in the range of 55% to 60%. Wall motion was normal;    there were no regional wall motion abnormalities. Left    ventricular diastolic function parameters were normal.  - Mitral valve: Borderline thickened leaflets . There was trivial    regurgitation.  - Left atrium: The atrium was normal in size.  - Tricuspid valve: There was trivial regurgitation.  - Pulmonary arteries: PA peak pressure: 26 mm Hg (S).  - Inferior vena cava: The vessel was normal in size. The    respirophasic diameter changes were in the normal range (>= 50%),    consistent with normal central venous pressure.     Neuro/Psych  Headaches, Seizures - (x1 several years ago, not on medications),  PSYCHIATRIC DISORDERS Anxiety Depression     Neuromuscular disease (left-sided sciatica)    GI/Hepatic hiatal hernia, PUD,GERD  Medicated,,Fatty liver   Endo/Other  negative endocrine ROS    Renal/GU negative Renal ROS     Musculoskeletal   Abdominal   Peds  Hematology negative  hematology ROS (+) Lab Results      Component                Value               Date                      WBC                      7.7                 02/04/2024                HGB                      12.7                02/04/2024                HCT                      40.3                02/04/2024                MCV                      94.8                02/04/2024  PLT                      385                 02/04/2024              Anesthesia Other Findings   Reproductive/Obstetrics H/o cervical cancer                              Anesthesia Physical Anesthesia Plan  ASA: 3  Anesthesia Plan: General   Post-op Pain Management: Tylenol  PO (pre-op)*   Induction:   PONV Risk Score and Plan: 4 or greater and Ondansetron , Dexamethasone , Midazolam , Scopolamine  patch - Pre-op and Treatment may vary due to age or medical condition  Airway Management Planned: Oral ETT  Additional Equipment:   Intra-op Plan:   Post-operative Plan: Extubation in OR  Informed Consent: I have reviewed the patients History and Physical, chart, labs and discussed the procedure including the risks, benefits and alternatives for the proposed anesthesia with the patient or authorized representative who has indicated his/her understanding and acceptance.     Dental advisory given  Plan Discussed with: CRNA and Anesthesiologist  Anesthesia Plan Comments: (Risks of general anesthesia discussed including, but not limited to, sore throat, hoarse voice, chipped/damaged teeth, injury to vocal cords, nausea and vomiting, allergic reactions, lung infection, heart attack, stroke, and death. All questions answered. )         Anesthesia Quick Evaluation

## 2024-02-10 NOTE — Anesthesia Procedure Notes (Signed)
 Procedure Name: Intubation Date/Time: 02/10/2024 12:34 PM  Performed by: Gladis Honey, CRNAPre-anesthesia Checklist: Patient identified, Emergency Drugs available, Suction available and Patient being monitored Patient Re-evaluated:Patient Re-evaluated prior to induction Oxygen Delivery Method: Circle System Utilized Preoxygenation: Pre-oxygenation with 100% oxygen Induction Type: IV induction Ventilation: Mask ventilation without difficulty Laryngoscope Size: Mac and 3 Grade View: Grade I Tube type: Oral Tube size: 7.0 mm Number of attempts: 1 Airway Equipment and Method: Stylet and Oral airway Placement Confirmation: ETT inserted through vocal cords under direct vision, positive ETCO2 and breath sounds checked- equal and bilateral Secured at: 22 cm Tube secured with: Tape Dental Injury: Teeth and Oropharynx as per pre-operative assessment  Comments: Intubated per medical student under direct supervision of MDA

## 2024-02-11 ENCOUNTER — Encounter (HOSPITAL_COMMUNITY): Payer: Self-pay | Admitting: Gynecologic Oncology

## 2024-02-11 ENCOUNTER — Ambulatory Visit: Admitting: Physician Assistant

## 2024-02-11 DIAGNOSIS — C538 Malignant neoplasm of overlapping sites of cervix uteri: Principal | ICD-10-CM

## 2024-02-11 LAB — BASIC METABOLIC PANEL WITH GFR
Anion gap: 13 (ref 5–15)
BUN: 7 mg/dL (ref 6–20)
CO2: 21 mmol/L — ABNORMAL LOW (ref 22–32)
Calcium: 8.9 mg/dL (ref 8.9–10.3)
Chloride: 102 mmol/L (ref 98–111)
Creatinine, Ser: 0.89 mg/dL (ref 0.44–1.00)
GFR, Estimated: >60 mL/min (ref >60)
Glucose, Bld: 131 mg/dL — ABNORMAL HIGH (ref 70–99)
Potassium: 3.6 mmol/L (ref 3.5–5.1)
Sodium: 136 mmol/L (ref 135–145)

## 2024-02-11 LAB — CBC
HCT: 37.8 % (ref 36.0–46.0)
Hemoglobin: 12.4 g/dL (ref 12.0–15.0)
MCH: 31.2 pg (ref 26.0–34.0)
MCHC: 32.8 g/dL (ref 30.0–36.0)
MCV: 95 fL (ref 80.0–100.0)
Platelets: 373 K/uL (ref 150–400)
RBC: 3.98 MIL/uL (ref 3.87–5.11)
RDW: 12.9 % (ref 11.5–15.5)
WBC: 13.7 K/uL — ABNORMAL HIGH (ref 4.0–10.5)
nRBC: 0 % (ref 0.0–0.2)

## 2024-02-11 MED ORDER — PANTOPRAZOLE SODIUM 40 MG PO TBEC
40.0000 mg | DELAYED_RELEASE_TABLET | Freq: Every day | ORAL | Status: DC
  Administered 2024-02-11 – 2024-02-14 (×4): 40 mg via ORAL
  Filled 2024-02-11 (×4): qty 1

## 2024-02-11 MED ORDER — CALCIUM CARBONATE ANTACID 500 MG PO CHEW
1.0000 | CHEWABLE_TABLET | Freq: Three times a day (TID) | ORAL | Status: DC
  Administered 2024-02-11 – 2024-02-14 (×10): 200 mg via ORAL
  Filled 2024-02-11 (×10): qty 1

## 2024-02-11 MED ORDER — SIMETHICONE 80 MG PO CHEW
80.0000 mg | CHEWABLE_TABLET | Freq: Four times a day (QID) | ORAL | Status: DC
  Administered 2024-02-11 – 2024-02-14 (×12): 80 mg via ORAL
  Filled 2024-02-11 (×12): qty 1

## 2024-02-11 NOTE — Progress Notes (Signed)
 Entered pt's room to discontinue foley. Educated her on the process. Pt declined and stated she wishes to speak with Dr. [name] first before foley cathter is removed.

## 2024-02-11 NOTE — Progress Notes (Signed)
 1 Day Post-Op Procedure(s) (LRB): UPPER VAGINECTOMY, LEFT PARAMETRECTOMY, LEFT URETEROLYSIS, OVARIAN TRANSPOSITION (N/A) CYSTOSCOPY WITH INDOCYANINE GREEN IMAGING (ICG) (Bilateral)  Subjective: Patient reports severe heartburn this am. Has used tums in the past at home if needed. Has been sipping on water and no solid food. Feelings of nausea related to the heartburn, no emesis. No flatus. Ambulated last pm with assist. Reports having abdominal soreness. Heartburn discomfort felt more with deep breaths.  Objective: Vital signs in last 24 hours: Temp:  [97.7 F (36.5 C)-99.1 F (37.3 C)] 99.1 F (37.3 C) (10/23 0627) Pulse Rate:  [51-96] 80 (10/23 0627) Resp:  [10-16] 16 (10/23 0627) BP: (125-151)/(68-89) 129/73 (10/23 0627) SpO2:  [99 %-100 %] 100 % (10/23 0627) Weight:  [219 lb (99.3 kg)] 219 lb (99.3 kg) (10/22 1037)    Intake/Output from previous day: 10/22 0701 - 10/23 0700 In: 1532.7 [I.V.:1432.7; IV Piggyback:100] Out: 1550 [Urine:1350; Blood:200]  Physical Examination: General: alert, cooperative, no distress, and appears uncomfortable related to heartburn Resp: clear to auscultation bilaterally Cardio: regular rate and rhythm, S1, S2 normal, no murmur, click, rub or gallop GI: incision: lap sites to the abdomen intact with dermabond without drainage and abdomen is soft, active bowel sounds, appropriately tender Extremities: extremities normal, atraumatic, no cyanosis or edema Foley in place with clear, yellow urine  Labs: WBC/Hgb/Hct/Plts:  13.7/12.4/37.8/373 (10/23 0444) BUN/Cr/glu/ALT/AST/amyl/lip:  7/0.89/--/--/--/--/-- (10/23 0444)  Assessment: 38 y.o. s/p Procedure(s): UPPER VAGINECTOMY, LEFT PARAMETRECTOMY, LEFT URETEROLYSIS, OVARIAN TRANSPOSITION CYSTOSCOPY WITH INDOCYANINE GREEN IMAGING (ICG): stable Pain:  Pain is well-controlled on PRN medications.  Heme: Hgb 12.4 and Hct 37.8 this am. Appropriate given preop values and surgical losses.  ID: WBC 13.7 this  am. Given decadron  and intra-op abx. No evidence of infection at this time.   CV: BP and HR stable. Continue to monitor with ordered vital signs.  GI:  Tolerating po: sips of water, decreased intake related to heartburn.  GU: Foley in place. Creatinine 0.89. Patient to be discharged home with foley in place.     FEN: No critical values on am labs.  Prophylaxis: SCDs and lovenox  ordered.  Plan: Medications for heartburn ordered. Diet as tolerated. Foley teaching. To remain at discharge.  If meeting milestones later today, could possibly be discharged at that time.  Continue with plan of care.   LOS: 0 days    Holly Brown 02/11/2024, 7:39 AM

## 2024-02-11 NOTE — Consult Note (Signed)
 WOC Nurse wound follow up GYN surgical team following for assessment and plan of care. Pt did not receive an ostomy in the OR yesterday. No further role for WOC team.  Please re-consult if further assistance is needed.  Thank-you,  Stephane Fought MSN, RN, CWOCN, CWCN-AP, CNS Contact Mon-Fri 0700-1500: [phone]

## 2024-02-11 NOTE — Progress Notes (Signed)
   02/11/24 0943  TOC Brief Assessment  Insurance and Status Reviewed  Patient has primary care physician Yes  Home environment has been reviewed home with family support  Prior level of function: independent  Prior/Current Home Services No current home services  Social Drivers of Health Review SDOH reviewed no interventions necessary  Readmission risk has been reviewed Yes  Transition of care needs no transition of care needs at this time

## 2024-02-11 NOTE — Progress Notes (Signed)
 Pt reported having heartburn. She stated that she hasn't had it this bad before. Reported feeling nauseated. Medicated with Zofran . She also requested apple juice as well. Using incentive spirometer and able to raise it up to . Nurse raised HOB to 40 degrees. Encouraged pt to keep HOB elevated.  She is warm, dry,no visible distress.

## 2024-02-11 NOTE — Progress Notes (Signed)
 GYN Oncology Progress Note  Patient sitting in chair in no acute distress. Reports having moderate gas pains. Reflux is improving. She has been ambulating. Has started increasing intake of solid food. Plan for continued monitoring and re-consideration for discharge tomorrow if improvement in symptoms and meeting milestones.

## 2024-02-11 NOTE — Plan of Care (Signed)
  Problem: Education: Goal: Understanding of discharge needs will improve Outcome: Progressing Goal: Verbalization of understanding of the causes of altered bowel function will improve Outcome: Progressing   Problem: Activity: Goal: Ability to tolerate increased activity will improve Outcome: Progressing

## 2024-02-12 DIAGNOSIS — Z881 Allergy status to other antibiotic agents status: Secondary | ICD-10-CM | POA: Diagnosis not present

## 2024-02-12 DIAGNOSIS — F17210 Nicotine dependence, cigarettes, uncomplicated: Secondary | ICD-10-CM | POA: Diagnosis not present

## 2024-02-12 DIAGNOSIS — K219 Gastro-esophageal reflux disease without esophagitis: Secondary | ICD-10-CM | POA: Diagnosis not present

## 2024-02-12 DIAGNOSIS — C539 Malignant neoplasm of cervix uteri, unspecified: Secondary | ICD-10-CM | POA: Diagnosis present

## 2024-02-12 DIAGNOSIS — K760 Fatty (change of) liver, not elsewhere classified: Secondary | ICD-10-CM | POA: Diagnosis not present

## 2024-02-12 DIAGNOSIS — Z9079 Acquired absence of other genital organ(s): Secondary | ICD-10-CM | POA: Diagnosis not present

## 2024-02-12 DIAGNOSIS — Z90710 Acquired absence of both cervix and uterus: Secondary | ICD-10-CM | POA: Diagnosis not present

## 2024-02-12 DIAGNOSIS — Z79899 Other long term (current) drug therapy: Secondary | ICD-10-CM | POA: Diagnosis not present

## 2024-02-12 DIAGNOSIS — Z8249 Family history of ischemic heart disease and other diseases of the circulatory system: Secondary | ICD-10-CM | POA: Diagnosis not present

## 2024-02-12 DIAGNOSIS — G4730 Sleep apnea, unspecified: Secondary | ICD-10-CM | POA: Diagnosis not present

## 2024-02-12 DIAGNOSIS — C538 Malignant neoplasm of overlapping sites of cervix uteri: Secondary | ICD-10-CM | POA: Diagnosis not present

## 2024-02-12 DIAGNOSIS — F32A Depression, unspecified: Secondary | ICD-10-CM | POA: Diagnosis not present

## 2024-02-12 DIAGNOSIS — I739 Peripheral vascular disease, unspecified: Secondary | ICD-10-CM | POA: Diagnosis not present

## 2024-02-12 DIAGNOSIS — F419 Anxiety disorder, unspecified: Secondary | ICD-10-CM | POA: Diagnosis not present

## 2024-02-12 DIAGNOSIS — Z87440 Personal history of urinary (tract) infections: Secondary | ICD-10-CM | POA: Diagnosis not present

## 2024-02-12 LAB — BASIC METABOLIC PANEL WITH GFR
Anion gap: 10 (ref 5–15)
BUN: <5 mg/dL — ABNORMAL LOW (ref 6–20)
CO2: 25 mmol/L (ref 22–32)
Calcium: 9.2 mg/dL (ref 8.9–10.3)
Chloride: 103 mmol/L (ref 98–111)
Creatinine, Ser: 0.81 mg/dL (ref 0.44–1.00)
GFR, Estimated: >60 mL/min (ref >60)
Glucose, Bld: 91 mg/dL (ref 70–99)
Potassium: 3.6 mmol/L (ref 3.5–5.1)
Sodium: 137 mmol/L (ref 135–145)

## 2024-02-12 LAB — CBC
HCT: 39 % (ref 36.0–46.0)
Hemoglobin: 12.1 g/dL (ref 12.0–15.0)
MCH: 30 pg (ref 26.0–34.0)
MCHC: 31 g/dL (ref 30.0–36.0)
MCV: 96.5 fL (ref 80.0–100.0)
Platelets: 318 K/uL (ref 150–400)
RBC: 4.04 MIL/uL (ref 3.87–5.11)
RDW: 12.9 % (ref 11.5–15.5)
WBC: 9.7 K/uL (ref 4.0–10.5)
nRBC: 0 % (ref 0.0–0.2)

## 2024-02-12 LAB — SURGICAL PATHOLOGY

## 2024-02-12 LAB — LACTIC ACID, PLASMA: Lactic Acid, Venous: 1 mmol/L (ref 0.5–1.9)

## 2024-02-12 MED ORDER — CHEWING GUM (ORBIT) SUGAR FREE
1.0000 | CHEWING_GUM | Freq: Three times a day (TID) | ORAL | Status: DC
  Administered 2024-02-12 – 2024-02-14 (×6): 1 via ORAL
  Filled 2024-02-12 (×2): qty 1

## 2024-02-12 MED ORDER — HYDROMORPHONE HCL 1 MG/ML IJ SOLN
0.5000 mg | Freq: Once | INTRAMUSCULAR | Status: DC
  Filled 2024-02-12: qty 0.5

## 2024-02-12 MED ORDER — METHOCARBAMOL 500 MG PO TABS
500.0000 mg | ORAL_TABLET | Freq: Four times a day (QID) | ORAL | Status: DC | PRN
  Administered 2024-02-12 – 2024-02-14 (×7): 500 mg via ORAL
  Filled 2024-02-12 (×8): qty 1

## 2024-02-12 NOTE — Progress Notes (Signed)
 GYN Oncology Progress Note  Patient is alert, oriented, resting in the chair, in no acute distress.  She continues to report intermittent left lower quadrant pain.  Continues to describe this as a tightening then releasing feeling.  No flatus or BM reported.  No nausea or emesis but has a decreased appetite.  She has ambulated in the hall which slightly helped her symptoms.  The heating pad is also helping as well.  Current heart rate at 93-96.  O2 sat at 99 on room air.  Abdomen remains soft, slightly distended, more active bowel sounds than a.m. assessment, with light pressure from stethoscope on abdomen, no grimacing or increased tenderness reported. Foley continues to have clear, yellow urine. LDH ordered by Dr. [name]. Continue with current plan of care. Pain appears to be bowel/gas pain related. No needs voiced per pt.

## 2024-02-12 NOTE — Progress Notes (Signed)
 GYN ONC Progress Note  Patient seen with Dr. [name]. Patient is alert, oriented, resting in bed, in no acute distress.  She continues to have gas pains.  She did ambulate in the hall this afternoon and had a small episode of flatus.  No BM.  She is staying hydrated.  No needs voiced at this time.  Continue with current plan of care.

## 2024-02-12 NOTE — Progress Notes (Signed)
 2 Days Post-Op Procedure(s) (LRB): UPPER VAGINECTOMY, LEFT PARAMETRECTOMY, LEFT URETEROLYSIS, OVARIAN TRANSPOSITION (N/A) CYSTOSCOPY WITH INDOCYANINE GREEN IMAGING (ICG) (Bilateral)  Subjective: Patient reports feeling terrible this am. Having moderate to severe at times left lower quadrant pain. Reports the pain as intermittent with a pressure, tightening then releasing sensation.  Abdominal pain seems to intensify when taking a deep breath. Burping intermittently with no flatus. Heartburn experienced yesterday has improved. Has decreased appetite. No nausea or emesis. States the food is tasting spicy and she is not able to tolerate this. She has been sipping on liquids.    Objective: Vital signs in last 24 hours: Temp:  [97.9 F (36.6 C)-98.9 F (37.2 C)] 97.9 F (36.6 C) (10/24 0552) Pulse Rate:  [65-94] 79 (10/24 0552) Resp:  [17-18] 18 (10/24 0552) BP: (119-131)/(71-88) 124/77 (10/24 0552) SpO2:  [96 %-100 %] 96 % (10/24 0552)    Intake/Output from previous day: 10/23 0701 - 10/24 0700 In: 929.4 [P.O.:840; I.V.:89.4] Out: 4075 [Urine:4075]  Physical Examination: General: alert, cooperative, and no distress Resp: clear to auscultation bilaterally Cardio: regular rate and rhythm, S1, S2 normal, no murmur, click, rub or gallop GI: incision: lap sites to the abdomen intact with dermabond without drainage and abdomen is soft, slightly more distended than yesterday, moderately hypoactive bowel sounds, appropriately tender, non-tympanic Extremities: extremities normal, atraumatic, no cyanosis or edema Foley in place with clear, yellow urine  Labs: WBC/Hgb/Hct/Plts:  9.7/12.1/39.0/318 (10/24 9368) BUN/Cr/glu/ALT/AST/amyl/lip:  <5/0.81/--/--/--/--/-- (10/24 9368)  Assessment: 38 y.o. s/p Procedure(s): UPPER VAGINECTOMY, LEFT PARAMETRECTOMY, LEFT URETEROLYSIS, OVARIAN TRANSPOSITION CYSTOSCOPY WITH INDOCYANINE GREEN IMAGING (ICG): stable Pain:  Having increased left abdominal pain  this am. Will add additional prn medications.  Heme: Hgb 12.1 and Hct 39 this am. Appropriate given preop values and surgical losses.  ID: WBC 9.7 this am. Given decadron  and intra-op abx. No evidence of infection at this time.   CV: BP and HR stable. Continue to monitor with ordered vital signs.  GI:  Tolerating po: sips of liquid, decreased intake related to taste of food, abdominal pain. No evidence of bowel return at this time.  GU: Foley in place. Creatinine 0.81. Patient to be discharged home with foley in place.     FEN: No critical values on am labs.  Prophylaxis: SCDs and lovenox  ordered.  Plan: Kpad. Adding robaxin  prn. Encourage ambulation, IS use Foley teaching. To remain at discharge.  Continue with plan of care. Discharge on hold due to worsening pain, decreased intake, no evidence of return of bowel function.   LOS: 0 days    Holly Brown 02/12/2024, 7:51 AM

## 2024-02-12 NOTE — Treatment Plan (Signed)
 Discussed results from surgery with the patient.  Negative margins but close margin at 2 mm.  Perineural invasion and concern for LVI.  Discussed likely recommendation for adjuvant radiation once she heals, plan to review her case at tumor board on Monday.  Comer Dollar MD Gynecologic Oncology

## 2024-02-13 LAB — CBC
HCT: 38.1 % (ref 36.0–46.0)
Hemoglobin: 12.1 g/dL (ref 12.0–15.0)
MCH: 29.9 pg (ref 26.0–34.0)
MCHC: 31.8 g/dL (ref 30.0–36.0)
MCV: 94.1 fL (ref 80.0–100.0)
Platelets: 348 K/uL (ref 150–400)
RBC: 4.05 MIL/uL (ref 3.87–5.11)
RDW: 12.7 % (ref 11.5–15.5)
WBC: 8.3 K/uL (ref 4.0–10.5)
nRBC: 0 % (ref 0.0–0.2)

## 2024-02-13 LAB — BASIC METABOLIC PANEL WITH GFR
Anion gap: 10 (ref 5–15)
BUN: <5 mg/dL — ABNORMAL LOW (ref 6–20)
CO2: 25 mmol/L (ref 22–32)
Calcium: 9.2 mg/dL (ref 8.9–10.3)
Chloride: 102 mmol/L (ref 98–111)
Creatinine, Ser: 0.84 mg/dL (ref 0.44–1.00)
GFR, Estimated: >60 mL/min (ref >60)
Glucose, Bld: 93 mg/dL (ref 70–99)
Potassium: 3.3 mmol/L — ABNORMAL LOW (ref 3.5–5.1)
Sodium: 137 mmol/L (ref 135–145)

## 2024-02-13 MED ORDER — HYDROMORPHONE HCL 1 MG/ML IJ SOLN
0.5000 mg | INTRAMUSCULAR | Status: DC | PRN
  Administered 2024-02-13: 0.5 mg via INTRAVENOUS
  Filled 2024-02-13: qty 0.5

## 2024-02-13 MED ORDER — OXYCODONE HCL 5 MG PO TABS
5.0000 mg | ORAL_TABLET | ORAL | Status: DC | PRN
  Administered 2024-02-13: 5 mg via ORAL
  Filled 2024-02-13: qty 1

## 2024-02-13 MED ORDER — OXYCODONE HCL 5 MG PO TABS
10.0000 mg | ORAL_TABLET | ORAL | Status: DC | PRN
  Administered 2024-02-13 – 2024-02-14 (×3): 10 mg via ORAL
  Filled 2024-02-13 (×3): qty 2

## 2024-02-13 MED ORDER — POLYETHYLENE GLYCOL 3350 17 G PO PACK
17.0000 g | PACK | Freq: Every day | ORAL | Status: DC
  Administered 2024-02-13 – 2024-02-14 (×2): 17 g via ORAL
  Filled 2024-02-13 (×2): qty 1

## 2024-02-13 MED ORDER — POTASSIUM CHLORIDE CRYS ER 20 MEQ PO TBCR
40.0000 meq | EXTENDED_RELEASE_TABLET | Freq: Once | ORAL | Status: AC
  Administered 2024-02-13: 40 meq via ORAL
  Filled 2024-02-13: qty 2

## 2024-02-13 NOTE — Progress Notes (Signed)
 Pt reported that she was upset that she can't heat her food up in the microwave, on the unit. Nurse explained to her the hospital policy on this. She stated that had she known this, she wouldn't have had someone bring her food to the unit. She then proceeded to say that she is thinking of leaving the hospital. Nurse asked her what her intentions were in terms of staying or leaving AMA tonight. Pt stated that she will stay tonight and have someone heat up her food.

## 2024-02-13 NOTE — Plan of Care (Signed)
  Problem: Activity: Goal: Ability to tolerate increased activity will improve Outcome: Progressing   Problem: Nutritional: Goal: Will attain and maintain optimal nutritional status will improve Outcome: Progressing   Problem: Clinical Measurements: Goal: Postoperative complications will be avoided or minimized Outcome: Progressing   Problem: Clinical Measurements: Goal: Ability to maintain clinical measurements within normal limits will improve Outcome: Progressing

## 2024-02-13 NOTE — Plan of Care (Signed)
  Problem: Education: Goal: Understanding of discharge needs will improve Outcome: Progressing Goal: Verbalization of understanding of the causes of altered bowel function will improve Outcome: Progressing   Problem: Activity: Goal: Ability to tolerate increased activity will improve Outcome: Progressing   Problem: Bowel/Gastric: Goal: Gastrointestinal status for postoperative course will improve Outcome: Progressing   Problem: Respiratory: Goal: Respiratory status will improve Outcome: Progressing   Problem: Elimination: Goal: Will not experience complications related to bowel motility Outcome: Progressing Goal: Will not experience complications related to urinary retention Outcome: Progressing   Problem: Pain Managment: Goal: General experience of comfort will improve and/or be controlled Outcome: Progressing   Problem: Safety: Goal: Ability to remain free from injury will improve Outcome: Progressing

## 2024-02-13 NOTE — Progress Notes (Signed)
 3 Days Post-Op Procedure(s) (LRB): UPPER VAGINECTOMY, LEFT PARAMETRECTOMY, LEFT URETEROLYSIS, OVARIAN TRANSPOSITION (N/A) CYSTOSCOPY WITH INDOCYANINE GREEN IMAGING (ICG) (Bilateral)  Subjective: Patient reports that she is feeling better than yesterday.  She started passing some gas this morning which has provided much relief.  She only took in small amounts of food yesterday because of the pain, reflux, poor appetite.  She wants to try to eat a little bit more today.  No vomiting overnight or this morning.  She reports that the oxycodone  5 is not always enough for pain control and so she has used a few doses of IV Dilaudid .  She feels that she may be able to use less IV Dilaudid  if we increase oxycodone  dose some.  She is walking well.  Voiding via the Foley.  Objective: Vital signs in last 24 hours: Temp:  [98.3 F (36.8 C)-99.2 F (37.3 C)] 98.3 F (36.8 C) (10/25 0530) Pulse Rate:  [77-130] 85 (10/25 0530) Resp:  [16-18] 16 (10/25 0530) BP: (133-147)/(82-95) 138/86 (10/25 0530) SpO2:  [99 %-100 %] 100 % (10/25 0530)    Intake/Output from previous day: 10/24 0701 - 10/25 0700 In: 650 [P.O.:650] Out: 2700 [Urine:2700]  Physical Examination: General: alert, cooperative, and no distress Resp: clear to auscultation bilaterally Cardio: regular rate and rhythm, S1, S2 normal, no murmur, click, rub or gallop GI: incision: lap sites to the abdomen intact with dermabond without drainage and abdomen is soft, nondistended, nontympanitic, bowel sounds present, slightly hypoactive Extremities: extremities normal, atraumatic, no cyanosis or edema Foley in place with clear, yellow urine  Labs:      Assessment: 38 y.o. s/p Procedure(s): UPPER VAGINECTOMY, LEFT PARAMETRECTOMY, LEFT URETEROLYSIS, OVARIAN TRANSPOSITION CYSTOSCOPY WITH INDOCYANINE GREEN IMAGING (ICG): stable Pain: Improvement in pain since starting passing gas.  Increased oxycodone  to 5 to 10 mg.  Heme: Hemoglobin stable  yesterday.  Will recheck labs this morning.  ID: Afebrile.  Will recheck labs this morning.  CV: BP and HR stable.  Tachycardia resolved  GI:  Tolerating po: Tolerating liquids and small bites of food.  Interested in trying more food today.  Now passing gas.  GU: Foley in place. Creatinine follow-up with labs this morning.  Patient to be discharged home with foley in place if discharged today.  FEN: Follow-up labs this morning  Prophylaxis: SCDs and lovenox  ordered.  Plan: Kpad. Increase oxycodone  to 5 to 10 mg.  Can continue with IV Dilaudid  for breakthrough pain but will try to minimize IV meds in anticipation of working towards discharge. Encourage ambulation, IS use Foley teaching. To remain at discharge if discharged today. Continue with plan of care. Will reevaluate in the afternoon to see how p.o. intake went with breakfast and lunch to determine if safe for discharge today versus tomorrow. MiraLAX  added daily now that she is passing gas.   LOS: 1 day    Terell Kincy 02/13/2024, 9:03 AM

## 2024-02-14 LAB — BASIC METABOLIC PANEL WITH GFR
Anion gap: 11 (ref 5–15)
BUN: 6 mg/dL (ref 6–20)
CO2: 24 mmol/L (ref 22–32)
Calcium: 9.4 mg/dL (ref 8.9–10.3)
Chloride: 100 mmol/L (ref 98–111)
Creatinine, Ser: 0.83 mg/dL (ref 0.44–1.00)
GFR, Estimated: >60 mL/min (ref >60)
Glucose, Bld: 125 mg/dL — ABNORMAL HIGH (ref 70–99)
Potassium: 3.6 mmol/L (ref 3.5–5.1)
Sodium: 136 mmol/L (ref 135–145)

## 2024-02-14 MED ORDER — POLYETHYLENE GLYCOL 3350 17 G PO PACK: 17.0000 g | PACK | Freq: Two times a day (BID) | ORAL | Status: DC

## 2024-02-14 NOTE — Progress Notes (Signed)
 4 Days Post-Op Procedure(s) (LRB): UPPER VAGINECTOMY, LEFT PARAMETRECTOMY, LEFT URETEROLYSIS, OVARIAN TRANSPOSITION (N/A) CYSTOSCOPY WITH INDOCYANINE GREEN IMAGING (ICG) (Bilateral)  Subjective: Patient reports that she is overall doing well.  Doing a little bit better each day.  Was able to tolerate small amount of food yesterday without nausea or vomiting.  Just had a full omelette this morning and some potatoes without issue.  Continues to pass gas.  Felt that the MiraLAX  helped a lot and may want to increase this to twice a day.  Had 1 small watery bowel movement with passing gas yesterday.  No soft or formed bowel movement.  Feeling ready to go home.  Ambulating without issue.  Objective: Vital signs in last 24 hours: Temp:  [98.1 F (36.7 C)-98.7 F (37.1 C)] 98.1 F (36.7 C) (10/26 0700) Pulse Rate:  [78-80] 80 (10/26 0700) Resp:  [17-18] 18 (10/26 0700) BP: (122-127)/(76-80) 122/80 (10/26 0700) SpO2:  [100 %] 100 % (10/26 0700) Weight:  [219 lb (99.3 kg)] 219 lb (99.3 kg) (10/26 0900) Last BM Date : 02/13/24  Intake/Output from previous day: 10/25 0701 - 10/26 0700 In: 1560 [P.O.:1560] Out: 2000 [Urine:2000]  Physical Examination: General: alert, cooperative, and no distress Resp: clear to auscultation bilaterally Cardio: regular rate and rhythm, S1, S2 normal, no murmur, click, rub or gallop GI: incision: lap sites to the abdomen intact with dermabond without drainage and abdomen is soft, nondistended, nontympanitic, improved bowel sounds today, normal Extremities: extremities normal, atraumatic, no cyanosis or edema Foley in place with clear, yellow urine  Labs:      Assessment: 38 y.o. s/p Procedure(s): UPPER VAGINECTOMY, LEFT PARAMETRECTOMY, LEFT URETEROLYSIS, OVARIAN TRANSPOSITION CYSTOSCOPY WITH INDOCYANINE GREEN IMAGING (ICG): stable Pain: Continued slow improvement in pain.  Doing well with oxycodone  5 to 10 mg.  Heme: Hemoglobin stable yesterday.  CBC  discontinued.  ID: Afebrile.  White blood cell count stable yesterday and normal.  No signs or symptoms of infection.  CV: BP and HR stable.  Tachycardia resolved  GI:  Tolerating po: Tolerating regular diet.  Passing gas.  GU: Foley in place.  Creatinine stable yesterday.  Plan for backfill void trial today.  FEN: Follow-up labs this morning.  Potassium orally repleted yesterday.  Prophylaxis: SCDs and lovenox  ordered.  Plan: Backfill void trial this morning. Given improvement in pain and p.o. intake.  Patient likely ready for discharge this afternoon. Will follow-up void trial and reevaluate with lunch to ensure safe for discharge to home today. Has postop meds available at home including oxycodone  and Senokot. Patient can increase MiraLAX  to twice daily.  She has this available at home.   LOS: 2 days    Vanessa Alesi 02/14/2024, 10:00 AM

## 2024-02-14 NOTE — Progress Notes (Signed)
 Foley inserted per order d/t retention. Instructed and demonstrated care and emptying for pt. Pt voices understanding and ability. Denies leg bag as pt plans on staying at home and going to appointments.  Reviewed post-op discharge instructions with pt. All questions answered.

## 2024-02-14 NOTE — Discharge Summary (Signed)
 Physician Discharge Summary  Patient ID: Holly Brown MRN: [id number] DOB/AGE: 30-May-1985 38 y.o.  Admit date: 02/10/2024 Discharge date: 02/14/2024  Admission Diagnoses: Cervical cancer [facility])  Discharge Diagnoses:  Principal Problem:   Cervical cancer [facility])   Discharged Condition:  The patient is in good condition and stable for discharge.   Hospital Course: On 02/10/2024, the patient underwent the following: Procedure(s): UPPER VAGINECTOMY, LEFT PARAMETRECTOMY, LEFT URETEROLYSIS, OVARIAN TRANSPOSITION CYSTOSCOPY WITH INDOCYANINE GREEN IMAGING (ICG).  The postoperative course was notable for slow return of bowel function with pain secondary to gas pains.  Once patient was able to start passing gas her pain improved.  Prior to discharge she underwent a formal backfill void trial with only voided with placed. Given this, a foley was replaced and she will return to clinic for a repeat void trial this week.  She was discharged to home on postoperative day 4 tolerating a regular diet, passing gas and ambulating without issue.  Consults: none  Significant Diagnostic Studies: none  Treatments: surgery: as above  Discharge Exam: Blood pressure 122/80, pulse 80, temperature 98.1 F (36.7 C), temperature source Oral, resp. rate 18, height 5' 10.5 (1.791 m), weight 219 lb (99.3 kg), SpO2 100%. General: alert, cooperative, and no distress Resp: clear to auscultation bilaterally Cardio: regular rate and rhythm, S1, S2 normal, no murmur, click, rub or gallop GI: incision: lap sites to the abdomen intact with dermabond without drainage and abdomen is soft, nondistended, nontympanitic, improved bowel sounds today, normal Extremities: extremities normal, atraumatic, no cyanosis or edema Foley in place with clear, yellow urine  Disposition:  There are no questions and answers to display.         Allergies as of 02/14/2024       Reactions   Keflex  [cephalexin ] Hives,  Itching   Denies difficulty breathing        Medication List     STOP taking these medications    bisacodyl  5 MG EC tablet Generic drug: bisacodyl    metroNIDAZOLE  500 MG tablet Commonly known as: FLAGYL    neomycin 500 MG tablet Commonly known as: MYCIFRADIN       TAKE these medications    acetaminophen  500 MG tablet Commonly known as: TYLENOL  Take 1,000 mg by mouth every 6 (six) hours as needed for moderate pain (pain score 4-6), fever or headache.   diclofenac  75 MG EC tablet Commonly known as: VOLTAREN  Take 1 tablet (75 mg total) by mouth 2 (two) times daily.   dicyclomine  10 MG capsule Commonly known as: BENTYL  Take 1 capsule (10 mg total) by mouth 3 (three) times daily as needed for spasms.   omeprazole  20 MG tablet Commonly known as: PRILOSEC OTC Take 20 mg by mouth daily.   ondansetron  4 MG tablet Commonly known as: Zofran  Take 1 tablet (4 mg total) by mouth every 8 (eight) hours as needed for nausea or vomiting. What changed: Another medication with the same name was removed. Continue taking this medication, and follow the directions you see here.   oxyCODONE  5 MG immediate release tablet Commonly known as: Oxy IR/ROXICODONE  Take 1 tablet (5 mg total) by mouth every 4 (four) hours as needed for severe pain (pain score 7-10). For AFTER surgery only, do not take and drive   polyethylene glycol powder 17 GM/SCOOP powder Commonly known as: GLYCOLAX /MIRALAX  Take 233.75 g by mouth once for 1 dose Take according to your procedure prep instructions.   promethazine  25 MG tablet Commonly known as: PHENERGAN  Take  1 tablet (25 mg total) by mouth every 6 (six) hours as needed for nausea or vomiting.        Follow-up Information     [name] Comer SAUNDERS, MD Follow up on 02/17/2024.   Specialty: Gynecologic Oncology Why: Plan on having a phone call on 02/17/24 with Dr. [name]. IN PERSON visit will be on 03/11/24 at 3:45pm at the [facility]. Contact  information: [address] [place] [address] [phone]         [facility] - A Dept Of [place]. [facility] Follow up on 02/16/2024.   Specialty: Gynecologic Oncology Why: at 11 am at the [facility] to have a voiding trial to see if foley can be removed. Contact information: [address] [place]  [address] [phone]                Greater than thirty minutes were spend for face to face discharge instructions and discharge orders/summary in EPIC.   SignedBETHA ELDONNA MAYS 02/14/2024, 10:26 AM

## 2024-02-15 ENCOUNTER — Telehealth: Payer: Self-pay | Admitting: *Deleted

## 2024-02-15 ENCOUNTER — Telehealth: Payer: Self-pay | Admitting: Oncology

## 2024-02-15 ENCOUNTER — Other Ambulatory Visit: Payer: Self-pay | Admitting: Oncology

## 2024-02-15 DIAGNOSIS — C539 Malignant neoplasm of cervix uteri, unspecified: Secondary | ICD-10-CM

## 2024-02-15 NOTE — Telephone Encounter (Signed)
 Spoke with Holly Brown this morning. She states she is eating, drinking and foley catheter is draining well. She has  had a BM and is passing gas. She is taking senokot as prescribed and encouraged her to drink plenty of water. If the senokot-s causes too much gas, Dr. Eldonna states you can stop taking it and just use miralax . She denies fever or chills. Incisions are dry and intact. She rates her pain 6/10. Her pain is controlled with oxycodone  and tylenol .     Instructed to call office with any fever, chills, purulent drainage, uncontrolled pain or any other questions or concerns. Patient verbalizes understanding.   Pt aware of post op appointments as well as the office number [phone] and after hours number [phone] to call if she has any questions or concerns

## 2024-02-15 NOTE — Progress Notes (Signed)
 Gynecologic Oncology Multi-Disciplinary Disposition Conference Note  Date of the Conference: 02/15/2024  Patient Name: Holly Brown  Referring Provider: Dr. Lorence Primary GYN Oncologist: Dr. 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 Disposition is to pelvic radiation with consideration of sensitizing cisplatin.   This Multidisciplinary conference took place involving physicians from Gynecologic Oncology, Medical Oncology, Radiation Oncology, Pathology, Radiology along with the Gynecologic Oncology Nurse Practitioner and Gynecologic Oncology Nurse Navigator.  Comprehensive assessment of the patient's malignancy, staging, need for surgery, chemotherapy, radiation therapy, and need for further testing were reviewed. Supportive measures, both inpatient and following discharge were also discussed. The recommended plan of care is documented. Greater than 35 minutes were spent correlating and coordinating this patient's care.

## 2024-02-15 NOTE — Telephone Encounter (Signed)
 Called Holly Brown and scheduled a new patient appointment on 02/18/24 at 2:00 with Dr. Lonn.  She asked her appointment with Gyn Onc can be moved to Thursday as well.  Appointment for the void trial has been rescheduled to 1:00 on 02/18/2024.  Also let her know that radiation oncology will be calling with an appointment to see Dr. Shannon.  She verbalized understanding and agreement.

## 2024-02-16 ENCOUNTER — Inpatient Hospital Stay

## 2024-02-17 ENCOUNTER — Telehealth: Payer: Self-pay | Admitting: *Deleted

## 2024-02-17 ENCOUNTER — Inpatient Hospital Stay (HOSPITAL_BASED_OUTPATIENT_CLINIC_OR_DEPARTMENT_OTHER): Admitting: Gynecologic Oncology

## 2024-02-17 ENCOUNTER — Encounter: Admitting: Physical Therapy

## 2024-02-17 DIAGNOSIS — Z7189 Other specified counseling: Secondary | ICD-10-CM

## 2024-02-17 DIAGNOSIS — Z9079 Acquired absence of other genital organ(s): Secondary | ICD-10-CM

## 2024-02-17 DIAGNOSIS — C539 Malignant neoplasm of cervix uteri, unspecified: Secondary | ICD-10-CM

## 2024-02-17 NOTE — Telephone Encounter (Signed)
 Spoke with Holly Brown after she left a message for the office.  Pt reports her urine in the foley bag looks cloudy with sediment. Pt denies fever, chills and pain. Advised patient we will take a sample prior to removing her foley tomorrow for her void trial.   Also advised patient if she develops any symptoms with or without fever prior to her 1 pm appointment tomorrow to call the office back. Pt verbalized understanding and thanked the office for calling.

## 2024-02-17 NOTE — Progress Notes (Unsigned)
 Gynecologic Oncology Telehealth Note: Gyn-Onc  I connected with [name] on 02/18/24 at  6:00 PM EDT by telephone and verified that I am speaking with the correct person using two identifiers.  I discussed the limitations, risks, security and privacy concerns of performing an evaluation and management service by telemedicine and the availability of in-person appointments. I also discussed with the patient that there may be a patient responsible charge related to this service. The patient expressed understanding and agreed to proceed.  Other persons participating in the visit and their role in the encounter: none.  Patient's location: home, [place] Provider's location: [facility], [place]  Reason for Visit: follow-up  Treatment History: Patient has a long history of abnormal uterine bleeding refractory to medical management.  History also notable for cervical dysplasia.  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  Repeat Pap in 02/2022 was again HSIL, HPV 16+.  Patient was also found to be positive for chlamydia and trichomoniasis at that time.  Cold knife cone biopsy was performed on 03/18/2022 revealing CIN-2-3 with extension into underlying endocervical glands, negative for invasive carcinoma.  Endocervical margin was positive for high-grade dysplasia.  STD testing in March 2024 again showed patient positive for chlamydia and trichomoniasis.   More recently, Pap test on 10/13/2022 was HSIL, high risk HPV positive.  2 cervical biopsies both showed CIN-1, ECC showed CIN-2-3.   In the setting of abnormal uterine bleeding, the patient was offered definitive surgical management and on 12/16/2022 underwent TVH lateral salpingectomy.  This included a 10-week sized uterus with normal tubes and ovaries.  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  Lymphovascular invasion  not identified.  All margins were negative for invasive carcinoma and high-grade dysplasia.   02/11/23: Robotic bilateral pelvic lymphadenectomy  Area noted on her vaginal cuff in June 2025.  Biopsy performed and showed fragments of squamous mucosa with focal small area of atypical cells.  On exam in early October 2025, this area was again noted and visually appeared concerning for recurrence.  Biopsy at that time showed at least squamous cell carcinoma in situ, could not rule out early invasion.  When options were discussed, and given no prior radiation, decision made to proceed with excision via upper vaginectomy. 02/10/24: Robotic-assisted upper vaginectomy, left parametrectomy, left ureterolysis, bilateral ovarian transposition Cystoscopy with ICG ureteral injection bilaterally, left ureteral stent placement (Dr. Cam)  Interval History: Cloudy urine starting yesterday. Some bladder spasms. Pain otherwise improving, moving a lot. Gas pain much better. Bowels moving well (normal today). Denies vaginal bleeding.  Past Medical/Surgical History: Past Medical History:  Diagnosis Date   Anxiety    Cancer (HCC)    cervical cancer   Depression    Fatty liver    GERD (gastroesophageal reflux disease)    History of hiatal hernia    History of UTI    HSV (herpes simplex virus) infection    Migraines    Peripheral vascular disease    Post-operative nausea and vomiting 12/19/2022   Seizures (HCC)    Jan.2016 - only had one seizure, none since   Sleep apnea    no CPAP   Vaginal Pap smear, abnormal     Past Surgical History:  Procedure Laterality Date   BIOPSY  12/24/2022   Procedure: BIOPSY;  Surgeon: Leigh Elspeth SQUIBB, MD;  Location: [facility]  ENDOSCOPY;  Service: Gastroenterology;;   BONE BIOPSY  12/12/2023   Procedure: BIOPSY, GI;  Surgeon: Federico Rosario BROCKS, MD;  Location: [facility] ENDOSCOPY;  Service: Gastroenterology;;   COLONOSCOPY N/A 12/12/2023   Procedure: COLONOSCOPY;   Surgeon: Federico Rosario BROCKS, MD;  Location[facility] ENDOSCOPY;  Service: Gastroenterology;  Laterality: N/A;   CYSTOSCOPY WITH INDOCYANINE GREEN IMAGING (ICG) Bilateral 02/10/2024   Procedure: CYSTOSCOPY WITH INDOCYANINE GREEN IMAGING (ICG);  Surgeon: Cam Morene ORN, MD;  Location: [facility] ORS;  Service: Urology;  Laterality: Bilateral;   DILATION AND CURETTAGE OF UTERUS     ESOPHAGOGASTRODUODENOSCOPY (EGD) WITH PROPOFOL  N/A 12/24/2022   Procedure: ESOPHAGOGASTRODUODENOSCOPY (EGD) WITH PROPOFOL ;  Surgeon: Leigh Elspeth SQUIBB, MD;  Location: [facility] ENDOSCOPY;  Service: Gastroenterology;  Laterality: N/A;   LAPAROSCOPIC TUBAL LIGATION Bilateral 12/08/2018   Procedure: LAPAROSCOPIC TUBAL LIGATION;  Surgeon: Alger Gong, MD;  Location: [facility];  Service: Gynecology;  Laterality: Bilateral;   LEEP N/A 03/18/2022   Procedure: LOOP ELECTROSURGICAL EXCISION PROCEDURE (LEEP);  Surgeon: Alger Gong, MD;  Location: [facility] OR;  Service: Gynecology;  Laterality: N/A;   MINI-LAPAROTOMY     after hysterectomy   POLYPECTOMY  12/12/2023   Procedure: POLYPECTOMY, INTESTINE;  Surgeon: Federico Rosario BROCKS, MD;  Location: [facility] ENDOSCOPY;  Service: Gastroenterology;;   TUBAL LIGATION     VAGINAL HYSTERECTOMY N/A 12/16/2022   Procedure: TOTAL VAGINAL HYSTERECTOMY, BILATERAL SALPINGECTOMY;  Surgeon: Lorence Ozell CROME, MD;  Location: [facility] OR;  Service: Gynecology;  Laterality: N/A;   VAGINECTOMY, PARTIAL N/A 02/10/2024   Procedure: UPPER VAGINECTOMY, LEFT PARAMETRECTOMY, LEFT URETEROLYSIS, OVARIAN TRANSPOSITION;  Surgeon: [name] Comer SAUNDERS, MD;  Location: [facility] ORS;  Service: Gynecology;  Laterality: N/A;  ROBOTIC  ASSISTED UPPER VAGINECTOMY   WISDOM TOOTH EXTRACTION  04/21/2009    Family History  Problem Relation Age of Onset   Diabetes Mother    Stroke Mother    Colon cancer Father 41   Cancer Father    Hypertension Maternal Grandmother    Diabetes Maternal Grandmother    Hypertension Paternal Grandmother     Esophageal cancer Neg Hx    Liver disease Neg Hx     Social History   Socioeconomic History   Marital status: Single    Spouse name: Not on file   Number of children: 4   Years of education: college   Highest education level: Some college, no degree  Occupational History    Employer: [employer]    Comment: tax inspector  Tobacco Use   Smoking status: Former    Current packs/day: 0.25    Average packs/day: 0.3 packs/day for 12.2 years (3.0 ttl pk-yrs)    Types: Cigarettes    Start date: 12/21/2011    Passive exposure: Never   Smokeless tobacco: Never  Vaping Use   Vaping status: Never Used  Substance and Sexual Activity   Alcohol use: Yes    Comment: ocassionally    Drug use: Not Currently    Types: Marijuana    Comment: Last use was in 2023   Sexual activity: Not Currently    Partners: Male    Birth control/protection: Surgical    Comment: Tubal Ligation  Other Topics Concern   Not on file  Social History Narrative   Lives with her grandmother and her three children   Does not drink caffeine    Social Drivers of Corporate Investment Banker Strain: Low Risk  (10/16/2023)   Overall Financial Resource Strain (CARDIA)    Difficulty of Paying  Living Expenses: Not hard at all  Food Insecurity: No Food Insecurity (02/18/2024)   Hunger Vital Sign    Worried About Running Out of Food in the Last Year: Never true    Ran Out of Food in the Last Year: Never true  Transportation Needs: No Transportation Needs (02/18/2024)   PRAPARE - Administrator, Civil Service (Medical): No    Lack of Transportation (Non-Medical): No  Physical Activity: Sufficiently Active (10/16/2023)   Exercise Vital Sign    Days of Exercise per Week: 4 days    Minutes of Exercise per Session: 120 min  Stress: Stress Concern Present (10/16/2023)   [employer] of Occupational Health - Occupational Stress Questionnaire    Feeling of Stress: To some extent  Social Connections:  Moderately Isolated (10/16/2023)   Social Connection and Isolation Panel    Frequency of Communication with Friends and Family: More than three times a week    Frequency of Social Gatherings with Friends and Family: Patient declined    Attends Religious Services: 1 to 4 times per year    Active Member of [employer] or Organizations: No    Attends Engineer, Structural: Not on file    Marital Status: Never married    Current Medications:  Current Outpatient Medications:    acetaminophen  (TYLENOL ) 500 MG tablet, Take 1,000 mg by mouth every 6 (six) hours as needed for moderate pain (pain score 4-6), fever or headache., Disp: , Rfl:    nitrofurantoin , macrocrystal-monohydrate, (MACROBID ) 100 MG capsule, Take 1 capsule (100 mg total) by mouth 2 (two) times daily., Disp: 10 capsule, Rfl: 0   omeprazole  (PRILOSEC OTC) 20 MG tablet, Take 20 mg by mouth daily., Disp: , Rfl:    oxyCODONE  (OXY IR/ROXICODONE ) 5 MG immediate release tablet, Take 1 tablet (5 mg total) by mouth every 4 (four) hours as needed for severe pain (pain score 7-10). For AFTER surgery only, do not take and drive, Disp: 20 tablet, Rfl: 0   polyethylene glycol powder (GLYCOLAX /MIRALAX ) 17 GM/SCOOP powder, Take 233.75 g by mouth once for 1 dose Take according to your procedure prep instructions., Disp: 238 g, Rfl: 0   promethazine  (PHENERGAN ) 25 MG tablet, Take 1 tablet (25 mg total) by mouth every 6 (six) hours as needed for nausea or vomiting., Disp: 30 tablet, Rfl: 0  Review of Symptoms: Pertinent positives as per HPI.  Physical Exam: Deferred given limitations of phone visit.  Laboratory & Radiologic Studies: A. ROUND LIGAMENT, SOFT TISSUE EXCISION:  Fibromuscular connective tissue consistent with broad ligament.  Negative for malignancy.   B. UPPER VAGINA, PARTIAL VAGINECTOMY:  Invasive squamous cell carcinoma, 2.5 cm (pT1b) with associated high  grade squamous dysplasia.  Carcinoma invades to a depth of 0.9 cm.   Invasive carcinoma focally 2 mm from 3-6 o'clock soft tissue margin.  Lymphovascular invasion.  Focal perineural invasion.  See oncology table.   Assessment & Plan: Holly Brown is a 38 y.o. 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 Initial tumor PDL1 CPS 0%  Patient overall doing well postoperatively.  Had some urinary dysfunction and went home with a catheter.  Has developed symptoms in the last day that are concerning for possible urinary tract infection.  She is being seen for Foley catheter removal in my clinic tomorrow.  Plan for UA and culture.  Discussed continued expectations and restrictions.  Reviewed pathology again from surgery.  Discussed recommendations based on tumor board discussion for adjuvant radiation with sensitizing cisplatin.    I discussed the assessment and treatment plan with the patient. The patient was provided with an opportunity to ask questions and all were answered. The patient agreed with the plan and demonstrated an understanding of the instructions.   The patient was advised to call back or see an in-person evaluation if the symptoms worsen or if the condition fails to improve as anticipated.   10 minutes of total time was spent for this patient encounter, including preparation, phone counseling with the patient and coordination of care, and documentation of the encounter.   Comer Dollar, MD  Division of Gynecologic Oncology  Department of Obstetrics and Gynecology  [facility]

## 2024-02-18 ENCOUNTER — Inpatient Hospital Stay (HOSPITAL_BASED_OUTPATIENT_CLINIC_OR_DEPARTMENT_OTHER): Admitting: Gynecologic Oncology

## 2024-02-18 ENCOUNTER — Encounter: Payer: Self-pay | Admitting: Oncology

## 2024-02-18 ENCOUNTER — Encounter: Payer: Self-pay | Admitting: Hematology and Oncology

## 2024-02-18 ENCOUNTER — Inpatient Hospital Stay (HOSPITAL_BASED_OUTPATIENT_CLINIC_OR_DEPARTMENT_OTHER): Admitting: Hematology and Oncology

## 2024-02-18 ENCOUNTER — Other Ambulatory Visit (HOSPITAL_COMMUNITY): Payer: Self-pay

## 2024-02-18 ENCOUNTER — Inpatient Hospital Stay

## 2024-02-18 ENCOUNTER — Encounter: Payer: Self-pay | Admitting: Gynecologic Oncology

## 2024-02-18 VITALS — BP 123/58 | HR 107 | Temp 99.4°F | Resp 18 | Wt 218.0 lb

## 2024-02-18 DIAGNOSIS — D072 Carcinoma in situ of vagina: Secondary | ICD-10-CM | POA: Diagnosis not present

## 2024-02-18 DIAGNOSIS — R310 Gross hematuria: Secondary | ICD-10-CM

## 2024-02-18 DIAGNOSIS — Z9079 Acquired absence of other genital organ(s): Secondary | ICD-10-CM

## 2024-02-18 DIAGNOSIS — C539 Malignant neoplasm of cervix uteri, unspecified: Secondary | ICD-10-CM

## 2024-02-18 DIAGNOSIS — R8290 Unspecified abnormal findings in urine: Secondary | ICD-10-CM

## 2024-02-18 DIAGNOSIS — Z9889 Other specified postprocedural states: Secondary | ICD-10-CM

## 2024-02-18 DIAGNOSIS — Z8541 Personal history of malignant neoplasm of cervix uteri: Secondary | ICD-10-CM | POA: Diagnosis not present

## 2024-02-18 DIAGNOSIS — Z90710 Acquired absence of both cervix and uterus: Secondary | ICD-10-CM | POA: Diagnosis not present

## 2024-02-18 DIAGNOSIS — Z978 Presence of other specified devices: Secondary | ICD-10-CM

## 2024-02-18 MED ORDER — NITROFURANTOIN MONOHYD MACRO 100 MG PO CAPS
100.0000 mg | ORAL_CAPSULE | Freq: Two times a day (BID) | ORAL | 0 refills | Status: DC
  Filled 2024-02-18: qty 10, 5d supply, fill #0

## 2024-02-18 NOTE — Progress Notes (Signed)
 GYN Oncology Post Operative Follow Up  Holly Brown is a 38 year old female who presents to the office today for a voiding trial and foley removal.   Treatment History: Patient has a long history of abnormal uterine bleeding refractory to medical management.  History also notable for cervical dysplasia.  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  Repeat Pap in 02/2022 was again HSIL, HPV 16+.  Patient was also found to be positive for chlamydia and trichomoniasis at that time.  Cold knife cone biopsy was performed on 03/18/2022 revealing CIN-2-3 with extension into underlying endocervical glands, negative for invasive carcinoma.  Endocervical margin was positive for high-grade dysplasia.  STD testing in March 2024 again showed patient positive for chlamydia and trichomoniasis.   More recently, Pap test on 10/13/2022 was HSIL, high risk HPV positive.  2 cervical biopsies both showed CIN-1, ECC showed CIN-2-3.   In the setting of abnormal uterine bleeding, the patient was offered definitive surgical management and on 12/16/2022 underwent TVH lateral salpingectomy.  This included a 10-week sized uterus with normal tubes and ovaries.  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  Lymphovascular invasion not identified.  All margins were negative for invasive carcinoma and high-grade dysplasia.   02/11/23: Robotic bilateral pelvic lymphadenectomy.  At her visit with Dr. [name] on 01/21/2024, she had a pap smear returning with HSIL, HPV high risk+ and a vaginal biopsy revealing squamous cell carcinoma in situ at least, cannot rule out invasion. Surgical intervention was recommended.  On 02/10/2024, she underwent robotic-assisted upper vaginectomy, left parametrectomy, left ureterolysis, bilateral ovarian transposition by Dr. Comer [name] and cystoscopy with ICG  ureteral injection bilaterally, left ureteral stent placement (stent was ultimately removed at the time of surgery by Dr. [name]) by Dr. Odis Salines. She was discharged home with a foley in place.  Final pathology returning with: A. ROUND LIGAMENT, SOFT TISSUE EXCISION:  Fibromuscular connective tissue consistent with broad ligament.  Negative for malignancy.   B. UPPER VAGINA, PARTIAL VAGINECTOMY:  Invasive squamous cell carcinoma, 2.5 cm (pT1b) with associated high grade squamous dysplasia.  Carcinoma invades to a depth of 0.9 cm.  Invasive carcinoma focally 2 mm from 3-6 o'clock soft tissue margin.  Lymphovascular invasion.  Focal perineural invasion.  See oncology table.   Interval: She presents today to the office for postoperative follow-up including a voiding trial.  She reports overall doing well at home with no fever or chills reported.  Tolerating her diet.  Foley has been draining adequate urine.  On Tuesday, she did note the urine having a cloudy appearance.  This morning she noted blood-tinged urine starting.  She does report lower back pain but states this improves with change in position so she feels this is related to her posture.  She is having normal bowel movements.  No vaginal bleeding or discharge reported.  Exam: Alert, oriented, in no acute distress, grimacing intermittently related to bladder spasms when trying to obtain urine sample. Breathing unlabored on room air Mildly tachy on arrival vital signs. Foley with blood tinged urine with cloudy sediment throughout. Moderate tan sediment noted in the catheter tubing.   Patient voided 50 cc then 45 minutes later 100 cc. Dr. [name] updated.   Assessment/Plan: 38 year old female s/p robotic-assisted upper vaginectomy, left parametrectomy, left ureterolysis, bilateral ovarian transposition by Dr.  [name] and cystoscopy with ICG ureteral injection bilaterally, left ureteral stent placement by Dr. Odis Salines with recurrent  squamous cell carcinoma. Urine sample sent for culture today. Given the moderate amount of cloudy sediment in the foley catheter tubing, plan for active voiding trial and instillation of normal saline in the bladder aborted to prevent introducing material back into the bladder. Discussed with Dr. [name]. Foley removed without difficulty today. Patient was able to void 150 cc.   After discussion with options of in and out cath to check post void residual vs going home with timed voids every 2-3 hours and calling for issues, the patient elected for going home to monitor urine. Supplies given. Reportable signs and symptoms reviewed. Contact information given as well.

## 2024-02-18 NOTE — Patient Instructions (Addendum)
 Today we sent a urine sample from your catheter for culture. Given the appearance of the urine, we will plan to start treatment with macrobid  100 mg twice daily for 5 days. We will contact you when the culture results return and let you know if we need to change antibiotics based on the results.   If you develop a fever, difficulty urinating, only urinating small amounts, severe back pain, or any new symptoms, please call the office at [phone] or after hours at [phone].\  Continue with your post-op restrictions of no heavy lifting/pushing/pulling/straining for the full 6 weeks, nothing in the vagina for 12 weeks.  Plan on doing timed voids at home meaning go and try to void every 2-3 hours when awake and write down the amount of urine. If unable to void, you will need to call the after hours number or go to the ER to have the catheter replaced. If only voiding small amounts, plan to call the office in the am with an update and to see if you need to be seen.

## 2024-02-18 NOTE — Progress Notes (Signed)
 Requested PD-L1 on accession [id number] with [facility] per Dr. Lonn.

## 2024-02-18 NOTE — Progress Notes (Signed)
 [facility] CONSULT NOTE  Patient Care Team: [name], Holly BROCKS, NP as PCP - General (Family Medicine) Holly Comer SAUNDERS, MD as Consulting Physician (Gynecologic Oncology) Holly Darice SAUNDERS, RN as Oncology Nurse Navigator (Oncology)  ASSESSMENT & PLAN:  Cervical cancer [facility]) The patient has abnormal Pap smear dated back to 2017.  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  Subsequent follow-up in October confirmed likelihood of recurrent disease.  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  There is present of lymphovascular invasion and perineural invasion  She is still recovering from surgery I will add additional molecular testing to her recent pathology for PD-L1 testing We discussed the role of adjuvant treatment She has appointment to see radiation oncologist next week If her PD-L1 test came back positive, she would qualify for concurrent chemoradiation therapy with cisplatin and pembrolizumab Her previous PD-L1 test from 2024 was negative  For today, I recommend the patient to focus on healing We discussed port placement I will bring her back when she is ready to start treatment next month; hopefully, we will have results of the PD-L1 back by then  We discussed the role of chemotherapy. The intent is of curative intent.  We discussed some of the risks, benefits, side-effects of cisplatin  Some of the short term side-effects included, though not limited to, including weight loss, life threatening infections, risk of allergic reactions, need for transfusions of blood products, nausea, vomiting, change in bowel habits, loss of hair,  admission to hospital for various reasons, and risks of death.   Long term side-effects are also discussed including risks of infertility, permanent damage to nerve function, hearing loss, chronic fatigue, kidney damage with possibility needing hemodialysis, and rare secondary malignancy including bone marrow disorders.  The patient is aware that the response rates discussed earlier is not guaranteed.  After a long discussion, patient made an informed decision to proceed with the prescribed plan of care.   Patient education material was dispensed.   Orders Placed This Encounter  Procedures   IR IMAGING GUIDED PORT INSERTION    Standing Status:   Future    Expected Date:   02/25/2024    Expiration Date:   02/17/2025    Reason for Exam (SYMPTOM  OR DIAGNOSIS REQUIRED):   need port for chemo, ok for 2 weeks    Preferred Imaging Location?:   [facility]    The total time spent in the appointment was 60 minutes encounter with patients including review of chart and various tests results, discussions about plan of care and coordination of care plan   All questions were answered. The patient knows to call the clinic with any problems, questions or concerns. No barriers to learning was detected.  Holly Bedford, MD 10/30/20253:11 PM  CHIEF COMPLAINTS/PURPOSE OF CONSULTATION:  Recurrent cervical cancer, for adjuvant treatment  HISTORY OF PRESENTING ILLNESS:  Holly Brown 38 y.o. female is here because of recent diagnosis of recurrent cervical cancer She is here accompanied by her sister She works as a scientist, forensic She lives with her 3 children She does not smoke or drink alcohol I reviewed her records extensively  I have reviewed her chart and materials  related to her cancer extensively and collaborated history with the patient. Summary of oncologic history is as follows: Oncology History Overview Note  PD-L1 0% from 12/16/22   Cervical cancer (HCC)  12/18/2015  Pathology Results   Endocervix, curettage -KOILOCYTIC ATYPIA CONSISTENT WITH HUMAN PAPILLOMAVIRUS (HPV) EFFECT. -BENIGN ENDOCERVICAL GLANDS. 2. Cervix, biopsy -CERVICAL TRANSFORMATION ZONE MUCOSA WITH CIN-I/II (MILD - MODERATE SQUAMOUS DYSPLASIA; LOW - HIGH GRADE SQUAMOUS INTRAEPITHELIAL LESION). -INVOLVING ENDOCERVICAL GLANDS.   11/08/2020 Pathology Results   HIGH RISK HPV ([place]): Positive Abnormal  YPV 16 ([place]): Positive Abnormal  HPV 18 / 45 ([place]): Negative ADEQUACY: Satisfactory for evaluation; transformation zone component PRESENT. DIAGNOSIS: - High grade squamous intraepithelial lesion (HSIL) Abnormal  MICROORGANISMS - CYTOLOGY: Trichomonas vaginalis present COMMENT (MOLECULAR): Normal Reference Range HPV - Negative COMMENT (MOLECULAR): Normal Reference Range HPV 16 18 45 -Negative       05/21/2021 Pathology Results   SURGICAL PATHOLOGY  CASE: [id number]  PATIENT: Kourtlynn Tennison  Surgical Pathology Report  Clinical History: HGSIL (cm)   FINAL MICROSCOPIC DIAGNOSIS:   A. ENDOCERVIX, CURETTAGE:  -  High-grade squamous intraepithelial lesion (CIN 2-3; moderate to severe dysplasia)   B. CERVIX, BIOPSY:  -  High-grade squamous intraepithelial lesion (CIN 2-3; moderate to severe dysplasia), extending into endocervical glands     03/18/2022 Pathology Results   SURGICAL PATHOLOGY  CASE: [id number]  PATIENT: Holly Brown  Surgical Pathology Report   Clinical History: HGSIL (cm)   FINAL MICROSCOPIC DIAGNOSIS:   A. CERVIX, CONIZATION:  - High-grade squamous intraepithelial lesion (CIN2-3, high grade dysplasia) with extension into underlying endocervical glands  - Negative for invasive carcinoma  - The endocervical margin is positive for high-grade dysplasia    03/18/2022 Surgery   PREOPERATIVE DIAGNOSIS: HGSIL/CIN 2 on pap smear and colposcopy POSTOPERATIVE DIAGNOSIS: The same. PROCEDURE:     Cervical cone biopsy SURGEON:  Dr. Winton Felt   11/19/2022 Pathology Results   SURGICAL PATHOLOGY  CASE: [phone]  PATIENT: Holly Brown  Surgical Pathology Report   Clinical History: HGSIL, HPV + (cm)   FINAL MICROSCOPIC DIAGNOSIS:   A. CERVIX, 12 AND 6 O'CLOCK, BIOPSY:  - Low-grade squamous intraepithelial lesion (L SIL/CIN-1)   B. ENDOCERVIX, CURETTAGE:  - High-grade squamous intraepithelial lesion (HSIL/CIN-1 2-3).  See comment.   COMMENT:   - Immunohistochemical stain p16 shows strong diffuse staining consistent with high-grade squamous intraepithelial lesion.  However distinction between CIN-2 and CIN-3 is limited by unoriented fragments in the biopsy.    12/16/2022 Pathology Results   SURGICAL PATHOLOGY  CASE: [id number]  PATIENT: Holly Brown  Surgical Pathology Report   Clinical History: AUB (cm)   FINAL MICROSCOPIC DIAGNOSIS:   A. UTERUS AND CERVIX, WITH BILATERAL FALLOPIAN TUBES, HYSTERECTOMY:  - Invasive moderately differentiated squamous cell carcinoma, see  comment  - Carcinoma invades for a depth of about 0.5 cm  - No evidence of lymphovascular invasion  - Resection margins are negative for carcinoma  - Uterus with benign weakly proliferative endometrium  - Adenomyosis  - Benign unremarkable bilateral fallopian tubes  - See oncology table     12/16/2022 Surgery   PROCEDURE DATE: 12/16/2022   PREOPERATIVE DIAGNOSIS:  Symptomatic fibroids, menorrhagia POSTOPERATIVE DIAGNOSIS:  Symptomatic fibroids, menorrhagia SURGEON:   Ozell Cowman, M.D. CHRISTOPERBETHA RONAL Buddle, M.D.   An experienced assistant was required given the standard of surgical care given the complexity of the case.  This assistant was needed for exposure, dissection, suctioning, retraction, instrument exchange, and for overall help during the  procedure.     OPERATION:  Total Vaginal hysterectomy with bilateral salpingectomy ANESTHESIA:  General endotracheal.   12/29/2022 Initial Diagnosis   Cervical cancer (HCC)    02/11/2023 Pathology Results   SURGICAL PATHOLOGY  CASE: [id number]  PATIENT: Holly Brown  Surgical Pathology Report   Clinical History: cervical cancer   FINAL MICROSCOPIC DIAGNOSIS:   A. RIGHT PELVIC LYMPH NODE, EXCISION:  Four benign lymph nodes, negative for carcinoma (0/4)   B. LEFT PELVIC LYMPH NODE, EXCISION:  Five benign lymph nodes, negative for carcinoma (0/5)    02/12/2023 Surgery   OPERATIVE NOTE  Pre-operative Diagnosis: Stage IB1 grade 2 SCC of the cervix diagnosed after recent hysterectomy   Post-operative Diagnosis: same, pelvic adhesions   Operation: Robotic-assisted laparoscopic bilateral pelvic lymphadenectomy   Surgeon: Holly Crank MD     Operative Findings: On EUA, some thickening of the mid and left portion of the cuff, likely related to recent surgery and postoperative hematoma.  No nodular areas.  On intra-abdominal entry, normal upper abdominal survey.  No ascites.  Omentum with filmy adhesions to the vaginal cuff.  Bilateral ovaries adherent to the cuff with rectum pulled up posteriorly, also adherent to the ovaries and the posterior aspect of the cuff.  No significant hematoma noted.  No appreciable adenopathy.   11/01/2023 Imaging   MRI pelvis 1. Nodular focus of enhancing slightly T2 hyperintense soft tissue along the left side of the vaginal cuff which restricts diffusion measuring 16 x 13 mm, nonspecific but suspicious for local recurrence. Suggest further evaluation with direct visualization and tissue sampling. 2. Intrinsically T1 hyperintense left ovarian lesion measuring 2.4 cm demonstrates internal layering complexity adherent to the dependent wall favored a hemorrhagic cyst with adherent clot. Suggest follow-up pelvic ultrasound in 6-12 weeks to ensure resolution. 3. Mild persistent wall thickening of visualized portions of colon, suggestive of colitis. 4. Symmetric wall thickening of a nondistended urinary bladder, which may be  due to underdistention or cystitis.   11/16/2023 PET scan    No areas of abnormal radiotracer uptake at this time suggest soft tissue or lymph node metastatic disease. Specifically the nodular focus along the left side of the vaginal cuff on the previous MRI does not clearly show abnormal radiotracer uptake today. Please correlate with clinical findings and direct visualization.   There is significant uptake within the previously described complex left-sided ovarian lesion. Again this could be a functional cyst although with this level uptake would recommend short follow up ultrasound to confirm resolution in 6 weeks.   Scattered brown fat uptake.   Mild uptake with opacity along left maxillary sinus. Please correlate for any specific symptoms and history.   01/21/2024 Pathology Results   HIGH RISK HPV ([place]): Positive Abnormal  YPV 16 ([place]): Positive Abnormal  HPV 18 / 45 (): Negative ADEQUACY: Satisfactory for evaluation; transformation zone component ABSENT. DIAGNOSIS: - High grade squamous intraepithelial lesion (HSIL) Abnormal  COMMENT - CYTOLOGY: There are a few cells suggesting a possible higher grade lesion. COMMENT (MOLECULAR): Normal Reference Range HPV - Negative COMMENT (MOLECULAR): Normal Reference Range HPV 16- Negative COMMENT (MOLECULAR): Normal Reference Range HPV 16 18 45 -Negative   01/28/2024 Imaging   Ct abdomen and pelvis 1. Diffuse colonic wall thickening likely representing infectious or inflammatory colitis. Similar appearance to previous study. No bowel obstruction. 2. Mild diffuse fatty infiltration of the liver.     02/08/2024 PET scan   1. Mild asymmetric hypermetabolism along the left vaginal cuff (SUV  max 4.6) corresponding to soft tissue thickening on prior pelvic MRI. 2. No hypermetabolic pelvic lymphadenopathy. 3. No evidence of extra-pelvic nodal, visceral, or skeletal metastasis.   02/08/2024 Imaging   MRI pelvis 1.  Increased size of the T2 hyperintense asymmetric nodular focus along the left vaginal cuff now measuring 2.8 x 1.3 cm which demonstrates enhancement with increased DWI signal and reduced ADC signal. Findings are compatible with given history of suspected recurrence on biopsy. 2. No evidence of pelvic adenopathy.   02/10/2024 Pathology Results   SURGICAL PATHOLOGY  CASE: [id number]  PATIENT: Holly Brown  Surgical Pathology Report   Clinical History: Squamous cell carcinoma in situ at least on vaginal  biopsy, H/O cervical cancer   FINAL MICROSCOPIC DIAGNOSIS:   A. ROUND LIGAMENT, SOFT TISSUE EXCISION:  Fibromuscular connective tissue consistent with broad ligament.  Negative for malignancy.  B. UPPER VAGINA, PARTIAL VAGINECTOMY: Invasive squamous cell carcinoma, 2.5 cm (pT1b) with associated high grade squamous dysplasia. Carcinoma invades to a depth of 0.9 cm. Invasive carcinoma focally 2 mm from 3-6 o'clock soft tissue margin. Lymphovascular invasion. Focal perineural invasion. See oncology table.  ONCOLOGY TABLE: VAGINA, CARCINOMA:  Resection Procedure: Patrial vaginectomy Tumor Site: Vagina Tumor Size: 25 x 17 mm Histologic Type: Squamous cell carcinoma Histologic Grade: Moderately differentiated Sites Involved by Direct Tumor Extension: Not applicable Lymphovascular Invasion: Present Margins: All margins negative for invasive carcinoma, closest margin is 2 mm from the 3-6 o'clock soft tissue margin      Margins Involved by Invasive Carcinoma: Not applicable      Margins Status for HSIL or AIS: Negative Regional Lymph Nodes: No lymph nodes submitted Distant Metastasis:      Distant Site(s) Involved: Not applicable Pathologic Stage Classification (pTNM, AJCC 8th Edition): pT1b, pN Not assigned Ancillary Studies: Can be performed upon request Representative Tumor Block: [id number]    02/10/2024 Surgery   OPERATIVE NOTE  Pre-operative Diagnosis: Suspected  recurrent cervical cancer with at least carcinoma in situ on recent vaginal cuff biopsy   Post-operative Diagnosis: same, retroperitoneal fibrosis   Operation: Robotic-assisted upper vaginectomy, left parametrectomy, left ureterolysis, bilateral ovarian transposition Cystoscopy with ICG ureteral injection bilaterally, left ureteral stent placement (Dr. Cam)   Surgeon: Holly Crank MD    Operative Findings: On EUA, firm 2 cm area of tissue at the left aspect of the vaginal cuff.  No distinct parametrial involvement.  Rectum feels somewhat tethered.  On intra-abdominal entry, normal upper abdominal survey.  Normal small and large bowel.  Aspect of left omentum adherent to the pelvic brim on the left.  Normal-appearing bilateral ovaries with possible fallopian tube remnant versus round ligament remnant attached distally.  Fullness appreciated at the left vaginal cuff.  With manipulation of the vagina using any sizer, the left lateral parametria appeared somewhat full but unclear if tumor present.  This area was ultimately found to be separate from and not causing compression of her left ureter. Given some retroperitoneal fibrosis and desire to achieve negative margin, left ureterolysis performed down to the level of the bladder to mobilize the left parametrium and paravaginal tissue. Prior to colpotomy, a 2-0 STRATAFIX and a 2-0 V-Loc were used to create a cerclage of sorts circumferentially distal to the vaginal cuff lesion ensuring at least a 1 cm margin circumferentially.  Colpotomy was then made inferior to this cerclage using a KOH ring within the vagina as a guide.  Once the vaginal apex was removed from the vagina, cerclage was removed.  Closest margin was the  left lateral margin at 1 cm.  The posterior margin was 2 cm and anteriorly was 1.5 cm.  Laterally to the right there was significant margin of normal tissue.   Bubble est performed at the end of the surgery was negative for evidence of  leak.   Ovaries were pexed out of the pelvis in the event that patient requires pelvic radiation.  Radiopaque clips were used to define the distal margin of each ovary.     02/18/2024 Cancer Staging   Staging form: Cervix Uteri, AJCC Version 9 - Clinical: Stage IIIA (rcT3a, rcN0, rcM0) - Signed by Lonn Hicks, MD on 02/18/2024 Stage prefix: Recurrence     MEDICAL HISTORY:  Past Medical History:  Diagnosis Date   Anxiety    Cancer (HCC)    cervical cancer   Depression    Fatty liver    GERD (gastroesophageal reflux disease)    History of hiatal hernia    History of UTI    HSV (herpes simplex virus) infection    Migraines    Peripheral vascular disease    Post-operative nausea and vomiting 12/19/2022   Seizures (HCC)    Jan.2016 - only had one seizure, none since   Sleep apnea    no CPAP   Vaginal Pap smear, abnormal     SURGICAL HISTORY: Past Surgical History:  Procedure Laterality Date   BIOPSY  12/24/2022   Procedure: BIOPSY;  Surgeon: Leigh Elspeth SQUIBB, MD;  Location: [facility] ENDOSCOPY;  Service: Gastroenterology;;   BONE BIOPSY  12/12/2023   Procedure: BIOPSY, GI;  Surgeon: Federico Rosario BROCKS, MD;  Location: [facility] ENDOSCOPY;  Service: Gastroenterology;;   COLONOSCOPY N/A 12/12/2023   Procedure: COLONOSCOPY;  Surgeon: Federico Rosario BROCKS, MD;  Location: [facility] ENDOSCOPY;  Service: Gastroenterology;  Laterality: N/A;   CYSTOSCOPY WITH INDOCYANINE GREEN IMAGING (ICG) Bilateral 02/10/2024   Procedure: CYSTOSCOPY WITH INDOCYANINE GREEN IMAGING (ICG);  Surgeon: Cam Morene ORN, MD;  Location: [facility] ORS;  Service: Urology;  Laterality: Bilateral;   DILATION AND CURETTAGE OF UTERUS     ESOPHAGOGASTRODUODENOSCOPY (EGD) WITH PROPOFOL  N/A 12/24/2022   Procedure: ESOPHAGOGASTRODUODENOSCOPY (EGD) WITH PROPOFOL ;  Surgeon: Leigh Elspeth SQUIBB, MD;  Location: [facility] ENDOSCOPY;  Service: Gastroenterology;  Laterality: N/A;   LAPAROSCOPIC TUBAL LIGATION Bilateral 12/08/2018   Procedure: LAPAROSCOPIC  TUBAL LIGATION;  Surgeon: Alger Gong, MD;  Location: [facility];  Service: Gynecology;  Laterality: Bilateral;   LEEP N/A 03/18/2022   Procedure: LOOP ELECTROSURGICAL EXCISION PROCEDURE (LEEP);  Surgeon: Alger Gong, MD;  Location: [facility] OR;  Service: Gynecology;  Laterality: N/A;   MINI-LAPAROTOMY     after hysterectomy   POLYPECTOMY  12/12/2023   Procedure: POLYPECTOMY, INTESTINE;  Surgeon: Federico Rosario BROCKS, MD;  Location: [facility] ENDOSCOPY;  Service: Gastroenterology;;   TUBAL LIGATION     VAGINAL HYSTERECTOMY N/A 12/16/2022   Procedure: TOTAL VAGINAL HYSTERECTOMY, BILATERAL SALPINGECTOMY;  Surgeon: Lorence Ozell CROME, MD;  Location: [facility] OR;  Service: Gynecology;  Laterality: N/A;   VAGINECTOMY, PARTIAL N/A 02/10/2024   Procedure: UPPER VAGINECTOMY, LEFT PARAMETRECTOMY, LEFT URETEROLYSIS, OVARIAN TRANSPOSITION;  Surgeon: Holly Comer SAUNDERS, MD;  Location: [facility] ORS;  Service: Gynecology;  Laterality: N/A;  ROBOTIC  ASSISTED UPPER VAGINECTOMY   WISDOM TOOTH EXTRACTION  04/21/2009    SOCIAL HISTORY: Social History   Socioeconomic History   Marital status: Single    Spouse name: Not on file   Number of children: 4   Years of education: college   Highest education level: Some college, no degree  Occupational History    Employer: [employer]    Comment: tax inspector  Tobacco Use   Smoking status: Former    Current packs/day: 0.25    Average packs/day: 0.3 packs/day for 12.2 years (3.0 ttl pk-yrs)    Types: Cigarettes    Start date: 12/21/2011    Passive exposure: Never   Smokeless tobacco: Never  Vaping Use   Vaping status: Never Used  Substance and Sexual Activity   Alcohol use: Yes    Comment: ocassionally    Drug use: Not Currently    Types: Marijuana    Comment: Last use was in 2023   Sexual activity: Not Currently    Partners: Male    Birth control/protection: Surgical    Comment: Tubal Ligation  Other Topics Concern   Not on file  Social History  Narrative   Lives with her grandmother and her three children   Does not drink caffeine    Social Drivers of Corporate Investment Banker Strain: Low Risk  (10/16/2023)   Overall Financial Resource Strain (CARDIA)    Difficulty of Paying Living Expenses: Not hard at all  Food Insecurity: No Food Insecurity (02/18/2024)   Hunger Vital Sign    Worried About Running Out of Food in the Last Year: Never true    Ran Out of Food in the Last Year: Never true  Transportation Needs: No Transportation Needs (02/18/2024)   PRAPARE - Administrator, Civil Service (Medical): No    Lack of Transportation (Non-Medical): No  Physical Activity: Sufficiently Active (10/16/2023)   Exercise Vital Sign    Days of Exercise per Week: 4 days    Minutes of Exercise per Session: 120 min  Stress: Stress Concern Present (10/16/2023)   [employer] of Occupational Health - Occupational Stress Questionnaire    Feeling of Stress: To some extent  Social Connections: Moderately Isolated (10/16/2023)   Social Connection and Isolation Panel    Frequency of Communication with Friends and Family: More than three times a week    Frequency of Social Gatherings with Friends and Family: Patient declined    Attends Religious Services: 1 to 4 times per year    Active Member of [employer] or Organizations: No    Attends Engineer, Structural: Not on file    Marital Status: Never married  Intimate Partner Violence: Not At Risk (02/14/2024)   Humiliation, Afraid, Rape, and Kick questionnaire    Fear of Current or Ex-Partner: No    Emotionally Abused: No    Physically Abused: No    Sexually Abused: No    FAMILY HISTORY: Family History  Problem Relation Age of Onset   Diabetes Mother    Stroke Mother    Colon cancer Father 75   Cancer Father    Hypertension Maternal Grandmother    Diabetes Maternal Grandmother    Hypertension Paternal Grandmother    Esophageal cancer Neg Hx    Liver disease Neg Hx      ALLERGIES:  is allergic to keflex  [cephalexin ].  MEDICATIONS:  Current Outpatient Medications  Medication Sig Dispense Refill   acetaminophen  (TYLENOL ) 500 MG tablet Take 1,000 mg by mouth every 6 (six) hours as needed for moderate pain (pain score 4-6), fever or headache.     nitrofurantoin , macrocrystal-monohydrate, (MACROBID ) 100 MG capsule Take 1 capsule (100 mg total) by mouth 2 (two) times daily. 10 capsule 0   omeprazole  (PRILOSEC OTC) 20 MG tablet Take 20 mg by mouth  daily.     oxyCODONE  (OXY IR/ROXICODONE ) 5 MG immediate release tablet Take 1 tablet (5 mg total) by mouth every 4 (four) hours as needed for severe pain (pain score 7-10). For AFTER surgery only, do not take and drive 20 tablet 0   polyethylene glycol powder (GLYCOLAX /MIRALAX ) 17 GM/SCOOP powder Take 233.75 g by mouth once for 1 dose Take according to your procedure prep instructions. 238 g 0   promethazine  (PHENERGAN ) 25 MG tablet Take 1 tablet (25 mg total) by mouth every 6 (six) hours as needed for nausea or vomiting. 30 tablet 0   No current facility-administered medications for this visit.    REVIEW OF SYSTEMS: She has some minor pain since surgery.  Some mild vaginal spotting  All other systems were reviewed with the patient and are negative.  PHYSICAL EXAMINATION: ECOG PERFORMANCE STATUS: 1 - Symptomatic but completely ambulatory  Vitals:   02/18/24 1418  BP: (!) 123/58  Pulse: (!) 107  Resp: 18  Temp: 99.4 F (37.4 C)  SpO2: 100%   Filed Weights   02/18/24 1418  Weight: 218 lb (98.9 kg)    GENERAL:alert, no distress and comfortable SKIN: skin color, texture, turgor are normal, no rashes or significant lesions EYES: normal, conjunctiva are pink and non-injected, sclera clear OROPHARYNX:no exudate, no erythema and lips, buccal mucosa, and tongue normal  NECK: supple, thyroid normal size, non-tender, without nodularity LYMPH:  no palpable lymphadenopathy in the cervical, axillary or  inguinal LUNGS: clear to auscultation and percussion with normal breathing effort HEART: regular rate & rhythm and no murmurs and no lower extremity edema ABDOMEN:abdomen soft, non-tender and normal bowel sounds Musculoskeletal:no cyanosis of digits and no clubbing  PSYCH: alert & oriented x 3 with fluent speech NEURO: no focal motor/sensory deficits  LABORATORY DATA:  I have reviewed the data as listed Lab Results  Component Value Date   WBC 8.3 02/13/2024   HGB 12.1 02/13/2024   HCT 38.1 02/13/2024   MCV 94.1 02/13/2024   PLT 348 02/13/2024   Recent Labs    07/24/23 1554 09/04/23 0932 10/25/23 0031 12/08/23 0206 12/08/23 1528 12/11/23 0145 12/16/23 1019 01/20/24 0835 01/28/24 2238 02/04/24 0856 02/11/24 0444 02/12/24 0631 02/13/24 0913 02/14/24 1009  NA 138 141   < > 142   < > 139 139  --    < > 139   < > 137 137 136  K 3.9 4.2   < > 3.6   < > 3.5 4.2  --    < > 3.5   < > 3.6 3.3* 3.6  CL 105 103   < > 106   < > 104 104  --    < > 105   < > 103 102 100  CO2 24 29   < > 24   < > 23 25  --   --  23   < > 25 25 24   GLUCOSE 89 82   < > 146*   < > 96 77  --    < > 84   < > 91 93 125*  BUN 13 10   < > 14   < > <5* 11  --    < > 10   < > <5* <5* 6  CREATININE 0.88 0.99   < > 1.07*   < > 1.06* 0.82  --    < > 1.06*   < > 0.81 0.84 0.83  CALCIUM 9.5 9.7   < > 9.5   < >  8.4* 9.8  --   --  9.5   < > 9.2 9.2 9.4  GFRNONAA  --   --    < > >60   < > >60  --   --   --  >60   < > >60 >60 >60  PROT 6.9 7.2   < > 7.0  --  5.6* 7.4 6.7  --  7.3  --   --   --   --   ALBUMIN  --  4.5   < > 4.2  --  2.9*  --   --   --  4.4  --   --   --   --   AST 14 14   < > 16  --  34 16 12  --  17  --   --   --   --   ALT 19 12   < > 9  --  38 27 12  --  14  --   --   --   --   ALKPHOS  --  70   < > 67  --  47  --   --   --  63  --   --   --   --   BILITOT 0.8 1.5*   < > 0.5  --  1.2 1.3* 1.3*  --  0.7  --   --   --   --   BILIDIR 0.1 0.2  --   --   --   --   --  0.2  --   --   --   --   --   --    IBILI 0.7  --   --   --   --   --   --  1.1  --   --   --   --   --   --    < > = values in this interval not displayed.    RADIOGRAPHIC STUDIES: I have personally reviewed the radiological images as listed and agreed with the findings in the report. NM PET Image Restage (PS) Skull Base to Thigh (F-18 FDG) Result Date: 02/10/2024 EXAM: PET AND CT SKULL BASE TO MID THIGH 02/08/2024 10:00:34 AM TECHNIQUE: RADIOPHARMACEUTICAL: 10.9 mCi F-18 FDG Uptake time 60 minutes. Glucose level 93 mg/dl. PET imaging was acquired from the base of the skull to the mid thighs. Non-contrast enhanced computed tomography was obtained for attenuation correction and anatomic localization. COMPARISON: CT 01/28/2024 MRI pelvis 02/08/2024. CLINICAL HISTORY: Cervical cancer, monitor. 10.90 mCi F18 FDG IV R FA (placed by IV team) @ 0848 / HJ; FBG = 93 mg/dl ; RESTAGING FOR MALIGNANT NEOPLASM OF CERVIX ; Vaginal cuff bx 01/21/2024 ; EOV. FINDINGS: HEAD AND NECK: No metabolically active cervical lymphadenopathy. CHEST: No metabolically active pulmonary nodules. No metabolically active lymphadenopathy. ABDOMEN AND PELVIS: Mild asymmetry of metabolic activity along the left side of the vaginal cuff with SUV max equals 4.6 on image 177. This is in the vicinity of the soft tissue thickening on the comparison pelvic MRI. No hypermetabolic pelvic lymph nodes. No evidence of metastatic adenopathy outside of the pelvis. No visceral metastasis. Physiologic activity within the gastrointestinal and genitourinary systems. BONES AND SOFT TISSUE: No skeletal metastasis. No abnormal FDG activity localizes to the bones. No metabolically active aggressive osseous lesion. IMPRESSION: 1. Mild asymmetric hypermetabolism along the left vaginal cuff (SUV max 4.6) corresponding to soft tissue thickening on prior pelvic MRI. 2. No hypermetabolic  pelvic lymphadenopathy. 3. No evidence of extra-pelvic nodal, visceral, or skeletal metastasis. Electronically signed  by: Norleen Boxer MD 02/10/2024 10:19 AM EDT RP Workstation: [id number]   MR Hip Right w/o contrast Result Date: 02/09/2024 MR HIP WITHOUT IV CONTRAST COMPARISON: None. CLINICAL HISTORY: Labral tear. Right hip pain. PULSE SEQUENCES: AX T1, Ax T2 FS, Cor T1, COR STIR & SMALL FOV COR PD FS with contrast. FINDINGS: Bones and labrum: There is no fracture or contusion pattern. No accelerated arthrosis is present. No subchondral reactive edema or significant joint effusion is present. Mild degenerative changes in the labrum without evidence of discrete labral tear. Pelvic ring is intact. There is mild sclerosis in the SI joints likely related to chronic osteitis. There is no edema or joint fluid present. Sacrum is unremarkable. Musculotendinous structures: There is mild insertional tendinosis of the gluteus medius and minimus tendons. No bursal collection. Limited evaluation of the intrapelvic structures demonstrate no significant abnormality. IMPRESSION: Mild degenerative changes in the right hip with slight degenerative changes in the labrum. No discrete labral tear. No accelerated arthrosis or acute abnormality. Mild sclerosis in the iliac wing suggesting osteitis. Otherwise, unremarkable MRI of the right hip. Mild edema in the contralateral left inguinal region with multiple small lymph nodes. Correlation with PET/CT recommended. Mild fluid and lymph nodes are seen in the left intrapelvic region. Electronically signed by: Norleen Satchel MD 02/09/2024 05:32 PM EDT RP Workstation: [id number]   MR Pelvis W Wo Contrast Result Date: 02/09/2024 CLINICAL DATA:  History of cervical cancer, recurrence suspected on vaginal biopsy. EXAM: MRI PELVIS WITHOUT AND WITH CONTRAST TECHNIQUE: Multiplanar multisequence MR imaging of the pelvis was performed both before and after administration of intravenous contrast. CONTRAST:  10mL GADAVIST  GADOBUTROL  1 MMOL/ML IV SOLN COMPARISON:  Multiple priors including CT January 28, 2024 and  PET-CT November 16, 2023 and MRI pelvis November 01, 2023. FINDINGS: Urinary Tract: Urinary bladder is unremarkable for degree of distension. Bowel: No evidence of bowel obstruction. Large volume of stool in the rectum. Vascular/Lymphatic: No pathologically enlarged pelvic lymph nodes. Reproductive: Uterus is surgically absent. Increased size of the T2 hyperintense asymmetric nodular focus along the left vaginal cuff now measuring 2.8 x 1.3 cm on image 25/3 previously 1.6 x 1.3 cm. This demonstrates enhancement with restricted diffusion on image 33/4 and reduced ADC signal on image 33/5 Dominant follicle in the right ovary with the additional small follicles in the bilateral ovaries. Other:  No significant abdominal free fluid. Musculoskeletal: No aggressive osseous lesion. IMPRESSION: 1. Increased size of the T2 hyperintense asymmetric nodular focus along the left vaginal cuff now measuring 2.8 x 1.3 cm which demonstrates enhancement with increased DWI signal and reduced ADC signal. Findings are compatible with given history of suspected recurrence on biopsy. 2. No evidence of pelvic adenopathy. Electronically Signed   By: Reyes Holder M.D.   On: 02/09/2024 07:57   CT ABDOMEN PELVIS W CONTRAST Result Date: 01/28/2024 CLINICAL DATA:  Diverticulitis with complication suspected. Left lower quadrant abdominal pain starting 2 hours ago. Nausea and vomiting today. EXAM: CT ABDOMEN AND PELVIS WITH CONTRAST TECHNIQUE: Multidetector CT imaging of the abdomen and pelvis was performed using the standard protocol following bolus administration of intravenous contrast. RADIATION DOSE REDUCTION: This exam was performed according to the departmental dose-optimization program which includes automated exposure control, adjustment of the mA and/or kV according to patient size and/or use of iterative reconstruction technique. CONTRAST:  OMNIPAQUE  IOHEXOL  300 MG/ML  SOLN COMPARISON:  12/08/2023 FINDINGS: Lower chest: Lung  bases are  clear. Hepatobiliary: Mild diffuse fatty infiltration. No focal liver lesions. Gallbladder and bile ducts are normal. Pancreas: Unremarkable. No pancreatic ductal dilatation or surrounding inflammatory changes. Spleen: Normal in size without focal abnormality. Adrenals/Urinary Tract: Adrenal glands are unremarkable. Kidneys are normal, without renal calculi, focal lesion, or hydronephrosis. Bladder is unremarkable. Stomach/Bowel: Stomach, small bowel, and colon are not abnormally distended. Under distention of the colon limits evaluation but there appears to be diffuse colonic wall thickening and edema likely indicating colitis. This could represent infectious or inflammatory colitis. Similar appearance to previous study. No pericolonic abscess. Appendix is normal. Vascular/Lymphatic: No significant vascular findings are present. No enlarged abdominal or pelvic lymph nodes. Reproductive: Uterus is surgically absent.  No adnexal masses. Other: No free air or free fluid in the abdomen. Abdominal wall musculature appears intact. Musculoskeletal: No acute or significant osseous findings. IMPRESSION: 1. Diffuse colonic wall thickening likely representing infectious or inflammatory colitis. Similar appearance to previous study. No bowel obstruction. 2. Mild diffuse fatty infiltration of the liver. Electronically Signed   By: Elsie Gravely M.D.   On: 01/28/2024 21:03

## 2024-02-18 NOTE — Assessment & Plan Note (Signed)
 The patient has abnormal Pap smear dated back to 2017.  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  Subsequent follow-up in October confirmed likelihood of recurrent disease.  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  There is present of lymphovascular invasion and perineural invasion  She is still recovering from surgery I will add additional molecular testing to her recent pathology for PD-L1 testing We discussed the role of adjuvant treatment She has appointment to see radiation oncologist next week If her PD-L1 test came back positive, she would qualify for concurrent chemoradiation therapy with cisplatin and pembrolizumab Her previous PD-L1 test from 2024 was negative  For today, I recommend the patient to focus on healing We discussed port placement I will bring her back when she is ready to start treatment next month; hopefully, we will have results of the PD-L1 back by then  We discussed the role of chemotherapy. The intent is of curative intent.  We discussed some of the risks, benefits, side-effects of cisplatin  Some of the short term side-effects included, though not limited to, including weight loss, life threatening infections, risk of allergic reactions, need for transfusions of blood products, nausea, vomiting, change in bowel habits, loss of hair, admission to hospital for various reasons, and risks of death.   Long term side-effects are also discussed including risks of infertility, permanent damage to nerve function, hearing loss, chronic fatigue, kidney damage with possibility needing hemodialysis, and rare secondary malignancy  including bone marrow disorders.  The patient is aware that the response rates discussed earlier is not guaranteed.  After a long discussion, patient made an informed decision to proceed with the prescribed plan of care.   Patient education material was dispensed.

## 2024-02-19 ENCOUNTER — Telehealth: Payer: Self-pay | Admitting: *Deleted

## 2024-02-19 NOTE — Telephone Encounter (Signed)
 Spoke with Holly Brown who states she is emptying her bladder every 2-3 hours for 200-300 cc of clear, yellow urine. Pt denies fever, chills and pain. Reports having left lower pelvic pressure only when she empties her bladder. Advised patient this is the side she had the stent placed and it has since been removed, maybe some irritation from that side. Advised patient to continue to monitor and take her antibiotics and the office will call back with urine culture results. Pt verbalized understanding and thanked the office for calling.

## 2024-02-20 LAB — URINE CULTURE: Culture: >=100000 — AB

## 2024-02-21 ENCOUNTER — Other Ambulatory Visit: Payer: Self-pay

## 2024-02-21 ENCOUNTER — Encounter (HOSPITAL_COMMUNITY): Payer: Self-pay | Admitting: *Deleted

## 2024-02-21 ENCOUNTER — Emergency Department (HOSPITAL_COMMUNITY)

## 2024-02-21 ENCOUNTER — Emergency Department (HOSPITAL_COMMUNITY): Admission: EM | Admit: 2024-02-21 | Discharge: 2024-02-21 | Disposition: A | Discharge status: Home / Self Care

## 2024-02-21 DIAGNOSIS — Z8541 Personal history of malignant neoplasm of cervix uteri: Secondary | ICD-10-CM | POA: Insufficient documentation

## 2024-02-21 DIAGNOSIS — N390 Urinary tract infection, site not specified: Secondary | ICD-10-CM | POA: Diagnosis not present

## 2024-02-21 DIAGNOSIS — R102 Pelvic and perineal pain unspecified side: Secondary | ICD-10-CM | POA: Diagnosis present

## 2024-02-21 LAB — COMPREHENSIVE METABOLIC PANEL WITH GFR
ALT: 41 U/L (ref 0–44)
AST: 37 U/L (ref 15–41)
Albumin: 4 g/dL (ref 3.5–5.0)
Alkaline Phosphatase: 95 U/L (ref 38–126)
Anion gap: 11 (ref 5–15)
BUN: 11 mg/dL (ref 6–20)
CO2: 25 mmol/L (ref 22–32)
Calcium: 9.5 mg/dL (ref 8.9–10.3)
Chloride: 107 mmol/L (ref 98–111)
Creatinine, Ser: 0.91 mg/dL (ref 0.44–1.00)
GFR, Estimated: >60 mL/min (ref >60)
Glucose, Bld: 102 mg/dL — ABNORMAL HIGH (ref 70–99)
Potassium: 4.1 mmol/L (ref 3.5–5.1)
Sodium: 143 mmol/L (ref 135–145)
Total Bilirubin: 0.5 mg/dL (ref 0.0–1.2)
Total Protein: 7.2 g/dL (ref 6.5–8.1)

## 2024-02-21 LAB — CBC WITH DIFFERENTIAL/PLATELET
Abs Immature Granulocytes: 0.02 K/uL (ref 0.00–0.07)
Basophils Absolute: 0.1 K/uL (ref 0.0–0.1)
Basophils Relative: 1 %
Eosinophils Absolute: 0.3 K/uL (ref 0.0–0.5)
Eosinophils Relative: 3 %
HCT: 37.8 % (ref 36.0–46.0)
Hemoglobin: 12.1 g/dL (ref 12.0–15.0)
Immature Granulocytes: 0 %
Lymphocytes Relative: 26 %
Lymphs Abs: 2.3 K/uL (ref 0.7–4.0)
MCH: 30.3 pg (ref 26.0–34.0)
MCHC: 32 g/dL (ref 30.0–36.0)
MCV: 94.5 fL (ref 80.0–100.0)
Monocytes Absolute: 0.6 K/uL (ref 0.1–1.0)
Monocytes Relative: 7 %
Neutro Abs: 5.5 K/uL (ref 1.7–7.7)
Neutrophils Relative %: 63 %
Platelets: 523 K/uL — ABNORMAL HIGH (ref 150–400)
RBC: 4 MIL/uL (ref 3.87–5.11)
RDW: 12.3 % (ref 11.5–15.5)
WBC: 8.8 K/uL (ref 4.0–10.5)
nRBC: 0 % (ref 0.0–0.2)

## 2024-02-21 LAB — URINALYSIS, W/ REFLEX TO CULTURE (INFECTION SUSPECTED)
Bacteria, UA: NONE SEEN
Bilirubin Urine: NEGATIVE
Glucose, UA: 50 mg/dL — AB
Hgb urine dipstick: NEGATIVE
Ketones, ur: NEGATIVE mg/dL
Nitrite: NEGATIVE
Protein, ur: 100 mg/dL — AB
RBC / HPF: >50 RBC/hpf (ref 0–5)
Specific Gravity, Urine: 1.02 (ref 1.005–1.030)
WBC, UA: >50 WBC/hpf (ref 0–5)
pH: 8 (ref 5.0–8.0)

## 2024-02-21 LAB — I-STAT CG4 LACTIC ACID, ED: Lactic Acid, Venous: 1.3 mmol/L (ref 0.5–1.9)

## 2024-02-21 MED ORDER — SODIUM CHLORIDE 0.9 % IV BOLUS
1000.0000 mL | Freq: Once | INTRAVENOUS | Status: AC
  Administered 2024-02-21: 1000 mL via INTRAVENOUS

## 2024-02-21 MED ORDER — MORPHINE SULFATE (PF) 4 MG/ML IV SOLN
4.0000 mg | Freq: Once | INTRAVENOUS | Status: AC
  Administered 2024-02-21: 4 mg via INTRAVENOUS
  Filled 2024-02-21: qty 1

## 2024-02-21 MED ORDER — CEFPODOXIME PROXETIL 200 MG PO TABS: 200.0000 mg | ORAL_TABLET | Freq: Two times a day (BID) | ORAL | 0 refills | Status: DC

## 2024-02-21 MED ORDER — CEFPODOXIME PROXETIL 200 MG PO TABS
200.0000 mg | ORAL_TABLET | Freq: Two times a day (BID) | ORAL | 0 refills | Status: DC
  Filled 2024-02-21 – 2024-02-23 (×2): qty 20, 10d supply, fill #0

## 2024-02-21 MED ORDER — OXYBUTYNIN CHLORIDE ER 10 MG PO TB24
10.0000 mg | ORAL_TABLET | Freq: Every day | ORAL | 0 refills | Status: AC
  Filled 2024-02-21: qty 7, 7d supply, fill #0

## 2024-02-21 MED ORDER — OXYBUTYNIN CHLORIDE 5 MG PO TABS
5.0000 mg | ORAL_TABLET | Freq: Once | ORAL | Status: AC
  Administered 2024-02-21: 5 mg via ORAL
  Filled 2024-02-21: qty 1

## 2024-02-21 MED ORDER — OXYBUTYNIN CHLORIDE ER 10 MG PO TB24: 10.0000 mg | ORAL_TABLET | Freq: Every day | ORAL | 0 refills | Status: DC

## 2024-02-21 MED ORDER — SODIUM CHLORIDE 0.9 % IV SOLN
1.0000 g | Freq: Once | INTRAVENOUS | Status: AC
  Administered 2024-02-21: 1 g via INTRAVENOUS
  Filled 2024-02-21: qty 10

## 2024-02-21 MED ORDER — ONDANSETRON HCL 4 MG/2ML IJ SOLN
4.0000 mg | Freq: Once | INTRAMUSCULAR | Status: AC
  Administered 2024-02-21: 4 mg via INTRAVENOUS
  Filled 2024-02-21: qty 2

## 2024-02-21 NOTE — ED Provider Notes (Signed)
 [place] EMERGENCY DEPARTMENT AT [facility] Provider Note   CSN: [id number] Arrival date & time: 02/21/24  1439     Patient presents with: Vaginal Pain   Holly Brown is a 38 y.o. female past medical history significant for cervical cancer, most recently having a upper vaginectomy, left parametrectomy, left ureterolysis, and ovarian transposition surgery on 10/22.  Patient had Foley removal on 10/30.  Patient reporting bladder pressure, vaginal cramping/spasms and flank pain.  Patient denies vaginal discharge, fever, chills, nausea, vomiting, chest pain, shortness of breath, any other complaints at this time.  Patient got her Foley removed her urine was cloudy and they started the patient on Macrobid .    Vaginal Pain       Prior to Admission medications   Medication Sig Start Date End Date Taking? Authorizing Provider  cefpodoxime (VANTIN) 200 MG tablet Take 1 tablet (200 mg total) by mouth 2 (two) times daily. 02/21/24  Yes Francis Holly SAILOR, PA-C  oxybutynin (DITROPAN XL) 10 MG 24 hr tablet Take 1 tablet (10 mg total) by mouth at bedtime. 02/21/24  Yes Nedda Gains N, PA-C  acetaminophen  (TYLENOL ) 500 MG tablet Take 1,000 mg by mouth every 6 (six) hours as needed for moderate pain (pain score 4-6), fever or headache.    [provider]  omeprazole  (PRILOSEC OTC) 20 MG tablet Take 20 mg by mouth daily.    [provider]  oxyCODONE  (OXY IR/ROXICODONE ) 5 MG immediate release tablet Take 1 tablet (5 mg total) by mouth every 4 (four) hours as needed for severe pain (pain score 7-10). For AFTER surgery only, do not take and drive [address]   Cross, Melissa D, NP  polyethylene glycol powder (GLYCOLAX /MIRALAX ) 17 GM/SCOOP powder Take 233.75 g by mouth once for 1 dose Take according to your procedure prep instructions. 02/03/24     promethazine  (PHENERGAN ) 25 MG tablet Take 1 tablet (25 mg total) by mouth every 6 (six) hours as needed for nausea or vomiting.  01/28/24   Mannie Fairy DASEN, DO    Allergies: Keflex  [cephalexin ]    Review of Systems  Genitourinary:  Positive for flank pain.    Updated Vital Signs BP 117/65 (BP Location: Left Arm)   Pulse 79   Temp 97.8 F (36.6 C) (Oral)   Resp 18   Ht 5' 10.5 (1.791 m)   Wt 98.9 kg   LMP  (LMP Unknown) Comment: Depo Injections - last injection 10/2022, had spotting on and off since 10/2022 injection  SpO2 100%   BMI 30.84 kg/m   Physical Exam Vitals and nursing note reviewed.  Constitutional:      General: She is not in acute distress.    Appearance: She is well-developed. She is not toxic-appearing.     Comments: Uncomfortable appearing  HENT:     Head: Normocephalic and atraumatic.     Right Ear: External ear normal.     Left Ear: External ear normal.     Nose: Nose normal.  Eyes:     Conjunctiva/sclera: Conjunctivae normal.  Cardiovascular:     Rate and Rhythm: Regular rhythm. Tachycardia present.     Pulses: Normal pulses.  Pulmonary:     Effort: Pulmonary effort is normal. No respiratory distress.  Abdominal:     Palpations: Abdomen is soft.     Tenderness: There is no abdominal tenderness.  Musculoskeletal:        General: No swelling.     Cervical back: Neck supple.  Skin:  General: Skin is warm and dry.     Capillary Refill: Capillary refill takes less than 2 seconds.  Neurological:     General: No focal deficit present.     Mental Status: She is alert and oriented to person, place, and time.  Psychiatric:        Mood and Affect: Mood normal.     (all labs ordered are listed, but only abnormal results are displayed) Labs Reviewed  URINALYSIS, W/ REFLEX TO CULTURE (INFECTION SUSPECTED) - Abnormal; Notable for the following components:      Result Value   APPearance CLOUDY (*)    Glucose, UA 50 (*)    Protein, ur 100 (*)    Leukocytes,Ua LARGE (*)    Non Squamous Epithelial 0-5 (*)    All other components within normal limits  COMPREHENSIVE METABOLIC  PANEL WITH GFR - Abnormal; Notable for the following components:   Glucose, Bld 102 (*)    All other components within normal limits  CBC WITH DIFFERENTIAL/PLATELET - Abnormal; Notable for the following components:   Platelets 523 (*)    All other components within normal limits  URINE CULTURE  I-STAT CG4 LACTIC ACID, ED    EKG: None  Radiology: CT ABDOMEN PELVIS WO CONTRAST Addendum Date: 02/21/2024 ADDENDUM REPORT: 02/21/2024 17:22 ADDENDUM: Critical Value/emergent results were called by telephone at the time of interpretation on 02/21/2024 at 5:18 pm to provider Holly ECK , who verbally acknowledged these results. Electronically Signed   By: Leita Birmingham M.D.   On: 02/21/2024 17:22   Result Date: 02/21/2024 CLINICAL DATA:  Abdominal/flank pain, stone suspected. Burning and pain in vaginal area and back. Vaginal surgery due to cancer on 02/10/2024. EXAM: CT ABDOMEN AND PELVIS WITHOUT CONTRAST TECHNIQUE: Multidetector CT imaging of the abdomen and pelvis was performed following the standard protocol without IV contrast. RADIATION DOSE REDUCTION: This exam was performed according to the departmental dose-optimization program which includes automated exposure control, adjustment of the mA and/or kV according to patient size and/or use of iterative reconstruction technique. COMPARISON:  01/28/2024. FINDINGS: Lower chest: Mild atelectasis is present at the lung bases. Hepatobiliary: No focal liver abnormality is seen. No gallstones, gallbladder wall thickening, or biliary dilatation. Pancreas: Unremarkable. No pancreatic ductal dilatation or surrounding inflammatory changes. Spleen: Normal in size without focal abnormality. Adrenals/Urinary Tract: The adrenal glands are within normal limits. No renal calculus or hydronephrosis. Bladder wall thickening is noted posteriorly which may be due to associated postsurgical inflammatory changes. Stomach/Bowel: The stomach is within normal limits. No bowel  obstruction or pneumatosis is seen. A small amount of free air is noted in the right upper quadrant. A moderate amount of retained stools present in the colon. Appendix appears normal. Vascular/Lymphatic: No significant vascular findings are present. No enlarged abdominal or pelvic lymph nodes. Reproductive: The uterus is surgically absent and fat stranding and a small amount of free fluid are noted in the surgical bed. There are a soft tissue structures in the pericolic gutters bilaterally with associated surgical clips, query ovaries. Other: Surgical changes are noted in the anterior abdominal wall. Subcutaneous air is present in the anterior abdominal wall on the right. Musculoskeletal: There is sclerosis at the sacroiliac joints bilaterally, compatible with sacroiliitis. No acute osseous abnormality is seen. IMPRESSION: 1. Postsurgical changes in the pelvis with surrounding inflammatory changes and small amount of free fluid. A small amount of free air is noted in the right upper quadrant, which may be iatrogenic. 2. Bladder wall thickening, which may  be associated with local inflammatory changes versus infectious cystitis. 3. Soft tissue densities with associated surgical clips in the pericolic gutters bilaterally, new from the previous exam and likely postsurgical. Electronically Signed: By: Leita Birmingham M.D. On: 02/21/2024 17:12     Procedures   Medications Ordered in the ED  oxybutynin (DITROPAN) tablet 5 mg (has no administration in time range)  cefTRIAXone  (ROCEPHIN ) 1 g in sodium chloride  0.9 % 100 mL IVPB (0 g Intravenous Stopped 02/21/24 1811)  sodium chloride  0.9 % bolus 1,000 mL (0 mLs Intravenous Stopped 02/21/24 1856)  ondansetron  (ZOFRAN ) injection 4 mg (4 mg Intravenous Given 02/21/24 1705)  morphine  (PF) 4 MG/ML injection 4 mg (4 mg Intravenous Given 02/21/24 1706)                                    Medical Decision Making Amount and/or Complexity of Data Reviewed Labs:  ordered. Radiology: ordered.  Risk Prescription drug management.   This patient presents to the ED for concern of flank pain and bladder pressure differential diagnosis includes nephrolithiasis, UTI, pyelonephritis, sepsis, postop infection    Additional history obtained   Additional history obtained from Electronic Medical Record External records from outside source obtained and reviewed including recent urine culture which shows resistance to Macrobid .   Lab Tests:  I Ordered, and personally interpreted labs.  The pertinent results include: Lactic acid 1.3, elevated platelets at 523, CMP unremarkable, UA with 50 glucose, large leukocytes, 100 protein, greater than 50 RBCs, and greater than 50 WBCs   Imaging Studies ordered:  I ordered imaging studies including CT abdomen pelvis Noncon I independently visualized and interpreted imaging which showed postsurgical changes in the pelvis with surrounding inflammatory changes and a small amount of free fluid.  A small amount of free air is noted in the right upper quadrant, may be iatrogenic.  Bladder wall thickening, which may be associated with local amatory changes versus infectious cystitis.  Soft tissue densities with associated surgical clips in the pericolonic gutters bilaterally, likely postsurgical I agree with the radiologist interpretation   Medicines ordered and prescription drug management:  I ordered medication including Rocephin , morphine , zofran  oxybutynin    I have reviewed the patients home medicines and have made adjustments as needed Patient tolerated Rocephin  without any noted reaction   Problem List / ED Course:  Consulted gynecological oncology, Dr. Rusty felt this was likely due to her surgery on 10/22 and that no further workup was necessary for this finding.  She also stated that a short course of oxybutynin outpatient for vaginal symptoms would be appropriate. Considered for admission or further  workup however patient's vital signs, physical exam, labs, and imaging are reassuring.  Patient symptoms likely due to UTI.  Patient advised to stop taking Macrobid  and begin taking Vantin.  Patient also given short course of oxybutynin for vaginal spasms.  Patient advised to follow-up with primary care for further evaluation and workup.  Patient given return precautions.  I feel patient safe for discharge at this time.       Final diagnoses:  Urinary tract infection with hematuria, site unspecified    ED Discharge Orders          Ordered    cefpodoxime (VANTIN) 200 MG tablet  2 times daily        02/21/24 1915    oxybutynin (DITROPAN XL) 10 MG 24 hr tablet  Daily at  bedtime        02/21/24 1915               Francis Holly SAILOR, PA-C 02/21/24 1918    Holly Prentice SAUNDERS, MD 02/21/24 [phone]

## 2024-02-21 NOTE — ED Triage Notes (Signed)
 Pt has vaginal surgery due to cancer on 02/10/24. Foley has been removed. Pt having burning, pain in vaginal area and back. No discharge today

## 2024-02-21 NOTE — Discharge Instructions (Addendum)
 Today you were seen for urinary tract infection.  Please pick up your Vantin and oxybutynin for vaginal pain.  Discontinue taking Macrobid .  Please follow-up with your primary care or gynecologic oncologist for further evaluation and workup.  Thank you for letting us  treat you today. After reviewing your labs and imaging, I feel you are safe to go home. Please follow up with your PCP in the next several days and provide them with your records from this visit. Return to the Emergency Room if pain becomes severe or symptoms worsen.

## 2024-02-21 NOTE — ED Notes (Addendum)
 US  PIV Placed R wrist/forearm 22g 1.75''

## 2024-02-22 ENCOUNTER — Telehealth: Payer: Self-pay | Admitting: *Deleted

## 2024-02-22 ENCOUNTER — Other Ambulatory Visit (HOSPITAL_COMMUNITY): Payer: Self-pay

## 2024-02-22 ENCOUNTER — Other Ambulatory Visit: Payer: Self-pay | Admitting: Gynecologic Oncology

## 2024-02-22 ENCOUNTER — Encounter: Payer: Self-pay | Admitting: Radiology

## 2024-02-22 DIAGNOSIS — N3001 Acute cystitis with hematuria: Secondary | ICD-10-CM

## 2024-02-22 LAB — URINE CULTURE: Culture: >=10000 — AB

## 2024-02-22 MED ORDER — SULFAMETHOXAZOLE-TRIMETHOPRIM 800-160 MG PO TABS
1.0000 | ORAL_TABLET | Freq: Two times a day (BID) | ORAL | 0 refills | Status: DC
  Filled 2024-02-22: qty 6, 3d supply, fill #0

## 2024-02-22 NOTE — Progress Notes (Signed)
 Based on urine culture results, plan for changing antibiotic from macrobid  to bactrim .

## 2024-02-22 NOTE — Progress Notes (Signed)
 GYN Location of Tumor / Histology: {Blank single:19197::Endometrial,Cervical,Uterine}  [name] presented with symptoms of: {symptoms; gyn cyclic:13153::bleeding}  Biopsies revealed: ***  Past/Anticipated interventions by Gyn/Onc surgery, if any:    Past/Anticipated interventions by medical oncology, if any:    Weight changes, if any: {:18581}  Bowel/Bladder complaints, if any: {yes no:314532}, {Blank single:19197::diarrhea,constipation,urinary frequency,burning,trouble emptying bladder, }  Nausea/Vomiting, if any: {:18581}  Pain issues, if any:  {:18581}  SAFETY ISSUES: Prior radiation? {:18581} Pacemaker/ICD? {:18581} Possible current pregnancy? {:18581} Is the patient on methotrexate? {:18581}  Current Complaints / other details:  ***

## 2024-02-22 NOTE — Telephone Encounter (Signed)
 Spoke with Ms. Holly Brown who states her symptoms are improving since her ED visit yesterday. Pt states that when she voids the lower left pelvic discomfort is subsiding and she is relieved that the antibiotics that were prescribed are starting to work. Pt denies blood in her urine, fever and no chills. Pt is aware of her urine culture results, is keeping herself well hydrated and knows to call with any worsening symptoms or any new concerns or questions. Pt thanked the office for calling.

## 2024-02-23 ENCOUNTER — Inpatient Hospital Stay: Attending: Gynecologic Oncology | Admitting: Licensed Clinical Social Worker

## 2024-02-23 ENCOUNTER — Other Ambulatory Visit (HOSPITAL_COMMUNITY): Payer: Self-pay

## 2024-02-23 NOTE — Progress Notes (Signed)
 [facility] Clinical Social Work  Initial Assessment   Holly Brown is a 38 y.o. year old female contacted by phone. Clinical Social Work was referred by new patient protocol for assessment of psychosocial needs.   SDOH (Social Determinants of Health) assessments performed: Yes SDOH Interventions    Flowsheet Row Clinical Support from 02/23/2024 in [facility] - A Dept Of [place]. [facility] Office Visit from 10/30/2023 in [facility] [place]  SDOH Interventions    Food Insecurity Interventions [facility] Food Pantry --  Depression Interventions/Treatment  -- Counseling    SDOH Screenings   Food Insecurity: Food Insecurity Present (02/23/2024)  Housing: Low Risk  (02/18/2024)  Transportation Needs: No Transportation Needs (02/18/2024)  Utilities: Not At Risk (02/18/2024)  Alcohol Screen: Low Risk  (10/16/2023)  Depression (PHQ2-9): Low Risk  (12/16/2023)  Recent Concern: Depression (PHQ2-9) - Medium Risk (10/30/2023)  Financial Resource Strain: Low Risk  (10/16/2023)  Physical Activity: Sufficiently Active (10/16/2023)  Social Connections: Moderately Isolated (10/16/2023)  Stress: Stress Concern Present (10/16/2023)  Tobacco Use: Medium Risk (02/21/2024)    PHQ 2/9:    12/16/2023    9:14 AM 10/30/2023   10:01 AM 10/16/2023    9:01 AM  Depression screen PHQ 2/9  Decreased Interest 1 2 0  Down, Depressed, Hopeless 1 2 0  PHQ - 2 Score 2 4 0  Altered sleeping 0 1   Tired, decreased energy 1 1   Change in appetite 0 0   Feeling bad or failure about yourself  1 1   Trouble concentrating 0 0   Moving slowly or fidgety/restless 0 0   Suicidal thoughts 0 0   PHQ-9 Score 4 7   Difficult doing work/chores Somewhat difficult Somewhat difficult      Distress Screen completed: No     No data to display            Family/Social Information:  Housing Arrangement: patient lives with two of her children (11yo & 5yo). Two  teenage children  live with their dad and visit pt's home. Pt's aunt and nephew also live in the hom Family members/support persons in your life? Family and Friends Transportation concerns: no  Employment: Working full time for [employer]. She does have FMLA and short-term disability if needed  Income source: Employment Financial concerns: Potentially if/when income changes and if SNAP benefits don't come through due to government shut down Type of concern: Food Food access concerns: yes, is SNAP does not come through this month due to gov't shutdown Advanced directives: Not known Services Currently in place:  [employer] & [employer] Medicaid; SNAP  Coping/ Adjustment to diagnosis: Patient understands treatment plan and what happens next? yes, she reports still not feeling like it is real but does understand the plan for treatment.  She has support from family and a few close friends. She still has a friendship with her children's father and he is helping communicate what is happening with their kids (the two oldest know so far).  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, Actuary, and Adjusting to my illness Current coping skills/ strengths: Capable of independent living , Motivation for treatment/growth , and Supportive family/friends     SUMMARY: Current SDOH Barriers:  Financial constraints related to benefits delays and potential decrease in income Emotional adjustment to illness  Clinical Social Work Clinical Goal(s):  Explore community resource options for unmet  needs related to:  Financial Strain   Interventions: Discussed common feeling and emotions when being diagnosed with cancer, and the importance of support during treatment Informed patient of the support team roles and support services at [facility] Provided CSW contact information and encouraged patient to call with any questions or concerns Provided patient with  information about local food pantries. Provided bag from [facility] food pantry  Referred to [employer] Provided information on organizations that provide parent/child support through cancer ([employer]; [employer])   Follow Up Plan: Patient will contact CSW with any support or resource needs and CSW will meet with pt during infusion visit Patient verbalizes understanding of plan: Yes    Markevious Ehmke E Holly Felty, LCSW Clinical Social Worker [facility]  Patient is participating in a Managed Medicaid Plan:  Yes

## 2024-02-23 NOTE — Progress Notes (Signed)
 Radiation Oncology         [phone] ________________________________  Initial Outpatient Consultation  Name: Holly Brown MRN: [id number]  Date: 02/24/2024  DOB: [birth year]  RR:Whzupry, [name], NP  [name] Comer SAUNDERS, MD   REFERRING PHYSICIAN: [name] Comer SAUNDERS, MD  DIAGNOSIS: There were no encounter diagnoses.  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 female who is accompanied by ***. she is seen as a courtesy of Dr. [name] for an opinion concerning radiation therapy as part of management for her recurrent cervical cancer. Patient developed high-grade dysplasia of the cervix treated with cervical cone biopsy.  In 2024, she underwent hysterectomy revealing invasive moderately differentiated squamous cell carcinoma without the need for adjuvant treatment.  During an ED visit for colitis, patient was found to have an ovarian cyst on imaging. She then underwent a pelvic ultrasound on 6/12 showing a complex left ovarian cyst measures 3.5 x 2.6 x 2.6 cm with follow up recommended. MRI pelvis on 7/13 showing a nodular focus of enhancing slightly T2 hyperintense soft tissue along the left side of the vaginal cuff which restricts diffusion measuring 16 x 13 mm, suspicious for local recurrence and a intrinsically T1 hyperintense left ovarian lesion measuring 2.4 cm demonstrates internal layering complexity adherent to the dependent wall favored a hemorrhagic cyst with adherent clot.    Repeat CT pelvis on 9/29 showing interval resolution of previously seen complex left ovarian cyst, consistent with a resolved hemorrhagic cyst and a new 3.5 cm complex right ovarian cyst, most characteristic of a benign hemorrhagic cyst.  She was seen by Dr. [name] on 01/21/24 where she underwent a pap smearing with results showing HSIL, HPV high risk+ and a vaginal biopsy revealing squamous cell carcinoma in situ at least, cannot rule out  invasion. In light of findings, Dr. [name] recommended undergoing surgical intervention. Repeat MRI pelvis on 10/20 showed increased size of the T2 hyperintense asymmetric nodular focus along the left vaginal cuff now measuring 2.8 x 1.3 cm which demonstrates enhancement with increased DWI signal and reduced ADC signal.  Restaging PET performed on 02/08/24 showed mild asymmetric hypermetabolism along the left vaginal cuff (SUV max 4.6) corresponding to soft tissue thickening on prior pelvic MRI. No hypermetabolic pelvic lymphadenopathy was indicated on scan.   On 02/10/24, she underwent a robotic-assisted upper vaginectomy, left parametrectomy, left ureterolysis, bilateral ovarian transposition and cystoscopy under the care of Dr. [name]. Surgical pathology of partial vaginectomy showing 2.5 cm invasive squamous cell carcinoma with lymphovascular and focal perineural invasion (pT1b) with associated high grade squamous dysplasia.  Carcinoma invades to a depth of 0.9 cm. Invasive carcinoma focally 2 mm from 3-6 o'clock soft tissue margin.        She was seen in consultation with Dr. Lonn on 02/18/24 to discuss further treatment plan. Dr. Lonn recommends undergoing chemotherapy with cisplatin and pembrolizumab with treatment to be initiated in the next few weeks.     PREVIOUS RADIATION THERAPY: No  PAST MEDICAL HISTORY:  Past Medical History:  Diagnosis Date   Anxiety    Cancer (HCC)    cervical cancer   Depression    Fatty liver    GERD (gastroesophageal reflux disease)    History of hiatal hernia    History of UTI    HSV (herpes simplex virus) infection    Migraines    Peripheral vascular disease    Post-operative nausea and vomiting 12/19/2022  Seizures (HCC)    Jan.2016 - only had one seizure, none since   Sleep apnea    no CPAP   Vaginal Pap smear, abnormal     PAST SURGICAL HISTORY: Past Surgical History:  Procedure Laterality Date   BIOPSY  12/24/2022   Procedure:  BIOPSY;  Surgeon: Leigh Elspeth SQUIBB, MD;  Location: [facility] ENDOSCOPY;  Service: Gastroenterology;;   BONE BIOPSY  12/12/2023   Procedure: BIOPSY, GI;  Surgeon: Federico Rosario BROCKS, MD;  Location: [facility] ENDOSCOPY;  Service: Gastroenterology;;   COLONOSCOPY N/A 12/12/2023   Procedure: COLONOSCOPY;  Surgeon: Federico Rosario BROCKS, MD;  Location: [facility] ENDOSCOPY;  Service: Gastroenterology;  Laterality: N/A;   CYSTOSCOPY WITH INDOCYANINE GREEN IMAGING (ICG) Bilateral 02/10/2024   Procedure: CYSTOSCOPY WITH INDOCYANINE GREEN IMAGING (ICG);  Surgeon: Cam Morene ORN, MD;  Location: [facility] ORS;  Service: Urology;  Laterality: Bilateral;   DILATION AND CURETTAGE OF UTERUS     ESOPHAGOGASTRODUODENOSCOPY (EGD) WITH PROPOFOL  N/A 12/24/2022   Procedure: ESOPHAGOGASTRODUODENOSCOPY (EGD) WITH PROPOFOL ;  Surgeon: Leigh Elspeth SQUIBB, MD;  Location: [facility] ENDOSCOPY;  Service: Gastroenterology;  Laterality: N/A;   LAPAROSCOPIC TUBAL LIGATION Bilateral 12/08/2018   Procedure: LAPAROSCOPIC TUBAL LIGATION;  Surgeon: Alger Gong, MD;  Location: [facility];  Service: Gynecology;  Laterality: Bilateral;   LEEP N/A 03/18/2022   Procedure: LOOP ELECTROSURGICAL EXCISION PROCEDURE (LEEP);  Surgeon: Alger Gong, MD;  Location: [facility] OR;  Service: Gynecology;  Laterality: N/A;   MINI-LAPAROTOMY     after hysterectomy   POLYPECTOMY  12/12/2023   Procedure: POLYPECTOMY, INTESTINE;  Surgeon: Federico Rosario BROCKS, MD;  Location: [facility] ENDOSCOPY;  Service: Gastroenterology;;   TUBAL LIGATION     VAGINAL HYSTERECTOMY N/A 12/16/2022   Procedure: TOTAL VAGINAL HYSTERECTOMY, BILATERAL SALPINGECTOMY;  Surgeon: Lorence Ozell CROME, MD;  Location: [facility] OR;  Service: Gynecology;  Laterality: N/A;   VAGINECTOMY, PARTIAL N/A 02/10/2024   Procedure: UPPER VAGINECTOMY, LEFT PARAMETRECTOMY, LEFT URETEROLYSIS, OVARIAN TRANSPOSITION;  Surgeon: [name] Comer SAUNDERS, MD;  Location: [facility] ORS;  Service: Gynecology;  Laterality: N/A;  ROBOTIC  ASSISTED UPPER  VAGINECTOMY   WISDOM TOOTH EXTRACTION  04/21/2009    FAMILY HISTORY:  Family History  Problem Relation Age of Onset   Diabetes Mother    Stroke Mother    Colon cancer Father 15   Cancer Father    Hypertension Maternal Grandmother    Diabetes Maternal Grandmother    Hypertension Paternal Grandmother    Esophageal cancer Neg Hx    Liver disease Neg Hx     SOCIAL HISTORY:  Social History   Tobacco Use   Smoking status: Former    Current packs/day: 0.25    Average packs/day: 0.3 packs/day for 12.2 years (3.0 ttl pk-yrs)    Types: Cigarettes    Start date: 12/21/2011    Passive exposure: Never   Smokeless tobacco: Never  Vaping Use   Vaping status: Never Used  Substance Use Topics   Alcohol use: Yes    Comment: ocassionally    Drug use: Not Currently    Types: Marijuana    Comment: Last use was in 2023    ALLERGIES:  Allergies  Allergen Reactions   Keflex  [Cephalexin ] Hives and Itching    Denies difficulty breathing    MEDICATIONS:  Current Outpatient Medications  Medication Sig Dispense Refill   acetaminophen  (TYLENOL ) 500 MG tablet Take 1,000 mg by mouth every 6 (six) hours as needed for moderate pain (pain score 4-6), fever or headache.     cefpodoxime (VANTIN) 200  MG tablet Take 1 tablet (200 mg total) by mouth 2 (two) times daily. 20 tablet 0   omeprazole  (PRILOSEC OTC) 20 MG tablet Take 20 mg by mouth daily.     oxybutynin (DITROPAN XL) 10 MG 24 hr tablet Take 1 tablet (10 mg total) by mouth at bedtime. 7 tablet 0   oxyCODONE  (OXY IR/ROXICODONE ) 5 MG immediate release tablet Take 1 tablet (5 mg total) by mouth every 4 (four) hours as needed for severe pain (pain score 7-10). For AFTER surgery only, do not take and drive 20 tablet 0   polyethylene glycol powder (GLYCOLAX /MIRALAX ) 17 GM/SCOOP powder Take 233.75 g by mouth once for 1 dose Take according to your procedure prep instructions. 238 g 0   promethazine  (PHENERGAN ) 25 MG tablet Take 1 tablet (25 mg total)  by mouth every 6 (six) hours as needed for nausea or vomiting. 30 tablet 0   No current facility-administered medications for this visit.    REVIEW OF SYSTEMS:  A 10+ POINT REVIEW OF SYSTEMS WAS OBTAINED including neurology, dermatology, psychiatry, cardiac, respiratory, lymph, extremities, GI, GU, musculoskeletal, constitutional, reproductive, HEENT. ***   PHYSICAL EXAM:  vitals were not taken for this visit.   General: Alert and oriented, in no acute distress HEENT: Head is normocephalic. Extraocular movements are intact. Oropharynx is clear. Neck: Neck is supple, no palpable cervical or supraclavicular lymphadenopathy. Heart: Regular in rate and rhythm with no murmurs, rubs, or gallops. Chest: Clear to auscultation bilaterally, with no rhonchi, wheezes, or rales. Abdomen: Soft, nontender, nondistended, with no rigidity or guarding. Extremities: No cyanosis or edema. Lymphatics: see Neck Exam Skin: No concerning lesions. Musculoskeletal: symmetric strength and muscle tone throughout. Neurologic: Cranial nerves II through XII are grossly intact. No obvious focalities. Speech is fluent. Coordination is intact. Psychiatric: Judgment and insight are intact. Affect is appropriate. ***  ECOG = ***  0 - Asymptomatic (Fully active, able to carry on all predisease activities without restriction)  1 - Symptomatic but completely ambulatory (Restricted in physically strenuous activity but ambulatory and able to carry out work of a light or sedentary nature. For example, light housework, office work)  2 - Symptomatic, <50% in bed during the day (Ambulatory and capable of all self care but unable to carry out any work activities. Up and about more than 50% of waking hours)  3 - Symptomatic, >50% in bed, but not bedbound (Capable of only limited self-care, confined to bed or chair 50% or more of waking hours)  4 - Bedbound (Completely disabled. Cannot carry on any self-care. Totally confined to  bed or chair)  5 - Death   Raylene MM, Creech RH, Tormey DC, et al. [phone]). Toxicity and response criteria of the [facility]. Am. DOROTHA Bridges. Oncol. 5 (6): 649-55  LABORATORY DATA:  Lab Results  Component Value Date   WBC 8.8 02/21/2024   HGB 12.1 02/21/2024   HCT 37.8 02/21/2024   MCV 94.5 02/21/2024   PLT 523 (H) 02/21/2024   NEUTROABS 5.5 02/21/2024   Lab Results  Component Value Date   NA 143 02/21/2024   K 4.1 02/21/2024   CL 107 02/21/2024   CO2 25 02/21/2024   GLUCOSE 102 (H) 02/21/2024   BUN 11 02/21/2024   CREATININE 0.91 02/21/2024   CALCIUM 9.5 02/21/2024      RADIOGRAPHY: CT ABDOMEN PELVIS WO CONTRAST Addendum Date: 02/21/2024 ADDENDUM REPORT: 02/21/2024 17:22 ADDENDUM: Critical Value/emergent results were called by telephone at the time of interpretation on  02/21/2024 at 5:18 pm to provider KAYLA KEITH , who verbally acknowledged these results. Electronically Signed   By: Leita Birmingham M.D.   On: 02/21/2024 17:22   Result Date: 02/21/2024 CLINICAL DATA:  Abdominal/flank pain, stone suspected. Burning and pain in vaginal area and back. Vaginal surgery due to cancer on 02/10/2024. EXAM: CT ABDOMEN AND PELVIS WITHOUT CONTRAST TECHNIQUE: Multidetector CT imaging of the abdomen and pelvis was performed following the standard protocol without IV contrast. RADIATION DOSE REDUCTION: This exam was performed according to the departmental dose-optimization program which includes automated exposure control, adjustment of the mA and/or kV according to patient size and/or use of iterative reconstruction technique. COMPARISON:  01/28/2024. FINDINGS: Lower chest: Mild atelectasis is present at the lung bases. Hepatobiliary: No focal liver abnormality is seen. No gallstones, gallbladder wall thickening, or biliary dilatation. Pancreas: Unremarkable. No pancreatic ductal dilatation or surrounding inflammatory changes. Spleen: Normal in size without focal abnormality.  Adrenals/Urinary Tract: The adrenal glands are within normal limits. No renal calculus or hydronephrosis. Bladder wall thickening is noted posteriorly which may be due to associated postsurgical inflammatory changes. Stomach/Bowel: The stomach is within normal limits. No bowel obstruction or pneumatosis is seen. A small amount of free air is noted in the right upper quadrant. A moderate amount of retained stools present in the colon. Appendix appears normal. Vascular/Lymphatic: No significant vascular findings are present. No enlarged abdominal or pelvic lymph nodes. Reproductive: The uterus is surgically absent and fat stranding and a small amount of free fluid are noted in the surgical bed. There are a soft tissue structures in the pericolic gutters bilaterally with associated surgical clips, query ovaries. Other: Surgical changes are noted in the anterior abdominal wall. Subcutaneous air is present in the anterior abdominal wall on the right. Musculoskeletal: There is sclerosis at the sacroiliac joints bilaterally, compatible with sacroiliitis. No acute osseous abnormality is seen. IMPRESSION: 1. Postsurgical changes in the pelvis with surrounding inflammatory changes and small amount of free fluid. A small amount of free air is noted in the right upper quadrant, which may be iatrogenic. 2. Bladder wall thickening, which may be associated with local inflammatory changes versus infectious cystitis. 3. Soft tissue densities with associated surgical clips in the pericolic gutters bilaterally, new from the previous exam and likely postsurgical. Electronically Signed: By: Leita Birmingham M.D. On: 02/21/2024 17:12   NM PET Image Restage (PS) Skull Base to Thigh (F-18 FDG) Result Date: 02/10/2024 EXAM: PET AND CT SKULL BASE TO MID THIGH 02/08/2024 10:00:34 AM TECHNIQUE: RADIOPHARMACEUTICAL: 10.9 mCi F-18 FDG Uptake time 60 minutes. Glucose level 93 mg/dl. PET imaging was acquired from the base of the skull to the mid  thighs. Non-contrast enhanced computed tomography was obtained for attenuation correction and anatomic localization. COMPARISON: CT 01/28/2024 MRI pelvis 02/08/2024. CLINICAL HISTORY: Cervical cancer, monitor. 10.90 mCi F18 FDG IV R FA (placed by IV team) @ 0848 / HJ; FBG = 93 mg/dl ; RESTAGING FOR MALIGNANT NEOPLASM OF CERVIX ; Vaginal cuff bx 01/21/2024 ; EOV. FINDINGS: HEAD AND NECK: No metabolically active cervical lymphadenopathy. CHEST: No metabolically active pulmonary nodules. No metabolically active lymphadenopathy. ABDOMEN AND PELVIS: Mild asymmetry of metabolic activity along the left side of the vaginal cuff with SUV max equals 4.6 on image 177. This is in the vicinity of the soft tissue thickening on the comparison pelvic MRI. No hypermetabolic pelvic lymph nodes. No evidence of metastatic adenopathy outside of the pelvis. No visceral metastasis. Physiologic activity within the gastrointestinal and genitourinary systems. BONES  AND SOFT TISSUE: No skeletal metastasis. No abnormal FDG activity localizes to the bones. No metabolically active aggressive osseous lesion. IMPRESSION: 1. Mild asymmetric hypermetabolism along the left vaginal cuff (SUV max 4.6) corresponding to soft tissue thickening on prior pelvic MRI. 2. No hypermetabolic pelvic lymphadenopathy. 3. No evidence of extra-pelvic nodal, visceral, or skeletal metastasis. Electronically signed by: Norleen Boxer MD 02/10/2024 10:19 AM EDT RP Workstation: [id number]   MR Hip Right w/o contrast Result Date: 02/09/2024 MR HIP WITHOUT IV CONTRAST COMPARISON: None. CLINICAL HISTORY: Labral tear. Right hip pain. PULSE SEQUENCES: AX T1, Ax T2 FS, Cor T1, COR STIR & SMALL FOV COR PD FS with contrast. FINDINGS: Bones and labrum: There is no fracture or contusion pattern. No accelerated arthrosis is present. No subchondral reactive edema or significant joint effusion is present. Mild degenerative changes in the labrum without evidence of discrete labral  tear. Pelvic ring is intact. There is mild sclerosis in the SI joints likely related to chronic osteitis. There is no edema or joint fluid present. Sacrum is unremarkable. Musculotendinous structures: There is mild insertional tendinosis of the gluteus medius and minimus tendons. No bursal collection. Limited evaluation of the intrapelvic structures demonstrate no significant abnormality. IMPRESSION: Mild degenerative changes in the right hip with slight degenerative changes in the labrum. No discrete labral tear. No accelerated arthrosis or acute abnormality. Mild sclerosis in the iliac wing suggesting osteitis. Otherwise, unremarkable MRI of the right hip. Mild edema in the contralateral left inguinal region with multiple small lymph nodes. Correlation with PET/CT recommended. Mild fluid and lymph nodes are seen in the left intrapelvic region. Electronically signed by: Norleen Satchel MD 02/09/2024 05:32 PM EDT RP Workstation: [id number]   MR Pelvis W Wo Contrast Result Date: 02/09/2024 CLINICAL DATA:  History of cervical cancer, recurrence suspected on vaginal biopsy. EXAM: MRI PELVIS WITHOUT AND WITH CONTRAST TECHNIQUE: Multiplanar multisequence MR imaging of the pelvis was performed both before and after administration of intravenous contrast. CONTRAST:  10mL GADAVIST  GADOBUTROL  1 MMOL/ML IV SOLN COMPARISON:  Multiple priors including CT January 28, 2024 and PET-CT November 16, 2023 and MRI pelvis November 01, 2023. FINDINGS: Urinary Tract: Urinary bladder is unremarkable for degree of distension. Bowel: No evidence of bowel obstruction. Large volume of stool in the rectum. Vascular/Lymphatic: No pathologically enlarged pelvic lymph nodes. Reproductive: Uterus is surgically absent. Increased size of the T2 hyperintense asymmetric nodular focus along the left vaginal cuff now measuring 2.8 x 1.3 cm on image 25/3 previously 1.6 x 1.3 cm. This demonstrates enhancement with restricted diffusion on image 33/4 and reduced  ADC signal on image 33/5 Dominant follicle in the right ovary with the additional small follicles in the bilateral ovaries. Other:  No significant abdominal free fluid. Musculoskeletal: No aggressive osseous lesion. IMPRESSION: 1. Increased size of the T2 hyperintense asymmetric nodular focus along the left vaginal cuff now measuring 2.8 x 1.3 cm which demonstrates enhancement with increased DWI signal and reduced ADC signal. Findings are compatible with given history of suspected recurrence on biopsy. 2. No evidence of pelvic adenopathy. Electronically Signed   By: Reyes Holder M.D.   On: 02/09/2024 07:57   CT ABDOMEN PELVIS W CONTRAST Result Date: 01/28/2024 CLINICAL DATA:  Diverticulitis with complication suspected. Left lower quadrant abdominal pain starting 2 hours ago. Nausea and vomiting today. EXAM: CT ABDOMEN AND PELVIS WITH CONTRAST TECHNIQUE: Multidetector CT imaging of the abdomen and pelvis was performed using the standard protocol following bolus administration of intravenous contrast. RADIATION DOSE REDUCTION: This exam  was performed according to the departmental dose-optimization program which includes automated exposure control, adjustment of the mA and/or kV according to patient size and/or use of iterative reconstruction technique. CONTRAST:  OMNIPAQUE  IOHEXOL  300 MG/ML  SOLN COMPARISON:  12/08/2023 FINDINGS: Lower chest: Lung bases are clear. Hepatobiliary: Mild diffuse fatty infiltration. No focal liver lesions. Gallbladder and bile ducts are normal. Pancreas: Unremarkable. No pancreatic ductal dilatation or surrounding inflammatory changes. Spleen: Normal in size without focal abnormality. Adrenals/Urinary Tract: Adrenal glands are unremarkable. Kidneys are normal, without renal calculi, focal lesion, or hydronephrosis. Bladder is unremarkable. Stomach/Bowel: Stomach, small bowel, and colon are not abnormally distended. Under distention of the colon limits evaluation but there  appears to be diffuse colonic wall thickening and edema likely indicating colitis. This could represent infectious or inflammatory colitis. Similar appearance to previous study. No pericolonic abscess. Appendix is normal. Vascular/Lymphatic: No significant vascular findings are present. No enlarged abdominal or pelvic lymph nodes. Reproductive: Uterus is surgically absent.  No adnexal masses. Other: No free air or free fluid in the abdomen. Abdominal wall musculature appears intact. Musculoskeletal: No acute or significant osseous findings. IMPRESSION: 1. Diffuse colonic wall thickening likely representing infectious or inflammatory colitis. Similar appearance to previous study. No bowel obstruction. 2. Mild diffuse fatty infiltration of the liver. Electronically Signed   By: Elsie Gravely M.D.   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  We discussed the natural history of *** and general treatment, highlighting the role of radiotherapy in the management.  We discussed the available radiation techniques, and focused on the details of logistics and delivery.  We reviewed the anticipated acute and late sequelae associated with radiation in this setting.  The patient was encouraged to ask questions that I answered to the best of my ability. *** A patient consent form was discussed and signed.  We retained a copy for our records.  The patient would like to proceed with radiation and will be scheduled for CT simulation.  PLAN: ***    *** minutes of total time was spent for this patient encounter, including preparation, face-to-face counseling with the patient and coordination of care, physical exam, and documentation of the encounter.   ------------------------------------------------  Lynwood CHARM Nasuti, PhD, MD  This document serves as a record of services personally  performed by Lynwood Nasuti, MD. It was created on his behalf by Reymundo Cartwright, a trained medical scribe. The creation of this record is based on the scribe's personal observations and the provider's statements to them. This document has been checked and approved by the attending provider.

## 2024-02-24 ENCOUNTER — Ambulatory Visit
Admission: RE | Admit: 2024-02-24 | Discharge: 2024-02-24 | Disposition: A | Discharge status: Home / Self Care | Source: Ambulatory Visit | Attending: Radiation Oncology | Admitting: Radiation Oncology

## 2024-02-24 ENCOUNTER — Encounter: Payer: Self-pay | Admitting: Radiation Oncology

## 2024-02-24 ENCOUNTER — Other Ambulatory Visit (HOSPITAL_COMMUNITY): Payer: Self-pay

## 2024-02-24 VITALS — BP 123/88 | HR 86 | Temp 97.3°F | Resp 20 | Ht 71.0 in | Wt 219.0 lb

## 2024-02-24 VITALS — BP 123/88 | HR 86 | Resp 20 | Wt 219.0 lb

## 2024-02-24 DIAGNOSIS — I739 Peripheral vascular disease, unspecified: Secondary | ICD-10-CM | POA: Diagnosis not present

## 2024-02-24 DIAGNOSIS — K760 Fatty (change of) liver, not elsewhere classified: Secondary | ICD-10-CM | POA: Diagnosis not present

## 2024-02-24 DIAGNOSIS — J9811 Atelectasis: Secondary | ICD-10-CM | POA: Diagnosis not present

## 2024-02-24 DIAGNOSIS — N83209 Unspecified ovarian cyst, unspecified side: Secondary | ICD-10-CM | POA: Diagnosis not present

## 2024-02-24 DIAGNOSIS — Z87891 Personal history of nicotine dependence: Secondary | ICD-10-CM | POA: Diagnosis not present

## 2024-02-24 DIAGNOSIS — G4730 Sleep apnea, unspecified: Secondary | ICD-10-CM | POA: Insufficient documentation

## 2024-02-24 DIAGNOSIS — Z87442 Personal history of urinary calculi: Secondary | ICD-10-CM | POA: Insufficient documentation

## 2024-02-24 DIAGNOSIS — K219 Gastro-esophageal reflux disease without esophagitis: Secondary | ICD-10-CM | POA: Diagnosis not present

## 2024-02-24 DIAGNOSIS — Z800 Family history of malignant neoplasm of digestive organs: Secondary | ICD-10-CM | POA: Insufficient documentation

## 2024-02-24 DIAGNOSIS — Z79899 Other long term (current) drug therapy: Secondary | ICD-10-CM | POA: Insufficient documentation

## 2024-02-24 DIAGNOSIS — K529 Noninfective gastroenteritis and colitis, unspecified: Secondary | ICD-10-CM | POA: Diagnosis not present

## 2024-02-24 DIAGNOSIS — C52 Malignant neoplasm of vagina: Secondary | ICD-10-CM | POA: Insufficient documentation

## 2024-02-25 LAB — MOLECULAR PATHOLOGY

## 2024-03-01 ENCOUNTER — Other Ambulatory Visit (HOSPITAL_COMMUNITY): Payer: Self-pay

## 2024-03-08 ENCOUNTER — Ambulatory Visit (HOSPITAL_COMMUNITY)

## 2024-03-09 ENCOUNTER — Other Ambulatory Visit (HOSPITAL_COMMUNITY): Payer: Self-pay

## 2024-03-10 DIAGNOSIS — H5213 Myopia, bilateral: Secondary | ICD-10-CM | POA: Diagnosis not present

## 2024-03-10 DIAGNOSIS — H52223 Regular astigmatism, bilateral: Secondary | ICD-10-CM | POA: Diagnosis not present

## 2024-03-11 ENCOUNTER — Ambulatory Visit (HOSPITAL_BASED_OUTPATIENT_CLINIC_OR_DEPARTMENT_OTHER): Admitting: Orthopaedic Surgery

## 2024-03-11 ENCOUNTER — Encounter: Payer: Self-pay | Admitting: Gynecologic Oncology

## 2024-03-11 ENCOUNTER — Other Ambulatory Visit: Payer: Self-pay | Admitting: Hematology and Oncology

## 2024-03-11 ENCOUNTER — Inpatient Hospital Stay (HOSPITAL_BASED_OUTPATIENT_CLINIC_OR_DEPARTMENT_OTHER): Admitting: Gynecologic Oncology

## 2024-03-11 ENCOUNTER — Other Ambulatory Visit (HOSPITAL_COMMUNITY): Payer: Self-pay

## 2024-03-11 ENCOUNTER — Encounter: Payer: Self-pay | Admitting: Licensed Clinical Social Worker

## 2024-03-11 ENCOUNTER — Telehealth: Payer: Self-pay | Admitting: Oncology

## 2024-03-11 ENCOUNTER — Ambulatory Visit (HOSPITAL_BASED_OUTPATIENT_CLINIC_OR_DEPARTMENT_OTHER): Payer: Self-pay

## 2024-03-11 VITALS — BP 121/60 | HR 66 | Temp 98.6°F | Resp 19 | Wt 236.4 lb

## 2024-03-11 DIAGNOSIS — Z9079 Acquired absence of other genital organ(s): Secondary | ICD-10-CM

## 2024-03-11 DIAGNOSIS — K5900 Constipation, unspecified: Secondary | ICD-10-CM

## 2024-03-11 DIAGNOSIS — C539 Malignant neoplasm of cervix uteri, unspecified: Secondary | ICD-10-CM

## 2024-03-11 DIAGNOSIS — C531 Malignant neoplasm of exocervix: Secondary | ICD-10-CM

## 2024-03-11 DIAGNOSIS — M25551 Pain in right hip: Secondary | ICD-10-CM

## 2024-03-11 DIAGNOSIS — N3289 Other specified disorders of bladder: Secondary | ICD-10-CM

## 2024-03-11 MED ORDER — LIDOCAINE HCL 1 % IJ SOLN
4.0000 mL | INTRAMUSCULAR | Status: AC | PRN
  Administered 2024-03-11: 4 mL

## 2024-03-11 MED ORDER — TRIAMCINOLONE ACETONIDE 40 MG/ML IJ SUSP
80.0000 mg | INTRAMUSCULAR | Status: AC | PRN
  Administered 2024-03-11: 80 mg via INTRA_ARTICULAR

## 2024-03-11 MED ORDER — PHENAZOPYRIDINE HCL 100 MG PO TABS
100.0000 mg | ORAL_TABLET | Freq: Three times a day (TID) | ORAL | 0 refills | Status: AC | PRN
  Filled 2024-03-11: qty 12, 4d supply, fill #0

## 2024-03-11 NOTE — Progress Notes (Signed)
 Chief Complaint: Right hip pain     History of Present Illness:    Holly Brown is a 38 y.o. female presents with ongoing right hip pain for over 1 year although she has been having pain really since the birth of her third child.  She is experiencing popping and clicking in the hip.  She has worked with physical therapy for the last 6 weeks.  She does have a history of cervical cancer for which she is currently undergoing chemoradiation.  She is being impacted through most activities including work.  She works here at [employer].    PMH/PSH/Family History/Social History/Meds/Allergies:    Past Medical History:  Diagnosis Date  . Anxiety   . Cancer (HCC)    cervical cancer  . Depression   . Fatty liver   . GERD (gastroesophageal reflux disease)   . History of hiatal hernia   . History of UTI   . HSV (herpes simplex virus) infection   . Migraines   . Peripheral vascular disease   . Post-operative nausea and vomiting 12/19/2022  . Seizures (HCC)    Jan.2016 - only had one seizure, none since  . Sleep apnea    no CPAP  . Vaginal Pap smear, abnormal    Past Surgical History:  Procedure Laterality Date  . BIOPSY  12/24/2022   Procedure: BIOPSY;  Surgeon: Leigh Elspeth SQUIBB, MD;  Location: [facility] END[facility]COPY;  Service: Gastroenterology;;  . BONE BIOPSY  12/12/2023   Procedure: BIOPSY, GI;  Surgeon: Federico Rosario BROCKS, MD;  Location: [facility] ENDOSCOPY;  Service: Gastroenterology;;  . COLONOSCOPY N/A 12/12/2023   Procedure: COLONOSCOPY;  Surgeon: Federico Rosario BROCKS, MD;  Location: [facility] ENDOSCOPY;  Service: Gastroenterology;  Laterality: N/A;  . CYSTOSCOPY WITH INDOCYANINE GREEN  IMAGING (ICG) Bilateral 02/10/2024   Procedure: CYSTOSCOPY WITH INDOCYANINE GREEN  IMAGING (ICG);  Surgeon: Cam Morene ORN, MD;  Location: [facility] ORS;  Service: Urology;  Laterality: Bilateral;  . DILATION AND CURETTAGE OF UTERUS    . ESOPHAGOGASTRODUODENOSCOPY (EGD) WITH PROPOFOL  N/A 12/24/2022   Procedure:  ESOPHAGOGASTRODUODENOSCOPY (EGD) WITH PROPOFOL ;  Surgeon: Leigh Elspeth SQUIBB, MD;  Location: [facility] ENDOSCOPY;  Service: Gastroenterology;  Laterality: N/A;  . LAPAROSCOPIC TUBAL LIGATION Bilateral 12/08/2018   Procedure: LAPAROSCOPIC TUBAL LIGATION;  Surgeon: Alger Gong, MD;  Location: [facility];  Service: Gynecology;  Laterality: Bilateral;  . LEEP N/A 03/18/2022   Procedure: LOOP ELECTROSURGICAL EXCISION PROCEDURE (LEEP);  Surgeon: Alger Gong, MD;  Location: [facility] OR;  Service: Gynecology;  Laterality: N/A;  . MINI-LAPAROTOMY     after hysterectomy  . POLYPECTOMY  12/12/2023   Procedure: POLYPECTOMY, INTESTINE;  Surgeon: Federico Rosario BROCKS, MD;  Location: [facility] ENDOSCOPY;  Service: Gastroenterology;;  . TUBAL LIGATION    . VAGINAL HYSTERECTOMY N/A 12/16/2022   Procedure: TOTAL VAGINAL HYSTERECTOMY, BILATERAL SALPINGECTOMY;  Surgeon: Lorence Ozell CROME, MD;  Location: [facility] OR;  Service: Gynecology;  Laterality: N/A;  . VAGINECTOMY, PARTIAL N/A 02/10/2024   Procedure: UPPER VAGINECTOMY, LEFT PARAMETRECTOMY, LEFT URETEROLYSIS, OVARIAN TRANSPOSITION;  Surgeon: [name] Comer SAUNDERS, MD;  Location: [facility] ORS;  Service: Gynecology;  Laterality: N/A;  ROBOTIC  ASSISTED UPPER VAGINECTOMY  . WISDOM TOOTH EXTRACTION  04/21/2009   Social History   Socioeconomic History  . Marital status: Single    Spouse name: Not on file  . Number of children: 4  . Years of education: college  . Highest education level: Some college, no degree  Occupational History    Employer: [employer]    Comment:  service desk analyst  Tobacco Use  . Smoking status: Former    Current packs/day: 0.25    Average packs/day: 0.3 packs/day for 12.2 years (3.1 ttl pk-yrs)    Types: Cigarettes    Start date: 12/21/2011    Passive exposure: Never  . Smokeless tobacco: Never  Vaping Use  . Vaping status: Never Used  Substance and Sexual Activity  . Alcohol use: Yes    Comment: ocassionally   . Drug use: Not Currently     Types: Marijuana    Comment: Last use was in 2023  . Sexual activity: Not Currently    Partners: Male    Birth control/protection: Surgical    Comment: Tubal Ligation  Other Topics Concern  . Not on file  Social History Narrative   Lives with her grandmother and her three children   Does not drink caffeine    Social Drivers of Corporate Investment Banker Strain: Low Risk  (10/16/2023)   Overall Financial Resource Strain (CARDIA)   . Difficulty of Paying Living Expenses: Not hard at all  Food Insecurity: Food Insecurity Present (03/11/2024)   Hunger Vital Sign   . Worried About Programme Researcher, Broadcasting/film/video in the Last Year: Sometimes true   . Ran Out of Food in the Last Year: Never true  Transportation Needs: No Transportation Needs (02/18/2024)   PRAPARE - Transportation   . Lack of Transportation (Medical): No   . Lack of Transportation (Non-Medical): No  Physical Activity: Sufficiently Active (10/16/2023)   Exercise Vital Sign   . Days of Exercise per Week: 4 days   . Minutes of Exercise per Session: 120 min  Stress: Stress Concern Present (10/16/2023)   [employer] of Occupational Health - Occupational Stress Questionnaire   . Feeling of Stress: To some extent  Social Connections: Moderately Isolated (10/16/2023)   Social Connection and Isolation Panel   . Frequency of Communication with Friends and Family: More than three times a week   . Frequency of Social Gatherings with Friends and Family: Patient declined   . Attends Religious Services: 1 to 4 times per year   . Active Member of Clubs or Organizations: No   . Attends Banker Meetings: Not on file   . Marital Status: Never married   Family History  Problem Relation Age of Onset  . Diabetes Mother   . Stroke Mother   . Colon cancer Father 26  . Cancer Father   . Hypertension Maternal Grandmother   . Diabetes Maternal Grandmother   . Hypertension Paternal Grandmother   . Esophageal cancer Neg Hx   .  Liver disease Neg Hx    Allergies  Allergen Reactions  . Keflex  [Cephalexin ] Hives and Itching    Denies difficulty breathing   Current Outpatient Medications  Medication Sig Dispense Refill  . acetaminophen  (TYLENOL ) 500 MG tablet Take 1,000 mg by mouth every 6 (six) hours as needed for moderate pain (pain score 4-6), fever or headache.    . omeprazole  (PRILOSEC OTC) 20 MG tablet Take 20 mg by mouth daily.    . oxybutynin  (DITROPAN  XL) 10 MG 24 hr tablet Take 1 tablet (10 mg total) by mouth at bedtime. 7 tablet 0  . polyethylene glycol powder (GLYCOLAX /MIRALAX ) 17 GM/SCOOP powder Take 233.75 g by mouth once for 1 dose Take according to your procedure prep instructions. 238 g 0  . promethazine  (PHENERGAN ) 25 MG tablet Take 1 tablet (25 mg total) by mouth every 6 (six) hours  as needed for nausea or vomiting. 30 tablet 0   No current facility-administered medications for this visit.   No results found.  Review of Systems:   A ROS was performed including pertinent positives and negatives as documented in the HPI.  Physical Exam :   Constitutional: NAD and appears stated age Neurological: Alert and oriented Psych: Appropriate affect and cooperative There were no vitals taken for this visit.   Comprehensive Musculoskeletal Exam:    Right hip with tenderness about the femoral acetabular joint crease with a positive FADIR and 90 degrees of flexion 20 degrees internal rotation which improved with 30 degrees external rotation excellent abduction strength with a nonantalgic gait   Imaging:   Xray (3 views right hip): Normal  MRI (right hip): Anterior superior labral tear   I personally reviewed and interpreted the radiographs.   Assessment and Plan:   38 y.o. female with right hip anterior superior labral tear.  At this time she has trialed physical therapy.  I would like to get her some relief and as result I have recommended ultrasound-guided injection of the right femoral  acetabular joint.  She is currently undergoing treatment with chemoradiation for her cervical cancer and given that I would recommend starting with a shot and to see how she progresses with this.  I will plan to check on her in 3 months to assess how she is doing from a cancer perspective  -Right hip ultrasound-guided injection provided after verbal consent obtained     Procedure Note  Patient: CAITLYN BUCHANAN             Date of Birth: 03/21/1986           MRN: [id number]             Visit Date: 03/11/2024  Procedures: Visit Diagnoses:  1. Pain of right hip     Large Joint Inj: R hip joint on 03/11/2024 12:32 PM Indications: pain Details: 22 G 3.5 in needle, ultrasound-guided anterolateral approach  Arthrogram: No  Medications: 4 mL lidocaine  1 %; 80 mg triamcinolone  acetonide 40 MG/ML Outcome: tolerated well, no immediate complications Procedure, treatment alternatives, risks and benefits explained, specific risks discussed. Consent was given by the patient. Immediately prior to procedure a time out was called to verify the correct patient, procedure, equipment, support staff and site/side marked as required. Patient was prepped and draped in the usual sterile fashion.          I personally saw and evaluated the patient, and participated in the management and treatment plan.  Elspeth Parker, MD Attending Physician, Orthopedic Surgery  This document was dictated using Dragon voice recognition software. A reasonable attempt at proof reading has been made to minimize errors.

## 2024-03-11 NOTE — Patient Instructions (Signed)
 It was good to see you today.  You are healing very well from surgery.  Please remember, no heavy lifting for 6 weeks and nothing in the vagina for at least 12 weeks.  Please use a little bit of MiraLAX  daily to see if we can get your bowels moving better.  I am sending in a prescription for Pyridium  to the pharmacy for you to use for several days to see if this helps with your bladder spasms.

## 2024-03-11 NOTE — Telephone Encounter (Signed)
 Called Holly Brown and scheduled appointments on 03/31/2024 for port flush/labs at 2:45, Dr. Lonn at 3:20 and Patient education at 4:00.  Discussed starting chemo on 04/04/24.  She is working and is wondering if she can start on a Friday.  She would like 04/01/2024 if possible. Advised a scheduler will be calling with the appointment time.

## 2024-03-11 NOTE — Progress Notes (Signed)
 Provided bag from [facility] food pantry due to pt expressed food insecurity. Pt stated that additional community food pantries list has been helpful as well.   Holly Conover E Miguelina Fore, LCSW

## 2024-03-11 NOTE — Progress Notes (Signed)
 Gynecologic Oncology Return Clinic Visit  03/11/24  Reason for Visit: follow-up, treatment planning  Treatment History: Oncology History Overview Note  PD-L1 0% from 12/16/22 PD-L1 CPS 10% from 02/10/2024 surgery   Cervical cancer (HCC)  12/18/2015 Pathology Results   Endocervix, curettage -KOILOCYTIC ATYPIA CONSISTENT WITH HUMAN PAPILLOMAVIRUS (HPV) EFFECT. -BENIGN ENDOCERVICAL GLANDS. 2. Cervix, biopsy -CERVICAL TRANSFORMATION ZONE MUCOSA WITH CIN-I/II (MILD - MODERATE SQUAMOUS DYSPLASIA; LOW - HIGH GRADE SQUAMOUS INTRAEPITHELIAL LESION). -INVOLVING ENDOCERVICAL GLANDS.   11/08/2020 Pathology Results   HIGH RISK HPV ([place]): Positive Abnormal  YPV 16 ([place]): Positive Abnormal  HPV 18 / 45 ([place]): Negative ADEQUACY: Satisfactory for evaluation; transformation zone component PRESENT. DIAGNOSIS: - High grade squamous intraepithelial lesion (HSIL) Abnormal  MICROORGANISMS - CYTOLOGY: Trichomonas vaginalis present COMMENT (MOLECULAR): Normal Reference Range HPV - Negative COMMENT (MOLECULAR): Normal Reference Range HPV 16 18 45 -Negative       05/21/2021 Pathology Results   SURGICAL PATHOLOGY  CASE: [id number]  PATIENT: Holly Brown  Surgical Pathology Report  Clinical History: HGSIL (cm)   FINAL MICROSCOPIC DIAGNOSIS:   A. ENDOCERVIX, CURETTAGE:  -  High-grade squamous intraepithelial lesion (CIN 2-3; moderate to severe dysplasia)   B. CERVIX, BIOPSY:  -  High-grade squamous intraepithelial lesion (CIN 2-3; moderate to severe dysplasia), extending into endocervical glands     03/18/2022 Pathology Results   SURGICAL PATHOLOGY  CASE: [id number]  PATIENT: Holly Brown  Surgical Pathology Report   Clinical History: HGSIL (cm)   FINAL MICROSCOPIC DIAGNOSIS:   A. CERVIX, CONIZATION:  - High-grade squamous intraepithelial lesion (CIN2-3, high grade dysplasia) with extension into underlying endocervical glands  - Negative for invasive  carcinoma  - The endocervical margin is positive for high-grade dysplasia    03/18/2022 Surgery   PREOPERATIVE DIAGNOSIS: HGSIL/CIN 2 on pap smear and colposcopy POSTOPERATIVE DIAGNOSIS: The same. PROCEDURE:     Cervical cone biopsy SURGEON:  Dr. Winton Felt   11/19/2022 Pathology Results   SURGICAL PATHOLOGY  CASE: [id number]  PATIENT: Holly Brown  Surgical Pathology Report   Clinical History: HGSIL, HPV + (cm)   FINAL MICROSCOPIC DIAGNOSIS:   A. CERVIX, 12 AND 6 O'CLOCK, BIOPSY:  - Low-grade squamous intraepithelial lesion (L SIL/CIN-1)   B. ENDOCERVIX, CURETTAGE:  - High-grade squamous intraepithelial lesion (HSIL/CIN-1 2-3).  See comment.   COMMENT:   - Immunohistochemical stain p16 shows strong diffuse staining consistent with high-grade squamous intraepithelial lesion.  However distinction between CIN-2 and CIN-3 is limited by unoriented fragments in the biopsy.    12/16/2022 Pathology Results   SURGICAL PATHOLOGY  CASE: [id number]  PATIENT: Holly Brown  Surgical Pathology Report   Clinical History: AUB (cm)   FINAL MICROSCOPIC DIAGNOSIS:   A. UTERUS AND CERVIX, WITH BILATERAL FALLOPIAN TUBES, HYSTERECTOMY:  - Invasive moderately differentiated squamous cell carcinoma, see  comment  - Carcinoma invades for a depth of about 0.5 cm  - No evidence of lymphovascular invasion  - Resection margins are negative for carcinoma  - Uterus with benign weakly proliferative endometrium  - Adenomyosis  - Benign unremarkable bilateral fallopian tubes  - See oncology table     12/16/2022 Surgery   PROCEDURE DATE: 12/16/2022   PREOPERATIVE DIAGNOSIS:  Symptomatic fibroids, menorrhagia POSTOPERATIVE DIAGNOSIS:  Symptomatic fibroids, menorrhagia SURGEON:   Ozell Cowman, M.D. CHRISTOPERBETHA RONAL Buddle, M.D.   An experienced assistant was required given the standard of surgical care given the complexity of the case.  This assistant was needed for exposure,  dissection, suctioning, retraction, instrument exchange, and  for overall help during the procedure.     OPERATION:  Total Vaginal hysterectomy with bilateral salpingectomy ANESTHESIA:  General endotracheal.   12/29/2022 Initial Diagnosis   Cervical cancer (HCC)   02/11/2023 Pathology Results   SURGICAL PATHOLOGY  CASE: [id number]  PATIENT: Holly Brown  Surgical Pathology Report   Clinical History: cervical cancer   FINAL MICROSCOPIC DIAGNOSIS:   A. RIGHT PELVIC LYMPH NODE, EXCISION:  Four benign lymph nodes, negative for carcinoma (0/4)   B. 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  No nodular areas.  On intra-abdominal entry, normal upper abdominal survey.  No ascites.  Omentum with filmy adhesions to the vaginal cuff.  Bilateral ovaries adherent to the cuff with rectum pulled up posteriorly, also adherent to the ovaries and the posterior aspect of the cuff.  No significant hematoma noted.  No appreciable adenopathy.   11/01/2023 Imaging   MRI pelvis 1. Nodular focus of enhancing slightly T2 hyperintense soft tissue along the left side of the vaginal cuff which restricts diffusion measuring 16 x 13 mm, nonspecific but suspicious for local recurrence. Suggest further evaluation with direct visualization and tissue sampling. 2. Intrinsically T1 hyperintense left ovarian lesion measuring 2.4 cm demonstrates internal layering complexity adherent to the dependent wall favored a hemorrhagic  cyst with adherent clot. Suggest follow-up pelvic ultrasound in 6-12 weeks to ensure resolution. 3. Mild persistent wall thickening of visualized portions of colon, suggestive of colitis. 4. Symmetric wall thickening of a nondistended urinary bladder, which may be due to underdistention or cystitis.   11/16/2023 PET scan    No areas of abnormal radiotracer uptake at this time suggest soft tissue or lymph node metastatic disease. Specifically the nodular focus along the left side of the vaginal cuff on the previous MRI does not clearly show abnormal radiotracer uptake today. Please correlate with clinical findings and direct visualization.   There is significant uptake within the previously described complex left-sided ovarian lesion. Again this could be a functional cyst although with this level uptake would recommend short follow up ultrasound to confirm resolution in 6 weeks.   Scattered brown fat uptake.   Mild uptake with opacity along left maxillary sinus. Please correlate for any specific symptoms and history.   01/21/2024 Pathology Results   HIGH RISK HPV ([place]): Positive Abnormal  YPV 16 ([place]): Positive Abnormal  HPV 18 / 45 ([place]): Negative ADEQUACY: Satisfactory for evaluation; transformation zone component ABSENT. DIAGNOSIS: - High grade squamous intraepithelial lesion (HSIL) Abnormal  COMMENT - CYTOLOGY: There are a few cells suggesting a possible higher grade lesion. COMMENT (MOLECULAR): Normal Reference Range HPV - Negative COMMENT (MOLECULAR): Normal Reference Range HPV 16- Negative COMMENT (MOLECULAR): Normal Reference Range HPV 16 18 45 -Negative   01/28/2024 Imaging   Ct abdomen and pelvis 1. Diffuse colonic wall thickening likely representing infectious or inflammatory colitis. Similar appearance to previous study. No bowel obstruction. 2. Mild diffuse fatty infiltration of the liver.     02/08/2024 PET scan   1. Mild asymmetric hypermetabolism  along  the left vaginal cuff (SUV max 4.6) corresponding to soft tissue thickening on prior pelvic MRI. 2. No hypermetabolic pelvic lymphadenopathy. 3. No evidence of extra-pelvic nodal, visceral, or skeletal metastasis.   02/08/2024 Imaging   MRI pelvis 1. Increased size of the T2 hyperintense asymmetric nodular focus along the left vaginal cuff now measuring 2.8 x 1.3 cm which demonstrates enhancement with increased DWI signal and reduced ADC signal. Findings are compatible with given history of suspected recurrence on biopsy. 2. No evidence of pelvic adenopathy.   02/10/2024 Pathology Results   SURGICAL PATHOLOGY  CASE: [id number]  PATIENT: Holly Brown  Surgical Pathology Report   Clinical History: Squamous cell carcinoma in situ at least on vaginal  biopsy, H/O cervical cancer   FINAL MICROSCOPIC DIAGNOSIS:   A. ROUND LIGAMENT, SOFT TISSUE EXCISION:  Fibromuscular connective tissue consistent with broad ligament.  Negative for malignancy.  B. UPPER VAGINA, PARTIAL VAGINECTOMY: Invasive squamous cell carcinoma, 2.5 cm (pT1b) with associated high grade squamous dysplasia. Carcinoma invades to a depth of 0.9 cm. Invasive carcinoma focally 2 mm from 3-6 o'clock soft tissue margin. Lymphovascular invasion. Focal perineural invasion. See oncology table.  ONCOLOGY TABLE: VAGINA, CARCINOMA:  Resection Procedure: Patrial vaginectomy Tumor Site: Vagina Tumor Size: 25 x 17 mm Histologic Type: Squamous cell carcinoma Histologic Grade: Moderately differentiated Sites Involved by Direct Tumor Extension: Not applicable Lymphovascular Invasion: Present Margins: All margins negative for invasive carcinoma, closest margin is 2 mm from the 3-6 o'clock soft tissue margin      Margins Involved by Invasive Carcinoma: Not applicable      Margins Status for HSIL or AIS: Negative Regional Lymph Nodes: No lymph nodes submitted Distant Metastasis:      Distant Site(s) Involved: Not  applicable Pathologic Stage Classification (pTNM, AJCC 8th Edition): pT1b, pN Not assigned Ancillary Studies: Can be performed upon request Representative Tumor Block: [id number]    02/10/2024 Surgery   OPERATIVE NOTE  Pre-operative Diagnosis: Suspected recurrent cervical cancer with at least carcinoma in situ on recent vaginal cuff biopsy   Post-operative Diagnosis: same, retroperitoneal fibrosis   Operation: Robotic-assisted upper vaginectomy, left parametrectomy, left ureterolysis, bilateral ovarian transposition Cystoscopy with ICG ureteral injection bilaterally, left ureteral stent placement (Dr. Cam)   Surgeon: [name] Crank MD    Operative Findings: On EUA, firm 2 cm area of tissue at the left aspect of the vaginal cuff.  No distinct parametrial involvement.  Rectum feels somewhat tethered.  On intra-abdominal entry, normal upper abdominal survey.  Normal small and large bowel.  Aspect of left omentum adherent to the pelvic brim on the left.  Normal-appearing bilateral ovaries with possible fallopian tube remnant versus round ligament remnant attached distally.  Fullness appreciated at the left vaginal cuff.  With manipulation of the vagina using any sizer, the left lateral parametria appeared somewhat full but unclear if tumor present.  This area was ultimately found to be separate from and not causing compression of her left ureter. Given some retroperitoneal fibrosis and desire to achieve negative margin, left ureterolysis performed down to the level of the bladder to mobilize the left parametrium and paravaginal tissue. Prior to colpotomy, a 2-0 STRATAFIX and a 2-0 V-Loc were used to create a cerclage of sorts circumferentially distal to the vaginal cuff lesion ensuring at least a 1 cm margin circumferentially.  Colpotomy was then made inferior to this cerclage using a KOH ring within the vagina as a guide.  Once the vaginal apex was removed from the vagina, cerclage was removed.  Closest margin was the left lateral margin at 1 cm.  The posterior margin was 2 cm and anteriorly was 1.5 cm.  Laterally to the right there was significant margin of normal tissue.   Bubble est performed at the end of the surgery was negative for evidence of leak.   Ovaries were pexed out of the pelvis in the event that patient requires pelvic radiation.  Radiopaque clips were used to define the distal margin of each ovary.     02/18/2024 Cancer Staging   Staging form: Cervix Uteri, AJCC Version 9 - Clinical: Stage IIIA (rcT3a, rcN0, rcM0) - Signed by Lonn Hicks, MD on 02/18/2024 Stage prefix: Recurrence     Interval History: Doing well.  Feels somewhat improved after finishing antibiotics for urinary tract infection.  Still feels what she describes as some spasms in the vagina which is more pressure or spasm that she feels at the end of her stream when she is emptying her bladder.  Continues to urinate more and empty a good amount when she does.  Has some tightness across her upper abdomen and on the right side intermittently.  Sometimes feels this when she tries to get up and if she rests for a moment this feeling passes.  Denies any vaginal bleeding.  Still struggling some with constipation.  Uses MiraLAX  as needed but can go up to 2 to 3 days without a bowel movement.  Past Medical/Surgical History: Past Medical History:  Diagnosis Date   Anxiety    Cancer (HCC)    cervical cancer   Depression    Fatty liver    GERD (gastroesophageal reflux disease)    History of hiatal hernia    History of UTI    HSV (herpes simplex virus) infection    Migraines    Peripheral vascular disease    Post-operative nausea and vomiting 12/19/2022   Seizures (HCC)    Jan.2016 - only had one seizure, none since   Sleep apnea    no CPAP   Vaginal Pap smear, abnormal     Past Surgical History:  Procedure Laterality Date   BIOPSY  12/24/2022   Procedure: BIOPSY;  Surgeon: Leigh Elspeth SQUIBB, MD;  Location: [facility] ENDOSCOPY;  Service: Gastroenterology;;   BONE BIOPSY  12/12/2023   Procedure: BIOPSY, GI;  Surgeon: Federico Rosario BROCKS, MD;  Location: [facility] ENDOSCOPY;  Service: Gastroenterology;;   COLONOSCOPY N/A 12/12/2023   Procedure: COLONOSCOPY;  Surgeon: Federico Rosario BROCKS, MD;  Location: [facility] ENDOSCOPY;  Service: Gastroenterology;  Laterality: N/A;   CYSTOSCOPY WITH INDOCYANINE GREEN  IMAGING (ICG) Bilateral 02/10/2024   Procedure: CYSTOSCOPY WITH INDOCYANINE GREEN  IMAGING (ICG);  Surgeon: Cam Morene ORN, MD;  Location: [facility] ORS;  Service: Urology;  Laterality: Bilateral;   DILATION AND CURETTAGE OF UTERUS     ESOPHAGOGASTRODUODENOSCOPY (EGD) WITH PROPOFOL  N/A 12/24/2022   Procedure: ESOPHAGOGASTRODUODENOSCOPY (EGD) WITH PROPOFOL ;  Surgeon: Leigh Elspeth SQUIBB, MD;  Location: [facility] ENDOSCOPY;  Service: Gastroenterology;  Laterality: N/A;   LAPAROSCOPIC TUBAL LIGATION Bilateral 12/08/2018   Procedure: LAPAROSCOPIC TUBAL LIGATION;  Surgeon: Alger Gong, MD;  Location: [facility];  Service: Gynecology;  Laterality: Bilateral;   LEEP N/A 03/18/2022   Procedure: LOOP ELECTROSURGICAL EXCISION PROCEDURE (LEEP);  Surgeon: Alger Gong, MD;  Location: [facility] OR;  Service: Gynecology;  Laterality: N/A;   MINI-LAPAROTOMY     after hysterectomy   POLYPECTOMY  12/12/2023   Procedure: POLYPECTOMY, INTESTINE;  Surgeon: Federico Rosario BROCKS, MD;  Location: [facility] ENDOSCOPY;  Service: Gastroenterology;;  TUBAL LIGATION     VAGINAL HYSTERECTOMY N/A 12/16/2022   Procedure: TOTAL VAGINAL HYSTERECTOMY, BILATERAL SALPINGECTOMY;  Surgeon: Lorence Ozell CROME, MD;  Location: [facility] OR;  Service: Gynecology;  Laterality: N/A;   VAGINECTOMY, PARTIAL N/A 02/10/2024   Procedure: UPPER VAGINECTOMY, LEFT PARAMETRECTOMY, LEFT URETEROLYSIS, OVARIAN TRANSPOSITION;  Surgeon: [name] Comer SAUNDERS, MD;  Location: [facility] ORS;  Service: Gynecology;  Laterality: N/A;  ROBOTIC  ASSISTED UPPER VAGINECTOMY   WISDOM TOOTH EXTRACTION   04/21/2009    Family History  Problem Relation Age of Onset   Diabetes Mother    Stroke Mother    Colon cancer Father 82   Cancer Father    Hypertension Maternal Grandmother    Diabetes Maternal Grandmother    Hypertension Paternal Grandmother    Esophageal cancer Neg Hx    Liver disease Neg Hx     Social History   Socioeconomic History   Marital status: Single    Spouse name: Not on file   Number of children: 4   Years of education: college   Highest education level: Some college, no degree  Occupational History    Employer: [employer]    Comment: tax inspector  Tobacco Use   Smoking status: Former    Current packs/day: 0.25    Average packs/day: 0.3 packs/day for 12.2 years (3.1 ttl pk-yrs)    Types: Cigarettes    Start date: 12/21/2011    Passive exposure: Never   Smokeless tobacco: Never  Vaping Use   Vaping status: Never Used  Substance and Sexual Activity   Alcohol use: Yes    Comment: ocassionally    Drug use: Not Currently    Types: Marijuana    Comment: Last use was in 2023   Sexual activity: Not Currently    Partners: Male    Birth control/protection: Surgical    Comment: Tubal Ligation  Other Topics Concern   Not on file  Social History Narrative   Lives with her grandmother and her three children   Does not drink caffeine    Social Drivers of Corporate Investment Banker Strain: Low Risk  (10/16/2023)   Overall Financial Resource Strain (CARDIA)    Difficulty of Paying Living Expenses: Not hard at all  Food Insecurity: Food Insecurity Present (03/11/2024)   Hunger Vital Sign    Worried About Running Out of Food in the Last Year: Sometimes true    Ran Out of Food in the Last Year: Never true  Transportation Needs: No Transportation Needs (02/18/2024)   PRAPARE - Administrator, Civil Service (Medical): No    Lack of Transportation (Non-Medical): No  Physical Activity: Sufficiently Active (10/16/2023)   Exercise Vital Sign     Days of Exercise per Week: 4 days    Minutes of Exercise per Session: 120 min  Stress: Stress Concern Present (10/16/2023)   [employer] of Occupational Health - Occupational Stress Questionnaire    Feeling of Stress: To some extent  Social Connections: Moderately Isolated (10/16/2023)   Social Connection and Isolation Panel    Frequency of Communication with Friends and Family: More than three times a week    Frequency of Social Gatherings with Friends and Family: Patient declined    Attends Religious Services: 1 to 4 times per year    Active Member of [employer] or Organizations: No    Attends Engineer, Structural: Not on file    Marital Status: Never married    Current Medications:  Current  Outpatient Medications:    acetaminophen  (TYLENOL ) 500 MG tablet, Take 1,000 mg by mouth every 6 (six) hours as needed for moderate pain (pain score 4-6), fever or headache., Disp: , Rfl:    omeprazole  (PRILOSEC OTC) 20 MG tablet, Take 20 mg by mouth daily., Disp: , Rfl:    oxybutynin  (DITROPAN  XL) 10 MG 24 hr tablet, Take 1 tablet (10 mg total) by mouth at bedtime., Disp: 7 tablet, Rfl: 0   phenazopyridine  (PYRIDIUM ) 100 MG tablet, Take 1 tablet (100 mg total) by mouth 3 (three) times daily as needed for pain., Disp: 12 tablet, Rfl: 0   polyethylene glycol powder (GLYCOLAX /MIRALAX ) 17 GM/SCOOP powder, Take 233.75 g by mouth once for 1 dose Take according to your procedure prep instructions., Disp: 238 g, Rfl: 0   promethazine  (PHENERGAN ) 25 MG tablet, Take 1 tablet (25 mg total) by mouth every 6 (six) hours as needed for nausea or vomiting., Disp: 30 tablet, Rfl: 0  Review of Systems: Denies appetite changes, fevers, chills, fatigue, unexplained weight changes. Denies hearing loss, neck lumps or masses, mouth sores, ringing in ears or voice changes. Denies cough or wheezing.  Denies shortness of breath. Denies chest pain or palpitations. Denies leg swelling. Denies abdominal distention,  pain, blood in stools, constipation, diarrhea, nausea, vomiting, or early satiety. Denies pain with intercourse, dysuria, frequency, hematuria or incontinence. Denies hot flashes, pelvic pain, vaginal bleeding or vaginal discharge.   Denies joint pain, back pain or muscle pain/cramps. Denies itching, rash, or wounds. Denies dizziness, headaches, numbness or seizures. Denies swollen lymph nodes or glands, denies easy bruising or bleeding. Denies anxiety, depression, confusion, or decreased concentration.  Physical Exam: BP 121/60 (BP Location: Left Arm, Patient Position: Sitting)   Pulse 66   Temp 98.6 F (37 C) (Oral)   Resp 19   Wt 236 lb 6.4 oz (107.2 kg)   LMP  (LMP Unknown) Comment: Depo Injections - last injection 10/2022, had spotting on and off since 10/2022 injection  SpO2 100%   BMI 32.97 kg/m  General: Alert, oriented, no acute distress. HEENT: Posterior oropharynx clear, sclera anicteric. Chest: Unlabored breathing on room air. Abdomen: soft, nontender.  Normoactive bowel sounds.  No masses or hepatosplenomegaly appreciated.  Well-healed incisions. Extremities: Grossly normal range of motion.  Warm, well perfused.  No edema bilaterally. GU: Normal appearing external genitalia without erythema, excoriation, or lesions.  Speculum exam reveals vagina shortened, approximately 4 cm now, cuff intact with suture visible.  No blood or discharge.  Bimanual exam reveals no fluctuance or tenderness with palpation.  Laboratory & Radiologic Studies: A. ROUND LIGAMENT, SOFT TISSUE EXCISION:  Fibromuscular connective tissue consistent with broad ligament.  Negative for malignancy.   B. UPPER VAGINA, PARTIAL VAGINECTOMY:  Invasive squamous cell carcinoma, 2.5 cm (pT1b) with associated high  grade squamous dysplasia.  Carcinoma invades to a depth of 0.9 cm.  Invasive carcinoma focally 2 mm from 3-6 o'clock soft tissue margin.  Lymphovascular invasion.  Focal perineural invasion.  See  oncology table.   Assessment & Plan: Holly Brown is a 38 y.o. 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 Initial tumor PDL1 CPS 0%, CPS on recurrence is 10%.  Doing well.  Discussed continued expectations and restrictions.  Pathology with her from surgery again.  She was given a copy of pathology report.  Given LVI, close margins, and perineural invasion, plan for  adjuvant therapy with radiation and sensitizing cisplatin.  Interestingly, PD-L1 CPS repeated on recent resected tumor and noted to be 10%.  Consideration for addition of immunotherapy that could continue as maintenance.  I suspect her abdominal pain is related to constipation.  Discussed adding MiraLAX  daily for the next couple of weeks to see if she can get her bowels moving more regularly.  Discussed bladder spasms, which I suspect is still healing related to surgery.  Offered several day trial of Pyridium , which was sent to her pharmacy.  20 minutes of total time was spent for this patient encounter, including preparation, face-to-face counseling with the patient and coordination of care, and documentation of the encounter.  Comer Dollar, MD  Division of Gynecologic Oncology  Department of Obstetrics and Gynecology  [facility]

## 2024-03-13 ENCOUNTER — Other Ambulatory Visit: Payer: Self-pay

## 2024-03-21 ENCOUNTER — Telehealth: Payer: Self-pay

## 2024-03-21 NOTE — Telephone Encounter (Signed)
 Returned her call and given fax # to fax FMLA paper work to be completed.

## 2024-03-22 ENCOUNTER — Other Ambulatory Visit (HOSPITAL_COMMUNITY): Payer: Self-pay

## 2024-03-24 ENCOUNTER — Other Ambulatory Visit: Payer: Self-pay

## 2024-03-24 ENCOUNTER — Ambulatory Visit
Admission: RE | Admit: 2024-03-24 | Discharge: 2024-03-24 | Disposition: A | Discharge status: Home / Self Care | Source: Ambulatory Visit | Attending: Radiation Oncology | Admitting: Radiation Oncology

## 2024-03-24 DIAGNOSIS — Z5111 Encounter for antineoplastic chemotherapy: Secondary | ICD-10-CM | POA: Diagnosis not present

## 2024-03-24 DIAGNOSIS — C539 Malignant neoplasm of cervix uteri, unspecified: Secondary | ICD-10-CM | POA: Diagnosis present

## 2024-03-24 DIAGNOSIS — N939 Abnormal uterine and vaginal bleeding, unspecified: Secondary | ICD-10-CM | POA: Insufficient documentation

## 2024-03-24 DIAGNOSIS — Z510 Encounter for antineoplastic radiation therapy: Secondary | ICD-10-CM | POA: Diagnosis present

## 2024-03-24 NOTE — H&P (Signed)
 Chief Complaint: recurrent cervical cancer; referred for image guided port a cath placement to assist with treatment  Referring Provider(s): Gorsuch,Ni  Supervising Physician: Hughes Simmonds  Patient Status: [facility] - Out-pt  History of Present Illness: Holly Brown is a 38 y.o. female with medical history of recurrent cervical cancer. Patient underwent hysterectomy in 2024 which revealed moderately differentiated squamous cell carcinoma, but she did not need adjuvant treatment. 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 She presents today for image guided port a catheter placement to receive chemotherapy for treatment of recurrent cervical cancer.     Allergies Reviewed:  Keflex  [cephalexin ]   Patient is Full Code  Past Medical History:  Diagnosis Date   Anxiety    Cancer (HCC)    cervical cancer   Depression    Fatty liver    GERD (gastroesophageal reflux disease)    History of hiatal hernia    History of UTI    HSV (herpes simplex virus) infection    Migraines    Peripheral vascular disease    Post-operative nausea and vomiting 12/19/2022   Seizures (HCC)    Jan.2016 - only had one seizure, none since   Sleep apnea    no CPAP   Vaginal Pap smear, abnormal     Past Surgical History:  Procedure Laterality Date   BIOPSY  12/24/2022   Procedure: BIOPSY;  Surgeon: Leigh Elspeth SQUIBB, MD;  Location: [facility] ENDOSCOPY;  Service: Gastroenterology;;   BONE BIOPSY  12/12/2023   Procedure: BIOPSY, GI;  Surgeon: Federico Rosario BROCKS, MD;  Location: [facility] ENDOSCOPY;  Service: Gastroenterology;;   COLONOSCOPY N/A 12/12/2023   Procedure: COLONOSCOPY;  Surgeon: Federico Rosario BROCKS, MD;  Location: [facility] ENDOSCOPY;  Service: Gastroenterology;  Laterality: N/A;   CYSTOSCOPY WITH INDOCYANINE GREEN  IMAGING (ICG) Bilateral 02/10/2024   Procedure: CYSTOSCOPY WITH INDOCYANINE GREEN  IMAGING (ICG);   Surgeon: Cam Morene ORN, MD;  Location: [facility] ORS;  Service: Urology;  Laterality: Bilateral;   DILATION AND CURETTAGE OF UTERUS     ESOPHAGOGASTRODUODENOSCOPY (EGD) WITH PROPOFOL  N/A 12/24/2022   Procedure: ESOPHAGOGASTRODUODENOSCOPY (EGD) WITH PROPOFOL ;  Surgeon: Leigh Elspeth SQUIBB, MD;  Location: [facility] ENDOSCOPY;  Service: Gastroenterology;  Laterality: N/A;   LAPAROSCOPIC TUBAL LIGATION Bilateral 12/08/2018   Procedure: LAPAROSCOPIC TUBAL LIGATION;  Surgeon: Alger Gong, MD;  Location: [facility];  Service: Gynecology;  Laterality: Bilateral;   LEEP N/A 03/18/2022   Procedure: LOOP ELECTROSURGICAL EXCISION PROCEDURE (LEEP);  Surgeon: Alger Gong, MD;  Location: [facility] OR;  Service: Gynecology;  Laterality: N/A;   MINI-LAPAROTOMY     after hysterectomy   POLYPECTOMY  12/12/2023   Procedure: POLYPECTOMY, INTESTINE;  Surgeon: Federico Rosario BROCKS, MD;  Location: [facility] ENDOSCOPY;  Service: Gastroenterology;;   TUBAL LIGATION     VAGINAL HYSTERECTOMY N/A 12/16/2022   Procedure: TOTAL VAGINAL HYSTERECTOMY, BILATERAL SALPINGECTOMY;  Surgeon: Lorence Ozell CROME, MD;  Location: [facility] OR;  Service: Gynecology;  Laterality: N/A;   VAGINECTOMY, PARTIAL N/A 02/10/2024   Procedure: UPPER VAGINECTOMY, LEFT PARAMETRECTOMY, LEFT URETEROLYSIS, OVARIAN TRANSPOSITION;  Surgeon: [name] Comer SAUNDERS, MD;  Location: [facility] ORS;  Service: Gynecology;  Laterality: N/A;  ROBOTIC  ASSISTED UPPER VAGINECTOMY   WISDOM TOOTH EXTRACTION  04/21/2009      Medications: Prior to Admission medications   Medication Sig Start Date End Date Taking? Authorizing Provider  acetaminophen  (TYLENOL ) 500 MG tablet Take 1,000 mg by mouth every 6 (six) hours as  needed for moderate pain (pain score 4-6), fever or headache.    [provider]  omeprazole  (PRILOSEC OTC) 20 MG tablet Take 20 mg by mouth daily.    [provider]  oxybutynin  (DITROPAN  XL) 10 MG 24 hr tablet Take 1 tablet (10 mg total) by mouth at  bedtime. 02/21/24   Keith, Kayla N, PA-C  phenazopyridine  (PYRIDIUM ) 100 MG tablet Take 1 tablet (100 mg total) by mouth 3 (three) times daily as needed for pain. 03/11/24   Tucker, Katherine R, MD  polyethylene glycol powder (GLYCOLAX /MIRALAX ) 17 GM/SCOOP powder Take 233.75 g by mouth once for 1 dose Take according to your procedure prep instructions. 02/03/24     promethazine  (PHENERGAN ) 25 MG tablet Take 1 tablet (25 mg total) by mouth every 6 (six) hours as needed for nausea or vomiting. 01/28/24   Mannie Fairy DASEN, DO     Family History  Problem Relation Age of Onset   Diabetes Mother    Stroke Mother    Colon cancer Father 51   Cancer Father    Hypertension Maternal Grandmother    Diabetes Maternal Grandmother    Hypertension Paternal Grandmother    Esophageal cancer Neg Hx    Liver disease Neg Hx     Social History   Socioeconomic History   Marital status: Single    Spouse name: Not on file   Number of children: 4   Years of education: college   Highest education level: Some college, no degree  Occupational History    Employer: [employer]    Comment: tax inspector  Tobacco Use   Smoking status: Former    Current packs/day: 0.25    Average packs/day: 0.3 packs/day for 12.3 years (3.1 ttl pk-yrs)    Types: Cigarettes    Start date: 12/21/2011    Passive exposure: Never   Smokeless tobacco: Never  Vaping Use   Vaping status: Never Used  Substance and Sexual Activity   Alcohol use: Yes    Comment: ocassionally    Drug use: Not Currently    Types: Marijuana    Comment: Last use was in 2023   Sexual activity: Not Currently    Partners: Male    Birth control/protection: Surgical    Comment: Tubal Ligation  Other Topics Concern   Not on file  Social History Narrative   Lives with her grandmother and her three children   Does not drink caffeine    Social Drivers of Corporate Investment Banker Strain: Low Risk  (10/16/2023)   Overall Financial Resource  Strain (CARDIA)    Difficulty of Paying Living Expenses: Not hard at all  Food Insecurity: Food Insecurity Present (03/11/2024)   Hunger Vital Sign    Worried About Running Out of Food in the Last Year: Sometimes true    Ran Out of Food in the Last Year: Never true  Transportation Needs: No Transportation Needs (02/18/2024)   PRAPARE - Administrator, Civil Service (Medical): No    Lack of Transportation (Non-Medical): No  Physical Activity: Sufficiently Active (10/16/2023)   Exercise Vital Sign    Days of Exercise per Week: 4 days    Minutes of Exercise per Session: 120 min  Stress: Stress Concern Present (10/16/2023)   [employer] of Occupational Health - Occupational Stress Questionnaire    Feeling of Stress: To some extent  Social Connections: Moderately Isolated (10/16/2023)   Social Connection and Isolation Panel    Frequency of Communication  with Friends and Family: More than three times a week    Frequency of Social Gatherings with Friends and Family: Patient declined    Attends Religious Services: 1 to 4 times per year    Active Member of [employer] or Organizations: No    Attends Engineer, Structural: Not on file    Marital Status: Never married     Review of Systems: denies fever,HA,CP,dyspnea, cough, abd/back pain,N/V or bleeding    Vital Signs: Vitals:   03/25/24 0802  BP: 114/77  Pulse: (!) 57  Resp: 18  Temp: 98.9 F (37.2 C)  SpO2: 99%    LMP  (LMP Unknown) Comment: Depo Injections - last injection 10/2022, had spotting on and off since 10/2022 injection    Physical Exam awake/alert; chest- CTA bilat; heart- sl bradycardic but reg rhythm; abd-soft,+BS,NT; no LE edema  Imaging: No results found.  Labs:  CBC: Recent Labs    02/11/24 0444 02/12/24 0631 02/13/24 0913 02/21/24 1605  WBC 13.7* 9.7 8.3 8.8  HGB 12.4 12.1 12.1 12.1  HCT 37.8 39.0 38.1 37.8  PLT 373 318 348 523*    COAGS: No results for input(s): INR, APTT  in the last 8760 hours.  BMP: Recent Labs    02/12/24 0631 02/13/24 0913 02/14/24 1009 02/21/24 1605  NA 137 137 136 143  K 3.6 3.3* 3.6 4.1  CL 103 102 100 107  CO2 25 25 24 25   GLUCOSE 91 93 125* 102*  BUN <5* <5* 6 11  CALCIUM  9.2 9.2 9.4 9.5  CREATININE 0.81 0.84 0.83 0.91  GFRNONAA >60 >60 >60 >60    LIVER FUNCTION TESTS: Recent Labs    12/08/23 0206 12/11/23 0145 12/16/23 1019 01/20/24 0835 02/04/24 0856 02/21/24 1605  BILITOT 0.5 1.2 1.3* 1.3* 0.7 0.5  AST 16 34 16 12 17  37  ALT 9 38 27 12 14  41  ALKPHOS 67 47  --   --  63 95  PROT 7.0 5.6* 7.4 6.7 7.3 7.2  ALBUMIN 4.2 2.9*  --   --  4.4 4.0    TUMOR MARKERS: No results for input(s): AFPTM, CEA, CA199, CHROMGRNA in the last 8760 hours.  Assessment and Plan: Recurrent cervical cancer, poor venous access  Request received from oncology for image guided port a catheter placement.   Risks and benefits of image guided port-a-catheter placement was discussed with the patient including, but not limited to bleeding, infection, pneumothorax, or fibrin sheath development and need for additional procedures.  All of the patient's questions were answered, patient is agreeable to proceed. Consent signed and in chart.   Thank you for allowing our service to participate in Holly Brown 's care.    Electronically Signed: Kristi B Davenport, NP Gabino Kelsie RIGGERS  03/24/2024, 2:26 PM     I spent a total of 20 minutes in face to face in clinical consultation, greater than 50% of which was counseling/coordinating care for image guided port a catheter placement.   (A copy of this note was sent to the referring provider and the time of visit.)

## 2024-03-25 ENCOUNTER — Encounter (HOSPITAL_COMMUNITY): Payer: Self-pay

## 2024-03-25 ENCOUNTER — Other Ambulatory Visit: Payer: Self-pay

## 2024-03-25 ENCOUNTER — Ambulatory Visit (HOSPITAL_COMMUNITY)
Admission: RE | Admit: 2024-03-25 | Discharge: 2024-03-25 | Disposition: A | Discharge status: Home / Self Care | Source: Ambulatory Visit | Attending: Hematology and Oncology | Admitting: Hematology and Oncology

## 2024-03-25 ENCOUNTER — Ambulatory Visit (HOSPITAL_COMMUNITY)
Admission: RE | Admit: 2024-03-25 | Discharge: 2024-03-25 | Disposition: A | Source: Ambulatory Visit | Attending: Hematology and Oncology

## 2024-03-25 DIAGNOSIS — C539 Malignant neoplasm of cervix uteri, unspecified: Secondary | ICD-10-CM | POA: Diagnosis not present

## 2024-03-25 DIAGNOSIS — Z90710 Acquired absence of both cervix and uterus: Secondary | ICD-10-CM | POA: Diagnosis not present

## 2024-03-25 DIAGNOSIS — Z87891 Personal history of nicotine dependence: Secondary | ICD-10-CM | POA: Diagnosis not present

## 2024-03-25 HISTORY — PX: IR IMAGING GUIDED PORT INSERTION: IMG5740

## 2024-03-25 MED ORDER — HEPARIN SOD (PORK) LOCK FLUSH 100 UNIT/ML IV SOLN
INTRAVENOUS | Status: AC
  Filled 2024-03-25: qty 5

## 2024-03-25 MED ORDER — MIDAZOLAM HCL (PF) 2 MG/2ML IJ SOLN
INTRAMUSCULAR | Status: AC | PRN
  Administered 2024-03-25 (×2): 1 mg via INTRAVENOUS

## 2024-03-25 MED ORDER — LIDOCAINE-EPINEPHRINE 1 %-1:100000 IJ SOLN
20.0000 mL | Freq: Once | INTRAMUSCULAR | Status: AC
  Administered 2024-03-25: 20 mL via INTRADERMAL

## 2024-03-25 MED ORDER — FENTANYL CITRATE (PF) 100 MCG/2ML IJ SOLN
INTRAMUSCULAR | Status: AC
  Filled 2024-03-25: qty 2

## 2024-03-25 MED ORDER — SODIUM CHLORIDE 0.9 % IV SOLN: INTRAVENOUS | Status: DC

## 2024-03-25 MED ORDER — LIDOCAINE-EPINEPHRINE 1 %-1:100000 IJ SOLN
INTRAMUSCULAR | Status: AC
  Filled 2024-03-25: qty 1

## 2024-03-25 MED ORDER — FENTANYL CITRATE (PF) 100 MCG/2ML IJ SOLN
INTRAMUSCULAR | Status: AC | PRN
  Administered 2024-03-25 (×2): 50 ug via INTRAVENOUS

## 2024-03-25 MED ORDER — HEPARIN SOD (PORK) LOCK FLUSH 100 UNIT/ML IV SOLN
500.0000 [IU] | Freq: Once | INTRAVENOUS | Status: AC
  Administered 2024-03-25: 500 [IU] via INTRAVENOUS

## 2024-03-25 MED ORDER — MIDAZOLAM HCL 2 MG/2ML IJ SOLN
INTRAMUSCULAR | Status: AC
  Filled 2024-03-25: qty 2

## 2024-03-25 NOTE — Progress Notes (Signed)
 Pharmacist Chemotherapy Monitoring - Initial Assessment    Anticipated start date: 04/01/24   The following has been reviewed per standard work regarding the patient's treatment regimen: The patient's diagnosis, treatment plan and drug doses, and organ/hematologic function Lab orders and baseline tests specific to treatment regimen  The treatment plan start date, drug sequencing, and pre-medications Prior authorization status  Patient's documented medication list, including drug-drug interaction screen and prescriptions for anti-emetics and supportive care specific to the treatment regimen The drug concentrations, fluid compatibility, administration routes, and timing of the medications to be used The patient's access for treatment and lifetime cumulative dose history, if applicable  The patient's medication allergies and previous infusion related reactions, if applicable   Changes made to treatment plan:  N/A  Follow up needed:  Pending authorization for treatment Release of home anti-emetics  Holly Brown R Presnell, RPH, 03/25/2024  4:10 PM

## 2024-03-25 NOTE — Discharge Instructions (Signed)
 Please call Interventional Radiology clinic [phone] with any questions or concerns.  You may remove your dressing and shower tomorrow.  After the procedure, it is common to have: Discomfort at the port insertion site. Bruising on the skin over the port. This should improve over 3-4 days  Follow these instructions at home:  Medication: Do not use Aspirin or ibuprofen products, such as Advil or Motrin, as it may increase bleeding.  You may resume your usual medications as ordered by your doctor. If your doctor prescribed antibiotics, take them as directed. Do not stop taking them just because you feel better. You need to take the full course of antibiotics.  Eating and drinking: Drink plenty of liquids to keep your urine pale yellow You can resume your regular diet as directed by your doctor   Care of the procedure site Follow instructions from your health care provider about how to take care of your port insertion site. Make sure you: After your port is placed, you will get a manufacturer's information card. The card has information about your port. Keep this card with you at all times Make sure to remember what type of port you have Take care of the port as told by your health care provider DO NOT use EMLA cream for 2 weeks after port placement -the cream will remove surgical glue on your incision Wash your hands with soap and water before and after you change your bandage (dressing). If soap and water are not available, use hand sanitizer Change your dressing as told by your health care provider Leave skin glue, or adhesive strips in place. These skin closures may need to stay in place for 2 weeks or longer Check your port insertion site every day for signs of infection. Check for: Redness, swelling, or pain Fluid or blood Warmth Pus or a bad smell  Activity Return to your normal activities as told by your health care provider. Ask your health care provider what activities  are safe for you Do not lift anything that is heavier than 10 lb (4.5 kg), or the limit that you are told, until your health care provider says that it is safe Do not take baths, swim, or use a hot tub until your health care provider approves. Take showers only. Keep all follow-up visits as told by your doctor  Contact a health care provider if: You cannot flush your port with saline as directed, or you cannot draw blood from the port You have a fever or chills You have redness, swelling, or pain around your port insertion site You have fluid or blood coming from your port insertion site Your port insertion site feels warm to the touch You have pus or a bad smell coming from the port insertion site  Get help right away if: You have chest pain or shortness of breath You have bleeding from your port that you cannot control   Moderate Conscious Sedation-Care After  This sheet gives you information about how to care for yourself after your procedure. Your health care provider may also give you more specific instructions. If you have problems or questions, contact your health care provider.  After the procedure, it is common to have: Sleepiness for several hours. Impaired judgment for several hours. Difficulty with balance. Vomiting if you eat too soon.  Follow these instructions at home:  Rest. Do not participate in activities where you could fall or become injured. Do not drive or use machinery. Do not drink alcohol. Do not  take sleeping pills or medicines that cause drowsiness. Do not make important decisions or sign legal documents. Do not take care of children on your own.  Eating and drinking Follow the diet recommended by your health care provider. Drink enough fluid to keep your urine pale yellow. If you vomit: Drink water, juice, or soup when you can drink without vomiting. Make sure you have little or no nausea before eating solid foods.  General instructions Take  over-the-counter and prescription medicines only as told by your health care provider. Have a responsible adult stay with you for the time you are told. It is important to have someone help care for you until you are awake and alert. Do not smoke. Keep all follow-up visits as told by your health care provider. This is important.  Contact a health care provider if: You are still sleepy or having trouble with balance after 24 hours. You feel light-headed. You keep feeling nauseous or you keep vomiting. You develop a rash. You have a fever. You have redness or swelling around the IV site.  Get help right away if: You have trouble breathing. You have new-onset confusion at home.  This information is not intended to replace advice given to you by your health care provider. Make sure you discuss any questions you have with your healthcare provider.

## 2024-03-25 NOTE — Procedures (Signed)
 Vascular and Interventional Radiology Procedure Note  Patient: Holly Brown DOB: 01-13-1986 Medical Record Number: [id number] Note Date/Time: 03/25/24 9:37 AM   Performing Physician: Thom Hall, MD Assistant(s): None  Diagnosis: GYN CA  Procedure: PORT PLACEMENT  Anesthesia: Conscious Sedation Complications: None Estimated Blood Loss: Minimal  Findings:  Successful right-sided port placement, with the tip of the catheter in the proximal right atrium.  Plan: Catheter ready for use.  See detailed procedure note with images in PACS. The patient tolerated the procedure well without incident or complication and was returned to Recovery in stable condition.    Thom Hall, MD [facility] Radiology   Pager. [phone] Clinic. [phone]

## 2024-03-31 ENCOUNTER — Encounter: Payer: Self-pay | Admitting: Hematology and Oncology

## 2024-03-31 ENCOUNTER — Other Ambulatory Visit (HOSPITAL_COMMUNITY): Payer: Self-pay

## 2024-03-31 ENCOUNTER — Other Ambulatory Visit: Payer: Self-pay | Admitting: Hematology and Oncology

## 2024-03-31 ENCOUNTER — Telehealth: Payer: Self-pay | Admitting: *Deleted

## 2024-03-31 ENCOUNTER — Inpatient Hospital Stay

## 2024-03-31 ENCOUNTER — Inpatient Hospital Stay (HOSPITAL_BASED_OUTPATIENT_CLINIC_OR_DEPARTMENT_OTHER): Admitting: Hematology and Oncology

## 2024-03-31 ENCOUNTER — Inpatient Hospital Stay: Attending: Gynecologic Oncology

## 2024-03-31 ENCOUNTER — Inpatient Hospital Stay: Admitting: Gynecologic Oncology

## 2024-03-31 VITALS — BP 120/51 | HR 77 | Temp 98.8°F | Resp 18 | Ht 70.0 in | Wt 219.0 lb

## 2024-03-31 DIAGNOSIS — N898 Other specified noninflammatory disorders of vagina: Secondary | ICD-10-CM

## 2024-03-31 DIAGNOSIS — Z9079 Acquired absence of other genital organ(s): Secondary | ICD-10-CM

## 2024-03-31 DIAGNOSIS — C538 Malignant neoplasm of overlapping sites of cervix uteri: Secondary | ICD-10-CM

## 2024-03-31 DIAGNOSIS — C539 Malignant neoplasm of cervix uteri, unspecified: Secondary | ICD-10-CM | POA: Insufficient documentation

## 2024-03-31 DIAGNOSIS — N939 Abnormal uterine and vaginal bleeding, unspecified: Secondary | ICD-10-CM | POA: Insufficient documentation

## 2024-03-31 DIAGNOSIS — C531 Malignant neoplasm of exocervix: Secondary | ICD-10-CM | POA: Diagnosis not present

## 2024-03-31 DIAGNOSIS — Z5111 Encounter for antineoplastic chemotherapy: Secondary | ICD-10-CM | POA: Insufficient documentation

## 2024-03-31 DIAGNOSIS — R310 Gross hematuria: Secondary | ICD-10-CM

## 2024-03-31 LAB — CMP (CANCER CENTER ONLY)
ALT: 9 U/L (ref 0–44)
AST: 13 U/L — ABNORMAL LOW (ref 15–41)
Albumin: 4.4 g/dL (ref 3.5–5.0)
Alkaline Phosphatase: 72 U/L (ref 38–126)
Anion gap: 10 (ref 5–15)
BUN: 16 mg/dL (ref 6–20)
CO2: 25 mmol/L (ref 22–32)
Calcium: 9.6 mg/dL (ref 8.9–10.3)
Chloride: 105 mmol/L (ref 98–111)
Creatinine: 1.23 mg/dL — ABNORMAL HIGH (ref 0.44–1.00)
GFR, Estimated: 58 mL/min — ABNORMAL LOW (ref >60)
Glucose, Bld: 99 mg/dL (ref 70–99)
Potassium: 3.5 mmol/L (ref 3.5–5.1)
Sodium: 139 mmol/L (ref 135–145)
Total Bilirubin: 0.8 mg/dL (ref 0.0–1.2)
Total Protein: 7.5 g/dL (ref 6.5–8.1)

## 2024-03-31 LAB — URINALYSIS, COMPLETE (UACMP) WITH MICROSCOPIC
Bilirubin Urine: NEGATIVE
Glucose, UA: NEGATIVE mg/dL
Ketones, ur: NEGATIVE mg/dL
Nitrite: POSITIVE — AB
Protein, ur: NEGATIVE mg/dL
Specific Gravity, Urine: 1.02 (ref 1.005–1.030)
WBC, UA: >50 WBC/hpf (ref 0–5)
pH: 5 (ref 5.0–8.0)

## 2024-03-31 LAB — CBC WITH DIFFERENTIAL (CANCER CENTER ONLY)
Abs Immature Granulocytes: 0.01 K/uL (ref 0.00–0.07)
Basophils Absolute: 0.1 K/uL (ref 0.0–0.1)
Basophils Relative: 1 %
Eosinophils Absolute: 0.3 K/uL (ref 0.0–0.5)
Eosinophils Relative: 3 %
HCT: 37.6 % (ref 36.0–46.0)
Hemoglobin: 12.8 g/dL (ref 12.0–15.0)
Immature Granulocytes: 0 %
Lymphocytes Relative: 37 %
Lymphs Abs: 3.1 K/uL (ref 0.7–4.0)
MCH: 30.8 pg (ref 26.0–34.0)
MCHC: 34 g/dL (ref 30.0–36.0)
MCV: 90.6 fL (ref 80.0–100.0)
Monocytes Absolute: 0.7 K/uL (ref 0.1–1.0)
Monocytes Relative: 9 %
Neutro Abs: 4.2 K/uL (ref 1.7–7.7)
Neutrophils Relative %: 50 %
Platelet Count: 313 K/uL (ref 150–400)
RBC: 4.15 MIL/uL (ref 3.87–5.11)
RDW: 12.8 % (ref 11.5–15.5)
WBC Count: 8.3 K/uL (ref 4.0–10.5)
nRBC: 0 % (ref 0.0–0.2)

## 2024-03-31 LAB — MAGNESIUM: Magnesium: 1.8 mg/dL (ref 1.7–2.4)

## 2024-03-31 LAB — TSH: TSH: 1.1 u[IU]/mL (ref 0.350–4.500)

## 2024-03-31 MED ORDER — LIDOCAINE-PRILOCAINE 2.5-2.5 % EX CREA
TOPICAL_CREAM | CUTANEOUS | 3 refills | Status: AC
  Filled 2024-03-31: qty 30, 30d supply, fill #0

## 2024-03-31 MED ORDER — PROCHLORPERAZINE MALEATE 10 MG PO TABS
10.0000 mg | ORAL_TABLET | Freq: Four times a day (QID) | ORAL | 1 refills | Status: AC | PRN
  Filled 2024-03-31: qty 30, 8d supply, fill #0

## 2024-03-31 MED ORDER — ONDANSETRON HCL 8 MG PO TABS
ORAL_TABLET | ORAL | 1 refills | Status: AC
  Filled 2024-03-31: qty 30, 10d supply, fill #0

## 2024-03-31 MED FILL — Fosaprepitant Dimeglumine For IV Infusion 150 MG (Base Eq): INTRAVENOUS | Qty: 5 | Status: AC

## 2024-03-31 NOTE — Progress Notes (Signed)
 [name] OFFICE PROGRESS NOTE  Patient Care Team: Holly Brown, Holly Brown BROCKS, NP as PCP - General (Family Medicine) Holly Brown SAUNDERS, MD as Consulting Physician (Gynecologic Oncology) Holly Brown SAUNDERS, RN as Oncology Nurse Navigator (Oncology)  Assessment & Plan Malignant neoplasm of cervix, unspecified site [facility]) Disease status: Undetermined Evidence: Symptoms  The patient has abnormal Pap smear dated back to 2017.  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  Subsequent follow-up in October confirmed likelihood of recurrent disease.  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  There is present of lymphovascular invasion and perineural invasion  She is still recovering from surgery We discussed the role of adjuvant treatment Her previous PD-L1 test from 2024 was negative, repeat PD-L1 on her latest tissue from October 2025 was 40% positive  We discussed again risk, benefits, side effects of combination of treatment with cisplatin, pembrolizumab with radiation treatment She will be evaluated by GYN surgeon today for vaginal bleeding I plan to repeat imaging study after 3 cycles of treatment Vaginal irritation She had recurrent vaginal bleeding She will be evaluated by GYN surgeon today I will order urinalysis and urine culture to rule out urinary tract infection  No orders of the defined types were placed in this encounter.    Holly Bedford, MD  INTERVAL HISTORY: she returns for treatment follow-up She had recent successful port placement She complained of bladder irritation and recent bleeding   PHYSICAL EXAMINATION: ECOG PERFORMANCE  STATUS: 1 - Symptomatic but completely ambulatory  No results found for: CAN125    Latest Ref Rng & Units 03/31/2024    2:50 PM 02/21/2024    4:05 PM 02/13/2024    9:13 AM  CBC  WBC 4.0 - 10.5 K/uL 8.3  8.8  8.3   Hemoglobin 12.0 - 15.0 g/dL 87.1  87.8  87.8   Hematocrit 36.0 - 46.0 % 37.6  37.8  38.1   Platelets 150 - 400 K/uL 313  523  348       Chemistry      Component Value Date/Time   NA 143 02/21/2024 1605   NA 139 10/10/2015 1623   K 4.1 02/21/2024 1605   CL 107 02/21/2024 1605   CO2 25 02/21/2024 1605   BUN 11 02/21/2024 1605   BUN 7 10/10/2015 1623   CREATININE 0.91 02/21/2024 1605   CREATININE 0.82 12/16/2023 1019      Component Value Date/Time   CALCIUM  9.5 02/21/2024 1605   ALKPHOS 95 02/21/2024 1605   AST 37 02/21/2024 1605   ALT 41 02/21/2024 1605   BILITOT 0.5 02/21/2024 1605   BILITOT 0.7 10/10/2015 1623       Vitals:   03/31/24 1533  BP: (!) 120/51  Pulse: 77  Resp: 18  Temp: 98.8 F (37.1 C)  SpO2: 100%   Filed Weights   03/31/24 1533  Weight: 219 lb (99.3 kg)   Other relevant data reviewed during this visit included CBC and CMP

## 2024-03-31 NOTE — Assessment & Plan Note (Addendum)
 She had recurrent vaginal bleeding She will be evaluated by GYN surgeon today I will order urinalysis and urine culture to rule out urinary tract infection

## 2024-03-31 NOTE — Progress Notes (Unsigned)
 Patient presents today to the office for bleeding noted when urinating earlier this morning.  She has had no bleeding since this time.  She went to urinate this morning and noticed blood in the toilet but no blood when wiping.  She placed a panty liner and has noticed no bleeding since this time.  She has had intermittent cramping with relief from Pyridium  sent in by Dr. [name].  Doing well otherwise.  She does report feeling out of breath when doing more but this resolves when resting.  Bowels are functioning without difficulty.  No lower extremity edema or pain reported.  Incisions are healing well without drainage.

## 2024-03-31 NOTE — Telephone Encounter (Signed)
 12//12/2023 Completed [employer] paperwork sent to provider to review, make any needed amendments, sign and return to form staff.   Signed paperwork returned to this nurse.   Successfully returned via fax.   Faxed to (SW) HIM for medical records to be sent..  Copy to [facility] HIM bin designated for items to be scanned to EMR.   Prepared paperwork for pick up on tomorrow's scheduled appt.SABRA   Connwected with Sandeep E Shorb today for patient notification. Process completed with no further instructions received, actions performed or required by this nurse.

## 2024-03-31 NOTE — Assessment & Plan Note (Addendum)
 Disease status: Undetermined Evidence: Symptoms  The patient has abnormal Pap smear dated back to 2017.  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  Subsequent follow-up in October confirmed likelihood of recurrent disease.  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  There is present of lymphovascular invasion and perineural invasion  She is still recovering from surgery We discussed the role of adjuvant treatment Her previous PD-L1 test from 2024 was negative, repeat PD-L1 on her latest tissue from October 2025 was 40% positive  We discussed again risk, benefits, side effects of combination of treatment with cisplatin, pembrolizumab with radiation treatment She will be evaluated by GYN surgeon today for vaginal bleeding I plan to repeat imaging study after 3 cycles of treatment

## 2024-03-31 NOTE — Telephone Encounter (Signed)
 Called patient to assess bleeding and urinary symptoms, she left an after hours message and is to see Dr. Lonn today. Labs and UA to be collected today. She reports one episode of blood in her urine at 0430 this morning. Denies further bleeding since that one episode. Denies pain, fever or chills. Denies anything in her vagina since surgery. Reports some occasional vaginal pain that feels like a shock, but denies any vaginal irritation

## 2024-03-31 NOTE — Progress Notes (Signed)
 Called pt to introduce myself as her Dance Movement Psychotherapist and to discuss the [employer].  Pt would like to apply so I emailed her an expense sheet and she will email me her proof of income and SNAP benefits today.  She has my contact information for any questions or concerns she may have in the future.

## 2024-03-31 NOTE — Patient Instructions (Signed)
 It was good seeing you today. You are healing well. No blood or active bleeding seen on vaginal exam. The sutures are still present which is expected.  We will see how your urine sample looks that Dr. Lonn ordered.  Please call us  if you notice further bleeding or have any new symptoms.

## 2024-03-31 NOTE — Progress Notes (Signed)
 Pt is approved for the $1000 [employer].

## 2024-04-01 ENCOUNTER — Encounter: Payer: Self-pay | Admitting: Hematology and Oncology

## 2024-04-01 ENCOUNTER — Other Ambulatory Visit (HOSPITAL_COMMUNITY): Payer: Self-pay

## 2024-04-01 ENCOUNTER — Other Ambulatory Visit: Payer: Self-pay | Admitting: Hematology and Oncology

## 2024-04-01 ENCOUNTER — Inpatient Hospital Stay

## 2024-04-01 ENCOUNTER — Inpatient Hospital Stay: Admitting: Licensed Clinical Social Worker

## 2024-04-01 VITALS — BP 138/76 | HR 89 | Temp 98.9°F | Resp 18 | Ht 70.0 in | Wt 214.1 lb

## 2024-04-01 DIAGNOSIS — C531 Malignant neoplasm of exocervix: Secondary | ICD-10-CM

## 2024-04-01 DIAGNOSIS — Z5111 Encounter for antineoplastic chemotherapy: Secondary | ICD-10-CM | POA: Diagnosis not present

## 2024-04-01 LAB — T4: T4, Total: 9.7 ug/dL (ref 4.5–12.0)

## 2024-04-01 MED ORDER — CIPROFLOXACIN HCL 250 MG PO TABS
250.0000 mg | ORAL_TABLET | Freq: Two times a day (BID) | ORAL | 0 refills | Status: AC
  Filled 2024-04-01: qty 10, 5d supply, fill #0

## 2024-04-01 MED ORDER — SODIUM CHLORIDE 0.9 % IV SOLN
200.0000 mg | Freq: Once | INTRAVENOUS | Status: AC
  Administered 2024-04-01: 200 mg via INTRAVENOUS
  Filled 2024-04-01: qty 200

## 2024-04-01 MED ORDER — POTASSIUM CHLORIDE IN NACL 20-0.9 MEQ/L-% IV SOLN
Freq: Once | INTRAVENOUS | Status: AC
  Filled 2024-04-01: qty 1000

## 2024-04-01 MED ORDER — SODIUM CHLORIDE 0.9 % IV SOLN
40.0000 mg/m2 | Freq: Once | INTRAVENOUS | Status: AC
  Administered 2024-04-01: 83 mg via INTRAVENOUS
  Filled 2024-04-01: qty 83

## 2024-04-01 MED ORDER — ACETAMINOPHEN 325 MG PO TABS
650.0000 mg | ORAL_TABLET | Freq: Once | ORAL | Status: AC
  Administered 2024-04-01: 650 mg via ORAL
  Filled 2024-04-01: qty 2

## 2024-04-01 MED ORDER — SODIUM CHLORIDE 0.9 % IV SOLN: INTRAVENOUS | Status: DC

## 2024-04-01 MED ORDER — MAGNESIUM SULFATE 2 GM/50ML IV SOLN
2.0000 g | Freq: Once | INTRAVENOUS | Status: AC
  Administered 2024-04-01: 2 g via INTRAVENOUS
  Filled 2024-04-01: qty 50

## 2024-04-01 MED ORDER — SODIUM CHLORIDE 0.9 % IV SOLN
150.0000 mg | Freq: Once | INTRAVENOUS | Status: AC
  Administered 2024-04-01: 150 mg via INTRAVENOUS
  Filled 2024-04-01: qty 150

## 2024-04-01 MED ORDER — PALONOSETRON HCL INJECTION 0.25 MG/5ML
0.2500 mg | Freq: Once | INTRAVENOUS | Status: AC
  Administered 2024-04-01: 0.25 mg via INTRAVENOUS
  Filled 2024-04-01: qty 5

## 2024-04-01 MED ORDER — DEXAMETHASONE SOD PHOSPHATE PF 10 MG/ML IJ SOLN
10.0000 mg | Freq: Once | INTRAMUSCULAR | Status: AC
  Administered 2024-04-01: 10 mg via INTRAVENOUS

## 2024-04-01 NOTE — Patient Instructions (Signed)
 [facility] - A DEPT OF [place]   Discharge Instructions: Thank you for choosing [facility] to provide your oncology and hematology care.   If you have a lab appointment with the Cancer Center, please go directly to the Cancer Center and check in at the registration area.   Wear comfortable clothing and clothing appropriate for easy access to any Portacath or PICC line.   We strive to give you quality time with your provider. You may need to reschedule your appointment if you arrive late (15 or more minutes).  Arriving late affects you and other patients whose appointments are after yours.  Also, if you miss three or more appointments without notifying the office, you may be dismissed from the clinic at the providers discretion.      For prescription refill requests, have your pharmacy contact our office and allow 72 hours for refills to be completed.    Today you received the following chemotherapy and/or immunotherapy agents: pembrolizumab (Keytruda) and cisplatin (Platinol)      To help prevent nausea and vomiting after your treatment, we encourage you to take your nausea medication as directed.  BELOW ARE SYMPTOMS THAT SHOULD BE REPORTED IMMEDIATELY: *FEVER GREATER THAN 100.4 F (38 C) OR HIGHER *CHILLS OR SWEATING *NAUSEA AND VOMITING THAT IS NOT CONTROLLED WITH YOUR NAUSEA MEDICATION *UNUSUAL SHORTNESS OF BREATH *UNUSUAL BRUISING OR BLEEDING *URINARY PROBLEMS (pain or burning when urinating, or frequent urination) *BOWEL PROBLEMS (unusual diarrhea, constipation, pain near the anus) TENDERNESS IN MOUTH AND THROAT WITH OR WITHOUT PRESENCE OF ULCERS (sore throat, sores in mouth, or a toothache) UNUSUAL RASH, SWELLING OR PAIN  UNUSUAL VAGINAL DISCHARGE OR ITCHING   Items with * indicate a potential emergency and should be followed up as soon as possible or go to the Emergency Department if any problems should occur.  Please show the  CHEMOTHERAPY ALERT CARD or IMMUNOTHERAPY ALERT CARD at check-in to the Emergency Department and triage nurse.  Should you have questions after your visit or need to cancel or reschedule your appointment, please contact [facility] - A DEPT OF [facility]  Dept: [phone]  and follow the prompts.  Office hours are 8:00 a.m. to 4:30 p.m. Monday - Friday. Please note that voicemails left after 4:00 p.m. may not be returned until the following business day.  We are closed weekends and major holidays. You have access to a nurse at all times for urgent questions. Please call the main number to the clinic Dept: [phone] and follow the prompts.   For any non-urgent questions, you may also contact your provider using MyChart. We now offer e-Visits for anyone 65 and older to request care online for non-urgent symptoms. For details visit [email or web address].   Also download the MyChart app! Go to the app store, search MyChart, open the app, select , and log in with your MyChart username and password.

## 2024-04-01 NOTE — Progress Notes (Signed)
 [facility] CSW Progress Note  Visual Merchandiser met with patient to follow-up on food needs and coping. Pt is here for her first chemotherapy tx, accompanied by her sister.    Interventions: Provided bag from [facility] food pantry Provided brief coping support. Pt is still connected to her regular therapist through [facility]      Follow Up Plan:  Patient will contact CSW with any support or resource needs and CSW will provide bags from [facility] food pantry during chemo appts    Dallyn Bergland E Aubrei Bouchie, LCSW Clinical Social Worker [facility]    Patient is participating in a Managed Medicaid Plan:  Yes

## 2024-04-02 LAB — URINE CULTURE: Culture: >=100000 — AB

## 2024-04-03 DIAGNOSIS — Z5111 Encounter for antineoplastic chemotherapy: Secondary | ICD-10-CM | POA: Diagnosis not present

## 2024-04-04 ENCOUNTER — Ambulatory Visit
Admission: RE | Admit: 2024-04-04 | Discharge: 2024-04-04 | Disposition: A | Source: Ambulatory Visit | Attending: Radiation Oncology | Admitting: Radiation Oncology

## 2024-04-04 ENCOUNTER — Other Ambulatory Visit: Payer: Self-pay

## 2024-04-04 ENCOUNTER — Telehealth: Payer: Self-pay

## 2024-04-04 DIAGNOSIS — Z5111 Encounter for antineoplastic chemotherapy: Secondary | ICD-10-CM | POA: Diagnosis not present

## 2024-04-04 DIAGNOSIS — C538 Malignant neoplasm of overlapping sites of cervix uteri: Secondary | ICD-10-CM

## 2024-04-04 LAB — RAD ONC ARIA SESSION SUMMARY
Course Elapsed Days: 0
Plan Fractions Treated to Date: 1
Plan Prescribed Dose Per Fraction: 1.8 Gy
Plan Total Fractions Prescribed: 25
Plan Total Prescribed Dose: 45 Gy
Reference Point Dosage Given to Date: 1.8 Gy
Reference Point Session Dosage Given: 1.8 Gy
Session Number: 1

## 2024-04-04 NOTE — Telephone Encounter (Signed)
-----   Message from Nurse Rocky HERO, RN sent at 04/01/2024  5:02 PM EST ----- Regarding: First Time Keytruda /Cisplatin  - Dr. Lonn Patient First Time Keytruda /Cisplatin  - Dr. Loretha Patient Patient tolerated treatment well.

## 2024-04-05 ENCOUNTER — Ambulatory Visit
Admission: RE | Admit: 2024-04-05 | Discharge: 2024-04-05 | Disposition: A | Source: Ambulatory Visit | Attending: Radiation Oncology | Admitting: Radiation Oncology

## 2024-04-05 ENCOUNTER — Ambulatory Visit
Admission: RE | Admit: 2024-04-05 | Discharge: 2024-04-05 | Attending: Radiation Oncology | Admitting: Radiation Oncology

## 2024-04-05 ENCOUNTER — Other Ambulatory Visit: Payer: Self-pay

## 2024-04-05 DIAGNOSIS — Z5111 Encounter for antineoplastic chemotherapy: Secondary | ICD-10-CM | POA: Diagnosis not present

## 2024-04-05 LAB — RAD ONC ARIA SESSION SUMMARY
Course Elapsed Days: 1
Plan Fractions Treated to Date: 2
Plan Prescribed Dose Per Fraction: 1.8 Gy
Plan Total Fractions Prescribed: 25
Plan Total Prescribed Dose: 45 Gy
Reference Point Dosage Given to Date: 3.6 Gy
Reference Point Session Dosage Given: 1.8 Gy
Session Number: 2

## 2024-04-06 ENCOUNTER — Ambulatory Visit
Admission: RE | Admit: 2024-04-06 | Discharge: 2024-04-06 | Attending: Radiation Oncology | Admitting: Radiation Oncology

## 2024-04-06 ENCOUNTER — Other Ambulatory Visit: Payer: Self-pay

## 2024-04-06 DIAGNOSIS — Z5111 Encounter for antineoplastic chemotherapy: Secondary | ICD-10-CM | POA: Diagnosis not present

## 2024-04-06 LAB — RAD ONC ARIA SESSION SUMMARY
Course Elapsed Days: 2
Plan Fractions Treated to Date: 3
Plan Prescribed Dose Per Fraction: 1.8 Gy
Plan Total Fractions Prescribed: 25
Plan Total Prescribed Dose: 45 Gy
Reference Point Dosage Given to Date: 5.4 Gy
Reference Point Session Dosage Given: 1.8 Gy
Session Number: 3

## 2024-04-07 ENCOUNTER — Ambulatory Visit

## 2024-04-07 ENCOUNTER — Inpatient Hospital Stay

## 2024-04-07 ENCOUNTER — Other Ambulatory Visit: Payer: Self-pay

## 2024-04-07 DIAGNOSIS — C531 Malignant neoplasm of exocervix: Secondary | ICD-10-CM

## 2024-04-07 DIAGNOSIS — Z5111 Encounter for antineoplastic chemotherapy: Secondary | ICD-10-CM | POA: Diagnosis not present

## 2024-04-07 LAB — CBC WITH DIFFERENTIAL (CANCER CENTER ONLY)
Abs Immature Granulocytes: 0.02 K/uL (ref 0.00–0.07)
Basophils Absolute: 0.1 K/uL (ref 0.0–0.1)
Basophils Relative: 1 %
Eosinophils Absolute: 0.2 K/uL (ref 0.0–0.5)
Eosinophils Relative: 3 %
HCT: 41 % (ref 36.0–46.0)
Hemoglobin: 14.3 g/dL (ref 12.0–15.0)
Immature Granulocytes: 0 %
Lymphocytes Relative: 30 %
Lymphs Abs: 1.8 K/uL (ref 0.7–4.0)
MCH: 30.9 pg (ref 26.0–34.0)
MCHC: 34.9 g/dL (ref 30.0–36.0)
MCV: 88.6 fL (ref 80.0–100.0)
Monocytes Absolute: 0.5 K/uL (ref 0.1–1.0)
Monocytes Relative: 8 %
Neutro Abs: 3.5 K/uL (ref 1.7–7.7)
Neutrophils Relative %: 58 %
Platelet Count: 435 K/uL — ABNORMAL HIGH (ref 150–400)
RBC: 4.63 MIL/uL (ref 3.87–5.11)
RDW: 12.1 % (ref 11.5–15.5)
WBC Count: 6 K/uL (ref 4.0–10.5)
nRBC: 0 % (ref 0.0–0.2)

## 2024-04-07 LAB — RAD ONC ARIA SESSION SUMMARY
Course Elapsed Days: 3
Plan Fractions Treated to Date: 4
Plan Prescribed Dose Per Fraction: 1.8 Gy
Plan Total Fractions Prescribed: 25
Plan Total Prescribed Dose: 45 Gy
Reference Point Dosage Given to Date: 7.2 Gy
Reference Point Session Dosage Given: 1.8 Gy
Session Number: 4

## 2024-04-07 LAB — BASIC METABOLIC PANEL - CANCER CENTER ONLY
Anion gap: 12 (ref 5–15)
BUN: 18 mg/dL (ref 6–20)
CO2: 26 mmol/L (ref 22–32)
Calcium: 9.6 mg/dL (ref 8.9–10.3)
Chloride: 99 mmol/L (ref 98–111)
Creatinine: 1.36 mg/dL — ABNORMAL HIGH (ref 0.44–1.00)
GFR, Estimated: 52 mL/min — ABNORMAL LOW (ref >60)
Glucose, Bld: 108 mg/dL — ABNORMAL HIGH (ref 70–99)
Potassium: 4 mmol/L (ref 3.5–5.1)
Sodium: 136 mmol/L (ref 135–145)

## 2024-04-07 LAB — MAGNESIUM: Magnesium: 2.1 mg/dL (ref 1.7–2.4)

## 2024-04-07 MED FILL — Fosaprepitant Dimeglumine For IV Infusion 150 MG (Base Eq): INTRAVENOUS | Qty: 5 | Status: AC

## 2024-04-08 ENCOUNTER — Inpatient Hospital Stay

## 2024-04-08 ENCOUNTER — Ambulatory Visit
Admission: RE | Admit: 2024-04-08 | Discharge: 2024-04-08 | Attending: Radiation Oncology | Admitting: Radiation Oncology

## 2024-04-08 ENCOUNTER — Other Ambulatory Visit: Payer: Self-pay

## 2024-04-08 ENCOUNTER — Encounter: Payer: Self-pay | Admitting: Licensed Clinical Social Worker

## 2024-04-08 VITALS — BP 141/72 | HR 94 | Temp 98.2°F | Resp 16 | Wt 211.5 lb

## 2024-04-08 DIAGNOSIS — Z5111 Encounter for antineoplastic chemotherapy: Secondary | ICD-10-CM | POA: Diagnosis not present

## 2024-04-08 DIAGNOSIS — C531 Malignant neoplasm of exocervix: Secondary | ICD-10-CM

## 2024-04-08 LAB — RAD ONC ARIA SESSION SUMMARY
Course Elapsed Days: 4
Plan Fractions Treated to Date: 5
Plan Prescribed Dose Per Fraction: 1.8 Gy
Plan Total Fractions Prescribed: 25
Plan Total Prescribed Dose: 45 Gy
Reference Point Dosage Given to Date: 9 Gy
Reference Point Session Dosage Given: 1.8 Gy
Session Number: 5

## 2024-04-08 MED ORDER — POTASSIUM CHLORIDE IN NACL 20-0.9 MEQ/L-% IV SOLN
Freq: Once | INTRAVENOUS | Status: AC
  Filled 2024-04-08: qty 1000

## 2024-04-08 MED ORDER — PALONOSETRON HCL INJECTION 0.25 MG/5ML
0.2500 mg | Freq: Once | INTRAVENOUS | Status: AC
  Administered 2024-04-08: 0.25 mg via INTRAVENOUS
  Filled 2024-04-08: qty 5

## 2024-04-08 MED ORDER — SODIUM CHLORIDE 0.9 % IV SOLN: INTRAVENOUS | Status: DC

## 2024-04-08 MED ORDER — SODIUM CHLORIDE 0.9 % IV SOLN
150.0000 mg | Freq: Once | INTRAVENOUS | Status: AC
  Administered 2024-04-08: 150 mg via INTRAVENOUS
  Filled 2024-04-08: qty 150

## 2024-04-08 MED ORDER — SODIUM CHLORIDE 0.9% FLUSH: 10.0000 mL | INTRAVENOUS | Status: DC | PRN

## 2024-04-08 MED ORDER — SODIUM CHLORIDE 0.9 % IV SOLN
40.0000 mg/m2 | Freq: Once | INTRAVENOUS | Status: AC
  Administered 2024-04-08: 83 mg via INTRAVENOUS
  Filled 2024-04-08: qty 83

## 2024-04-08 MED ORDER — MAGNESIUM SULFATE 2 GM/50ML IV SOLN
2.0000 g | Freq: Once | INTRAVENOUS | Status: AC
  Administered 2024-04-08: 2 g via INTRAVENOUS
  Filled 2024-04-08: qty 50

## 2024-04-08 MED ORDER — DEXAMETHASONE SOD PHOSPHATE PF 10 MG/ML IJ SOLN
10.0000 mg | Freq: Once | INTRAMUSCULAR | Status: AC
  Administered 2024-04-08: 10 mg via INTRAVENOUS

## 2024-04-08 NOTE — Progress Notes (Signed)
 [facility] CSW Progress Note    Provided bag from [employer] pantry       Suhaib Guzzo E HELANE, LCSW Clinical Social Worker [facility]

## 2024-04-08 NOTE — Patient Instructions (Signed)

## 2024-04-10 ENCOUNTER — Ambulatory Visit
Admission: RE | Admit: 2024-04-10 | Discharge: 2024-04-10 | Attending: Radiation Oncology | Admitting: Radiation Oncology

## 2024-04-10 ENCOUNTER — Other Ambulatory Visit: Payer: Self-pay

## 2024-04-10 DIAGNOSIS — Z5111 Encounter for antineoplastic chemotherapy: Secondary | ICD-10-CM | POA: Diagnosis not present

## 2024-04-10 LAB — RAD ONC ARIA SESSION SUMMARY
Course Elapsed Days: 6
Plan Fractions Treated to Date: 6
Plan Prescribed Dose Per Fraction: 1.8 Gy
Plan Total Fractions Prescribed: 25
Plan Total Prescribed Dose: 45 Gy
Reference Point Dosage Given to Date: 10.8 Gy
Reference Point Session Dosage Given: 1.8 Gy
Session Number: 6

## 2024-04-11 ENCOUNTER — Other Ambulatory Visit: Payer: Self-pay

## 2024-04-11 ENCOUNTER — Ambulatory Visit
Admission: RE | Admit: 2024-04-11 | Discharge: 2024-04-11 | Attending: Radiation Oncology | Admitting: Radiation Oncology

## 2024-04-11 ENCOUNTER — Telehealth: Payer: Self-pay

## 2024-04-11 DIAGNOSIS — Z5111 Encounter for antineoplastic chemotherapy: Secondary | ICD-10-CM | POA: Diagnosis not present

## 2024-04-11 LAB — RAD ONC ARIA SESSION SUMMARY
Course Elapsed Days: 7
Plan Fractions Treated to Date: 7
Plan Prescribed Dose Per Fraction: 1.8 Gy
Plan Total Fractions Prescribed: 25
Plan Total Prescribed Dose: 45 Gy
Reference Point Dosage Given to Date: 12.6 Gy
Reference Point Session Dosage Given: 1.8 Gy
Session Number: 7

## 2024-04-11 NOTE — Telephone Encounter (Signed)
 Explained to the patient that her treatment plan is scheduled for every 7 days at this time. Informed her that, due to the holidays, both the provider and the providers nurse are currently away. After reviewing the notes and treatment timeline, the patient stated she was comfortable with the plan. She was advised to bring any additional questions to her next scheduled visit with the provider if anything remains unclear.

## 2024-04-12 ENCOUNTER — Ambulatory Visit
Admission: RE | Admit: 2024-04-12 | Discharge: 2024-04-12 | Disposition: A | Discharge status: Home / Self Care | Source: Ambulatory Visit | Attending: Radiation Oncology | Admitting: Radiation Oncology

## 2024-04-12 ENCOUNTER — Other Ambulatory Visit: Payer: Self-pay | Admitting: Radiation Oncology

## 2024-04-12 ENCOUNTER — Ambulatory Visit

## 2024-04-12 ENCOUNTER — Other Ambulatory Visit (HOSPITAL_COMMUNITY): Payer: Self-pay

## 2024-04-12 ENCOUNTER — Other Ambulatory Visit: Payer: Self-pay

## 2024-04-12 DIAGNOSIS — C538 Malignant neoplasm of overlapping sites of cervix uteri: Secondary | ICD-10-CM

## 2024-04-12 DIAGNOSIS — Z5111 Encounter for antineoplastic chemotherapy: Secondary | ICD-10-CM | POA: Diagnosis not present

## 2024-04-12 LAB — RAD ONC ARIA SESSION SUMMARY
Course Elapsed Days: 8
Plan Fractions Treated to Date: 8
Plan Prescribed Dose Per Fraction: 1.8 Gy
Plan Total Fractions Prescribed: 25
Plan Total Prescribed Dose: 45 Gy
Reference Point Dosage Given to Date: 14.4 Gy
Reference Point Session Dosage Given: 1.8 Gy
Session Number: 8

## 2024-04-12 MED ORDER — DIPHENOXYLATE-ATROPINE 2.5-0.025 MG PO TABS
1.0000 | ORAL_TABLET | Freq: Four times a day (QID) | ORAL | 0 refills | Status: DC | PRN
  Filled 2024-04-12: qty 30, 8d supply, fill #0

## 2024-04-13 ENCOUNTER — Ambulatory Visit
Admission: RE | Admit: 2024-04-13 | Discharge: 2024-04-13 | Disposition: A | Source: Ambulatory Visit | Attending: Radiation Oncology | Admitting: Radiation Oncology

## 2024-04-13 ENCOUNTER — Inpatient Hospital Stay

## 2024-04-13 ENCOUNTER — Other Ambulatory Visit: Payer: Self-pay

## 2024-04-13 DIAGNOSIS — C538 Malignant neoplasm of overlapping sites of cervix uteri: Secondary | ICD-10-CM

## 2024-04-13 DIAGNOSIS — Z5111 Encounter for antineoplastic chemotherapy: Secondary | ICD-10-CM | POA: Diagnosis not present

## 2024-04-13 LAB — RAD ONC ARIA SESSION SUMMARY
Course Elapsed Days: 9
Plan Fractions Treated to Date: 9
Plan Prescribed Dose Per Fraction: 1.8 Gy
Plan Total Fractions Prescribed: 25
Plan Total Prescribed Dose: 45 Gy
Reference Point Dosage Given to Date: 16.2 Gy
Reference Point Session Dosage Given: 1.8 Gy
Session Number: 9

## 2024-04-13 LAB — CBC WITH DIFFERENTIAL (CANCER CENTER ONLY)
Abs Immature Granulocytes: 0.01 K/uL (ref 0.00–0.07)
Basophils Absolute: 0 K/uL (ref 0.0–0.1)
Basophils Relative: 0 %
Eosinophils Absolute: 0.1 K/uL (ref 0.0–0.5)
Eosinophils Relative: 2 %
HCT: 36.3 % (ref 36.0–46.0)
Hemoglobin: 12.6 g/dL (ref 12.0–15.0)
Immature Granulocytes: 0 %
Lymphocytes Relative: 20 %
Lymphs Abs: 0.7 K/uL (ref 0.7–4.0)
MCH: 30.7 pg (ref 26.0–34.0)
MCHC: 34.7 g/dL (ref 30.0–36.0)
MCV: 88.5 fL (ref 80.0–100.0)
Monocytes Absolute: 0.4 K/uL (ref 0.1–1.0)
Monocytes Relative: 10 %
Neutro Abs: 2.4 K/uL (ref 1.7–7.7)
Neutrophils Relative %: 68 %
Platelet Count: 289 K/uL (ref 150–400)
RBC: 4.1 MIL/uL (ref 3.87–5.11)
RDW: 11.9 % (ref 11.5–15.5)
WBC Count: 3.6 K/uL — ABNORMAL LOW (ref 4.0–10.5)
nRBC: 0 % (ref 0.0–0.2)

## 2024-04-13 LAB — CMP (CANCER CENTER ONLY)
ALT: 22 U/L (ref 0–44)
AST: 19 U/L (ref 15–41)
Albumin: 4.2 g/dL (ref 3.5–5.0)
Alkaline Phosphatase: 69 U/L (ref 38–126)
Anion gap: 11 (ref 5–15)
BUN: 8 mg/dL (ref 6–20)
CO2: 26 mmol/L (ref 22–32)
Calcium: 9.2 mg/dL (ref 8.9–10.3)
Chloride: 101 mmol/L (ref 98–111)
Creatinine: 1 mg/dL (ref 0.44–1.00)
GFR, Estimated: >60 mL/min
Glucose, Bld: 122 mg/dL — ABNORMAL HIGH (ref 70–99)
Potassium: 3.5 mmol/L (ref 3.5–5.1)
Sodium: 137 mmol/L (ref 135–145)
Total Bilirubin: 0.7 mg/dL (ref 0.0–1.2)
Total Protein: 7 g/dL (ref 6.5–8.1)

## 2024-04-13 LAB — MAGNESIUM: Magnesium: 1.8 mg/dL (ref 1.7–2.4)

## 2024-04-13 MED ORDER — SODIUM CHLORIDE 0.9 % IV SOLN: INTRAVENOUS | Status: AC

## 2024-04-13 NOTE — Patient Instructions (Signed)

## 2024-04-18 ENCOUNTER — Other Ambulatory Visit: Payer: Self-pay

## 2024-04-18 ENCOUNTER — Ambulatory Visit
Admission: RE | Admit: 2024-04-18 | Discharge: 2024-04-18 | Disposition: A | Discharge status: Home / Self Care | Source: Ambulatory Visit | Attending: Radiation Oncology | Admitting: Radiation Oncology

## 2024-04-18 DIAGNOSIS — Z5111 Encounter for antineoplastic chemotherapy: Secondary | ICD-10-CM | POA: Diagnosis not present

## 2024-04-18 LAB — RAD ONC ARIA SESSION SUMMARY
Course Elapsed Days: 14
Plan Fractions Treated to Date: 10
Plan Prescribed Dose Per Fraction: 1.8 Gy
Plan Total Fractions Prescribed: 25
Plan Total Prescribed Dose: 45 Gy
Reference Point Dosage Given to Date: 18 Gy
Reference Point Session Dosage Given: 1.8 Gy
Session Number: 10

## 2024-04-19 ENCOUNTER — Ambulatory Visit
Admission: RE | Admit: 2024-04-19 | Discharge: 2024-04-19 | Disposition: A | Discharge status: Home / Self Care | Source: Ambulatory Visit | Attending: Radiation Oncology | Admitting: Radiation Oncology

## 2024-04-19 ENCOUNTER — Other Ambulatory Visit: Payer: Self-pay | Admitting: Radiation Oncology

## 2024-04-19 ENCOUNTER — Other Ambulatory Visit: Payer: Self-pay

## 2024-04-19 ENCOUNTER — Other Ambulatory Visit (HOSPITAL_COMMUNITY): Payer: Self-pay

## 2024-04-19 DIAGNOSIS — Z5111 Encounter for antineoplastic chemotherapy: Secondary | ICD-10-CM | POA: Diagnosis not present

## 2024-04-19 LAB — RAD ONC ARIA SESSION SUMMARY
Course Elapsed Days: 15
Plan Fractions Treated to Date: 11
Plan Prescribed Dose Per Fraction: 1.8 Gy
Plan Total Fractions Prescribed: 25
Plan Total Prescribed Dose: 45 Gy
Reference Point Dosage Given to Date: 19.8 Gy
Reference Point Session Dosage Given: 1.8 Gy
Session Number: 11

## 2024-04-19 MED ORDER — DIPHENOXYLATE-ATROPINE 2.5-0.025 MG PO TABS
1.0000 | ORAL_TABLET | Freq: Four times a day (QID) | ORAL | 0 refills | Status: DC | PRN
  Filled 2024-04-19: qty 60, 8d supply, fill #0

## 2024-04-20 ENCOUNTER — Inpatient Hospital Stay

## 2024-04-20 ENCOUNTER — Other Ambulatory Visit: Payer: Self-pay

## 2024-04-20 ENCOUNTER — Ambulatory Visit
Admission: RE | Admit: 2024-04-20 | Discharge: 2024-04-20 | Disposition: A | Discharge status: Home / Self Care | Source: Ambulatory Visit | Attending: Radiation Oncology | Admitting: Radiation Oncology

## 2024-04-20 DIAGNOSIS — C531 Malignant neoplasm of exocervix: Secondary | ICD-10-CM

## 2024-04-20 DIAGNOSIS — Z5111 Encounter for antineoplastic chemotherapy: Secondary | ICD-10-CM | POA: Diagnosis not present

## 2024-04-20 LAB — RAD ONC ARIA SESSION SUMMARY
Course Elapsed Days: 16
Plan Fractions Treated to Date: 12
Plan Prescribed Dose Per Fraction: 1.8 Gy
Plan Total Fractions Prescribed: 25
Plan Total Prescribed Dose: 45 Gy
Reference Point Dosage Given to Date: 21.6 Gy
Reference Point Session Dosage Given: 1.8 Gy
Session Number: 12

## 2024-04-20 LAB — CBC WITH DIFFERENTIAL (CANCER CENTER ONLY)
Abs Immature Granulocytes: 0.01 K/uL (ref 0.00–0.07)
Basophils Absolute: 0 K/uL (ref 0.0–0.1)
Basophils Relative: 1 %
Eosinophils Absolute: 0.1 K/uL (ref 0.0–0.5)
Eosinophils Relative: 3 %
HCT: 34 % — ABNORMAL LOW (ref 36.0–46.0)
Hemoglobin: 11.7 g/dL — ABNORMAL LOW (ref 12.0–15.0)
Immature Granulocytes: 0 %
Lymphocytes Relative: 21 %
Lymphs Abs: 0.9 K/uL (ref 0.7–4.0)
MCH: 30.9 pg (ref 26.0–34.0)
MCHC: 34.4 g/dL (ref 30.0–36.0)
MCV: 89.7 fL (ref 80.0–100.0)
Monocytes Absolute: 0.5 K/uL (ref 0.1–1.0)
Monocytes Relative: 11 %
Neutro Abs: 2.7 K/uL (ref 1.7–7.7)
Neutrophils Relative %: 64 %
Platelet Count: 220 K/uL (ref 150–400)
RBC: 3.79 MIL/uL — ABNORMAL LOW (ref 3.87–5.11)
RDW: 12.7 % (ref 11.5–15.5)
WBC Count: 4.1 K/uL (ref 4.0–10.5)
nRBC: 0 % (ref 0.0–0.2)

## 2024-04-20 LAB — BASIC METABOLIC PANEL - CANCER CENTER ONLY
Anion gap: 10 (ref 5–15)
BUN: 14 mg/dL (ref 6–20)
CO2: 26 mmol/L (ref 22–32)
Calcium: 9.2 mg/dL (ref 8.9–10.3)
Chloride: 103 mmol/L (ref 98–111)
Creatinine: 0.87 mg/dL (ref 0.44–1.00)
GFR, Estimated: >60 mL/min
Glucose, Bld: 91 mg/dL (ref 70–99)
Potassium: 3.8 mmol/L (ref 3.5–5.1)
Sodium: 138 mmol/L (ref 135–145)

## 2024-04-20 LAB — MAGNESIUM: Magnesium: 1.9 mg/dL (ref 1.7–2.4)

## 2024-04-21 ENCOUNTER — Encounter: Payer: Self-pay | Admitting: Hematology and Oncology

## 2024-04-22 ENCOUNTER — Encounter: Payer: Self-pay | Admitting: Hematology and Oncology

## 2024-04-22 ENCOUNTER — Other Ambulatory Visit (HOSPITAL_COMMUNITY): Payer: Self-pay

## 2024-04-22 ENCOUNTER — Inpatient Hospital Stay: Attending: Gynecologic Oncology

## 2024-04-22 ENCOUNTER — Ambulatory Visit
Admission: RE | Admit: 2024-04-22 | Discharge: 2024-04-22 | Disposition: A | Discharge status: Home / Self Care | Source: Ambulatory Visit | Attending: Radiation Oncology | Admitting: Radiation Oncology

## 2024-04-22 ENCOUNTER — Other Ambulatory Visit: Payer: Self-pay

## 2024-04-22 ENCOUNTER — Inpatient Hospital Stay: Admitting: Hematology and Oncology

## 2024-04-22 ENCOUNTER — Inpatient Hospital Stay: Admitting: Licensed Clinical Social Worker

## 2024-04-22 ENCOUNTER — Ambulatory Visit: Admitting: Gynecologic Oncology

## 2024-04-22 ENCOUNTER — Encounter (HOSPITAL_COMMUNITY): Payer: Self-pay

## 2024-04-22 VITALS — BP 125/72 | HR 78 | Temp 98.6°F | Resp 16 | Wt 219.2 lb

## 2024-04-22 DIAGNOSIS — Z5111 Encounter for antineoplastic chemotherapy: Secondary | ICD-10-CM | POA: Insufficient documentation

## 2024-04-22 DIAGNOSIS — D6481 Anemia due to antineoplastic chemotherapy: Secondary | ICD-10-CM

## 2024-04-22 DIAGNOSIS — C538 Malignant neoplasm of overlapping sites of cervix uteri: Secondary | ICD-10-CM

## 2024-04-22 DIAGNOSIS — R112 Nausea with vomiting, unspecified: Secondary | ICD-10-CM | POA: Diagnosis not present

## 2024-04-22 DIAGNOSIS — R102 Pelvic and perineal pain unspecified side: Secondary | ICD-10-CM | POA: Insufficient documentation

## 2024-04-22 DIAGNOSIS — Z79620 Long term (current) use of immunosuppressive biologic: Secondary | ICD-10-CM | POA: Insufficient documentation

## 2024-04-22 DIAGNOSIS — C539 Malignant neoplasm of cervix uteri, unspecified: Secondary | ICD-10-CM | POA: Insufficient documentation

## 2024-04-22 DIAGNOSIS — R634 Abnormal weight loss: Secondary | ICD-10-CM | POA: Diagnosis not present

## 2024-04-22 DIAGNOSIS — Z510 Encounter for antineoplastic radiation therapy: Secondary | ICD-10-CM | POA: Insufficient documentation

## 2024-04-22 DIAGNOSIS — T451X5A Adverse effect of antineoplastic and immunosuppressive drugs, initial encounter: Secondary | ICD-10-CM | POA: Diagnosis not present

## 2024-04-22 DIAGNOSIS — C531 Malignant neoplasm of exocervix: Secondary | ICD-10-CM

## 2024-04-22 DIAGNOSIS — Z79899 Other long term (current) drug therapy: Secondary | ICD-10-CM | POA: Insufficient documentation

## 2024-04-22 DIAGNOSIS — Z87891 Personal history of nicotine dependence: Secondary | ICD-10-CM | POA: Insufficient documentation

## 2024-04-22 LAB — RAD ONC ARIA SESSION SUMMARY
Course Elapsed Days: 18
Plan Fractions Treated to Date: 13
Plan Prescribed Dose Per Fraction: 1.8 Gy
Plan Total Fractions Prescribed: 25
Plan Total Prescribed Dose: 45 Gy
Reference Point Dosage Given to Date: 23.4 Gy
Reference Point Session Dosage Given: 1.8 Gy
Session Number: 13

## 2024-04-22 MED ORDER — MAGNESIUM SULFATE 2 GM/50ML IV SOLN
2.0000 g | Freq: Once | INTRAVENOUS | Status: AC
  Administered 2024-04-22: 2 g via INTRAVENOUS
  Filled 2024-04-22: qty 50

## 2024-04-22 MED ORDER — SODIUM CHLORIDE 0.9 % IV SOLN: INTRAVENOUS | Status: DC

## 2024-04-22 MED ORDER — SODIUM CHLORIDE 0.9 % IV SOLN
150.0000 mg | Freq: Once | INTRAVENOUS | Status: AC
  Administered 2024-04-22: 150 mg via INTRAVENOUS
  Filled 2024-04-22: qty 150

## 2024-04-22 MED ORDER — DEXAMETHASONE SOD PHOSPHATE PF 10 MG/ML IJ SOLN
10.0000 mg | Freq: Once | INTRAMUSCULAR | Status: AC
  Administered 2024-04-22: 10 mg via INTRAVENOUS

## 2024-04-22 MED ORDER — SODIUM CHLORIDE 0.9 % IV SOLN
40.0000 mg/m2 | Freq: Once | INTRAVENOUS | Status: AC
  Administered 2024-04-22: 83 mg via INTRAVENOUS
  Filled 2024-04-22: qty 83

## 2024-04-22 MED ORDER — DEXAMETHASONE 4 MG PO TABS
ORAL_TABLET | ORAL | 0 refills | Status: AC
  Filled 2024-04-22: qty 20, 30d supply, fill #0

## 2024-04-22 MED ORDER — POTASSIUM CHLORIDE IN NACL 20-0.9 MEQ/L-% IV SOLN
Freq: Once | INTRAVENOUS | Status: AC
  Filled 2024-04-22: qty 1000

## 2024-04-22 MED ORDER — ACETAMINOPHEN 500 MG PO TABS
1000.0000 mg | ORAL_TABLET | Freq: Once | ORAL | Status: AC
  Administered 2024-04-22: 1000 mg via ORAL
  Filled 2024-04-22: qty 2

## 2024-04-22 MED ORDER — PALONOSETRON HCL INJECTION 0.25 MG/5ML
0.2500 mg | Freq: Once | INTRAVENOUS | Status: AC
  Administered 2024-04-22: 0.25 mg via INTRAVENOUS
  Filled 2024-04-22: qty 5

## 2024-04-22 NOTE — Assessment & Plan Note (Addendum)
 Disease status: Undetermined Evidence: Symptoms  The patient has abnormal Pap smear dated back to 2017.  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  Subsequent follow-up in October confirmed likelihood of recurrent disease.  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  There is present of lymphovascular invasion and perineural invasion  She is still recovering from surgery We discussed the role of adjuvant treatment Her previous PD-L1 test from 2024 was negative, repeat PD-L1 on her latest tissue from October 2025 was 40% positive  She tolerated treatment well except for nausea and vomiting after each cycle of treatment She is noted to have lost some weight I plan future dose reduction of cisplatin  I will readjust the dose of her treatment based on recent weight loss I plan to repeat imaging study after completion of cisplatin  portion of her treatment

## 2024-04-22 NOTE — Assessment & Plan Note (Addendum)
 This is likely due to recent treatment. The patient denies recent history of bleeding such as epistaxis, hematuria or hematochezia. She is asymptomatic from the anemia. I will observe for now.  She does not require transfusion now. I will continue the chemotherapy at current dose without dosage adjustment.  If the anemia gets progressive worse in the future, I might have to delay her treatment or adjust the chemotherapy dose.

## 2024-04-22 NOTE — Assessment & Plan Note (Addendum)
 Due to recent nausea and vomiting Will adjust the dose as above

## 2024-04-22 NOTE — Assessment & Plan Note (Addendum)
 This is due to her treatment I will order dexamethasone  to be taken 2 days after each cycle of treatment She will continue antiemetics

## 2024-04-22 NOTE — Progress Notes (Signed)
 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  Subsequent follow-up in October confirmed likelihood of recurrent disease.  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  There is present of lymphovascular invasion and perineural invasion  She is still recovering from surgery We discussed the role of adjuvant treatment Her previous PD-L1 test from 2024 was negative, repeat PD-L1 on her latest tissue from October 2025 was 40% positive  She tolerated treatment well except for nausea and vomiting after each cycle of treatment She is noted to have lost some weight I plan future dose reduction of cisplatin  I will readjust the dose of her treatment based on recent weight loss I plan to repeat imaging study after completion of cisplatin  portion of her treatment Nausea and vomiting, unspecified vomiting type This is due to her treatment I will order dexamethasone  to be taken 2 days after each cycle of treatment She will continue antiemetics Weight loss Due to recent nausea and vomiting Will adjust the dose as above Anemia due to antineoplastic chemotherapy This is likely due to recent treatment. The patient denies recent history of  bleeding such as epistaxis, hematuria or hematochezia. She is asymptomatic from the anemia. I will observe for now.  She does not require transfusion now. I will continue the chemotherapy at current dose without dosage adjustment.  If the anemia gets progressive worse in the future, I might have to delay her treatment or adjust the chemotherapy dose.   No orders of the defined types were placed in this encounter.    Holly Bedford, MD  INTERVAL HISTORY: she returns for treatment follow-up Complications related to previous cycle of chemotherapy included anemia,, nausea with/without vomiting,, weight loss,, and intermittent tinnitus She is noted to have 7% difference in weight due to recent weight loss from nausea and vomiting Tinnitus is not bad She denies recent bleeding  PHYSICAL EXAMINATION: ECOG PERFORMANCE STATUS: 1 - Symptomatic but completely ambulatory  No results found for: CAN125    Latest Ref Rng & Units 04/20/2024    1:57 PM 04/13/2024    8:42 AM 04/07/2024    3:00 PM  CBC  WBC 4.0 - 10.5 K/uL 4.1  3.6  6.0   Hemoglobin 12.0 - 15.0 g/dL 88.2  87.3  85.6   Hematocrit 36.0 - 46.0 % 34.0  36.3  41.0   Platelets 150 - 400 K/uL 220  289  435       Chemistry      Component Value Date/Time   NA 138 04/20/2024 1357   NA 139 10/10/2015 1623   K 3.8 04/20/2024 1357   CL 103 04/20/2024 1357   CO2 26 04/20/2024 1357   BUN 14 04/20/2024 1357   BUN 7 10/10/2015 1623   CREATININE 0.87 04/20/2024 1357   CREATININE 0.82 12/16/2023 1019  Component Value Date/Time   CALCIUM  9.2 04/20/2024 1357   ALKPHOS 69 04/13/2024 0842   AST 19 04/13/2024 0842   ALT 22 04/13/2024 0842   BILITOT 0.7 04/13/2024 0842       There were no vitals filed for this visit. There were no vitals filed for this visit. Other relevant data reviewed during this visit included CBC and BMP

## 2024-04-22 NOTE — Progress Notes (Addendum)
 Last Keytruda  dose was on 12/12 - Per Dr. Lonn, okay to wait to receive Keytruda  until next week with Cycle 2 Day 1. Also per MD, okay to proceed with cisplatin  83 mg today.  Harlene Nasuti, PharmD Oncology Infusion Pharmacist 04/22/2024 10:28 AM

## 2024-04-22 NOTE — Addendum Note (Signed)
 Encounter addended by: Janice Lynwood BROCKS on: 04/22/2024 8:33 AM  Actions taken: Imaging Exam ended

## 2024-04-22 NOTE — Patient Instructions (Signed)

## 2024-04-22 NOTE — Progress Notes (Signed)
 [facility] CSW Progress Note  Visual Merchandiser met with patient to follow-up on need for community resources.    Interventions: Provided bag from [facility] food pantry      Follow Up Plan:  Patient will contact CSW with any support or resource needs. CSW will continue to provide food bags as needed    Holly Haywood E Milad Bublitz, LCSW Clinical Social Worker [facility]

## 2024-04-24 ENCOUNTER — Other Ambulatory Visit: Payer: Self-pay

## 2024-04-24 ENCOUNTER — Observation Stay (HOSPITAL_COMMUNITY)
Admission: EM | Admit: 2024-04-24 | Discharge: 2024-04-26 | Disposition: A | Discharge status: Home / Self Care | Attending: Emergency Medicine | Admitting: Emergency Medicine

## 2024-04-24 ENCOUNTER — Encounter (HOSPITAL_COMMUNITY): Payer: Self-pay

## 2024-04-24 ENCOUNTER — Encounter: Payer: Self-pay | Admitting: Hematology and Oncology

## 2024-04-24 DIAGNOSIS — E66811 Obesity, class 1: Secondary | ICD-10-CM | POA: Insufficient documentation

## 2024-04-24 DIAGNOSIS — Z79899 Other long term (current) drug therapy: Secondary | ICD-10-CM | POA: Insufficient documentation

## 2024-04-24 DIAGNOSIS — R748 Abnormal levels of other serum enzymes: Secondary | ICD-10-CM | POA: Insufficient documentation

## 2024-04-24 DIAGNOSIS — N390 Urinary tract infection, site not specified: Principal | ICD-10-CM | POA: Insufficient documentation

## 2024-04-24 DIAGNOSIS — R5383 Other fatigue: Principal | ICD-10-CM

## 2024-04-24 DIAGNOSIS — E669 Obesity, unspecified: Secondary | ICD-10-CM | POA: Insufficient documentation

## 2024-04-24 DIAGNOSIS — N3289 Other specified disorders of bladder: Secondary | ICD-10-CM

## 2024-04-24 DIAGNOSIS — Z90710 Acquired absence of both cervix and uterus: Secondary | ICD-10-CM | POA: Diagnosis present

## 2024-04-24 DIAGNOSIS — K859 Acute pancreatitis without necrosis or infection, unspecified: Secondary | ICD-10-CM | POA: Insufficient documentation

## 2024-04-24 DIAGNOSIS — D500 Iron deficiency anemia secondary to blood loss (chronic): Secondary | ICD-10-CM | POA: Insufficient documentation

## 2024-04-24 DIAGNOSIS — B9620 Unspecified Escherichia coli [E. coli] as the cause of diseases classified elsewhere: Secondary | ICD-10-CM | POA: Insufficient documentation

## 2024-04-24 DIAGNOSIS — Z87891 Personal history of nicotine dependence: Secondary | ICD-10-CM | POA: Insufficient documentation

## 2024-04-24 DIAGNOSIS — C539 Malignant neoplasm of cervix uteri, unspecified: Secondary | ICD-10-CM | POA: Diagnosis present

## 2024-04-24 DIAGNOSIS — Z8541 Personal history of malignant neoplasm of cervix uteri: Secondary | ICD-10-CM | POA: Insufficient documentation

## 2024-04-24 DIAGNOSIS — D649 Anemia, unspecified: Secondary | ICD-10-CM | POA: Insufficient documentation

## 2024-04-24 LAB — CBC WITH DIFFERENTIAL/PLATELET
Abs Immature Granulocytes: 0.02 K/uL (ref 0.00–0.07)
Basophils Absolute: 0 K/uL (ref 0.0–0.1)
Basophils Relative: 0 %
Eosinophils Absolute: 0 K/uL (ref 0.0–0.5)
Eosinophils Relative: 0 %
HCT: 33 % — ABNORMAL LOW (ref 36.0–46.0)
Hemoglobin: 11 g/dL — ABNORMAL LOW (ref 12.0–15.0)
Immature Granulocytes: 0 %
Lymphocytes Relative: 7 %
Lymphs Abs: 0.5 K/uL — ABNORMAL LOW (ref 0.7–4.0)
MCH: 31.1 pg (ref 26.0–34.0)
MCHC: 33.3 g/dL (ref 30.0–36.0)
MCV: 93.2 fL (ref 80.0–100.0)
Monocytes Absolute: 0.7 K/uL (ref 0.1–1.0)
Monocytes Relative: 11 %
Neutro Abs: 5.2 K/uL (ref 1.7–7.7)
Neutrophils Relative %: 82 %
Platelets: 204 K/uL (ref 150–400)
RBC: 3.54 MIL/uL — ABNORMAL LOW (ref 3.87–5.11)
RDW: 13.3 % (ref 11.5–15.5)
WBC: 6.4 K/uL (ref 4.0–10.5)
nRBC: 0 % (ref 0.0–0.2)

## 2024-04-24 LAB — COMPREHENSIVE METABOLIC PANEL WITH GFR
ALT: 25 U/L (ref 0–44)
AST: 15 U/L (ref 15–41)
Albumin: 3.7 g/dL (ref 3.5–5.0)
Alkaline Phosphatase: 63 U/L (ref 38–126)
Anion gap: 9 (ref 5–15)
BUN: 14 mg/dL (ref 6–20)
CO2: 25 mmol/L (ref 22–32)
Calcium: 9.1 mg/dL (ref 8.9–10.3)
Chloride: 102 mmol/L (ref 98–111)
Creatinine, Ser: 0.85 mg/dL (ref 0.44–1.00)
GFR, Estimated: >60 mL/min
Glucose, Bld: 135 mg/dL — ABNORMAL HIGH (ref 70–99)
Potassium: 3.6 mmol/L (ref 3.5–5.1)
Sodium: 137 mmol/L (ref 135–145)
Total Bilirubin: 0.4 mg/dL (ref 0.0–1.2)
Total Protein: 6.4 g/dL — ABNORMAL LOW (ref 6.5–8.1)

## 2024-04-24 LAB — LIPASE, BLOOD: Lipase: 235 U/L — ABNORMAL HIGH (ref 11–51)

## 2024-04-24 MED ORDER — LACTATED RINGERS IV BOLUS
1000.0000 mL | Freq: Once | INTRAVENOUS | Status: AC
  Administered 2024-04-24: 1000 mL via INTRAVENOUS

## 2024-04-24 NOTE — ED Provider Notes (Signed)
 " [place] EMERGENCY DEPARTMENT AT [facility] Provider Note   CSN: [id number] Arrival date & time: 04/24/24  2152     Patient presents with: Fatigue   Holly Brown is a 39 y.o. 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  Denies fevers, chills, nausea, vomiting at this time.  Per chart review from oncology, patient has had some weight loss recently secondary to treatment and plan is for dose reduction of her cisplatin  moving forward.  Did initiate Decadron  over the weekend for chemotherapy side effects.   {Add pertinent medical, surgical, social history, OB history to HPI:32947} HPI     Prior to Admission medications  Medication Sig Start Date End Date Taking? Authorizing Provider  acetaminophen  (TYLENOL ) 500 MG tablet Take 1,000 mg by mouth every 6 (six) hours as needed for moderate pain (pain score 4-6), fever or headache.    [provider]  dexamethasone  (DECADRON ) 4 MG tablet Take 2 tablets by mouth daily for 2 days after chemo every week x 4 weeks 04/22/24   Lonn Hicks, MD  diphenoxylate -atropine  (LOMOTIL ) 2.5-0.025 MG tablet Take 1 tablet by mouth 4 (four) times daily as needed for diarrhea or loose stools. Do not exceed 8 tablets per day 04/19/24   Shannon Agent, MD  lidocaine -prilocaine  (EMLA ) cream Apply to affected area once 03/31/24   Lonn Hicks, MD  omeprazole  (PRILOSEC OTC) 20 MG tablet Take 20 mg by mouth daily.    [provider]  ondansetron  (ZOFRAN ) 8 MG tablet Take 1 tablet (8 mg total) by mouth every 8 hours as needed for nausea or vomiting. Start on the third day after cisplatin . 03/31/24   Lonn Hicks, MD  oxybutynin  (DITROPAN  XL) 10 MG 24 hr tablet Take 1  tablet (10 mg total) by mouth at bedtime. 02/21/24   Keith, Kayla N, PA-C  phenazopyridine  (PYRIDIUM ) 100 MG tablet Take 1 tablet (100 mg total) by mouth 3 (three) times daily as needed for pain. 03/11/24   Tucker, Katherine R, MD  polyethylene glycol powder (GLYCOLAX /MIRALAX ) 17 GM/SCOOP powder Take 233.75 g by mouth once for 1 dose Take according to your procedure prep instructions. 02/03/24     prochlorperazine  (COMPAZINE ) 10 MG tablet Take 1 tablet (10 mg total) by mouth every 6 (six) hours as needed for nausea or vomiting. 03/31/24   Lonn Hicks, MD    Allergies: Keflex  [cephalexin ]    Review of Systems  Constitutional:  Positive for activity change, appetite change and fatigue. Negative for chills and fever.  HENT: Negative.    Respiratory: Negative.    Cardiovascular: Negative.   Gastrointestinal: Negative.   Neurological: Negative.     Updated Vital Signs BP (!) 110/59   Pulse 71   Temp 97.9 F (36.6 C) (Oral)   Resp 17   LMP  (LMP Unknown) Comment: Depo Injections - last injection 10/2022, had spotting on and off since 10/2022 injection  SpO2 100%   Physical Exam Vitals and nursing note reviewed.  Constitutional:      Appearance: She is not ill-appearing or toxic-appearing.  HENT:     Head: Normocephalic and atraumatic.     Mouth/Throat:     Mouth: Mucous membranes are moist.     Pharynx: No oropharyngeal exudate or  posterior oropharyngeal erythema.  Eyes:     General:        Right eye: No discharge.        Left eye: No discharge.     Extraocular Movements: Extraocular movements intact.     Conjunctiva/sclera: Conjunctivae normal.     Pupils: Pupils are equal, round, and reactive to light.  Cardiovascular:     Rate and Rhythm: Normal rate and regular rhythm.     Pulses: Normal pulses.     Heart sounds: Normal heart sounds. No murmur heard. Pulmonary:     Effort: Pulmonary effort is normal. No respiratory distress.     Breath sounds: Normal breath sounds. No  wheezing or rales.  Abdominal:     General: Bowel sounds are normal. There is no distension.     Palpations: Abdomen is soft.     Tenderness: There is no abdominal tenderness. There is no guarding or rebound.  Musculoskeletal:        General: No deformity.     Cervical back: Neck supple.     Right lower leg: No edema.     Left lower leg: No edema.  Skin:    General: Skin is warm and dry.     Capillary Refill: Capillary refill takes less than 2 seconds.  Neurological:     General: No focal deficit present.     Mental Status: She is alert and oriented to person, place, and time. Mental status is at baseline.  Psychiatric:        Mood and Affect: Mood normal. Affect is flat.        Behavior: Behavior is withdrawn.     (all labs ordered are listed, but only abnormal results are displayed) Labs Reviewed - No data to display  EKG: None  Radiology: No results found.  {Document cardiac monitor, telemetry assessment procedure when appropriate:32947} Procedures   Medications Ordered in the ED - No data to display    {Click here for ABCD2, HEART and other calculators REFRESH Note before signing:1}                              Medical Decision Making 39 y/o female who presents with concern for profound fatigue following recent chemotherapy.   VS normal on intake, cardiopulmonary exam unremarkable, abdominal exam is benign. No LE edema.   Amount and/or Complexity of Data Reviewed Labs: ordered.   ***  {Document critical care time when appropriate  Document review of labs and clinical decision tools ie CHADS2VASC2, etc  Document your independent review of radiology images and any outside records  Document your discussion with family members, caretakers and with consultants  Document social determinants of health affecting pt's care  Document your decision making why or why not admission, treatments were needed:32947:::1}   Final diagnoses:  None    ED Discharge Orders      None        "

## 2024-04-24 NOTE — ED Triage Notes (Signed)
 Pt had radiation treatment on 04/22/24 and has had extreme fatigue/weakness since. Pt reports nausea and vomiting and decreased p.o. intake.

## 2024-04-25 ENCOUNTER — Ambulatory Visit

## 2024-04-25 ENCOUNTER — Other Ambulatory Visit: Payer: Self-pay

## 2024-04-25 ENCOUNTER — Emergency Department (HOSPITAL_COMMUNITY)

## 2024-04-25 DIAGNOSIS — N390 Urinary tract infection, site not specified: Secondary | ICD-10-CM | POA: Diagnosis present

## 2024-04-25 LAB — RAD ONC ARIA SESSION SUMMARY
Course Elapsed Days: 21
Plan Fractions Treated to Date: 14
Plan Prescribed Dose Per Fraction: 1.8 Gy
Plan Total Fractions Prescribed: 25
Plan Total Prescribed Dose: 45 Gy
Reference Point Dosage Given to Date: 25.2 Gy
Reference Point Session Dosage Given: 1.8 Gy
Session Number: 14

## 2024-04-25 LAB — URINALYSIS, ROUTINE W REFLEX MICROSCOPIC
Bilirubin Urine: NEGATIVE
Glucose, UA: >=500 mg/dL — AB
Hgb urine dipstick: NEGATIVE
Ketones, ur: NEGATIVE mg/dL
Nitrite: POSITIVE — AB
Protein, ur: NEGATIVE mg/dL
Specific Gravity, Urine: 1.016 (ref 1.005–1.030)
WBC, UA: >50 WBC/hpf (ref 0–5)
pH: 5 (ref 5.0–8.0)

## 2024-04-25 MED ORDER — LACTATED RINGERS IV BOLUS
1000.0000 mL | Freq: Once | INTRAVENOUS | Status: AC
  Administered 2024-04-25: 1000 mL via INTRAVENOUS

## 2024-04-25 MED ORDER — ONDANSETRON HCL 4 MG/2ML IJ SOLN
4.0000 mg | Freq: Four times a day (QID) | INTRAMUSCULAR | Status: DC | PRN
  Filled 2024-04-25: qty 2

## 2024-04-25 MED ORDER — ENOXAPARIN SODIUM 40 MG/0.4ML IJ SOSY
40.0000 mg | PREFILLED_SYRINGE | INTRAMUSCULAR | Status: DC
  Administered 2024-04-25: 40 mg via SUBCUTANEOUS
  Filled 2024-04-25 (×2): qty 0.4

## 2024-04-25 MED ORDER — HYDROMORPHONE HCL 1 MG/ML IJ SOLN
0.5000 mg | INTRAMUSCULAR | Status: DC | PRN
  Administered 2024-04-25: 0.5 mg via INTRAVENOUS
  Filled 2024-04-25: qty 1

## 2024-04-25 MED ORDER — ALBUTEROL SULFATE (2.5 MG/3ML) 0.083% IN NEBU: 2.5000 mg | INHALATION_SOLUTION | RESPIRATORY_TRACT | Status: DC | PRN

## 2024-04-25 MED ORDER — DIPHENHYDRAMINE HCL 50 MG/ML IJ SOLN
25.0000 mg | Freq: Once | INTRAMUSCULAR | Status: AC
  Administered 2024-04-25: 25 mg via INTRAVENOUS
  Filled 2024-04-25: qty 1

## 2024-04-25 MED ORDER — SODIUM CHLORIDE 0.9% FLUSH
10.0000 mL | Freq: Two times a day (BID) | INTRAVENOUS | Status: DC
  Administered 2024-04-25: 10 mL

## 2024-04-25 MED ORDER — ACETAMINOPHEN 650 MG RE SUPP: 650.0000 mg | Freq: Four times a day (QID) | RECTAL | Status: DC | PRN

## 2024-04-25 MED ORDER — SODIUM CHLORIDE 0.9% FLUSH: 10.0000 mL | INTRAVENOUS | Status: DC | PRN

## 2024-04-25 MED ORDER — CHLORHEXIDINE GLUCONATE CLOTH 2 % EX PADS
6.0000 | MEDICATED_PAD | Freq: Every day | CUTANEOUS | Status: DC
  Administered 2024-04-25: 6 via TOPICAL

## 2024-04-25 MED ORDER — LACTATED RINGERS IV SOLN: INTRAVENOUS | Status: AC

## 2024-04-25 MED ORDER — DIPHENOXYLATE-ATROPINE 2.5-0.025 MG PO TABS
1.0000 | ORAL_TABLET | Freq: Four times a day (QID) | ORAL | Status: DC | PRN
  Administered 2024-04-26: 1 via ORAL
  Filled 2024-04-25: qty 1

## 2024-04-25 MED ORDER — SODIUM CHLORIDE 0.9 % IV SOLN
3.0000 g | Freq: Once | INTRAVENOUS | Status: AC
  Administered 2024-04-25: 3 g via INTRAVENOUS
  Filled 2024-04-25: qty 8

## 2024-04-25 MED ORDER — SODIUM CHLORIDE 0.9 % IV SOLN
2.0000 g | Freq: Once | INTRAVENOUS | Status: DC
  Filled 2024-04-25: qty 20

## 2024-04-25 MED ORDER — SODIUM CHLORIDE 0.9 % IV SOLN
1.5000 g | Freq: Four times a day (QID) | INTRAVENOUS | Status: DC
  Administered 2024-04-25 – 2024-04-26 (×5): 1.5 g via INTRAVENOUS
  Filled 2024-04-25 (×7): qty 4

## 2024-04-25 MED ORDER — IOHEXOL 300 MG/ML  SOLN
100.0000 mL | Freq: Once | INTRAMUSCULAR | Status: AC | PRN
  Administered 2024-04-25: 100 mL via INTRAVENOUS

## 2024-04-25 MED ORDER — ACETAMINOPHEN 325 MG PO TABS
650.0000 mg | ORAL_TABLET | Freq: Four times a day (QID) | ORAL | Status: DC | PRN
  Administered 2024-04-25 – 2024-04-26 (×2): 650 mg via ORAL
  Filled 2024-04-25 (×2): qty 2

## 2024-04-25 MED ORDER — PHENAZOPYRIDINE HCL 100 MG PO TABS: 100.0000 mg | ORAL_TABLET | Freq: Three times a day (TID) | ORAL | Status: DC | PRN

## 2024-04-25 MED ORDER — ONDANSETRON HCL 4 MG/2ML IJ SOLN
4.0000 mg | Freq: Once | INTRAMUSCULAR | Status: AC
  Administered 2024-04-25: 4 mg via INTRAVENOUS
  Filled 2024-04-25: qty 2

## 2024-04-25 MED ORDER — ONDANSETRON HCL 4 MG PO TABS: 4.0000 mg | ORAL_TABLET | Freq: Four times a day (QID) | ORAL | Status: DC | PRN

## 2024-04-25 MED ORDER — FENTANYL CITRATE (PF) 50 MCG/ML IJ SOSY
50.0000 ug | PREFILLED_SYRINGE | Freq: Once | INTRAMUSCULAR | Status: AC
  Administered 2024-04-25: 50 ug via INTRAVENOUS
  Filled 2024-04-25: qty 1

## 2024-04-25 MED ORDER — TRAZODONE HCL 50 MG PO TABS: 25.0000 mg | ORAL_TABLET | Freq: Every evening | ORAL | Status: DC | PRN

## 2024-04-25 NOTE — ED Notes (Signed)
 Pt provided with broth and juice after changed to clear liquids.

## 2024-04-25 NOTE — H&P (Signed)
 " History and Physical  OSCAR FORMAN FMW:[id number] DOB: April 29, 1985 DOA: 04/24/2024  PCP: Leonarda [name], NP   Chief Complaint: Weakness, abdominal pain  HPI: Holly Brown is a 39 y.o. female with medical history significant for cervical cancer status postsurgical resection on chemotherapy being admitted to the hospital with abdominal pain and fatigue concerning for UTI.  History is provided by the patient, who states that she last had chemotherapy this past Friday 1/2.  The day prior to that, she started having some diffuse abdominal pain worse on the left side, radiating across her abdomen.  There is some associated nausea without vomiting.  She had 1 fever last week.  She has not had any diarrhea, cough, congestion, shortness of breath.  She has some pressure with urination.  Review of Systems: Please see HPI for pertinent positives and negatives. A complete 10 system review of systems are otherwise negative.  Past Medical History:  Diagnosis Date   Anxiety    Cancer (HCC)    cervical cancer   Depression    Fatty liver    GERD (gastroesophageal reflux disease)    History of hiatal hernia    History of UTI    HSV (herpes simplex virus) infection    Migraines    Peripheral vascular disease    Post-operative nausea and vomiting 12/19/2022   Seizures (HCC)    Jan.2016 - only had one seizure, none since   Sleep apnea    no CPAP   Vaginal Pap smear, abnormal    Past Surgical History:  Procedure Laterality Date   BIOPSY  12/24/2022   Procedure: BIOPSY;  Surgeon: Leigh Elspeth SQUIBB, MD;  Location: [facility] ENDOSCOPY;  Service: Gastroenterology;;   BONE BIOPSY  12/12/2023   Procedure: BIOPSY, GI;  Surgeon: Federico Rosario BROCKS, MD;  Location: [facility] ENDOSCOPY;  Service: Gastroenterology;;   COLONOSCOPY N/A 12/12/2023   Procedure: COLONOSCOPY;  Surgeon: Federico Rosario BROCKS, MD;  Location: [facility] ENDOSCOPY;  Service: Gastroenterology;  Laterality: N/A;   CYSTOSCOPY WITH INDOCYANINE GREEN  IMAGING  (ICG) Bilateral 02/10/2024   Procedure: CYSTOSCOPY WITH INDOCYANINE GREEN  IMAGING (ICG);  Surgeon: Cam Morene ORN, MD;  Location: [facility] ORS;  Service: Urology;  Laterality: Bilateral;   DILATION AND CURETTAGE OF UTERUS     ESOPHAGOGASTRODUODENOSCOPY (EGD) WITH PROPOFOL  N/A 12/24/2022   Procedure: ESOPHAGOGASTRODUODENOSCOPY (EGD) WITH PROPOFOL ;  Surgeon: Leigh Elspeth SQUIBB, MD;  Location: [facility] ENDOSCOPY;  Service: Gastroenterology;  Laterality: N/A;   IR IMAGING GUIDED PORT INSERTION  03/25/2024   LAPAROSCOPIC TUBAL LIGATION Bilateral 12/08/2018   Procedure: LAPAROSCOPIC TUBAL LIGATION;  Surgeon: Alger Gong, MD;  Location: [facility];  Service: Gynecology;  Laterality: Bilateral;   LEEP N/A 03/18/2022   Procedure: LOOP ELECTROSURGICAL EXCISION PROCEDURE (LEEP);  Surgeon: Alger Gong, MD;  Location: [facility] OR;  Service: Gynecology;  Laterality: N/A;   MINI-LAPAROTOMY     after hysterectomy   POLYPECTOMY  12/12/2023   Procedure: POLYPECTOMY, INTESTINE;  Surgeon: Federico Rosario BROCKS, MD;  Location: [facility] ENDOSCOPY;  Service: Gastroenterology;;   TUBAL LIGATION     VAGINAL HYSTERECTOMY N/A 12/16/2022   Procedure: TOTAL VAGINAL HYSTERECTOMY, BILATERAL SALPINGECTOMY;  Surgeon: Lorence Ozell CROME, MD;  Location: [facility] OR;  Service: Gynecology;  Laterality: N/A;   VAGINECTOMY, PARTIAL N/A 02/10/2024   Procedure: UPPER VAGINECTOMY, LEFT PARAMETRECTOMY, LEFT URETEROLYSIS, OVARIAN TRANSPOSITION;  Surgeon: [name] Comer SAUNDERS, MD;  Location: [facility] ORS;  Service: Gynecology;  Laterality: N/A;  ROBOTIC  ASSISTED UPPER VAGINECTOMY   WISDOM TOOTH EXTRACTION  04/21/2009  Social History:  reports that she has quit smoking. Her smoking use included cigarettes. She started smoking about 12 years ago. She has a 3.1 pack-year smoking history. She has never been exposed to tobacco smoke. She has never used smokeless tobacco. She reports current alcohol use. She reports that she does not currently use drugs after  having used the following drugs: Marijuana.  Allergies[1]  Family History  Problem Relation Age of Onset   Diabetes Mother    Stroke Mother    Colon cancer Father 35   Cancer Father    Hypertension Maternal Grandmother    Diabetes Maternal Grandmother    Hypertension Paternal Grandmother    Esophageal cancer Neg Hx    Liver disease Neg Hx      Prior to Admission medications  Medication Sig Start Date End Date Taking? Authorizing Provider  acetaminophen  (TYLENOL ) 500 MG tablet Take 1,000 mg by mouth every 6 (six) hours as needed for moderate pain (pain score 4-6), fever or headache.   Yes [provider]  dexamethasone  (DECADRON ) 4 MG tablet Take 2 tablets by mouth daily for 2 days after chemo every week x 4 weeks 04/22/24  Yes Lonn Hicks, MD  diphenoxylate -atropine  (LOMOTIL ) 2.5-0.025 MG tablet Take 1 tablet by mouth 4 (four) times daily as needed for diarrhea or loose stools. Do not exceed 8 tablets per day 04/19/24  Yes Shannon Agent, MD  lidocaine -prilocaine  (EMLA ) cream Apply to affected area once 03/31/24  Yes Gorsuch, Ni, MD  omeprazole  (PRILOSEC OTC) 20 MG tablet Take 20 mg by mouth daily as needed.   Yes [provider]  phenazopyridine  (PYRIDIUM ) 100 MG tablet Take 1 tablet (100 mg total) by mouth 3 (three) times daily as needed for pain. 03/11/24  Yes [name] Comer SAUNDERS, MD  polyethylene glycol powder (GLYCOLAX /MIRALAX ) 17 GM/SCOOP powder Take 233.75 g by mouth once for 1 dose Take according to your procedure prep instructions. Patient taking differently: Take 238 g by mouth in the morning and at bedtime. 02/03/24  Yes   prochlorperazine  (COMPAZINE ) 10 MG tablet Take 1 tablet (10 mg total) by mouth every 6 (six) hours as needed for nausea or vomiting. 03/31/24  Yes Gorsuch, Ni, MD  ondansetron  (ZOFRAN ) 8 MG tablet Take 1 tablet (8 mg total) by mouth every 8 hours as needed for nausea or vomiting. Start on the third day after cisplatin . 03/31/24   Lonn Hicks,  MD  oxybutynin  (DITROPAN  XL) 10 MG 24 hr tablet Take 1 tablet (10 mg total) by mouth at bedtime. Patient not taking: Reported on 04/25/2024 02/21/24   Francis Ileana SAILOR, PA-C    Physical Exam: BP 116/75   Pulse (!) 51   Temp 98.5 F (36.9 C) (Oral)   Resp 14   LMP  (LMP Unknown) Comment: Depo Injections - last injection 10/2022, had spotting on and off since 10/2022 injection  SpO2 100%  General:  Alert, oriented, calm, in no acute distress, resting comfortably on room air.  She looks tired. Eyes: EOMI, clear conjuctivae, white sclerea Neck: supple, no masses, trachea mildline  Cardiovascular: RRR, no murmurs or rubs, no peripheral edema  Respiratory: clear to auscultation bilaterally, no wheezes, no crackles  Abdomen: soft, nontender, nondistended, normal bowel tones heard  Skin: dry, no rashes  Musculoskeletal: no joint effusions, normal range of motion  Psychiatric: appropriate affect, normal speech  Neurologic: extraocular muscles intact, clear speech, moving all extremities with intact sensorium         Labs on Admission:  Basic Metabolic Panel: Recent Labs  Lab 04/20/24 1357 04/24/24 2245  NA 138 137  K 3.8 3.6  CL 103 102  CO2 26 25  GLUCOSE 91 135*  BUN 14 14  CREATININE 0.87 0.85  CALCIUM  9.2 9.1  MG 1.9  --    Liver Function Tests: Recent Labs  Lab 04/24/24 2245  AST 15  ALT 25  ALKPHOS 63  BILITOT 0.4  PROT 6.4*  ALBUMIN 3.7   Recent Labs  Lab 04/24/24 2245  LIPASE 235*   No results for input(s): AMMONIA in the last 168 hours. CBC: Recent Labs  Lab 04/20/24 1357 04/24/24 2245  WBC 4.1 6.4  NEUTROABS 2.7 5.2  HGB 11.7* 11.0*  HCT 34.0* 33.0*  MCV 89.7 93.2  PLT 220 204   Cardiac Enzymes: No results for input(s): CKTOTAL, CKMB, CKMBINDEX, TROPONINI in the last 168 hours. BNP (last 3 results) Recent Labs    11/19/23 0857  BNP 20.9    ProBNP (last 3 results) No results for input(s): PROBNP in the last 8760 hours.  CBG: No  results for input(s): GLUCAP in the last 168 hours.  Radiological Exams on Admission: CT ABDOMEN PELVIS W CONTRAST Result Date: 04/25/2024 EXAM: CT ABDOMEN AND PELVIS WITH CONTRAST 04/25/2024 02:04:09 AM TECHNIQUE: CT of the abdomen and pelvis was performed with the administration of intravenous contrast. Multiplanar reformatted images are provided for review. Automated exposure control, iterative reconstruction, and/or weight-based adjustment of the mA/kV was utilized to reduce the radiation dose to as low as reasonably achievable. COMPARISON: PET CT from 02/08/2024, CT abdomen and pelvis with no contrast 02/21/2024. CLINICAL HISTORY: LLQ abdominal pain. Patient is undergoing radiation treatment for cervical cancer, last treatment 04/22/2024, with decreasing oral intake. FINDINGS: LOWER CHEST: No acute abnormality. LIVER: The liver is 19 cm in length with mild steatosis, including focal periligamentous fat in segment 4b. There is no mass enhancement. GALLBLADDER AND BILE DUCTS: Gallbladder is unremarkable. No biliary ductal dilatation. SPLEEN: No acute abnormality. PANCREAS: No acute abnormality. ADRENAL GLANDS: No acute abnormality. There is no adrenal mass. KIDNEYS, URETERS AND BLADDER: No stones in the kidneys or ureters. No hydronephrosis. No perinephric or periureteral stranding. There is no renal mass. The bladder is thickened, more than expected for simple nondistention. This could be due to XRT or bacterial cystitis. Correlate with urinalysis for potential significance. GI AND BOWEL: Stomach demonstrates no acute abnormality. There is no bowel obstruction or inflammation. The appendix is normal. There is moderate retained stool in the ascending and transverse colon. PERITONEUM AND RETROPERITONEUM: No ascites. No free air. No free fluid or localizing collections. VASCULATURE: Aorta is normal in caliber. LYMPH NODES: No lymphadenopathy. REPRODUCTIVE ORGANS: The uterus is absent. No adnexal mass is seen.  There are bilateral adnexal surgical clips. BONES AND SOFT TISSUES: Bilateral chronic nonerosive sacroiliitis is again shown. No worrisome focal bone lesions. Early spurring change lower LUMBAR facet joints. There's no concerning regional bone lesion. There is a small fat-containing umbilical hernia. IMPRESSION: 1. No acute CT  findings in the abdomen or pelvis. 2. Bladder wall thickening, possibly related to radiation therapy or bacterial cystitis; consider urinalysis. 3. No appreciable  metastatic disease. No local adenopathy . Electronically signed by: Francis Quam MD 04/25/2024 02:27 AM EST RP Workstation: [id number]   Assessment/Plan Holly Brown is a 39 y.o. female with medical history significant for cervical cancer status postsurgical resection on chemotherapy being admitted to the hospital with abdominal pain and fatigue concerning for UTI.   UTI-with fatigue,  dysuria, abnormal urinalysis.  Not meeting sepsis criteria. -Observation admission -Empiric IV Unasyn  -Follow-up blood cultures  Pancreatitis-concern due to mild elevation of lipase, abdominal pain and nausea. -N.p.o. -IV fluids -Pain and nausea medication as needed  Anemia-due to chemotherapy, monitor with daily labs  DVT prophylaxis: Lovenox      Code Status: Full Code  Consults called: Dr. Lonn added to inpatient treatment team  Admission status: Observation  Time spent: 42 minutes  Keniya Schlotterbeck CHRISTELLA Gail MD [facility] Pager [phone]  If 7PM-7AM, please contact night-coverage [email or web address] Password [facility]  04/25/2024, 7:38 AM      [1]  Allergies Allergen Reactions   Keflex  [Cephalexin ] Hives and Itching    Denies difficulty breathing   "

## 2024-04-25 NOTE — ED Notes (Signed)
 Pt changed into a gown due to blood from port on clothing, pt ambulated to bathroom. C/o HA, tylenol  provided.

## 2024-04-25 NOTE — Plan of Care (Signed)
  Problem: Clinical Measurements: Goal: Diagnostic test results will improve Outcome: Progressing   Problem: Activity: Goal: Risk for activity intolerance will decrease Outcome: Progressing   Problem: Nutrition: Goal: Adequate nutrition will be maintained Outcome: Progressing   Problem: Coping: Goal: Level of anxiety will decrease Outcome: Progressing   Problem: Pain Managment: Goal: General experience of comfort will improve and/or be controlled Outcome: Progressing

## 2024-04-26 ENCOUNTER — Ambulatory Visit
Admission: RE | Admit: 2024-04-26 | Discharge: 2024-04-26 | Attending: Radiation Oncology | Admitting: Radiation Oncology

## 2024-04-26 ENCOUNTER — Other Ambulatory Visit (HOSPITAL_COMMUNITY): Payer: Self-pay

## 2024-04-26 ENCOUNTER — Other Ambulatory Visit: Payer: Self-pay

## 2024-04-26 ENCOUNTER — Ambulatory Visit

## 2024-04-26 DIAGNOSIS — B9620 Unspecified Escherichia coli [E. coli] as the cause of diseases classified elsewhere: Secondary | ICD-10-CM | POA: Diagnosis not present

## 2024-04-26 DIAGNOSIS — Z90710 Acquired absence of both cervix and uterus: Secondary | ICD-10-CM

## 2024-04-26 DIAGNOSIS — E66811 Obesity, class 1: Secondary | ICD-10-CM | POA: Diagnosis not present

## 2024-04-26 DIAGNOSIS — R748 Abnormal levels of other serum enzymes: Secondary | ICD-10-CM

## 2024-04-26 DIAGNOSIS — C539 Malignant neoplasm of cervix uteri, unspecified: Secondary | ICD-10-CM | POA: Diagnosis not present

## 2024-04-26 DIAGNOSIS — N390 Urinary tract infection, site not specified: Principal | ICD-10-CM

## 2024-04-26 LAB — CBC
HCT: 31.7 % — ABNORMAL LOW (ref 36.0–46.0)
Hemoglobin: 10.5 g/dL — ABNORMAL LOW (ref 12.0–15.0)
MCH: 30.9 pg (ref 26.0–34.0)
MCHC: 33.1 g/dL (ref 30.0–36.0)
MCV: 93.2 fL (ref 80.0–100.0)
Platelets: 173 K/uL (ref 150–400)
RBC: 3.4 MIL/uL — ABNORMAL LOW (ref 3.87–5.11)
RDW: 13.2 % (ref 11.5–15.5)
WBC: 2.8 K/uL — ABNORMAL LOW (ref 4.0–10.5)
nRBC: 0 % (ref 0.0–0.2)

## 2024-04-26 LAB — RAD ONC ARIA SESSION SUMMARY
Course Elapsed Days: 22
Plan Fractions Treated to Date: 15
Plan Prescribed Dose Per Fraction: 1.8 Gy
Plan Total Fractions Prescribed: 25
Plan Total Prescribed Dose: 45 Gy
Reference Point Dosage Given to Date: 27 Gy
Reference Point Session Dosage Given: 1.8 Gy
Session Number: 15

## 2024-04-26 LAB — BASIC METABOLIC PANEL WITH GFR
Anion gap: 9 (ref 5–15)
BUN: 8 mg/dL (ref 6–20)
CO2: 28 mmol/L (ref 22–32)
Calcium: 9 mg/dL (ref 8.9–10.3)
Chloride: 100 mmol/L (ref 98–111)
Creatinine, Ser: 0.87 mg/dL (ref 0.44–1.00)
GFR, Estimated: >60 mL/min
Glucose, Bld: 82 mg/dL (ref 70–99)
Potassium: 3.8 mmol/L (ref 3.5–5.1)
Sodium: 136 mmol/L (ref 135–145)

## 2024-04-26 LAB — LIPASE, BLOOD: Lipase: 82 U/L — ABNORMAL HIGH (ref 11–51)

## 2024-04-26 MED ORDER — AMOXICILLIN-POT CLAVULANATE 875-125 MG PO TABS: 1.0000 | ORAL_TABLET | Freq: Two times a day (BID) | ORAL | Status: DC

## 2024-04-26 MED ORDER — HEPARIN SOD (PORK) LOCK FLUSH 100 UNIT/ML IV SOLN
500.0000 [IU] | Freq: Once | INTRAVENOUS | Status: AC
  Administered 2024-04-26: 500 [IU] via INTRAVENOUS
  Filled 2024-04-26: qty 5

## 2024-04-26 MED ORDER — AMOXICILLIN-POT CLAVULANATE 875-125 MG PO TABS
1.0000 | ORAL_TABLET | Freq: Two times a day (BID) | ORAL | 0 refills | Status: AC
  Filled 2024-04-26: qty 10, 5d supply, fill #0

## 2024-04-26 MED ORDER — AMOXICILLIN-POT CLAVULANATE 875-125 MG PO TABS
1.0000 | ORAL_TABLET | Freq: Two times a day (BID) | ORAL | Status: DC
  Administered 2024-04-26: 1 via ORAL
  Filled 2024-04-26: qty 1

## 2024-04-26 NOTE — Progress Notes (Signed)
 Discharge instructions reviewed with patient, no questions at this time. Port heparin  locked and deaccessed. Discharge medications to be delivered by discharge flow nurse to patient in cancer center. Scott, with cancer center/radiation transport, patient, and Ciera with meds to beds made aware of patients location for med delivery. Patient discharged from 6E and transported to cancer center for radiation.

## 2024-04-26 NOTE — Discharge Summary (Signed)
 " Physician Discharge Summary   Patient: Holly Brown MRN: [id number] DOB: [birth year]  Admit date:     04/24/2024  Discharge date: {dischdate:26783}  Discharge Physician: Concepcion Riser   PCP: Leonarda [name], NP   Recommendations at discharge:  {Tip this will not be part of the note when signed- Example include specific recommendations for outpatient follow-up, pending tests to follow-up on. (Optional):26781}  ***  Discharge Diagnoses: Active Problems:   UTI (urinary tract infection)  Resolved Problems:   * No resolved hospital problems. [facility] Course: No notes on file  Assessment and Plan: No notes have been filed under this hospital service. Service: Hospitalist     {Tip this will not be part of the note when signed Body mass index is 31.44 kg/m. , ,  (Optional):26781}  {(NOTE) Pain control PDMP Statment (Optional):26782} Consultants: *** Procedures performed: ***  Disposition: {Plan; Disposition:26390} Diet recommendation:  Discharge Diet Orders (From admission, onward)     Start     Ordered   04/26/24 0000  Diet - low sodium heart healthy        04/26/24 1019           {Diet_Plan:26776} DISCHARGE MEDICATION: Allergies as of 04/26/2024       Reactions   Keflex  [cephalexin ] Hives, Itching   Denies difficulty breathing        Medication List     TAKE these medications    acetaminophen  500 MG tablet Commonly known as: TYLENOL  Take 1,000 mg by mouth every 6 (six) hours as needed for moderate pain (pain score 4-6), fever or headache.   amoxicillin -clavulanate 875-125 MG tablet Commonly known as: AUGMENTIN  Take 1 tablet by mouth every 12 (twelve) hours for 5 days.   dexamethasone  4 MG tablet Commonly known as: DECADRON  Take 2 tablets by mouth daily for 2 days after chemo every week x 4 weeks   diphenoxylate -atropine  2.5-0.025 MG tablet Commonly known as: LOMOTIL  Take 1 tablet by mouth 4 (four) times daily as needed for diarrhea  or loose stools. Do not exceed 8 tablets per day   lidocaine -prilocaine  cream Commonly known as: EMLA  Apply to affected area once   omeprazole  20 MG tablet Commonly known as: PRILOSEC OTC Take 20 mg by mouth daily as needed.   ondansetron  8 MG tablet Commonly known as: ZOFRAN  Take 1 tablet (8 mg total) by mouth every 8 hours as needed for nausea or vomiting. Start on the third day after cisplatin .   oxybutynin  10 MG 24 hr tablet Commonly known as: Ditropan  XL Take 1 tablet (10 mg total) by mouth at bedtime.   phenazopyridine  100 MG tablet Commonly known as: Pyridium  Take 1 tablet (100 mg total) by mouth 3 (three) times daily as needed for pain.   polyethylene glycol powder 17 GM/SCOOP powder Commonly known as: GLYCOLAX /MIRALAX  Take 233.75 g by mouth once for 1 dose Take according to your procedure prep instructions. What changed:  how much to take how to take this when to take this   prochlorperazine  10 MG tablet Commonly known as: COMPAZINE  Take 1 tablet (10 mg total) by mouth every 6 (six) hours as needed for nausea or vomiting.        Discharge Exam: Filed Weights   04/25/24 1412  Weight: 99.4 kg   ***  Condition at discharge: {DC Condition:26389}  The results of significant diagnostics from this hospitalization (including imaging, microbiology, ancillary and laboratory) are listed below for reference.   Imaging Studies: CT ABDOMEN PELVIS W  CONTRAST Result Date: 04/25/2024 EXAM: CT ABDOMEN AND PELVIS WITH CONTRAST 04/25/2024 02:04:09 AM TECHNIQUE: CT of the abdomen and pelvis was performed with the administration of intravenous contrast. Multiplanar reformatted images are provided for review. Automated exposure control, iterative reconstruction, and/or weight-based adjustment of the mA/kV was utilized to reduce the radiation dose to as low as reasonably achievable. COMPARISON: PET CT from 02/08/2024, CT abdomen and pelvis with no contrast 02/21/2024. CLINICAL  HISTORY: LLQ abdominal pain. Patient is undergoing radiation treatment for cervical cancer, last treatment 04/22/2024, with decreasing oral intake. FINDINGS: LOWER CHEST: No acute abnormality. LIVER: The liver is 19 cm in length with mild steatosis, including focal periligamentous fat in segment 4b. There is no mass enhancement. GALLBLADDER AND BILE DUCTS: Gallbladder is unremarkable. No biliary ductal dilatation. SPLEEN: No acute abnormality. PANCREAS: No acute abnormality. ADRENAL GLANDS: No acute abnormality. There is no adrenal mass. KIDNEYS, URETERS AND BLADDER: No stones in the kidneys or ureters. No hydronephrosis. No perinephric or periureteral stranding. There is no renal mass. The bladder is thickened, more than expected for simple nondistention. This could be due to XRT or bacterial cystitis. Correlate with urinalysis for potential significance. GI AND BOWEL: Stomach demonstrates no acute abnormality. There is no bowel obstruction or inflammation. The appendix is normal. There is moderate retained stool in the ascending and transverse colon. PERITONEUM AND RETROPERITONEUM: No ascites. No free air. No free fluid or localizing collections. VASCULATURE: Aorta is normal in caliber. LYMPH NODES: No lymphadenopathy. REPRODUCTIVE ORGANS: The uterus is absent. No adnexal mass is seen. There are bilateral adnexal surgical clips. BONES AND SOFT TISSUES: Bilateral chronic nonerosive sacroiliitis is again shown. No worrisome focal bone lesions. Early spurring change lower LUMBAR facet joints. There's no concerning regional bone lesion. There is a small fat-containing umbilical hernia. IMPRESSION: 1. No acute CT  findings in the abdomen or pelvis. 2. Bladder wall thickening, possibly related to radiation therapy or bacterial cystitis; consider urinalysis. 3. No appreciable  metastatic disease. No local adenopathy . Electronically signed by: Francis Quam MD 04/25/2024 02:27 AM EST RP Workstation: [id number]     Microbiology: Results for orders placed or performed in visit on 03/31/24  Urine Culture     Status: Abnormal   Collection Time: 03/31/24  2:48 PM   Specimen: Urine, Clean Catch  Result Value Ref Range Status   Specimen Description   Final    URINE, CLEAN CATCH Performed at [facility], [address] [place], [place] [address]    Special Requests   Final    NONE Performed at [facility], [address] [place], [place] [address]    Culture >=100,000 COLONIES/mL ESCHERICHIA COLI (A)  Final   Report Status 04/02/2024 FINAL  Final   Organism ID, Bacteria ESCHERICHIA COLI (A)  Final      Susceptibility   Escherichia coli - MIC*    AMPICILLIN  <=2 SENSITIVE Sensitive     CEFAZOLIN  (URINE) Value in next row Sensitive      <=1 SENSITIVEThis is a modified FDA-approved test that has been validated and its performance characteristics determined by the reporting laboratory.  This laboratory is certified under the Clinical Laboratory Improvement Amendments CLIA as qualified to perform high complexity clinical laboratory testing.    CEFEPIME Value in next row Sensitive      <=1 SENSITIVEThis is a modified FDA-approved test that has been validated and its performance characteristics determined by the reporting laboratory.  This laboratory is certified under the Clinical  Laboratory Improvement Amendments CLIA as qualified to perform high complexity clinical laboratory testing.    ERTAPENEM  Value in next row Sensitive      <=1 SENSITIVEThis is a modified FDA-approved test that has been validated and its performance characteristics determined by the reporting laboratory.  This laboratory is certified under the Clinical Laboratory Improvement Amendments CLIA as qualified to perform high complexity clinical laboratory testing.    CEFTRIAXONE  Value in next row Sensitive      <=1 SENSITIVEThis is a modified FDA-approved test that has been validated  and its performance characteristics determined by the reporting laboratory.  This laboratory is certified under the Clinical Laboratory Improvement Amendments CLIA as qualified to perform high complexity clinical laboratory testing.    CIPROFLOXACIN  Value in next row Sensitive      <=1 SENSITIVEThis is a modified FDA-approved test that has been validated and its performance characteristics determined by the reporting laboratory.  This laboratory is certified under the Clinical Laboratory Improvement Amendments CLIA as qualified to perform high complexity clinical laboratory testing.    GENTAMICIN Value in next row Sensitive      <=1 SENSITIVEThis is a modified FDA-approved test that has been validated and its performance characteristics determined by the reporting laboratory.  This laboratory is certified under the Clinical Laboratory Improvement Amendments CLIA as qualified to perform high complexity clinical laboratory testing.    NITROFURANTOIN  Value in next row Sensitive      <=1 SENSITIVEThis is a modified FDA-approved test that has been validated and its performance characteristics determined by the reporting laboratory.  This laboratory is certified under the Clinical Laboratory Improvement Amendments CLIA as qualified to perform high complexity clinical laboratory testing.    TRIMETH /SULFA  Value in next row Sensitive      <=1 SENSITIVEThis is a modified FDA-approved test that has been validated and its performance characteristics determined by the reporting laboratory.  This laboratory is certified under the Clinical Laboratory Improvement Amendments CLIA as qualified to perform high complexity clinical laboratory testing.    AMPICILLIN /SULBACTAM Value in next row Sensitive      <=1 SENSITIVEThis is a modified FDA-approved test that has been validated and its performance characteristics determined by the reporting laboratory.  This laboratory is certified under the Clinical Laboratory Improvement  Amendments CLIA as qualified to perform high complexity clinical laboratory testing.    PIP/TAZO Value in next row Sensitive      <=4 SENSITIVEThis is a modified FDA-approved test that has been validated and its performance characteristics determined by the reporting laboratory.  This laboratory is certified under the Clinical Laboratory Improvement Amendments CLIA as qualified to perform high complexity clinical laboratory testing.    MEROPENEM Value in next row Sensitive      <=4 SENSITIVEThis is a modified FDA-approved test that has been validated and its performance characteristics determined by the reporting laboratory.  This laboratory is certified under the Clinical Laboratory Improvement Amendments CLIA as qualified to perform high complexity clinical laboratory testing.    * >=100,000 COLONIES/mL ESCHERICHIA COLI    Labs: CBC: Recent Labs  Lab 04/20/24 1357 04/24/24 2245 04/26/24 0558  WBC 4.1 6.4 2.8*  NEUTROABS 2.7 5.2  --   HGB 11.7* 11.0* 10.5*  HCT 34.0* 33.0* 31.7*  MCV 89.7 93.2 93.2  PLT 220 204 173   Basic Metabolic Panel: Recent Labs  Lab 04/20/24 1357 04/24/24 2245 04/26/24 0558  NA 138 137 136  K 3.8 3.6 3.8  CL 103 102 100  CO2 26 25 28  GLUCOSE 91 135* 82  BUN 14 14 8   CREATININE 0.87 0.85 0.87  CALCIUM  9.2 9.1 9.0  MG 1.9  --   --    Liver Function Tests: Recent Labs  Lab 04/24/24 2245  AST 15  ALT 25  ALKPHOS 63  BILITOT 0.4  PROT 6.4*  ALBUMIN 3.7   CBG: No results for input(s): GLUCAP in the last 168 hours.  Discharge time spent: {LESS THAN/GREATER UYJW:73611} 30 minutes.  Signed: Concepcion Riser, MD [facility] 04/26/2024 "

## 2024-04-26 NOTE — Progress Notes (Signed)
" °   04/26/24 0844  TOC Brief Assessment  Insurance and Status Reviewed  Patient has primary care physician Yes ([name], Dinah C, NP)  Home environment has been reviewed from home  Prior level of function: independent  Prior/Current Home Services No current home services  Social Drivers of Health Review SDOH reviewed no interventions necessary  Readmission risk has been reviewed Yes    "

## 2024-04-27 ENCOUNTER — Ambulatory Visit
Admission: RE | Admit: 2024-04-27 | Discharge: 2024-04-27 | Disposition: A | Discharge status: Home / Self Care | Source: Ambulatory Visit | Attending: Radiation Oncology | Admitting: Radiation Oncology

## 2024-04-27 ENCOUNTER — Other Ambulatory Visit: Payer: Self-pay

## 2024-04-27 DIAGNOSIS — D649 Anemia, unspecified: Secondary | ICD-10-CM | POA: Insufficient documentation

## 2024-04-27 DIAGNOSIS — E66811 Obesity, class 1: Secondary | ICD-10-CM | POA: Insufficient documentation

## 2024-04-27 DIAGNOSIS — R748 Abnormal levels of other serum enzymes: Secondary | ICD-10-CM | POA: Insufficient documentation

## 2024-04-27 LAB — URINE CULTURE: Culture: >=100000 — AB

## 2024-04-27 LAB — RAD ONC ARIA SESSION SUMMARY
Course Elapsed Days: 23
Plan Fractions Treated to Date: 16
Plan Prescribed Dose Per Fraction: 1.8 Gy
Plan Total Fractions Prescribed: 25
Plan Total Prescribed Dose: 45 Gy
Reference Point Dosage Given to Date: 28.8 Gy
Reference Point Session Dosage Given: 1.8 Gy
Session Number: 16

## 2024-04-28 ENCOUNTER — Other Ambulatory Visit: Payer: Self-pay

## 2024-04-28 ENCOUNTER — Inpatient Hospital Stay

## 2024-04-28 ENCOUNTER — Ambulatory Visit
Admission: RE | Admit: 2024-04-28 | Discharge: 2024-04-28 | Disposition: A | Discharge status: Home / Self Care | Source: Ambulatory Visit | Attending: Radiation Oncology | Admitting: Radiation Oncology

## 2024-04-28 ENCOUNTER — Telehealth: Payer: Self-pay

## 2024-04-28 DIAGNOSIS — C531 Malignant neoplasm of exocervix: Secondary | ICD-10-CM

## 2024-04-28 LAB — RAD ONC ARIA SESSION SUMMARY
Course Elapsed Days: 24
Plan Fractions Treated to Date: 17
Plan Prescribed Dose Per Fraction: 1.8 Gy
Plan Total Fractions Prescribed: 25
Plan Total Prescribed Dose: 45 Gy
Reference Point Dosage Given to Date: 30.6 Gy
Reference Point Session Dosage Given: 1.8 Gy
Session Number: 17

## 2024-04-28 LAB — CBC WITH DIFFERENTIAL (CANCER CENTER ONLY)
Abs Immature Granulocytes: 0.01 K/uL (ref 0.00–0.07)
Basophils Absolute: 0 K/uL (ref 0.0–0.1)
Basophils Relative: 0 %
Eosinophils Absolute: 0.1 K/uL (ref 0.0–0.5)
Eosinophils Relative: 3 %
HCT: 36.4 % (ref 36.0–46.0)
Hemoglobin: 12.8 g/dL (ref 12.0–15.0)
Immature Granulocytes: 0 %
Lymphocytes Relative: 16 %
Lymphs Abs: 0.6 K/uL — ABNORMAL LOW (ref 0.7–4.0)
MCH: 31.3 pg (ref 26.0–34.0)
MCHC: 35.2 g/dL (ref 30.0–36.0)
MCV: 89 fL (ref 80.0–100.0)
Monocytes Absolute: 0.3 K/uL (ref 0.1–1.0)
Monocytes Relative: 9 %
Neutro Abs: 2.8 K/uL (ref 1.7–7.7)
Neutrophils Relative %: 72 %
Platelet Count: 227 K/uL (ref 150–400)
RBC: 4.09 MIL/uL (ref 3.87–5.11)
RDW: 13.1 % (ref 11.5–15.5)
WBC Count: 3.8 K/uL — ABNORMAL LOW (ref 4.0–10.5)
nRBC: 0 % (ref 0.0–0.2)

## 2024-04-28 LAB — CMP (CANCER CENTER ONLY)
ALT: 25 U/L (ref 0–44)
AST: 21 U/L (ref 15–41)
Albumin: 4.2 g/dL (ref 3.5–5.0)
Alkaline Phosphatase: 71 U/L (ref 38–126)
Anion gap: 11 (ref 5–15)
BUN: 14 mg/dL (ref 6–20)
CO2: 24 mmol/L (ref 22–32)
Calcium: 9.2 mg/dL (ref 8.9–10.3)
Chloride: 100 mmol/L (ref 98–111)
Creatinine: 0.96 mg/dL (ref 0.44–1.00)
GFR, Estimated: >60 mL/min
Glucose, Bld: 125 mg/dL — ABNORMAL HIGH (ref 70–99)
Potassium: 3.8 mmol/L (ref 3.5–5.1)
Sodium: 136 mmol/L (ref 135–145)
Total Bilirubin: 0.4 mg/dL (ref 0.0–1.2)
Total Protein: 7.2 g/dL (ref 6.5–8.1)

## 2024-04-28 LAB — MAGNESIUM: Magnesium: 1.9 mg/dL (ref 1.7–2.4)

## 2024-04-28 NOTE — Telephone Encounter (Signed)
 Returned her call. Changed port flush appt today to lab appt per her request. She is complaining that port is uncomfortable. She will continue taking tylenol  and Dr. Lonn will evaluate tomorrow.

## 2024-04-29 ENCOUNTER — Other Ambulatory Visit: Payer: Self-pay

## 2024-04-29 ENCOUNTER — Ambulatory Visit
Admission: RE | Admit: 2024-04-29 | Discharge: 2024-04-29 | Disposition: A | Source: Ambulatory Visit | Attending: Radiation Oncology | Admitting: Radiation Oncology

## 2024-04-29 ENCOUNTER — Encounter: Payer: Self-pay | Admitting: Hematology and Oncology

## 2024-04-29 ENCOUNTER — Encounter: Payer: Self-pay | Admitting: Licensed Clinical Social Worker

## 2024-04-29 ENCOUNTER — Inpatient Hospital Stay

## 2024-04-29 ENCOUNTER — Inpatient Hospital Stay: Admitting: Hematology and Oncology

## 2024-04-29 VITALS — BP 131/81 | HR 95 | Temp 98.7°F | Resp 16 | Wt 212.0 lb

## 2024-04-29 DIAGNOSIS — C539 Malignant neoplasm of cervix uteri, unspecified: Secondary | ICD-10-CM

## 2024-04-29 DIAGNOSIS — D701 Agranulocytosis secondary to cancer chemotherapy: Secondary | ICD-10-CM | POA: Diagnosis not present

## 2024-04-29 DIAGNOSIS — R112 Nausea with vomiting, unspecified: Secondary | ICD-10-CM | POA: Diagnosis not present

## 2024-04-29 DIAGNOSIS — T451X5A Adverse effect of antineoplastic and immunosuppressive drugs, initial encounter: Secondary | ICD-10-CM

## 2024-04-29 DIAGNOSIS — C531 Malignant neoplasm of exocervix: Secondary | ICD-10-CM

## 2024-04-29 DIAGNOSIS — R519 Headache, unspecified: Secondary | ICD-10-CM

## 2024-04-29 LAB — RAD ONC ARIA SESSION SUMMARY
Course Elapsed Days: 25
Plan Fractions Treated to Date: 18
Plan Prescribed Dose Per Fraction: 1.8 Gy
Plan Total Fractions Prescribed: 25
Plan Total Prescribed Dose: 45 Gy
Reference Point Dosage Given to Date: 32.4 Gy
Reference Point Session Dosage Given: 1.8 Gy
Session Number: 18

## 2024-04-29 MED ORDER — SODIUM CHLORIDE 0.9 % IV SOLN: INTRAVENOUS | Status: DC

## 2024-04-29 MED ORDER — MAGNESIUM SULFATE 2 GM/50ML IV SOLN
2.0000 g | Freq: Once | INTRAVENOUS | Status: AC
  Administered 2024-04-29: 2 g via INTRAVENOUS
  Filled 2024-04-29: qty 50

## 2024-04-29 MED ORDER — SODIUM CHLORIDE 0.9 % IV SOLN
150.0000 mg | Freq: Once | INTRAVENOUS | Status: AC
  Administered 2024-04-29: 150 mg via INTRAVENOUS
  Filled 2024-04-29: qty 150

## 2024-04-29 MED ORDER — SODIUM CHLORIDE 0.9 % IV SOLN
200.0000 mg | Freq: Once | INTRAVENOUS | Status: AC
  Administered 2024-04-29: 200 mg via INTRAVENOUS
  Filled 2024-04-29: qty 200

## 2024-04-29 MED ORDER — PROCHLORPERAZINE EDISYLATE 10 MG/2ML IJ SOLN
10.0000 mg | Freq: Once | INTRAMUSCULAR | Status: AC
  Administered 2024-04-29: 10 mg via INTRAVENOUS
  Filled 2024-04-29: qty 2

## 2024-04-29 MED ORDER — ACETAMINOPHEN 325 MG PO TABS
650.0000 mg | ORAL_TABLET | Freq: Once | ORAL | Status: AC
  Administered 2024-04-29: 650 mg via ORAL
  Filled 2024-04-29: qty 2

## 2024-04-29 MED ORDER — PALONOSETRON HCL INJECTION 0.25 MG/5ML
0.2500 mg | Freq: Once | INTRAVENOUS | Status: AC
  Administered 2024-04-29: 0.25 mg via INTRAVENOUS
  Filled 2024-04-29: qty 5

## 2024-04-29 MED ORDER — SODIUM CHLORIDE 0.9 % IV SOLN
32.0000 mg/m2 | Freq: Once | INTRAVENOUS | Status: AC
  Administered 2024-04-29: 64 mg via INTRAVENOUS
  Filled 2024-04-29: qty 64

## 2024-04-29 MED ORDER — POTASSIUM CHLORIDE IN NACL 20-0.9 MEQ/L-% IV SOLN
Freq: Once | INTRAVENOUS | Status: AC
  Filled 2024-04-29: qty 1000

## 2024-04-29 MED ORDER — DEXAMETHASONE SOD PHOSPHATE PF 10 MG/ML IJ SOLN
10.0000 mg | Freq: Once | INTRAMUSCULAR | Status: AC
  Administered 2024-04-29: 10 mg via INTRAVENOUS
  Filled 2024-04-29: qty 1

## 2024-04-29 NOTE — Progress Notes (Signed)
 [facility] OFFICE PROGRESS NOTE  Patient Care Team: [name], [name], NP as PCP - General (Family Medicine) [name] Comer SAUNDERS, MD as Consulting Physician (Gynecologic Oncology) Devona Darice SAUNDERS, RN as Oncology Nurse Navigator (Oncology)  Assessment & Plan Malignant neoplasm of cervix, unspecified site [facility]) Disease status: Undetermined Evidence: Symptoms  The patient has abnormal Pap smear dated back to 2017.  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  Subsequent follow-up in October confirmed likelihood of recurrent disease.  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  There is present of lymphovascular invasion and perineural invasion  She is still recovering from surgery We discussed the role of adjuvant treatment Her previous PD-L1 test from 2024 was negative, repeat PD-L1 on her latest tissue from October 2025 was 40% positive  She tolerated treatment poorly with recurrent nausea & vomiting, recent hospitalization for infection We will proceed with minor dose reduction today and additional supportive care for her nausea Nausea and vomiting, unspecified vomiting type She will get additional antiemetics today She will continue dexamethasone  after treatment Leukopenia due to antineoplastic chemotherapy This is likely due to recent treatment. The patient denies recent history of fevers, cough, chills, diarrhea or dysuria. She is asymptomatic from the leukopenia. I will observe for now.  I will continue the chemotherapy at current dose without dosage adjustment.  If the leukopenia gets progressive worse in the future, I might have to  delay her treatment or adjust the chemotherapy dose.   No orders of the defined types were placed in this encounter.    [name], MD  INTERVAL HISTORY: she returns for treatment follow-up Complications related to previous cycle of chemotherapy included leukopenia,, nausea with/without vomiting,, recent hospitalization/ ER visit,, and infection, She was recently hospitalized and was found to have urinary tract infection She was given antibiotics She complained of nausea overnight She also complained of discomfort in her port area, this is evaluated and it appears normal  PHYSICAL EXAMINATION: ECOG PERFORMANCE STATUS: 1 - Symptomatic but completely ambulatory  No results found for: CAN125    Latest Ref Rng & Units 04/28/2024    3:00 PM 04/26/2024    5:58 AM 04/24/2024   10:45 PM  CBC  WBC 4.0 - 10.5 K/uL 3.8  2.8  6.4   Hemoglobin 12.0 - 15.0 g/dL 87.1  89.4  88.9   Hematocrit 36.0 - 46.0 % 36.4  31.7  33.0   Platelets 150 - 400 K/uL 227  173  204       Chemistry      Component Value Date/Time   NA 136 04/28/2024 1500   NA 139 10/10/2015 1623   K 3.8 04/28/2024 1500   CL 100 04/28/2024 1500   CO2 24 04/28/2024 1500   BUN 14 04/28/2024 1500   BUN 7 10/10/2015 1623   CREATININE 0.96 04/28/2024 1500   CREATININE 0.82 12/16/2023 1019      Component Value Date/Time   CALCIUM  9.2 04/28/2024 1500   ALKPHOS 71 04/28/2024 1500   AST 21 04/28/2024 1500   ALT 25 04/28/2024 1500   BILITOT 0.4 04/28/2024 1500       There were no vitals filed for this visit. There  were no vitals filed for this visit. Other relevant data reviewed during this visit included CBC, CMP, CT imaging, magnesium 

## 2024-04-29 NOTE — Patient Instructions (Signed)
 [facility] - A DEPT OF [facility]  Discharge Instructions: Thank you for choosing [facility] to provide your oncology and hematology care.   If you have a lab appointment with the Cancer Center, please go directly to the Cancer Center and check in at the registration area.   Wear comfortable clothing and clothing appropriate for easy access to any Portacath or PICC line.   We strive to give you quality time with your provider. You may need to reschedule your appointment if you arrive late (15 or more minutes).  Arriving late affects you and other patients whose appointments are after yours.  Also, if you miss three or more appointments without notifying the office, you may be dismissed from the clinic at the provider's discretion.      For prescription refill requests, have your pharmacy contact our office and allow 72 hours for refills to be completed.    Today you received the following chemotherapy and/or immunotherapy agents: CISplatin (PLATINOL)     To help prevent nausea and vomiting after your treatment, we encourage you to take your nausea medication as directed.  BELOW ARE SYMPTOMS THAT SHOULD BE REPORTED IMMEDIATELY: *FEVER GREATER THAN 100.4 F (38 C) OR HIGHER *CHILLS OR SWEATING *NAUSEA AND VOMITING THAT IS NOT CONTROLLED WITH YOUR NAUSEA MEDICATION *UNUSUAL SHORTNESS OF BREATH *UNUSUAL BRUISING OR BLEEDING *URINARY PROBLEMS (pain or burning when urinating, or frequent urination) *BOWEL PROBLEMS (unusual diarrhea, constipation, pain near the anus) TENDERNESS IN MOUTH AND THROAT WITH OR WITHOUT PRESENCE OF ULCERS (sore throat, sores in mouth, or a toothache) UNUSUAL RASH, SWELLING OR PAIN  UNUSUAL VAGINAL DISCHARGE OR ITCHING   Items with * indicate a potential emergency and should be followed up as soon as possible or go to the Emergency Department if any problems should occur.  Please show the CHEMOTHERAPY ALERT CARD or  IMMUNOTHERAPY ALERT CARD at check-in to the Emergency Department and triage nurse.  Should you have questions after your visit or need to cancel or reschedule your appointment, please contact [facility] - A DEPT OF [facility]  Dept: [phone]  and follow the prompts.  Office hours are 8:00 a.m. to 4:30 p.m. Monday - Friday. Please note that voicemails left after 4:00 p.m. may not be returned until the following business day.  We are closed weekends and major holidays. You have access to a nurse at all times for urgent questions. Please call the main number to the clinic Dept: [phone] and follow the prompts.   For any non-urgent questions, you may also contact your provider using MyChart. We now offer e-Visits for anyone 41 and older to request care online for non-urgent symptoms. For details visit [email or web address].   Also download the MyChart app! Go to the app store, search "MyChart", open the app, select [place], and log in with your MyChart username and password.

## 2024-04-29 NOTE — Assessment & Plan Note (Addendum)
 This is likely due to recent treatment. The patient denies recent history of fevers, cough, chills, diarrhea or dysuria. She is asymptomatic from the leukopenia. I will observe for now.  I will continue the chemotherapy at current dose without dosage adjustment.  If the leukopenia gets progressive worse in the future, I might have to delay her treatment or adjust the chemotherapy dose.

## 2024-04-29 NOTE — Assessment & Plan Note (Addendum)
 She will get additional antiemetics today She will continue dexamethasone  after treatment

## 2024-04-29 NOTE — Assessment & Plan Note (Addendum)
 Disease status: Undetermined Evidence: Symptoms  The patient has abnormal Pap smear dated back to 2017.  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  Subsequent follow-up in October confirmed likelihood of recurrent disease.  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  There is present of lymphovascular invasion and perineural invasion  She is still recovering from surgery We discussed the role of adjuvant treatment Her previous PD-L1 test from 2024 was negative, repeat PD-L1 on her latest tissue from October 2025 was 40% positive  She tolerated treatment poorly with recurrent nausea & vomiting, recent hospitalization for infection We will proceed with minor dose reduction today and additional supportive care for her nausea

## 2024-05-01 ENCOUNTER — Other Ambulatory Visit: Payer: Self-pay

## 2024-05-01 ENCOUNTER — Emergency Department (HOSPITAL_COMMUNITY)
Admission: EM | Admit: 2024-05-01 | Discharge: 2024-05-02 | Disposition: A | Attending: Emergency Medicine | Admitting: Emergency Medicine

## 2024-05-01 ENCOUNTER — Encounter (HOSPITAL_COMMUNITY): Payer: Self-pay

## 2024-05-01 DIAGNOSIS — N3090 Cystitis, unspecified without hematuria: Secondary | ICD-10-CM | POA: Diagnosis not present

## 2024-05-01 DIAGNOSIS — R300 Dysuria: Secondary | ICD-10-CM | POA: Diagnosis present

## 2024-05-01 DIAGNOSIS — C539 Malignant neoplasm of cervix uteri, unspecified: Secondary | ICD-10-CM | POA: Insufficient documentation

## 2024-05-01 NOTE — ED Notes (Signed)
 Pt. States that she does not want her port accessed. States that she wants to wait for blood work since she is a hard stick.

## 2024-05-01 NOTE — ED Triage Notes (Signed)
 Pt. Arrives for painful urination and burning in the vagina x1 day. Pt. Is currently undergoing chemo. Last treatment was Friday.

## 2024-05-02 ENCOUNTER — Other Ambulatory Visit (HOSPITAL_COMMUNITY): Payer: Self-pay

## 2024-05-02 ENCOUNTER — Ambulatory Visit
Admission: RE | Admit: 2024-05-02 | Discharge: 2024-05-02 | Disposition: A | Source: Ambulatory Visit | Attending: Radiation Oncology | Admitting: Radiation Oncology

## 2024-05-02 ENCOUNTER — Other Ambulatory Visit: Payer: Self-pay

## 2024-05-02 ENCOUNTER — Emergency Department (HOSPITAL_COMMUNITY)

## 2024-05-02 LAB — URINALYSIS, ROUTINE W REFLEX MICROSCOPIC
Bilirubin Urine: NEGATIVE
Glucose, UA: >=500 mg/dL — AB
Hgb urine dipstick: NEGATIVE
Ketones, ur: NEGATIVE mg/dL
Nitrite: POSITIVE — AB
Protein, ur: NEGATIVE mg/dL
Specific Gravity, Urine: 1.017 (ref 1.005–1.030)
pH: 5 (ref 5.0–8.0)

## 2024-05-02 LAB — CBC
HCT: 37.1 % (ref 36.0–46.0)
Hemoglobin: 12.5 g/dL (ref 12.0–15.0)
MCH: 31.5 pg (ref 26.0–34.0)
MCHC: 33.7 g/dL (ref 30.0–36.0)
MCV: 93.5 fL (ref 80.0–100.0)
Platelets: 288 K/uL (ref 150–400)
RBC: 3.97 MIL/uL (ref 3.87–5.11)
RDW: 13.9 % (ref 11.5–15.5)
WBC: 7.1 K/uL (ref 4.0–10.5)
nRBC: 0 % (ref 0.0–0.2)

## 2024-05-02 LAB — RAD ONC ARIA SESSION SUMMARY
Course Elapsed Days: 28
Plan Fractions Treated to Date: 19
Plan Prescribed Dose Per Fraction: 1.8 Gy
Plan Total Fractions Prescribed: 25
Plan Total Prescribed Dose: 45 Gy
Reference Point Dosage Given to Date: 34.2 Gy
Reference Point Session Dosage Given: 1.8 Gy
Session Number: 19

## 2024-05-02 LAB — COMPREHENSIVE METABOLIC PANEL WITH GFR
ALT: 33 U/L (ref 0–44)
AST: 18 U/L (ref 15–41)
Albumin: 4.2 g/dL (ref 3.5–5.0)
Alkaline Phosphatase: 71 U/L (ref 38–126)
Anion gap: 13 (ref 5–15)
BUN: 18 mg/dL (ref 6–20)
CO2: 20 mmol/L — ABNORMAL LOW (ref 22–32)
Calcium: 9.6 mg/dL (ref 8.9–10.3)
Chloride: 103 mmol/L (ref 98–111)
Creatinine, Ser: 0.88 mg/dL (ref 0.44–1.00)
GFR, Estimated: >60 mL/min
Glucose, Bld: 107 mg/dL — ABNORMAL HIGH (ref 70–99)
Potassium: 4.5 mmol/L (ref 3.5–5.1)
Sodium: 136 mmol/L (ref 135–145)
Total Bilirubin: 0.3 mg/dL (ref 0.0–1.2)
Total Protein: 7.5 g/dL (ref 6.5–8.1)

## 2024-05-02 LAB — HCG, QUANTITATIVE, PREGNANCY: hCG, Beta Chain, Quant, S: <1 m[IU]/mL

## 2024-05-02 MED ORDER — SULFAMETHOXAZOLE-TRIMETHOPRIM 800-160 MG PO TABS
1.0000 | ORAL_TABLET | Freq: Once | ORAL | Status: AC
  Administered 2024-05-02: 1 via ORAL
  Filled 2024-05-02: qty 1

## 2024-05-02 MED ORDER — SULFAMETHOXAZOLE-TRIMETHOPRIM 800-160 MG PO TABS
1.0000 | ORAL_TABLET | Freq: Two times a day (BID) | ORAL | 0 refills | Status: AC
  Filled 2024-05-02: qty 14, 7d supply, fill #0

## 2024-05-02 MED ORDER — OXYBUTYNIN CHLORIDE 5 MG PO TABS
10.0000 mg | ORAL_TABLET | ORAL | Status: AC
  Administered 2024-05-02: 10 mg via ORAL
  Filled 2024-05-02: qty 2

## 2024-05-02 MED ORDER — KETOROLAC TROMETHAMINE 60 MG/2ML IM SOLN
30.0000 mg | Freq: Once | INTRAMUSCULAR | Status: AC
  Administered 2024-05-02: 30 mg via INTRAMUSCULAR
  Filled 2024-05-02: qty 2

## 2024-05-02 NOTE — ED Provider Notes (Signed)
 " [place] EMERGENCY DEPARTMENT AT [facility] Provider Note   CSN: [id number] Arrival date & time: 05/01/24  2340     Patient presents with: Dysuria   Holly Brown is a 39 y.o. female.   The history is provided by the patient.  Dysuria Pain quality:  Burning Pain severity:  Moderate Timing:  Constant Chronicity:  Recurrent Relieved by:  Nothing Worsened by:  Nothing Ineffective treatments:  None tried Urinary symptoms: no hesitancy and no bladder incontinence   Associated symptoms: no fever and no flank pain   Risk factors: no hx of pyelonephritis and not pregnant   Patient on chemo therapy for cervical cancer and ongoing dysuria present with dysuria.  No fevers.       Prior to Admission medications  Medication Sig Start Date End Date Taking? Authorizing Provider  sulfamethoxazole -trimethoprim  (BACTRIM  DS) 800-160 MG tablet Take 1 tablet by mouth 2 (two) times daily for 7 days. 05/02/24 05/09/24 Yes Shayra Anton, MD  acetaminophen  (TYLENOL ) 500 MG tablet Take 1,000 mg by mouth every 6 (six) hours as needed for moderate pain (pain score 4-6), fever or headache.    [provider]  dexamethasone  (DECADRON ) 4 MG tablet Take 2 tablets by mouth daily for 2 days after chemo every week x 4 weeks 04/22/24   Lonn Hicks, MD  diphenoxylate -atropine  (LOMOTIL ) 2.5-0.025 MG tablet Take 1 tablet by mouth 4 (four) times daily as needed for diarrhea or loose stools. Do not exceed 8 tablets per day 04/19/24   Shannon Agent, MD  lidocaine -prilocaine  (EMLA ) cream Apply to affected area once 03/31/24   Lonn Hicks, MD  omeprazole  (PRILOSEC OTC) 20 MG tablet Take 20 mg by mouth daily as needed.    [provider]  ondansetron  (ZOFRAN ) 8 MG tablet Take 1 tablet (8 mg total) by mouth every 8 hours as needed for nausea or vomiting. Start on the third day after cisplatin . 03/31/24   Lonn Hicks, MD  oxybutynin  (DITROPAN  XL) 10 MG 24 hr tablet Take 1 tablet (10 mg total)  by mouth at bedtime. Patient not taking: Reported on 04/25/2024 02/21/24   Keith, Kayla N, PA-C  phenazopyridine  (PYRIDIUM ) 100 MG tablet Take 1 tablet (100 mg total) by mouth 3 (three) times daily as needed for pain. 03/11/24   Tucker, Katherine R, MD  polyethylene glycol powder (GLYCOLAX /MIRALAX ) 17 GM/SCOOP powder Take 233.75 g by mouth once for 1 dose Take according to your procedure prep instructions. Patient taking differently: Take 238 g by mouth in the morning and at bedtime. 02/03/24     prochlorperazine  (COMPAZINE ) 10 MG tablet Take 1 tablet (10 mg total) by mouth every 6 (six) hours as needed for nausea or vomiting. 03/31/24   Lonn Hicks, MD    Allergies: Keflex  [cephalexin ]    Review of Systems  Constitutional:  Negative for fever.  Respiratory:  Negative for wheezing and stridor.   Genitourinary:  Positive for dysuria. Negative for flank pain.  All other systems reviewed and are negative.   Updated Vital Signs BP 112/66   Pulse (!) 57   Temp 98.2 F (36.8 C) (Oral)   Resp 18   LMP  (LMP Unknown) Comment: Depo Injections - last injection 10/2022, had spotting on and off since 10/2022 injection  SpO2 100%   Physical Exam Vitals and nursing note reviewed.  Constitutional:      General: She is not in acute distress.    Appearance: Normal appearance. She is well-developed.  HENT:  Head: Normocephalic and atraumatic.     Nose: Nose normal.  Eyes:     Pupils: Pupils are equal, round, and reactive to light.  Cardiovascular:     Rate and Rhythm: Normal rate and regular rhythm.     Pulses: Normal pulses.     Heart sounds: Normal heart sounds.  Pulmonary:     Effort: Pulmonary effort is normal. No respiratory distress.     Breath sounds: Normal breath sounds.  Abdominal:     General: Bowel sounds are normal. There is no distension.     Palpations: Abdomen is soft.     Tenderness: There is no abdominal tenderness. There is no guarding or rebound.  Musculoskeletal:         General: Normal range of motion.     Cervical back: Normal range of motion and neck supple.  Skin:    General: Skin is dry.     Capillary Refill: Capillary refill takes less than 2 seconds.     Findings: No erythema or rash.  Neurological:     General: No focal deficit present.     Mental Status: She is alert.     Deep Tendon Reflexes: Reflexes normal.  Psychiatric:        Mood and Affect: Mood normal.     (all labs ordered are listed, but only abnormal results are displayed) Results for orders placed or performed during the hospital encounter of 05/01/24  Urinalysis, Routine w reflex microscopic -Urine, Clean Catch   Collection Time: 05/01/24  1:24 AM  Result Value Ref Range   Color, Urine YELLOW YELLOW   APPearance HAZY (A) CLEAR   Specific Gravity, Urine 1.017 1.005 - 1.030   pH 5.0 5.0 - 8.0   Glucose, UA >=500 (A) NEGATIVE mg/dL   Hgb urine dipstick NEGATIVE NEGATIVE   Bilirubin Urine NEGATIVE NEGATIVE   Ketones, ur NEGATIVE NEGATIVE mg/dL   Protein, ur NEGATIVE NEGATIVE mg/dL   Nitrite POSITIVE (A) NEGATIVE   Leukocytes,Ua MODERATE (A) NEGATIVE   RBC / HPF 0-5 0 - 5 RBC/hpf   WBC, UA 21-50 0 - 5 WBC/hpf   Bacteria, UA RARE (A) NONE SEEN   Squamous Epithelial / HPF 0-5 0 - 5 /HPF   Mucus PRESENT   CBC   Collection Time: 05/01/24  2:57 AM  Result Value Ref Range   WBC 7.1 4.0 - 10.5 K/uL   RBC 3.97 3.87 - 5.11 MIL/uL   Hemoglobin 12.5 12.0 - 15.0 g/dL   HCT 62.8 63.9 - 53.9 %   MCV 93.5 80.0 - 100.0 fL   MCH 31.5 26.0 - 34.0 pg   MCHC 33.7 30.0 - 36.0 g/dL   RDW 86.0 88.4 - 84.4 %   Platelets 288 150 - 400 K/uL   nRBC 0.0 0.0 - 0.2 %  Comprehensive metabolic panel   Collection Time: 05/01/24  2:57 AM  Result Value Ref Range   Sodium 136 135 - 145 mmol/L   Potassium 4.5 3.5 - 5.1 mmol/L   Chloride 103 98 - 111 mmol/L   CO2 20 (L) 22 - 32 mmol/L   Glucose, Bld 107 (H) 70 - 99 mg/dL   BUN 18 6 - 20 mg/dL   Creatinine, Ser 9.11 0.44 - 1.00 mg/dL   Calcium   9.6 8.9 - 10.3 mg/dL   Total Protein 7.5 6.5 - 8.1 g/dL   Albumin 4.2 3.5 - 5.0 g/dL   AST 18 15 - 41 U/L   ALT 33 0 - 44  U/L   Alkaline Phosphatase 71 38 - 126 U/L   Total Bilirubin 0.3 0.0 - 1.2 mg/dL   GFR, Estimated >39 >39 mL/min   Anion gap 13 5 - 15  hCG, quantitative, pregnancy   Collection Time: 05/02/24  1:40 AM  Result Value Ref Range   hCG, Beta Chain, Quant, S <1 <5 mIU/mL   CT Renal Stone Study Result Date: 05/02/2024 EXAM: CT ABDOMEN AND PELVIS WITHOUT CONTRAST 05/02/2024 03:51:34 AM TECHNIQUE: CT of the abdomen and pelvis was performed without the administration of intravenous contrast. Multiplanar reformatted images are provided for review. Automated exposure control, iterative reconstruction, and/or weight-based adjustment of the mA/kV was utilized to reduce the radiation dose to as low as reasonably achievable. COMPARISON: CT abdomen and pelvis 04/25/2024. CLINICAL HISTORY: Abdominal/flank pain, stone suspected. FINDINGS: LOWER CHEST: No acute abnormality. LIVER: The liver is unremarkable. GALLBLADDER AND BILE DUCTS: Gallbladder is contracted. No biliary ductal dilatation. SPLEEN: No acute abnormality. PANCREAS: No acute abnormality. ADRENAL GLANDS: No acute abnormality. KIDNEYS, URETERS AND BLADDER: No stones in the kidneys or ureters. No hydronephrosis. No perinephric or periureteral stranding. Urinary bladder is unremarkable. GI AND BOWEL: Stomach demonstrates no acute abnormality. No small or large bowel thickening or dilatation. The appendix is unremarkable. PERITONEUM AND RETROPERITONEUM: No ascites. No free air. VASCULATURE: Aorta is normal in caliber. LYMPH NODES: No lymphadenopathy. REPRODUCTIVE ORGANS: Bilateral tubal ligation. Status post hysterectomy. No adnexal mass. The ovaries are grossly unremarkable. BONES AND SOFT TISSUES: No acute osseous abnormality. Diastasis recti. No focal soft tissue abnormality. IMPRESSION: 1. No acute findings in the abdomen or pelvis with  limited evaluation of this noncontrast study . Electronically signed by: Morgane Naveau MD MD 05/02/2024 03:58 AM EST RP Workstation: [id number]   CT ABDOMEN PELVIS W CONTRAST Result Date: 04/25/2024 EXAM: CT ABDOMEN AND PELVIS WITH CONTRAST 04/25/2024 02:04:09 AM TECHNIQUE: CT of the abdomen and pelvis was performed with the administration of intravenous contrast. Multiplanar reformatted images are provided for review. Automated exposure control, iterative reconstruction, and/or weight-based adjustment of the mA/kV was utilized to reduce the radiation dose to as low as reasonably achievable. COMPARISON: PET CT from 02/08/2024, CT abdomen and pelvis with no contrast 02/21/2024. CLINICAL HISTORY: LLQ abdominal pain. Patient is undergoing radiation treatment for cervical cancer, last treatment 04/22/2024, with decreasing oral intake. FINDINGS: LOWER CHEST: No acute abnormality. LIVER: The liver is 19 cm in length with mild steatosis, including focal periligamentous fat in segment 4b. There is no mass enhancement. GALLBLADDER AND BILE DUCTS: Gallbladder is unremarkable. No biliary ductal dilatation. SPLEEN: No acute abnormality. PANCREAS: No acute abnormality. ADRENAL GLANDS: No acute abnormality. There is no adrenal mass. KIDNEYS, URETERS AND BLADDER: No stones in the kidneys or ureters. No hydronephrosis. No perinephric or periureteral stranding. There is no renal mass. The bladder is thickened, more than expected for simple nondistention. This could be due to XRT or bacterial cystitis. Correlate with urinalysis for potential significance. GI AND BOWEL: Stomach demonstrates no acute abnormality. There is no bowel obstruction or inflammation. The appendix is normal. There is moderate retained stool in the ascending and transverse colon. PERITONEUM AND RETROPERITONEUM: No ascites. No free air. No free fluid or localizing collections. VASCULATURE: Aorta is normal in caliber. LYMPH NODES: No lymphadenopathy.  REPRODUCTIVE ORGANS: The uterus is absent. No adnexal mass is seen. There are bilateral adnexal surgical clips. BONES AND SOFT TISSUES: Bilateral chronic nonerosive sacroiliitis is again shown. No worrisome focal bone lesions. Early spurring change lower LUMBAR facet joints. There's no concerning  regional bone lesion. There is a small fat-containing umbilical hernia. IMPRESSION: 1. No acute CT  findings in the abdomen or pelvis. 2. Bladder wall thickening, possibly related to radiation therapy or bacterial cystitis; consider urinalysis. 3. No appreciable  metastatic disease. No local adenopathy . Electronically signed by: Francis Quam MD 04/25/2024 02:27 AM EST RP Workstation: [id number]      EKG: None  Radiology: CT Renal Stone Study Result Date: 05/02/2024 EXAM: CT ABDOMEN AND PELVIS WITHOUT CONTRAST 05/02/2024 03:51:34 AM TECHNIQUE: CT of the abdomen and pelvis was performed without the administration of intravenous contrast. Multiplanar reformatted images are provided for review. Automated exposure control, iterative reconstruction, and/or weight-based adjustment of the mA/kV was utilized to reduce the radiation dose to as low as reasonably achievable. COMPARISON: CT abdomen and pelvis 04/25/2024. CLINICAL HISTORY: Abdominal/flank pain, stone suspected. FINDINGS: LOWER CHEST: No acute abnormality. LIVER: The liver is unremarkable. GALLBLADDER AND BILE DUCTS: Gallbladder is contracted. No biliary ductal dilatation. SPLEEN: No acute abnormality. PANCREAS: No acute abnormality. ADRENAL GLANDS: No acute abnormality. KIDNEYS, URETERS AND BLADDER: No stones in the kidneys or ureters. No hydronephrosis. No perinephric or periureteral stranding. Urinary bladder is unremarkable. GI AND BOWEL: Stomach demonstrates no acute abnormality. No small or large bowel thickening or dilatation. The appendix is unremarkable. PERITONEUM AND RETROPERITONEUM: No ascites. No free air. VASCULATURE: Aorta is normal in caliber.  LYMPH NODES: No lymphadenopathy. REPRODUCTIVE ORGANS: Bilateral tubal ligation. Status post hysterectomy. No adnexal mass. The ovaries are grossly unremarkable. BONES AND SOFT TISSUES: No acute osseous abnormality. Diastasis recti. No focal soft tissue abnormality. IMPRESSION: 1. No acute findings in the abdomen or pelvis with limited evaluation of this noncontrast study . Electronically signed by: Morgane Naveau MD MD 05/02/2024 03:58 AM EST RP Workstation: [id number]     Procedures   Medications Ordered in the ED  sulfamethoxazole -trimethoprim  (BACTRIM  DS) 800-160 MG per tablet 1 tablet (1 tablet Oral Given 05/02/24 0323)  oxybutynin  (DITROPAN ) tablet 10 mg (10 mg Oral Given 05/02/24 0323)  ketorolac  (TORADOL ) injection 30 mg (30 mg Intramuscular Given 05/02/24 0428)                                    Medical Decision Making Patient with dysuria on chemotherapy for cervical CA  Amount and/or Complexity of Data Reviewed External Data Reviewed: labs and notes.    Details: Previous urine also had UTI Labs: ordered.    Details: Urine is consistent with UTI, pregnancy is negative normal sodium 136, normal potassium 4.5, normal creatinine. Normal white count 7.1, normal hemoglobin 12.5  Radiology: ordered and independent interpretation performed.    Details: Negative CT  Risk Prescription drug management. Risk Details: Well appearing.  Exam and vitals are benign and reassuring.  No signs of sepsis.  Will start bactrim  and have patient follow up with oncology for ongoing care.  There are no admission criteria at this time.  Stable for discharge.      Final diagnoses:  Cystitis   No signs of systemic illness or infection. The patient is nontoxic-appearing on exam and vital signs are within normal limits.  I have reviewed the triage vital signs and the nursing notes. Pertinent labs & imaging results that were available during my care of the patient were reviewed by me and considered in my  medical decision making (see chart for details). After history, exam, and medical workup I feel the patient has been appropriately medically  screened and is safe for discharge home. Pertinent diagnoses were discussed with the patient. Patient was given return precautions.    ED Discharge Orders          Ordered    sulfamethoxazole -trimethoprim  (BACTRIM  DS) 800-160 MG tablet  2 times daily        05/02/24 0448               Jayquan Bradsher, MD 05/02/24 0458  "

## 2024-05-03 ENCOUNTER — Inpatient Hospital Stay: Admitting: Nutrition

## 2024-05-03 ENCOUNTER — Ambulatory Visit
Admission: RE | Admit: 2024-05-03 | Discharge: 2024-05-03 | Attending: Radiation Oncology | Admitting: Radiation Oncology

## 2024-05-03 ENCOUNTER — Ambulatory Visit
Admission: RE | Admit: 2024-05-03 | Discharge: 2024-05-03 | Disposition: A | Source: Ambulatory Visit | Attending: Radiation Oncology | Admitting: Radiation Oncology

## 2024-05-03 ENCOUNTER — Other Ambulatory Visit: Payer: Self-pay

## 2024-05-03 ENCOUNTER — Other Ambulatory Visit: Payer: Self-pay | Admitting: Radiation Oncology

## 2024-05-03 DIAGNOSIS — R300 Dysuria: Secondary | ICD-10-CM

## 2024-05-03 DIAGNOSIS — C539 Malignant neoplasm of cervix uteri, unspecified: Secondary | ICD-10-CM

## 2024-05-03 LAB — RAD ONC ARIA SESSION SUMMARY
Course Elapsed Days: 29
Plan Fractions Treated to Date: 20
Plan Prescribed Dose Per Fraction: 1.8 Gy
Plan Total Fractions Prescribed: 25
Plan Total Prescribed Dose: 45 Gy
Reference Point Dosage Given to Date: 36 Gy
Reference Point Session Dosage Given: 1.8 Gy
Session Number: 20

## 2024-05-03 NOTE — Progress Notes (Signed)
 39 year old female diagnosed with cervical cancer receiving concurrent chemoradiation therapy with weekly cisplatin .  Patient also receives pembrolizumab .  Final radiation therapy scheduled for January 20.  Past medical history includes sleep apnea, PVD, GERD, depression, and anxiety.  Medications include Decadron , Lomotil , Prilosec, Zofran , MiraLAX , and Compazine .  Labs in January 11 include glucose 107.  Height: 5 feet 10 inches. Weight: 212 pounds January 9 Usual body weight: 220 pounds per patient BMI: 30.42 ECOG: 1  Patient reports her appetite varies day-to-day.  She occasionally will just have broth to drink if she does not feel like eating the meal.  Complains of early satiety. She has had intermittent nausea and vomiting with chemotherapy.  She suspects she has lactose intolerance.  Wonders which ONS are appropriate for someone with lactose intolerance. Reports occasional taste alterations for a couple days.  Her taste then returns back to normal.  Reports Lomotil  that she is using for diarrhea gives her very bad headaches.  States Imodium was not very effective.  Nutrition diagnosis: Food and nutrition related knowledge deficit related to cervical cancer and associated treatments as evidenced by no prior need for nutrition related information.  Intervention: Educated on importance of weight maintenance throughout treatment. Recommended smaller more frequent meals and snacks with adequate calories and protein. Tips provided on improving nausea. Strategies and information given on lactose intolerant. Discussed headaches with Lomotil  with MD for potentially alternative medication.  Educated patient on low fiber diet. Nutrition facts sheets provided.  Samples given.  Contact information given.  Monitoring, evaluation, goals: Patient will tolerate adequate calories and protein to minimize weight loss throughout treatment.  Next visit: Patient will contact RD with questions or  concerns.  **Disclaimer: This note was dictated with voice recognition software. Similar sounding words can inadvertently be transcribed and this note may contain transcription errors which may not have been corrected upon publication of note.**

## 2024-05-04 ENCOUNTER — Other Ambulatory Visit: Payer: Self-pay | Admitting: Radiation Oncology

## 2024-05-04 ENCOUNTER — Other Ambulatory Visit: Payer: Self-pay

## 2024-05-04 ENCOUNTER — Other Ambulatory Visit (HOSPITAL_BASED_OUTPATIENT_CLINIC_OR_DEPARTMENT_OTHER): Payer: Self-pay

## 2024-05-04 ENCOUNTER — Ambulatory Visit
Admission: RE | Admit: 2024-05-04 | Discharge: 2024-05-04 | Disposition: A | Discharge status: Home / Self Care | Source: Ambulatory Visit | Attending: Radiation Oncology | Admitting: Radiation Oncology

## 2024-05-04 ENCOUNTER — Inpatient Hospital Stay

## 2024-05-04 DIAGNOSIS — C539 Malignant neoplasm of cervix uteri, unspecified: Secondary | ICD-10-CM

## 2024-05-04 LAB — RAD ONC ARIA SESSION SUMMARY
Course Elapsed Days: 30
Plan Fractions Treated to Date: 21
Plan Prescribed Dose Per Fraction: 1.8 Gy
Plan Total Fractions Prescribed: 25
Plan Total Prescribed Dose: 45 Gy
Reference Point Dosage Given to Date: 37.8 Gy
Reference Point Session Dosage Given: 1.8 Gy
Session Number: 21

## 2024-05-04 LAB — URINALYSIS, COMPLETE (UACMP) WITH MICROSCOPIC
Bilirubin Urine: NEGATIVE
Glucose, UA: 50 mg/dL — AB
Hgb urine dipstick: NEGATIVE
Ketones, ur: NEGATIVE mg/dL
Nitrite: NEGATIVE
Protein, ur: NEGATIVE mg/dL
Specific Gravity, Urine: 1.018 (ref 1.005–1.030)
pH: 6 (ref 5.0–8.0)

## 2024-05-04 MED ORDER — NITROFURANTOIN MONOHYD MACRO 100 MG PO CAPS
100.0000 mg | ORAL_CAPSULE | Freq: Two times a day (BID) | ORAL | 0 refills | Status: DC
  Filled 2024-05-04 – 2024-05-05 (×2): qty 14, 7d supply, fill #0

## 2024-05-04 MED ORDER — SODIUM CHLORIDE 0.9 % IV SOLN: INTRAVENOUS | Status: AC

## 2024-05-04 NOTE — Patient Instructions (Signed)

## 2024-05-05 ENCOUNTER — Other Ambulatory Visit: Payer: Self-pay

## 2024-05-05 ENCOUNTER — Inpatient Hospital Stay

## 2024-05-05 ENCOUNTER — Other Ambulatory Visit (HOSPITAL_COMMUNITY): Payer: Self-pay

## 2024-05-05 ENCOUNTER — Ambulatory Visit

## 2024-05-05 DIAGNOSIS — C531 Malignant neoplasm of exocervix: Secondary | ICD-10-CM

## 2024-05-05 LAB — CBC WITH DIFFERENTIAL (CANCER CENTER ONLY)
Abs Immature Granulocytes: 0.03 K/uL (ref 0.00–0.07)
Basophils Absolute: 0 K/uL (ref 0.0–0.1)
Basophils Relative: 1 %
Eosinophils Absolute: 0.1 K/uL (ref 0.0–0.5)
Eosinophils Relative: 1 %
HCT: 33.5 % — ABNORMAL LOW (ref 36.0–46.0)
Hemoglobin: 11.8 g/dL — ABNORMAL LOW (ref 12.0–15.0)
Immature Granulocytes: 1 %
Lymphocytes Relative: 11 %
Lymphs Abs: 0.6 K/uL — ABNORMAL LOW (ref 0.7–4.0)
MCH: 31.4 pg (ref 26.0–34.0)
MCHC: 35.2 g/dL (ref 30.0–36.0)
MCV: 89.1 fL (ref 80.0–100.0)
Monocytes Absolute: 0.4 K/uL (ref 0.1–1.0)
Monocytes Relative: 8 %
Neutro Abs: 4.5 K/uL (ref 1.7–7.7)
Neutrophils Relative %: 78 %
Platelet Count: 312 K/uL (ref 150–400)
RBC: 3.76 MIL/uL — ABNORMAL LOW (ref 3.87–5.11)
RDW: 13.4 % (ref 11.5–15.5)
WBC Count: 5.6 K/uL (ref 4.0–10.5)
nRBC: 0 % (ref 0.0–0.2)

## 2024-05-05 LAB — RAD ONC ARIA SESSION SUMMARY
Course Elapsed Days: 31
Plan Fractions Treated to Date: 22
Plan Prescribed Dose Per Fraction: 1.8 Gy
Plan Total Fractions Prescribed: 25
Plan Total Prescribed Dose: 45 Gy
Reference Point Dosage Given to Date: 39.6 Gy
Reference Point Session Dosage Given: 1.8 Gy
Session Number: 22

## 2024-05-05 LAB — BASIC METABOLIC PANEL - CANCER CENTER ONLY
Anion gap: 11 (ref 5–15)
BUN: 14 mg/dL (ref 6–20)
CO2: 27 mmol/L (ref 22–32)
Calcium: 9.6 mg/dL (ref 8.9–10.3)
Chloride: 101 mmol/L (ref 98–111)
Creatinine: 0.93 mg/dL (ref 0.44–1.00)
GFR, Estimated: >60 mL/min
Glucose, Bld: 130 mg/dL — ABNORMAL HIGH (ref 70–99)
Potassium: 3.7 mmol/L (ref 3.5–5.1)
Sodium: 138 mmol/L (ref 135–145)

## 2024-05-05 LAB — URINE CULTURE: Culture: 50000 — AB

## 2024-05-05 LAB — MAGNESIUM: Magnesium: 1.7 mg/dL (ref 1.7–2.4)

## 2024-05-05 MED FILL — Fosaprepitant Dimeglumine For IV Infusion 150 MG (Base Eq): INTRAVENOUS | Qty: 5 | Status: AC

## 2024-05-06 ENCOUNTER — Encounter: Payer: Self-pay | Admitting: Hematology and Oncology

## 2024-05-06 ENCOUNTER — Other Ambulatory Visit: Payer: Self-pay

## 2024-05-06 ENCOUNTER — Inpatient Hospital Stay: Admitting: Hematology and Oncology

## 2024-05-06 ENCOUNTER — Inpatient Hospital Stay

## 2024-05-06 ENCOUNTER — Ambulatory Visit
Admission: RE | Admit: 2024-05-06 | Discharge: 2024-05-06 | Disposition: A | Discharge status: Home / Self Care | Source: Ambulatory Visit | Attending: Radiation Oncology | Admitting: Radiation Oncology

## 2024-05-06 VITALS — BP 111/74 | HR 94 | Temp 98.5°F | Resp 16 | Wt 215.2 lb

## 2024-05-06 DIAGNOSIS — T451X5A Adverse effect of antineoplastic and immunosuppressive drugs, initial encounter: Secondary | ICD-10-CM | POA: Diagnosis not present

## 2024-05-06 DIAGNOSIS — C539 Malignant neoplasm of cervix uteri, unspecified: Secondary | ICD-10-CM

## 2024-05-06 DIAGNOSIS — D6481 Anemia due to antineoplastic chemotherapy: Secondary | ICD-10-CM | POA: Diagnosis not present

## 2024-05-06 DIAGNOSIS — C531 Malignant neoplasm of exocervix: Secondary | ICD-10-CM

## 2024-05-06 LAB — RAD ONC ARIA SESSION SUMMARY
Course Elapsed Days: 32
Plan Fractions Treated to Date: 23
Plan Prescribed Dose Per Fraction: 1.8 Gy
Plan Total Fractions Prescribed: 25
Plan Total Prescribed Dose: 45 Gy
Reference Point Dosage Given to Date: 41.4 Gy
Reference Point Session Dosage Given: 1.8 Gy
Session Number: 23

## 2024-05-06 MED ORDER — DEXAMETHASONE SOD PHOSPHATE PF 10 MG/ML IJ SOLN
10.0000 mg | Freq: Once | INTRAMUSCULAR | Status: AC
  Administered 2024-05-06: 10 mg via INTRAVENOUS
  Filled 2024-05-06: qty 1

## 2024-05-06 MED ORDER — POTASSIUM CHLORIDE IN NACL 20-0.9 MEQ/L-% IV SOLN
Freq: Once | INTRAVENOUS | Status: AC
  Filled 2024-05-06: qty 1000

## 2024-05-06 MED ORDER — SODIUM CHLORIDE 0.9 % IV SOLN
150.0000 mg | Freq: Once | INTRAVENOUS | Status: AC
  Administered 2024-05-06: 150 mg via INTRAVENOUS
  Filled 2024-05-06: qty 150

## 2024-05-06 MED ORDER — PALONOSETRON HCL INJECTION 0.25 MG/5ML
0.2500 mg | Freq: Once | INTRAVENOUS | Status: AC
  Administered 2024-05-06: 0.25 mg via INTRAVENOUS
  Filled 2024-05-06: qty 5

## 2024-05-06 MED ORDER — SODIUM CHLORIDE 0.9 % IV SOLN: INTRAVENOUS | Status: DC

## 2024-05-06 MED ORDER — MAGNESIUM SULFATE 2 GM/50ML IV SOLN
2.0000 g | Freq: Once | INTRAVENOUS | Status: AC
  Administered 2024-05-06: 2 g via INTRAVENOUS
  Filled 2024-05-06: qty 50

## 2024-05-06 MED ORDER — SODIUM CHLORIDE 0.9 % IV SOLN
32.0000 mg/m2 | Freq: Once | INTRAVENOUS | Status: AC
  Administered 2024-05-06: 64 mg via INTRAVENOUS
  Filled 2024-05-06: qty 64

## 2024-05-06 NOTE — Progress Notes (Signed)
 [facility] OFFICE PROGRESS NOTE  Patient Care Team: [name], [name], NP as PCP - General (Family Medicine) [name] Comer SAUNDERS, MD as Consulting Physician (Gynecologic Oncology) Devona Darice SAUNDERS, RN as Oncology Nurse Navigator (Oncology)  Assessment & Plan Malignant neoplasm of cervix, unspecified site [facility]) Disease status: Undetermined Evidence: Symptoms  The patient has abnormal Pap smear dated back to 2017.  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  Subsequent follow-up in October confirmed likelihood of recurrent disease.  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  There is present of lymphovascular invasion and perineural invasion  Her previous PD-L1 test from 2024 was negative, repeat PD-L1 on her latest tissue from October 2025 was 40% positive  She tolerated treatment poorly with recurrent nausea & vomiting, recent hospitalization for infection Her dose from last week was reduced and she tolerated better Will proceed with treatment today without delay Anemia due to antineoplastic chemotherapy This is likely due to recent treatment. The patient denies recent history of bleeding such as epistaxis, hematuria or hematochezia. She is asymptomatic from the anemia. I will observe for now.  She does not require transfusion now. I will continue the chemotherapy at current dose without dosage adjustment.  If the anemia gets progressive worse in the future, I might have to delay her treatment or adjust the chemotherapy dose.   No orders of the defined types were placed in this encounter.    [name], MD  INTERVAL HISTORY: she  returns for treatment follow-up Complications related to previous cycle of chemotherapy included anemia, and mild intermittent tinnitus She complained of discomfort near her port but better Denies recent nausea vomiting Tinnitus is stable No hearing deficit no neuropathy  PHYSICAL EXAMINATION: ECOG PERFORMANCE STATUS: 1 - Symptomatic but completely ambulatory  No results found for: CAN125    Latest Ref Rng & Units 05/05/2024    3:23 PM 05/01/2024    2:57 AM 04/28/2024    3:00 PM  CBC  WBC 4.0 - 10.5 K/uL 5.6  7.1  3.8   Hemoglobin 12.0 - 15.0 g/dL 88.1  87.4  87.1   Hematocrit 36.0 - 46.0 % 33.5  37.1  36.4   Platelets 150 - 400 K/uL 312  288  227       Chemistry      Component Value Date/Time   NA 138 05/05/2024 1523   NA 139 10/10/2015 1623   K 3.7 05/05/2024 1523   CL 101 05/05/2024 1523   CO2 27 05/05/2024 1523   BUN 14 05/05/2024 1523   BUN 7 10/10/2015 1623   CREATININE 0.93 05/05/2024 1523   CREATININE 0.82 12/16/2023 1019      Component Value Date/Time   CALCIUM  9.6 05/05/2024 1523   ALKPHOS 71 05/01/2024 0257   AST 18 05/01/2024 0257   AST 21 04/28/2024 1500   ALT 33 05/01/2024 0257   ALT 25 04/28/2024 1500   BILITOT 0.3 05/01/2024 0257   BILITOT 0.4 04/28/2024 1500       There were no vitals filed for this visit. There were no vitals filed for this visit. Other relevant data reviewed during this visit included CBC, BMP, magnesium 

## 2024-05-06 NOTE — Assessment & Plan Note (Addendum)
 This is likely due to recent treatment. The patient denies recent history of bleeding such as epistaxis, hematuria or hematochezia. She is asymptomatic from the anemia. I will observe for now.  She does not require transfusion now. I will continue the chemotherapy at current dose without dosage adjustment.  If the anemia gets progressive worse in the future, I might have to delay her treatment or adjust the chemotherapy dose.

## 2024-05-06 NOTE — Patient Instructions (Signed)
 [facility] - A DEPT OF [facility]  Discharge Instructions: Thank you for choosing [facility] to provide your oncology and hematology care.   If you have a lab appointment with the Cancer Center, please go directly to the Cancer Center and check in at the registration area.   Wear comfortable clothing and clothing appropriate for easy access to any Portacath or PICC line.   We strive to give you quality time with your provider. You may need to reschedule your appointment if you arrive late (15 or more minutes).  Arriving late affects you and other patients whose appointments are after yours.  Also, if you miss three or more appointments without notifying the office, you may be dismissed from the clinic at the provider's discretion.      For prescription refill requests, have your pharmacy contact our office and allow 72 hours for refills to be completed.    Today you received the following chemotherapy and/or immunotherapy agents: CISplatin (PLATINOL)     To help prevent nausea and vomiting after your treatment, we encourage you to take your nausea medication as directed.  BELOW ARE SYMPTOMS THAT SHOULD BE REPORTED IMMEDIATELY: *FEVER GREATER THAN 100.4 F (38 C) OR HIGHER *CHILLS OR SWEATING *NAUSEA AND VOMITING THAT IS NOT CONTROLLED WITH YOUR NAUSEA MEDICATION *UNUSUAL SHORTNESS OF BREATH *UNUSUAL BRUISING OR BLEEDING *URINARY PROBLEMS (pain or burning when urinating, or frequent urination) *BOWEL PROBLEMS (unusual diarrhea, constipation, pain near the anus) TENDERNESS IN MOUTH AND THROAT WITH OR WITHOUT PRESENCE OF ULCERS (sore throat, sores in mouth, or a toothache) UNUSUAL RASH, SWELLING OR PAIN  UNUSUAL VAGINAL DISCHARGE OR ITCHING   Items with * indicate a potential emergency and should be followed up as soon as possible or go to the Emergency Department if any problems should occur.  Please show the CHEMOTHERAPY ALERT CARD or  IMMUNOTHERAPY ALERT CARD at check-in to the Emergency Department and triage nurse.  Should you have questions after your visit or need to cancel or reschedule your appointment, please contact [facility] - A DEPT OF [facility]  Dept: [phone]  and follow the prompts.  Office hours are 8:00 a.m. to 4:30 p.m. Monday - Friday. Please note that voicemails left after 4:00 p.m. may not be returned until the following business day.  We are closed weekends and major holidays. You have access to a nurse at all times for urgent questions. Please call the main number to the clinic Dept: [phone] and follow the prompts.   For any non-urgent questions, you may also contact your provider using MyChart. We now offer e-Visits for anyone 41 and older to request care online for non-urgent symptoms. For details visit [email or web address].   Also download the MyChart app! Go to the app store, search "MyChart", open the app, select [place], and log in with your MyChart username and password.

## 2024-05-06 NOTE — Assessment & Plan Note (Addendum)
 Disease status: Undetermined Evidence: Symptoms  The patient has abnormal Pap smear dated back to 2017.  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  Subsequent follow-up in October confirmed likelihood of recurrent disease.  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  There is present of lymphovascular invasion and perineural invasion  Her previous PD-L1 test from 2024 was negative, repeat PD-L1 on her latest tissue from October 2025 was 40% positive  She tolerated treatment poorly with recurrent nausea & vomiting, recent hospitalization for infection Her dose from last week was reduced and she tolerated better Will proceed with treatment today without delay

## 2024-05-09 ENCOUNTER — Ambulatory Visit
Admission: RE | Admit: 2024-05-09 | Discharge: 2024-05-09 | Disposition: A | Source: Ambulatory Visit | Attending: Radiation Oncology | Admitting: Radiation Oncology

## 2024-05-09 ENCOUNTER — Other Ambulatory Visit: Payer: Self-pay

## 2024-05-09 LAB — RAD ONC ARIA SESSION SUMMARY
Course Elapsed Days: 35
Plan Fractions Treated to Date: 24
Plan Prescribed Dose Per Fraction: 1.8 Gy
Plan Total Fractions Prescribed: 25
Plan Total Prescribed Dose: 45 Gy
Reference Point Dosage Given to Date: 43.2 Gy
Reference Point Session Dosage Given: 1.8 Gy
Session Number: 24

## 2024-05-10 ENCOUNTER — Ambulatory Visit
Admission: RE | Admit: 2024-05-10 | Discharge: 2024-05-10 | Disposition: A | Source: Ambulatory Visit | Attending: Radiation Oncology | Admitting: Radiation Oncology

## 2024-05-10 ENCOUNTER — Ambulatory Visit

## 2024-05-10 ENCOUNTER — Other Ambulatory Visit: Payer: Self-pay

## 2024-05-10 ENCOUNTER — Other Ambulatory Visit: Payer: Self-pay | Admitting: Radiation Oncology

## 2024-05-10 LAB — RAD ONC ARIA SESSION SUMMARY
Course Elapsed Days: 36
Plan Fractions Treated to Date: 25
Plan Prescribed Dose Per Fraction: 1.8 Gy
Plan Total Fractions Prescribed: 25
Plan Total Prescribed Dose: 45 Gy
Reference Point Dosage Given to Date: 45 Gy
Reference Point Session Dosage Given: 1.8 Gy
Session Number: 25

## 2024-05-10 MED ORDER — DIPHENOXYLATE-ATROPINE 2.5-0.025 MG PO TABS
1.0000 | ORAL_TABLET | Freq: Four times a day (QID) | ORAL | 0 refills | Status: AC | PRN
  Filled 2024-05-10: qty 60, 15d supply, fill #0

## 2024-05-11 ENCOUNTER — Other Ambulatory Visit (HOSPITAL_COMMUNITY): Payer: Self-pay

## 2024-05-11 ENCOUNTER — Ambulatory Visit

## 2024-05-11 ENCOUNTER — Other Ambulatory Visit: Payer: Self-pay

## 2024-05-12 ENCOUNTER — Inpatient Hospital Stay

## 2024-05-12 ENCOUNTER — Other Ambulatory Visit: Payer: Self-pay

## 2024-05-12 ENCOUNTER — Telehealth: Payer: Self-pay

## 2024-05-12 ENCOUNTER — Encounter: Payer: Self-pay | Admitting: Hematology and Oncology

## 2024-05-12 ENCOUNTER — Other Ambulatory Visit: Payer: Self-pay | Admitting: Radiology

## 2024-05-12 DIAGNOSIS — C531 Malignant neoplasm of exocervix: Secondary | ICD-10-CM

## 2024-05-12 DIAGNOSIS — C539 Malignant neoplasm of cervix uteri, unspecified: Secondary | ICD-10-CM

## 2024-05-12 DIAGNOSIS — C52 Malignant neoplasm of vagina: Secondary | ICD-10-CM

## 2024-05-12 LAB — BASIC METABOLIC PANEL - CANCER CENTER ONLY
Anion gap: 11 (ref 5–15)
BUN: 16 mg/dL (ref 6–20)
CO2: 26 mmol/L (ref 22–32)
Calcium: 9.3 mg/dL (ref 8.9–10.3)
Chloride: 101 mmol/L (ref 98–111)
Creatinine: 0.94 mg/dL (ref 0.44–1.00)
GFR, Estimated: >60 mL/min
Glucose, Bld: 107 mg/dL — ABNORMAL HIGH (ref 70–99)
Potassium: 4.1 mmol/L (ref 3.5–5.1)
Sodium: 138 mmol/L (ref 135–145)

## 2024-05-12 LAB — CBC WITH DIFFERENTIAL (CANCER CENTER ONLY)
Abs Immature Granulocytes: 0.02 K/uL (ref 0.00–0.07)
Basophils Absolute: 0 K/uL (ref 0.0–0.1)
Basophils Relative: 0 %
Eosinophils Absolute: 0 K/uL (ref 0.0–0.5)
Eosinophils Relative: 1 %
HCT: 32.6 % — ABNORMAL LOW (ref 36.0–46.0)
Hemoglobin: 11.3 g/dL — ABNORMAL LOW (ref 12.0–15.0)
Immature Granulocytes: 0 %
Lymphocytes Relative: 8 %
Lymphs Abs: 0.4 K/uL — ABNORMAL LOW (ref 0.7–4.0)
MCH: 31.2 pg (ref 26.0–34.0)
MCHC: 34.7 g/dL (ref 30.0–36.0)
MCV: 90.1 fL (ref 80.0–100.0)
Monocytes Absolute: 0.5 K/uL (ref 0.1–1.0)
Monocytes Relative: 9 %
Neutro Abs: 4.4 K/uL (ref 1.7–7.7)
Neutrophils Relative %: 82 %
Platelet Count: 285 K/uL (ref 150–400)
RBC: 3.62 MIL/uL — ABNORMAL LOW (ref 3.87–5.11)
RDW: 14.2 % (ref 11.5–15.5)
WBC Count: 5.3 K/uL (ref 4.0–10.5)
nRBC: 0 % (ref 0.0–0.2)

## 2024-05-12 LAB — MAGNESIUM: Magnesium: 1.9 mg/dL (ref 1.7–2.4)

## 2024-05-12 MED ORDER — TRAMADOL HCL 50 MG PO TABS: 50.0000 mg | ORAL_TABLET | Freq: Three times a day (TID) | ORAL | 0 refills | Status: AC

## 2024-05-12 MED FILL — Fosaprepitant Dimeglumine For IV Infusion 150 MG (Base Eq): INTRAVENOUS | Qty: 5 | Status: AC

## 2024-05-12 NOTE — Telephone Encounter (Signed)
 Patient called in to report having intermittent, sharp shooting pains to the vagina. Patient rates pain at 8/10. Continues to have painful bowel movements.  Reports no relief with extra strength tylenol . Patient completed treatment to the pelvis on 05/10/24.  Denies any bleeding or other issues.

## 2024-05-12 NOTE — Telephone Encounter (Signed)
 Can you please schedule patient to see me on Tuesday?

## 2024-05-12 NOTE — Progress Notes (Signed)
 Patient complaining of 8 out of 10 vaginal pain that has progressively worsened since completing her treatment.  She notices this pain daily.  She states at times she can feel like she can close her legs.  Pain is not responding to around-the-clock Tylenol .  She denies any vaginal bleeding, or blood in her urine or stool.  She denies any dysuria.  She notes pain with bowel movements and is having approximately 2 bowel movements per day.  Her presentation is atypical for her radiation treatment.  Patient is coming in for physical exam on 05/17/2024 for further evaluation.  Prescription for tramadol  given to help with pain in the interim.   She understands to present to the ED if she develops pain uncontrolled with tramadol  when we are not available.     Leeroy Due, PA-C

## 2024-05-13 ENCOUNTER — Encounter: Payer: Self-pay | Admitting: Licensed Clinical Social Worker

## 2024-05-13 ENCOUNTER — Inpatient Hospital Stay

## 2024-05-13 ENCOUNTER — Encounter: Payer: Self-pay | Admitting: Hematology and Oncology

## 2024-05-13 ENCOUNTER — Inpatient Hospital Stay: Admitting: Hematology and Oncology

## 2024-05-13 VITALS — BP 137/76 | HR 99 | Temp 98.7°F | Resp 16 | Ht 70.0 in | Wt 218.0 lb

## 2024-05-13 DIAGNOSIS — D6481 Anemia due to antineoplastic chemotherapy: Secondary | ICD-10-CM

## 2024-05-13 DIAGNOSIS — T451X5A Adverse effect of antineoplastic and immunosuppressive drugs, initial encounter: Secondary | ICD-10-CM

## 2024-05-13 DIAGNOSIS — C531 Malignant neoplasm of exocervix: Secondary | ICD-10-CM

## 2024-05-13 DIAGNOSIS — C539 Malignant neoplasm of cervix uteri, unspecified: Secondary | ICD-10-CM

## 2024-05-13 MED ORDER — DEXAMETHASONE SOD PHOSPHATE PF 10 MG/ML IJ SOLN
10.0000 mg | Freq: Once | INTRAMUSCULAR | Status: AC
  Administered 2024-05-13: 10 mg via INTRAVENOUS
  Filled 2024-05-13: qty 1

## 2024-05-13 MED ORDER — SODIUM CHLORIDE 0.9 % IV SOLN: INTRAVENOUS | Status: DC

## 2024-05-13 MED ORDER — MAGNESIUM SULFATE 2 GM/50ML IV SOLN
2.0000 g | Freq: Once | INTRAVENOUS | Status: AC
  Administered 2024-05-13: 2 g via INTRAVENOUS
  Filled 2024-05-13: qty 50

## 2024-05-13 MED ORDER — SODIUM CHLORIDE 0.9 % IV SOLN
150.0000 mg | Freq: Once | INTRAVENOUS | Status: AC
  Administered 2024-05-13: 150 mg via INTRAVENOUS
  Filled 2024-05-13: qty 150

## 2024-05-13 MED ORDER — SODIUM CHLORIDE 0.9 % IV SOLN
32.0000 mg/m2 | Freq: Once | INTRAVENOUS | Status: AC
  Administered 2024-05-13: 64 mg via INTRAVENOUS
  Filled 2024-05-13: qty 64

## 2024-05-13 MED ORDER — PALONOSETRON HCL INJECTION 0.25 MG/5ML
0.2500 mg | Freq: Once | INTRAVENOUS | Status: AC
  Administered 2024-05-13: 0.25 mg via INTRAVENOUS
  Filled 2024-05-13: qty 5

## 2024-05-13 MED ORDER — POTASSIUM CHLORIDE IN NACL 20-0.9 MEQ/L-% IV SOLN
Freq: Once | INTRAVENOUS | Status: AC
  Filled 2024-05-13: qty 1000

## 2024-05-13 NOTE — Progress Notes (Signed)
 [facility] OFFICE PROGRESS NOTE  Patient Care Team: [name], [name], NP as PCP - General (Family Medicine) [name] Comer SAUNDERS, MD as Consulting Physician (Gynecologic Oncology) Devona Darice SAUNDERS, RN as Oncology Nurse Navigator (Oncology)  Assessment & Plan Malignant neoplasm of cervix, unspecified site [facility]) Disease status: Undetermined Evidence: Symptoms  The patient has abnormal Pap smear dated back to 2017.  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  Subsequent follow-up in October confirmed likelihood of recurrent disease.  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  There is present of lymphovascular invasion and perineural invasion  Her previous PD-L1 test from 2024 was negative, repeat PD-L1 on her latest tissue from October 2025 was 40% positive  Initially, she tolerated chemotherapy combination of cisplatin  and pembrolizumab  poorly with recurrent nausea & vomiting, recent hospitalization for infection Since dose reduction, her symptoms has improved She will complete final treatment today Next week, she will switch over to maintenance pembrolizumab  only Anemia due to antineoplastic chemotherapy This is likely due to recent treatment. The patient denies recent history of bleeding such as epistaxis, hematuria or hematochezia. She is asymptomatic from the anemia. I will observe for now.  She does not require transfusion now. I will continue the chemotherapy at current dose without dosage adjustment.  If the anemia gets progressive worse in the future, I might have to delay her treatment or adjust the chemotherapy dose.   No orders  of the defined types were placed in this encounter.    [name], MD  INTERVAL HISTORY: she returns for treatment follow-up Complications related to previous cycle of chemotherapy included anemia,  PHYSICAL EXAMINATION: ECOG PERFORMANCE STATUS: 0 - Asymptomatic  No results found for: CAN125    Latest Ref Rng & Units 05/12/2024    3:02 PM 05/05/2024    3:23 PM 05/01/2024    2:57 AM  CBC  WBC 4.0 - 10.5 K/uL 5.3  5.6  7.1   Hemoglobin 12.0 - 15.0 g/dL 88.6  88.1  87.4   Hematocrit 36.0 - 46.0 % 32.6  33.5  37.1   Platelets 150 - 400 K/uL 285  312  288       Chemistry      Component Value Date/Time   NA 138 05/12/2024 1502   NA 139 10/10/2015 1623   K 4.1 05/12/2024 1502   CL 101 05/12/2024 1502   CO2 26 05/12/2024 1502   BUN 16 05/12/2024 1502   BUN 7 10/10/2015 1623   CREATININE 0.94 05/12/2024 1502   CREATININE 0.82 12/16/2023 1019      Component Value Date/Time   CALCIUM  9.3 05/12/2024 1502   ALKPHOS 71 05/01/2024 0257   AST 18 05/01/2024 0257   AST 21 04/28/2024 1500   ALT 33 05/01/2024 0257   ALT 25 04/28/2024 1500   BILITOT 0.3 05/01/2024 0257   BILITOT 0.4 04/28/2024 1500       There were no vitals filed for this visit. There were no vitals filed for this visit. Other relevant data reviewed during this visit included CBC, BMP, magnesium 

## 2024-05-13 NOTE — Patient Instructions (Addendum)
 [facility] - A DEPT OF [facility]  Discharge Instructions: Thank you for choosing [facility] to provide your oncology and hematology care.   If you have a lab appointment with the Cancer Center, please go directly to the Cancer Center and check in at the registration area.   Wear comfortable clothing and clothing appropriate for easy access to any Portacath or PICC line.   We strive to give you quality time with your provider. You may need to reschedule your appointment if you arrive late (15 or more minutes).  Arriving late affects you and other patients whose appointments are after yours.  Also, if you miss three or more appointments without notifying the office, you may be dismissed from the clinic at the provider's discretion.      For prescription refill requests, have your pharmacy contact our office and allow 72 hours for refills to be completed.    Today you received the following chemotherapy and/or immunotherapy agents: Cisplatin  (Platinol )    To help prevent nausea and vomiting after your treatment, we encourage you to take your nausea medication as directed.  BELOW ARE SYMPTOMS THAT SHOULD BE REPORTED IMMEDIATELY: *FEVER GREATER THAN 100.4 F (38 C) OR HIGHER *CHILLS OR SWEATING *NAUSEA AND VOMITING THAT IS NOT CONTROLLED WITH YOUR NAUSEA MEDICATION *UNUSUAL SHORTNESS OF BREATH *UNUSUAL BRUISING OR BLEEDING *URINARY PROBLEMS (pain or burning when urinating, or frequent urination) *BOWEL PROBLEMS (unusual diarrhea, constipation, pain near the anus) TENDERNESS IN MOUTH AND THROAT WITH OR WITHOUT PRESENCE OF ULCERS (sore throat, sores in mouth, or a toothache) UNUSUAL RASH, SWELLING OR PAIN  UNUSUAL VAGINAL DISCHARGE OR ITCHING   Items with * indicate a potential emergency and should be followed up as soon as possible or go to the Emergency Department if any problems should occur.  Please show the CHEMOTHERAPY ALERT CARD or  IMMUNOTHERAPY ALERT CARD at check-in to the Emergency Department and triage nurse.  Should you have questions after your visit or need to cancel or reschedule your appointment, please contact [facility] - A DEPT OF [facility]  Dept: [phone]  and follow the prompts.  Office hours are 8:00 a.m. to 4:30 p.m. Monday - Friday. Please note that voicemails left after 4:00 p.m. may not be returned until the following business day.  We are closed weekends and major holidays. You have access to a nurse at all times for urgent questions. Please call the main number to the clinic Dept: [phone] and follow the prompts.   For any non-urgent questions, you may also contact your provider using MyChart. We now offer e-Visits for anyone 42 and older to request care online for non-urgent symptoms. For details visit [email or web address].   Also download the MyChart app! Go to the app store, search MyChart, open the app, select , and log in with your MyChart username and password.

## 2024-05-13 NOTE — Progress Notes (Signed)
 Provided bag from [employer].   Firas Guardado E Romey Mathieson, LCSW

## 2024-05-13 NOTE — Assessment & Plan Note (Addendum)
 Disease status: Undetermined Evidence: Symptoms  The patient has abnormal Pap smear dated back to 2017.  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  Subsequent follow-up in October confirmed likelihood of recurrent disease.  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  There is present of lymphovascular invasion and perineural invasion  Her previous PD-L1 test from 2024 was negative, repeat PD-L1 on her latest tissue from October 2025 was 40% positive  Initially, she tolerated chemotherapy combination of cisplatin  and pembrolizumab  poorly with recurrent nausea & vomiting, recent hospitalization for infection Since dose reduction, her symptoms has improved She will complete final treatment today Next week, she will switch over to maintenance pembrolizumab  only

## 2024-05-13 NOTE — Assessment & Plan Note (Addendum)
 This is likely due to recent treatment. The patient denies recent history of bleeding such as epistaxis, hematuria or hematochezia. She is asymptomatic from the anemia. I will observe for now.  She does not require transfusion now. I will continue the chemotherapy at current dose without dosage adjustment.  If the anemia gets progressive worse in the future, I might have to delay her treatment or adjust the chemotherapy dose.

## 2024-05-17 ENCOUNTER — Other Ambulatory Visit: Payer: Self-pay

## 2024-05-17 ENCOUNTER — Ambulatory Visit: Admitting: Radiology

## 2024-05-17 ENCOUNTER — Encounter: Payer: Self-pay | Admitting: Family

## 2024-05-17 NOTE — Progress Notes (Incomplete)
 Holly Brown is here today for follow up post radiation to the pelvic.  They completed their radiation on: 05/10/24  Does the patient complain of any of the following:  Pain: Patient complaining of 8 out of 10 vaginal pain that has progressively worsened since completing her treatment.  Abdominal bloating: *** Diarrhea/Constipation: *** Nausea/Vomiting: *** Vaginal Discharge: *** Blood in Urine or Stool: *** Urinary Issues (dysuria/incomplete emptying/ incontinence/ increased frequency/urgency): *** Does patient report using vaginal dilator 2-3 times a week and/or sexually active 2-3 weeks: *** Post radiation skin changes: ***   Additional comments if applicable:

## 2024-05-18 ENCOUNTER — Telehealth: Payer: Self-pay | Admitting: *Deleted

## 2024-05-18 NOTE — Progress Notes (Signed)
 "  Radiation Oncology         [phone] ________________________________  Name: Holly Brown MRN: [id number]  Date: 05/19/2024  DOB: April 18, 1986  Vaginal Brachytherapy Procedure Note  CC: [name], [name], NP [name] Comer SAUNDERS, MD  No diagnosis found.  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 She was last seen in office on 02/24/24 for her initial consultation.   In the interval since she was seen, she completed her adjuvant chemotherapy with cisplatin  and pembrolizumab . However her first cycle was tolerated poorly with recurrent nausea & vomiting, and hospitalization for infection on 05/01/24. As a result, dose was reduced with improved symptoms. Her last treatment was done on 05/13/24. Patient was then switched to maintenance pembrolizumab .    She underwent a CT cap on 04/25/24 showing no acute findings in the abdomen or pelvis. Scan did indicate mild bladder wall thickening, possibly related to radiation therapy or bacterial Cystitis.   No other significant oncologic interval history since the patient was last seen.   ALLERGIES: is allergic to keflex  [cephalexin ].  Meds: Current Outpatient Medications  Medication Sig Dispense Refill   acetaminophen  (TYLENOL ) 500 MG tablet Take 1,000 mg by mouth every 6 (six) hours as needed for moderate pain (pain score 4-6), fever or headache.     dexamethasone  (DECADRON ) 4 MG tablet Take 2 tablets by mouth daily for 2 days after chemo every week x 4 weeks 20 tablet 0   diphenoxylate -atropine  (LOMOTIL ) 2.5-0.025 MG tablet Take 1 tablet by mouth 4 (four) times daily as needed for diarrhea or loose stools. Do not exceed 8 tablets per day 60 tablet 0   lidocaine -prilocaine  (EMLA ) cream Apply to affected area once 30 g 3    nitrofurantoin , macrocrystal-monohydrate, (MACROBID ) 100 MG capsule Take 1 capsule (100 mg total) by mouth 2 (two) times daily. 14 capsule 0   omeprazole  (PRILOSEC OTC) 20 MG tablet Take 20 mg by mouth daily as needed.     ondansetron  (ZOFRAN ) 8 MG tablet Take 1 tablet (8 mg total) by mouth every 8 hours as needed for nausea or vomiting. Start on the third day after cisplatin . 30 tablet 1   oxybutynin  (DITROPAN  XL) 10 MG 24 hr tablet Take 1 tablet (10 mg total) by mouth at bedtime. 7 tablet 0   phenazopyridine  (PYRIDIUM ) 100 MG tablet Take 1 tablet (100 mg total) by mouth 3 (three) times daily as needed for pain. 12 tablet 0   polyethylene glycol powder (GLYCOLAX /MIRALAX ) 17 GM/SCOOP powder Take 233.75 g by mouth once for 1 dose Take according to your procedure prep instructions. (Patient taking differently: Take 238 g by mouth in the morning and at bedtime.) 238 g 0   prochlorperazine  (COMPAZINE ) 10 MG tablet Take 1 tablet (10 mg total) by mouth every 6 (six) hours as needed for nausea or vomiting. 30 tablet 1   traMADol  (ULTRAM ) 50 MG tablet Take 1 tablet (50 mg total) by mouth every 8 (eight) hours. 20 tablet 0   No current facility-administered medications for this visit.    Physical Findings: The patient is in no acute distress. Patient is alert and oriented.  vitals were not taken for this visit.   No palpable cervical, supraclavicular or axillary lymphoadenopathy. The heart has a regular rate and rhythm. The lungs are clear to auscultation.  Abdomen soft and non-tender.  On pelvic examination the external genitalia were unremarkable. A speculum exam was performed. Vaginal cuff intact, no mucosal lesions. On bimanual exam there were no pelvic masses appreciated.  Lab Findings: Lab Results  Component Value Date   WBC 5.3 05/12/2024   HGB 11.3 (L) 05/12/2024   HCT 32.6 (L) 05/12/2024   MCV 90.1 05/12/2024   PLT 285 05/12/2024    Radiographic Findings: CT Renal Stone Study Result  Date: 05/02/2024 EXAM: CT ABDOMEN AND PELVIS WITHOUT CONTRAST 05/02/2024 03:51:34 AM TECHNIQUE: CT of the abdomen and pelvis was performed without the administration of intravenous contrast. Multiplanar reformatted images are provided for review. Automated exposure control, iterative reconstruction, and/or weight-based adjustment of the mA/kV was utilized to reduce the radiation dose to as low as reasonably achievable. COMPARISON: CT abdomen and pelvis 04/25/2024. CLINICAL HISTORY: Abdominal/flank pain, stone suspected. FINDINGS: LOWER CHEST: No acute abnormality. LIVER: The liver is unremarkable. GALLBLADDER AND BILE DUCTS: Gallbladder is contracted. No biliary ductal dilatation. SPLEEN: No acute abnormality. PANCREAS: No acute abnormality. ADRENAL GLANDS: No acute abnormality. KIDNEYS, URETERS AND BLADDER: No stones in the kidneys or ureters. No hydronephrosis. No perinephric or periureteral stranding. Urinary bladder is unremarkable. GI AND BOWEL: Stomach demonstrates no acute abnormality. No small or large bowel thickening or dilatation. The appendix is unremarkable. PERITONEUM AND RETROPERITONEUM: No ascites. No free air. VASCULATURE: Aorta is normal in caliber. LYMPH NODES: No lymphadenopathy. REPRODUCTIVE ORGANS: Bilateral tubal ligation. Status post hysterectomy. No adnexal mass. The ovaries are grossly unremarkable. BONES AND SOFT TISSUES: No acute osseous abnormality. Diastasis recti. No focal soft tissue abnormality. IMPRESSION: 1. No acute findings in the abdomen or pelvis with limited evaluation of this noncontrast study . Electronically signed by: Morgane Naveau MD MD 05/02/2024 03:58 AM EST RP Workstation: [id number]   CT ABDOMEN PELVIS W CONTRAST Result Date: 04/25/2024 EXAM: CT ABDOMEN AND PELVIS WITH CONTRAST 04/25/2024 02:04:09 AM TECHNIQUE: CT of the abdomen and pelvis was performed with the administration of intravenous contrast. Multiplanar reformatted images are provided for review.  Automated exposure control, iterative reconstruction, and/or weight-based adjustment of the mA/kV was utilized to reduce the radiation dose to as low as reasonably achievable. COMPARISON: PET CT from 02/08/2024, CT abdomen and pelvis with no contrast 02/21/2024. CLINICAL HISTORY: LLQ abdominal pain. Patient is undergoing radiation treatment for cervical cancer, last treatment 04/22/2024, with decreasing oral intake. FINDINGS: LOWER CHEST: No acute abnormality. LIVER: The liver is 19 cm in length with mild steatosis, including focal periligamentous fat in segment 4b. There is no mass enhancement. GALLBLADDER AND BILE DUCTS: Gallbladder is unremarkable. No biliary ductal dilatation. SPLEEN: No acute abnormality. PANCREAS: No acute abnormality. ADRENAL GLANDS: No acute abnormality. There is no adrenal mass. KIDNEYS, URETERS AND BLADDER: No stones in the kidneys or ureters. No hydronephrosis. No perinephric or periureteral stranding. There is no renal mass. The bladder is thickened, more than expected for simple nondistention. This could be due to XRT or bacterial cystitis. Correlate with urinalysis for potential significance. GI AND BOWEL: Stomach demonstrates no acute abnormality. There is no bowel obstruction or inflammation. The appendix is normal. There is moderate retained stool in the ascending and transverse colon. PERITONEUM AND RETROPERITONEUM: No ascites. No free air. No free fluid or localizing collections. VASCULATURE: Aorta is normal in caliber. LYMPH NODES: No lymphadenopathy. REPRODUCTIVE ORGANS: The uterus is absent. No adnexal mass is seen. There are bilateral adnexal surgical clips. BONES AND SOFT TISSUES: Bilateral chronic nonerosive sacroiliitis is again shown. No worrisome focal bone  lesions. Early spurring change lower LUMBAR facet joints. There's no concerning regional bone lesion. There is a small fat-containing umbilical hernia. IMPRESSION: 1. No acute CT  findings in the abdomen or pelvis. 2.  Bladder wall thickening, possibly related to radiation therapy or bacterial cystitis; consider urinalysis. 3. No appreciable  metastatic disease. No local adenopathy . 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 The patient will be treated with a *** cm diameter cylinder with a treatment length of *** cm. This distended the vaginal vault without undue discomfort. The patient tolerated the procedure well.  The patient was successfully fitted for a vaginal cylinder. The patient is appropriate to begin vaginal brachytherapy.   Plan: The patient will proceed with CT simulation and vaginal brachytherapy today.    _______________________________   Lynwood CHARM Nasuti, PhD, MD  This document serves as a record of services personally performed by Lynwood Nasuti, MD. It was created on his behalf by Reymundo Cartwright, a trained medical scribe. The creation of this record is based on the scribe's personal observations and the provider's statements to them. This document has been checked and approved by the attending provider.  "

## 2024-05-18 NOTE — Progress Notes (Signed)
 Holly Brown is here today for follow up  new vaginal cylinder fitting.  They completed their external beam radiation on:  05/10/24  Does the patient complain of any of the following:  Pain:*** Abdominal bloating: *** Diarrhea/Constipation: *** Nausea/Vomiting: *** Vaginal Discharge: *** Blood in Urine or Stool: *** Urinary Issues (dysuria/incomplete emptying/ incontinence/ increased frequency/urgency): *** Does patient report using vaginal dilator 2-3 times a week and/or sexually active 2-3 weeks: *** Post radiation skin changes: ***   Additional comments if applicable:

## 2024-05-18 NOTE — Progress Notes (Signed)
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 She will be treated with a 3.0 cm diameter segmented cylinder. This conforms to her anatomy without undue discomfort.  Vaginal brachytherapy procedure node: The patient was brought to the HDR suite. Identity was confirmed. All relevant records and images related to the planned course of therapy were reviewed. The patient freely provided informed written consent to proceed with treatment after reviewing the details related to the planned course of therapy. The consent form was witnessed and verified by the simulation staff. Then, the patient was set-up in a stable reproducible supine position for radiation therapy. Pelvic exam revealed the vaginal cuff to be intact . The patient's custom vaginal cylinder was placed in the proximal vagina. This was affixed to the CT/MR stabilization plate to prevent slippage. Patient tolerated the placement well.  Verification simulation note:  A fiducial marker was placed within the vaginal cylinder. An AP and lateral film was then obtained through the pelvis area. This documented accurate position of the vaginal cylinder for treatment.  HDR BRACHYTHERAPY TREATMENT  The remote afterloading device was affixed to the vaginal cylinder by catheter. Patient then proceeded to undergo her first high-dose-rate treatment directed at the proximal vagina. The patient was prescribed a dose of 6.0 gray to be delivered to the mucosal surface. Treatment length was 2.5 cm. Patient was treated with 1 channel using 5 dwell positions. Treatment time was 178.2 seconds. Iridium 192 was the high-dose-rate source for  treatment. The patient tolerated the treatment well. After completion of her therapy, a radiation survey was performed documenting return of the iridium source into the [employer] safe.   PLAN: Patient will return next week to undergo her second high-dose-rate treatment. ________________________________  Lynwood CHARM Nasuti, PhD, MD   This document serves as a record of services personally performed by Lynwood Nasuti, MD. It was created on his behalf by Reymundo Cartwright, a trained medical scribe. The creation of this record is based on the scribe's personal observations and the provider's statements to them. This document has been checked and approved by the attending provider.  "

## 2024-05-18 NOTE — Telephone Encounter (Signed)
 CALLED PATIENT TO REMIND OF NEW HDR V[facility] FOR 05-19-24, LVM FOR A RETURN CALL

## 2024-05-19 ENCOUNTER — Ambulatory Visit
Admission: RE | Admit: 2024-05-19 | Discharge: 2024-05-19 | Disposition: A | Discharge status: Home / Self Care | Source: Ambulatory Visit | Attending: Radiation Oncology | Admitting: Radiation Oncology

## 2024-05-19 ENCOUNTER — Inpatient Hospital Stay

## 2024-05-19 ENCOUNTER — Encounter: Payer: Self-pay | Admitting: Radiation Oncology

## 2024-05-19 ENCOUNTER — Other Ambulatory Visit: Payer: Self-pay

## 2024-05-19 VITALS — BP 125/87 | HR 96 | Temp 97.2°F | Resp 18 | Ht 70.0 in | Wt 215.4 lb

## 2024-05-19 DIAGNOSIS — C531 Malignant neoplasm of exocervix: Secondary | ICD-10-CM

## 2024-05-19 DIAGNOSIS — C52 Malignant neoplasm of vagina: Secondary | ICD-10-CM

## 2024-05-19 DIAGNOSIS — R3915 Urgency of urination: Secondary | ICD-10-CM

## 2024-05-19 DIAGNOSIS — C539 Malignant neoplasm of cervix uteri, unspecified: Secondary | ICD-10-CM

## 2024-05-19 LAB — URINALYSIS, COMPLETE (UACMP) WITH MICROSCOPIC
Bilirubin Urine: NEGATIVE
Glucose, UA: NEGATIVE mg/dL
Hgb urine dipstick: NEGATIVE
Ketones, ur: NEGATIVE mg/dL
Nitrite: NEGATIVE
Protein, ur: 100 mg/dL — AB
Specific Gravity, Urine: 1.017 (ref 1.005–1.030)
WBC, UA: >50 WBC/hpf (ref 0–5)
pH: 5 (ref 5.0–8.0)

## 2024-05-19 LAB — CBC WITH DIFFERENTIAL (CANCER CENTER ONLY)
Abs Immature Granulocytes: 0.01 10*3/uL (ref 0.00–0.07)
Basophils Absolute: 0 10*3/uL (ref 0.0–0.1)
Basophils Relative: 0 %
Eosinophils Absolute: 0 10*3/uL (ref 0.0–0.5)
Eosinophils Relative: 1 %
HCT: 32.2 % — ABNORMAL LOW (ref 36.0–46.0)
Hemoglobin: 11.3 g/dL — ABNORMAL LOW (ref 12.0–15.0)
Immature Granulocytes: 0 %
Lymphocytes Relative: 12 %
Lymphs Abs: 0.5 10*3/uL — ABNORMAL LOW (ref 0.7–4.0)
MCH: 31.5 pg (ref 26.0–34.0)
MCHC: 35.1 g/dL (ref 30.0–36.0)
MCV: 89.7 fL (ref 80.0–100.0)
Monocytes Absolute: 0.4 10*3/uL (ref 0.1–1.0)
Monocytes Relative: 9 %
Neutro Abs: 2.9 10*3/uL (ref 1.7–7.7)
Neutrophils Relative %: 78 %
Platelet Count: 230 10*3/uL (ref 150–400)
RBC: 3.59 MIL/uL — ABNORMAL LOW (ref 3.87–5.11)
RDW: 14.9 % (ref 11.5–15.5)
WBC Count: 3.8 10*3/uL — ABNORMAL LOW (ref 4.0–10.5)
nRBC: 0 % (ref 0.0–0.2)

## 2024-05-19 LAB — RAD ONC ARIA SESSION SUMMARY
Course Elapsed Days: 45
Plan Fractions Treated to Date: 1
Plan Prescribed Dose Per Fraction: 6 Gy
Plan Total Fractions Prescribed: 3
Plan Total Prescribed Dose: 18 Gy
Reference Point Dosage Given to Date: 6 Gy
Reference Point Session Dosage Given: 6 Gy
Session Number: 26

## 2024-05-19 LAB — CMP (CANCER CENTER ONLY)
ALT: 27 U/L (ref 0–44)
AST: 18 U/L (ref 15–41)
Albumin: 4.2 g/dL (ref 3.5–5.0)
Alkaline Phosphatase: 82 U/L (ref 38–126)
Anion gap: 13 (ref 5–15)
BUN: 16 mg/dL (ref 6–20)
CO2: 26 mmol/L (ref 22–32)
Calcium: 9.4 mg/dL (ref 8.9–10.3)
Chloride: 101 mmol/L (ref 98–111)
Creatinine: 0.98 mg/dL (ref 0.44–1.00)
GFR, Estimated: >60 mL/min
Glucose, Bld: 119 mg/dL — ABNORMAL HIGH (ref 70–99)
Potassium: 3.9 mmol/L (ref 3.5–5.1)
Sodium: 139 mmol/L (ref 135–145)
Total Bilirubin: 0.4 mg/dL (ref 0.0–1.2)
Total Protein: 7.1 g/dL (ref 6.5–8.1)

## 2024-05-19 LAB — TSH: TSH: 0.876 u[IU]/mL (ref 0.350–4.500)

## 2024-05-20 ENCOUNTER — Inpatient Hospital Stay

## 2024-05-20 ENCOUNTER — Inpatient Hospital Stay: Admitting: Hematology and Oncology

## 2024-05-20 ENCOUNTER — Encounter: Payer: Self-pay | Admitting: Hematology and Oncology

## 2024-05-20 ENCOUNTER — Encounter: Payer: Self-pay | Admitting: Licensed Clinical Social Worker

## 2024-05-20 VITALS — BP 120/86 | HR 92 | Temp 97.8°F | Resp 16 | Wt 215.3 lb

## 2024-05-20 DIAGNOSIS — C539 Malignant neoplasm of cervix uteri, unspecified: Secondary | ICD-10-CM | POA: Diagnosis not present

## 2024-05-20 DIAGNOSIS — D61818 Other pancytopenia: Secondary | ICD-10-CM | POA: Diagnosis not present

## 2024-05-20 DIAGNOSIS — Z95828 Presence of other vascular implants and grafts: Secondary | ICD-10-CM | POA: Insufficient documentation

## 2024-05-20 DIAGNOSIS — C531 Malignant neoplasm of exocervix: Secondary | ICD-10-CM

## 2024-05-20 LAB — T4: T4, Total: 8.4 ug/dL (ref 4.5–12.0)

## 2024-05-20 MED ORDER — SODIUM CHLORIDE 0.9 % IV SOLN
200.0000 mg | Freq: Once | INTRAVENOUS | Status: AC
  Administered 2024-05-20: 200 mg via INTRAVENOUS
  Filled 2024-05-20: qty 200

## 2024-05-20 MED ORDER — SODIUM CHLORIDE 0.9 % IV SOLN: INTRAVENOUS | Status: DC

## 2024-05-20 MED ORDER — SODIUM CHLORIDE 0.9% FLUSH: 10.0000 mL | INTRAVENOUS | Status: DC | PRN

## 2024-05-20 NOTE — Assessment & Plan Note (Addendum)
 She has a lot of discomfort at the site of her port Previously, she had been evaluated and on examination today, her port is in place without signs or symptoms of infection I think the patient is very sensitive to the presence of a port Since she has completed cisplatin  chemotherapy, we discussed risk and benefits of port removal She is undecided For discomfort, I recommend the patient to take over-the-counter acetaminophen  for pain relief

## 2024-05-20 NOTE — Progress Notes (Signed)
 [facility] OFFICE PROGRESS NOTE  Patient Care Team: [name], [name], NP as PCP - General (Family Medicine) [name] Comer SAUNDERS, MD as Consulting Physician (Gynecologic Oncology) Devona Darice SAUNDERS, RN as Oncology Nurse Navigator (Oncology)  Assessment & Plan Malignant neoplasm of cervix, unspecified site [facility]) Disease status: Undetermined Evidence: Symptoms  The patient has abnormal Pap smear dated back to 2017.  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  Subsequent follow-up in October confirmed likelihood of recurrent disease.  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  There is present of lymphovascular invasion and perineural invasion  Her previous PD-L1 test from 2024 was negative, repeat PD-L1 on her latest tissue from October 2025 was 40% positive  Initially, she tolerated chemotherapy combination of cisplatin  and pembrolizumab  poorly with recurrent nausea & vomiting, recent hospitalization for infection Since dose reduction, her symptoms has improved She has recently completed chemotherapy She is currently on immunotherapy only I will see her again in 3 weeks for further follow-up Pancytopenia, acquired (HCC) Due to treatment She is not symptomatic Observe Port-A-Cath in place She has a lot of discomfort at the site of her port Previously, she had been evaluated and on examination today, her port is in place without signs or symptoms of infection I think the patient is very sensitive to the presence of a port Since she has completed cisplatin  chemotherapy, we discussed risk and benefits of port removal She is undecided For  discomfort, I recommend the patient to take over-the-counter acetaminophen  for pain relief  No orders of the defined types were placed in this encounter.    [name], MD  INTERVAL HISTORY: she returns for treatment follow-up Complications related to previous cycle of chemotherapy included pancytopenia, and discomfort at the port site I reviewed test results with the patient PHYSICAL EXAMINATION: ECOG PERFORMANCE STATUS: 1 - Symptomatic but completely ambulatory  No results found for: CAN125    Latest Ref Rng & Units 05/19/2024    2:53 PM 05/12/2024    3:02 PM 05/05/2024    3:23 PM  CBC  WBC 4.0 - 10.5 K/uL 3.8  5.3  5.6   Hemoglobin 12.0 - 15.0 g/dL 88.6  88.6  88.1   Hematocrit 36.0 - 46.0 % 32.2  32.6  33.5   Platelets 150 - 400 K/uL 230  285  312       Chemistry      Component Value Date/Time   NA 139 05/19/2024 1453   NA 139 10/10/2015 1623   K 3.9 05/19/2024 1453   CL 101 05/19/2024 1453   CO2 26 05/19/2024 1453   BUN 16 05/19/2024 1453   BUN 7 10/10/2015 1623   CREATININE 0.98 05/19/2024 1453   CREATININE 0.82 12/16/2023 1019      Component Value Date/Time   CALCIUM  9.4 05/19/2024 1453   ALKPHOS 82 05/19/2024 1453   AST 18 05/19/2024 1453   ALT 27 05/19/2024 1453   BILITOT 0.4 05/19/2024 1453     Careful examination of her port site revealed no other abnormalities  Other relevant data reviewed during this visit included CBC and CMP

## 2024-05-20 NOTE — Progress Notes (Signed)
 Provided bag from [employer].   Firas Guardado E Romey Mathieson, LCSW

## 2024-05-20 NOTE — Patient Instructions (Signed)

## 2024-05-20 NOTE — Assessment & Plan Note (Addendum)
 Due to treatment She is not symptomatic Observe

## 2024-05-20 NOTE — Assessment & Plan Note (Addendum)
 Disease status: Undetermined Evidence: Symptoms  The patient has abnormal Pap smear dated back to 2017.  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  Subsequent follow-up in October confirmed likelihood of recurrent disease.  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  There is present of lymphovascular invasion and perineural invasion  Her previous PD-L1 test from 2024 was negative, repeat PD-L1 on her latest tissue from October 2025 was 40% positive  Initially, she tolerated chemotherapy combination of cisplatin  and pembrolizumab  poorly with recurrent nausea & vomiting, recent hospitalization for infection Since dose reduction, her symptoms has improved She has recently completed chemotherapy She is currently on immunotherapy only I will see her again in 3 weeks for further follow-up

## 2024-05-21 LAB — URINE CULTURE: Culture: >=100000 — AB

## 2024-05-23 ENCOUNTER — Encounter: Admitting: Family

## 2024-05-24 ENCOUNTER — Other Ambulatory Visit (HOSPITAL_COMMUNITY): Payer: Self-pay

## 2024-05-24 ENCOUNTER — Other Ambulatory Visit: Payer: Self-pay | Admitting: Radiation Oncology

## 2024-05-24 ENCOUNTER — Telehealth: Payer: Self-pay

## 2024-05-24 ENCOUNTER — Other Ambulatory Visit: Payer: Self-pay | Admitting: Hematology and Oncology

## 2024-05-24 ENCOUNTER — Other Ambulatory Visit: Payer: Self-pay

## 2024-05-24 DIAGNOSIS — C539 Malignant neoplasm of cervix uteri, unspecified: Secondary | ICD-10-CM

## 2024-05-24 MED ORDER — NITROFURANTOIN MONOHYD MACRO 100 MG PO CAPS
100.0000 mg | ORAL_CAPSULE | Freq: Two times a day (BID) | ORAL | 0 refills | Status: AC
  Filled 2024-05-24 (×2): qty 14, 7d supply, fill #0

## 2024-05-24 NOTE — Telephone Encounter (Signed)
 She called and left a message. She is requesting to have port removed due to it being so uncomfortable.

## 2024-05-24 NOTE — Telephone Encounter (Signed)
 Called and told her Dr. Lonn placed a order for port removal. She verbalized understanding.

## 2024-05-24 NOTE — Telephone Encounter (Signed)
 I palced order for port removal

## 2024-05-25 ENCOUNTER — Telehealth: Payer: Self-pay | Admitting: *Deleted

## 2024-05-25 ENCOUNTER — Other Ambulatory Visit: Payer: Self-pay

## 2024-05-25 NOTE — Progress Notes (Signed)
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 She will be treated with a 3.0 cm diameter segmented cylinder. This conforms to her anatomy without undue discomfort.  Vaginal brachytherapy procedure node: The patient was brought to the HDR suite. Identity was confirmed. All relevant records and images related to the planned course of therapy were reviewed. The patient freely provided informed written consent to proceed with treatment after reviewing the details related to the planned course of therapy. The consent form was witnessed and verified by the simulation staff. Then, the patient was set-up in a stable reproducible supine position for radiation therapy. Pelvic exam revealed the vaginal cuff to be intact . The patient's custom vaginal cylinder was placed in the proximal vagina. This was affixed to the CT/MR stabilization plate to prevent slippage. Patient tolerated the placement well.  Verification simulation note:  A fiducial marker was placed within the vaginal cylinder. An AP and lateral film was then obtained through the pelvis area. This documented accurate position of the vaginal cylinder for treatment.  HDR BRACHYTHERAPY TREATMENT  The remote afterloading device was affixed to the vaginal cylinder by catheter. Patient then proceeded to undergo her second high-dose-rate treatment directed at the proximal vagina. The patient was prescribed a dose of 6.0 gray to be delivered to the mucosal surface. Treatment length was 2.5 cm. Patient was treated with 1 channel using 5 dwell positions. Treatment time was 190.4 seconds. Iridium 192 was the high-dose-rate source for  treatment. The patient tolerated the treatment well. After completion of her therapy, a radiation survey was performed documenting return of the iridium source into the [employer] safe.  She reported some mild diarrhea after her first HDR treatment.  No other issues.  To aid in comfort issues the patient did have viscous lidocaine  placed along the perineum and vaginal region prior to cylinder placement and high-dose-rate treatment.   PLAN: Patient will return next week to undergo her third high-dose-rate treatment. ________________________________  Lynwood CHARM Nasuti, PhD, MD   This document serves as a record of services personally performed by Lynwood Nasuti, MD. It was created on his behalf by Reymundo Cartwright, a trained medical scribe. The creation of this record is based on the scribe's personal observations and the provider's statements to them. This document has been checked and approved by the attending provider.  "

## 2024-05-25 NOTE — Telephone Encounter (Signed)
 CALLED PATIENT TO REMIND OF HDR TX. FOR 05-26-24 @ 2 PM, SPOKE WITH PATIENT AND SHE IS AWARE OF THIS TX.

## 2024-05-26 ENCOUNTER — Other Ambulatory Visit: Payer: Self-pay

## 2024-05-26 ENCOUNTER — Ambulatory Visit
Admission: RE | Admit: 2024-05-26 | Discharge: 2024-05-26 | Attending: Radiation Oncology | Admitting: Radiation Oncology

## 2024-05-26 DIAGNOSIS — C539 Malignant neoplasm of cervix uteri, unspecified: Secondary | ICD-10-CM

## 2024-05-26 LAB — RAD ONC ARIA SESSION SUMMARY
Course Elapsed Days: 52
Plan Fractions Treated to Date: 2
Plan Prescribed Dose Per Fraction: 6 Gy
Plan Total Fractions Prescribed: 3
Plan Total Prescribed Dose: 18 Gy
Reference Point Dosage Given to Date: 12 Gy
Reference Point Session Dosage Given: 6 Gy
Session Number: 27

## 2024-05-27 ENCOUNTER — Other Ambulatory Visit (HOSPITAL_COMMUNITY): Payer: Self-pay

## 2024-05-30 ENCOUNTER — Other Ambulatory Visit (HOSPITAL_COMMUNITY)

## 2024-06-01 ENCOUNTER — Ambulatory Visit: Admitting: Radiation Oncology

## 2024-06-02 ENCOUNTER — Other Ambulatory Visit (HOSPITAL_COMMUNITY)

## 2024-06-03 ENCOUNTER — Encounter: Admitting: Family

## 2024-06-08 ENCOUNTER — Ambulatory Visit (HOSPITAL_BASED_OUTPATIENT_CLINIC_OR_DEPARTMENT_OTHER): Admitting: Orthopaedic Surgery

## 2024-06-10 ENCOUNTER — Inpatient Hospital Stay: Admitting: Hematology and Oncology

## 2024-06-10 ENCOUNTER — Inpatient Hospital Stay: Attending: Gynecologic Oncology

## 2024-06-10 ENCOUNTER — Inpatient Hospital Stay

## 2024-06-13 ENCOUNTER — Ambulatory Visit: Admitting: Radiation Oncology

## 2024-07-01 ENCOUNTER — Inpatient Hospital Stay: Admitting: Hematology and Oncology

## 2024-07-01 ENCOUNTER — Inpatient Hospital Stay: Attending: Gynecologic Oncology

## 2024-07-01 ENCOUNTER — Inpatient Hospital Stay

## 2024-07-22 ENCOUNTER — Ambulatory Visit: Admitting: Gynecologic Oncology
